# Patient Record
Sex: Male | Born: 1945
Health system: Southern US, Community
[De-identification: ages and names within clinical notes are randomized; demographics above are authoritative.]

## PROBLEM LIST (undated history)

## (undated) DIAGNOSIS — Z8739 Personal history of other diseases of the musculoskeletal system and connective tissue: Secondary | ICD-10-CM

## (undated) DIAGNOSIS — G4733 Obstructive sleep apnea (adult) (pediatric): Secondary | ICD-10-CM

## (undated) DIAGNOSIS — Z9989 Dependence on other enabling machines and devices: Secondary | ICD-10-CM

## (undated) DIAGNOSIS — I10 Essential (primary) hypertension: Secondary | ICD-10-CM

## (undated) DIAGNOSIS — Z8673 Personal history of transient ischemic attack (TIA), and cerebral infarction without residual deficits: Secondary | ICD-10-CM

## (undated) DIAGNOSIS — A770 Spotted fever due to Rickettsia rickettsii: Secondary | ICD-10-CM

## (undated) DIAGNOSIS — G20A1 Parkinson's disease without dyskinesia, without mention of fluctuations: Secondary | ICD-10-CM

## (undated) DIAGNOSIS — E271 Primary adrenocortical insufficiency: Secondary | ICD-10-CM

## (undated) DIAGNOSIS — E786 Lipoprotein deficiency: Secondary | ICD-10-CM

## (undated) DIAGNOSIS — N451 Epididymitis: Secondary | ICD-10-CM

## (undated) DIAGNOSIS — E039 Hypothyroidism, unspecified: Secondary | ICD-10-CM

## (undated) DIAGNOSIS — G8929 Other chronic pain: Secondary | ICD-10-CM

## (undated) DIAGNOSIS — Z8709 Personal history of other diseases of the respiratory system: Secondary | ICD-10-CM

## (undated) DIAGNOSIS — R972 Elevated prostate specific antigen [PSA]: Secondary | ICD-10-CM

## (undated) DIAGNOSIS — Z9889 Other specified postprocedural states: Secondary | ICD-10-CM

## (undated) DIAGNOSIS — T8859XA Other complications of anesthesia, initial encounter: Secondary | ICD-10-CM

## (undated) DIAGNOSIS — A4902 Methicillin resistant Staphylococcus aureus infection, unspecified site: Secondary | ICD-10-CM

## (undated) DIAGNOSIS — I5032 Chronic diastolic (congestive) heart failure: Secondary | ICD-10-CM

## (undated) DIAGNOSIS — I219 Acute myocardial infarction, unspecified: Secondary | ICD-10-CM

## (undated) DIAGNOSIS — H269 Unspecified cataract: Secondary | ICD-10-CM

## (undated) DIAGNOSIS — I639 Cerebral infarction, unspecified: Secondary | ICD-10-CM

## (undated) DIAGNOSIS — I2510 Atherosclerotic heart disease of native coronary artery without angina pectoris: Secondary | ICD-10-CM

## (undated) DIAGNOSIS — M545 Low back pain, unspecified: Secondary | ICD-10-CM

## (undated) DIAGNOSIS — R112 Nausea with vomiting, unspecified: Secondary | ICD-10-CM

## (undated) DIAGNOSIS — G20 Parkinson's disease: Secondary | ICD-10-CM

## (undated) DIAGNOSIS — N200 Calculus of kidney: Secondary | ICD-10-CM

## (undated) DIAGNOSIS — C6290 Malignant neoplasm of unspecified testis, unspecified whether descended or undescended: Secondary | ICD-10-CM

## (undated) DIAGNOSIS — J189 Pneumonia, unspecified organism: Secondary | ICD-10-CM

## (undated) DIAGNOSIS — N492 Inflammatory disorders of scrotum: Secondary | ICD-10-CM

## (undated) DIAGNOSIS — M1990 Unspecified osteoarthritis, unspecified site: Secondary | ICD-10-CM

## (undated) DIAGNOSIS — T4145XA Adverse effect of unspecified anesthetic, initial encounter: Secondary | ICD-10-CM

## (undated) HISTORY — PX: PROSTATE SURGERY: SHX751

## (undated) HISTORY — DX: Unspecified cataract: H26.9

## (undated) HISTORY — PX: BACK SURGERY: SHX140

## (undated) HISTORY — DX: Cerebral infarction, unspecified: I63.9

## (undated) HISTORY — DX: Malignant neoplasm of unspecified testis, unspecified whether descended or undescended: C62.90

## (undated) HISTORY — PX: JOINT REPLACEMENT: SHX530

## (undated) HISTORY — DX: Methicillin resistant Staphylococcus aureus infection, unspecified site: A49.02

## (undated) HISTORY — DX: Chronic diastolic (congestive) heart failure: I50.32

## (undated) HISTORY — DX: Personal history of transient ischemic attack (TIA), and cerebral infarction without residual deficits: Z86.73

---

## 1987-07-12 HISTORY — PX: SHOULDER ARTHROSCOPY W/ ROTATOR CUFF REPAIR: SHX2400

## 1998-07-11 HISTORY — PX: EYE SURGERY: SHX253

## 1999-05-07 ENCOUNTER — Encounter: Payer: Self-pay | Admitting: Ophthalmology

## 1999-05-07 ENCOUNTER — Ambulatory Visit (HOSPITAL_COMMUNITY): Admission: RE | Admit: 1999-05-07 | Discharge: 1999-05-08 | Payer: Self-pay | Admitting: Ophthalmology

## 1999-07-12 DIAGNOSIS — I219 Acute myocardial infarction, unspecified: Secondary | ICD-10-CM

## 1999-07-12 HISTORY — PX: CIRCUMCISION: SUR203

## 1999-07-12 HISTORY — PX: CARDIAC CATHETERIZATION: SHX172

## 1999-07-12 HISTORY — DX: Acute myocardial infarction, unspecified: I21.9

## 2000-02-22 ENCOUNTER — Ambulatory Visit (HOSPITAL_COMMUNITY): Admission: RE | Admit: 2000-02-22 | Discharge: 2000-02-22 | Payer: Self-pay | Admitting: Urology

## 2000-03-04 ENCOUNTER — Emergency Department (HOSPITAL_COMMUNITY): Admission: EM | Admit: 2000-03-04 | Discharge: 2000-03-05 | Payer: Self-pay | Admitting: Emergency Medicine

## 2000-03-20 ENCOUNTER — Inpatient Hospital Stay (HOSPITAL_COMMUNITY): Admission: EM | Admit: 2000-03-20 | Discharge: 2000-03-21 | Payer: Self-pay | Admitting: Emergency Medicine

## 2000-03-20 ENCOUNTER — Encounter: Payer: Self-pay | Admitting: Emergency Medicine

## 2002-07-11 HISTORY — PX: FOOT SURGERY: SHX648

## 2004-07-11 HISTORY — PX: TOTAL KNEE ARTHROPLASTY: SHX125

## 2004-12-07 HISTORY — PX: CYSTOSCOPY: SUR368

## 2006-07-11 HISTORY — PX: LUMBAR DISC SURGERY: SHX700

## 2006-11-15 ENCOUNTER — Emergency Department (HOSPITAL_COMMUNITY): Admission: EM | Admit: 2006-11-15 | Discharge: 2006-11-15 | Payer: Self-pay | Admitting: Emergency Medicine

## 2007-03-18 ENCOUNTER — Emergency Department: Payer: Self-pay | Admitting: Emergency Medicine

## 2008-02-22 ENCOUNTER — Encounter: Admission: RE | Admit: 2008-02-22 | Discharge: 2008-02-22 | Payer: Self-pay | Admitting: Orthopaedic Surgery

## 2009-01-26 ENCOUNTER — Encounter: Admission: RE | Admit: 2009-01-26 | Discharge: 2009-01-26 | Payer: Self-pay | Admitting: Orthopaedic Surgery

## 2010-11-08 ENCOUNTER — Other Ambulatory Visit: Payer: Self-pay | Admitting: Orthopedic Surgery

## 2010-11-08 DIAGNOSIS — M545 Low back pain, unspecified: Secondary | ICD-10-CM

## 2010-11-15 ENCOUNTER — Ambulatory Visit
Admission: RE | Admit: 2010-11-15 | Discharge: 2010-11-15 | Disposition: A | Discharge status: Home / Self Care | Payer: Medicare Other | Source: Ambulatory Visit | Attending: Orthopedic Surgery | Admitting: Orthopedic Surgery

## 2010-11-15 DIAGNOSIS — M545 Low back pain, unspecified: Secondary | ICD-10-CM

## 2010-11-15 MED ORDER — GADOBENATE DIMEGLUMINE 529 MG/ML IV SOLN
20.0000 mL | Freq: Once | INTRAVENOUS | Status: AC | PRN
  Administered 2010-11-15: 20 mL via INTRAVENOUS

## 2010-11-26 NOTE — Op Note (Signed)
[facility]  Patient:    MAAZ, SPIERING                     MRN: [id number] Proc. Date: 02/22/00 Adm. Date:  [id number] Attending:  Evlyn Clines CC:         Arvella Merles, M.D.   Operative Report  PREOPERATIVE DIAGNOSIS:  Phimosis.  POSTOPERATIVE DIAGNOSIS:  Phimosis.  OPERATION PERFORMED:  Circumcision.  SURGEON:  Excell Seltzer. Annabell Howells, M.D.  ANESTHESIA:  Local and MAC.  COMPLICATIONS:  None.  BRIEF HISTORY:  Mr. Valladolid is a 65 year old white male who presented with phimosis.  He has elected circumcision.  DESCRIPTION OF PROCEDURE:  The patient was taken to the operating room where he was placed on the table in the supine position.  His genitalia was prepped with Betadine solution and he was draped in the usual sterile fashion.  A local block of the penis was performed using 20 cc of 50%, 1% lidocaine and 0.25% Marcaine.  He was given IV sedation as needed.  A sleeve reduction of the redundant foreskin was performed.  There was a little frenula chordee that required lysis as well.  Once the redundant skin had been excised, the frenular area was closed using a figure-of-eight mattress suture.  Hemostasis was then assured.  The Bovie was used to cauterize several small blood vessels.  The skin edges were then reapproximated using a running 4-0 chromic.  Once the circumcision had been completed, inspection revealed excellent residual skin.  The penis was cleansed.  A dressing of Xeroform, 4 x 4 and Coban was applied.  The patient was taken to the recovery room in stable condition.  There were no complications. DD:  02/22/00 TD:  02/22/00 Job: [id number]

## 2010-11-26 NOTE — Cardiovascular Report (Signed)
[place]  Patient:    Dennis [name], Dennis [name]                     MRN: [id number] Proc. Date: 03/20/00 Adm. Date:  [id number] Attending:  Lenoria Farrier CC:         Arvella Merles, M.D.  Cecil Cranker, M.D. [facility]  Cardiopulmonary Laboratory   Cardiac Catheterization  CLINICAL HISTORY:  Mr. 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 He has no prior history of known heart disease but does have a history of hypertension, hyperlipidemia and a positive family history for coronary disease.  He was awoken at 3 a.m. with substernal chest pain associated with some nausea.  He finally called the fire department and came to the emergency room by EMS.  The pain was relived after three nitroglycerin en route by EMS. His initial ECG showed minimal ST-T change and his initial CKs and troponins were negative.  DESCRIPTION OF PROCEDURE:  The procedure was performed via the right femoral artery using an arterial sheath and 6 French preformed coronary catheters.  A front wall arterial puncture was performed and Omnipaque contrast was used.  A distal aortogram was performed to rule out abdominal aortic aneurysm. At the completion of the procedure, we attempted to close the right femoral artery with Perclose but were unable to get the suture to hold.  We cut the suture as deep as possible and used manual pressure.  The patient tolerated the procedure well and left the laboratory in satisfactory condition. Ancef was given at the end of the procedure for infection prophylaxis with Perclose.  RESULTS:  The left main coronary artery:  The left main coronary artery was free of significant disease.  Left anterior descending:  The left anterior descending artery gave rise to an optimal diagonal branch and a second diagonal branch and septal perforators. There was 50-70%  narrowing after the second diagonal branch.  The rest of the vessel was irregularities but there was no significant obstruction.  Circumflex artery:  The circumflex artery gave rise to a marginal branch and two posterolateral branches.  These vessels were free of significant disease.  Right coronary artery:  The right coronary gave rise to two right ventricular branches, and two posterior descending branches.  There were tandem 40% lesions in the mid right coronary artery.  LEFT VENTRICULOGRAM:  The left ventriculogram performed in the RAO projection showed good wall motion with no areas of hypokinesis.  The estimated ejection fraction was 60%.  LEFT VENTRICULOGRAM:  A left ventriculogram performed in the LAO projection showed good wall motion with no areas of hypokinesis.  DISTAL AORTOGRAM:  The distal aortogram showed no significant aortoiliac obstruction.  The ostium of the renal arteries was not well-visualized.  CONCLUSIONS:  Nonobstructive coronary artery disease with 50-70% narrowing in the proximal and the mid left anterior descending and tandem 40% stenosis in the mid right coronary artery with normal endothelia dysfunction or spasm.  We will plan to treat him with Altace and nitrates and aspirin and secondary prevention.  We will keep him today and plan discharge tomorrow. DD:  03/20/00 TD:  03/20/00 Job: [id number]

## 2011-05-03 ENCOUNTER — Ambulatory Visit: Payer: Self-pay | Admitting: Gastroenterology

## 2011-05-03 LAB — HM COLONOSCOPY

## 2011-05-06 LAB — PATHOLOGY REPORT

## 2011-05-18 ENCOUNTER — Ambulatory Visit: Payer: Self-pay | Admitting: Internal Medicine

## 2011-05-19 ENCOUNTER — Inpatient Hospital Stay: Payer: Self-pay | Admitting: Specialist

## 2011-05-26 ENCOUNTER — Ambulatory Visit: Payer: Self-pay | Admitting: Internal Medicine

## 2011-05-26 ENCOUNTER — Other Ambulatory Visit: Payer: Self-pay | Admitting: Specialist

## 2011-06-24 ENCOUNTER — Emergency Department: Payer: Self-pay | Admitting: Unknown Physician Specialty

## 2011-08-03 DIAGNOSIS — G4730 Sleep apnea, unspecified: Secondary | ICD-10-CM | POA: Diagnosis not present

## 2011-08-03 DIAGNOSIS — G4710 Hypersomnia, unspecified: Secondary | ICD-10-CM | POA: Diagnosis not present

## 2011-08-08 DIAGNOSIS — N529 Male erectile dysfunction, unspecified: Secondary | ICD-10-CM | POA: Diagnosis not present

## 2011-08-08 DIAGNOSIS — N390 Urinary tract infection, site not specified: Secondary | ICD-10-CM | POA: Diagnosis not present

## 2011-08-08 DIAGNOSIS — N401 Enlarged prostate with lower urinary tract symptoms: Secondary | ICD-10-CM | POA: Diagnosis not present

## 2011-08-08 DIAGNOSIS — E291 Testicular hypofunction: Secondary | ICD-10-CM | POA: Diagnosis not present

## 2011-08-11 DIAGNOSIS — M25569 Pain in unspecified knee: Secondary | ICD-10-CM | POA: Diagnosis not present

## 2011-08-11 DIAGNOSIS — M25469 Effusion, unspecified knee: Secondary | ICD-10-CM | POA: Diagnosis not present

## 2011-08-11 DIAGNOSIS — M1710 Unilateral primary osteoarthritis, unspecified knee: Secondary | ICD-10-CM | POA: Diagnosis not present

## 2011-08-16 DIAGNOSIS — M1710 Unilateral primary osteoarthritis, unspecified knee: Secondary | ICD-10-CM | POA: Diagnosis not present

## 2011-08-16 DIAGNOSIS — N3941 Urge incontinence: Secondary | ICD-10-CM | POA: Diagnosis not present

## 2011-08-16 DIAGNOSIS — N401 Enlarged prostate with lower urinary tract symptoms: Secondary | ICD-10-CM | POA: Diagnosis not present

## 2011-08-16 DIAGNOSIS — N35919 Unspecified urethral stricture, male, unspecified site: Secondary | ICD-10-CM | POA: Diagnosis not present

## 2011-08-16 DIAGNOSIS — N390 Urinary tract infection, site not specified: Secondary | ICD-10-CM | POA: Diagnosis not present

## 2011-09-19 DIAGNOSIS — J209 Acute bronchitis, unspecified: Secondary | ICD-10-CM | POA: Diagnosis not present

## 2011-09-19 DIAGNOSIS — J019 Acute sinusitis, unspecified: Secondary | ICD-10-CM | POA: Diagnosis not present

## 2011-11-01 DIAGNOSIS — IMO0002 Reserved for concepts with insufficient information to code with codable children: Secondary | ICD-10-CM | POA: Diagnosis not present

## 2011-11-01 DIAGNOSIS — M545 Low back pain, unspecified: Secondary | ICD-10-CM | POA: Diagnosis not present

## 2011-11-02 DIAGNOSIS — E782 Mixed hyperlipidemia: Secondary | ICD-10-CM | POA: Diagnosis not present

## 2011-11-02 DIAGNOSIS — F329 Major depressive disorder, single episode, unspecified: Secondary | ICD-10-CM | POA: Diagnosis not present

## 2011-11-02 DIAGNOSIS — I10 Essential (primary) hypertension: Secondary | ICD-10-CM | POA: Diagnosis not present

## 2011-11-02 DIAGNOSIS — M109 Gout, unspecified: Secondary | ICD-10-CM | POA: Diagnosis not present

## 2011-11-02 DIAGNOSIS — E039 Hypothyroidism, unspecified: Secondary | ICD-10-CM | POA: Diagnosis not present

## 2011-11-02 DIAGNOSIS — G4710 Hypersomnia, unspecified: Secondary | ICD-10-CM | POA: Diagnosis not present

## 2011-11-09 DIAGNOSIS — G4710 Hypersomnia, unspecified: Secondary | ICD-10-CM | POA: Diagnosis not present

## 2011-11-15 DIAGNOSIS — IMO0002 Reserved for concepts with insufficient information to code with codable children: Secondary | ICD-10-CM | POA: Diagnosis not present

## 2011-11-15 DIAGNOSIS — M545 Low back pain, unspecified: Secondary | ICD-10-CM | POA: Diagnosis not present

## 2011-11-19 DIAGNOSIS — Z91013 Allergy to seafood: Secondary | ICD-10-CM | POA: Diagnosis not present

## 2011-11-19 DIAGNOSIS — Z91038 Other insect allergy status: Secondary | ICD-10-CM | POA: Diagnosis not present

## 2011-11-19 DIAGNOSIS — I2510 Atherosclerotic heart disease of native coronary artery without angina pectoris: Secondary | ICD-10-CM | POA: Diagnosis not present

## 2011-11-19 DIAGNOSIS — F329 Major depressive disorder, single episode, unspecified: Secondary | ICD-10-CM | POA: Diagnosis not present

## 2011-11-19 DIAGNOSIS — T781XXA Other adverse food reactions, not elsewhere classified, initial encounter: Secondary | ICD-10-CM | POA: Diagnosis not present

## 2011-11-19 DIAGNOSIS — E039 Hypothyroidism, unspecified: Secondary | ICD-10-CM | POA: Diagnosis not present

## 2011-11-19 DIAGNOSIS — L272 Dermatitis due to ingested food: Secondary | ICD-10-CM | POA: Diagnosis not present

## 2011-11-19 DIAGNOSIS — I252 Old myocardial infarction: Secondary | ICD-10-CM | POA: Diagnosis not present

## 2011-11-19 DIAGNOSIS — F411 Generalized anxiety disorder: Secondary | ICD-10-CM | POA: Diagnosis not present

## 2011-11-19 DIAGNOSIS — R220 Localized swelling, mass and lump, head: Secondary | ICD-10-CM | POA: Diagnosis not present

## 2011-11-19 DIAGNOSIS — I10 Essential (primary) hypertension: Secondary | ICD-10-CM | POA: Diagnosis not present

## 2011-12-23 DIAGNOSIS — N35919 Unspecified urethral stricture, male, unspecified site: Secondary | ICD-10-CM | POA: Diagnosis not present

## 2011-12-23 DIAGNOSIS — N401 Enlarged prostate with lower urinary tract symptoms: Secondary | ICD-10-CM | POA: Diagnosis not present

## 2011-12-23 DIAGNOSIS — N3941 Urge incontinence: Secondary | ICD-10-CM | POA: Diagnosis not present

## 2011-12-23 DIAGNOSIS — R351 Nocturia: Secondary | ICD-10-CM | POA: Diagnosis not present

## 2011-12-23 DIAGNOSIS — N390 Urinary tract infection, site not specified: Secondary | ICD-10-CM | POA: Diagnosis not present

## 2011-12-24 ENCOUNTER — Emergency Department: Payer: Self-pay | Admitting: Emergency Medicine

## 2011-12-24 DIAGNOSIS — S99929A Unspecified injury of unspecified foot, initial encounter: Secondary | ICD-10-CM | POA: Diagnosis not present

## 2011-12-24 DIAGNOSIS — N3000 Acute cystitis without hematuria: Secondary | ICD-10-CM | POA: Diagnosis not present

## 2011-12-24 DIAGNOSIS — M25579 Pain in unspecified ankle and joints of unspecified foot: Secondary | ICD-10-CM | POA: Diagnosis not present

## 2011-12-24 DIAGNOSIS — S8990XA Unspecified injury of unspecified lower leg, initial encounter: Secondary | ICD-10-CM | POA: Diagnosis not present

## 2011-12-24 DIAGNOSIS — N3090 Cystitis, unspecified without hematuria: Secondary | ICD-10-CM | POA: Diagnosis not present

## 2011-12-24 DIAGNOSIS — S93409A Sprain of unspecified ligament of unspecified ankle, initial encounter: Secondary | ICD-10-CM | POA: Diagnosis not present

## 2011-12-24 LAB — URINALYSIS, COMPLETE
Bacteria: NONE SEEN
Bilirubin,UR: NEGATIVE
Glucose,UR: NEGATIVE mg/dL
Ketone: NEGATIVE
Nitrite: NEGATIVE
Ph: 7
Protein: 100
RBC,UR: 370 /HPF
Specific Gravity: 1.014
Squamous Epithelial: NONE SEEN
WBC UR: 646 /HPF

## 2011-12-26 DIAGNOSIS — S96819A Strain of other specified muscles and tendons at ankle and foot level, unspecified foot, initial encounter: Secondary | ICD-10-CM | POA: Diagnosis not present

## 2011-12-26 DIAGNOSIS — M25469 Effusion, unspecified knee: Secondary | ICD-10-CM | POA: Diagnosis not present

## 2011-12-26 DIAGNOSIS — Z79899 Other long term (current) drug therapy: Secondary | ICD-10-CM | POA: Diagnosis not present

## 2011-12-26 DIAGNOSIS — S93409A Sprain of unspecified ligament of unspecified ankle, initial encounter: Secondary | ICD-10-CM | POA: Diagnosis not present

## 2011-12-26 DIAGNOSIS — R079 Chest pain, unspecified: Secondary | ICD-10-CM | POA: Diagnosis not present

## 2011-12-26 DIAGNOSIS — Z8679 Personal history of other diseases of the circulatory system: Secondary | ICD-10-CM | POA: Diagnosis not present

## 2011-12-26 DIAGNOSIS — R0602 Shortness of breath: Secondary | ICD-10-CM | POA: Diagnosis not present

## 2011-12-26 DIAGNOSIS — M7989 Other specified soft tissue disorders: Secondary | ICD-10-CM | POA: Diagnosis not present

## 2011-12-26 DIAGNOSIS — S93499A Sprain of other ligament of unspecified ankle, initial encounter: Secondary | ICD-10-CM | POA: Diagnosis not present

## 2011-12-26 DIAGNOSIS — M79609 Pain in unspecified limb: Secondary | ICD-10-CM | POA: Diagnosis not present

## 2011-12-26 DIAGNOSIS — R9431 Abnormal electrocardiogram [ECG] [EKG]: Secondary | ICD-10-CM | POA: Diagnosis not present

## 2011-12-26 DIAGNOSIS — E785 Hyperlipidemia, unspecified: Secondary | ICD-10-CM | POA: Diagnosis not present

## 2011-12-26 DIAGNOSIS — E039 Hypothyroidism, unspecified: Secondary | ICD-10-CM | POA: Diagnosis not present

## 2011-12-26 DIAGNOSIS — M25569 Pain in unspecified knee: Secondary | ICD-10-CM | POA: Diagnosis not present

## 2011-12-26 DIAGNOSIS — R609 Edema, unspecified: Secondary | ICD-10-CM | POA: Diagnosis not present

## 2011-12-26 DIAGNOSIS — I252 Old myocardial infarction: Secondary | ICD-10-CM | POA: Diagnosis not present

## 2011-12-26 DIAGNOSIS — I517 Cardiomegaly: Secondary | ICD-10-CM | POA: Diagnosis not present

## 2011-12-26 DIAGNOSIS — I10 Essential (primary) hypertension: Secondary | ICD-10-CM | POA: Diagnosis not present

## 2011-12-26 LAB — URINE CULTURE

## 2011-12-30 DIAGNOSIS — M25579 Pain in unspecified ankle and joints of unspecified foot: Secondary | ICD-10-CM | POA: Diagnosis not present

## 2011-12-31 DIAGNOSIS — M25579 Pain in unspecified ankle and joints of unspecified foot: Secondary | ICD-10-CM | POA: Diagnosis not present

## 2012-01-02 DIAGNOSIS — S93409A Sprain of unspecified ligament of unspecified ankle, initial encounter: Secondary | ICD-10-CM | POA: Diagnosis not present

## 2012-01-09 DIAGNOSIS — M25579 Pain in unspecified ankle and joints of unspecified foot: Secondary | ICD-10-CM | POA: Diagnosis not present

## 2012-01-17 DIAGNOSIS — M25579 Pain in unspecified ankle and joints of unspecified foot: Secondary | ICD-10-CM | POA: Diagnosis not present

## 2012-01-19 DIAGNOSIS — M25579 Pain in unspecified ankle and joints of unspecified foot: Secondary | ICD-10-CM | POA: Diagnosis not present

## 2012-01-23 ENCOUNTER — Encounter (HOSPITAL_COMMUNITY): Payer: Self-pay | Admitting: Emergency Medicine

## 2012-01-23 ENCOUNTER — Emergency Department (HOSPITAL_COMMUNITY): Payer: Medicare Other

## 2012-01-23 ENCOUNTER — Emergency Department (HOSPITAL_COMMUNITY)
Admission: EM | Admit: 2012-01-23 | Discharge: 2012-01-23 | Disposition: A | Discharge status: Home / Self Care | Payer: Medicare Other | Attending: Emergency Medicine | Admitting: Emergency Medicine

## 2012-01-23 DIAGNOSIS — IMO0002 Reserved for concepts with insufficient information to code with codable children: Secondary | ICD-10-CM | POA: Diagnosis not present

## 2012-01-23 DIAGNOSIS — M79609 Pain in unspecified limb: Secondary | ICD-10-CM | POA: Insufficient documentation

## 2012-01-23 DIAGNOSIS — M549 Dorsalgia, unspecified: Secondary | ICD-10-CM | POA: Diagnosis not present

## 2012-01-23 DIAGNOSIS — R609 Edema, unspecified: Secondary | ICD-10-CM | POA: Diagnosis not present

## 2012-01-23 DIAGNOSIS — Z9889 Other specified postprocedural states: Secondary | ICD-10-CM | POA: Diagnosis not present

## 2012-01-23 DIAGNOSIS — M5126 Other intervertebral disc displacement, lumbar region: Secondary | ICD-10-CM | POA: Diagnosis not present

## 2012-01-23 DIAGNOSIS — M48061 Spinal stenosis, lumbar region without neurogenic claudication: Secondary | ICD-10-CM | POA: Diagnosis not present

## 2012-01-23 DIAGNOSIS — Z79899 Other long term (current) drug therapy: Secondary | ICD-10-CM | POA: Diagnosis not present

## 2012-01-23 DIAGNOSIS — M4800 Spinal stenosis, site unspecified: Secondary | ICD-10-CM | POA: Diagnosis not present

## 2012-01-23 DIAGNOSIS — M5137 Other intervertebral disc degeneration, lumbosacral region: Secondary | ICD-10-CM | POA: Diagnosis not present

## 2012-01-23 DIAGNOSIS — M545 Low back pain, unspecified: Secondary | ICD-10-CM | POA: Diagnosis not present

## 2012-01-23 DIAGNOSIS — M47817 Spondylosis without myelopathy or radiculopathy, lumbosacral region: Secondary | ICD-10-CM | POA: Diagnosis not present

## 2012-01-23 DIAGNOSIS — R209 Unspecified disturbances of skin sensation: Secondary | ICD-10-CM | POA: Diagnosis not present

## 2012-01-23 DIAGNOSIS — G8929 Other chronic pain: Secondary | ICD-10-CM | POA: Insufficient documentation

## 2012-01-23 LAB — CBC WITH DIFFERENTIAL/PLATELET
Basophils Absolute: 0 10*3/uL (ref 0.0–0.1)
Basophils Relative: 0 % (ref 0–1)
Eosinophils Absolute: 0.3 10*3/uL (ref 0.0–0.7)
Eosinophils Relative: 4 % (ref 0–5)
HCT: 37.8 % — ABNORMAL LOW (ref 39.0–52.0)
Hemoglobin: 12.4 g/dL — ABNORMAL LOW (ref 13.0–17.0)
Lymphocytes Relative: 25 % (ref 12–46)
Lymphs Abs: 1.7 10*3/uL (ref 0.7–4.0)
MCH: 26.7 pg (ref 26.0–34.0)
MCHC: 32.8 g/dL (ref 30.0–36.0)
MCV: 81.5 fL (ref 78.0–100.0)
Monocytes Absolute: 0.4 10*3/uL (ref 0.1–1.0)
Monocytes Relative: 6 % (ref 3–12)
Neutro Abs: 4.3 10*3/uL (ref 1.7–7.7)
Neutrophils Relative %: 64 % (ref 43–77)
Platelets: 222 10*3/uL (ref 150–400)
RBC: 4.64 MIL/uL (ref 4.22–5.81)
RDW: 13.4 % (ref 11.5–15.5)
WBC: 6.8 10*3/uL (ref 4.0–10.5)

## 2012-01-23 LAB — COMPREHENSIVE METABOLIC PANEL
ALT: 12 U/L (ref 0–53)
AST: 14 U/L (ref 0–37)
Albumin: 3.7 g/dL (ref 3.5–5.2)
Alkaline Phosphatase: 60 U/L (ref 39–117)
BUN: 13 mg/dL (ref 6–23)
CO2: 28 mEq/L (ref 19–32)
Calcium: 9.5 mg/dL (ref 8.4–10.5)
Chloride: 100 mEq/L (ref 96–112)
Creatinine, Ser: 1.01 mg/dL (ref 0.50–1.35)
GFR calc Af Amer: 87 mL/min — ABNORMAL LOW (ref >90)
GFR calc non Af Amer: 75 mL/min — ABNORMAL LOW (ref >90)
Glucose, Bld: 96 mg/dL (ref 70–99)
Potassium: 3.5 mEq/L (ref 3.5–5.1)
Sodium: 137 mEq/L (ref 135–145)
Total Bilirubin: 0.5 mg/dL (ref 0.3–1.2)
Total Protein: 7.6 g/dL (ref 6.0–8.3)

## 2012-01-23 LAB — PROTIME-INR
INR: 1.09 (ref 0.00–1.49)
Prothrombin Time: 14.3 seconds (ref 11.6–15.2)

## 2012-01-23 MED ORDER — GADOBENATE DIMEGLUMINE 529 MG/ML IV SOLN
20.0000 mL | Freq: Once | INTRAVENOUS | Status: AC | PRN
  Administered 2012-01-23: 20 mL via INTRAVENOUS

## 2012-01-23 MED ORDER — HYDROMORPHONE HCL PF 1 MG/ML IJ SOLN
1.0000 mg | Freq: Once | INTRAMUSCULAR | Status: AC
  Administered 2012-01-23: 1 mg via INTRAVENOUS
  Filled 2012-01-23: qty 1

## 2012-01-23 MED ORDER — ONDANSETRON HCL 4 MG/2ML IJ SOLN
4.0000 mg | Freq: Once | INTRAMUSCULAR | Status: AC
  Administered 2012-01-23: 4 mg via INTRAVENOUS
  Filled 2012-01-23: qty 2

## 2012-01-23 NOTE — ED Notes (Signed)
Pt returned from MRI °

## 2012-01-23 NOTE — ED Notes (Signed)
Pt c/o increasing pain - unbearable.  VS stable.

## 2012-01-23 NOTE — ED Provider Notes (Signed)
History     CSN: [id number]  Arrival date & time 01/23/12  1805   First MD Initiated Contact with Patient 01/23/12 2042      Chief Complaint  Patient presents with  . Back Pain  . Leg Pain    (Consider location/radiation/quality/duration/timing/severity/associated sxs/prior treatment) HPI This 28 rolled male presents with concerns of decreasing sensation in his lower legs, increasing weakness, indicate instability.  He has a history of degenerative joint disease, as well as prior spinal surgeries, including resection of multiple discs and prior disc herniation. He notes that over the past 2 or 3 weeks he is increased bilateral weakness, greater on the left.  Concurrently he has decreased sensation and an inability to move the toes on the right.  He notes new incontinence, both of fecal and urine.  He denies any ongoing fevers, chills, abdominal pain, chest pain, dyspnea.  He does complain of generalized fatigue.  His symptoms are most pronounced when trying to walk or perform physical therapy.  He is in physical therapy for a prior right foot injury. Past Medical History  Diagnosis Date  . Chronic back pain     History reviewed. No pertinent past surgical history.  History reviewed. No pertinent family history.  History  Substance Use Topics  . Smoking status: Never Smoker   . Smokeless tobacco: Not on file  . Alcohol Use: No      Review of Systems  Constitutional:       Per HPI, otherwise negative  HENT:       Per HPI, otherwise negative  Eyes: Negative.   Respiratory:       Per HPI, otherwise negative  Cardiovascular:       Per HPI, otherwise negative  Gastrointestinal: Negative for vomiting.  Genitourinary: Negative.   Musculoskeletal:       Per HPI, otherwise negative  Skin: Negative.   Neurological: Negative for syncope.    Allergies  Shrimp  Home Medications   Current Outpatient Rx  Name Route Sig Dispense Refill  . ATORVASTATIN CALCIUM 20 MG PO  TABS Oral Take 20 mg by mouth daily.    Marland Kitchen CARVEDILOL 3.125 MG PO TABS Oral Take 3.125 mg by mouth 2 (two) times daily with a meal.    . COLCHICINE 0.6 MG PO TABS Oral Take 0.6 mg by mouth 2 (two) times daily.    Marland Kitchen ESCITALOPRAM OXALATE 20 MG PO TABS Oral Take 20 mg by mouth daily.    Marland Kitchen LEVOTHYROXINE SODIUM 100 MCG PO TABS Oral Take 100 mcg by mouth daily.    Marland Kitchen LEVOTHYROXINE SODIUM 75 MCG PO TABS Oral Take 75 mcg by mouth daily.    Marland Kitchen LISINOPRIL 40 MG PO TABS Oral Take 40 mg by mouth daily.    . OXYCODONE-ACETAMINOPHEN 10-325 MG PO TABS Oral Take 1 tablet by mouth every 4 (four) hours as needed. For pain    . SPIRONOLACTONE 25 MG PO TABS Oral Take 25 mg by mouth daily.      BP 159/86  Pulse 59  Temp 98.5 F (36.9 C) (Oral)  Resp 16  SpO2 100%  Physical Exam  Constitutional: No distress.       Large male resting in bed  HENT:  Head: Normocephalic and atraumatic.  Eyes: Conjunctivae are normal.  Cardiovascular: Normal rate and regular rhythm.   Pulmonary/Chest: Effort normal. No stridor. No respiratory distress.  Abdominal: Soft. He exhibits no distension.  Genitourinary:       Patient has rectal tone  is appropriate with pain on exam.  Musculoskeletal:       Feet:  Neurological:       In the upper extremities, and distorts the patient has appropriate neurovascular status.  In the lower terminates the patient has both decreased strength and sensation in the right greater than the left lower extremity.  He does have appropriate pulses in both lower extremities  Skin: He is not diaphoretic.    ED Course  Procedures (including critical care time)   Labs Reviewed  CBC WITH DIFFERENTIAL  COMPREHENSIVE METABOLIC PANEL  PROTIME-INR   No results found.   No diagnosis found.    MDM   this large 41 rolled male with a history of prior lower back pathology now presents with concerns of ongoing weakness, ataxia, decreased sensation and possible incontinence.  Given the patient's  history, and these new changes, there is concern for cauda equina or other acutely degenerative process.  The patient had an expeditious MRI which was relatively stable compared to one performed 2 months ago.  The patient has a followup capacity with neurosurgery.  He was discharged in stable condition followup tomorrow.  Given these findings, the stable vital signs and is normal laboratory evaluation there is low suspicion for acute current process.    Gerhard Munch, MD 01/24/12 [id number]

## 2012-01-23 NOTE — ED Notes (Signed)
Pt here for MRI of back; pt having increasing pain to lower legs x several weeks with difficulty walking; pt told to come by spinal PCP; pt sts increased numbness and weakness over last several weeks; pt with hx of back problems and cyst on spine

## 2012-01-23 NOTE — ED Notes (Signed)
Pt. C/o of back pain. Pt. Reports L4, L5 discs removed. And has crushed discs above that. Was told today that he has a cyst on his spine as well. Pt. Twisted right ankle over 1 month ago. Has decreased sensory loss and weakness in legs and feet.

## 2012-01-24 ENCOUNTER — Other Ambulatory Visit: Payer: Self-pay | Admitting: Orthopaedic Surgery

## 2012-01-24 DIAGNOSIS — M549 Dorsalgia, unspecified: Secondary | ICD-10-CM

## 2012-01-26 DIAGNOSIS — M545 Low back pain, unspecified: Secondary | ICD-10-CM | POA: Diagnosis not present

## 2012-01-26 DIAGNOSIS — IMO0002 Reserved for concepts with insufficient information to code with codable children: Secondary | ICD-10-CM | POA: Diagnosis not present

## 2012-01-26 DIAGNOSIS — M5126 Other intervertebral disc displacement, lumbar region: Secondary | ICD-10-CM | POA: Diagnosis not present

## 2012-01-26 DIAGNOSIS — M47817 Spondylosis without myelopathy or radiculopathy, lumbosacral region: Secondary | ICD-10-CM | POA: Diagnosis not present

## 2012-01-26 DIAGNOSIS — M79609 Pain in unspecified limb: Secondary | ICD-10-CM | POA: Diagnosis not present

## 2012-01-27 DIAGNOSIS — M5137 Other intervertebral disc degeneration, lumbosacral region: Secondary | ICD-10-CM | POA: Diagnosis not present

## 2012-01-27 DIAGNOSIS — R609 Edema, unspecified: Secondary | ICD-10-CM | POA: Diagnosis not present

## 2012-01-27 DIAGNOSIS — I10 Essential (primary) hypertension: Secondary | ICD-10-CM | POA: Diagnosis not present

## 2012-01-27 DIAGNOSIS — R5381 Other malaise: Secondary | ICD-10-CM | POA: Diagnosis not present

## 2012-01-27 DIAGNOSIS — M109 Gout, unspecified: Secondary | ICD-10-CM | POA: Diagnosis not present

## 2012-01-27 DIAGNOSIS — M545 Low back pain, unspecified: Secondary | ICD-10-CM | POA: Diagnosis not present

## 2012-01-31 DIAGNOSIS — I6350 Cerebral infarction due to unspecified occlusion or stenosis of unspecified cerebral artery: Secondary | ICD-10-CM | POA: Diagnosis not present

## 2012-01-31 DIAGNOSIS — I10 Essential (primary) hypertension: Secondary | ICD-10-CM | POA: Diagnosis not present

## 2012-01-31 DIAGNOSIS — M79609 Pain in unspecified limb: Secondary | ICD-10-CM | POA: Diagnosis not present

## 2012-01-31 DIAGNOSIS — M7989 Other specified soft tissue disorders: Secondary | ICD-10-CM | POA: Diagnosis not present

## 2012-02-02 DIAGNOSIS — M5137 Other intervertebral disc degeneration, lumbosacral region: Secondary | ICD-10-CM | POA: Diagnosis not present

## 2012-02-02 DIAGNOSIS — R609 Edema, unspecified: Secondary | ICD-10-CM | POA: Diagnosis not present

## 2012-02-02 DIAGNOSIS — E039 Hypothyroidism, unspecified: Secondary | ICD-10-CM | POA: Diagnosis not present

## 2012-02-02 DIAGNOSIS — M109 Gout, unspecified: Secondary | ICD-10-CM | POA: Diagnosis not present

## 2012-02-03 DIAGNOSIS — M545 Low back pain, unspecified: Secondary | ICD-10-CM | POA: Diagnosis not present

## 2012-02-03 DIAGNOSIS — IMO0002 Reserved for concepts with insufficient information to code with codable children: Secondary | ICD-10-CM | POA: Diagnosis not present

## 2012-02-07 DIAGNOSIS — M79609 Pain in unspecified limb: Secondary | ICD-10-CM | POA: Diagnosis not present

## 2012-02-07 DIAGNOSIS — M7989 Other specified soft tissue disorders: Secondary | ICD-10-CM | POA: Diagnosis not present

## 2012-02-07 DIAGNOSIS — M48061 Spinal stenosis, lumbar region without neurogenic claudication: Secondary | ICD-10-CM | POA: Diagnosis not present

## 2012-02-07 DIAGNOSIS — IMO0002 Reserved for concepts with insufficient information to code with codable children: Secondary | ICD-10-CM | POA: Diagnosis not present

## 2012-02-13 DIAGNOSIS — I2510 Atherosclerotic heart disease of native coronary artery without angina pectoris: Secondary | ICD-10-CM | POA: Diagnosis not present

## 2012-02-13 DIAGNOSIS — G4733 Obstructive sleep apnea (adult) (pediatric): Secondary | ICD-10-CM | POA: Diagnosis not present

## 2012-02-13 DIAGNOSIS — Z01810 Encounter for preprocedural cardiovascular examination: Secondary | ICD-10-CM | POA: Diagnosis not present

## 2012-02-13 DIAGNOSIS — I10 Essential (primary) hypertension: Secondary | ICD-10-CM | POA: Diagnosis not present

## 2012-02-14 DIAGNOSIS — IMO0002 Reserved for concepts with insufficient information to code with codable children: Secondary | ICD-10-CM | POA: Diagnosis not present

## 2012-02-21 DIAGNOSIS — M5430 Sciatica, unspecified side: Secondary | ICD-10-CM | POA: Diagnosis not present

## 2012-02-21 DIAGNOSIS — R0789 Other chest pain: Secondary | ICD-10-CM | POA: Diagnosis not present

## 2012-02-21 DIAGNOSIS — Z01811 Encounter for preprocedural respiratory examination: Secondary | ICD-10-CM | POA: Diagnosis not present

## 2012-02-21 DIAGNOSIS — Z01818 Encounter for other preprocedural examination: Secondary | ICD-10-CM | POA: Diagnosis not present

## 2012-02-22 ENCOUNTER — Other Ambulatory Visit: Payer: Self-pay | Admitting: Neurological Surgery

## 2012-02-23 ENCOUNTER — Encounter (HOSPITAL_COMMUNITY): Payer: Self-pay | Admitting: Respiratory Therapy

## 2012-03-02 ENCOUNTER — Ambulatory Visit (HOSPITAL_COMMUNITY)
Admission: RE | Admit: 2012-03-02 | Discharge: 2012-03-02 | Disposition: A | Discharge status: Home / Self Care | Payer: Medicare Other | Source: Ambulatory Visit | Attending: Neurological Surgery | Admitting: Neurological Surgery

## 2012-03-02 ENCOUNTER — Encounter (HOSPITAL_COMMUNITY): Payer: Self-pay

## 2012-03-02 ENCOUNTER — Encounter (HOSPITAL_COMMUNITY)
Admission: RE | Admit: 2012-03-02 | Discharge: 2012-03-02 | Disposition: A | Discharge status: Home / Self Care | Payer: Medicare Other | Source: Ambulatory Visit | Attending: Neurological Surgery | Admitting: Neurological Surgery

## 2012-03-02 DIAGNOSIS — Z01812 Encounter for preprocedural laboratory examination: Secondary | ICD-10-CM | POA: Diagnosis not present

## 2012-03-02 DIAGNOSIS — Z01818 Encounter for other preprocedural examination: Secondary | ICD-10-CM | POA: Insufficient documentation

## 2012-03-02 DIAGNOSIS — Z01811 Encounter for preprocedural respiratory examination: Secondary | ICD-10-CM | POA: Diagnosis not present

## 2012-03-02 HISTORY — DX: Other complications of anesthesia, initial encounter: T88.59XA

## 2012-03-02 HISTORY — DX: Acute myocardial infarction, unspecified: I21.9

## 2012-03-02 HISTORY — DX: Unspecified osteoarthritis, unspecified site: M19.90

## 2012-03-02 HISTORY — DX: Essential (primary) hypertension: I10

## 2012-03-02 HISTORY — DX: Nausea with vomiting, unspecified: R11.2

## 2012-03-02 HISTORY — DX: Adverse effect of unspecified anesthetic, initial encounter: T41.45XA

## 2012-03-02 HISTORY — DX: Other specified postprocedural states: Z98.890

## 2012-03-02 HISTORY — DX: Hypothyroidism, unspecified: E03.9

## 2012-03-02 LAB — BASIC METABOLIC PANEL
BUN: 13 mg/dL (ref 6–23)
CO2: 29 mEq/L (ref 19–32)
Calcium: 9.9 mg/dL (ref 8.4–10.5)
Chloride: 99 mEq/L (ref 96–112)
Creatinine, Ser: 1.05 mg/dL (ref 0.50–1.35)
GFR calc Af Amer: 83 mL/min — ABNORMAL LOW (ref >90)
GFR calc non Af Amer: 72 mL/min — ABNORMAL LOW (ref >90)
Glucose, Bld: 113 mg/dL — ABNORMAL HIGH (ref 70–99)
Potassium: 3 mEq/L — ABNORMAL LOW (ref 3.5–5.1)
Sodium: 139 mEq/L (ref 135–145)

## 2012-03-02 LAB — CBC WITH DIFFERENTIAL/PLATELET
Basophils Absolute: 0 10*3/uL (ref 0.0–0.1)
Basophils Relative: 1 % (ref 0–1)
Eosinophils Absolute: 0.3 10*3/uL (ref 0.0–0.7)
Eosinophils Relative: 5 % (ref 0–5)
HCT: 42.6 % (ref 39.0–52.0)
Hemoglobin: 14.3 g/dL (ref 13.0–17.0)
Lymphocytes Relative: 24 % (ref 12–46)
Lymphs Abs: 1.5 10*3/uL (ref 0.7–4.0)
MCH: 27.4 pg (ref 26.0–34.0)
MCHC: 33.6 g/dL (ref 30.0–36.0)
MCV: 81.6 fL (ref 78.0–100.0)
Monocytes Absolute: 0.3 10*3/uL (ref 0.1–1.0)
Monocytes Relative: 5 % (ref 3–12)
Neutro Abs: 4.1 10*3/uL (ref 1.7–7.7)
Neutrophils Relative %: 66 % (ref 43–77)
Platelets: 197 10*3/uL (ref 150–400)
RBC: 5.22 MIL/uL (ref 4.22–5.81)
RDW: 14.1 % (ref 11.5–15.5)
WBC: 6.2 10*3/uL (ref 4.0–10.5)

## 2012-03-02 LAB — SURGICAL PCR SCREEN
MRSA, PCR: NEGATIVE
Staphylococcus aureus: NEGATIVE

## 2012-03-02 LAB — TYPE AND SCREEN
ABO/RH(D): O POS
Antibody Screen: NEGATIVE

## 2012-03-02 LAB — PROTIME-INR
INR: 1.04 (ref 0.00–1.49)
Prothrombin Time: 13.8 seconds (ref 11.6–15.2)

## 2012-03-02 LAB — ABO/RH: ABO/RH(D): O POS

## 2012-03-02 NOTE — Pre-Procedure Instructions (Signed)
20 Dennis [name]  03/02/2012   Your procedure is scheduled on:  03/09/2012  Report to [facility] at 5:30 AM.  Call this number if you have problems the morning of surgery: [phone]   Remember:   Do not eat food or drink liquids:After Midnight.   THURSDAY      Take these medicines the morning of surgery with A SIP OF WATER: Carvedilol, lexapro, synthroid   Do not wear jewelry  Do not wear lotions, powders, or perfumes. You may wear deodorant.   Men may shave face and neck.  Do not bring valuables to the hospital.  Contacts, dentures or bridgework may not be worn into surgery.  Leave suitcase in the car. After surgery it may be brought to your room.  For patients admitted to the hospital, checkout time is 11:00 AM the day of discharge.   Patients discharged the day of surgery will not be allowed to drive home.  Name and phone number of your driver: /w family  Special Instructions: CHG Shower Use Special Wash: 1/2 bottle night before surgery and 1/2 bottle morning of surgery.   Please read over the following fact sheets that you were given: Pain Booklet, Coughing and Deep Breathing, Blood Transfusion Information, MRSA Information and Surgical Site Infection Prevention

## 2012-03-08 MED ORDER — DEXTROSE 5 % IV SOLN
3.0000 g | INTRAVENOUS | Status: AC
  Administered 2012-03-09: 3 g via INTRAVENOUS
  Filled 2012-03-08: qty 3000

## 2012-03-09 ENCOUNTER — Encounter (HOSPITAL_COMMUNITY): Payer: Self-pay | Admitting: Anesthesiology

## 2012-03-09 ENCOUNTER — Encounter (HOSPITAL_COMMUNITY)
Admission: RE | Disposition: A | Discharge status: Home / Self Care | Payer: Self-pay | Source: Ambulatory Visit | Attending: Neurological Surgery

## 2012-03-09 ENCOUNTER — Encounter (HOSPITAL_COMMUNITY): Payer: Self-pay | Admitting: Neurological Surgery

## 2012-03-09 ENCOUNTER — Encounter (HOSPITAL_COMMUNITY): Payer: Self-pay | Admitting: *Deleted

## 2012-03-09 ENCOUNTER — Inpatient Hospital Stay (HOSPITAL_COMMUNITY): Payer: Medicare Other | Admitting: Anesthesiology

## 2012-03-09 ENCOUNTER — Inpatient Hospital Stay (HOSPITAL_COMMUNITY)
Admission: RE | Admit: 2012-03-09 | Discharge: 2012-03-11 | DRG: 455 | Disposition: A | Discharge status: Home / Self Care | Payer: Medicare Other | Source: Ambulatory Visit | Attending: Neurological Surgery | Admitting: Neurological Surgery

## 2012-03-09 ENCOUNTER — Inpatient Hospital Stay (HOSPITAL_COMMUNITY): Payer: Medicare Other

## 2012-03-09 DIAGNOSIS — M47817 Spondylosis without myelopathy or radiculopathy, lumbosacral region: Secondary | ICD-10-CM | POA: Diagnosis not present

## 2012-03-09 DIAGNOSIS — G8929 Other chronic pain: Secondary | ICD-10-CM | POA: Diagnosis present

## 2012-03-09 DIAGNOSIS — F329 Major depressive disorder, single episode, unspecified: Secondary | ICD-10-CM | POA: Diagnosis present

## 2012-03-09 DIAGNOSIS — I252 Old myocardial infarction: Secondary | ICD-10-CM

## 2012-03-09 DIAGNOSIS — Z79899 Other long term (current) drug therapy: Secondary | ICD-10-CM

## 2012-03-09 DIAGNOSIS — M961 Postlaminectomy syndrome, not elsewhere classified: Secondary | ICD-10-CM | POA: Diagnosis not present

## 2012-03-09 DIAGNOSIS — M7130 Other bursal cyst, unspecified site: Secondary | ICD-10-CM | POA: Diagnosis present

## 2012-03-09 DIAGNOSIS — Z96659 Presence of unspecified artificial knee joint: Secondary | ICD-10-CM | POA: Diagnosis not present

## 2012-03-09 DIAGNOSIS — M5126 Other intervertebral disc displacement, lumbar region: Principal | ICD-10-CM | POA: Diagnosis present

## 2012-03-09 DIAGNOSIS — I10 Essential (primary) hypertension: Secondary | ICD-10-CM | POA: Diagnosis present

## 2012-03-09 DIAGNOSIS — M5137 Other intervertebral disc degeneration, lumbosacral region: Secondary | ICD-10-CM | POA: Diagnosis not present

## 2012-03-09 DIAGNOSIS — M48061 Spinal stenosis, lumbar region without neurogenic claudication: Secondary | ICD-10-CM | POA: Diagnosis not present

## 2012-03-09 DIAGNOSIS — M51379 Other intervertebral disc degeneration, lumbosacral region without mention of lumbar back pain or lower extremity pain: Secondary | ICD-10-CM | POA: Diagnosis present

## 2012-03-09 DIAGNOSIS — G4730 Sleep apnea, unspecified: Secondary | ICD-10-CM | POA: Diagnosis present

## 2012-03-09 DIAGNOSIS — F3289 Other specified depressive episodes: Secondary | ICD-10-CM | POA: Diagnosis present

## 2012-03-09 DIAGNOSIS — Z8673 Personal history of transient ischemic attack (TIA), and cerebral infarction without residual deficits: Secondary | ICD-10-CM | POA: Diagnosis not present

## 2012-03-09 DIAGNOSIS — E039 Hypothyroidism, unspecified: Secondary | ICD-10-CM | POA: Diagnosis present

## 2012-03-09 DIAGNOSIS — Z981 Arthrodesis status: Secondary | ICD-10-CM | POA: Diagnosis not present

## 2012-03-09 HISTORY — DX: Personal history of other diseases of the musculoskeletal system and connective tissue: Z87.39

## 2012-03-09 HISTORY — PX: ANTERIOR LAT LUMBAR FUSION: SHX1168

## 2012-03-09 HISTORY — DX: Obstructive sleep apnea (adult) (pediatric): G47.33

## 2012-03-09 HISTORY — DX: Low back pain: M54.5

## 2012-03-09 HISTORY — PX: POSTERIOR FUSION LUMBAR SPINE: SUR632

## 2012-03-09 HISTORY — DX: Personal history of other diseases of the respiratory system: Z87.09

## 2012-03-09 HISTORY — DX: Other chronic pain: G89.29

## 2012-03-09 HISTORY — DX: Pneumonia, unspecified organism: J18.9

## 2012-03-09 HISTORY — DX: Low back pain, unspecified: M54.50

## 2012-03-09 HISTORY — DX: Dependence on other enabling machines and devices: Z99.89

## 2012-03-09 SURGERY — ANTERIOR LATERAL LUMBAR FUSION 1 LEVEL
Anesthesia: General | Site: Spine Lumbar | Wound class: Clean

## 2012-03-09 MED ORDER — EPHEDRINE SULFATE 50 MG/ML IJ SOLN
INTRAMUSCULAR | Status: DC | PRN
  Administered 2012-03-09 (×4): 10 mg via INTRAVENOUS
  Administered 2012-03-09 (×2): 5 mg via INTRAVENOUS

## 2012-03-09 MED ORDER — SODIUM CHLORIDE 0.9 % IV SOLN
INTRAVENOUS | Status: AC
  Filled 2012-03-09: qty 500

## 2012-03-09 MED ORDER — HEMOSTATIC AGENTS (NO CHARGE) OPTIME
TOPICAL | Status: DC | PRN
  Administered 2012-03-09: 1 via TOPICAL

## 2012-03-09 MED ORDER — THROMBIN 5000 UNITS EX SOLR
CUTANEOUS | Status: DC | PRN
  Administered 2012-03-09 (×2): 5000 [IU] via TOPICAL

## 2012-03-09 MED ORDER — BACITRACIN 50000 UNITS IM SOLR
INTRAMUSCULAR | Status: AC
  Filled 2012-03-09: qty 1

## 2012-03-09 MED ORDER — PHENOL 1.4 % MT LIQD: 1.0000 | OROMUCOSAL | Status: DC | PRN

## 2012-03-09 MED ORDER — SODIUM CHLORIDE 0.9 % IV SOLN: 250.0000 mL | INTRAVENOUS | Status: DC

## 2012-03-09 MED ORDER — LEVOTHYROXINE SODIUM 150 MCG PO TABS
300.0000 ug | ORAL_TABLET | Freq: Every day | ORAL | Status: DC
  Administered 2012-03-10 – 2012-03-11 (×2): 300 ug via ORAL
  Filled 2012-03-09 (×4): qty 2

## 2012-03-09 MED ORDER — ZOLPIDEM TARTRATE 5 MG PO TABS: 5.0000 mg | ORAL_TABLET | Freq: Every evening | ORAL | Status: DC | PRN

## 2012-03-09 MED ORDER — ACETAMINOPHEN 325 MG PO TABS: 650.0000 mg | ORAL_TABLET | ORAL | Status: DC | PRN

## 2012-03-09 MED ORDER — LIDOCAINE HCL (CARDIAC) 20 MG/ML IV SOLN
INTRAVENOUS | Status: DC | PRN
  Administered 2012-03-09: 100 mg via INTRAVENOUS

## 2012-03-09 MED ORDER — DEXAMETHASONE SODIUM PHOSPHATE 4 MG/ML IJ SOLN
4.0000 mg | Freq: Four times a day (QID) | INTRAMUSCULAR | Status: DC
  Administered 2012-03-09: 4 mg via INTRAVENOUS
  Filled 2012-03-09 (×9): qty 1

## 2012-03-09 MED ORDER — NEOSTIGMINE METHYLSULFATE 1 MG/ML IJ SOLN
INTRAMUSCULAR | Status: DC | PRN
  Administered 2012-03-09: 5 mg via INTRAVENOUS

## 2012-03-09 MED ORDER — SUCCINYLCHOLINE CHLORIDE 20 MG/ML IJ SOLN
INTRAMUSCULAR | Status: DC | PRN
Start: 2012-03-09 — End: 2012-03-09
  Administered 2012-03-09: 200 mg via INTRAVENOUS

## 2012-03-09 MED ORDER — SODIUM CHLORIDE 0.9 % IR SOLN
Status: DC | PRN
  Administered 2012-03-09 (×2)

## 2012-03-09 MED ORDER — HYDROMORPHONE HCL PF 1 MG/ML IJ SOLN
INTRAMUSCULAR | Status: AC
  Administered 2012-03-09: 0.5 mg via INTRAVENOUS
  Filled 2012-03-09: qty 1

## 2012-03-09 MED ORDER — POTASSIUM CHLORIDE IN NACL 20-0.9 MEQ/L-% IV SOLN
INTRAVENOUS | Status: DC
  Administered 2012-03-09: 16:00:00 via INTRAVENOUS
  Filled 2012-03-09 (×6): qty 1000

## 2012-03-09 MED ORDER — CYCLOBENZAPRINE HCL 10 MG PO TABS: 10.0000 mg | ORAL_TABLET | Freq: Three times a day (TID) | ORAL | Status: DC | PRN

## 2012-03-09 MED ORDER — SENNA 8.6 MG PO TABS
1.0000 | ORAL_TABLET | Freq: Two times a day (BID) | ORAL | Status: DC
  Administered 2012-03-10: 8.6 mg via ORAL
  Filled 2012-03-09 (×4): qty 1

## 2012-03-09 MED ORDER — BUPIVACAINE HCL (PF) 0.25 % IJ SOLN
INTRAMUSCULAR | Status: DC | PRN
  Administered 2012-03-09: 4 mL
  Administered 2012-03-09: 3 mL

## 2012-03-09 MED ORDER — LIDOCAINE HCL 4 % MT SOLN
OROMUCOSAL | Status: DC | PRN
  Administered 2012-03-09: 4 mL via TOPICAL

## 2012-03-09 MED ORDER — MENTHOL 3 MG MT LOZG: 1.0000 | LOZENGE | OROMUCOSAL | Status: DC | PRN

## 2012-03-09 MED ORDER — MORPHINE SULFATE 2 MG/ML IJ SOLN
1.0000 mg | INTRAMUSCULAR | Status: DC | PRN
  Administered 2012-03-09: 2 mg via INTRAVENOUS
  Filled 2012-03-09: qty 1

## 2012-03-09 MED ORDER — DEXAMETHASONE SODIUM PHOSPHATE 10 MG/ML IJ SOLN: 10.0000 mg | INTRAMUSCULAR | Status: DC

## 2012-03-09 MED ORDER — DEXAMETHASONE 4 MG PO TABS
4.0000 mg | ORAL_TABLET | Freq: Four times a day (QID) | ORAL | Status: DC
  Administered 2012-03-10 – 2012-03-11 (×7): 4 mg via ORAL
  Filled 2012-03-09 (×11): qty 1

## 2012-03-09 MED ORDER — SODIUM CHLORIDE 0.9 % IJ SOLN
3.0000 mL | Freq: Two times a day (BID) | INTRAMUSCULAR | Status: DC
  Administered 2012-03-10: 3 mL via INTRAVENOUS

## 2012-03-09 MED ORDER — ACETAMINOPHEN 650 MG RE SUPP: 650.0000 mg | RECTAL | Status: DC | PRN

## 2012-03-09 MED ORDER — ONDANSETRON HCL 4 MG/2ML IJ SOLN: 4.0000 mg | INTRAMUSCULAR | Status: DC | PRN

## 2012-03-09 MED ORDER — CEFAZOLIN SODIUM 1-5 GM-% IV SOLN
1.0000 g | Freq: Three times a day (TID) | INTRAVENOUS | Status: AC
  Administered 2012-03-09 – 2012-03-10 (×2): 1 g via INTRAVENOUS
  Filled 2012-03-09 (×2): qty 50

## 2012-03-09 MED ORDER — FUROSEMIDE 20 MG PO TABS
20.0000 mg | ORAL_TABLET | Freq: Two times a day (BID) | ORAL | Status: DC
  Administered 2012-03-09 – 2012-03-11 (×4): 20 mg via ORAL
  Filled 2012-03-09 (×7): qty 1

## 2012-03-09 MED ORDER — GLYCOPYRROLATE 0.2 MG/ML IJ SOLN
INTRAMUSCULAR | Status: DC | PRN
  Administered 2012-03-09: 0.6 mg via INTRAVENOUS
  Administered 2012-03-09: 0.2 mg via INTRAVENOUS

## 2012-03-09 MED ORDER — ONDANSETRON HCL 4 MG/2ML IJ SOLN
INTRAMUSCULAR | Status: DC | PRN
  Administered 2012-03-09: 4 mg via INTRAVENOUS

## 2012-03-09 MED ORDER — PHENYLEPHRINE HCL 10 MG/ML IJ SOLN
INTRAMUSCULAR | Status: DC | PRN
  Administered 2012-03-09: 40 ug via INTRAVENOUS
  Administered 2012-03-09: 80 ug via INTRAVENOUS
  Administered 2012-03-09: 40 ug via INTRAVENOUS
  Administered 2012-03-09: 80 ug via INTRAVENOUS
  Administered 2012-03-09 (×2): 40 ug via INTRAVENOUS
  Administered 2012-03-09 (×3): 80 ug via INTRAVENOUS
  Administered 2012-03-09: 40 ug via INTRAVENOUS
  Administered 2012-03-09: 80 ug via INTRAVENOUS
  Administered 2012-03-09: 40 ug via INTRAVENOUS

## 2012-03-09 MED ORDER — CARVEDILOL 3.125 MG PO TABS
3.1250 mg | ORAL_TABLET | Freq: Two times a day (BID) | ORAL | Status: DC
  Administered 2012-03-10 – 2012-03-11 (×3): 3.125 mg via ORAL
  Filled 2012-03-09 (×6): qty 1

## 2012-03-09 MED ORDER — SODIUM CHLORIDE 0.9 % IJ SOLN: 3.0000 mL | INTRAMUSCULAR | Status: DC | PRN

## 2012-03-09 MED ORDER — ESCITALOPRAM OXALATE 20 MG PO TABS
20.0000 mg | ORAL_TABLET | Freq: Every day | ORAL | Status: DC
  Administered 2012-03-10 – 2012-03-11 (×2): 20 mg via ORAL
  Filled 2012-03-09 (×3): qty 1

## 2012-03-09 MED ORDER — DEXAMETHASONE SODIUM PHOSPHATE 10 MG/ML IJ SOLN
INTRAMUSCULAR | Status: AC
  Administered 2012-03-09: 10 mg via INTRAVENOUS
  Filled 2012-03-09: qty 1

## 2012-03-09 MED ORDER — LISINOPRIL 40 MG PO TABS
40.0000 mg | ORAL_TABLET | Freq: Every day | ORAL | Status: DC
  Administered 2012-03-10 – 2012-03-11 (×2): 40 mg via ORAL
  Filled 2012-03-09 (×3): qty 1

## 2012-03-09 MED ORDER — FENTANYL CITRATE 0.05 MG/ML IJ SOLN
INTRAMUSCULAR | Status: DC | PRN
  Administered 2012-03-09: 50 ug via INTRAVENOUS
  Administered 2012-03-09: 250 ug via INTRAVENOUS

## 2012-03-09 MED ORDER — ACETAMINOPHEN 10 MG/ML IV SOLN
INTRAVENOUS | Status: AC
  Filled 2012-03-09: qty 100

## 2012-03-09 MED ORDER — ALLOPURINOL 100 MG PO TABS
200.0000 mg | ORAL_TABLET | Freq: Every day | ORAL | Status: DC
  Administered 2012-03-10 – 2012-03-11 (×2): 200 mg via ORAL
  Filled 2012-03-09 (×3): qty 2

## 2012-03-09 MED ORDER — ACETAMINOPHEN 10 MG/ML IV SOLN
INTRAVENOUS | Status: DC | PRN
  Administered 2012-03-09: 1000 mg via INTRAVENOUS

## 2012-03-09 MED ORDER — SPIRONOLACTONE 25 MG PO TABS
25.0000 mg | ORAL_TABLET | Freq: Every day | ORAL | Status: DC
Start: 2012-03-10 — End: 2012-03-11
  Administered 2012-03-10 – 2012-03-11 (×2): 25 mg via ORAL
  Filled 2012-03-09 (×3): qty 1

## 2012-03-09 MED ORDER — ONDANSETRON HCL 4 MG/2ML IJ SOLN: 4.0000 mg | Freq: Once | INTRAMUSCULAR | Status: DC | PRN

## 2012-03-09 MED ORDER — 0.9 % SODIUM CHLORIDE (POUR BTL) OPTIME
TOPICAL | Status: DC | PRN
  Administered 2012-03-09 (×2): 1000 mL

## 2012-03-09 MED ORDER — ROCURONIUM BROMIDE 100 MG/10ML IV SOLN
INTRAVENOUS | Status: DC | PRN
  Administered 2012-03-09: 30 mg via INTRAVENOUS

## 2012-03-09 MED ORDER — PROPOFOL 10 MG/ML IV EMUL
INTRAVENOUS | Status: DC | PRN
  Administered 2012-03-09: 330 mg via INTRAVENOUS
  Administered 2012-03-09: 20 mg via INTRAVENOUS

## 2012-03-09 MED ORDER — OXYCODONE-ACETAMINOPHEN 5-325 MG PO TABS: 1.0000 | ORAL_TABLET | ORAL | Status: DC | PRN

## 2012-03-09 MED ORDER — LACTATED RINGERS IV SOLN
INTRAVENOUS | Status: DC | PRN
  Administered 2012-03-09 (×3): via INTRAVENOUS

## 2012-03-09 MED ORDER — ACETAMINOPHEN 10 MG/ML IV SOLN
1000.0000 mg | Freq: Four times a day (QID) | INTRAVENOUS | Status: AC
  Administered 2012-03-09 – 2012-03-10 (×4): 1000 mg via INTRAVENOUS
  Filled 2012-03-09 (×4): qty 100

## 2012-03-09 MED ORDER — HYDROMORPHONE HCL PF 1 MG/ML IJ SOLN
0.2500 mg | INTRAMUSCULAR | Status: DC | PRN
  Administered 2012-03-09 (×3): 0.5 mg via INTRAVENOUS

## 2012-03-09 MED ORDER — MIDAZOLAM HCL 5 MG/5ML IJ SOLN
INTRAMUSCULAR | Status: DC | PRN
  Administered 2012-03-09: 2 mg via INTRAVENOUS

## 2012-03-09 SURGICAL SUPPLY — 79 items
BAG DECANTER FOR FLEXI CONT (MISCELLANEOUS) ×6 IMPLANT
BENZOIN TINCTURE PRP APPL 2/3 (GAUZE/BANDAGES/DRESSINGS) ×3 IMPLANT
BLADE SURG ROTATE 9660 (MISCELLANEOUS) IMPLANT
BONE MATRIX OSTEOCEL PLUS 5CC (Bone Implant) ×3 IMPLANT
CLOTH BEACON ORANGE TIMEOUT ST (SAFETY) ×6 IMPLANT
CONT SPEC 4OZ CLIKSEAL STRL BL (MISCELLANEOUS) ×3 IMPLANT
CORENT WIDE 10X22X55 (Orthopedic Implant) ×3 IMPLANT
COROENT WIDE 10X22X55 (Orthopedic Implant) ×2 IMPLANT
COVER BACK TABLE 24X17X13 BIG (DRAPES) IMPLANT
COVER TABLE BACK 60X90 (DRAPES) ×3 IMPLANT
DERMABOND ADHESIVE PROPEN (GAUZE/BANDAGES/DRESSINGS) ×2
DERMABOND ADVANCED (GAUZE/BANDAGES/DRESSINGS)
DERMABOND ADVANCED .7 DNX12 (GAUZE/BANDAGES/DRESSINGS) IMPLANT
DERMABOND ADVANCED .7 DNX6 (GAUZE/BANDAGES/DRESSINGS) ×4 IMPLANT
DRAPE C-ARM 42X72 X-RAY (DRAPES) ×15 IMPLANT
DRAPE C-ARMOR (DRAPES) ×3 IMPLANT
DRAPE LAPAROTOMY 100X72X124 (DRAPES) ×6 IMPLANT
DRAPE POUCH INSTRU U-SHP 10X18 (DRAPES) ×6 IMPLANT
DRAPE SURG 17X23 STRL (DRAPES) ×18 IMPLANT
DRESSING TELFA 8X3 (GAUZE/BANDAGES/DRESSINGS) ×3 IMPLANT
DRSG OPSITE 4X5.5 SM (GAUZE/BANDAGES/DRESSINGS) ×6 IMPLANT
DURAPREP 26ML APPLICATOR (WOUND CARE) ×6 IMPLANT
ELECT BLADE 4.0 EZ CLEAN MEGAD (MISCELLANEOUS) ×3
ELECT REM PT RETURN 9FT ADLT (ELECTROSURGICAL) ×6
ELECTRODE BLDE 4.0 EZ CLN MEGD (MISCELLANEOUS) ×2 IMPLANT
ELECTRODE REM PT RTRN 9FT ADLT (ELECTROSURGICAL) ×4 IMPLANT
EVACUATOR 1/8 PVC DRAIN (DRAIN) ×3 IMPLANT
GAUZE SPONGE 4X4 16PLY XRAY LF (GAUZE/BANDAGES/DRESSINGS) ×3 IMPLANT
GLOVE BIO SURGEON STRL SZ8 (GLOVE) ×6 IMPLANT
GLOVE BIOGEL PI IND STRL 7.0 (GLOVE) ×6 IMPLANT
GLOVE BIOGEL PI IND STRL 7.5 (GLOVE) ×2 IMPLANT
GLOVE BIOGEL PI IND STRL 8 (GLOVE) ×2 IMPLANT
GLOVE BIOGEL PI INDICATOR 7.0 (GLOVE) ×3
GLOVE BIOGEL PI INDICATOR 7.5 (GLOVE) ×1
GLOVE BIOGEL PI INDICATOR 8 (GLOVE) ×1
GLOVE ECLIPSE 7.5 STRL STRAW (GLOVE) ×3 IMPLANT
GLOVE SS BIOGEL STRL SZ 6.5 (GLOVE) ×6 IMPLANT
GLOVE SS BIOGEL STRL SZ 7 (GLOVE) ×4 IMPLANT
GLOVE SUPERSENSE BIOGEL SZ 6.5 (GLOVE) ×3
GLOVE SUPERSENSE BIOGEL SZ 7 (GLOVE) ×2
GOWN BRE IMP SLV AUR LG STRL (GOWN DISPOSABLE) ×12 IMPLANT
GOWN BRE IMP SLV AUR XL STRL (GOWN DISPOSABLE) ×12 IMPLANT
GOWN STRL REIN 2XL LVL4 (GOWN DISPOSABLE) IMPLANT
GUIDEWIRE NITINOL BEVEL TIP (WIRE) ×6 IMPLANT
HEMOSTAT POWDER KIT SURGIFOAM (HEMOSTASIS) IMPLANT
KIT BASIN OR (CUSTOM PROCEDURE TRAY) ×6 IMPLANT
KIT DILATOR XLIF 5 (KITS) ×2 IMPLANT
KIT MAXCESS (KITS) ×3 IMPLANT
KIT NEEDLE NVM5 EMG ELECT (KITS) ×2 IMPLANT
KIT NEEDLE NVM5 EMG ELECTRODE (KITS) ×1
KIT ROOM TURNOVER OR (KITS) ×3 IMPLANT
KIT XLIF (KITS) ×1
NEEDLE HYPO 22GX1.5 SAFETY (NEEDLE) IMPLANT
NEEDLE HYPO 25X1 1.5 SAFETY (NEEDLE) ×6 IMPLANT
NEEDLE I PASS (NEEDLE) ×3 IMPLANT
NS IRRIG 1000ML POUR BTL (IV SOLUTION) ×6 IMPLANT
PACK LAMINECTOMY NEURO (CUSTOM PROCEDURE TRAY) ×6 IMPLANT
PLATE AFFIX 45MM (Plate) ×3 IMPLANT
PUTTY BONE DBX 5CC MIX (Putty) ×3 IMPLANT
ROD 50MM (Rod) ×3 IMPLANT
SCREW PRECEPT 5.5X50 (Screw) ×3 IMPLANT
SCREW PRECEPT 6.5X50 (Screw) ×3 IMPLANT
SCREW PRECEPT SET (Screw) ×6 IMPLANT
SPONGE GAUZE 4X4 12PLY (GAUZE/BANDAGES/DRESSINGS) IMPLANT
SPONGE LAP 4X18 X RAY DECT (DISPOSABLE) IMPLANT
SPONGE SURGIFOAM ABS GEL SZ50 (HEMOSTASIS) IMPLANT
STAPLER SKIN PROX WIDE 3.9 (STAPLE) ×3 IMPLANT
STRIP CLOSURE SKIN 1/2X4 (GAUZE/BANDAGES/DRESSINGS) ×3 IMPLANT
SUT VIC AB 0 CT1 18XCR BRD8 (SUTURE) ×4 IMPLANT
SUT VIC AB 0 CT1 8-18 (SUTURE) ×2
SUT VIC AB 2-0 CP2 18 (SUTURE) ×6 IMPLANT
SUT VIC AB 3-0 SH 8-18 (SUTURE) ×9 IMPLANT
SWABSTICK BENZOIN STERILE (MISCELLANEOUS) IMPLANT
SYR 20ML ECCENTRIC (SYRINGE) ×6 IMPLANT
TAPE CLOTH 3X10 TAN LF (GAUZE/BANDAGES/DRESSINGS) ×9 IMPLANT
TOWEL OR 17X24 6PK STRL BLUE (TOWEL DISPOSABLE) ×6 IMPLANT
TOWEL OR 17X26 10 PK STRL BLUE (TOWEL DISPOSABLE) ×6 IMPLANT
TRAY FOLEY CATH 14FRSI W/METER (CATHETERS) ×3 IMPLANT
WATER STERILE IRR 1000ML POUR (IV SOLUTION) ×6 IMPLANT

## 2012-03-09 NOTE — Op Note (Signed)
03/09/2012  12:26 PM  PATIENT:  Dennis [name]  66 y.o. male  PRE-OPERATIVE DIAGNOSIS:  1. Degenerative disease with recurrent disc herniation L2-3 status post decompressive laminectomy in the remote past, 2. Lumbar spondylosis with facet arthropathy, annular tear, and synovial cyst L4-5, 3. Back and right leg pain  POST-OPERATIVE DIAGNOSIS:  Same  PROCEDURE:  1. Anterolateral retroperitoneal interbody fusion L2-3 utilizing a 10 lordotic by 22 x 55 mm peek interbody cage packed with morcellized allograft, 2. Posterior nonsegmental fixation with percutaneous pedicle screws L2-3, 3. Decompressive posterior hemilaminectomy, medial facetectomy and foraminotomies L4-5 on the right for decompression of the right L5 nerve root, 4. Posterior interlaminar fusion L4-5 on the left utilizing local autograft and morcellized allograft, 5. Nonsegmental fixation L4-5 utilizing the affix plate  SURGEON:  Marikay Alar, MD  ASSISTANTS: Dr. Newell Coral  ANESTHESIA:   General  EBL: 50 ml  Total I/O In: 2800 [I.V.:2800] Out: 330 [Urine:280; Blood:50]  BLOOD ADMINISTERED:none  DRAINS:  medium Hemovac   SPECIMEN:  No Specimen  INDICATION FOR PROCEDURE: This patient underwent a previous L2-3 decompressed laminectomy by a another surgeon in the remote past. He's had a long history of mechanical back pain since that time. Serial MRI showed progressive spondylosis at L2-3 and L4-5. Recommended an XLIF at L2-3 and a decompressive laminectomy L4-5. Intraoperatively Dr. Newell Coral and I discussed the plan at L4-5. We decided against removal of the synovial cyst and simply doing a right-sided decompression because of the patient's right-sided symptoms. We felt that had some instability at L4-5 and therefore decided to do an onlay fusion as well as interspinous plating. Patient understood the risks, benefits, and alternatives and potential outcomes and wished to proceed.  PROCEDURE DETAILS: The patient was brought to the  operating room and after induction of adequate generalized endotracheal anesthesia, the patient was positioned in the right lateral decubitus position exposing the left side. The patient was positioned in the typical XLIF fashion and taped into position and trajectories were confirmed with AP lateral fluoroscopy. The skin was cleaned and then prepped with DuraPrep and draped in the usual sterile fashion. 5 cc of local anesthesia was injected. A lateral incision was made over the appropriate L2-3 disc space and a incision was made posterior lateral to this. Blunt finger dissection was used to enter the retroperitoneal space. I could palpate the psoas musculature as well as the anterior face of the transverse process, and I could feel the fatty tissues of the retroperitoneum. I swept my finger up to the lateral incision and passed my first dilator down to psoas musculature utilizing my index finger. We then used EMG monitoring to monitor our dilator. We checked our position with fluoroscopy and then placed a K wire into the disc space, and then used sequential dilators until our final retractor was in place. We then positioned it utilizing AP lateral fluoroscopy, and used our ball probe to make sure there were no neural structures within the working channel. I then placed the intradiscal shim, and opened my retractor further. The annulus was then incised and the discectomy was done with pituitaries. The annulus was removed from the endplates with a Cobb and the opposite annulus was opened and released. I then used a series of scrapers and shavers to prepare the endplates, taking care not to violate the endplates. I then placed my 16 mm paddle across the disc space to further open opposite annulus and I checked the trajectory with AP and lateral fluoroscopy. I then  used sequential trials to determine the correct size cage. The 10 lordotic trial fit the best, so a corresponding 10 x 22 x 55 mm peek cage was selected and  packed with morcellized allograft. Using sliders it was then tapped gently into the disc space utilizing AP fluoroscopy until it was appropriately positioned. I then irrigated with saline solution containing bacitracin and dried any bleeding points. I checked my final construct with AP lateral fluoroscopy, removed my retractor, and closed the incisions with layers of 0 Vicryl in the fascia, 2-0 Vicryl in the subcutaneous tissues, and 3-0 Vicryl in the subcuticular tissues. The skin was closed with Dermabond. I then turned my attention to the placement of the percutaneous pedicle screws. AP and lateral fluoroscopy was used for placement. I used AP fluoroscopy to confirm my incision points, made small stab incisions and passed my Jamshidi needle to the lateral edge of the pedicle at both L2 and L3. R. Jamshidi needle was monitored as it was advanced through the pedicle of L2 and L3 to it reached the medial border of the pedicle on the AP image. We then turned lateral fluoroscopy and confirmed that the needle was within the vertebral body and placed our K wires. We were then able to tap over the K wires and then place our pedicle screws over the K wires. A 65 screw was used at L3 and a 55 screws used at L2. We then measured the length of our rod and placed it through the [place] and placed our locking caps and position. Then tightened our locking caps with the antitorque device. The [place] were removed and the final construct was checked with AP and lateral fluoroscopy. The stab wounds were then closed with 0 Vicryl in the fascia, 2-0 Vicryl in the subcutaneous tissues, and 0 Vicryl and the subcuticular tissues. The skin was closed with Dermabond. The drapes were then removed and the patient was turned into the prone position on chest rolls, all pressure points were padded. The lumbar region was then prepped with DuraPrep and draped in usual sterile fashion and 5 cc of local anesthesia was injected. A dorsal midline  incision was made and carried down to the lumbosacral fascia. The fascia was opened and the paraspinous musculature was taken a subperiostel fashion to expose  L4-5. Intraoperative fluoroscopy confirmed my level.  I performed a right hemilaminectomy, medial facetectomy, and foraminotomy to decompress the lateral recess as well as the L5 nerve root. A coronary dilator would pass easily along the nerve root. Inspected the disc space assure no tear in the disc or disc herniation.I was going to do a left-sided decompression also upon further conversation with Dr. Newell Coral decided that the risk of doing a left-sided decompression (CSF leak or a stabilization of the segment ) likely outweigh the potential benefits of removing the synovial cyst. We did not feel that the left-sided synovial cyst was causing right-sided symptoms. We felt it was more important to stabilize this segment because it was felt he had some instability there. Therefore we made the decision to corticate the lamina of L4-L5 on the left and placed a mixture of local autograft and morcellized allograft perform intralaminar fusion. We also  decided to place a interspinous plate to reestablish the posterior tension band. The interspinous ligament was removed, and trials were used to determine the correct size of the interspinous plate. The plate was then squeezed into position at the appropriate level. The posterior elements were decorticated with a high-speed drill and  morcellized allograft was placed in order to perform a posterior fusion. The wound was irrigated, a Hemovac drain was placed, and the wound was then closed in layers of 0 Vicryl in the fascia, 2-0 Vicryl in the subcutaneous tissues, and 3-0 Vicryl in the subcuticular tissues. The skin was closed with benzoin and Steri-Strips. A sterile dressing was applied. The patient was then awakened from general anesthesia and transported to the recovery room in stable condition. At the end of the  procedure all sponge, needle, and instrument counts were correct.   PLAN OF CARE: Admit to inpatient   PATIENT DISPOSITION:  PACU - hemodynamically stable.   Delay start of Pharmacological VTE agent (>24hrs) due to surgical blood loss or risk of bleeding:  yes

## 2012-03-09 NOTE — Anesthesia Postprocedure Evaluation (Signed)
  Anesthesia Post-op Note  Patient: Dennis [name]  Procedure(s) Performed: Procedure(s) (LRB): ANTERIOR LATERAL LUMBAR FUSION 1 LEVEL (Left) LUMBAR PERCUTANEOUS PEDICLE SCREW 1 LEVEL (N/A)  Patient Location: PACU  Anesthesia Type: General  Level of Consciousness: awake, alert  and oriented  Airway and Oxygen Therapy: Patient Spontanous Breathing and Patient connected to nasal cannula oxygen  Post-op Pain: none  Post-op Assessment: Post-op Vital signs reviewed  Post-op Vital Signs: Reviewed  Complications: No apparent anesthesia complications

## 2012-03-09 NOTE — Preoperative (Signed)
Beta Blockers   Reason not to administer Beta Blockers:Not Applicable 
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
## 2012-03-09 NOTE — Anesthesia Preprocedure Evaluation (Addendum)
Anesthesia Evaluation  Patient identified by MRN, date of birth, ID band Patient awake and Patient confused    Reviewed: Allergy & Precautions, H&P , NPO status , Patient's Chart, lab work & pertinent test results, reviewed documented beta blocker date and time   History of Anesthesia Complications (+) PONV  Airway Mallampati: I TM Distance: >3 FB Neck ROM: Full    Dental  (+) Teeth Intact and Dental Advisory Given   Pulmonary sleep apnea and Continuous Positive Airway Pressure Ventilation ,  breath sounds clear to auscultation        Cardiovascular hypertension, Pt. on medications and Pt. on home beta blockers + Past MI (2001) Rhythm:Regular Rate:Normal  Nuclear stress test 3 weeks ago -- clean.   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 CVA, No Residual Symptoms    GI/Hepatic   Endo/Other  Hypothyroidism Morbid obesity  Renal/GU      Musculoskeletal   Abdominal   Peds  Hematology   Anesthesia Other Findings   Reproductive/Obstetrics                          Anesthesia Physical Anesthesia Plan  ASA: III  Anesthesia Plan: General   Post-op Pain Management:    Induction:   Airway Management Planned: Oral ETT  Additional Equipment:   Intra-op Plan:   Post-operative Plan:   Informed Consent: I have reviewed the patients History and Physical, chart, labs and discussed the procedure including the risks, benefits and alternatives for the proposed anesthesia with the patient or authorized representative who has indicated his/her understanding and acceptance.   Dental advisory given  Plan Discussed with: CRNA, Anesthesiologist and Surgeon  Anesthesia Plan Comments:         Anesthesia Quick Evaluation

## 2012-03-09 NOTE — Transfer of Care (Signed)
Immediate Anesthesia Transfer of Care Note  Patient: Dennis [name]  Procedure(s) Performed: Procedure(s) (LRB): ANTERIOR LATERAL LUMBAR FUSION 1 LEVEL (Left) LUMBAR PERCUTANEOUS PEDICLE SCREW 1 LEVEL (N/A)  Patient Location: PACU  Anesthesia Type: General  Level of Consciousness: awake, alert  and patient cooperative  Airway & Oxygen Therapy: Patient Spontanous Breathing and Patient connected to face mask oxygen  Post-op Assessment: Report given to PACU RN and Post -op Vital signs reviewed and stable  Post vital signs: Reviewed  Complications: No apparent anesthesia complications

## 2012-03-09 NOTE — Anesthesia Procedure Notes (Signed)
Procedure Name: Intubation Date/Time: 03/09/2012 7:54 AM Performed by: Lovie Chol Pre-anesthesia Checklist: Patient identified, Emergency Drugs available, Suction available, Patient being monitored and Timeout performed Patient Re-evaluated:Patient Re-evaluated prior to inductionOxygen Delivery Method: Circle system utilized Preoxygenation: Pre-oxygenation with 100% oxygen Intubation Type: IV induction Ventilation: Oral airway inserted - appropriate to patient size and Two handed mask ventilation required Laryngoscope Size: Miller and 3 Grade View: Grade I Tube type: Oral Tube size: 8.0 mm Number of attempts: 1 Airway Equipment and Method: Stylet and LTA kit utilized Placement Confirmation: ETT inserted through vocal cords under direct vision,  positive ETCO2 and breath sounds checked- equal and bilateral Secured at: 22 cm Tube secured with: Tape Dental Injury: Teeth and Oropharynx as per pre-operative assessment

## 2012-03-09 NOTE — OR Nursing (Signed)
Part 1 procedure complete at 1030, part 2 procedure incision at 1051

## 2012-03-10 LAB — GLUCOSE, CAPILLARY: Glucose-Capillary: 144 mg/dL — ABNORMAL HIGH (ref 70–99)

## 2012-03-10 NOTE — Progress Notes (Signed)
Occupational Therapy Treatment Patient Details Name: Dennis [name] MRN: [id number] DOB: [birth year] Today's Date: 03/10/2012 Time: 9562-1308 OT Time Calculation (min): 12 min  OT Assessment / Plan / Recommendation Comments on Treatment Session Nursing stated that pt had questions regarding hygiene after toileting. OT saw pt earlier for eval. Addressed issue with pt using toilet aid. Pt/wife verbbalized understanding. No further OT needs. OT signing off.     Follow Up Recommendations  No OT follow up    Barriers to Discharge       Equipment Recommendations  None recommended by OT    Recommendations for Other Services    Frequency     Plan Discharge plan remains appropriate    Precautions / Restrictions Precautions Precautions: Back Precaution Booklet Issued: Yes (comment) Precaution Comments: pt educated and able to recall 3/3 back precautions Required Braces or Orthoses: Spinal Brace Spinal Brace: Lumbar corset;Applied in sitting position   Pertinent Vitals/Pain none    ADL  ADL Comments: Nsg came to therapist and stated that pt did not know how to complete hygiene after toileting. pt taught how to use toilet aid in order to adhere to back precautions.     OT Diagnosis:    OT Problem List:   OT Treatment Interventions:     OT Goals    Visit Information  Last OT Received On: 03/10/12 Assistance Needed: +1       Prior Functioning  Home Living Lives With: Spouse;Son Available Help at Discharge: Available 24 hours/day Type of Home: House Home Access: Stairs to enter [employer] of Steps: 5 Entrance Stairs-Rails: Can reach both Home Layout: Two level;Able to live on main level with bedroom/bathroom Bathroom Shower/Tub: Walk-in shower;Door [employer]: Standard (3n1 over commode) Bathroom Accessibility: Yes How Accessible: Accessible via walker Home Adaptive Equipment: Reacher;Bedside commode/3-in-1;Walker - rolling;Hand-held shower hose Prior  Function Level of Independence: Independent Able to Take Stairs?: Yes Driving: Yes 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) Communication Communication: No difficulties Dominant Hand: Left    Cognition  Overall Cognitive Status: Appears within functional limits for tasks assessed/performed Arousal/Alertness: Awake/alert Orientation Level: Appears intact for tasks assessed Behavior During Session: [facility] for tasks performed    Mobility  Shoulder Instructions Bed Mobility Bed Mobility: Supine to Sit Supine to Sit: 6: Modified independent (Device/Increase time);HOB flat;With rails Sit to Supine: 5: Supervision Details for Bed Mobility Assistance: VC for proper sequencing to maintain back precautions getting back into becd Transfers Transfers: Sit to Stand;Stand to Sit Sit to Stand: 6: Modified independent (Device/Increase time);With upper extremity assist;From bed Stand to Sit: 6: Modified independent (Device/Increase time);With upper extremity assist;To chair/3-in-1       Exercises      Balance     End of Session OT - End of Session Activity Tolerance: Patient tolerated treatment well Patient left: Other (comment) (in bathroom) Nurse Communication: Other (comment) (need for AE)  GO     Dennis [name],Dennis [name] 03/10/2012, 6:16 PM [facility], OTR/L  [phone] 03/10/2012
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
## 2012-03-10 NOTE — Evaluation (Signed)
Occupational Therapy Evaluation Patient Details Name: Dennis [name] MRN: [id number] DOB: [birth year] Today's Date: 03/10/2012 Time: 1212-1238 OT Time Calculation (min): 26 min  OT Assessment / Plan / Recommendation Clinical Impression  66 yo male s/p XLIF that does not require skilled OT acutely.     OT Assessment  Patient does not need any further OT services    Follow Up Recommendations  No OT follow up    Barriers to Discharge      Equipment Recommendations  None recommended by OT    Recommendations for Other Services    Frequency       Precautions / Restrictions Precautions Precautions: Back Precaution Booklet Issued: Yes (comment) Precaution Comments: pt educated and able to recall 3/3 back precautions Required Braces or Orthoses: Spinal Brace Spinal Brace: Lumbar corset;Applied in sitting position   Pertinent Vitals/Pain none    ADL  Eating/Feeding: Performed;Independent Where Assessed - Eating/Feeding: Chair Grooming: Performed;Wash/dry hands;Wash/dry face;Teeth care;Modified independent Where Assessed - Grooming: Unsupported standing Upper Body Dressing: Performed;Independent Where Assessed - Upper Body Dressing: Unsupported sitting (don brace) Lower Body Dressing:  (uses reacher at baseline ) Toilet Transfer: Performed;Independent Toilet Transfer Method: Sit to Barista: Raised toilet seat with arms (or 3-in-1 over toilet) Toileting - Clothing Manipulation and Hygiene: Performed;Independent Where Assessed - Toileting Clothing Manipulation and Hygiene: Sit to stand from 3-in-1 or toilet Equipment Used: Back brace;Gait belt Transfers/Ambulation Related to ADLs: pt ambulating without RW. Pt pushed it aside once realized no pain present. Oh i dont need that any more ADL Comments: Pt demonstrated bed mobility, toilet transfer, sink level grooming, transfer to chair and does not required skilled Ot . pt is better than baseline PTA and has  previous knowledge from surg. pt recalled all precautions.    OT Diagnosis:    OT Problem List:   OT Treatment Interventions:     OT Goals    Visit Information  Last OT Received On: 03/10/12 Assistance Needed: +1    Subjective Data  Subjective: "OH this is the first time I have gotten up without any pain" Patient Stated Goal: to return to playing golf "thats the only reason I agreed to this "   Prior Functioning  Vision/Perception  Home Living Lives With: Spouse;Son Available Help at Discharge: Available 24 hours/day Type of Home: House Home Access: Stairs to enter [employer] of Steps: 5 Entrance Stairs-Rails: Can reach both Home Layout: Two level;Able to live on main level with bedroom/bathroom Bathroom Shower/Tub: Walk-in shower;Door [employer]: Standard (3n1 over commode) Bathroom Accessibility: Yes How Accessible: Accessible via walker Home Adaptive Equipment: Reacher;Bedside commode/3-in-1;Walker - rolling;Hand-held shower hose Prior Function Level of Independence: Independent Able to Take Stairs?: Yes Driving: Yes 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) Communication Communication: No difficulties Dominant Hand: Left      Cognition  Overall Cognitive Status: Appears within functional limits for tasks assessed/performed Arousal/Alertness: Awake/alert Orientation Level: Appears intact for tasks assessed Behavior During Session: [facility] for tasks performed    Extremity/Trunk Assessment Right Upper Extremity Assessment RUE ROM/Strength/Tone: Within functional levels RUE Sensation: WFL - Light Touch RUE Coordination: WFL - gross/fine motor Left Upper Extremity Assessment LUE ROM/Strength/Tone: Within functional levels LUE Sensation: WFL - Light Touch LUE Coordination: WFL - gross/fine motor Right Lower Extremity Assessment RLE ROM/Strength/Tone: Within functional levels RLE Sensation: WFL - Light Touch Left  Lower Extremity Assessment LLE ROM/Strength/Tone: Within functional levels LLE Sensation: WFL - Light Touch Trunk Assessment Trunk Assessment: Other  exceptions Trunk Exceptions: back surg   Mobility  Shoulder Instructions  Bed Mobility Bed Mobility: Supine to Sit Supine to Sit: 6: Modified independent (Device/Increase time);HOB flat;With rails Sit to Supine: 5: Supervision Details for Bed Mobility Assistance: VC for proper sequencing to maintain back precautions getting back into becd Transfers Transfers: Sit to Stand;Stand to Sit Sit to Stand: 6: Modified independent (Device/Increase time);With upper extremity assist;From bed Stand to Sit: 6: Modified independent (Device/Increase time);With upper extremity assist;To chair/3-in-1       Exercise     Balance     End of Session OT - End of Session Activity Tolerance: Patient tolerated treatment well Patient left: in chair;with call bell/phone within reach Nurse Communication: Mobility status  GO     Harrel Carina [facility] 03/10/2012, 4:58 PM Pager: [phone]

## 2012-03-10 NOTE — Progress Notes (Signed)
Filed Vitals:   03/10/12 0251 03/10/12 0500 03/10/12 0941 03/10/12 1348  BP: 136/81 148/79 143/87 148/93  Pulse: 89 89 86 88  Temp: 97.5 F (36.4 C) 97.8 F (36.6 C) 97.2 F (36.2 C) 98 F (36.7 C)  TempSrc: Oral Oral Oral Oral  Resp: 20 20 20 20   Height:      Weight:      SpO2: 96% 97% 98% 96%    Patient with excellent relief of his right lumbar radicular pain. Remarkably only mild incisional discomfort. Has ambulate in the halls. Good mobility in changing position. Dressing clean and dry. Moderate drainage and to Hemovac drain (over 130 cc over the past 25 hours).  Plan: Encouraged to ambulate several more times today. Will leave drain in for now, hopefully it can be discontinued tomorrow.  Hewitt Shorts, MD 03/10/2012, 3:22 PM

## 2012-03-10 NOTE — Evaluation (Signed)
Physical Therapy Evaluation Patient Details Name: Dennis [name] MRN: [id number] DOB: 08/19/1945 Today's Date: 03/10/2012 Time: 2841-3244 PT Time Calculation (min): 20 min  PT Assessment / Plan / Recommendation Clinical Impression  Pt s/p L2-5 fusion. Pt with good functional mobility, requiring up to supervision level. Pt with no further acute PT needs, all education completed. Will not follow    PT Assessment  Patent does not need any further PT services    Follow Up Recommendations  No PT follow up;Supervision - Intermittent       Equipment Recommendations  None recommended by PT          Precautions / Restrictions Precautions Precautions: Back Precaution Booklet Issued: Yes (comment) Precaution Comments: pt educated and able to recall 3/3 back precautions Required Braces or Orthoses: Spinal Brace Spinal Brace: Lumbar corset;Applied in sitting position   Pertinent Vitals/Pain No c/o pain      Mobility  Bed Mobility Bed Mobility: Sit to Supine Supine to Sit: 6: Modified independent (Device/Increase time);HOB flat;With rails Sit to Supine: 5: Supervision Details for Bed Mobility Assistance: VC for proper sequencing to maintain back precautions getting back into becd Transfers Transfers: Sit to Stand;Stand to Sit Sit to Stand: 6: Modified independent (Device/Increase time);With upper extremity assist;From bed Stand to Sit: 6: Modified independent (Device/Increase time);With upper extremity assist;To chair/3-in-1 Ambulation/Gait Ambulation/Gait Assistance: 6: Modified independent (Device/Increase time) Ambulation Distance (Feet): 600 Feet Assistive device: None Ambulation/Gait Assistance Details: Pt with great mobility, no gait deficts Gait Pattern: Within Functional Limits Gait velocity: normal gait speed Stairs: Yes Stairs Assistance: 5: Supervision Stairs Assistance Details (indicate cue type and reason): VC for proper sequencing and safety on stairs Stair  Management Technique: Two rails;Step to pattern;Forwards Number of Stairs: 3       Visit Information  Last PT Received On: 03/10/12 Assistance Needed: +1    Subjective Data  Patient Stated Goal: to go home safely   Prior Functioning  Home Living Lives With: Spouse;Son Available Help at Discharge: Available 24 hours/day Type of Home: House Home Access: Stairs to enter [employer] of Steps: 5 Entrance Stairs-Rails: Can reach both Home Layout: Two level;Able to live on main level with bedroom/bathroom Bathroom Shower/Tub: Walk-in shower;Door [employer] Toilet: Standard Bathroom Accessibility: Yes How Accessible: Accessible via walker Home Adaptive Equipment: Reacher;Bedside commode/3-in-1;Walker - rolling;Hand-held shower hose Prior Function Level of Independence: Independent Able to Take Stairs?: Yes Driving: Yes Vocation: Full time employment Communication Communication: No difficulties Dominant Hand: Left    Cognition  Overall Cognitive Status: Appears within functional limits for tasks assessed/performed Arousal/Alertness: Awake/alert Orientation Level: Appears intact for tasks assessed Behavior During Session: [facility] for tasks performed    Extremity/Trunk Assessment Right Upper Extremity Assessment RUE ROM/Strength/Tone: Within functional levels RUE Sensation: WFL - Light Touch RUE Coordination: WFL - gross/fine motor Left Upper Extremity Assessment LUE ROM/Strength/Tone: Within functional levels LUE Sensation: WFL - Light Touch LUE Coordination: WFL - gross/fine motor Right Lower Extremity Assessment RLE ROM/Strength/Tone: Within functional levels RLE Sensation: WFL - Light Touch Left Lower Extremity Assessment LLE ROM/Strength/Tone: Within functional levels LLE Sensation: WFL - Light Touch Trunk Assessment Trunk Assessment: Other exceptions Trunk Exceptions: back surg   Balance    End of Session PT - End of Session Equipment Utilized During  Treatment: Back brace Activity Tolerance: Patient tolerated treatment well Patient left: in bed;with call bell/phone within reach;with family/visitor present Nurse Communication: Mobility status    Milana Kidney 03/10/2012, 3:29 PM  03/10/2012 Milana Kidney DPT PAGER:  [phone] OFFICE: [phone]

## 2012-03-11 MED ORDER — OXYCODONE-ACETAMINOPHEN 5-325 MG PO TABS: 1.0000 | ORAL_TABLET | ORAL | Status: AC | PRN

## 2012-03-11 NOTE — Discharge Summary (Signed)
Physician Discharge Summary  Patient ID: Dennis [name] MRN: [id number] DOB/AGE: 66-Jul-1947 66 y.o.  Admit date: 03/09/2012 Discharge date: 03/11/2012  Admission Diagnoses:  Lumbar recurrent disc herniation, lumbar spondylosis, lumbar stenosis, lumbar radiculopathy  Discharge Diagnoses:  Lumbar recurrent disc herniation, lumbar spondylosis, lumbar stenosis, lumbar radiculopathy  Discharged Condition: good  Hospital Course: Patient admitted by Dr. Marikay Alar, who perform surgery at the L2-3 and L4-5 levels. Patient is making good progress following surgery. He is up and living actively. He was seen by PT and OT. He had a lumbar drain that was removed after the drainage diminished substantially. Wounds are healing well. He is up and living actively in the halls. He is comfortable. We are discharging him to home with instructions regarding wound care and activities. He is to return to followup with Dr. Yetta Barre in 2-3 weeks.  Discharge Exam: Blood pressure 179/106, pulse 115, temperature 97.5 F (36.4 C), temperature source Oral, resp. rate 22, height 6\' 7"  (2.007 m), weight 155.312 kg (342 lb 6.4 oz), SpO2 90.00%.  Disposition: Home   Medication List  As of 03/11/2012 10:34 AM   TAKE these medications         allopurinol 100 MG tablet   Commonly known as: ZYLOPRIM   Take 200 mg by mouth daily before breakfast.      atorvastatin 20 MG tablet   Commonly known as: LIPITOR   Take 20 mg by mouth daily after supper.      carvedilol 3.125 MG tablet   Commonly known as: COREG   Take 3.125 mg by mouth 2 (two) times daily with a meal.      escitalopram 20 MG tablet   Commonly known as: LEXAPRO   Take 20 mg by mouth daily after breakfast.      furosemide 20 MG tablet   Commonly known as: LASIX   Take 20 mg by mouth 2 (two) times daily.      levothyroxine 300 MCG tablet   Commonly known as: SYNTHROID, LEVOTHROID   Take 300 mcg by mouth daily with breakfast.      lisinopril 40 MG tablet     Commonly known as: PRINIVIL,ZESTRIL   Take 40 mg by mouth daily with breakfast.      oxyCODONE-acetaminophen 5-325 MG per tablet   Commonly known as: PERCOCET/ROXICET   Take 1-2 tablets by mouth every 4 (four) hours as needed for pain.      oxyCODONE-acetaminophen 5-500 MG per capsule   Commonly known as: TYLOX   Take 2 capsules by mouth every 4 (four) hours as needed.      spironolactone 25 MG tablet   Commonly known as: ALDACTONE   Take 25 mg by mouth daily with breakfast.             Signed: Hewitt Shorts, MD 03/11/2012, 10:34 AM

## 2012-03-13 ENCOUNTER — Encounter (HOSPITAL_COMMUNITY): Payer: Self-pay | Admitting: Neurological Surgery

## 2012-04-10 DIAGNOSIS — L02519 Cutaneous abscess of unspecified hand: Secondary | ICD-10-CM | POA: Diagnosis not present

## 2012-04-24 DIAGNOSIS — M5137 Other intervertebral disc degeneration, lumbosacral region: Secondary | ICD-10-CM | POA: Diagnosis not present

## 2012-04-25 DIAGNOSIS — H6120 Impacted cerumen, unspecified ear: Secondary | ICD-10-CM | POA: Diagnosis not present

## 2012-04-25 DIAGNOSIS — H60339 Swimmer's ear, unspecified ear: Secondary | ICD-10-CM | POA: Diagnosis not present

## 2012-04-25 DIAGNOSIS — I10 Essential (primary) hypertension: Secondary | ICD-10-CM | POA: Diagnosis not present

## 2012-07-10 DIAGNOSIS — M542 Cervicalgia: Secondary | ICD-10-CM | POA: Diagnosis not present

## 2012-07-10 DIAGNOSIS — M62838 Other muscle spasm: Secondary | ICD-10-CM | POA: Diagnosis not present

## 2012-07-11 HISTORY — PX: OTHER SURGICAL HISTORY: SHX169

## 2012-07-13 DIAGNOSIS — M47812 Spondylosis without myelopathy or radiculopathy, cervical region: Secondary | ICD-10-CM | POA: Diagnosis not present

## 2012-07-13 DIAGNOSIS — E079 Disorder of thyroid, unspecified: Secondary | ICD-10-CM | POA: Diagnosis not present

## 2012-07-13 DIAGNOSIS — F329 Major depressive disorder, single episode, unspecified: Secondary | ICD-10-CM | POA: Diagnosis not present

## 2012-07-13 DIAGNOSIS — Z79899 Other long term (current) drug therapy: Secondary | ICD-10-CM | POA: Diagnosis not present

## 2012-07-13 DIAGNOSIS — I10 Essential (primary) hypertension: Secondary | ICD-10-CM | POA: Diagnosis not present

## 2012-07-13 DIAGNOSIS — Z91013 Allergy to seafood: Secondary | ICD-10-CM | POA: Diagnosis not present

## 2012-07-13 DIAGNOSIS — E785 Hyperlipidemia, unspecified: Secondary | ICD-10-CM | POA: Diagnosis not present

## 2012-07-13 DIAGNOSIS — Z888 Allergy status to other drugs, medicaments and biological substances status: Secondary | ICD-10-CM | POA: Diagnosis not present

## 2012-07-13 DIAGNOSIS — M5030 Other cervical disc degeneration, unspecified cervical region: Secondary | ICD-10-CM | POA: Diagnosis not present

## 2012-07-13 DIAGNOSIS — M4802 Spinal stenosis, cervical region: Secondary | ICD-10-CM | POA: Diagnosis not present

## 2012-07-24 ENCOUNTER — Other Ambulatory Visit: Payer: Self-pay | Admitting: Neurological Surgery

## 2012-07-24 DIAGNOSIS — M4802 Spinal stenosis, cervical region: Secondary | ICD-10-CM

## 2012-07-24 DIAGNOSIS — M5137 Other intervertebral disc degeneration, lumbosacral region: Secondary | ICD-10-CM | POA: Diagnosis not present

## 2012-07-24 DIAGNOSIS — M47812 Spondylosis without myelopathy or radiculopathy, cervical region: Secondary | ICD-10-CM | POA: Diagnosis not present

## 2012-07-25 ENCOUNTER — Ambulatory Visit
Admission: RE | Admit: 2012-07-25 | Discharge: 2012-07-25 | Disposition: A | Discharge status: Home / Self Care | Payer: Medicare Other | Source: Ambulatory Visit | Attending: Neurological Surgery | Admitting: Neurological Surgery

## 2012-07-25 DIAGNOSIS — M4802 Spinal stenosis, cervical region: Secondary | ICD-10-CM | POA: Diagnosis not present

## 2012-08-11 ENCOUNTER — Emergency Department: Payer: Self-pay | Admitting: Emergency Medicine

## 2012-08-11 DIAGNOSIS — R4182 Altered mental status, unspecified: Secondary | ICD-10-CM | POA: Diagnosis not present

## 2012-08-11 DIAGNOSIS — M79609 Pain in unspecified limb: Secondary | ICD-10-CM | POA: Diagnosis not present

## 2012-08-11 DIAGNOSIS — Z79899 Other long term (current) drug therapy: Secondary | ICD-10-CM | POA: Diagnosis not present

## 2012-08-11 DIAGNOSIS — Z9889 Other specified postprocedural states: Secondary | ICD-10-CM | POA: Diagnosis not present

## 2012-08-11 DIAGNOSIS — G4733 Obstructive sleep apnea (adult) (pediatric): Secondary | ICD-10-CM | POA: Diagnosis not present

## 2012-08-11 DIAGNOSIS — M7989 Other specified soft tissue disorders: Secondary | ICD-10-CM | POA: Diagnosis not present

## 2012-08-11 DIAGNOSIS — I10 Essential (primary) hypertension: Secondary | ICD-10-CM | POA: Diagnosis not present

## 2012-08-11 DIAGNOSIS — Z8679 Personal history of other diseases of the circulatory system: Secondary | ICD-10-CM | POA: Diagnosis not present

## 2012-08-11 LAB — HEPATIC FUNCTION PANEL A (ARMC)
Albumin: 4.5 g/dL (ref 3.4–5.0)
Alkaline Phosphatase: 91 U/L (ref 50–136)
Bilirubin, Direct: 0.2 mg/dL (ref 0.00–0.20)
Bilirubin,Total: 1.3 mg/dL — ABNORMAL HIGH (ref 0.2–1.0)
SGOT(AST): 20 U/L (ref 15–37)
SGPT (ALT): 25 U/L (ref 12–78)
Total Protein: 8.6 g/dL — ABNORMAL HIGH (ref 6.4–8.2)

## 2012-08-11 LAB — BASIC METABOLIC PANEL
Anion Gap: 7 (ref 7–16)
BUN: 14 mg/dL (ref 7–18)
Calcium, Total: 9.2 mg/dL (ref 8.5–10.1)
Chloride: 102 mmol/L (ref 98–107)
Co2: 30 mmol/L (ref 21–32)
Creatinine: 0.98 mg/dL (ref 0.60–1.30)
EGFR (African American): >60
EGFR (Non-African Amer.): >60
Glucose: 85 mg/dL (ref 65–99)
Osmolality: 277 (ref 275–301)
Potassium: 3.6 mmol/L (ref 3.5–5.1)
Sodium: 139 mmol/L (ref 136–145)

## 2012-08-11 LAB — DIFFERENTIAL
Basophil #: 0 10*3/uL (ref 0.0–0.1)
Basophil %: 0.5 %
Eosinophil #: 0.3 10*3/uL (ref 0.0–0.7)
Eosinophil %: 3.2 %
Lymphocyte #: 1.6 10*3/uL (ref 1.0–3.6)
Lymphocyte %: 19.6 %
Monocyte #: 0.5 x10 3/mm (ref 0.2–1.0)
Monocyte %: 5.7 %
Neutrophil #: 5.9 10*3/uL (ref 1.4–6.5)
Neutrophil %: 71 %

## 2012-08-11 LAB — URINALYSIS, COMPLETE
Bilirubin,UR: NEGATIVE
Blood: NEGATIVE
Glucose,UR: NEGATIVE mg/dL (ref 0–75)
Ketone: NEGATIVE
Nitrite: NEGATIVE
Ph: 6 (ref 4.5–8.0)
Protein: NEGATIVE
RBC,UR: <1 /HPF (ref 0–5)
Specific Gravity: 1.017 (ref 1.003–1.030)
Squamous Epithelial: <1
WBC UR: 6 /HPF (ref 0–5)

## 2012-08-11 LAB — CBC
HCT: 45.4 % (ref 40.0–52.0)
HGB: 15.3 g/dL (ref 13.0–18.0)
MCH: 27.4 pg (ref 26.0–34.0)
MCHC: 33.6 g/dL (ref 32.0–36.0)
MCV: 82 fL (ref 80–100)
Platelet: 214 10*3/uL (ref 150–440)
RBC: 5.57 10*6/uL (ref 4.40–5.90)
RDW: 14.9 % — ABNORMAL HIGH (ref 11.5–14.5)
WBC: 8.1 10*3/uL (ref 3.8–10.6)

## 2012-08-11 LAB — CK TOTAL AND CKMB (NOT AT ARMC)
CK, Total: 56 U/L (ref 35–232)
CK-MB: <0.5 ng/mL — ABNORMAL LOW (ref 0.5–3.6)

## 2012-08-11 LAB — TROPONIN I: Troponin-I: <0.02 ng/mL

## 2012-08-13 DIAGNOSIS — M47812 Spondylosis without myelopathy or radiculopathy, cervical region: Secondary | ICD-10-CM | POA: Diagnosis not present

## 2012-08-14 DIAGNOSIS — M79609 Pain in unspecified limb: Secondary | ICD-10-CM | POA: Diagnosis not present

## 2012-11-12 DIAGNOSIS — M545 Low back pain, unspecified: Secondary | ICD-10-CM | POA: Diagnosis not present

## 2012-11-29 ENCOUNTER — Encounter (HOSPITAL_COMMUNITY): Payer: Self-pay | Admitting: *Deleted

## 2012-11-29 ENCOUNTER — Emergency Department (HOSPITAL_COMMUNITY)
Admission: EM | Admit: 2012-11-29 | Discharge: 2012-11-29 | Disposition: A | Discharge status: Home / Self Care | Payer: Medicare Other | Attending: Emergency Medicine | Admitting: Emergency Medicine

## 2012-11-29 ENCOUNTER — Other Ambulatory Visit: Payer: Self-pay

## 2012-11-29 DIAGNOSIS — I10 Essential (primary) hypertension: Secondary | ICD-10-CM | POA: Insufficient documentation

## 2012-11-29 DIAGNOSIS — M549 Dorsalgia, unspecified: Secondary | ICD-10-CM | POA: Insufficient documentation

## 2012-11-29 DIAGNOSIS — Z8709 Personal history of other diseases of the respiratory system: Secondary | ICD-10-CM | POA: Insufficient documentation

## 2012-11-29 DIAGNOSIS — F3289 Other specified depressive episodes: Secondary | ICD-10-CM | POA: Insufficient documentation

## 2012-11-29 DIAGNOSIS — F489 Nonpsychotic mental disorder, unspecified: Secondary | ICD-10-CM | POA: Diagnosis not present

## 2012-11-29 DIAGNOSIS — M545 Low back pain, unspecified: Secondary | ICD-10-CM | POA: Diagnosis not present

## 2012-11-29 DIAGNOSIS — Z8673 Personal history of transient ischemic attack (TIA), and cerebral infarction without residual deficits: Secondary | ICD-10-CM | POA: Diagnosis not present

## 2012-11-29 DIAGNOSIS — R42 Dizziness and giddiness: Secondary | ICD-10-CM | POA: Insufficient documentation

## 2012-11-29 DIAGNOSIS — E039 Hypothyroidism, unspecified: Secondary | ICD-10-CM | POA: Insufficient documentation

## 2012-11-29 DIAGNOSIS — N390 Urinary tract infection, site not specified: Secondary | ICD-10-CM | POA: Insufficient documentation

## 2012-11-29 DIAGNOSIS — R5383 Other fatigue: Secondary | ICD-10-CM | POA: Diagnosis not present

## 2012-11-29 DIAGNOSIS — G8929 Other chronic pain: Secondary | ICD-10-CM | POA: Insufficient documentation

## 2012-11-29 DIAGNOSIS — M6281 Muscle weakness (generalized): Secondary | ICD-10-CM | POA: Diagnosis not present

## 2012-11-29 DIAGNOSIS — G4733 Obstructive sleep apnea (adult) (pediatric): Secondary | ICD-10-CM | POA: Diagnosis not present

## 2012-11-29 DIAGNOSIS — Z79899 Other long term (current) drug therapy: Secondary | ICD-10-CM | POA: Diagnosis not present

## 2012-11-29 DIAGNOSIS — Z862 Personal history of diseases of the blood and blood-forming organs and certain disorders involving the immune mechanism: Secondary | ICD-10-CM | POA: Insufficient documentation

## 2012-11-29 DIAGNOSIS — R630 Anorexia: Secondary | ICD-10-CM | POA: Insufficient documentation

## 2012-11-29 DIAGNOSIS — Z8639 Personal history of other endocrine, nutritional and metabolic disease: Secondary | ICD-10-CM | POA: Insufficient documentation

## 2012-11-29 DIAGNOSIS — I252 Old myocardial infarction: Secondary | ICD-10-CM | POA: Insufficient documentation

## 2012-11-29 DIAGNOSIS — R509 Fever, unspecified: Secondary | ICD-10-CM | POA: Diagnosis not present

## 2012-11-29 DIAGNOSIS — M129 Arthropathy, unspecified: Secondary | ICD-10-CM | POA: Insufficient documentation

## 2012-11-29 DIAGNOSIS — F329 Major depressive disorder, single episode, unspecified: Secondary | ICD-10-CM | POA: Diagnosis not present

## 2012-11-29 DIAGNOSIS — Z8701 Personal history of pneumonia (recurrent): Secondary | ICD-10-CM | POA: Insufficient documentation

## 2012-11-29 DIAGNOSIS — R5381 Other malaise: Secondary | ICD-10-CM | POA: Diagnosis not present

## 2012-11-29 LAB — URINALYSIS, ROUTINE W REFLEX MICROSCOPIC
Bilirubin Urine: NEGATIVE
Glucose, UA: NEGATIVE mg/dL
Hgb urine dipstick: NEGATIVE
Ketones, ur: NEGATIVE mg/dL
Nitrite: NEGATIVE
Protein, ur: NEGATIVE mg/dL
Specific Gravity, Urine: 1.01 (ref 1.005–1.030)
Urobilinogen, UA: 1 mg/dL (ref 0.0–1.0)
pH: 6 (ref 5.0–8.0)

## 2012-11-29 LAB — URINE MICROSCOPIC-ADD ON

## 2012-11-29 LAB — COMPREHENSIVE METABOLIC PANEL
ALT: 21 U/L (ref 0–53)
AST: 21 U/L (ref 0–37)
Albumin: 3.7 g/dL (ref 3.5–5.2)
Alkaline Phosphatase: 54 U/L (ref 39–117)
BUN: 12 mg/dL (ref 6–23)
CO2: 21 mEq/L (ref 19–32)
Calcium: 9 mg/dL (ref 8.4–10.5)
Chloride: 94 mEq/L — ABNORMAL LOW (ref 96–112)
Creatinine, Ser: 1.1 mg/dL (ref 0.50–1.35)
GFR calc Af Amer: 78 mL/min — ABNORMAL LOW (ref >90)
GFR calc non Af Amer: 68 mL/min — ABNORMAL LOW (ref >90)
Glucose, Bld: 105 mg/dL — ABNORMAL HIGH (ref 70–99)
Potassium: 3.1 mEq/L — ABNORMAL LOW (ref 3.5–5.1)
Sodium: 130 mEq/L — ABNORMAL LOW (ref 135–145)
Total Bilirubin: 2.4 mg/dL — ABNORMAL HIGH (ref 0.3–1.2)
Total Protein: 7.4 g/dL (ref 6.0–8.3)

## 2012-11-29 LAB — GLUCOSE, CAPILLARY: Glucose-Capillary: 108 mg/dL — ABNORMAL HIGH (ref 70–99)

## 2012-11-29 LAB — CBC
HCT: 38.6 % — ABNORMAL LOW (ref 39.0–52.0)
Hemoglobin: 13.8 g/dL (ref 13.0–17.0)
MCH: 28.6 pg (ref 26.0–34.0)
MCHC: 35.8 g/dL (ref 30.0–36.0)
MCV: 79.9 fL (ref 78.0–100.0)
Platelets: 105 10*3/uL — ABNORMAL LOW (ref 150–400)
RBC: 4.83 MIL/uL (ref 4.22–5.81)
RDW: 13.7 % (ref 11.5–15.5)
WBC: 7.6 10*3/uL (ref 4.0–10.5)

## 2012-11-29 LAB — POCT I-STAT TROPONIN I: Troponin i, poc: 0 ng/mL (ref 0.00–0.08)

## 2012-11-29 MED ORDER — CEPHALEXIN 250 MG PO CAPS
500.0000 mg | ORAL_CAPSULE | Freq: Once | ORAL | Status: AC
  Administered 2012-11-29: 500 mg via ORAL
  Filled 2012-11-29: qty 2

## 2012-11-29 MED ORDER — CEPHALEXIN 500 MG PO CAPS: 500.0000 mg | ORAL_CAPSULE | Freq: Four times a day (QID) | ORAL | Status: DC

## 2012-11-29 MED ORDER — CEFTRIAXONE SODIUM 1 G IJ SOLR: 1.0000 g | Freq: Once | INTRAMUSCULAR | Status: DC

## 2012-11-29 MED ORDER — POTASSIUM CHLORIDE CRYS ER 20 MEQ PO TBCR
40.0000 meq | EXTENDED_RELEASE_TABLET | Freq: Once | ORAL | Status: AC
  Administered 2012-11-29: 40 meq via ORAL
  Filled 2012-11-29: qty 2
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
## 2012-11-29 NOTE — ED Notes (Signed)
Pt reports multiple complaints.  States that he hasn't eaten since Monday, states chills, foul odor of urine, change in color of urine, frequency.  Reports lethargy, dizziness, pain in lower back.

## 2012-11-29 NOTE — ED Notes (Signed)
Ordered lunch tray([employer])

## 2012-11-29 NOTE — ED Notes (Signed)
Pt up to the bathroom, cup given for UA sample

## 2012-11-29 NOTE — ED Notes (Signed)
Pt dc'd w/all belongings, alert and ambulatory upon dc, 1 new rx prescribed, pt verbalizes understanding of dc instructions

## 2012-12-01 LAB — URINE CULTURE
Colony Count: NO GROWTH
Culture: NO GROWTH

## 2012-12-01 NOTE — ED Provider Notes (Signed)
I saw and evaluated the patient, reviewed the resident's note and I agree with the findings and plan.   Jacarri Gesner, MD 12/01/12 0702 

## 2012-12-06 DIAGNOSIS — H6120 Impacted cerumen, unspecified ear: Secondary | ICD-10-CM | POA: Diagnosis not present

## 2012-12-06 DIAGNOSIS — G4733 Obstructive sleep apnea (adult) (pediatric): Secondary | ICD-10-CM | POA: Diagnosis not present

## 2012-12-06 DIAGNOSIS — H9319 Tinnitus, unspecified ear: Secondary | ICD-10-CM | POA: Diagnosis not present

## 2012-12-06 DIAGNOSIS — H93299 Other abnormal auditory perceptions, unspecified ear: Secondary | ICD-10-CM | POA: Diagnosis not present

## 2012-12-18 DIAGNOSIS — J189 Pneumonia, unspecified organism: Secondary | ICD-10-CM | POA: Diagnosis not present

## 2012-12-19 ENCOUNTER — Encounter (HOSPITAL_COMMUNITY): Payer: Self-pay | Admitting: *Deleted

## 2012-12-19 ENCOUNTER — Emergency Department (HOSPITAL_COMMUNITY): Payer: Medicare Other

## 2012-12-19 ENCOUNTER — Emergency Department (HOSPITAL_COMMUNITY)
Admission: EM | Admit: 2012-12-19 | Discharge: 2012-12-19 | Disposition: A | Discharge status: Home / Self Care | Payer: Medicare Other | Attending: Emergency Medicine | Admitting: Emergency Medicine

## 2012-12-19 DIAGNOSIS — Z8739 Personal history of other diseases of the musculoskeletal system and connective tissue: Secondary | ICD-10-CM | POA: Diagnosis not present

## 2012-12-19 DIAGNOSIS — I252 Old myocardial infarction: Secondary | ICD-10-CM | POA: Insufficient documentation

## 2012-12-19 DIAGNOSIS — I10 Essential (primary) hypertension: Secondary | ICD-10-CM | POA: Diagnosis not present

## 2012-12-19 DIAGNOSIS — Z87828 Personal history of other (healed) physical injury and trauma: Secondary | ICD-10-CM | POA: Diagnosis not present

## 2012-12-19 DIAGNOSIS — J209 Acute bronchitis, unspecified: Secondary | ICD-10-CM | POA: Insufficient documentation

## 2012-12-19 DIAGNOSIS — Z8673 Personal history of transient ischemic attack (TIA), and cerebral infarction without residual deficits: Secondary | ICD-10-CM | POA: Diagnosis not present

## 2012-12-19 DIAGNOSIS — F489 Nonpsychotic mental disorder, unspecified: Secondary | ICD-10-CM | POA: Diagnosis not present

## 2012-12-19 DIAGNOSIS — N390 Urinary tract infection, site not specified: Secondary | ICD-10-CM | POA: Insufficient documentation

## 2012-12-19 DIAGNOSIS — G8929 Other chronic pain: Secondary | ICD-10-CM | POA: Insufficient documentation

## 2012-12-19 DIAGNOSIS — Z8709 Personal history of other diseases of the respiratory system: Secondary | ICD-10-CM | POA: Insufficient documentation

## 2012-12-19 DIAGNOSIS — E039 Hypothyroidism, unspecified: Secondary | ICD-10-CM | POA: Diagnosis not present

## 2012-12-19 DIAGNOSIS — F329 Major depressive disorder, single episode, unspecified: Secondary | ICD-10-CM | POA: Diagnosis not present

## 2012-12-19 DIAGNOSIS — Z8639 Personal history of other endocrine, nutritional and metabolic disease: Secondary | ICD-10-CM | POA: Insufficient documentation

## 2012-12-19 DIAGNOSIS — J40 Bronchitis, not specified as acute or chronic: Secondary | ICD-10-CM

## 2012-12-19 DIAGNOSIS — Z862 Personal history of diseases of the blood and blood-forming organs and certain disorders involving the immune mechanism: Secondary | ICD-10-CM | POA: Insufficient documentation

## 2012-12-19 DIAGNOSIS — R0602 Shortness of breath: Secondary | ICD-10-CM | POA: Insufficient documentation

## 2012-12-19 DIAGNOSIS — R079 Chest pain, unspecified: Secondary | ICD-10-CM | POA: Diagnosis not present

## 2012-12-19 DIAGNOSIS — Z8669 Personal history of other diseases of the nervous system and sense organs: Secondary | ICD-10-CM | POA: Insufficient documentation

## 2012-12-19 DIAGNOSIS — Z79899 Other long term (current) drug therapy: Secondary | ICD-10-CM | POA: Insufficient documentation

## 2012-12-19 DIAGNOSIS — F3289 Other specified depressive episodes: Secondary | ICD-10-CM | POA: Insufficient documentation

## 2012-12-19 LAB — CBC
HCT: 40.8 % (ref 39.0–52.0)
Hemoglobin: 14.2 g/dL (ref 13.0–17.0)
MCH: 28.2 pg (ref 26.0–34.0)
MCHC: 34.8 g/dL (ref 30.0–36.0)
MCV: 81.1 fL (ref 78.0–100.0)
Platelets: 127 10*3/uL — ABNORMAL LOW (ref 150–400)
RBC: 5.03 MIL/uL (ref 4.22–5.81)
RDW: 14 % (ref 11.5–15.5)
WBC: 5.4 10*3/uL (ref 4.0–10.5)

## 2012-12-19 LAB — BASIC METABOLIC PANEL
BUN: 13 mg/dL (ref 6–23)
CO2: 29 mEq/L (ref 19–32)
Calcium: 9.2 mg/dL (ref 8.4–10.5)
Chloride: 95 mEq/L — ABNORMAL LOW (ref 96–112)
Creatinine, Ser: 1.15 mg/dL (ref 0.50–1.35)
GFR calc Af Amer: 74 mL/min — ABNORMAL LOW (ref >90)
GFR calc non Af Amer: 64 mL/min — ABNORMAL LOW (ref >90)
Glucose, Bld: 103 mg/dL — ABNORMAL HIGH (ref 70–99)
Potassium: 3.6 mEq/L (ref 3.5–5.1)
Sodium: 134 mEq/L — ABNORMAL LOW (ref 135–145)

## 2012-12-19 LAB — URINALYSIS, ROUTINE W REFLEX MICROSCOPIC
Glucose, UA: NEGATIVE mg/dL
Hgb urine dipstick: NEGATIVE
Ketones, ur: 15 mg/dL — AB
Nitrite: NEGATIVE
Protein, ur: NEGATIVE mg/dL
Specific Gravity, Urine: 1.021 (ref 1.005–1.030)
Urobilinogen, UA: 1 mg/dL (ref 0.0–1.0)
pH: 5.5 (ref 5.0–8.0)

## 2012-12-19 LAB — POCT I-STAT TROPONIN I: Troponin i, poc: 0.01 ng/mL (ref 0.00–0.08)

## 2012-12-19 LAB — URINE MICROSCOPIC-ADD ON

## 2012-12-19 LAB — PRO B NATRIURETIC PEPTIDE: Pro B Natriuretic peptide (BNP): 84.5 pg/mL (ref 0–125)

## 2012-12-19 MED ORDER — CEPHALEXIN 500 MG PO CAPS: 500.0000 mg | ORAL_CAPSULE | Freq: Four times a day (QID) | ORAL | Status: DC

## 2012-12-19 MED ORDER — BENZONATATE 100 MG PO CAPS: 100.0000 mg | ORAL_CAPSULE | Freq: Three times a day (TID) | ORAL | Status: DC

## 2012-12-19 MED ORDER — ALBUTEROL (5 MG/ML) CONTINUOUS INHALATION SOLN
10.0000 mg/h | INHALATION_SOLUTION | RESPIRATORY_TRACT | Status: AC
  Administered 2012-12-19: 10 mg/h via RESPIRATORY_TRACT
  Filled 2012-12-19: qty 20

## 2012-12-19 MED ORDER — ALBUTEROL SULFATE HFA 108 (90 BASE) MCG/ACT IN AERS: 2.0000 | INHALATION_SPRAY | RESPIRATORY_TRACT | Status: DC | PRN

## 2012-12-19 MED ORDER — SODIUM CHLORIDE 0.9 % IV BOLUS (SEPSIS)
1000.0000 mL | Freq: Once | INTRAVENOUS | Status: AC
  Administered 2012-12-19: 1000 mL via INTRAVENOUS

## 2012-12-19 MED ORDER — IPRATROPIUM BROMIDE 0.02 % IN SOLN
0.5000 mg | Freq: Once | RESPIRATORY_TRACT | Status: AC
  Administered 2012-12-19: 0.5 mg via RESPIRATORY_TRACT
  Filled 2012-12-19: qty 2.5

## 2012-12-19 NOTE — ED Notes (Signed)
Pt was seen at urgent care yesterday and diagnosed with PNA and given a shot.  Pt states that when he lays back his lungs fill up with fluid, pt does have swelling in his legs.  Pt feels like he is not getting enough oxygenation.  Pt has history of MI, stroke, and takes fluid pills
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
## 2012-12-19 NOTE — ED Notes (Signed)
Family at bedside. 

## 2012-12-20 LAB — URINE CULTURE
Colony Count: NO GROWTH
Culture: NO GROWTH

## 2012-12-24 DIAGNOSIS — I10 Essential (primary) hypertension: Secondary | ICD-10-CM | POA: Diagnosis not present

## 2012-12-24 DIAGNOSIS — R609 Edema, unspecified: Secondary | ICD-10-CM | POA: Diagnosis not present

## 2012-12-24 DIAGNOSIS — J189 Pneumonia, unspecified organism: Secondary | ICD-10-CM | POA: Diagnosis not present

## 2012-12-24 DIAGNOSIS — N390 Urinary tract infection, site not specified: Secondary | ICD-10-CM | POA: Diagnosis not present

## 2012-12-24 DIAGNOSIS — R5381 Other malaise: Secondary | ICD-10-CM | POA: Diagnosis not present

## 2012-12-24 DIAGNOSIS — E782 Mixed hyperlipidemia: Secondary | ICD-10-CM | POA: Diagnosis not present

## 2012-12-24 DIAGNOSIS — R0602 Shortness of breath: Secondary | ICD-10-CM | POA: Diagnosis not present

## 2012-12-24 DIAGNOSIS — E039 Hypothyroidism, unspecified: Secondary | ICD-10-CM | POA: Diagnosis not present

## 2012-12-25 ENCOUNTER — Ambulatory Visit: Payer: Self-pay | Admitting: Internal Medicine

## 2012-12-25 DIAGNOSIS — R609 Edema, unspecified: Secondary | ICD-10-CM | POA: Diagnosis not present

## 2012-12-25 DIAGNOSIS — Z125 Encounter for screening for malignant neoplasm of prostate: Secondary | ICD-10-CM | POA: Diagnosis not present

## 2012-12-25 DIAGNOSIS — R197 Diarrhea, unspecified: Secondary | ICD-10-CM | POA: Diagnosis not present

## 2012-12-25 DIAGNOSIS — R918 Other nonspecific abnormal finding of lung field: Secondary | ICD-10-CM | POA: Diagnosis not present

## 2012-12-25 DIAGNOSIS — R0602 Shortness of breath: Secondary | ICD-10-CM | POA: Diagnosis not present

## 2012-12-25 DIAGNOSIS — R5381 Other malaise: Secondary | ICD-10-CM | POA: Diagnosis not present

## 2012-12-25 DIAGNOSIS — M109 Gout, unspecified: Secondary | ICD-10-CM | POA: Diagnosis not present

## 2012-12-25 DIAGNOSIS — E782 Mixed hyperlipidemia: Secondary | ICD-10-CM | POA: Diagnosis not present

## 2012-12-27 DIAGNOSIS — R109 Unspecified abdominal pain: Secondary | ICD-10-CM | POA: Diagnosis not present

## 2012-12-28 DIAGNOSIS — E039 Hypothyroidism, unspecified: Secondary | ICD-10-CM | POA: Diagnosis not present

## 2012-12-28 DIAGNOSIS — Z9119 Patient's noncompliance with other medical treatment and regimen: Secondary | ICD-10-CM | POA: Diagnosis not present

## 2012-12-28 DIAGNOSIS — G4710 Hypersomnia, unspecified: Secondary | ICD-10-CM | POA: Diagnosis not present

## 2012-12-28 DIAGNOSIS — G4730 Sleep apnea, unspecified: Secondary | ICD-10-CM | POA: Diagnosis not present

## 2012-12-28 DIAGNOSIS — R609 Edema, unspecified: Secondary | ICD-10-CM | POA: Diagnosis not present

## 2012-12-28 DIAGNOSIS — M109 Gout, unspecified: Secondary | ICD-10-CM | POA: Diagnosis not present

## 2012-12-28 DIAGNOSIS — I517 Cardiomegaly: Secondary | ICD-10-CM | POA: Diagnosis not present

## 2012-12-28 DIAGNOSIS — I10 Essential (primary) hypertension: Secondary | ICD-10-CM | POA: Diagnosis not present

## 2012-12-28 DIAGNOSIS — R109 Unspecified abdominal pain: Secondary | ICD-10-CM | POA: Diagnosis not present

## 2013-01-01 DIAGNOSIS — M109 Gout, unspecified: Secondary | ICD-10-CM | POA: Diagnosis not present

## 2013-01-01 DIAGNOSIS — E782 Mixed hyperlipidemia: Secondary | ICD-10-CM | POA: Diagnosis not present

## 2013-01-01 DIAGNOSIS — G4710 Hypersomnia, unspecified: Secondary | ICD-10-CM | POA: Diagnosis not present

## 2013-01-01 DIAGNOSIS — I10 Essential (primary) hypertension: Secondary | ICD-10-CM | POA: Diagnosis not present

## 2013-01-01 DIAGNOSIS — G4730 Sleep apnea, unspecified: Secondary | ICD-10-CM | POA: Diagnosis not present

## 2013-01-01 DIAGNOSIS — E039 Hypothyroidism, unspecified: Secondary | ICD-10-CM | POA: Diagnosis not present

## 2013-01-01 DIAGNOSIS — F329 Major depressive disorder, single episode, unspecified: Secondary | ICD-10-CM | POA: Diagnosis not present

## 2013-01-02 DIAGNOSIS — G4710 Hypersomnia, unspecified: Secondary | ICD-10-CM | POA: Diagnosis not present

## 2013-01-02 DIAGNOSIS — G4730 Sleep apnea, unspecified: Secondary | ICD-10-CM | POA: Diagnosis not present

## 2013-01-02 DIAGNOSIS — G4720 Circadian rhythm sleep disorder, unspecified type: Secondary | ICD-10-CM | POA: Diagnosis not present

## 2013-01-08 DIAGNOSIS — G4720 Circadian rhythm sleep disorder, unspecified type: Secondary | ICD-10-CM | POA: Diagnosis not present

## 2013-01-08 DIAGNOSIS — G4710 Hypersomnia, unspecified: Secondary | ICD-10-CM | POA: Diagnosis not present

## 2013-01-08 DIAGNOSIS — I10 Essential (primary) hypertension: Secondary | ICD-10-CM | POA: Diagnosis not present

## 2013-01-08 DIAGNOSIS — G4730 Sleep apnea, unspecified: Secondary | ICD-10-CM | POA: Diagnosis not present

## 2013-01-08 DIAGNOSIS — R5381 Other malaise: Secondary | ICD-10-CM | POA: Diagnosis not present

## 2013-01-08 DIAGNOSIS — Z006 Encounter for examination for normal comparison and control in clinical research program: Secondary | ICD-10-CM | POA: Diagnosis not present

## 2013-01-08 DIAGNOSIS — E782 Mixed hyperlipidemia: Secondary | ICD-10-CM | POA: Diagnosis not present

## 2013-01-08 DIAGNOSIS — R5383 Other fatigue: Secondary | ICD-10-CM | POA: Diagnosis not present

## 2013-01-08 DIAGNOSIS — G4733 Obstructive sleep apnea (adult) (pediatric): Secondary | ICD-10-CM | POA: Diagnosis not present

## 2013-01-09 DIAGNOSIS — L739 Follicular disorder, unspecified: Secondary | ICD-10-CM | POA: Diagnosis not present

## 2013-01-09 DIAGNOSIS — L821 Other seborrheic keratosis: Secondary | ICD-10-CM | POA: Diagnosis not present

## 2013-01-09 DIAGNOSIS — D2370 Other benign neoplasm of skin of unspecified lower limb, including hip: Secondary | ICD-10-CM | POA: Diagnosis not present

## 2013-01-09 DIAGNOSIS — L723 Sebaceous cyst: Secondary | ICD-10-CM | POA: Diagnosis not present

## 2013-01-09 DIAGNOSIS — L538 Other specified erythematous conditions: Secondary | ICD-10-CM | POA: Diagnosis not present

## 2013-01-23 DIAGNOSIS — N3942 Incontinence without sensory awareness: Secondary | ICD-10-CM | POA: Diagnosis not present

## 2013-01-23 DIAGNOSIS — N3000 Acute cystitis without hematuria: Secondary | ICD-10-CM | POA: Diagnosis not present

## 2013-01-23 DIAGNOSIS — N3946 Mixed incontinence: Secondary | ICD-10-CM | POA: Diagnosis not present

## 2013-01-23 DIAGNOSIS — R339 Retention of urine, unspecified: Secondary | ICD-10-CM | POA: Diagnosis not present

## 2013-01-23 DIAGNOSIS — N320 Bladder-neck obstruction: Secondary | ICD-10-CM | POA: Diagnosis not present

## 2013-01-23 DIAGNOSIS — R82998 Other abnormal findings in urine: Secondary | ICD-10-CM | POA: Diagnosis not present

## 2013-01-28 DIAGNOSIS — N200 Calculus of kidney: Secondary | ICD-10-CM | POA: Diagnosis not present

## 2013-01-28 DIAGNOSIS — N3000 Acute cystitis without hematuria: Secondary | ICD-10-CM | POA: Diagnosis not present

## 2013-01-30 DIAGNOSIS — G4710 Hypersomnia, unspecified: Secondary | ICD-10-CM | POA: Diagnosis not present

## 2013-01-30 DIAGNOSIS — G4730 Sleep apnea, unspecified: Secondary | ICD-10-CM | POA: Diagnosis not present

## 2013-02-06 DIAGNOSIS — N390 Urinary tract infection, site not specified: Secondary | ICD-10-CM | POA: Diagnosis not present

## 2013-02-06 DIAGNOSIS — N3946 Mixed incontinence: Secondary | ICD-10-CM | POA: Diagnosis not present

## 2013-02-06 DIAGNOSIS — N3000 Acute cystitis without hematuria: Secondary | ICD-10-CM | POA: Diagnosis not present

## 2013-02-07 DIAGNOSIS — R110 Nausea: Secondary | ICD-10-CM | POA: Diagnosis not present

## 2013-02-07 DIAGNOSIS — R82998 Other abnormal findings in urine: Secondary | ICD-10-CM | POA: Diagnosis not present

## 2013-02-14 DIAGNOSIS — E782 Mixed hyperlipidemia: Secondary | ICD-10-CM | POA: Diagnosis not present

## 2013-02-14 DIAGNOSIS — E039 Hypothyroidism, unspecified: Secondary | ICD-10-CM | POA: Diagnosis not present

## 2013-02-14 DIAGNOSIS — G4733 Obstructive sleep apnea (adult) (pediatric): Secondary | ICD-10-CM | POA: Diagnosis not present

## 2013-02-14 DIAGNOSIS — I10 Essential (primary) hypertension: Secondary | ICD-10-CM | POA: Diagnosis not present

## 2013-02-14 DIAGNOSIS — R945 Abnormal results of liver function studies: Secondary | ICD-10-CM | POA: Diagnosis not present

## 2013-02-14 DIAGNOSIS — R609 Edema, unspecified: Secondary | ICD-10-CM | POA: Diagnosis not present

## 2013-02-14 DIAGNOSIS — N39490 Overflow incontinence: Secondary | ICD-10-CM | POA: Diagnosis not present

## 2013-02-14 DIAGNOSIS — M109 Gout, unspecified: Secondary | ICD-10-CM | POA: Diagnosis not present

## 2013-02-19 DIAGNOSIS — N3000 Acute cystitis without hematuria: Secondary | ICD-10-CM | POA: Diagnosis not present

## 2013-02-19 DIAGNOSIS — N320 Bladder-neck obstruction: Secondary | ICD-10-CM | POA: Diagnosis not present

## 2013-02-19 DIAGNOSIS — N3942 Incontinence without sensory awareness: Secondary | ICD-10-CM | POA: Diagnosis not present

## 2013-02-19 DIAGNOSIS — N35919 Unspecified urethral stricture, male, unspecified site: Secondary | ICD-10-CM | POA: Diagnosis not present

## 2013-03-06 DIAGNOSIS — R339 Retention of urine, unspecified: Secondary | ICD-10-CM | POA: Diagnosis not present

## 2013-03-06 DIAGNOSIS — N3946 Mixed incontinence: Secondary | ICD-10-CM | POA: Diagnosis not present

## 2013-03-11 DIAGNOSIS — J019 Acute sinusitis, unspecified: Secondary | ICD-10-CM | POA: Diagnosis not present

## 2013-03-18 DIAGNOSIS — R339 Retention of urine, unspecified: Secondary | ICD-10-CM | POA: Diagnosis not present

## 2013-03-18 DIAGNOSIS — N3942 Incontinence without sensory awareness: Secondary | ICD-10-CM | POA: Diagnosis not present

## 2013-03-19 DIAGNOSIS — D1739 Benign lipomatous neoplasm of skin and subcutaneous tissue of other sites: Secondary | ICD-10-CM | POA: Diagnosis not present

## 2013-03-19 DIAGNOSIS — D1801 Hemangioma of skin and subcutaneous tissue: Secondary | ICD-10-CM | POA: Diagnosis not present

## 2013-03-19 DIAGNOSIS — L821 Other seborrheic keratosis: Secondary | ICD-10-CM | POA: Diagnosis not present

## 2013-03-26 DIAGNOSIS — R339 Retention of urine, unspecified: Secondary | ICD-10-CM | POA: Diagnosis not present

## 2013-03-26 DIAGNOSIS — N320 Bladder-neck obstruction: Secondary | ICD-10-CM | POA: Diagnosis not present

## 2013-03-26 DIAGNOSIS — N3000 Acute cystitis without hematuria: Secondary | ICD-10-CM | POA: Diagnosis not present

## 2013-03-26 DIAGNOSIS — N3942 Incontinence without sensory awareness: Secondary | ICD-10-CM | POA: Diagnosis not present

## 2013-03-26 DIAGNOSIS — N35919 Unspecified urethral stricture, male, unspecified site: Secondary | ICD-10-CM | POA: Diagnosis not present

## 2013-04-29 DIAGNOSIS — N35919 Unspecified urethral stricture, male, unspecified site: Secondary | ICD-10-CM | POA: Diagnosis not present

## 2013-04-29 DIAGNOSIS — N320 Bladder-neck obstruction: Secondary | ICD-10-CM | POA: Diagnosis not present

## 2013-04-30 DIAGNOSIS — M549 Dorsalgia, unspecified: Secondary | ICD-10-CM | POA: Diagnosis not present

## 2013-04-30 DIAGNOSIS — I10 Essential (primary) hypertension: Secondary | ICD-10-CM | POA: Diagnosis not present

## 2013-04-30 DIAGNOSIS — I2510 Atherosclerotic heart disease of native coronary artery without angina pectoris: Secondary | ICD-10-CM | POA: Diagnosis not present

## 2013-04-30 DIAGNOSIS — N400 Enlarged prostate without lower urinary tract symptoms: Secondary | ICD-10-CM | POA: Diagnosis not present

## 2013-05-06 DIAGNOSIS — Z23 Encounter for immunization: Secondary | ICD-10-CM | POA: Diagnosis not present

## 2013-05-08 DIAGNOSIS — G4710 Hypersomnia, unspecified: Secondary | ICD-10-CM | POA: Diagnosis not present

## 2013-05-10 DIAGNOSIS — R5381 Other malaise: Secondary | ICD-10-CM | POA: Diagnosis not present

## 2013-05-10 DIAGNOSIS — E669 Obesity, unspecified: Secondary | ICD-10-CM | POA: Diagnosis not present

## 2013-05-10 DIAGNOSIS — Z79899 Other long term (current) drug therapy: Secondary | ICD-10-CM | POA: Diagnosis not present

## 2013-05-10 DIAGNOSIS — Z1331 Encounter for screening for depression: Secondary | ICD-10-CM | POA: Diagnosis not present

## 2013-05-10 DIAGNOSIS — I10 Essential (primary) hypertension: Secondary | ICD-10-CM | POA: Diagnosis not present

## 2013-05-10 DIAGNOSIS — M545 Low back pain, unspecified: Secondary | ICD-10-CM | POA: Diagnosis not present

## 2013-05-10 DIAGNOSIS — M76899 Other specified enthesopathies of unspecified lower limb, excluding foot: Secondary | ICD-10-CM | POA: Diagnosis not present

## 2013-05-10 DIAGNOSIS — M25559 Pain in unspecified hip: Secondary | ICD-10-CM | POA: Diagnosis not present

## 2013-05-10 DIAGNOSIS — N400 Enlarged prostate without lower urinary tract symptoms: Secondary | ICD-10-CM | POA: Diagnosis not present

## 2013-05-10 DIAGNOSIS — E7800 Pure hypercholesterolemia, unspecified: Secondary | ICD-10-CM | POA: Diagnosis not present

## 2013-05-10 DIAGNOSIS — I2510 Atherosclerotic heart disease of native coronary artery without angina pectoris: Secondary | ICD-10-CM | POA: Diagnosis not present

## 2013-05-14 DIAGNOSIS — G4733 Obstructive sleep apnea (adult) (pediatric): Secondary | ICD-10-CM | POA: Diagnosis not present

## 2013-05-14 DIAGNOSIS — I2510 Atherosclerotic heart disease of native coronary artery without angina pectoris: Secondary | ICD-10-CM | POA: Diagnosis not present

## 2013-05-14 DIAGNOSIS — Z01810 Encounter for preprocedural cardiovascular examination: Secondary | ICD-10-CM | POA: Diagnosis not present

## 2013-05-14 DIAGNOSIS — N320 Bladder-neck obstruction: Secondary | ICD-10-CM | POA: Diagnosis not present

## 2013-05-14 DIAGNOSIS — I10 Essential (primary) hypertension: Secondary | ICD-10-CM | POA: Diagnosis not present

## 2013-05-15 ENCOUNTER — Other Ambulatory Visit: Payer: Self-pay | Admitting: Urology

## 2013-05-22 ENCOUNTER — Encounter (HOSPITAL_COMMUNITY): Payer: Self-pay

## 2013-05-22 ENCOUNTER — Other Ambulatory Visit (HOSPITAL_COMMUNITY): Payer: Self-pay | Admitting: Urology

## 2013-05-22 ENCOUNTER — Encounter (HOSPITAL_COMMUNITY): Payer: Self-pay | Admitting: Pharmacy Technician

## 2013-05-22 NOTE — Progress Notes (Addendum)
ekg 12-19-12 epic Chest xray 12-19-12 recommended follow up, will repeat chest xray pre op 05-23-13 Cardiac clearance note dr Maisie Fus folk 05-14-13 on chart Stress test 02-21-12 [facility] on chart

## 2013-05-22 NOTE — Patient Instructions (Addendum)
[address]  05/22/2013   Your procedure is scheduled on: Thursday November 20th  Report to [facility] at 800 AM.  Call this number if you have problems the morning of surgery [phone]   Remember: BRING CPAP  MASK AND TUBING   Do not eat food or drink liquids :After Midnight.     Take these medicines the morning of surgery with A SIP OF WATER: carvedilol, atorvastatin, levothyroxine, escipalopram                                SEE [place] PREPARING FOR SURGERY SHEET             You may not have any metal on your body including hair pins and piercings  Do not wear jewelry, make-up.  Do not wear lotions, powders, or perfumes. You may wear deodorant.   Men may shave face and neck.  Do not bring valuables to the hospital. [facility] IS NOT RESPONSIBLE FOR VALUEABLES.  Contacts, dentures or bridgework may not be worn into surgery.  Leave suitcase in the car. After surgery it may be brought to your room.  For patients admitted to the hospital, checkout time is 11:00 AM the day of discharge.    Please read over the following fact sheets that you were given:   Call Cain Sieve RN pre op nurse if needed [phone]    FAILURE TO FOLLOW THESE INSTRUCTIONS MAY RESULT IN THE CANCELLATION OF YOUR SURGERY.  PATIENT SIGNATURE___________________________________________  NURSE SIGNATURE_____________________________________________

## 2013-05-23 ENCOUNTER — Encounter (HOSPITAL_COMMUNITY): Payer: Self-pay

## 2013-05-23 ENCOUNTER — Ambulatory Visit (HOSPITAL_COMMUNITY)
Admission: RE | Admit: 2013-05-23 | Discharge: 2013-05-23 | Disposition: A | Discharge status: Home / Self Care | Payer: Medicare Other | Source: Ambulatory Visit | Attending: Urology | Admitting: Urology

## 2013-05-23 ENCOUNTER — Encounter (HOSPITAL_COMMUNITY)
Admission: RE | Admit: 2013-05-23 | Discharge: 2013-05-23 | Disposition: A | Discharge status: Home / Self Care | Payer: Medicare Other | Source: Ambulatory Visit | Attending: Urology | Admitting: Urology

## 2013-05-23 DIAGNOSIS — Z01812 Encounter for preprocedural laboratory examination: Secondary | ICD-10-CM | POA: Diagnosis not present

## 2013-05-23 DIAGNOSIS — J9819 Other pulmonary collapse: Secondary | ICD-10-CM | POA: Diagnosis not present

## 2013-05-23 DIAGNOSIS — Z01818 Encounter for other preprocedural examination: Secondary | ICD-10-CM | POA: Insufficient documentation

## 2013-05-23 DIAGNOSIS — N320 Bladder-neck obstruction: Secondary | ICD-10-CM | POA: Diagnosis not present

## 2013-05-23 HISTORY — DX: Elevated prostate specific antigen (PSA): R97.20

## 2013-05-23 HISTORY — DX: Lipoprotein deficiency: E78.6

## 2013-05-23 HISTORY — DX: Atherosclerotic heart disease of native coronary artery without angina pectoris: I25.10

## 2013-05-23 LAB — CBC
HCT: 43.3 % (ref 39.0–52.0)
Hemoglobin: 14.6 g/dL (ref 13.0–17.0)
MCH: 27.9 pg (ref 26.0–34.0)
MCHC: 33.7 g/dL (ref 30.0–36.0)
MCV: 82.6 fL (ref 78.0–100.0)
Platelets: 177 10*3/uL (ref 150–400)
RBC: 5.24 MIL/uL (ref 4.22–5.81)
RDW: 13.6 % (ref 11.5–15.5)
WBC: 9 10*3/uL (ref 4.0–10.5)

## 2013-05-23 LAB — BASIC METABOLIC PANEL
BUN: 20 mg/dL (ref 6–23)
CO2: 31 mEq/L (ref 19–32)
Calcium: 9.8 mg/dL (ref 8.4–10.5)
Chloride: 100 mEq/L (ref 96–112)
Creatinine, Ser: 1.23 mg/dL (ref 0.50–1.35)
GFR calc Af Amer: 68 mL/min — ABNORMAL LOW (ref >90)
GFR calc non Af Amer: 59 mL/min — ABNORMAL LOW (ref >90)
Glucose, Bld: 99 mg/dL (ref 70–99)
Potassium: 3.6 mEq/L (ref 3.5–5.1)
Sodium: 137 mEq/L (ref 135–145)

## 2013-05-24 DIAGNOSIS — M549 Dorsalgia, unspecified: Secondary | ICD-10-CM | POA: Diagnosis not present

## 2013-05-29 ENCOUNTER — Other Ambulatory Visit: Payer: Self-pay | Admitting: Neurological Surgery

## 2013-05-29 DIAGNOSIS — M549 Dorsalgia, unspecified: Secondary | ICD-10-CM

## 2013-05-29 MED ORDER — CEFAZOLIN SODIUM 10 G IJ SOLR
3.0000 g | INTRAMUSCULAR | Status: DC
  Filled 2013-05-29: qty 3000

## 2013-05-29 MED ORDER — DEXTROSE 5 % IV SOLN
3.0000 g | INTRAVENOUS | Status: AC
  Administered 2013-05-30: 3 g via INTRAVENOUS
  Filled 2013-05-29 (×2): qty 3000

## 2013-05-30 ENCOUNTER — Ambulatory Visit (HOSPITAL_COMMUNITY): Payer: Medicare Other | Admitting: Anesthesiology

## 2013-05-30 ENCOUNTER — Encounter (HOSPITAL_COMMUNITY): Payer: Medicare Other | Admitting: Anesthesiology

## 2013-05-30 ENCOUNTER — Encounter (HOSPITAL_COMMUNITY)
Admission: RE | Disposition: A | Discharge status: Home / Self Care | Payer: Self-pay | Source: Ambulatory Visit | Attending: Urology

## 2013-05-30 ENCOUNTER — Encounter (HOSPITAL_COMMUNITY): Payer: Self-pay

## 2013-05-30 ENCOUNTER — Ambulatory Visit (HOSPITAL_COMMUNITY)
Admission: RE | Admit: 2013-05-30 | Discharge: 2013-05-30 | Disposition: A | Discharge status: Home / Self Care | Payer: Medicare Other | Source: Ambulatory Visit | Attending: Urology | Admitting: Urology

## 2013-05-30 DIAGNOSIS — Z8673 Personal history of transient ischemic attack (TIA), and cerebral infarction without residual deficits: Secondary | ICD-10-CM | POA: Insufficient documentation

## 2013-05-30 DIAGNOSIS — E0590 Thyrotoxicosis, unspecified without thyrotoxic crisis or storm: Secondary | ICD-10-CM | POA: Diagnosis not present

## 2013-05-30 DIAGNOSIS — IMO0002 Reserved for concepts with insufficient information to code with codable children: Secondary | ICD-10-CM | POA: Diagnosis not present

## 2013-05-30 DIAGNOSIS — G4730 Sleep apnea, unspecified: Secondary | ICD-10-CM | POA: Insufficient documentation

## 2013-05-30 DIAGNOSIS — N3289 Other specified disorders of bladder: Secondary | ICD-10-CM | POA: Insufficient documentation

## 2013-05-30 DIAGNOSIS — N320 Bladder-neck obstruction: Secondary | ICD-10-CM | POA: Insufficient documentation

## 2013-05-30 DIAGNOSIS — N3946 Mixed incontinence: Secondary | ICD-10-CM | POA: Insufficient documentation

## 2013-05-30 DIAGNOSIS — Z96659 Presence of unspecified artificial knee joint: Secondary | ICD-10-CM | POA: Diagnosis not present

## 2013-05-30 DIAGNOSIS — E7800 Pure hypercholesterolemia, unspecified: Secondary | ICD-10-CM | POA: Insufficient documentation

## 2013-05-30 DIAGNOSIS — Z79899 Other long term (current) drug therapy: Secondary | ICD-10-CM | POA: Insufficient documentation

## 2013-05-30 DIAGNOSIS — I10 Essential (primary) hypertension: Secondary | ICD-10-CM | POA: Diagnosis not present

## 2013-05-30 DIAGNOSIS — E039 Hypothyroidism, unspecified: Secondary | ICD-10-CM | POA: Diagnosis not present

## 2013-05-30 DIAGNOSIS — I252 Old myocardial infarction: Secondary | ICD-10-CM | POA: Insufficient documentation

## 2013-05-30 DIAGNOSIS — Z87440 Personal history of urinary (tract) infections: Secondary | ICD-10-CM | POA: Insufficient documentation

## 2013-05-30 HISTORY — PX: TRANSURETHRAL RESECTION OF BLADDER TUMOR: SHX2575

## 2013-05-30 SURGERY — TURBT (TRANSURETHRAL RESECTION OF BLADDER TUMOR)
Anesthesia: General | Wound class: Clean Contaminated

## 2013-05-30 MED ORDER — FENTANYL CITRATE 0.05 MG/ML IJ SOLN
INTRAMUSCULAR | Status: AC
  Filled 2013-05-30: qty 5

## 2013-05-30 MED ORDER — ONDANSETRON HCL 4 MG/2ML IJ SOLN
INTRAMUSCULAR | Status: DC | PRN
  Administered 2013-05-30: 4 mg via INTRAVENOUS

## 2013-05-30 MED ORDER — PHENAZOPYRIDINE HCL 200 MG PO TABS: 200.0000 mg | ORAL_TABLET | Freq: Three times a day (TID) | ORAL | Status: DC | PRN

## 2013-05-30 MED ORDER — SCOPOLAMINE 1 MG/3DAYS TD PT72
MEDICATED_PATCH | TRANSDERMAL | Status: DC | PRN
  Administered 2013-05-30: 1 via TRANSDERMAL

## 2013-05-30 MED ORDER — ONDANSETRON HCL 4 MG/2ML IJ SOLN
INTRAMUSCULAR | Status: AC
  Filled 2013-05-30: qty 2

## 2013-05-30 MED ORDER — SODIUM CHLORIDE 0.9 % IR SOLN
Status: DC | PRN
  Administered 2013-05-30: 4000 mL

## 2013-05-30 MED ORDER — SCOPOLAMINE 1 MG/3DAYS TD PT72
1.0000 | MEDICATED_PATCH | Freq: Once | TRANSDERMAL | Status: DC
  Administered 2013-05-30: 1.5 mg via TRANSDERMAL
  Filled 2013-05-30: qty 1

## 2013-05-30 MED ORDER — PROPOFOL 10 MG/ML IV BOLUS
INTRAVENOUS | Status: DC | PRN
  Administered 2013-05-30: 220 mg via INTRAVENOUS

## 2013-05-30 MED ORDER — PROPOFOL 10 MG/ML IV BOLUS
INTRAVENOUS | Status: AC
  Filled 2013-05-30: qty 20

## 2013-05-30 MED ORDER — MIDAZOLAM HCL 2 MG/2ML IJ SOLN
INTRAMUSCULAR | Status: AC
  Filled 2013-05-30: qty 2

## 2013-05-30 MED ORDER — LIDOCAINE HCL (CARDIAC) 20 MG/ML IV SOLN
INTRAVENOUS | Status: DC | PRN
  Administered 2013-05-30: 75 mg via INTRAVENOUS

## 2013-05-30 MED ORDER — HYDROMORPHONE HCL PF 1 MG/ML IJ SOLN: 0.2500 mg | INTRAMUSCULAR | Status: DC | PRN

## 2013-05-30 MED ORDER — EPHEDRINE SULFATE 50 MG/ML IJ SOLN
INTRAMUSCULAR | Status: AC
  Filled 2013-05-30: qty 1

## 2013-05-30 MED ORDER — DEXAMETHASONE SODIUM PHOSPHATE 10 MG/ML IJ SOLN
INTRAMUSCULAR | Status: AC
  Filled 2013-05-30: qty 2

## 2013-05-30 MED ORDER — LACTATED RINGERS IV SOLN
INTRAVENOUS | Status: DC
  Administered 2013-05-30: 12:00:00 via INTRAVENOUS
  Administered 2013-05-30: 1000 mL via INTRAVENOUS

## 2013-05-30 MED ORDER — PROMETHAZINE HCL 25 MG/ML IJ SOLN: 6.2500 mg | INTRAMUSCULAR | Status: DC | PRN

## 2013-05-30 MED ORDER — BELLADONNA ALKALOIDS-OPIUM 16.2-60 MG RE SUPP
RECTAL | Status: AC
  Filled 2013-05-30: qty 1

## 2013-05-30 MED ORDER — LIDOCAINE HCL (CARDIAC) 20 MG/ML IV SOLN
INTRAVENOUS | Status: AC
  Filled 2013-05-30: qty 5

## 2013-05-30 MED ORDER — LIDOCAINE HCL 2 % EX GEL
CUTANEOUS | Status: AC
  Filled 2013-05-30: qty 10

## 2013-05-30 MED ORDER — MIDAZOLAM HCL 5 MG/5ML IJ SOLN
INTRAMUSCULAR | Status: DC | PRN
  Administered 2013-05-30 (×2): 1 mg via INTRAVENOUS

## 2013-05-30 MED ORDER — SULFAMETHOXAZOLE-TMP DS 800-160 MG PO TABS: 1.0000 | ORAL_TABLET | Freq: Two times a day (BID) | ORAL | Status: DC

## 2013-05-30 MED ORDER — LACTATED RINGERS IV SOLN: INTRAVENOUS | Status: DC

## 2013-05-30 MED ORDER — SCOPOLAMINE 1 MG/3DAYS TD PT72
MEDICATED_PATCH | TRANSDERMAL | Status: AC
  Filled 2013-05-30: qty 1

## 2013-05-30 MED ORDER — FENTANYL CITRATE 0.05 MG/ML IJ SOLN
INTRAMUSCULAR | Status: DC | PRN
  Administered 2013-05-30: 150 ug via INTRAVENOUS

## 2013-05-30 MED ORDER — DEXAMETHASONE SODIUM PHOSPHATE 10 MG/ML IJ SOLN
INTRAMUSCULAR | Status: DC | PRN
  Administered 2013-05-30: 10 mg via INTRAVENOUS

## 2013-05-30 MED ORDER — STERILE WATER FOR IRRIGATION IR SOLN
Status: DC | PRN
  Administered 2013-05-30: 500 mL

## 2013-05-30 SURGICAL SUPPLY — 22 items
BAG URINE DRAINAGE (UROLOGICAL SUPPLIES) ×2 IMPLANT
BAG URO CATCHER STRL LF (DRAPE) ×2 IMPLANT
CATH FOLEY 2WAY SLVR 30CC 20FR (CATHETERS) ×2 IMPLANT
DRAPE CAMERA CLOSED 9X96 (DRAPES) ×2 IMPLANT
ELECT BUTTON HF 24-28F 2 30DE (ELECTRODE) IMPLANT
ELECT LOOP MED HF 24F 12D (CUTTING LOOP) IMPLANT
ELECT LOOP MED HF 24F 12D CBL (CLIP) IMPLANT
ELECT RESECT VAPORIZE 12D CBL (ELECTRODE) ×2 IMPLANT
GLOVE BIOGEL M STRL SZ7.5 (GLOVE) ×2 IMPLANT
GOWN PREVENTION PLUS LG XLONG (DISPOSABLE) IMPLANT
GOWN STRL NON-REIN LRG LVL3 (GOWN DISPOSABLE) ×2 IMPLANT
GOWN STRL REIN XL XLG (GOWN DISPOSABLE) IMPLANT
HOLDER FOLEY CATH W/STRAP (MISCELLANEOUS) IMPLANT
IV NS IRRIG 3000ML ARTHROMATIC (IV SOLUTION) IMPLANT
KIT ASPIRATION TUBING (SET/KITS/TRAYS/PACK) ×2 IMPLANT
MANIFOLD NEPTUNE II (INSTRUMENTS) ×2 IMPLANT
NS IRRIG 1000ML POUR BTL (IV SOLUTION) IMPLANT
PACK CYSTO (CUSTOM PROCEDURE TRAY) ×2 IMPLANT
SCRUB PCMX 4 OZ (MISCELLANEOUS) IMPLANT
SYR 30ML LL (SYRINGE) ×2 IMPLANT
SYRINGE IRR TOOMEY STRL 70CC (SYRINGE) IMPLANT
TUBING CONNECTING 10 (TUBING) ×2 IMPLANT

## 2013-05-30 NOTE — Interval H&P Note (Signed)
History and Physical Interval Note:  05/30/2013 8:44 AM  Dennis [name]  has presented today for surgery, with the diagnosis of BLADDER NECK CONTRACTURE  The various methods of treatment have been discussed with the patient and family. After consideration of risks, benefits and other options for treatment, the patient has consented to  Procedure(s): CYSTOSCOPY GYRUS BUTTON VAPORIZATION OF BLADDER NECK CONTRACTURE (N/A) BLADDER NECK REPAIR (N/A) as a surgical intervention .  The patient's history has been reviewed, patient examined, no change in status, stable for surgery.  I have reviewed the patient's chart and labs.  Questions were answered to the patient's satisfaction.     Jethro Bolus I
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
## 2013-05-30 NOTE — Anesthesia Procedure Notes (Signed)
Procedure Name: LMA Insertion Date/Time: 05/30/2013 11:32 AM Performed by: Edison Pace Pre-anesthesia Checklist: Patient identified, Timeout performed, Emergency Drugs available, Suction available and Patient being monitored Patient Re-evaluated:Patient Re-evaluated prior to inductionOxygen Delivery Method: Circle system utilized Preoxygenation: Pre-oxygenation with 100% oxygen Intubation Type: IV induction LMA: LMA inserted LMA Size: 5.0 Laryngoscope size: tried no. 4 proseal inadequate seal.  removed, no 5 regular LMA  Number of attempts: 2 Placement Confirmation: positive ETCO2 Tube secured with: Tape Dental Injury: Teeth and Oropharynx as per pre-operative assessment

## 2013-05-30 NOTE — Anesthesia Postprocedure Evaluation (Signed)
Anesthesia Post Note  Patient: Dennis [name]  Procedure(s) Performed: Procedure(s) (LRB): CYSTOSCOPY GYRUS BUTTON VAPORIZATION OF BLADDER NECK CONTRACTURE (N/A)  Anesthesia type: General  Patient location: PACU  Post pain: Pain level controlled  Post assessment: Post-op Vital signs reviewed  Last Vitals:  Filed Vitals:   05/30/13 1245  BP: 145/119  Pulse: 58  Temp:   Resp: 16    Post vital signs: Reviewed  Level of consciousness: sedated  Complications: No apparent anesthesia complications

## 2013-05-30 NOTE — Progress Notes (Signed)
IV Dced w cath intact. Pressure dsg applied. No IV to chart against in EPIC

## 2013-05-30 NOTE — Preoperative (Signed)
Beta Blockers   Reason not to administer Beta Blockers:Not Applicable 

## 2013-05-30 NOTE — Progress Notes (Signed)
Family has CPAP  Mask and hose
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
## 2013-05-30 NOTE — Transfer of Care (Signed)
Immediate Anesthesia Transfer of Care Note  Patient: Dennis [name]  Procedure(s) Performed: Procedure(s): CYSTOSCOPY GYRUS BUTTON VAPORIZATION OF BLADDER NECK CONTRACTURE (N/A)  Patient Location: PACU  Anesthesia Type:General  Level of Consciousness: awake, alert , oriented and patient cooperative  Airway & Oxygen Therapy: Patient Spontanous Breathing and Patient connected to face mask oxygen  Post-op Assessment: Report given to PACU RN, Post -op Vital signs reviewed and stable and Patient moving all extremities  Post vital signs: Reviewed and stable  Complications: No apparent anesthesia complications

## 2013-05-30 NOTE — Progress Notes (Signed)
Called Dr Patsi Sears to clarify DC instructions.

## 2013-05-30 NOTE — Anesthesia Preprocedure Evaluation (Signed)
Anesthesia Evaluation  Patient identified by MRN, date of birth, ID band Patient awake and Patient confused    Reviewed: Allergy & Precautions, H&P , NPO status , Patient's Chart, lab work & pertinent test results, reviewed documented beta blocker date and time   History of Anesthesia Complications (+) PONV and history of anesthetic complications  Airway Mallampati: I TM Distance: >3 FB Neck ROM: Full    Dental  (+) Teeth Intact, Dental Advisory Given and Chipped,    Pulmonary sleep apnea and Continuous Positive Airway Pressure Ventilation , pneumonia -,  breath sounds clear to auscultation  Pulmonary exam normal       Cardiovascular hypertension, Pt. on medications and Pt. on home beta blockers + CAD and + Past MI Rhythm:Regular Rate:Normal  Nuclear stress test 3 weeks ago -- clean.   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 CVA, No Residual Symptoms    GI/Hepatic negative GI ROS, Neg liver ROS,   Endo/Other  Hypothyroidism Morbid obesity  Renal/GU negative Renal ROS     Musculoskeletal   Abdominal (+) + obese,   Peds  Hematology negative hematology ROS (+)   Anesthesia Other Findings   Reproductive/Obstetrics                           Anesthesia Physical Anesthesia Plan  ASA: III  Anesthesia Plan: General   Post-op Pain Management:    Induction: Intravenous  Airway Management Planned: LMA  Additional Equipment:   Intra-op Plan:   Post-operative Plan: Extubation in OR  Informed Consent: I have reviewed the patients History and Physical, chart, labs and discussed the procedure including the risks, benefits and alternatives for the proposed anesthesia with the patient or authorized representative who has indicated his/her understanding and acceptance.   Dental advisory given  Plan Discussed with: CRNA  Anesthesia Plan Comments:         Anesthesia Quick  Evaluation

## 2013-05-31 ENCOUNTER — Encounter (HOSPITAL_COMMUNITY): Payer: Self-pay | Admitting: Urology

## 2013-06-04 ENCOUNTER — Ambulatory Visit
Admission: RE | Admit: 2013-06-04 | Discharge: 2013-06-04 | Disposition: A | Discharge status: Home / Self Care | Payer: Medicare Other | Source: Ambulatory Visit | Attending: Neurological Surgery | Admitting: Neurological Surgery

## 2013-06-04 VITALS — BP 158/98 | HR 74 | Ht 79.0 in | Wt 353.4 lb

## 2013-06-04 DIAGNOSIS — M549 Dorsalgia, unspecified: Secondary | ICD-10-CM

## 2013-06-04 DIAGNOSIS — M5126 Other intervertebral disc displacement, lumbar region: Secondary | ICD-10-CM | POA: Diagnosis not present

## 2013-06-04 DIAGNOSIS — G8929 Other chronic pain: Secondary | ICD-10-CM

## 2013-06-04 DIAGNOSIS — M4310 Spondylolisthesis, site unspecified: Secondary | ICD-10-CM | POA: Diagnosis not present

## 2013-06-04 MED ORDER — DIAZEPAM 5 MG PO TABS
5.0000 mg | ORAL_TABLET | Freq: Once | ORAL | Status: AC
  Administered 2013-06-04: 10 mg via ORAL

## 2013-06-04 MED ORDER — IOHEXOL 180 MG/ML  SOLN
15.0000 mL | Freq: Once | INTRAMUSCULAR | Status: AC | PRN
  Administered 2013-06-04: 18 mL via INTRATHECAL

## 2013-06-04 NOTE — Progress Notes (Signed)
Wife called discharge time.

## 2013-06-04 NOTE — Progress Notes (Signed)
Patient states he has been off Lexapro for the past two days.

## 2013-06-10 DIAGNOSIS — N3020 Other chronic cystitis without hematuria: Secondary | ICD-10-CM | POA: Diagnosis not present

## 2013-06-11 ENCOUNTER — Other Ambulatory Visit: Payer: Medicare Other

## 2013-06-11 DIAGNOSIS — M549 Dorsalgia, unspecified: Secondary | ICD-10-CM | POA: Diagnosis not present

## 2013-06-25 ENCOUNTER — Other Ambulatory Visit: Payer: Self-pay | Admitting: Neurological Surgery

## 2013-06-25 DIAGNOSIS — E669 Obesity, unspecified: Secondary | ICD-10-CM | POA: Diagnosis not present

## 2013-06-25 DIAGNOSIS — IMO0002 Reserved for concepts with insufficient information to code with codable children: Secondary | ICD-10-CM | POA: Diagnosis not present

## 2013-06-25 DIAGNOSIS — I10 Essential (primary) hypertension: Secondary | ICD-10-CM | POA: Diagnosis not present

## 2013-07-11 DIAGNOSIS — C6290 Malignant neoplasm of unspecified testis, unspecified whether descended or undescended: Secondary | ICD-10-CM

## 2013-07-11 HISTORY — DX: Malignant neoplasm of unspecified testis, unspecified whether descended or undescended: C62.90

## 2013-07-16 ENCOUNTER — Encounter (HOSPITAL_COMMUNITY): Payer: Self-pay | Admitting: Pharmacy Technician

## 2013-07-16 MED ORDER — DEXTROSE 5 % IV SOLN
3.0000 g | INTRAVENOUS | Status: AC
  Administered 2013-07-17: 3 g via INTRAVENOUS
  Filled 2013-07-16: qty 3000

## 2013-07-17 ENCOUNTER — Inpatient Hospital Stay (HOSPITAL_COMMUNITY): Payer: Medicare Other | Admitting: Certified Registered"

## 2013-07-17 ENCOUNTER — Encounter (HOSPITAL_COMMUNITY): Payer: Self-pay | Admitting: Neurological Surgery

## 2013-07-17 ENCOUNTER — Inpatient Hospital Stay (HOSPITAL_COMMUNITY): Payer: Medicare Other

## 2013-07-17 ENCOUNTER — Encounter (HOSPITAL_COMMUNITY): Payer: Medicare Other | Admitting: Certified Registered"

## 2013-07-17 ENCOUNTER — Inpatient Hospital Stay (HOSPITAL_COMMUNITY)
Admission: RE | Admit: 2013-07-17 | Discharge: 2013-07-19 | DRG: 460 | Disposition: A | Discharge status: Home / Self Care | Payer: Medicare Other | Source: Ambulatory Visit | Attending: Neurological Surgery | Admitting: Neurological Surgery

## 2013-07-17 ENCOUNTER — Encounter (HOSPITAL_COMMUNITY)
Admission: RE | Disposition: A | Discharge status: Home / Self Care | Payer: Self-pay | Source: Ambulatory Visit | Attending: Neurological Surgery

## 2013-07-17 DIAGNOSIS — Q762 Congenital spondylolisthesis: Secondary | ICD-10-CM | POA: Diagnosis not present

## 2013-07-17 DIAGNOSIS — Z981 Arthrodesis status: Secondary | ICD-10-CM

## 2013-07-17 DIAGNOSIS — Z96659 Presence of unspecified artificial knee joint: Secondary | ICD-10-CM | POA: Diagnosis not present

## 2013-07-17 DIAGNOSIS — Z8673 Personal history of transient ischemic attack (TIA), and cerebral infarction without residual deficits: Secondary | ICD-10-CM | POA: Diagnosis not present

## 2013-07-17 DIAGNOSIS — M129 Arthropathy, unspecified: Secondary | ICD-10-CM | POA: Diagnosis present

## 2013-07-17 DIAGNOSIS — Z79899 Other long term (current) drug therapy: Secondary | ICD-10-CM

## 2013-07-17 DIAGNOSIS — I10 Essential (primary) hypertension: Secondary | ICD-10-CM | POA: Diagnosis present

## 2013-07-17 DIAGNOSIS — I252 Old myocardial infarction: Secondary | ICD-10-CM

## 2013-07-17 DIAGNOSIS — Z91013 Allergy to seafood: Secondary | ICD-10-CM

## 2013-07-17 DIAGNOSIS — G8929 Other chronic pain: Secondary | ICD-10-CM | POA: Diagnosis present

## 2013-07-17 DIAGNOSIS — M48061 Spinal stenosis, lumbar region without neurogenic claudication: Secondary | ICD-10-CM | POA: Diagnosis present

## 2013-07-17 DIAGNOSIS — M109 Gout, unspecified: Secondary | ICD-10-CM | POA: Diagnosis present

## 2013-07-17 DIAGNOSIS — I2510 Atherosclerotic heart disease of native coronary artery without angina pectoris: Secondary | ICD-10-CM | POA: Diagnosis present

## 2013-07-17 DIAGNOSIS — E786 Lipoprotein deficiency: Secondary | ICD-10-CM | POA: Diagnosis present

## 2013-07-17 DIAGNOSIS — Z91038 Other insect allergy status: Secondary | ICD-10-CM | POA: Diagnosis not present

## 2013-07-17 DIAGNOSIS — G4733 Obstructive sleep apnea (adult) (pediatric): Secondary | ICD-10-CM | POA: Diagnosis present

## 2013-07-17 DIAGNOSIS — M519 Unspecified thoracic, thoracolumbar and lumbosacral intervertebral disc disorder: Secondary | ICD-10-CM | POA: Diagnosis not present

## 2013-07-17 DIAGNOSIS — E039 Hypothyroidism, unspecified: Secondary | ICD-10-CM | POA: Diagnosis not present

## 2013-07-17 DIAGNOSIS — M545 Low back pain, unspecified: Secondary | ICD-10-CM | POA: Diagnosis not present

## 2013-07-17 DIAGNOSIS — M4310 Spondylolisthesis, site unspecified: Secondary | ICD-10-CM | POA: Diagnosis not present

## 2013-07-17 DIAGNOSIS — IMO0002 Reserved for concepts with insufficient information to code with codable children: Secondary | ICD-10-CM | POA: Diagnosis not present

## 2013-07-17 HISTORY — PX: MAXIMUM ACCESS (MAS)POSTERIOR LUMBAR INTERBODY FUSION (PLIF) 1 LEVEL: SHX6368

## 2013-07-17 LAB — CBC WITH DIFFERENTIAL/PLATELET
Basophils Absolute: 0 10*3/uL (ref 0.0–0.1)
Basophils Relative: 0 % (ref 0–1)
Eosinophils Absolute: 0.1 10*3/uL (ref 0.0–0.7)
Eosinophils Relative: 3 % (ref 0–5)
HCT: 40.2 % (ref 39.0–52.0)
Hemoglobin: 13.9 g/dL (ref 13.0–17.0)
Lymphocytes Relative: 21 % (ref 12–46)
Lymphs Abs: 1.1 10*3/uL (ref 0.7–4.0)
MCH: 28.7 pg (ref 26.0–34.0)
MCHC: 34.6 g/dL (ref 30.0–36.0)
MCV: 83.1 fL (ref 78.0–100.0)
Monocytes Absolute: 0.3 10*3/uL (ref 0.1–1.0)
Monocytes Relative: 6 % (ref 3–12)
Neutro Abs: 3.7 10*3/uL (ref 1.7–7.7)
Neutrophils Relative %: 70 % (ref 43–77)
Platelets: 142 10*3/uL — ABNORMAL LOW (ref 150–400)
RBC: 4.84 MIL/uL (ref 4.22–5.81)
RDW: 13.6 % (ref 11.5–15.5)
WBC: 5.3 10*3/uL (ref 4.0–10.5)

## 2013-07-17 LAB — PROTIME-INR
INR: 0.97 (ref 0.00–1.49)
Prothrombin Time: 12.7 seconds (ref 11.6–15.2)

## 2013-07-17 LAB — BASIC METABOLIC PANEL
BUN: 17 mg/dL (ref 6–23)
CO2: 28 mEq/L (ref 19–32)
Calcium: 9.2 mg/dL (ref 8.4–10.5)
Chloride: 101 mEq/L (ref 96–112)
Creatinine, Ser: 1.05 mg/dL (ref 0.50–1.35)
GFR calc Af Amer: 83 mL/min — ABNORMAL LOW (ref >90)
GFR calc non Af Amer: 71 mL/min — ABNORMAL LOW (ref >90)
Glucose, Bld: 90 mg/dL (ref 70–99)
Potassium: 4.3 mEq/L (ref 3.7–5.3)
Sodium: 140 mEq/L (ref 137–147)

## 2013-07-17 LAB — TYPE AND SCREEN
ABO/RH(D): O POS
Antibody Screen: NEGATIVE

## 2013-07-17 LAB — SURGICAL PCR SCREEN
MRSA, PCR: NEGATIVE
Staphylococcus aureus: NEGATIVE

## 2013-07-17 SURGERY — FOR MAXIMUM ACCESS (MAS) POSTERIOR LUMBAR INTERBODY FUSION (PLIF) 1 LEVEL
Anesthesia: General | Site: Back

## 2013-07-17 MED ORDER — ONDANSETRON HCL 4 MG/2ML IJ SOLN: 4.0000 mg | Freq: Once | INTRAMUSCULAR | Status: DC | PRN

## 2013-07-17 MED ORDER — FUROSEMIDE 20 MG PO TABS
20.0000 mg | ORAL_TABLET | Freq: Two times a day (BID) | ORAL | Status: DC
  Administered 2013-07-17 – 2013-07-18 (×3): 20 mg via ORAL
  Filled 2013-07-17 (×6): qty 1

## 2013-07-17 MED ORDER — OXYCODONE-ACETAMINOPHEN 5-325 MG PO TABS: 2.0000 | ORAL_TABLET | Freq: Once | ORAL | Status: DC

## 2013-07-17 MED ORDER — CELECOXIB 200 MG PO CAPS
200.0000 mg | ORAL_CAPSULE | Freq: Two times a day (BID) | ORAL | Status: DC
  Administered 2013-07-17 – 2013-07-19 (×4): 200 mg via ORAL
  Filled 2013-07-17 (×5): qty 1

## 2013-07-17 MED ORDER — METHOCARBAMOL 100 MG/ML IJ SOLN
500.0000 mg | Freq: Four times a day (QID) | INTRAVENOUS | Status: DC | PRN
  Filled 2013-07-17: qty 5

## 2013-07-17 MED ORDER — ALLOPURINOL 100 MG PO TABS
100.0000 mg | ORAL_TABLET | Freq: Two times a day (BID) | ORAL | Status: DC
  Administered 2013-07-17 – 2013-07-19 (×4): 100 mg via ORAL
  Filled 2013-07-17 (×5): qty 1

## 2013-07-17 MED ORDER — MUPIROCIN 2 % EX OINT
TOPICAL_OINTMENT | CUTANEOUS | Status: AC
  Filled 2013-07-17: qty 22

## 2013-07-17 MED ORDER — ESCITALOPRAM OXALATE 20 MG PO TABS
20.0000 mg | ORAL_TABLET | Freq: Every morning | ORAL | Status: DC
  Administered 2013-07-18 – 2013-07-19 (×2): 20 mg via ORAL
  Filled 2013-07-17 (×2): qty 1

## 2013-07-17 MED ORDER — OXYCODONE-ACETAMINOPHEN 5-325 MG PO TABS: 1.0000 | ORAL_TABLET | ORAL | Status: DC | PRN

## 2013-07-17 MED ORDER — EPHEDRINE SULFATE 50 MG/ML IJ SOLN
INTRAMUSCULAR | Status: DC | PRN
  Administered 2013-07-17: 10 mg via INTRAVENOUS
  Administered 2013-07-17: 5 mg via INTRAVENOUS
  Administered 2013-07-17: 25 mg via INTRAVENOUS
  Administered 2013-07-17 (×2): 5 mg via INTRAVENOUS

## 2013-07-17 MED ORDER — HYDROMORPHONE HCL PF 1 MG/ML IJ SOLN: 0.2500 mg | INTRAMUSCULAR | Status: DC | PRN

## 2013-07-17 MED ORDER — LEVOTHYROXINE SODIUM 200 MCG PO TABS
200.0000 ug | ORAL_TABLET | Freq: Every day | ORAL | Status: DC
  Administered 2013-07-18 – 2013-07-19 (×2): 200 ug via ORAL
  Filled 2013-07-17 (×3): qty 1

## 2013-07-17 MED ORDER — DEXAMETHASONE SODIUM PHOSPHATE 10 MG/ML IJ SOLN: 10.0000 mg | INTRAMUSCULAR | Status: DC

## 2013-07-17 MED ORDER — ONDANSETRON HCL 4 MG/2ML IJ SOLN
INTRAMUSCULAR | Status: DC | PRN
  Administered 2013-07-17: 4 mg via INTRAVENOUS

## 2013-07-17 MED ORDER — SODIUM CHLORIDE 0.9 % IJ SOLN
3.0000 mL | Freq: Two times a day (BID) | INTRAMUSCULAR | Status: DC
  Administered 2013-07-18 (×2): 3 mL via INTRAVENOUS

## 2013-07-17 MED ORDER — METOLAZONE 2.5 MG PO TABS
2.5000 mg | ORAL_TABLET | Freq: Every day | ORAL | Status: DC
  Administered 2013-07-17 – 2013-07-19 (×3): 2.5 mg via ORAL
  Filled 2013-07-17 (×3): qty 1

## 2013-07-17 MED ORDER — SODIUM CHLORIDE 0.9 % IR SOLN
Status: DC | PRN
  Administered 2013-07-17: 13:00:00

## 2013-07-17 MED ORDER — MENTHOL 3 MG MT LOZG: 1.0000 | LOZENGE | OROMUCOSAL | Status: DC | PRN

## 2013-07-17 MED ORDER — METHOCARBAMOL 500 MG PO TABS: 500.0000 mg | ORAL_TABLET | Freq: Four times a day (QID) | ORAL | Status: DC | PRN

## 2013-07-17 MED ORDER — LIDOCAINE HCL (CARDIAC) 20 MG/ML IV SOLN
INTRAVENOUS | Status: DC | PRN
  Administered 2013-07-17: 100 mg via INTRAVENOUS

## 2013-07-17 MED ORDER — CARVEDILOL 6.25 MG PO TABS
6.2500 mg | ORAL_TABLET | Freq: Two times a day (BID) | ORAL | Status: DC
Start: 2013-07-17 — End: 2013-07-19
  Administered 2013-07-17 – 2013-07-19 (×5): 6.25 mg via ORAL
  Filled 2013-07-17 (×7): qty 1

## 2013-07-17 MED ORDER — CARVEDILOL 6.25 MG PO TABS
6.2500 mg | ORAL_TABLET | Freq: Two times a day (BID) | ORAL | Status: DC
  Filled 2013-07-17 (×4): qty 1

## 2013-07-17 MED ORDER — ACETAMINOPHEN 650 MG RE SUPP: 650.0000 mg | RECTAL | Status: DC | PRN

## 2013-07-17 MED ORDER — PROPOFOL 10 MG/ML IV BOLUS
INTRAVENOUS | Status: DC | PRN
  Administered 2013-07-17: 60 mg via INTRAVENOUS
  Administered 2013-07-17: 400 mg via INTRAVENOUS

## 2013-07-17 MED ORDER — PROPOFOL INFUSION 10 MG/ML OPTIME
INTRAVENOUS | Status: DC | PRN
  Administered 2013-07-17: 100 ug/kg/min via INTRAVENOUS

## 2013-07-17 MED ORDER — SODIUM CHLORIDE 0.9 % IJ SOLN: 3.0000 mL | INTRAMUSCULAR | Status: DC | PRN

## 2013-07-17 MED ORDER — CARVEDILOL 12.5 MG PO TABS
ORAL_TABLET | ORAL | Status: AC
  Filled 2013-07-17: qty 1

## 2013-07-17 MED ORDER — SUCCINYLCHOLINE CHLORIDE 20 MG/ML IJ SOLN
INTRAMUSCULAR | Status: DC | PRN
  Administered 2013-07-17: 140 mg via INTRAVENOUS

## 2013-07-17 MED ORDER — PHENOL 1.4 % MT LIQD: 1.0000 | OROMUCOSAL | Status: DC | PRN

## 2013-07-17 MED ORDER — BUPIVACAINE HCL (PF) 0.25 % IJ SOLN
INTRAMUSCULAR | Status: DC | PRN
  Administered 2013-07-17: 5 mL

## 2013-07-17 MED ORDER — ARTIFICIAL TEARS OP OINT
TOPICAL_OINTMENT | OPHTHALMIC | Status: DC | PRN
  Administered 2013-07-17: 1 via OPHTHALMIC

## 2013-07-17 MED ORDER — ACETAMINOPHEN 325 MG PO TABS: 650.0000 mg | ORAL_TABLET | ORAL | Status: DC | PRN

## 2013-07-17 MED ORDER — CEFAZOLIN SODIUM 1-5 GM-% IV SOLN
1.0000 g | Freq: Three times a day (TID) | INTRAVENOUS | Status: AC
  Administered 2013-07-17 – 2013-07-18 (×2): 1 g via INTRAVENOUS
  Filled 2013-07-17 (×2): qty 50

## 2013-07-17 MED ORDER — POTASSIUM CHLORIDE IN NACL 20-0.9 MEQ/L-% IV SOLN
INTRAVENOUS | Status: DC
  Administered 2013-07-17: 18:00:00 via INTRAVENOUS
  Filled 2013-07-17 (×6): qty 1000

## 2013-07-17 MED ORDER — PHENYLEPHRINE HCL 10 MG/ML IJ SOLN
10.0000 mg | INTRAVENOUS | Status: DC | PRN
  Administered 2013-07-17: 50 ug/min via INTRAVENOUS

## 2013-07-17 MED ORDER — OXYCODONE-ACETAMINOPHEN 5-325 MG PO TABS
ORAL_TABLET | ORAL | Status: AC
  Administered 2013-07-17: 2
  Filled 2013-07-17: qty 2

## 2013-07-17 MED ORDER — MORPHINE SULFATE 2 MG/ML IJ SOLN
1.0000 mg | INTRAMUSCULAR | Status: DC | PRN
  Administered 2013-07-17: 2 mg via INTRAVENOUS
  Filled 2013-07-17: qty 1

## 2013-07-17 MED ORDER — LACTATED RINGERS IV SOLN
INTRAVENOUS | Status: DC
  Administered 2013-07-17 (×2): via INTRAVENOUS

## 2013-07-17 MED ORDER — DEXAMETHASONE SODIUM PHOSPHATE 10 MG/ML IJ SOLN
INTRAMUSCULAR | Status: AC
  Administered 2013-07-17: 10 mg via INTRAVENOUS
  Filled 2013-07-17: qty 1

## 2013-07-17 MED ORDER — DEXAMETHASONE 4 MG PO TABS
4.0000 mg | ORAL_TABLET | Freq: Four times a day (QID) | ORAL | Status: DC
  Administered 2013-07-18 – 2013-07-19 (×6): 4 mg via ORAL
  Filled 2013-07-17 (×10): qty 1

## 2013-07-17 MED ORDER — THROMBIN 20000 UNITS EX SOLR
OROMUCOSAL | Status: DC | PRN
  Administered 2013-07-17: 13:00:00 via TOPICAL

## 2013-07-17 MED ORDER — 0.9 % SODIUM CHLORIDE (POUR BTL) OPTIME
TOPICAL | Status: DC | PRN
  Administered 2013-07-17: 1000 mL

## 2013-07-17 MED ORDER — MUPIROCIN 2 % EX OINT
TOPICAL_OINTMENT | Freq: Two times a day (BID) | CUTANEOUS | Status: DC
Start: 2013-07-17 — End: 2013-07-17

## 2013-07-17 MED ORDER — FENTANYL CITRATE 0.05 MG/ML IJ SOLN
INTRAMUSCULAR | Status: DC | PRN
  Administered 2013-07-17: 150 ug via INTRAVENOUS
  Administered 2013-07-17: 100 ug via INTRAVENOUS
  Administered 2013-07-17 (×2): 50 ug via INTRAVENOUS

## 2013-07-17 MED ORDER — DEXAMETHASONE SODIUM PHOSPHATE 4 MG/ML IJ SOLN
4.0000 mg | Freq: Four times a day (QID) | INTRAMUSCULAR | Status: DC
  Administered 2013-07-17: 4 mg via INTRAVENOUS
  Filled 2013-07-17 (×9): qty 1

## 2013-07-17 MED ORDER — ONDANSETRON HCL 4 MG/2ML IJ SOLN: 4.0000 mg | INTRAMUSCULAR | Status: DC | PRN

## 2013-07-17 MED ORDER — THROMBIN 20000 UNITS EX SOLR
CUTANEOUS | Status: DC | PRN
  Administered 2013-07-17: 13:00:00 via TOPICAL

## 2013-07-17 SURGICAL SUPPLY — 56 items
BAG DECANTER FOR FLEXI CONT (MISCELLANEOUS) ×2 IMPLANT
BENZOIN TINCTURE PRP APPL 2/3 (GAUZE/BANDAGES/DRESSINGS) ×2 IMPLANT
BLADE SURG ROTATE 9660 (MISCELLANEOUS) IMPLANT
BONE MATRIX OSTEOCEL PRO MED (Bone Implant) ×2 IMPLANT
BUR MATCHSTICK NEURO 3.0 LAGG (BURR) ×2 IMPLANT
CANISTER SUCT 3000ML (MISCELLANEOUS) ×2 IMPLANT
CLIP NEUROVISION LG (CLIP) ×2 IMPLANT
CONT SPEC 4OZ CLIKSEAL STRL BL (MISCELLANEOUS) ×4 IMPLANT
COROENT 10X9X28-4 (Spacer) ×4 IMPLANT
COVER BACK TABLE 24X17X13 BIG (DRAPES) IMPLANT
COVER TABLE BACK 60X90 (DRAPES) ×2 IMPLANT
DRAPE C-ARM 42X72 X-RAY (DRAPES) ×2 IMPLANT
DRAPE C-ARMOR (DRAPES) ×2 IMPLANT
DRAPE LAPAROTOMY 100X72X124 (DRAPES) ×2 IMPLANT
DRAPE POUCH INSTRU U-SHP 10X18 (DRAPES) ×2 IMPLANT
DRAPE SURG 17X23 STRL (DRAPES) ×2 IMPLANT
DRESSING TELFA 8X3 (GAUZE/BANDAGES/DRESSINGS) ×2 IMPLANT
DRSG OPSITE 4X5.5 SM (GAUZE/BANDAGES/DRESSINGS) ×2 IMPLANT
DURAPREP 26ML APPLICATOR (WOUND CARE) ×2 IMPLANT
ELECT REM PT RETURN 9FT ADLT (ELECTROSURGICAL) ×2
ELECTRODE REM PT RTRN 9FT ADLT (ELECTROSURGICAL) ×1 IMPLANT
EVACUATOR 1/8 PVC DRAIN (DRAIN) ×2 IMPLANT
GAUZE SPONGE 4X4 16PLY XRAY LF (GAUZE/BANDAGES/DRESSINGS) IMPLANT
GLOVE BIO SURGEON STRL SZ8 (GLOVE) ×4 IMPLANT
GOWN BRE IMP SLV AUR LG STRL (GOWN DISPOSABLE) IMPLANT
GOWN BRE IMP SLV AUR XL STRL (GOWN DISPOSABLE) ×4 IMPLANT
GOWN STRL REIN 2XL LVL4 (GOWN DISPOSABLE) IMPLANT
HEMOSTAT POWDER KIT SURGIFOAM (HEMOSTASIS) ×2 IMPLANT
KIT BASIN OR (CUSTOM PROCEDURE TRAY) ×2 IMPLANT
KIT NEEDLE NVM5 EMG ELECT (KITS) ×1 IMPLANT
KIT NEEDLE NVM5 EMG ELECTRODE (KITS) ×1
KIT ROOM TURNOVER OR (KITS) ×2 IMPLANT
MILL MEDIUM DISP (BLADE) ×2 IMPLANT
NEEDLE HYPO 25X1 1.5 SAFETY (NEEDLE) ×2 IMPLANT
NS IRRIG 1000ML POUR BTL (IV SOLUTION) ×2 IMPLANT
PACK LAMINECTOMY NEURO (CUSTOM PROCEDURE TRAY) ×2 IMPLANT
PAD ARMBOARD 7.5X6 YLW CONV (MISCELLANEOUS) ×6 IMPLANT
ROD PREBENT 45MM LUMBAR (Rod) ×4 IMPLANT
SCREW LOCK (Screw) ×4 IMPLANT
SCREW LOCK FXNS SPNE MAS PL (Screw) ×4 IMPLANT
SCREW PLIF MAS 5.5X35 LUMBAR (Screw) ×4 IMPLANT
SCREW SHANKS 5.5X35 (Screw) ×4 IMPLANT
SCREW TULIP 5.5 (Screw) ×4 IMPLANT
SPONGE LAP 4X18 X RAY DECT (DISPOSABLE) IMPLANT
SPONGE SURGIFOAM ABS GEL 100 (HEMOSTASIS) ×2 IMPLANT
STRIP CLOSURE SKIN 1/2X4 (GAUZE/BANDAGES/DRESSINGS) ×2 IMPLANT
SUT VIC AB 0 CT1 18XCR BRD8 (SUTURE) ×1 IMPLANT
SUT VIC AB 0 CT1 8-18 (SUTURE) ×1
SUT VIC AB 2-0 CP2 18 (SUTURE) ×2 IMPLANT
SUT VIC AB 3-0 SH 8-18 (SUTURE) ×4 IMPLANT
SYR 20ML ECCENTRIC (SYRINGE) ×2 IMPLANT
SYR 3ML LL SCALE MARK (SYRINGE) ×8 IMPLANT
TOWEL OR 17X24 6PK STRL BLUE (TOWEL DISPOSABLE) ×2 IMPLANT
TOWEL OR 17X26 10 PK STRL BLUE (TOWEL DISPOSABLE) ×2 IMPLANT
TRAY FOLEY CATH 14FRSI W/METER (CATHETERS) ×2 IMPLANT
WATER STERILE IRR 1000ML POUR (IV SOLUTION) ×2 IMPLANT

## 2013-07-17 NOTE — Anesthesia Procedure Notes (Signed)
Procedure Name: Intubation Date/Time: 07/17/2013 11:50 AM Performed by: Barrington Ellison Pre-anesthesia Checklist: Patient identified, Emergency Drugs available, Suction available, Patient being monitored and Timeout performed Patient Re-evaluated:Patient Re-evaluated prior to inductionOxygen Delivery Method: Circle system utilized Preoxygenation: Pre-oxygenation with 100% oxygen Intubation Type: IV induction and Rapid sequence Ventilation: Two handed mask ventilation required Laryngoscope Size: Mac and 4 Grade View: Grade I Tube type: Oral Tube size: 8.0 mm Number of attempts: 1 Airway Equipment and Method: Stylet Placement Confirmation: ETT inserted through vocal cords under direct vision,  positive ETCO2 and breath sounds checked- equal and bilateral Secured at: 23 cm Tube secured with: Tape Dental Injury: Teeth and Oropharynx as per pre-operative assessment

## 2013-07-17 NOTE — Op Note (Signed)
07/17/2013  3:03 PM  PATIENT:  Dennis [name]  68 y.o. male  PRE-OPERATIVE DIAGNOSIS:  Pseudoarthrosis L4-5 with recurrent stenosis and spondylolisthesis, back and leg pain  POST-OPERATIVE DIAGNOSIS:  Same  PROCEDURE:   1. Decompressive lumbar laminectomy L4-5 requiring more work than would be required for a simple exposure of the disk for PLIF in order to adequately decompress the neural elements and address the spinal stenosis 2. Posterior lumbar interbody fusion L4-5 using PEEK interbody cages packed with morcellized allograft and autograft 3. Posterior fixation L4-5 using cortical pedicle screws.  4. removal of interspinous plate L4-5 5. exploration of fusion L4-5 to assure pseudoarthrosis  SURGEON:  Sherley Bounds, MD  ASSISTANTS: dr Dominica Severin cram  ANESTHESIA:  General  EBL: 100 ml  Total I/O In: 1600 [I.V.:1600] Out: 500 [Urine:400; Blood:100]  BLOOD ADMINISTERED:none  DRAINS: Hemovac   INDICATION FOR PROCEDURE: This patient underwent a previous L4-5 fusion with interspinous plating. He presented with recurrent back and leg pain. CT myelogram showed a pseudoarthrosis at L4-5 with spondylolisthesis and recurrent stenosis. Recommended a repeat decompression and instrumented fusion he failed medical management. Patient understood the risks, benefits, and alternatives and potential outcomes and wished to proceed.  PROCEDURE DETAILS:  The patient was brought to the operating room. After induction of generalized endotracheal anesthesia the patient was rolled into the prone position on chest rolls and all pressure points were padded. The patient's lumbar region was cleaned and then prepped with DuraPrep and draped in the usual sterile fashion. Anesthesia was injected and then a dorsal midline incision was made and carried down to the lumbosacral fascia. The fascia was opened and the paraspinous musculature was taken down in a subperiosteal fashion to expose L4-5. A self-retaining retractor  was placed. The old interspinous plate was identified, loosened and removed. There was plenty of bone growth attached to the L4 lamina but it did not attach to the L5 lamina. There was motion at L4-5 confirming a pseudoarthrosis. I removed the extra overgrown bone. This helped me identify surface landmarks for placement of my screws. Intraoperative fluoroscopy confirmed my level, and I started with placement of the L4 cortical pedicle screws. The pedicle screw entry zones were identified utilizing surface landmarks and  AP and lateral fluoroscopy. I scored the cortex with the high-speed drill and then used the hand drill and EMG monitoring to drill an upward and outward direction into the pedicle. I then tapped line to line, and the tap was also monitored. I then placed a 5.5 by 35 mm cortical pedicle screw into the pedicles of L4 bilaterally. I then turned my attention to the decompression and the spinous process was removed and complete lumbar laminectomies, hemi- facetectomies, and foraminotomies were performed at L4-5. The patient had significant spinal stenosis and this required more work than would be required for a simple exposure of the disc for posterior lumbar interbody fusion. Much more generous decompression was undertaken in order to adequately decompress the neural elements and address the patient's leg pain. The yellow ligament was removed to expose the underlying dura and nerve roots, and generous foraminotomies were performed to adequately decompress the neural elements. Both the exiting and traversing nerve roots were decompressed on both sides until a coronary dilator passed easily along the nerve roots. Once the decompression was complete, I turned my attention to the posterior lower lumbar interbody fusion. The epidural venous vasculature was coagulated and cut sharply. Disc space was incised and the initial discectomy was performed with pituitary  rongeurs. The disc space was distracted with  sequential distractors to a height of 12 mm. We then used a series of scrapers and shavers to prepare the endplates for fusion. The midline was prepared with Epstein curettes. Once the complete discectomy was finished, we packed an appropriate sized peek interbody cage with local autograft and morcellized allograft, gently retracted the nerve root, and tapped the cage into position at L4-5.  The midline between the cages was packed with morselized autograft and allograft. We then turned our attention to the placement of the lower pedicle screws. The pedicle screw entry zones were identified utilizing surface landmarks and fluoroscopy. I drilled into each pedicle utilizing the hand drill and EMG monitoring, and tapped each pedicle with the appropriate tap. We palpated with a ball probe to assure no break in the cortex. We then placed 5.5 x 35 mm cortical pedicle screws into the pedicles bilaterally at L5. We then placed lordotic rods into the multiaxial screw heads of the pedicle screws and locked these in position with the locking caps and anti-torque device. We then checked our construct with AP and lateral fluoroscopy. Irrigated with copious amounts of bacitracin-containing saline solution. Placed a medium Hemovac drain through separate stab incision. Inspected the nerve roots once again to assure adequate decompression, lined to the dura with Gelfoam, and closed the muscle and the fascia with 0 Vicryl. Closed the subcutaneous tissues with 2-0 Vicryl and subcuticular tissues with 3-0 Vicryl. The skin was closed with benzoin and Steri-Strips. Dressing was then applied, the patient was awakened from general anesthesia and transported to the recovery room in stable condition. At the end of the procedure all sponge, needle and instrument counts were correct.   PLAN OF CARE: Admit to inpatient   PATIENT DISPOSITION:  PACU - hemodynamically stable.   Delay start of Pharmacological VTE agent (>24hrs) due to  surgical blood loss or risk of bleeding:  yes

## 2013-07-17 NOTE — Transfer of Care (Signed)
Immediate Anesthesia Transfer of Care Note  Patient: Dennis [name]  Procedure(s) Performed: Procedure(s): L/4-5 MAS PLIF, removal of affix plate (N/A)  Patient Location: PACU  Anesthesia Type:General  Level of Consciousness: patient cooperative, lethargic and responds to stimulation  Airway & Oxygen Therapy: Patient Spontanous Breathing and Patient connected to nasal cannula oxygen  Post-op Assessment: Report given to PACU RN  Post vital signs: Reviewed and stable  Complications: No apparent anesthesia complications

## 2013-07-17 NOTE — Anesthesia Postprocedure Evaluation (Signed)
  Anesthesia Post-op Note  Patient: Rodena Piety Earlywine Sr  Procedure(s) Performed: Procedure(s): L/4-5 MAS PLIF, removal of affix plate (N/A)  Patient Location: PACU  Anesthesia Type:General  Level of Consciousness: awake, alert , oriented and patient cooperative  Airway and Oxygen Therapy: Patient Spontanous Breathing  Post-op Pain: mild  Post-op Assessment: Post-op Vital signs reviewed, Patient's Cardiovascular Status Stable, Respiratory Function Stable, Patent Airway, No signs of Nausea or vomiting and Pain level controlled  Post-op Vital Signs: stable  Complications: No apparent anesthesia complications
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
## 2013-07-17 NOTE — Preoperative (Signed)
Beta Blockers   Reason not to administer Beta Blockers:Not Applicable 

## 2013-07-17 NOTE — Plan of Care (Signed)
Pt is transferred to the unit via wheelchair. Spouse at bedside. Pt is alert and oriented x 3. Pt is stable. Eating dinner sitting in chair.

## 2013-07-17 NOTE — Anesthesia Preprocedure Evaluation (Addendum)
Anesthesia Evaluation  Patient identified by MRN, date of birth, ID band Patient awake    Reviewed: Allergy & Precautions, H&P , NPO status , Patient's Chart, lab work & pertinent test results  History of Anesthesia Complications (+) PONV  Airway Mallampati: III TM Distance: >3 FB Neck ROM: Limited    Dental  (+) Teeth Intact and Dental Advisory Given   Pulmonary sleep apnea ,          Cardiovascular hypertension, + CAD and + Past MI     Neuro/Psych CVA    GI/Hepatic   Endo/Other  Hypothyroidism   Renal/GU      Musculoskeletal   Abdominal   Peds  Hematology   Anesthesia Other Findings   Reproductive/Obstetrics                          Anesthesia Physical Anesthesia Plan  ASA: III  Anesthesia Plan: General   Post-op Pain Management:    Induction: Intravenous  Airway Management Planned: Oral ETT  Additional Equipment:   Intra-op Plan:   Post-operative Plan: Extubation in OR  Informed Consent: I have reviewed the patients History and Physical, chart, labs and discussed the procedure including the risks, benefits and alternatives for the proposed anesthesia with the patient or authorized representative who has indicated his/her understanding and acceptance.     Plan Discussed with:   Anesthesia Plan Comments:         Anesthesia Quick Evaluation

## 2013-07-18 NOTE — Progress Notes (Signed)
Patient ID: Dennis [name], male   DOB: [birth year], 68 y.o.   MRN: [id number] Subjective: Patient reports he's doing great. No back or leg pain. Has walked 4 times. "This is great."  Objective: Vital signs in last 24 hours: Temp:  [96.8 F (36 C)-98.8 F (37.1 C)] 98.8 F (37.1 C) (01/08 0525) Pulse Rate:  [64-91] 74 (01/08 0525) Resp:  [7-25] 20 (01/08 0525) BP: (134-177)/(81-114) 134/81 mmHg (01/08 0525) SpO2:  [90 %-100 %] 94 % (01/08 0525) Weight:  [158.816 kg (350 lb 2 oz)] 158.816 kg (350 lb 2 oz) (01/07 1015)  Intake/Output from previous day: 01/07 0701 - 01/08 0700 In: 2703.8 [P.O.:240; I.V.:2463.8] Out: 2660 [Urine:2310; Drains:250; Blood:100] Intake/Output this shift:    Neurologic: Grossly normal  Lab Results: Lab Results  Component Value Date   WBC 5.3 07/17/2013   HGB 13.9 07/17/2013   HCT 40.2 07/17/2013   MCV 83.1 07/17/2013   PLT 142* 07/17/2013   Lab Results  Component Value Date   INR 0.97 07/17/2013   BMET Lab Results  Component Value Date   NA 140 07/17/2013   K 4.3 07/17/2013   CL 101 07/17/2013   CO2 28 07/17/2013   GLUCOSE 90 07/17/2013   BUN 17 07/17/2013   CREATININE 1.05 07/17/2013   CALCIUM 9.2 07/17/2013    Studies/Results: Dg Lumbar Spine 2-3 Views  07/17/2013   CLINICAL DATA:  Lumbar fusion surgery  EXAM: LUMBAR SPINE - 2-3 VIEW; DG C-ARM 1-60 MIN  COMPARISON:  CT 06/04/2013  FINDINGS: Two intraoperative fluoroscopic spot images. Bilateral pedicle screw placement at L4 and L5 with vertical interconnecting hardware. Graft markers project in the L4-5 interspace.  IMPRESSION: PLIF L4-5.   Electronically Signed   By: Arne Cleveland M.D.   On: 07/17/2013 15:20   Dg C-arm 1-60 Min  07/17/2013   CLINICAL DATA:  Lumbar fusion surgery  EXAM: LUMBAR SPINE - 2-3 VIEW; DG C-ARM 1-60 MIN  COMPARISON:  CT 06/04/2013  FINDINGS: Two intraoperative fluoroscopic spot images. Bilateral pedicle screw placement at L4 and L5 with vertical interconnecting hardware. Graft markers  project in the L4-5 interspace.  IMPRESSION: PLIF L4-5.   Electronically Signed   By: Arne Cleveland M.D.   On: 07/17/2013 15:20    Assessment/Plan: Doing great. No ride home today, so must stay until tomorrow.   LOS: 1 day    JONES,DAVID S 07/18/2013, 8:16 AM

## 2013-07-18 NOTE — Evaluation (Signed)
Physical Therapy Evaluation Patient Details Name: Dennis [name] MRN: [id number] DOB: [birth year] Today's Date: 07/18/2013 Time: 0938-1829 PT Time Calculation (min): 26 min  PT Assessment / Plan / Recommendation History of Present Illness  Patient is a 68 y.o. male admitted for PLIF L4-5  Clinical Impression  Patient independent, educated on precautions, no further acute PT needs, Will sign off.    PT Assessment  Patent does not need any further PT services    Follow Up Recommendations  No PT follow up          Equipment Recommendations  None recommended by PT          Precautions / Restrictions Precautions Precautions: Back Precaution Comments: Reviewed and reinforced spinal precautions Required Braces or Orthoses: Spinal Brace Spinal Brace: Lumbar corset Restrictions Weight Bearing Restrictions: No   Pertinent Vitals/Pain 0/10      Mobility  Bed Mobility Overal bed mobility: Modified Independent General bed mobility comments: increased time to perform Transfers Overall transfer level: Independent General transfer comment: also discussed simulated car transfer Ambulation/Gait Ambulation/Gait assistance: Independent Ambulation Distance (Feet): 820 Feet Assistive device: None Gait Pattern/deviations: WFL(Within Functional Limits) Stairs: Yes Stairs assistance: Modified independent (Device/Increase time) Stair Management: One rail Right;Alternating pattern;Forwards Number of Stairs: 14       PT Goals(Current goals can be found in the care plan section) Acute Rehab PT Goals PT Goal Formulation: No goals set, d/c therapy  Visit Information  Last PT Received On: 07/18/13 Assistance Needed: +1 History of Present Illness: Patient is a 68 y.o. male admitted for PLIF L4-5       Prior [place] expects to be discharged to:: Private residence Living Arrangements: Spouse/significant other;Children Available Help at Discharge:  Family Type of Home: House Home Access: Stairs to enter Technical brewer of Steps: 5 Entrance Stairs-Rails: Can reach both [place]: Two level;Able to live on main level with bedroom/bathroom Home Equipment: Gilford Rile - 2 wheels;Bedside commode;Hand held shower head;Adaptive equipment Prior Function Level of Independence: Independent Communication Communication: No difficulties Dominant Hand: Left    Cognition  Cognition Arousal/Alertness: Awake/alert Behavior During Therapy: WFL for tasks assessed/performed Overall Cognitive Status: Within Functional Limits for tasks assessed    Extremity/Trunk Assessment Upper Extremity Assessment Upper Extremity Assessment: Overall WFL for tasks assessed Lower Extremity Assessment Lower Extremity Assessment: Overall WFL for tasks assessed   Balance General Comments General comments (skin integrity, edema, etc.): bandage saturated with bloody discharge (nsg aware)  End of Session PT - End of Session Equipment Utilized During Treatment: Back brace Activity Tolerance: Patient tolerated treatment well Patient left: in chair;with call bell/phone within reach Nurse Communication: Mobility status  GP     Duncan Dull 07/18/2013, 9:04 AM Alben Deeds, [place] DPT  [phone]

## 2013-07-18 NOTE — Progress Notes (Signed)
Patient ambulated in the hallway times X 2 with RN. Tolerated very well. Denies any numbness or tingling. Hemovac drained 250 ml. Voiding good.  Vic Ripper, SCRN

## 2013-07-18 NOTE — Progress Notes (Signed)
   CARE MANAGEMENT NOTE 07/18/2013  Patient:  Kothari SR,Jyquan A   Account Number:  [id number]  Date Initiated:  07/18/2013  Documentation initiated by:  Olga Coaster  Subjective/Objective Assessment:   ADMITTED FOR SURGERY- LUMBAR LAMINECTOMY     Action/Plan:   CM FOLLOWING FOR DCP   Anticipated DC Date:  07/23/2013   Anticipated DC Plan:  AWAITING FOR PT/OT EVALS FOR DISPOSITION NEEDS WITH ATTENDING MD APPROVAL     DC Planning Services  CM consult          Status of service:  In process, will continue to follow Medicare Important Message given?  NA - LOS <3 / Initial given by admissions (If response is "NO", the following Medicare IM given date fields will be blank)  Per UR Regulation:  Reviewed for med. necessity/level of care/duration of stay  Comments:  1/8/2015Mindi Slicker RN,BSN,MHA [phone]

## 2013-07-18 NOTE — Evaluation (Signed)
Occupational Therapy Evaluation Patient Details Name: Dennis [name] MRN: [id number] DOB: June 13, 1946 Today's Date: 07/18/2013 Time: 7106-2694 OT Time Calculation (min): 22 min  OT Assessment / Plan / Recommendation History of present illness Patient is a 68 y.o. male admitted for PLIF L4-5   Clinical Impression   Pt is a 69 y/o male s/p PLIF L4-5, he has a/e, wide 3:1. He was educated in back precautions and recommendations for performance of ADL's to assist in adherence w/ precautions. He states no further acute OT needs at this time as he will have PRN family PRN assist at d/c.    OT Assessment  Patient does not need any further OT services    Follow Up Recommendations  No OT follow up;Supervision - Intermittent    Barriers to Discharge      Equipment Recommendations  None recommended by OT;Other (comment) (Pt states that he has wide 3:1, long handled reacher and toilet aide, built in shower seat w/ walk in shower)    Recommendations for Other Services    Frequency       Precautions / Restrictions Precautions Precautions: Back Precaution Comments: Reviewed and reinforced spinal precautions Required Braces or Orthoses: Spinal Brace Spinal Brace: Lumbar corset Restrictions Weight Bearing Restrictions: No   Pertinent Vitals/Pain No c/o    ADL  Eating/Feeding: Performed;Independent Where Assessed - Eating/Feeding: Chair Grooming: Modified independent;Simulated Where Assessed - Grooming: Supported sitting;Supported standing Upper Body Bathing: Simulated;Set up;Supervision/safety Where Assessed - Upper Body Bathing: Supported sitting;Unsupported sitting Lower Body Bathing: Simulated;Supervision/safety;Min guard Where Assessed - Lower Body Bathing: Supported sit to stand Upper Body Dressing: Performed;Modified independent Where Assessed - Upper Body Dressing: Unsupported sitting;Unsupported standing Lower Body Dressing: Performed;Supervision/safety (Pt crossed one leg over  opposite knee to don/doff socks) Where Assessed - Lower Body Dressing: Supported sit to stand;Unsupported sitting (Pt also has LH reacher at home & family assist) Toilet Transfer: Performed;Modified independent;Supervision/safety Toilet Transfer Method: Sit to Loss adjuster, chartered: Comfort height toilet Toileting - Clothing Manipulation and Hygiene: Performed;Modified independent Where Assessed - Toileting Clothing Manipulation and Hygiene: Sit on 3-in-1 or toilet;Sit to stand from 3-in-1 or toilet Tub/Shower Transfer: Simulated;Modified independent (Pt Mod I stepping up over towel roll for simulated shower transfer x2) Tub/Shower Transfer Method: Ambulating Tub/Shower Transfer Equipment: Walk in shower;Other (comment) (Pt has built in seat at home, doesn't generally use, prefers to stand) Equipment Used: Back brace Transfers/Ambulation Related to ADLs: Pt overall supervision-Mod I functional transfers. Bed mobility not tested as pt was up in chair upon OT arrival. ADL Comments: Pt was educated in role of OT, he reports that he has toilet aide, wide 3:1, long handled reacher at home since last surgery. Pt participated in ADL retraining session for toileting, LB dressing and simulated shower transfer today. Pt will have supervision and PRN assist at home, but is noted to be Mod I w/ use of a/e as stated. Reviewed back precautions during functional activity such as reaching into cabinets & flushing toilets, etc. Pt cautioned to avoid overhead activities and get assist PRN for ADL's. Pt reported that he is I don/doffing back brace since last surgery and declined  performance of this. Pt reports no further acute OT needs, will sign off at this time.    OT Diagnosis:    OT Problem List:   OT Treatment Interventions:     OT Goals(Current goals can be found in the care plan section) Acute Rehab OT Goals Patient Stated Goal: D/c home w/ family (spouse/son) PRN assist  Visit Information   Last OT Received On: 07/18/13 Assistance Needed: +1 History of Present Illness: Patient is a 68 y.o. male admitted for PLIF L4-5       Prior [place] expects to be discharged to:: Private residence Living Arrangements: Spouse/significant other;Children Available Help at Discharge: Family Type of Home: House Home Access: Stairs to enter Technical brewer of Steps: 4-5 Entrance Stairs-Rails: Can reach both Door: Two level;Able to live on main level with bedroom/bathroom Home Equipment: Gilford Rile - 2 wheels;Bedside commode;Hand held shower head;Adaptive equipment;Shower seat - built in [employer]: Reacher Prior Function Level of Independence: Independent Communication Communication: No difficulties Dominant Hand: Left    Vision/Perception Vision - History Baseline Vision: Wears glasses all the time   Cognition  Cognition Arousal/Alertness: Awake/alert Behavior During Therapy: WFL for tasks assessed/performed Overall Cognitive Status: Within Functional Limits for tasks assessed    Extremity/Trunk Assessment Upper Extremity Assessment Upper Extremity Assessment: Overall WFL for tasks assessed Lower Extremity Assessment Lower Extremity Assessment: Overall WFL for tasks assessed    Mobility Bed Mobility Overal bed mobility: Modified Independent General bed mobility comments: increased time to perform Transfers Overall transfer level: Independent Equipment used: None General transfer comment: also discussed simulated car transfer        Balance Balance Overall balance assessment: No apparent balance deficits (not formally assessed) General Comments General comments (skin integrity, edema, etc.): bandage saturated with bloody discharge (nsg aware)   End of Session OT - End of Session Equipment Utilized During Treatment: Back brace Activity Tolerance: Patient tolerated treatment well Patient left: in chair;with call  bell/phone within reach  [place], [place] 07/18/2013, 9:40 AM

## 2013-07-19 MED ORDER — OXYCODONE-ACETAMINOPHEN 5-325 MG PO TABS: 1.0000 | ORAL_TABLET | ORAL | Status: DC | PRN

## 2013-07-19 NOTE — Discharge Summary (Signed)
Physician Discharge Summary  Patient ID: Dennis Piety Hughett Sr. MRN: [id number] DOB/AGE: 09/12/1945 68 y.o.  Admit date: 07/17/2013 Discharge date: 07/19/2013  Admission Diagnoses: pseudodarthrosis L4-5, with stenosis and instability   Discharge Diagnoses: same   Discharged Condition: good  Hospital Course: The patient was admitted on 07/17/2013 and taken to the operating room where the patient underwent PLIF L4-5. The patient tolerated the procedure well and was taken to the recovery room and then to the floor in stable condition. The hospital course was routine. There were no complications. The wound remained clean dry and intact. Pt had appropriate back soreness. No complaints of leg pain or new N/T/W. The patient remained afebrile with stable vital signs, and tolerated a regular diet. The patient continued to increase activities, and pain was well controlled with oral pain medications.   Consults: None  Significant Diagnostic Studies:  Results for orders placed during the hospital encounter of 07/17/13  SURGICAL PCR SCREEN      Result Value Range   MRSA, PCR NEGATIVE  NEGATIVE   Staphylococcus aureus NEGATIVE  NEGATIVE  BASIC METABOLIC PANEL      Result Value Range   Sodium 140  137 - 147 mEq/L   Potassium 4.3  3.7 - 5.3 mEq/L   Chloride 101  96 - 112 mEq/L   CO2 28  19 - 32 mEq/L   Glucose, Bld 90  70 - 99 mg/dL   BUN 17  6 - 23 mg/dL   Creatinine, Ser 1.05  0.50 - 1.35 mg/dL   Calcium 9.2  8.4 - 10.5 mg/dL   GFR calc non Af Amer 71 (*) >90 mL/min   GFR calc Af Amer 83 (*) >90 mL/min  CBC WITH DIFFERENTIAL      Result Value Range   WBC 5.3  4.0 - 10.5 K/uL   RBC 4.84  4.22 - 5.81 MIL/uL   Hemoglobin 13.9  13.0 - 17.0 g/dL   HCT 40.2  39.0 - 52.0 %   MCV 83.1  78.0 - 100.0 fL   MCH 28.7  26.0 - 34.0 pg   MCHC 34.6  30.0 - 36.0 g/dL   RDW 13.6  11.5 - 15.5 %   Platelets 142 (*) 150 - 400 K/uL   Neutrophils Relative % 70  43 - 77 %   Neutro Abs 3.7  1.7 - 7.7 K/uL   Lymphocytes Relative 21  12 - 46 %   Lymphs Abs 1.1  0.7 - 4.0 K/uL   Monocytes Relative 6  3 - 12 %   Monocytes Absolute 0.3  0.1 - 1.0 K/uL   Eosinophils Relative 3  0 - 5 %   Eosinophils Absolute 0.1  0.0 - 0.7 K/uL   Basophils Relative 0  0 - 1 %   Basophils Absolute 0.0  0.0 - 0.1 K/uL  PROTIME-INR      Result Value Range   Prothrombin Time 12.7  11.6 - 15.2 seconds   INR 0.97  0.00 - 1.49  TYPE AND SCREEN      Result Value Range   ABO/RH(D) O POS     Antibody Screen NEG     Sample Expiration 07/20/2013      Dg Lumbar Spine 2-3 Views  07/17/2013   CLINICAL DATA:  Lumbar fusion surgery  EXAM: LUMBAR SPINE - 2-3 VIEW; DG C-ARM 1-60 MIN  COMPARISON:  CT 06/04/2013  FINDINGS: Two intraoperative fluoroscopic spot images. Bilateral pedicle screw placement at L4 and L5 with vertical interconnecting  hardware. Graft markers project in the L4-5 interspace.  IMPRESSION: PLIF L4-5.   Electronically Signed   By: Arne Cleveland M.D.   On: 07/17/2013 15:20   Dg C-arm 1-60 Min  07/17/2013   CLINICAL DATA:  Lumbar fusion surgery  EXAM: LUMBAR SPINE - 2-3 VIEW; DG C-ARM 1-60 MIN  COMPARISON:  CT 06/04/2013  FINDINGS: Two intraoperative fluoroscopic spot images. Bilateral pedicle screw placement at L4 and L5 with vertical interconnecting hardware. Graft markers project in the L4-5 interspace.  IMPRESSION: PLIF L4-5.   Electronically Signed   By: Arne Cleveland M.D.   On: 07/17/2013 15:20    Antibiotics:  Anti-infectives   Start     Dose/Rate Route Frequency Ordered Stop   07/17/13 1730  ceFAZolin (ANCEF) IVPB 1 g/50 mL premix     1 g 100 mL/hr over 30 Minutes Intravenous Every 8 hours 07/17/13 1720 07/18/13 0148   07/17/13 1238  bacitracin 50,000 Units in sodium chloride irrigation 0.9 % 500 mL irrigation  Status:  Discontinued       As needed 07/17/13 1238 07/17/13 1502   07/17/13 0600  ceFAZolin (ANCEF) 3 g in dextrose 5 % 50 mL IVPB     3 g 160 mL/hr over 30 Minutes Intravenous On call to  O.R. 07/16/13 1416 07/17/13 1200      Discharge Exam: Blood pressure 152/75, pulse 73, temperature 98.4 F (36.9 C), temperature source Oral, resp. rate 20, height 6\' 7"  (2.007 m), weight 158.816 kg (350 lb 2 oz), SpO2 96.00%. Neurologic: Grossly normal Incision CDI  Discharge Medications:     Medication List         allopurinol 100 MG tablet  Commonly known as:  ZYLOPRIM  Take 100 mg by mouth 2 (two) times daily.     atorvastatin 20 MG tablet  Commonly known as:  LIPITOR  Take 20 mg by mouth every evening.     carvedilol 6.25 MG tablet  Commonly known as:  COREG  Take 6.25 mg by mouth 2 (two) times daily with a meal.     EPIPEN 0.3 mg/0.3 mL Soaj injection  Generic drug:  EPINEPHrine  Inject 0.3 mg into the muscle as needed (allergic reaction).     escitalopram 20 MG tablet  Commonly known as:  LEXAPRO  Take 20 mg by mouth every morning.     furosemide 20 MG tablet  Commonly known as:  LASIX  Take 20 mg by mouth 2 (two) times daily.     levothyroxine 200 MCG tablet  Commonly known as:  SYNTHROID, LEVOTHROID  Take 200 mcg by mouth daily before breakfast.     metolazone 2.5 MG tablet  Commonly known as:  ZAROXOLYN  Take 2.5 mg by mouth daily.     oxyCODONE-acetaminophen 5-325 MG per tablet  Commonly known as:  PERCOCET/ROXICET  Take 1-2 tablets by mouth every 4 (four) hours as needed for moderate pain.     phenazopyridine 200 MG tablet  Commonly known as:  PYRIDIUM  Take 200 mg by mouth 3 (three) times daily as needed for pain.     PRESCRIPTION MEDICATION  - Apply 1 application topically daily as needed. Topical powder.   - Apply to affected area as needed for rash.        Disposition: home   Final Dx: PLIF L4-5      Discharge Orders   Future Appointments Provider Department Dept Phone   07/23/2013 11:00 AM [facility] [place]  SUR[facility]ERY [phone]   Future Orders Complete By Expires   Call MD for:  difficulty  breathing, headache or visual disturbances  As directed    Call MD for:  persistant nausea and vomiting  As directed    Call MD for:  redness, tenderness, or signs of infection (pain, swelling, redness, odor or green/yellow discharge around incision site)  As directed    Call MD for:  severe uncontrolled pain  As directed    Call MD for:  temperature >100.4  As directed    Diet - low sodium heart healthy  As directed    Discharge instructions  As directed    Comments:     No driving or strenuous activity, no heavy lifting, may shower normally   Increase activity slowly  As directed       Follow-up Information   Follow up with JONES,DAVID S, MD. Schedule an appointment as soon as possible for a visit in 2 weeks.   Specialty:  Neurosurgery   Contact information:   [address] [place] [address] [phone]        Signed: Eustace Moore 07/19/2013, 7:38 AM

## 2013-07-23 ENCOUNTER — Encounter (HOSPITAL_COMMUNITY): Payer: Self-pay | Admitting: Neurological Surgery

## 2013-07-23 ENCOUNTER — Other Ambulatory Visit (HOSPITAL_COMMUNITY): Payer: Medicare Other

## 2013-08-06 DIAGNOSIS — R82998 Other abnormal findings in urine: Secondary | ICD-10-CM | POA: Diagnosis not present

## 2013-08-06 DIAGNOSIS — N3000 Acute cystitis without hematuria: Secondary | ICD-10-CM | POA: Diagnosis not present

## 2013-08-19 DIAGNOSIS — E669 Obesity, unspecified: Secondary | ICD-10-CM | POA: Diagnosis not present

## 2013-08-19 DIAGNOSIS — M549 Dorsalgia, unspecified: Secondary | ICD-10-CM | POA: Diagnosis not present

## 2013-08-21 DIAGNOSIS — J069 Acute upper respiratory infection, unspecified: Secondary | ICD-10-CM | POA: Diagnosis not present

## 2013-08-21 DIAGNOSIS — G4733 Obstructive sleep apnea (adult) (pediatric): Secondary | ICD-10-CM | POA: Diagnosis not present

## 2013-08-21 DIAGNOSIS — I10 Essential (primary) hypertension: Secondary | ICD-10-CM | POA: Diagnosis not present

## 2013-09-17 DIAGNOSIS — N320 Bladder-neck obstruction: Secondary | ICD-10-CM | POA: Diagnosis not present

## 2013-09-17 DIAGNOSIS — N35919 Unspecified urethral stricture, male, unspecified site: Secondary | ICD-10-CM | POA: Diagnosis not present

## 2013-09-23 DIAGNOSIS — N320 Bladder-neck obstruction: Secondary | ICD-10-CM | POA: Diagnosis not present

## 2013-09-23 DIAGNOSIS — N529 Male erectile dysfunction, unspecified: Secondary | ICD-10-CM | POA: Diagnosis not present

## 2013-09-23 DIAGNOSIS — N3946 Mixed incontinence: Secondary | ICD-10-CM | POA: Diagnosis not present

## 2013-09-23 DIAGNOSIS — R339 Retention of urine, unspecified: Secondary | ICD-10-CM | POA: Diagnosis not present

## 2013-10-21 DIAGNOSIS — Z6841 Body Mass Index (BMI) 40.0 and over, adult: Secondary | ICD-10-CM | POA: Diagnosis not present

## 2013-10-21 DIAGNOSIS — M545 Low back pain, unspecified: Secondary | ICD-10-CM | POA: Diagnosis not present

## 2013-10-21 DIAGNOSIS — M549 Dorsalgia, unspecified: Secondary | ICD-10-CM | POA: Diagnosis not present

## 2013-11-11 ENCOUNTER — Ambulatory Visit: Payer: Self-pay | Admitting: Family Medicine

## 2013-11-11 DIAGNOSIS — L0390 Cellulitis, unspecified: Secondary | ICD-10-CM | POA: Diagnosis not present

## 2013-11-11 DIAGNOSIS — M109 Gout, unspecified: Secondary | ICD-10-CM | POA: Diagnosis not present

## 2013-11-11 DIAGNOSIS — M7989 Other specified soft tissue disorders: Secondary | ICD-10-CM | POA: Diagnosis not present

## 2013-11-11 DIAGNOSIS — L0291 Cutaneous abscess, unspecified: Secondary | ICD-10-CM | POA: Diagnosis not present

## 2013-11-11 DIAGNOSIS — I2510 Atherosclerotic heart disease of native coronary artery without angina pectoris: Secondary | ICD-10-CM | POA: Diagnosis not present

## 2013-11-11 DIAGNOSIS — Z79899 Other long term (current) drug therapy: Secondary | ICD-10-CM | POA: Diagnosis not present

## 2013-11-11 DIAGNOSIS — M79609 Pain in unspecified limb: Secondary | ICD-10-CM | POA: Diagnosis not present

## 2013-11-13 ENCOUNTER — Ambulatory Visit: Payer: Self-pay | Admitting: Family Medicine

## 2013-11-13 DIAGNOSIS — M109 Gout, unspecified: Secondary | ICD-10-CM | POA: Diagnosis not present

## 2013-11-13 DIAGNOSIS — M7989 Other specified soft tissue disorders: Secondary | ICD-10-CM | POA: Diagnosis not present

## 2013-11-13 DIAGNOSIS — Z79899 Other long term (current) drug therapy: Secondary | ICD-10-CM | POA: Diagnosis not present

## 2013-11-13 DIAGNOSIS — M79609 Pain in unspecified limb: Secondary | ICD-10-CM | POA: Diagnosis not present

## 2013-11-13 DIAGNOSIS — E669 Obesity, unspecified: Secondary | ICD-10-CM | POA: Diagnosis not present

## 2013-11-13 DIAGNOSIS — L0291 Cutaneous abscess, unspecified: Secondary | ICD-10-CM | POA: Diagnosis not present

## 2013-11-13 DIAGNOSIS — L0390 Cellulitis, unspecified: Secondary | ICD-10-CM | POA: Diagnosis not present

## 2013-11-27 DIAGNOSIS — Z1212 Encounter for screening for malignant neoplasm of rectum: Secondary | ICD-10-CM | POA: Diagnosis not present

## 2013-11-27 DIAGNOSIS — C000 Malignant neoplasm of external upper lip: Secondary | ICD-10-CM | POA: Diagnosis not present

## 2013-11-27 DIAGNOSIS — E669 Obesity, unspecified: Secondary | ICD-10-CM | POA: Diagnosis not present

## 2013-11-27 DIAGNOSIS — K625 Hemorrhage of anus and rectum: Secondary | ICD-10-CM | POA: Diagnosis not present

## 2013-11-27 DIAGNOSIS — K5900 Constipation, unspecified: Secondary | ICD-10-CM | POA: Diagnosis not present

## 2013-12-03 DIAGNOSIS — K625 Hemorrhage of anus and rectum: Secondary | ICD-10-CM | POA: Insufficient documentation

## 2013-12-03 DIAGNOSIS — K602 Anal fissure, unspecified: Secondary | ICD-10-CM | POA: Diagnosis not present

## 2013-12-23 DIAGNOSIS — F329 Major depressive disorder, single episode, unspecified: Secondary | ICD-10-CM | POA: Diagnosis not present

## 2013-12-23 DIAGNOSIS — R5381 Other malaise: Secondary | ICD-10-CM | POA: Diagnosis not present

## 2013-12-23 DIAGNOSIS — R079 Chest pain, unspecified: Secondary | ICD-10-CM | POA: Diagnosis not present

## 2013-12-23 DIAGNOSIS — Z6841 Body Mass Index (BMI) 40.0 and over, adult: Secondary | ICD-10-CM | POA: Diagnosis not present

## 2013-12-23 DIAGNOSIS — I252 Old myocardial infarction: Secondary | ICD-10-CM | POA: Diagnosis not present

## 2013-12-23 DIAGNOSIS — E876 Hypokalemia: Secondary | ICD-10-CM | POA: Diagnosis not present

## 2013-12-23 DIAGNOSIS — E039 Hypothyroidism, unspecified: Secondary | ICD-10-CM | POA: Diagnosis not present

## 2013-12-23 DIAGNOSIS — Z79899 Other long term (current) drug therapy: Secondary | ICD-10-CM | POA: Diagnosis not present

## 2013-12-23 DIAGNOSIS — N390 Urinary tract infection, site not specified: Secondary | ICD-10-CM | POA: Diagnosis not present

## 2013-12-23 DIAGNOSIS — B961 Klebsiella pneumoniae [K. pneumoniae] as the cause of diseases classified elsewhere: Secondary | ICD-10-CM | POA: Diagnosis not present

## 2013-12-23 DIAGNOSIS — M109 Gout, unspecified: Secondary | ICD-10-CM | POA: Diagnosis not present

## 2013-12-23 DIAGNOSIS — Z8673 Personal history of transient ischemic attack (TIA), and cerebral infarction without residual deficits: Secondary | ICD-10-CM | POA: Diagnosis not present

## 2013-12-23 DIAGNOSIS — F3289 Other specified depressive episodes: Secondary | ICD-10-CM | POA: Diagnosis not present

## 2013-12-23 DIAGNOSIS — G4733 Obstructive sleep apnea (adult) (pediatric): Secondary | ICD-10-CM | POA: Diagnosis not present

## 2013-12-23 DIAGNOSIS — I2510 Atherosclerotic heart disease of native coronary artery without angina pectoris: Secondary | ICD-10-CM | POA: Diagnosis not present

## 2013-12-23 DIAGNOSIS — M6281 Muscle weakness (generalized): Secondary | ICD-10-CM | POA: Diagnosis not present

## 2013-12-23 DIAGNOSIS — R404 Transient alteration of awareness: Secondary | ICD-10-CM | POA: Diagnosis not present

## 2013-12-23 DIAGNOSIS — R52 Pain, unspecified: Secondary | ICD-10-CM | POA: Diagnosis not present

## 2013-12-23 DIAGNOSIS — R5383 Other fatigue: Secondary | ICD-10-CM | POA: Diagnosis not present

## 2013-12-23 DIAGNOSIS — I10 Essential (primary) hypertension: Secondary | ICD-10-CM | POA: Diagnosis not present

## 2013-12-23 DIAGNOSIS — R609 Edema, unspecified: Secondary | ICD-10-CM | POA: Diagnosis not present

## 2013-12-23 DIAGNOSIS — R072 Precordial pain: Secondary | ICD-10-CM | POA: Diagnosis not present

## 2013-12-23 DIAGNOSIS — R0602 Shortness of breath: Secondary | ICD-10-CM | POA: Diagnosis not present

## 2013-12-23 DIAGNOSIS — Z96659 Presence of unspecified artificial knee joint: Secondary | ICD-10-CM | POA: Diagnosis not present

## 2013-12-23 DIAGNOSIS — Z9889 Other specified postprocedural states: Secondary | ICD-10-CM | POA: Diagnosis not present

## 2013-12-24 DIAGNOSIS — N390 Urinary tract infection, site not specified: Secondary | ICD-10-CM | POA: Diagnosis not present

## 2013-12-24 DIAGNOSIS — R079 Chest pain, unspecified: Secondary | ICD-10-CM | POA: Diagnosis not present

## 2013-12-24 DIAGNOSIS — I2510 Atherosclerotic heart disease of native coronary artery without angina pectoris: Secondary | ICD-10-CM | POA: Diagnosis not present

## 2013-12-24 DIAGNOSIS — I10 Essential (primary) hypertension: Secondary | ICD-10-CM | POA: Diagnosis not present

## 2013-12-25 DIAGNOSIS — I2510 Atherosclerotic heart disease of native coronary artery without angina pectoris: Secondary | ICD-10-CM | POA: Diagnosis not present

## 2013-12-25 DIAGNOSIS — R079 Chest pain, unspecified: Secondary | ICD-10-CM | POA: Diagnosis not present

## 2013-12-25 DIAGNOSIS — N390 Urinary tract infection, site not specified: Secondary | ICD-10-CM | POA: Diagnosis not present

## 2013-12-25 DIAGNOSIS — I10 Essential (primary) hypertension: Secondary | ICD-10-CM | POA: Diagnosis not present

## 2013-12-26 DIAGNOSIS — R079 Chest pain, unspecified: Secondary | ICD-10-CM | POA: Diagnosis not present

## 2013-12-26 DIAGNOSIS — I10 Essential (primary) hypertension: Secondary | ICD-10-CM | POA: Diagnosis not present

## 2013-12-26 DIAGNOSIS — I2510 Atherosclerotic heart disease of native coronary artery without angina pectoris: Secondary | ICD-10-CM | POA: Diagnosis not present

## 2013-12-26 DIAGNOSIS — N390 Urinary tract infection, site not specified: Secondary | ICD-10-CM | POA: Diagnosis not present

## 2013-12-29 ENCOUNTER — Encounter (HOSPITAL_COMMUNITY): Payer: Self-pay | Admitting: Emergency Medicine

## 2013-12-29 ENCOUNTER — Emergency Department (HOSPITAL_COMMUNITY): Payer: Medicare Other

## 2013-12-29 ENCOUNTER — Emergency Department (HOSPITAL_COMMUNITY)
Admission: EM | Admit: 2013-12-29 | Discharge: 2013-12-29 | Disposition: A | Discharge status: Home / Self Care | Payer: Medicare Other | Attending: Emergency Medicine | Admitting: Emergency Medicine

## 2013-12-29 DIAGNOSIS — Z79899 Other long term (current) drug therapy: Secondary | ICD-10-CM | POA: Diagnosis not present

## 2013-12-29 DIAGNOSIS — G4733 Obstructive sleep apnea (adult) (pediatric): Secondary | ICD-10-CM | POA: Diagnosis not present

## 2013-12-29 DIAGNOSIS — Z8701 Personal history of pneumonia (recurrent): Secondary | ICD-10-CM | POA: Insufficient documentation

## 2013-12-29 DIAGNOSIS — M1610 Unilateral primary osteoarthritis, unspecified hip: Secondary | ICD-10-CM | POA: Diagnosis not present

## 2013-12-29 DIAGNOSIS — R5381 Other malaise: Secondary | ICD-10-CM | POA: Diagnosis not present

## 2013-12-29 DIAGNOSIS — Z792 Long term (current) use of antibiotics: Secondary | ICD-10-CM | POA: Diagnosis not present

## 2013-12-29 DIAGNOSIS — R531 Weakness: Secondary | ICD-10-CM

## 2013-12-29 DIAGNOSIS — I252 Old myocardial infarction: Secondary | ICD-10-CM | POA: Insufficient documentation

## 2013-12-29 DIAGNOSIS — M109 Gout, unspecified: Secondary | ICD-10-CM | POA: Diagnosis not present

## 2013-12-29 DIAGNOSIS — J329 Chronic sinusitis, unspecified: Secondary | ICD-10-CM | POA: Insufficient documentation

## 2013-12-29 DIAGNOSIS — I10 Essential (primary) hypertension: Secondary | ICD-10-CM | POA: Diagnosis not present

## 2013-12-29 DIAGNOSIS — E039 Hypothyroidism, unspecified: Secondary | ICD-10-CM | POA: Diagnosis not present

## 2013-12-29 DIAGNOSIS — E786 Lipoprotein deficiency: Secondary | ICD-10-CM | POA: Diagnosis not present

## 2013-12-29 DIAGNOSIS — R5383 Other fatigue: Principal | ICD-10-CM

## 2013-12-29 DIAGNOSIS — Z9889 Other specified postprocedural states: Secondary | ICD-10-CM | POA: Insufficient documentation

## 2013-12-29 DIAGNOSIS — J019 Acute sinusitis, unspecified: Secondary | ICD-10-CM

## 2013-12-29 DIAGNOSIS — R404 Transient alteration of awareness: Secondary | ICD-10-CM | POA: Diagnosis not present

## 2013-12-29 DIAGNOSIS — R51 Headache: Secondary | ICD-10-CM | POA: Diagnosis not present

## 2013-12-29 DIAGNOSIS — G8929 Other chronic pain: Secondary | ICD-10-CM | POA: Insufficient documentation

## 2013-12-29 DIAGNOSIS — I2510 Atherosclerotic heart disease of native coronary artery without angina pectoris: Secondary | ICD-10-CM | POA: Diagnosis not present

## 2013-12-29 DIAGNOSIS — N390 Urinary tract infection, site not specified: Secondary | ICD-10-CM | POA: Diagnosis not present

## 2013-12-29 HISTORY — DX: Spotted fever due to Rickettsia rickettsii: A77.0

## 2013-12-29 LAB — CBC WITH DIFFERENTIAL/PLATELET
Basophils Absolute: 0 10*3/uL (ref 0.0–0.1)
Basophils Relative: 0 % (ref 0–1)
Eosinophils Absolute: 0.2 10*3/uL (ref 0.0–0.7)
Eosinophils Relative: 2 % (ref 0–5)
HCT: 39.2 % (ref 39.0–52.0)
Hemoglobin: 13.1 g/dL (ref 13.0–17.0)
Lymphocytes Relative: 11 % — ABNORMAL LOW (ref 12–46)
Lymphs Abs: 0.9 10*3/uL (ref 0.7–4.0)
MCH: 27.5 pg (ref 26.0–34.0)
MCHC: 33.4 g/dL (ref 30.0–36.0)
MCV: 82.4 fL (ref 78.0–100.0)
Monocytes Absolute: 0.5 10*3/uL (ref 0.1–1.0)
Monocytes Relative: 6 % (ref 3–12)
Neutro Abs: 6.8 10*3/uL (ref 1.7–7.7)
Neutrophils Relative %: 81 % — ABNORMAL HIGH (ref 43–77)
Platelets: 143 10*3/uL — ABNORMAL LOW (ref 150–400)
RBC: 4.76 MIL/uL (ref 4.22–5.81)
RDW: 14.1 % (ref 11.5–15.5)
WBC: 8.4 10*3/uL (ref 4.0–10.5)

## 2013-12-29 LAB — URINALYSIS, ROUTINE W REFLEX MICROSCOPIC
Bilirubin Urine: NEGATIVE
Glucose, UA: NEGATIVE mg/dL
Hgb urine dipstick: NEGATIVE
Ketones, ur: NEGATIVE mg/dL
Leukocytes, UA: NEGATIVE
Nitrite: NEGATIVE
Protein, ur: NEGATIVE mg/dL
Specific Gravity, Urine: 1.01 (ref 1.005–1.030)
Urobilinogen, UA: 1 mg/dL (ref 0.0–1.0)
pH: 6.5 (ref 5.0–8.0)

## 2013-12-29 LAB — COMPREHENSIVE METABOLIC PANEL
ALT: 13 U/L (ref 0–53)
AST: 15 U/L (ref 0–37)
Albumin: 3.9 g/dL (ref 3.5–5.2)
Alkaline Phosphatase: 62 U/L (ref 39–117)
BUN: 12 mg/dL (ref 6–23)
CO2: 28 mEq/L (ref 19–32)
Calcium: 9.7 mg/dL (ref 8.4–10.5)
Chloride: 96 mEq/L (ref 96–112)
Creatinine, Ser: 1.13 mg/dL (ref 0.50–1.35)
GFR calc Af Amer: 75 mL/min — ABNORMAL LOW (ref >90)
GFR calc non Af Amer: 65 mL/min — ABNORMAL LOW (ref >90)
Glucose, Bld: 102 mg/dL — ABNORMAL HIGH (ref 70–99)
Potassium: 4.3 mEq/L (ref 3.7–5.3)
Sodium: 136 mEq/L — ABNORMAL LOW (ref 137–147)
Total Bilirubin: 1 mg/dL (ref 0.3–1.2)
Total Protein: 7.6 g/dL (ref 6.0–8.3)

## 2013-12-29 LAB — I-STAT CHEM 8, ED
BUN: 10 mg/dL (ref 6–23)
Calcium, Ion: 1.2 mmol/L (ref 1.13–1.30)
Chloride: 94 mEq/L — ABNORMAL LOW (ref 96–112)
Creatinine, Ser: 1.2 mg/dL (ref 0.50–1.35)
Glucose, Bld: 100 mg/dL — ABNORMAL HIGH (ref 70–99)
HCT: 42 % (ref 39.0–52.0)
Hemoglobin: 14.3 g/dL (ref 13.0–17.0)
Potassium: 3.9 mEq/L (ref 3.7–5.3)
Sodium: 136 mEq/L — ABNORMAL LOW (ref 137–147)
TCO2: 28 mmol/L (ref 0–100)

## 2013-12-29 LAB — CBG MONITORING, ED: Glucose-Capillary: 97 mg/dL (ref 70–99)

## 2013-12-29 MED ORDER — ONDANSETRON HCL 4 MG/2ML IJ SOLN
4.0000 mg | Freq: Once | INTRAMUSCULAR | Status: AC
  Administered 2013-12-29: 4 mg via INTRAVENOUS
  Filled 2013-12-29: qty 2

## 2013-12-29 MED ORDER — KETOROLAC TROMETHAMINE 30 MG/ML IJ SOLN
30.0000 mg | Freq: Once | INTRAMUSCULAR | Status: AC
  Administered 2013-12-29: 30 mg via INTRAVENOUS
  Filled 2013-12-29: qty 1

## 2013-12-29 MED ORDER — LEVOFLOXACIN 500 MG PO TABS: 500.0000 mg | ORAL_TABLET | Freq: Every day | ORAL | Status: DC

## 2013-12-29 NOTE — ED Notes (Signed)
Started a week ago taken to [facility]. BP was elevated. Found he had a UTI. Fever of 102.  Been taking antibiotics all week.

## 2013-12-29 NOTE — Discharge Instructions (Signed)
Sinusitis Sinusitis is redness, soreness, and puffiness (inflammation) of the air pockets in the bones of your face (sinuses). The redness, soreness, and puffiness can cause air and mucus to get trapped in your sinuses. This can allow germs to grow and cause an infection.  HOME CARE   Drink enough fluids to keep your pee (urine) clear or pale yellow.  Use a humidifier in your home.  Run a hot shower to create steam in the bathroom. Sit in the bathroom with the door closed. Breathe in the steam 3-4 times a day.  Put a warm, moist washcloth on your face 3-4 times a day, or as told by your doctor.  Use salt water sprays (saline sprays) to wet the thick fluid in your nose. This can help the sinuses drain.  Only take medicine as told by your doctor. GET HELP RIGHT AWAY IF:   Your pain gets worse.  You have very bad headaches.  You are sick to your stomach (nauseous).  You throw up (vomit).  You are very sleepy (drowsy) all the time.  Your face is puffy (swollen).  Your vision changes.  You have a stiff neck.  You have trouble breathing. MAKE SURE YOU:   Understand these instructions.  Will watch your condition.  Will get help right away if you are not doing well or get worse. Document Released: 12/14/2007 Document Revised: 03/21/2012 Document Reviewed: 01/31/2012 [facility] Patient Information 2015 [place], [place]. This information is not intended to replace advice given to you by your health care provider. Make sure you discuss any questions you have with your health care provider. Weakness Weakness is a lack of strength. You may feel weak all over your body or just in one part of your body. Weakness can be serious. In some cases, you may need more medical tests. HOME [place] a well-balanced diet.  Try to exercise every day.  Only take medicines as told by your doctor. GET HELP RIGHT AWAY IF:   You cannot do your normal daily activities.  You cannot walk  up and down stairs, or you feel very tired when you do so.  You have shortness of breath or chest pain.  You have trouble moving parts of your body.  You have weakness in only one body part or on only one side of the body.  You have a fever.  You have trouble speaking or swallowing.  You cannot control when you pee (urinate) or poop (bowel movement).  You have black or bloody throw up (vomit) or poop.  Your weakness gets worse or spreads to other body parts.  You have new aches or pains. MAKE SURE YOU:   Understand these instructions.  Will watch your condition.  Will get help right away if you are not doing well or get worse. Document Released: 06/09/2008 Document Revised: 12/27/2011 Document Reviewed: 08/26/2011 [facility] Patient Information 2015 [place], [place]. This information is not intended to replace advice given to you by your health care provider. Make sure you discuss any questions you have with your health care provider.
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
## 2013-12-29 NOTE — ED Notes (Signed)
Patient transported back from CT 

## 2014-01-01 ENCOUNTER — Other Ambulatory Visit: Payer: Self-pay | Admitting: Family Medicine

## 2014-01-01 DIAGNOSIS — R5381 Other malaise: Secondary | ICD-10-CM | POA: Diagnosis not present

## 2014-01-01 DIAGNOSIS — R42 Dizziness and giddiness: Secondary | ICD-10-CM

## 2014-01-01 DIAGNOSIS — R404 Transient alteration of awareness: Secondary | ICD-10-CM

## 2014-01-01 DIAGNOSIS — N390 Urinary tract infection, site not specified: Secondary | ICD-10-CM | POA: Diagnosis not present

## 2014-01-01 DIAGNOSIS — J019 Acute sinusitis, unspecified: Secondary | ICD-10-CM | POA: Diagnosis not present

## 2014-01-01 DIAGNOSIS — E7800 Pure hypercholesterolemia, unspecified: Secondary | ICD-10-CM | POA: Diagnosis not present

## 2014-01-01 DIAGNOSIS — I509 Heart failure, unspecified: Secondary | ICD-10-CM | POA: Diagnosis not present

## 2014-01-01 DIAGNOSIS — I10 Essential (primary) hypertension: Secondary | ICD-10-CM | POA: Diagnosis not present

## 2014-01-01 LAB — LIPID PANEL
Cholesterol: 175 mg/dL (ref 0–200)
HDL: 37 mg/dL (ref 35–70)
LDL Cholesterol: 111 mg/dL
LDl/HDL Ratio: 3
Triglycerides: 133 mg/dL (ref 40–160)

## 2014-01-03 DIAGNOSIS — J018 Other acute sinusitis: Secondary | ICD-10-CM | POA: Diagnosis not present

## 2014-01-03 DIAGNOSIS — J342 Deviated nasal septum: Secondary | ICD-10-CM | POA: Diagnosis not present

## 2014-01-03 DIAGNOSIS — H6120 Impacted cerumen, unspecified ear: Secondary | ICD-10-CM | POA: Diagnosis not present

## 2014-01-03 DIAGNOSIS — R404 Transient alteration of awareness: Secondary | ICD-10-CM | POA: Diagnosis not present

## 2014-01-03 DIAGNOSIS — R42 Dizziness and giddiness: Secondary | ICD-10-CM | POA: Diagnosis not present

## 2014-01-03 DIAGNOSIS — G4733 Obstructive sleep apnea (adult) (pediatric): Secondary | ICD-10-CM | POA: Diagnosis not present

## 2014-01-12 ENCOUNTER — Other Ambulatory Visit: Payer: PRIVATE HEALTH INSURANCE

## 2014-01-20 DIAGNOSIS — M545 Low back pain, unspecified: Secondary | ICD-10-CM | POA: Diagnosis not present

## 2014-01-20 DIAGNOSIS — M549 Dorsalgia, unspecified: Secondary | ICD-10-CM | POA: Diagnosis not present

## 2014-01-20 DIAGNOSIS — R030 Elevated blood-pressure reading, without diagnosis of hypertension: Secondary | ICD-10-CM | POA: Diagnosis not present

## 2014-01-20 DIAGNOSIS — Z6841 Body Mass Index (BMI) 40.0 and over, adult: Secondary | ICD-10-CM | POA: Diagnosis not present

## 2014-01-24 DIAGNOSIS — N320 Bladder-neck obstruction: Secondary | ICD-10-CM | POA: Diagnosis not present

## 2014-01-24 DIAGNOSIS — N3000 Acute cystitis without hematuria: Secondary | ICD-10-CM | POA: Diagnosis not present

## 2014-01-24 DIAGNOSIS — N529 Male erectile dysfunction, unspecified: Secondary | ICD-10-CM | POA: Diagnosis not present

## 2014-01-24 DIAGNOSIS — N3942 Incontinence without sensory awareness: Secondary | ICD-10-CM | POA: Diagnosis not present

## 2014-01-24 DIAGNOSIS — N3020 Other chronic cystitis without hematuria: Secondary | ICD-10-CM | POA: Diagnosis not present

## 2014-01-27 DIAGNOSIS — R310 Gross hematuria: Secondary | ICD-10-CM | POA: Diagnosis not present

## 2014-01-27 DIAGNOSIS — N200 Calculus of kidney: Secondary | ICD-10-CM | POA: Diagnosis not present

## 2014-01-30 DIAGNOSIS — M545 Low back pain, unspecified: Secondary | ICD-10-CM | POA: Diagnosis not present

## 2014-02-03 ENCOUNTER — Emergency Department (HOSPITAL_COMMUNITY)
Admission: EM | Admit: 2014-02-03 | Discharge: 2014-02-03 | Disposition: A | Discharge status: Home / Self Care | Payer: Medicare Other | Attending: Emergency Medicine | Admitting: Emergency Medicine

## 2014-02-03 ENCOUNTER — Encounter (HOSPITAL_COMMUNITY): Payer: Self-pay | Admitting: Emergency Medicine

## 2014-02-03 DIAGNOSIS — G8929 Other chronic pain: Secondary | ICD-10-CM | POA: Insufficient documentation

## 2014-02-03 DIAGNOSIS — I2510 Atherosclerotic heart disease of native coronary artery without angina pectoris: Secondary | ICD-10-CM | POA: Diagnosis not present

## 2014-02-03 DIAGNOSIS — E039 Hypothyroidism, unspecified: Secondary | ICD-10-CM | POA: Diagnosis not present

## 2014-02-03 DIAGNOSIS — Z8701 Personal history of pneumonia (recurrent): Secondary | ICD-10-CM | POA: Diagnosis not present

## 2014-02-03 DIAGNOSIS — Z9889 Other specified postprocedural states: Secondary | ICD-10-CM | POA: Insufficient documentation

## 2014-02-03 DIAGNOSIS — E786 Lipoprotein deficiency: Secondary | ICD-10-CM | POA: Diagnosis not present

## 2014-02-03 DIAGNOSIS — R110 Nausea: Secondary | ICD-10-CM | POA: Diagnosis not present

## 2014-02-03 DIAGNOSIS — Z8709 Personal history of other diseases of the respiratory system: Secondary | ICD-10-CM | POA: Insufficient documentation

## 2014-02-03 DIAGNOSIS — G4733 Obstructive sleep apnea (adult) (pediatric): Secondary | ICD-10-CM | POA: Insufficient documentation

## 2014-02-03 DIAGNOSIS — I10 Essential (primary) hypertension: Secondary | ICD-10-CM | POA: Insufficient documentation

## 2014-02-03 DIAGNOSIS — Z9981 Dependence on supplemental oxygen: Secondary | ICD-10-CM | POA: Insufficient documentation

## 2014-02-03 DIAGNOSIS — N390 Urinary tract infection, site not specified: Secondary | ICD-10-CM | POA: Diagnosis not present

## 2014-02-03 DIAGNOSIS — R319 Hematuria, unspecified: Secondary | ICD-10-CM | POA: Diagnosis not present

## 2014-02-03 DIAGNOSIS — R6883 Chills (without fever): Secondary | ICD-10-CM | POA: Insufficient documentation

## 2014-02-03 DIAGNOSIS — R700 Elevated erythrocyte sedimentation rate: Secondary | ICD-10-CM | POA: Diagnosis not present

## 2014-02-03 DIAGNOSIS — M479 Spondylosis, unspecified: Secondary | ICD-10-CM | POA: Insufficient documentation

## 2014-02-03 DIAGNOSIS — Z79899 Other long term (current) drug therapy: Secondary | ICD-10-CM | POA: Diagnosis not present

## 2014-02-03 DIAGNOSIS — Z8619 Personal history of other infectious and parasitic diseases: Secondary | ICD-10-CM | POA: Diagnosis not present

## 2014-02-03 DIAGNOSIS — M545 Low back pain, unspecified: Secondary | ICD-10-CM | POA: Insufficient documentation

## 2014-02-03 DIAGNOSIS — Z8673 Personal history of transient ischemic attack (TIA), and cerebral infarction without residual deficits: Secondary | ICD-10-CM | POA: Diagnosis not present

## 2014-02-03 DIAGNOSIS — M5430 Sciatica, unspecified side: Secondary | ICD-10-CM | POA: Diagnosis not present

## 2014-02-03 DIAGNOSIS — Z87442 Personal history of urinary calculi: Secondary | ICD-10-CM | POA: Diagnosis not present

## 2014-02-03 DIAGNOSIS — I252 Old myocardial infarction: Secondary | ICD-10-CM | POA: Diagnosis not present

## 2014-02-03 LAB — CBC WITH DIFFERENTIAL/PLATELET
Basophils Absolute: 0 10*3/uL (ref 0.0–0.1)
Basophils Relative: 1 % (ref 0–1)
Eosinophils Absolute: 0.3 10*3/uL (ref 0.0–0.7)
Eosinophils Relative: 4 % (ref 0–5)
HCT: 43.7 % (ref 39.0–52.0)
Hemoglobin: 14.5 g/dL (ref 13.0–17.0)
Lymphocytes Relative: 24 % (ref 12–46)
Lymphs Abs: 1.6 10*3/uL (ref 0.7–4.0)
MCH: 27.4 pg (ref 26.0–34.0)
MCHC: 33.2 g/dL (ref 30.0–36.0)
MCV: 82.5 fL (ref 78.0–100.0)
Monocytes Absolute: 0.4 10*3/uL (ref 0.1–1.0)
Monocytes Relative: 7 % (ref 3–12)
Neutro Abs: 4.3 10*3/uL (ref 1.7–7.7)
Neutrophils Relative %: 64 % (ref 43–77)
Platelets: 184 10*3/uL (ref 150–400)
RBC: 5.3 MIL/uL (ref 4.22–5.81)
RDW: 13.9 % (ref 11.5–15.5)
WBC: 6.6 10*3/uL (ref 4.0–10.5)

## 2014-02-03 LAB — URINALYSIS, ROUTINE W REFLEX MICROSCOPIC
Bilirubin Urine: NEGATIVE
Glucose, UA: NEGATIVE mg/dL
Hgb urine dipstick: NEGATIVE
Ketones, ur: NEGATIVE mg/dL
Leukocytes, UA: NEGATIVE
Nitrite: NEGATIVE
Protein, ur: NEGATIVE mg/dL
Specific Gravity, Urine: 1.008 (ref 1.005–1.030)
Urobilinogen, UA: 0.2 mg/dL (ref 0.0–1.0)
pH: 5 (ref 5.0–8.0)

## 2014-02-03 LAB — SEDIMENTATION RATE: Sed Rate: 22 mm/hr — ABNORMAL HIGH (ref 0–16)

## 2014-02-03 LAB — BASIC METABOLIC PANEL
Anion gap: 13 (ref 5–15)
BUN: 15 mg/dL (ref 6–23)
CO2: 30 mEq/L (ref 19–32)
Calcium: 10.1 mg/dL (ref 8.4–10.5)
Chloride: 97 mEq/L (ref 96–112)
Creatinine, Ser: 1.2 mg/dL (ref 0.50–1.35)
GFR calc Af Amer: 70 mL/min — ABNORMAL LOW (ref >90)
GFR calc non Af Amer: 60 mL/min — ABNORMAL LOW (ref >90)
Glucose, Bld: 106 mg/dL — ABNORMAL HIGH (ref 70–99)
Potassium: 3.9 mEq/L (ref 3.7–5.3)
Sodium: 140 mEq/L (ref 137–147)

## 2014-02-03 LAB — C-REACTIVE PROTEIN: CRP: 0.5 mg/dL — ABNORMAL LOW (ref <0.60)

## 2014-02-03 MED ORDER — HYDROMORPHONE HCL PF 1 MG/ML IJ SOLN
1.0000 mg | Freq: Once | INTRAMUSCULAR | Status: AC
  Administered 2014-02-03: 1 mg via INTRAVENOUS
  Filled 2014-02-03: qty 1

## 2014-02-03 MED ORDER — OXYCODONE-ACETAMINOPHEN 10-325 MG PO TABS: 1.0000 | ORAL_TABLET | ORAL | Status: DC | PRN

## 2014-02-03 MED ORDER — CIPROFLOXACIN HCL 500 MG PO TABS: 500.0000 mg | ORAL_TABLET | Freq: Two times a day (BID) | ORAL | Status: DC

## 2014-02-03 MED ORDER — SODIUM CHLORIDE 0.9 % IV BOLUS (SEPSIS)
1000.0000 mL | Freq: Once | INTRAVENOUS | Status: AC
  Administered 2014-02-03: 1000 mL via INTRAVENOUS

## 2014-02-03 MED ORDER — NAPROXEN 500 MG PO TABS: 500.0000 mg | ORAL_TABLET | Freq: Two times a day (BID) | ORAL | Status: DC

## 2014-02-03 NOTE — ED Notes (Signed)
Pt states had CT last week that showed a small kidney stone. Pt had MRI on Thursday for his neurosurgeon bc having back pain and has rods and screws in back from surgeries.  Pt having severe pain in back and having increased in frequency of urination.
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
## 2014-02-03 NOTE — Discharge Instructions (Signed)
Call Dr. Ronnald Ramp for a follow up appointment.  Call for a follow up appointment with a Family or Primary Care Provider.  Return if Symptoms worsen.   Take medication as prescribed.  Stop taking Meloxicam, or Mobic if you take the Naproxen.

## 2014-02-03 NOTE — ED Notes (Signed)
Pt states that Dr Tresa Moore wants to be notified when pt is here.

## 2014-02-03 NOTE — ED Notes (Signed)
PA Parker at bedside.

## 2014-02-04 NOTE — ED Provider Notes (Signed)
Medical screening examination/treatment/procedure(s) were conducted as a shared visit with non-physician practitioner(s) and myself.  I personally evaluated the patient during the encounter.   EKG Interpretation None       Patient with worsening chronic pain. Concerning MRI, after discussion with [facility] this is unlikely to be osteo. No neurologic symptoms or fevers. [facility] recommends against repeat imaging. Also consulted urology, their CT shows only a 2 mm stone, no flank pain at this time. Will treat symptomatically and given f/u with neurosurg and urology.  Ephraim Hamburger, MD 02/04/14 1539

## 2014-02-11 DIAGNOSIS — F320 Major depressive disorder, single episode, mild: Secondary | ICD-10-CM | POA: Diagnosis not present

## 2014-02-11 DIAGNOSIS — I509 Heart failure, unspecified: Secondary | ICD-10-CM | POA: Diagnosis not present

## 2014-02-11 DIAGNOSIS — N400 Enlarged prostate without lower urinary tract symptoms: Secondary | ICD-10-CM | POA: Diagnosis not present

## 2014-02-11 DIAGNOSIS — I10 Essential (primary) hypertension: Secondary | ICD-10-CM | POA: Diagnosis not present

## 2014-02-11 DIAGNOSIS — M549 Dorsalgia, unspecified: Secondary | ICD-10-CM | POA: Diagnosis not present

## 2014-02-11 DIAGNOSIS — I2510 Atherosclerotic heart disease of native coronary artery without angina pectoris: Secondary | ICD-10-CM | POA: Diagnosis not present

## 2014-02-13 ENCOUNTER — Ambulatory Visit (INDEPENDENT_AMBULATORY_CARE_PROVIDER_SITE_OTHER): Payer: Medicare Other | Admitting: Cardiovascular Disease

## 2014-02-13 ENCOUNTER — Encounter: Payer: Self-pay | Admitting: Cardiovascular Disease

## 2014-02-13 VITALS — BP 148/108 | HR 66 | Ht 79.0 in | Wt 365.0 lb

## 2014-02-13 DIAGNOSIS — I2510 Atherosclerotic heart disease of native coronary artery without angina pectoris: Secondary | ICD-10-CM

## 2014-02-13 DIAGNOSIS — I10 Essential (primary) hypertension: Secondary | ICD-10-CM | POA: Diagnosis not present

## 2014-02-13 DIAGNOSIS — E785 Hyperlipidemia, unspecified: Secondary | ICD-10-CM | POA: Diagnosis not present

## 2014-02-13 MED ORDER — LOSARTAN POTASSIUM 50 MG PO TABS: 50.0000 mg | ORAL_TABLET | Freq: Every day | ORAL | Status: DC

## 2014-02-13 NOTE — Patient Instructions (Addendum)
Your physician wants you to follow-up in:  6 months. You will receive a reminder letter in the mail two months in advance. If you don't receive a letter, please call our office to schedule the follow-up appointment.   Your physician has requested that you have an echocardiogram. Echocardiography is a painless test that uses sound waves to create images of your heart. It provides your doctor with information about the size and shape of your heart and how well your heart's chambers and valves are working. This procedure takes approximately one hour. There are no restrictions for this procedure.  Your physician has recommended you make the following change in your medication: Start Losartan 50 mg by mouth daily
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
## 2014-02-17 ENCOUNTER — Telehealth: Payer: Self-pay | Admitting: Cardiovascular Disease

## 2014-02-17 NOTE — Telephone Encounter (Signed)
ROI faxed to [facility] @ [phone]   8.10.15/km

## 2014-02-17 NOTE — Telephone Encounter (Signed)
Rec Received From [place] gave to [facility]  8.10.15/km

## 2014-02-24 ENCOUNTER — Ambulatory Visit (HOSPITAL_COMMUNITY): Payer: Medicare Other | Attending: Cardiology | Admitting: Radiology

## 2014-02-24 DIAGNOSIS — I2510 Atherosclerotic heart disease of native coronary artery without angina pectoris: Secondary | ICD-10-CM | POA: Diagnosis not present

## 2014-02-24 NOTE — Progress Notes (Signed)
Echocardiogram performed.  

## 2014-02-27 ENCOUNTER — Telehealth: Payer: Self-pay | Admitting: Cardiovascular Disease

## 2014-02-27 ENCOUNTER — Telehealth: Payer: Self-pay | Admitting: *Deleted

## 2014-02-27 DIAGNOSIS — R600 Localized edema: Secondary | ICD-10-CM

## 2014-02-27 NOTE — Telephone Encounter (Signed)
Spoke with Dennis [name] who reports he continues to have swelling in feet,ankles and lower legs. Dennis [name] states he discussed this at recent office visit and [name] wanted to get echo.  Echo has been done. Dennis [name] is asking if [name] has any suggestions for swelling or possible cause of swelling.

## 2014-02-27 NOTE — Telephone Encounter (Signed)
New Message  Pt called to follow Up on ECHO results please call

## 2014-02-27 NOTE — Telephone Encounter (Signed)
Spoke with pt and reviewed echo results with him.  

## 2014-03-03 MED ORDER — TORSEMIDE 20 MG PO TABS: 20.0000 mg | ORAL_TABLET | Freq: Two times a day (BID) | ORAL | Status: DC

## 2014-03-03 NOTE — Telephone Encounter (Signed)
Spoke with pt and gave him instructions from [name]. Will send prescription for increased dose of torsemide to [facility] on [address] in [place]. He will come in for [facility] on September 1,2015.  He has compression stockings at home but does not like wearing.

## 2014-03-03 NOTE — Telephone Encounter (Signed)
He can double his torsemide and see if this helps with LE edema. (torsemide 20 mg po BID). He can wear compression stockings and elevate feet when sitting. Will need BMET in 7-10 days if he doubles torsemide. Dennis [name]

## 2014-03-04 ENCOUNTER — Ambulatory Visit: Payer: PRIVATE HEALTH INSURANCE | Admitting: Cardiovascular Disease

## 2014-03-04 DIAGNOSIS — M549 Dorsalgia, unspecified: Secondary | ICD-10-CM | POA: Diagnosis not present

## 2014-03-04 DIAGNOSIS — I10 Essential (primary) hypertension: Secondary | ICD-10-CM | POA: Diagnosis not present

## 2014-03-04 DIAGNOSIS — Z6838 Body mass index (BMI) 38.0-38.9, adult: Secondary | ICD-10-CM | POA: Diagnosis not present

## 2014-03-11 ENCOUNTER — Other Ambulatory Visit (INDEPENDENT_AMBULATORY_CARE_PROVIDER_SITE_OTHER): Payer: Medicare Other

## 2014-03-11 ENCOUNTER — Other Ambulatory Visit: Payer: Self-pay | Admitting: *Deleted

## 2014-03-11 DIAGNOSIS — R609 Edema, unspecified: Secondary | ICD-10-CM

## 2014-03-11 DIAGNOSIS — R600 Localized edema: Secondary | ICD-10-CM

## 2014-03-11 LAB — BASIC METABOLIC PANEL
BUN: 10 mg/dL (ref 6–23)
CO2: 30 mEq/L (ref 19–32)
Calcium: 9.3 mg/dL (ref 8.4–10.5)
Chloride: 101 mEq/L (ref 96–112)
Creatinine, Ser: 1.2 mg/dL (ref 0.4–1.5)
GFR: 67.13 mL/min (ref >60.00)
Glucose, Bld: 85 mg/dL (ref 70–99)
Potassium: 3.1 mEq/L — ABNORMAL LOW (ref 3.5–5.1)
Sodium: 137 mEq/L (ref 135–145)

## 2014-03-11 MED ORDER — POTASSIUM CHLORIDE CRYS ER 20 MEQ PO TBCR: 40.0000 meq | EXTENDED_RELEASE_TABLET | Freq: Every day | ORAL | Status: DC

## 2014-03-13 DIAGNOSIS — M549 Dorsalgia, unspecified: Secondary | ICD-10-CM | POA: Diagnosis not present

## 2014-03-18 DIAGNOSIS — M549 Dorsalgia, unspecified: Secondary | ICD-10-CM | POA: Diagnosis not present

## 2014-03-21 DIAGNOSIS — M549 Dorsalgia, unspecified: Secondary | ICD-10-CM | POA: Diagnosis not present

## 2014-03-25 DIAGNOSIS — M549 Dorsalgia, unspecified: Secondary | ICD-10-CM | POA: Diagnosis not present

## 2014-03-28 DIAGNOSIS — M549 Dorsalgia, unspecified: Secondary | ICD-10-CM | POA: Diagnosis not present

## 2014-04-01 DIAGNOSIS — M549 Dorsalgia, unspecified: Secondary | ICD-10-CM | POA: Diagnosis not present

## 2014-04-08 DIAGNOSIS — M549 Dorsalgia, unspecified: Secondary | ICD-10-CM | POA: Diagnosis not present

## 2014-04-15 ENCOUNTER — Other Ambulatory Visit: Payer: Self-pay | Admitting: Neurological Surgery

## 2014-04-15 DIAGNOSIS — M5440 Lumbago with sciatica, unspecified side: Secondary | ICD-10-CM

## 2014-04-15 DIAGNOSIS — I10 Essential (primary) hypertension: Secondary | ICD-10-CM | POA: Diagnosis not present

## 2014-04-15 DIAGNOSIS — Z6841 Body Mass Index (BMI) 40.0 and over, adult: Secondary | ICD-10-CM | POA: Diagnosis not present

## 2014-04-16 ENCOUNTER — Telehealth: Payer: Self-pay | Admitting: Cardiovascular Disease

## 2014-04-16 DIAGNOSIS — N3020 Other chronic cystitis without hematuria: Secondary | ICD-10-CM | POA: Diagnosis not present

## 2014-04-16 DIAGNOSIS — I10 Essential (primary) hypertension: Secondary | ICD-10-CM

## 2014-04-16 DIAGNOSIS — R3914 Feeling of incomplete bladder emptying: Secondary | ICD-10-CM | POA: Diagnosis not present

## 2014-04-16 DIAGNOSIS — R310 Gross hematuria: Secondary | ICD-10-CM | POA: Diagnosis not present

## 2014-04-16 NOTE — Telephone Encounter (Signed)
New problem    Pt need to speak to nurse concerning his high blood pressure and possible medication change. Please call pt.

## 2014-04-16 NOTE — Telephone Encounter (Signed)
I spoke with the pt and he is having issues with his BP increasing over the past 3 weeks.  Yesterday BP was 168/135 and today 235/190.  The pt is under extreme pain and stress at this time. The pt is being treated by [facility] twice a week due to muscle pain.  The pt's muscles are locking and he is having issues with trigger points.  The pt is getting electrical pulsation performed at full strength and this is not helping. His muscles are also causing weakness and gait disturbance.  The pt is taking oxycontin on a regular basis and Ibuprofen 2400mg  daily.   Due to elevated BP the pt also has a headache and feels "out of sorts".  The pt would like to know what to do about his BP running so high. I will forward this message to Dr Angelena Form to review and make further recommendations.

## 2014-04-17 DIAGNOSIS — M545 Low back pain: Secondary | ICD-10-CM | POA: Diagnosis not present

## 2014-04-17 MED ORDER — LOSARTAN POTASSIUM 100 MG PO TABS: 100.0000 mg | ORAL_TABLET | Freq: Every day | ORAL | Status: DC

## 2014-04-17 NOTE — Telephone Encounter (Signed)
Follow up  ° ° ° °Returning call back to nurse  °

## 2014-04-17 NOTE — Telephone Encounter (Signed)
Left message for pt to call back  °

## 2014-04-17 NOTE — Telephone Encounter (Addendum)
Spoke with pt and gave him instructions from [name]. He will come in for BP check and BMP on April 22, 2014 at 10:00.

## 2014-04-17 NOTE — Telephone Encounter (Signed)
Pat, Can we have him increase his Cozaar to 100 mg daily and come by early next week for nurse visit BP check and BMET? Thanks, chris

## 2014-04-17 NOTE — Telephone Encounter (Signed)
Left message to call back  

## 2014-04-22 ENCOUNTER — Other Ambulatory Visit: Payer: Medicare Other

## 2014-04-24 DIAGNOSIS — M545 Low back pain: Secondary | ICD-10-CM | POA: Diagnosis not present

## 2014-04-25 ENCOUNTER — Encounter: Payer: Self-pay | Admitting: *Deleted

## 2014-04-25 ENCOUNTER — Telehealth: Payer: Self-pay | Admitting: *Deleted

## 2014-04-25 ENCOUNTER — Ambulatory Visit (INDEPENDENT_AMBULATORY_CARE_PROVIDER_SITE_OTHER): Payer: Medicare Other | Admitting: *Deleted

## 2014-04-25 ENCOUNTER — Other Ambulatory Visit (INDEPENDENT_AMBULATORY_CARE_PROVIDER_SITE_OTHER): Payer: Medicare Other | Admitting: *Deleted

## 2014-04-25 VITALS — BP 180/108 | HR 72 | Wt 367.0 lb

## 2014-04-25 DIAGNOSIS — I10 Essential (primary) hypertension: Secondary | ICD-10-CM

## 2014-04-25 LAB — BASIC METABOLIC PANEL
BUN: 16 mg/dL (ref 6–23)
CO2: 28 mEq/L (ref 19–32)
Calcium: 9.5 mg/dL (ref 8.4–10.5)
Chloride: 100 mEq/L (ref 96–112)
Creatinine, Ser: 1.2 mg/dL (ref 0.4–1.5)
GFR: 66.44 mL/min (ref >60.00)
Glucose, Bld: 109 mg/dL — ABNORMAL HIGH (ref 70–99)
Potassium: 3.6 mEq/L (ref 3.5–5.1)
Sodium: 135 mEq/L (ref 135–145)

## 2014-04-25 MED ORDER — POTASSIUM CHLORIDE CRYS ER 20 MEQ PO TBCR: 20.0000 meq | EXTENDED_RELEASE_TABLET | Freq: Every day | ORAL | Status: DC

## 2014-04-25 MED ORDER — CARVEDILOL 12.5 MG PO TABS
12.5000 mg | ORAL_TABLET | Freq: Two times a day (BID) | ORAL | Status: DC
Start: 2014-04-25 — End: 2014-12-02

## 2014-04-25 MED ORDER — SPIRONOLACTONE 25 MG PO TABS: 12.5000 mg | ORAL_TABLET | Freq: Every day | ORAL | Status: DC

## 2014-04-25 NOTE — Progress Notes (Signed)
Pt here for blood pressure check and lab work. Is going to physical therapy for back pain. Continues to have significant back issues. Moves very slowly due to back pain. Reports feeling washed out and tired. Feet and ankles with continued swelling. Our medication list indicate he takes Coreg 3.125 mg twice daily but pt reports he has been taking Coreg 6.25 mg twice daily. List updated. He is taking torsemide 20 mg twice daily. List updated.  He reports blood pressure has been elevated at home. Reports readings of 165-203/110-189. No change since Cozaar increased. Elevated also at physical therapy. Also being treated for chronic UTI. Vital signs as recorded today. Reviewed with Truitt Merle, NP and instructions given for pt to increase Coreg to 12. 5 mg by mouth twice daily, wear support stockings and remove salt from diet. Additional recommendations may possibly be made once lab results from today are available.  He should follow up with NP or PA in one month. Pt notified of instructions. He has 2 pairs of support stockings at home. Reports he eats out frequently. He is aware that restaurant food is usually high in salt and will try to watch diet. Does not add salt at home. Will send new prescription to [facility] in [place]. Follow up appt made with Truitt Merle, NP for June 02, 2014 at 10:30.

## 2014-04-25 NOTE — Telephone Encounter (Signed)
Spoke with pt and gave him instructions from Truitt Merle, NP to start Aldactone 12. 5 mg by mouth daily, decrease potassium to 20 meq by mouth daily and repeat BMP on 10/23. He will update [name] in one week on his swelling. Will send prescription to [facility]. He is aware he can come in anytime after 7:30 AM for lab work.

## 2014-04-29 DIAGNOSIS — M545 Low back pain: Secondary | ICD-10-CM | POA: Diagnosis not present

## 2014-05-02 ENCOUNTER — Other Ambulatory Visit (INDEPENDENT_AMBULATORY_CARE_PROVIDER_SITE_OTHER): Payer: Medicare Other | Admitting: *Deleted

## 2014-05-02 ENCOUNTER — Telehealth: Payer: Self-pay | Admitting: *Deleted

## 2014-05-02 DIAGNOSIS — I10 Essential (primary) hypertension: Secondary | ICD-10-CM

## 2014-05-02 LAB — BASIC METABOLIC PANEL
BUN: 17 mg/dL (ref 6–23)
CO2: 26 mEq/L (ref 19–32)
Calcium: 9.4 mg/dL (ref 8.4–10.5)
Chloride: 101 mEq/L (ref 96–112)
Creatinine, Ser: 1.4 mg/dL (ref 0.4–1.5)
GFR: 53.92 mL/min — ABNORMAL LOW (ref >60.00)
Glucose, Bld: 102 mg/dL — ABNORMAL HIGH (ref 70–99)
Potassium: 3.8 mEq/L (ref 3.5–5.1)
Sodium: 136 mEq/L (ref 135–145)

## 2014-05-02 NOTE — Telephone Encounter (Signed)
Spoke with pt and reviewed lab results with him. He has not weighed or checked blood pressure recently but he reports he is feeling much better. He reports swelling in feet and ankles is gone. Energy level much better. He will begin checking blood pressure again and let us know if elevated. Will also check weights.

## 2014-05-05 ENCOUNTER — Ambulatory Visit
Admission: RE | Admit: 2014-05-05 | Discharge: 2014-05-05 | Disposition: A | Discharge status: Home / Self Care | Payer: Medicare Other | Source: Ambulatory Visit | Attending: Neurological Surgery | Admitting: Neurological Surgery

## 2014-05-05 DIAGNOSIS — M4806 Spinal stenosis, lumbar region: Secondary | ICD-10-CM | POA: Diagnosis not present

## 2014-05-05 DIAGNOSIS — M5440 Lumbago with sciatica, unspecified side: Secondary | ICD-10-CM

## 2014-05-05 DIAGNOSIS — M47816 Spondylosis without myelopathy or radiculopathy, lumbar region: Secondary | ICD-10-CM | POA: Diagnosis not present

## 2014-05-05 DIAGNOSIS — M5126 Other intervertebral disc displacement, lumbar region: Secondary | ICD-10-CM | POA: Diagnosis not present

## 2014-05-05 NOTE — Telephone Encounter (Signed)
Spoke with pt and gave him instructions from Truitt Merle, NP

## 2014-05-05 NOTE — Telephone Encounter (Signed)
Follow Up ° °Pt returned call//  °

## 2014-05-05 NOTE — Telephone Encounter (Signed)
Reviewed with Truitt Merle, NP and pt should not start aldactone. Should continue torsemide 20 mg by mouth twice daily and potassium 20 meq by mouth daily. Does not need blood pressure check and labs next week. Keep scheduled appt with Truitt Merle, NP on June 02, 2014. I placed call to pt and left message to call back

## 2014-05-05 NOTE — Telephone Encounter (Signed)
Reviewed with Truitt Merle, NP and pt should decrease torsemide to 20 mg by mouth daily. Blood pressure check and BMP in one week.  I spoke with pt and gave him this information. Pt reports he did not start aldactone because he states pharmacy did not receive prescription (chart notes pharmacy confirmed receipt on April 25, 2014). Pt did decrease potassium to 20 meq daily. Pt states he took aldactone in the past but this was stopped by his MD in [place] and pt has not resumed.  Pt does not know weight and has not been checking blood pressure. Does report pant size has gone down by 2 sizes.  Will review with Truitt Merle, NP

## 2014-05-14 DIAGNOSIS — M7061 Trochanteric bursitis, right hip: Secondary | ICD-10-CM | POA: Diagnosis not present

## 2014-05-14 DIAGNOSIS — M7062 Trochanteric bursitis, left hip: Secondary | ICD-10-CM | POA: Diagnosis not present

## 2014-05-15 DIAGNOSIS — M7060 Trochanteric bursitis, unspecified hip: Secondary | ICD-10-CM | POA: Insufficient documentation

## 2014-05-15 DIAGNOSIS — Q828 Other specified congenital malformations of skin: Secondary | ICD-10-CM | POA: Diagnosis not present

## 2014-05-15 DIAGNOSIS — I2510 Atherosclerotic heart disease of native coronary artery without angina pectoris: Secondary | ICD-10-CM | POA: Diagnosis not present

## 2014-05-15 DIAGNOSIS — N400 Enlarged prostate without lower urinary tract symptoms: Secondary | ICD-10-CM | POA: Diagnosis not present

## 2014-05-16 DIAGNOSIS — S32009K Unspecified fracture of unspecified lumbar vertebra, subsequent encounter for fracture with nonunion: Secondary | ICD-10-CM | POA: Diagnosis not present

## 2014-05-26 DIAGNOSIS — N3000 Acute cystitis without hematuria: Secondary | ICD-10-CM | POA: Diagnosis not present

## 2014-05-26 DIAGNOSIS — N390 Urinary tract infection, site not specified: Secondary | ICD-10-CM | POA: Diagnosis not present

## 2014-05-26 DIAGNOSIS — N3020 Other chronic cystitis without hematuria: Secondary | ICD-10-CM | POA: Diagnosis not present

## 2014-06-02 ENCOUNTER — Ambulatory Visit (INDEPENDENT_AMBULATORY_CARE_PROVIDER_SITE_OTHER): Payer: Medicare Other | Admitting: Nurse Practitioner

## 2014-06-02 ENCOUNTER — Encounter: Payer: Self-pay | Admitting: Nurse Practitioner

## 2014-06-02 VITALS — BP 138/90 | HR 63 | Ht 79.0 in | Wt 362.8 lb

## 2014-06-02 DIAGNOSIS — I10 Essential (primary) hypertension: Secondary | ICD-10-CM | POA: Diagnosis not present

## 2014-06-02 DIAGNOSIS — I2510 Atherosclerotic heart disease of native coronary artery without angina pectoris: Secondary | ICD-10-CM | POA: Diagnosis not present

## 2014-06-02 DIAGNOSIS — R600 Localized edema: Secondary | ICD-10-CM | POA: Diagnosis not present

## 2014-06-02 NOTE — Patient Instructions (Addendum)
Stay on your current medicines  Continue to restrict your salt  See [name] as planned in February  Call the [place] office at [phone] if you have any questions, problems or concerns.
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
## 2014-06-10 DIAGNOSIS — N400 Enlarged prostate without lower urinary tract symptoms: Secondary | ICD-10-CM | POA: Diagnosis not present

## 2014-06-10 DIAGNOSIS — I2510 Atherosclerotic heart disease of native coronary artery without angina pectoris: Secondary | ICD-10-CM | POA: Diagnosis not present

## 2014-06-10 DIAGNOSIS — Q828 Other specified congenital malformations of skin: Secondary | ICD-10-CM | POA: Diagnosis not present

## 2014-06-10 DIAGNOSIS — Z23 Encounter for immunization: Secondary | ICD-10-CM | POA: Diagnosis not present

## 2014-07-31 DIAGNOSIS — G4733 Obstructive sleep apnea (adult) (pediatric): Secondary | ICD-10-CM | POA: Diagnosis not present

## 2014-07-31 DIAGNOSIS — R609 Edema, unspecified: Secondary | ICD-10-CM | POA: Diagnosis not present

## 2014-07-31 DIAGNOSIS — F329 Major depressive disorder, single episode, unspecified: Secondary | ICD-10-CM | POA: Diagnosis not present

## 2014-07-31 DIAGNOSIS — Z23 Encounter for immunization: Secondary | ICD-10-CM | POA: Diagnosis not present

## 2014-07-31 DIAGNOSIS — E039 Hypothyroidism, unspecified: Secondary | ICD-10-CM | POA: Diagnosis not present

## 2014-08-02 ENCOUNTER — Inpatient Hospital Stay (HOSPITAL_COMMUNITY)
Admission: EM | Admit: 2014-08-02 | Discharge: 2014-08-03 | DRG: 728 | Disposition: A | Discharge status: Home / Self Care | Payer: Medicare Other | Attending: Internal Medicine | Admitting: Internal Medicine

## 2014-08-02 ENCOUNTER — Encounter (HOSPITAL_COMMUNITY): Payer: Self-pay | Admitting: Emergency Medicine

## 2014-08-02 ENCOUNTER — Emergency Department (HOSPITAL_COMMUNITY): Payer: Medicare Other

## 2014-08-02 DIAGNOSIS — E039 Hypothyroidism, unspecified: Secondary | ICD-10-CM | POA: Diagnosis present

## 2014-08-02 DIAGNOSIS — M1990 Unspecified osteoarthritis, unspecified site: Secondary | ICD-10-CM | POA: Diagnosis present

## 2014-08-02 DIAGNOSIS — I252 Old myocardial infarction: Secondary | ICD-10-CM

## 2014-08-02 DIAGNOSIS — N492 Inflammatory disorders of scrotum: Secondary | ICD-10-CM | POA: Diagnosis not present

## 2014-08-02 DIAGNOSIS — N50812 Left testicular pain: Secondary | ICD-10-CM

## 2014-08-02 DIAGNOSIS — M109 Gout, unspecified: Secondary | ICD-10-CM | POA: Diagnosis present

## 2014-08-02 DIAGNOSIS — R946 Abnormal results of thyroid function studies: Secondary | ICD-10-CM | POA: Diagnosis present

## 2014-08-02 DIAGNOSIS — Z79899 Other long term (current) drug therapy: Secondary | ICD-10-CM

## 2014-08-02 DIAGNOSIS — R7989 Other specified abnormal findings of blood chemistry: Secondary | ICD-10-CM | POA: Diagnosis present

## 2014-08-02 DIAGNOSIS — G8929 Other chronic pain: Secondary | ICD-10-CM | POA: Diagnosis present

## 2014-08-02 DIAGNOSIS — I5030 Unspecified diastolic (congestive) heart failure: Secondary | ICD-10-CM | POA: Diagnosis not present

## 2014-08-02 DIAGNOSIS — Z833 Family history of diabetes mellitus: Secondary | ICD-10-CM

## 2014-08-02 DIAGNOSIS — Z91030 Bee allergy status: Secondary | ICD-10-CM

## 2014-08-02 DIAGNOSIS — Z791 Long term (current) use of non-steroidal anti-inflammatories (NSAID): Secondary | ICD-10-CM

## 2014-08-02 DIAGNOSIS — Z6841 Body Mass Index (BMI) 40.0 and over, adult: Secondary | ICD-10-CM

## 2014-08-02 DIAGNOSIS — Z87440 Personal history of urinary (tract) infections: Secondary | ICD-10-CM

## 2014-08-02 DIAGNOSIS — N442 Benign cyst of testis: Secondary | ICD-10-CM | POA: Diagnosis not present

## 2014-08-02 DIAGNOSIS — Z91013 Allergy to seafood: Secondary | ICD-10-CM

## 2014-08-02 DIAGNOSIS — Z79891 Long term (current) use of opiate analgesic: Secondary | ICD-10-CM

## 2014-08-02 DIAGNOSIS — N400 Enlarged prostate without lower urinary tract symptoms: Secondary | ICD-10-CM | POA: Diagnosis present

## 2014-08-02 DIAGNOSIS — Z885 Allergy status to narcotic agent status: Secondary | ICD-10-CM

## 2014-08-02 DIAGNOSIS — N508 Other specified disorders of male genital organs: Secondary | ICD-10-CM | POA: Diagnosis not present

## 2014-08-02 DIAGNOSIS — M545 Low back pain: Secondary | ICD-10-CM | POA: Diagnosis present

## 2014-08-02 DIAGNOSIS — Z8249 Family history of ischemic heart disease and other diseases of the circulatory system: Secondary | ICD-10-CM

## 2014-08-02 DIAGNOSIS — I10 Essential (primary) hypertension: Secondary | ICD-10-CM | POA: Diagnosis not present

## 2014-08-02 DIAGNOSIS — Z8673 Personal history of transient ischemic attack (TIA), and cerebral infarction without residual deficits: Secondary | ICD-10-CM

## 2014-08-02 DIAGNOSIS — Z96652 Presence of left artificial knee joint: Secondary | ICD-10-CM | POA: Diagnosis present

## 2014-08-02 DIAGNOSIS — I5032 Chronic diastolic (congestive) heart failure: Secondary | ICD-10-CM | POA: Diagnosis present

## 2014-08-02 DIAGNOSIS — I2510 Atherosclerotic heart disease of native coronary artery without angina pectoris: Secondary | ICD-10-CM | POA: Diagnosis present

## 2014-08-02 DIAGNOSIS — G4730 Sleep apnea, unspecified: Secondary | ICD-10-CM | POA: Diagnosis present

## 2014-08-02 DIAGNOSIS — J42 Unspecified chronic bronchitis: Secondary | ICD-10-CM | POA: Diagnosis present

## 2014-08-02 DIAGNOSIS — Z8701 Personal history of pneumonia (recurrent): Secondary | ICD-10-CM

## 2014-08-02 DIAGNOSIS — G4733 Obstructive sleep apnea (adult) (pediatric): Secondary | ICD-10-CM | POA: Diagnosis not present

## 2014-08-02 DIAGNOSIS — N433 Hydrocele, unspecified: Secondary | ICD-10-CM | POA: Diagnosis not present

## 2014-08-02 DIAGNOSIS — Z823 Family history of stroke: Secondary | ICD-10-CM

## 2014-08-02 DIAGNOSIS — E669 Obesity, unspecified: Secondary | ICD-10-CM | POA: Diagnosis present

## 2014-08-02 DIAGNOSIS — N451 Epididymitis: Secondary | ICD-10-CM | POA: Diagnosis present

## 2014-08-02 HISTORY — DX: Inflammatory disorders of scrotum: N49.2

## 2014-08-02 LAB — COMPREHENSIVE METABOLIC PANEL
ALT: 16 U/L (ref 0–53)
AST: 19 U/L (ref 0–37)
Albumin: 4 g/dL (ref 3.5–5.2)
Alkaline Phosphatase: 57 U/L (ref 39–117)
Anion gap: 5 (ref 5–15)
BUN: 13 mg/dL (ref 6–23)
CO2: 30 mmol/L (ref 19–32)
Calcium: 8.5 mg/dL (ref 8.4–10.5)
Chloride: 102 mmol/L (ref 96–112)
Creatinine, Ser: 0.99 mg/dL (ref 0.50–1.35)
GFR calc Af Amer: >90 mL/min (ref >90)
GFR calc non Af Amer: 82 mL/min — ABNORMAL LOW (ref >90)
Glucose, Bld: 97 mg/dL (ref 70–99)
Potassium: 3.7 mmol/L (ref 3.5–5.1)
Sodium: 137 mmol/L (ref 135–145)
Total Bilirubin: 1.6 mg/dL — ABNORMAL HIGH (ref 0.3–1.2)
Total Protein: 7.2 g/dL (ref 6.0–8.3)

## 2014-08-02 LAB — CBC WITH DIFFERENTIAL/PLATELET
Basophils Absolute: 0 10*3/uL (ref 0.0–0.1)
Basophils Relative: 0 % (ref 0–1)
Eosinophils Absolute: 0.2 10*3/uL (ref 0.0–0.7)
Eosinophils Relative: 3 % (ref 0–5)
HCT: 41.3 % (ref 39.0–52.0)
Hemoglobin: 13.7 g/dL (ref 13.0–17.0)
Lymphocytes Relative: 21 % (ref 12–46)
Lymphs Abs: 1.4 10*3/uL (ref 0.7–4.0)
MCH: 28.2 pg (ref 26.0–34.0)
MCHC: 33.2 g/dL (ref 30.0–36.0)
MCV: 85 fL (ref 78.0–100.0)
Monocytes Absolute: 0.4 10*3/uL (ref 0.1–1.0)
Monocytes Relative: 6 % (ref 3–12)
Neutro Abs: 4.6 10*3/uL (ref 1.7–7.7)
Neutrophils Relative %: 70 % (ref 43–77)
Platelets: 152 10*3/uL (ref 150–400)
RBC: 4.86 MIL/uL (ref 4.22–5.81)
RDW: 13.9 % (ref 11.5–15.5)
WBC: 6.6 10*3/uL (ref 4.0–10.5)

## 2014-08-02 MED ORDER — HYDROMORPHONE HCL 1 MG/ML IJ SOLN
1.0000 mg | INTRAMUSCULAR | Status: DC | PRN
  Administered 2014-08-02: 1 mg via INTRAVENOUS
  Filled 2014-08-02: qty 1

## 2014-08-02 MED ORDER — IBUPROFEN 200 MG PO TABS
600.0000 mg | ORAL_TABLET | Freq: Once | ORAL | Status: AC
  Administered 2014-08-02: 600 mg via ORAL
  Filled 2014-08-02: qty 3

## 2014-08-02 MED ORDER — PIPERACILLIN-TAZOBACTAM 3.375 G IVPB
3.3750 g | Freq: Once | INTRAVENOUS | Status: AC
  Administered 2014-08-02: 3.375 g via INTRAVENOUS
  Filled 2014-08-02: qty 50

## 2014-08-02 MED ORDER — MORPHINE SULFATE 4 MG/ML IJ SOLN
8.0000 mg | Freq: Once | INTRAMUSCULAR | Status: AC
  Administered 2014-08-02: 8 mg via INTRAVENOUS
  Filled 2014-08-02: qty 2

## 2014-08-02 MED ORDER — VANCOMYCIN HCL IN DEXTROSE 1-5 GM/200ML-% IV SOLN
1000.0000 mg | Freq: Once | INTRAVENOUS | Status: AC
  Administered 2014-08-03: 1000 mg via INTRAVENOUS
  Filled 2014-08-02: qty 200

## 2014-08-02 MED ORDER — DIPHENHYDRAMINE HCL 50 MG/ML IJ SOLN
25.0000 mg | Freq: Once | INTRAMUSCULAR | Status: AC
  Administered 2014-08-02: 25 mg via INTRAVENOUS
  Filled 2014-08-02: qty 1
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
## 2014-08-02 NOTE — ED Notes (Signed)
Pt c/o "huge balls" x 2 days.  Pt has testicular swelling and pain.  Purple discoloration.  Extensive urologic hx however has not had this problem before.  Bilateral testicles measure 10 cm in diameter.

## 2014-08-02 NOTE — ED Notes (Signed)
MD at bedside. 

## 2014-08-03 DIAGNOSIS — G4730 Sleep apnea, unspecified: Secondary | ICD-10-CM | POA: Diagnosis present

## 2014-08-03 DIAGNOSIS — R7989 Other specified abnormal findings of blood chemistry: Secondary | ICD-10-CM | POA: Diagnosis not present

## 2014-08-03 DIAGNOSIS — Z8673 Personal history of transient ischemic attack (TIA), and cerebral infarction without residual deficits: Secondary | ICD-10-CM | POA: Diagnosis not present

## 2014-08-03 DIAGNOSIS — E669 Obesity, unspecified: Secondary | ICD-10-CM | POA: Diagnosis present

## 2014-08-03 DIAGNOSIS — M1990 Unspecified osteoarthritis, unspecified site: Secondary | ICD-10-CM | POA: Diagnosis present

## 2014-08-03 DIAGNOSIS — N451 Epididymitis: Secondary | ICD-10-CM | POA: Diagnosis not present

## 2014-08-03 DIAGNOSIS — N508 Other specified disorders of male genital organs: Secondary | ICD-10-CM | POA: Diagnosis not present

## 2014-08-03 DIAGNOSIS — I5030 Unspecified diastolic (congestive) heart failure: Secondary | ICD-10-CM | POA: Diagnosis present

## 2014-08-03 DIAGNOSIS — Z885 Allergy status to narcotic agent status: Secondary | ICD-10-CM | POA: Diagnosis not present

## 2014-08-03 DIAGNOSIS — I252 Old myocardial infarction: Secondary | ICD-10-CM | POA: Diagnosis not present

## 2014-08-03 DIAGNOSIS — G4733 Obstructive sleep apnea (adult) (pediatric): Secondary | ICD-10-CM | POA: Diagnosis present

## 2014-08-03 DIAGNOSIS — Z823 Family history of stroke: Secondary | ICD-10-CM | POA: Diagnosis not present

## 2014-08-03 DIAGNOSIS — Z8701 Personal history of pneumonia (recurrent): Secondary | ICD-10-CM | POA: Diagnosis not present

## 2014-08-03 DIAGNOSIS — G8929 Other chronic pain: Secondary | ICD-10-CM | POA: Diagnosis present

## 2014-08-03 DIAGNOSIS — N492 Inflammatory disorders of scrotum: Secondary | ICD-10-CM | POA: Insufficient documentation

## 2014-08-03 DIAGNOSIS — Z91030 Bee allergy status: Secondary | ICD-10-CM | POA: Diagnosis not present

## 2014-08-03 DIAGNOSIS — Z833 Family history of diabetes mellitus: Secondary | ICD-10-CM | POA: Diagnosis not present

## 2014-08-03 DIAGNOSIS — Z79899 Other long term (current) drug therapy: Secondary | ICD-10-CM | POA: Diagnosis not present

## 2014-08-03 DIAGNOSIS — I10 Essential (primary) hypertension: Secondary | ICD-10-CM | POA: Diagnosis present

## 2014-08-03 DIAGNOSIS — I2510 Atherosclerotic heart disease of native coronary artery without angina pectoris: Secondary | ICD-10-CM | POA: Diagnosis present

## 2014-08-03 DIAGNOSIS — Z8249 Family history of ischemic heart disease and other diseases of the circulatory system: Secondary | ICD-10-CM | POA: Diagnosis not present

## 2014-08-03 DIAGNOSIS — M109 Gout, unspecified: Secondary | ICD-10-CM | POA: Diagnosis present

## 2014-08-03 DIAGNOSIS — Z6841 Body Mass Index (BMI) 40.0 and over, adult: Secondary | ICD-10-CM | POA: Diagnosis not present

## 2014-08-03 DIAGNOSIS — N400 Enlarged prostate without lower urinary tract symptoms: Secondary | ICD-10-CM | POA: Diagnosis present

## 2014-08-03 DIAGNOSIS — Z79891 Long term (current) use of opiate analgesic: Secondary | ICD-10-CM | POA: Diagnosis not present

## 2014-08-03 DIAGNOSIS — Z791 Long term (current) use of non-steroidal anti-inflammatories (NSAID): Secondary | ICD-10-CM | POA: Diagnosis not present

## 2014-08-03 DIAGNOSIS — M545 Low back pain: Secondary | ICD-10-CM | POA: Diagnosis present

## 2014-08-03 DIAGNOSIS — E039 Hypothyroidism, unspecified: Secondary | ICD-10-CM | POA: Diagnosis present

## 2014-08-03 DIAGNOSIS — Z96652 Presence of left artificial knee joint: Secondary | ICD-10-CM | POA: Diagnosis present

## 2014-08-03 DIAGNOSIS — Z91013 Allergy to seafood: Secondary | ICD-10-CM | POA: Diagnosis not present

## 2014-08-03 DIAGNOSIS — I5032 Chronic diastolic (congestive) heart failure: Secondary | ICD-10-CM | POA: Diagnosis not present

## 2014-08-03 DIAGNOSIS — J42 Unspecified chronic bronchitis: Secondary | ICD-10-CM | POA: Diagnosis present

## 2014-08-03 DIAGNOSIS — R946 Abnormal results of thyroid function studies: Secondary | ICD-10-CM | POA: Diagnosis present

## 2014-08-03 DIAGNOSIS — Z87440 Personal history of urinary (tract) infections: Secondary | ICD-10-CM | POA: Diagnosis not present

## 2014-08-03 LAB — URINALYSIS, ROUTINE W REFLEX MICROSCOPIC
Bilirubin Urine: NEGATIVE
Glucose, UA: NEGATIVE mg/dL
Hgb urine dipstick: NEGATIVE
Ketones, ur: NEGATIVE mg/dL
Leukocytes, UA: NEGATIVE
Nitrite: NEGATIVE
Protein, ur: NEGATIVE mg/dL
Specific Gravity, Urine: 1.016 (ref 1.005–1.030)
Urobilinogen, UA: 1 mg/dL (ref 0.0–1.0)
pH: 5.5 (ref 5.0–8.0)

## 2014-08-03 LAB — CBC
HCT: 39.2 % (ref 39.0–52.0)
Hemoglobin: 13 g/dL (ref 13.0–17.0)
MCH: 28.1 pg (ref 26.0–34.0)
MCHC: 33.2 g/dL (ref 30.0–36.0)
MCV: 84.8 fL (ref 78.0–100.0)
Platelets: 120 10*3/uL — ABNORMAL LOW (ref 150–400)
RBC: 4.62 MIL/uL (ref 4.22–5.81)
RDW: 14 % (ref 11.5–15.5)
WBC: 5.6 10*3/uL (ref 4.0–10.5)

## 2014-08-03 LAB — HEMOGLOBIN A1C
Hgb A1c MFr Bld: 5.2 % (ref <5.7)
Mean Plasma Glucose: 103 mg/dL (ref <117)

## 2014-08-03 LAB — COMPREHENSIVE METABOLIC PANEL
ALT: 14 U/L (ref 0–53)
AST: 13 U/L (ref 0–37)
Albumin: 3.6 g/dL (ref 3.5–5.2)
Alkaline Phosphatase: 51 U/L (ref 39–117)
Anion gap: 6 (ref 5–15)
BUN: 13 mg/dL (ref 6–23)
CO2: 33 mmol/L — ABNORMAL HIGH (ref 19–32)
Calcium: 8.7 mg/dL (ref 8.4–10.5)
Chloride: 102 mmol/L (ref 96–112)
Creatinine, Ser: 1.08 mg/dL (ref 0.50–1.35)
GFR calc Af Amer: 79 mL/min — ABNORMAL LOW (ref >90)
GFR calc non Af Amer: 69 mL/min — ABNORMAL LOW (ref >90)
Glucose, Bld: 115 mg/dL — ABNORMAL HIGH (ref 70–99)
Potassium: 3.4 mmol/L — ABNORMAL LOW (ref 3.5–5.1)
Sodium: 141 mmol/L (ref 135–145)
Total Bilirubin: 1.3 mg/dL — ABNORMAL HIGH (ref 0.3–1.2)
Total Protein: 6.5 g/dL (ref 6.0–8.3)

## 2014-08-03 LAB — MAGNESIUM: Magnesium: 1.8 mg/dL (ref 1.5–2.5)

## 2014-08-03 LAB — PHOSPHORUS: Phosphorus: 2.9 mg/dL (ref 2.3–4.6)

## 2014-08-03 LAB — TSH: TSH: 45.322 u[IU]/mL — ABNORMAL HIGH (ref 0.350–4.500)

## 2014-08-03 MED ORDER — ENOXAPARIN SODIUM 40 MG/0.4ML ~~LOC~~ SOLN
40.0000 mg | SUBCUTANEOUS | Status: DC
  Filled 2014-08-03: qty 0.4

## 2014-08-03 MED ORDER — ACETAMINOPHEN 325 MG PO TABS: 650.0000 mg | ORAL_TABLET | Freq: Four times a day (QID) | ORAL | Status: DC | PRN

## 2014-08-03 MED ORDER — SULFAMETHOXAZOLE-TRIMETHOPRIM 800-160 MG PO TABS
1.0000 | ORAL_TABLET | Freq: Two times a day (BID) | ORAL | Status: DC
  Administered 2014-08-03: 1 via ORAL
  Filled 2014-08-03 (×2): qty 1

## 2014-08-03 MED ORDER — SODIUM CHLORIDE 0.9 % IJ SOLN: 3.0000 mL | Freq: Two times a day (BID) | INTRAMUSCULAR | Status: DC

## 2014-08-03 MED ORDER — TORSEMIDE 20 MG PO TABS
20.0000 mg | ORAL_TABLET | Freq: Two times a day (BID) | ORAL | Status: DC
  Filled 2014-08-03 (×3): qty 1

## 2014-08-03 MED ORDER — SODIUM CHLORIDE 0.9 % IV SOLN: 250.0000 mL | INTRAVENOUS | Status: DC | PRN

## 2014-08-03 MED ORDER — ACETAMINOPHEN 650 MG RE SUPP: 650.0000 mg | Freq: Four times a day (QID) | RECTAL | Status: DC | PRN

## 2014-08-03 MED ORDER — ATORVASTATIN CALCIUM 20 MG PO TABS
20.0000 mg | ORAL_TABLET | Freq: Every day | ORAL | Status: DC
  Filled 2014-08-03: qty 1

## 2014-08-03 MED ORDER — SODIUM CHLORIDE 0.9 % IJ SOLN: 3.0000 mL | INTRAMUSCULAR | Status: DC | PRN

## 2014-08-03 MED ORDER — PIPERACILLIN-TAZOBACTAM 3.375 G IVPB
3.3750 g | Freq: Three times a day (TID) | INTRAVENOUS | Status: DC
  Administered 2014-08-03: 3.375 g via INTRAVENOUS
  Filled 2014-08-03 (×2): qty 50

## 2014-08-03 MED ORDER — LOSARTAN POTASSIUM 50 MG PO TABS
100.0000 mg | ORAL_TABLET | Freq: Every day | ORAL | Status: DC
  Administered 2014-08-03: 100 mg via ORAL
  Filled 2014-08-03: qty 2

## 2014-08-03 MED ORDER — SULFAMETHOXAZOLE-TRIMETHOPRIM 800-160 MG PO TABS: 1.0000 | ORAL_TABLET | Freq: Two times a day (BID) | ORAL | Status: DC

## 2014-08-03 MED ORDER — LEVOTHYROXINE SODIUM 25 MCG PO TABS
25.0000 ug | ORAL_TABLET | Freq: Every day | ORAL | Status: DC
  Administered 2014-08-03: 25 ug via ORAL
  Filled 2014-08-03 (×2): qty 1

## 2014-08-03 MED ORDER — ONDANSETRON HCL 4 MG/2ML IJ SOLN: 4.0000 mg | Freq: Four times a day (QID) | INTRAMUSCULAR | Status: DC | PRN

## 2014-08-03 MED ORDER — ALLOPURINOL 100 MG PO TABS
100.0000 mg | ORAL_TABLET | Freq: Every day | ORAL | Status: DC
  Administered 2014-08-03: 100 mg via ORAL
  Filled 2014-08-03: qty 1

## 2014-08-03 MED ORDER — VANCOMYCIN HCL 10 G IV SOLR
1500.0000 mg | Freq: Two times a day (BID) | INTRAVENOUS | Status: DC
  Filled 2014-08-03: qty 1500

## 2014-08-03 MED ORDER — ONDANSETRON HCL 4 MG PO TABS: 4.0000 mg | ORAL_TABLET | Freq: Four times a day (QID) | ORAL | Status: DC | PRN

## 2014-08-03 MED ORDER — DOCUSATE SODIUM 100 MG PO CAPS
100.0000 mg | ORAL_CAPSULE | Freq: Two times a day (BID) | ORAL | Status: DC
  Filled 2014-08-03 (×2): qty 1

## 2014-08-03 MED ORDER — VANCOMYCIN HCL IN DEXTROSE 1-5 GM/200ML-% IV SOLN
1000.0000 mg | Freq: Once | INTRAVENOUS | Status: AC
  Administered 2014-08-03: 1000 mg via INTRAVENOUS
  Filled 2014-08-03: qty 200

## 2014-08-03 MED ORDER — CARVEDILOL 12.5 MG PO TABS
12.5000 mg | ORAL_TABLET | Freq: Two times a day (BID) | ORAL | Status: DC
  Administered 2014-08-03: 12.5 mg via ORAL
  Filled 2014-08-03 (×2): qty 1

## 2014-08-03 MED ORDER — HYDROCODONE-ACETAMINOPHEN 10-325 MG PO TABS
1.0000 | ORAL_TABLET | ORAL | Status: DC | PRN
  Administered 2014-08-03: 1 via ORAL
  Filled 2014-08-03: qty 1
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
## 2014-08-03 NOTE — Discharge Summary (Signed)
Discharge Summary  Dennis SALINGER Sr. MR#: [id number]  DOB:29-May-1946  Date of Admission: 08/02/2014 Date of Discharge: 08/03/2014  Patient's PCP: Julieanne Manson, MD  Attending Physician:Nakeisha Greenhouse A  Consults: Urology - Dr. Laverle Patter  Discharge Diagnoses: Active Problems:   Cellulitis, scrotum   Hypertension   OSA on CPAP   Diastolic CHF   Epididymitis   Abnormal TSH   Brief Admitting History and Physical On admission: "Dennis A Beyl Sr. is a 69 y.o. male   has a past medical history of Complication of anesthesia; PONV (postoperative nausea and vomiting); Hypertension; Hypothyroidism; Arthritis; History of chronic bronchitis; Chronic lower back pain; History of gout; Hypocholesteremia; Elevated PSA; Myocardial infarction (2001); Pneumonia (2000's and 2013); OSA on CPAP; Stroke (2004); Coronary artery disease; and [facility] spotted fever.   Presented with  Started about 3 days ago he started to have some swelling and pain in his scrotum. Pain and swelling has persisted. It became severe. He noted some low-grade fevers and chills. And overall feeling unwell. His wife gave him a dose of Bactrim he had left over. The recommendation of his urologist he was referred to [facility] along emergency department. Patient have dealt in the past yeast infections. He has been using over-the-counter Lamisil cream with good results with patient denies being diabetic. NO perineal pain . Patient uses CPAP machine for sleep apnea. Emergency department he was started on vancomycin and Zosyn. Urology has been consulted and will see him in the morning.Patient reports that she's been seen urology in the past since removal of a benign prostate tumor in treatment of urinary tract infections with Bactrim. Per ER physician he attempted a scrotal ultrasound which was unremarkable.   Hospitalist was called for admission for scrotal cellulitis."  Discharge Medications   Medication List    TAKE these  medications        ALEVE PM PO  Take 2 tablets by mouth at bedtime as needed (sleep, back pain).     allopurinol 100 MG tablet  Commonly known as:  ZYLOPRIM  Take 100 mg by mouth daily.     atorvastatin 20 MG tablet  Commonly known as:  LIPITOR  Take 20 mg by mouth daily.     carvedilol 12.5 MG tablet  Commonly known as:  COREG  Take 1 tablet (12.5 mg total) by mouth 2 (two) times daily.     EPIPEN 0.3 mg/0.3 mL Soaj injection  Generic drug:  EPINEPHrine  Inject 0.3 mg into the muscle as needed (allergic reaction).     HYDROcodone-acetaminophen 10-325 MG per tablet  Commonly known as:  NORCO  Take 1 tablet by mouth as needed for severe pain.     levothyroxine 25 MCG tablet  Commonly known as:  SYNTHROID, LEVOTHROID  Take 25 mcg by mouth daily before breakfast.     losartan 100 MG tablet  Commonly known as:  COZAAR  Take 1 tablet (100 mg total) by mouth daily.     potassium chloride SA 20 MEQ tablet  Commonly known as:  K-DUR,KLOR-CON  Take 1 tablet (20 mEq total) by mouth daily.     sulfamethoxazole-trimethoprim 800-160 MG per tablet  Commonly known as:  BACTRIM DS,SEPTRA DS  Take 1 tablet by mouth every 12 (twelve) hours.     torsemide 20 MG tablet  Commonly known as:  DEMADEX  Take 20 mg by mouth 2 (two) times daily.        Hospital Course: <principal problem not specified> Present on Admission:  .  Cellulitis, scrotum . Hypertension . OSA on CPAP . Diastolic CHF . Epididymitis . Abnormal TSH   Cellulitis, scrotum/Left epididymitis - Patient initially started on vancomycin and Zosyn.  He also had ultrasound of his scrotum done with results as indicated below.   - Patient was evaluated by urology, Dr. Alinda Money, who thought findings were consistent with epididymitis, he did not have any signs to indicate Fournier's gangrene.   - Patient in the past has had good response to Bactrim, urology recommended transitioning patient's vancomycin and Zosyn to Bactrim  and discharging the patient with outpatient follow-up with Dr. Minus Liberty, urology. - Given patient's clinical improvement in the last 24 hours, patient and family were agreeable to the above plan (offered alternative plan of continuing IV antibiotics for 1 more day).  They also understand that should his symptoms worsen, he is to come back to the emergency department or contact his urologist for further care and management.  Hypertension  - Stable.  Continued on home medications.  OSA on CPAP  - Continue home C Pap.    Diastolic CHF  - Stable, not an active issue at this time.  Continue home torsemide.    Abnormal TSH/hypothyroidism - Continue on levothyroxine. - Patient and family instructed that they are to follow-up with his primary care physician to have free T4 checked, if abnormal consider adjusting levothyroxine dose.  Day of Discharge BP 160/72 mmHg  Pulse 62  Temp(Src) 98.1 F (36.7 C) (Oral)  Resp 18  Ht _0  (2.007 m)  Wt 163.703 kg (360 lb 14.4 oz)  BMI 40.64 kg/m2  SpO2 100%  Physical Exam: General: Awake, Oriented, No acute distress. HEENT: EOMI. Neck: Supple CV: S1 and S2 Lungs: Clear to ascultation bilaterally Abdomen: Soft, Nontender, Nondistended, +bowel sounds. Ext: Good pulses. Trace edema. GU: Scrotum enlarged and swollen erythematous.  Results for orders placed or performed during the hospital encounter of 08/02/14 (from the past 48 hour(s))  CBC with Differential/Platelet     Status: None   Collection Time: 08/02/14  7:45 PM  Result Value Ref Range   WBC 6.6 4.0 - 10.5 K/uL   RBC 4.86 4.22 - 5.81 MIL/uL   Hemoglobin 13.7 13.0 - 17.0 g/dL   HCT 41.3 39.0 - 52.0 %   MCV 85.0 78.0 - 100.0 fL   MCH 28.2 26.0 - 34.0 pg   MCHC 33.2 30.0 - 36.0 g/dL   RDW 13.9 11.5 - 15.5 %   Platelets 152 150 - 400 K/uL   Neutrophils Relative % 70 43 - 77 %   Neutro Abs 4.6 1.7 - 7.7 K/uL   Lymphocytes Relative 21 12 - 46 %   Lymphs Abs 1.4 0.7 - 4.0 K/uL    Monocytes Relative 6 3 - 12 %   Monocytes Absolute 0.4 0.1 - 1.0 K/uL   Eosinophils Relative 3 0 - 5 %   Eosinophils Absolute 0.2 0.0 - 0.7 K/uL   Basophils Relative 0 0 - 1 %   Basophils Absolute 0.0 0.0 - 0.1 K/uL  Comprehensive metabolic panel     Status: Abnormal   Collection Time: 08/02/14  7:45 PM  Result Value Ref Range   Sodium 137 135 - 145 mmol/L   Potassium 3.7 3.5 - 5.1 mmol/L   Chloride 102 96 - 112 mmol/L   CO2 30 19 - 32 mmol/L   Glucose, Bld 97 70 - 99 mg/dL   BUN 13 6 - 23 mg/dL   Creatinine, Ser 0.99 0.50 - 1.35  mg/dL   Calcium 8.5 8.4 - 10.5 mg/dL   Total Protein 7.2 6.0 - 8.3 g/dL   Albumin 4.0 3.5 - 5.2 g/dL   AST 19 0 - 37 U/L   ALT 16 0 - 53 U/L   Alkaline Phosphatase 57 39 - 117 U/L   Total Bilirubin 1.6 (H) 0.3 - 1.2 mg/dL   GFR calc non Af Amer 82 (L) >90 mL/min   GFR calc Af Amer >90 >90 mL/min    Comment: (NOTE) The eGFR has been calculated using the CKD EPI equation. This calculation has not been validated in all clinical situations. eGFR's persistently <90 mL/min signify possible Chronic Kidney Disease.    Anion gap 5 5 - 15  Urinalysis, Routine w reflex microscopic     Status: None   Collection Time: 08/03/14  2:17 AM  Result Value Ref Range   Color, Urine YELLOW YELLOW   APPearance CLEAR CLEAR   Specific Gravity, Urine 1.016 1.005 - 1.030   pH 5.5 5.0 - 8.0   Glucose, UA NEGATIVE NEGATIVE mg/dL   Hgb urine dipstick NEGATIVE NEGATIVE   Bilirubin Urine NEGATIVE NEGATIVE   Ketones, ur NEGATIVE NEGATIVE mg/dL   Protein, ur NEGATIVE NEGATIVE mg/dL   Urobilinogen, UA 1.0 0.0 - 1.0 mg/dL   Nitrite NEGATIVE NEGATIVE   Leukocytes, UA NEGATIVE NEGATIVE    Comment: MICROSCOPIC NOT DONE ON URINES WITH NEGATIVE PROTEIN, BLOOD, LEUKOCYTES, NITRITE, OR GLUCOSE <1000 mg/dL.  Magnesium     Status: None   Collection Time: 08/03/14  5:20 AM  Result Value Ref Range   Magnesium 1.8 1.5 - 2.5 mg/dL  Phosphorus     Status: None   Collection Time: 08/03/14   5:20 AM  Result Value Ref Range   Phosphorus 2.9 2.3 - 4.6 mg/dL  TSH     Status: Abnormal   Collection Time: 08/03/14  5:20 AM  Result Value Ref Range   TSH 45.322 (H) 0.350 - 4.500 uIU/mL    Comment: Performed at [facility]  Comprehensive metabolic panel     Status: Abnormal   Collection Time: 08/03/14  5:20 AM  Result Value Ref Range   Sodium 141 135 - 145 mmol/L   Potassium 3.4 (L) 3.5 - 5.1 mmol/L   Chloride 102 96 - 112 mmol/L   CO2 33 (H) 19 - 32 mmol/L   Glucose, Bld 115 (H) 70 - 99 mg/dL   BUN 13 6 - 23 mg/dL   Creatinine, Ser 1.08 0.50 - 1.35 mg/dL   Calcium 8.7 8.4 - 10.5 mg/dL   Total Protein 6.5 6.0 - 8.3 g/dL   Albumin 3.6 3.5 - 5.2 g/dL   AST 13 0 - 37 U/L   ALT 14 0 - 53 U/L   Alkaline Phosphatase 51 39 - 117 U/L   Total Bilirubin 1.3 (H) 0.3 - 1.2 mg/dL   GFR calc non Af Amer 69 (L) >90 mL/min   GFR calc Af Amer 79 (L) >90 mL/min    Comment: (NOTE) The eGFR has been calculated using the CKD EPI equation. This calculation has not been validated in all clinical situations. eGFR's persistently <90 mL/min signify possible Chronic Kidney Disease.    Anion gap 6 5 - 15  CBC     Status: Abnormal   Collection Time: 08/03/14  5:20 AM  Result Value Ref Range   WBC 5.6 4.0 - 10.5 K/uL   RBC 4.62 4.22 - 5.81 MIL/uL   Hemoglobin 13.0 13.0 - 17.0 g/dL  HCT 39.2 39.0 - 52.0 %   MCV 84.8 78.0 - 100.0 fL   MCH 28.1 26.0 - 34.0 pg   MCHC 33.2 30.0 - 36.0 g/dL   RDW 14.0 11.5 - 15.5 %   Platelets 120 (L) 150 - 400 K/uL    US Scrotum  08/02/2014   CLINICAL DATA:  Acute onset of left testicular pain. Initial encounter.  EXAM: SCROTAL ULTRASOUND  DOPPLER ULTRASOUND OF THE TESTICLES  TECHNIQUE: Complete ultrasound examination of the testicles, epididymis, and other scrotal structures was performed. Color and spectral Doppler ultrasound were also utilized to evaluate blood flow to the testicles.  COMPARISON:  None.  FINDINGS: Right testicle  Measurements: 5.0 x  2.7 x 3.3 cm. No mass or microlithiasis visualized.  Left testicle  Measurements: 4.9 x 2.7 x 3.5 cm. No microlithiasis visualized. A 0.6 cm partially exophytic cyst is noted arising at the upper pole of the left testis.  Right epididymis:  Normal in size and appearance.  Left epididymis: Diffusely increased color Doppler blood flow noted with regard to the left epididymis.  Hydrocele:  A moderate left-sided hydrocele is noted.  Varicocele:  None visualized.  Pulsed Doppler interrogation of both testes demonstrates low resistance arterial and venous waveforms bilaterally.  IMPRESSION: 1. No evidence of testicular torsion. 2. Hyperemia with regard to the left epididymis raises concern for left-sided epididymitis. 3. Moderate left-sided hydrocele seen. 4. Small left testicular cyst noted.   Electronically Signed   By: Garald Balding M.D.   On: 08/02/2014 22:23   [name] Art/ven Flow Abd Pelv Doppler  08/02/2014   CLINICAL DATA:  Acute onset of left testicular pain. Initial encounter.  EXAM: SCROTAL ULTRASOUND  DOPPLER ULTRASOUND OF THE TESTICLES  TECHNIQUE: Complete ultrasound examination of the testicles, epididymis, and other scrotal structures was performed. Color and spectral Doppler ultrasound were also utilized to evaluate blood flow to the testicles.  COMPARISON:  None.  FINDINGS: Right testicle  Measurements: 5.0 x 2.7 x 3.3 cm. No mass or microlithiasis visualized.  Left testicle  Measurements: 4.9 x 2.7 x 3.5 cm. No microlithiasis visualized. A 0.6 cm partially exophytic cyst is noted arising at the upper pole of the left testis.  Right epididymis:  Normal in size and appearance.  Left epididymis: Diffusely increased color Doppler blood flow noted with regard to the left epididymis.  Hydrocele:  A moderate left-sided hydrocele is noted.  Varicocele:  None visualized.  Pulsed Doppler interrogation of both testes demonstrates low resistance arterial and venous waveforms bilaterally.  IMPRESSION: 1. No evidence  of testicular torsion. 2. Hyperemia with regard to the left epididymis raises concern for left-sided epididymitis. 3. Moderate left-sided hydrocele seen. 4. Small left testicular cyst noted.   Electronically Signed   By: Garald Balding M.D.   On: 08/02/2014 22:23     Disposition: Home  Diet: Resume as tolerated.  Activity: Resume as tolerated.   Follow-up Appts:     Discharge Instructions    Diet - low sodium heart healthy    Complete by:  As directed      Discharge instructions    Complete by:  As directed   Please followup with Miguel Aschoff, MD (PCP) in 1 week. Discuss with your PCP about abnormal TSH, please have free T4 checked by your PCP to determine if you levothyroxine dose needs to be increase. Please followup with Dr. Gaynelle Arabian (urology) in 1 week.     Increase activity slowly    Complete by:  As directed  TESTS THAT NEED FOLLOW-UP None  Time spent on discharge, talking to the patient, and coordinating care: 25 mins.   Signed: Bynum Bellows, MD 08/03/2014, 12:33 PM

## 2014-08-03 NOTE — Progress Notes (Signed)
ANTIBIOTIC CONSULT NOTE - INITIAL  Pharmacy Consult for Vancomycin Indication: Cellulitis  Allergies  Allergen Reactions  . Bee Venom Anaphylaxis  . Shrimp [Shellfish Allergy] Anaphylaxis    "just shrimp"  . Wasp Venom Anaphylaxis  . Dilaudid [Hydromorphone Hcl]     itching    Patient Measurements: Height: 6\' 7"  (200.7 cm) Weight: (!) 360 lb 14.4 oz (163.703 kg) IBW/kg (Calculated) : 93.7 Adjusted Body Weight:   Vital Signs: Temp: 98 F (36.7 C) (01/24 0104) Temp Source: Oral (01/24 0104) BP: 163/92 mmHg (01/24 0104) Pulse Rate: 67 (01/24 0104) Intake/Output from previous day: 01/23 0701 - 01/24 0700 In: -  Out: 200 [Urine:200] Intake/Output from this shift: Total I/O In: -  Out: 200 [Urine:200]  Labs:  Recent Labs  08/02/14 1945  WBC 6.6  HGB 13.7  PLT 152  CREATININE 0.99   Estimated Creatinine Clearance: 122.9 mL/min (by C-G formula based on Cr of 0.99). No results for input(s): VANCOTROUGH, VANCOPEAK, VANCORANDOM, GENTTROUGH, GENTPEAK, GENTRANDOM, TOBRATROUGH, TOBRAPEAK, TOBRARND, AMIKACINPEAK, AMIKACINTROU, AMIKACIN in the last 72 hours.   Microbiology: No results found for this or any previous visit (from the past 720 hour(s)).  Medical History: Past Medical History  Diagnosis Date  . Complication of anesthesia     Sometimes has N&V /w anesth.   Marland Kitchen PONV (postoperative nausea and vomiting)   . Hypertension   . Hypothyroidism   . Arthritis     low back - DDD  . History of chronic bronchitis   . Chronic lower back pain     "from [place] 2007"  . History of gout   . Hypocholesteremia   . Elevated PSA   . Myocardial infarction 2001    2001- cardiac cath., cardiac clearanece note dr Otho Perl 05-14-13 on chart, stress test results 02-21-12 on chart  . Pneumonia 2000's and 2013  . OSA on CPAP     cpap setting of 10  . Stroke 2004    "right brain stem; no residual "  . Coronary artery disease     Cath 2001  . [place] spotted fever      Medications:  Anti-infectives    Start     Dose/Rate Route Frequency Ordered Stop   08/03/14 1200  vancomycin (VANCOCIN) 1,500 mg in sodium chloride 0.9 % 500 mL IVPB     1,500 mg250 mL/hr over 120 Minutes Intravenous Every 12 hours 08/03/14 0504     08/03/14 0600  piperacillin-tazobactam (ZOSYN) IVPB 3.375 g     3.375 g12.5 mL/hr over 240 Minutes Intravenous 3 times per day 08/03/14 0103     08/03/14 0115  vancomycin (VANCOCIN) IVPB 1000 mg/200 mL premix     1,000 mg200 mL/hr over 60 Minutes Intravenous  Once 08/03/14 0113 08/03/14 0234   08/02/14 2315  vancomycin (VANCOCIN) IVPB 1000 mg/200 mL premix     1,000 mg200 mL/hr over 60 Minutes Intravenous  Once 08/02/14 2310 08/03/14 0124   08/02/14 2315  piperacillin-tazobactam (ZOSYN) IVPB 3.375 g     3.375 g12.5 mL/hr over 240 Minutes Intravenous  Once 08/02/14 2310 08/03/14 0027     Assessment: Patient with scrotal cellulitis.  First dose of antibiotics already given in ED.  Patient >140kg with good renal function.  Goal of Therapy:  Vancomycin trough level 10-15 mcg/ml  Plan:  Measure antibiotic drug levels at steady state Follow up culture results Total 2gm iv vancomycin for load, then 1500mg  iv q12hr  Nani Skillern Crowford 08/03/2014,5:05 AM

## 2014-08-04 ENCOUNTER — Inpatient Hospital Stay (HOSPITAL_COMMUNITY)
Admission: AD | Admit: 2014-08-04 | Discharge: 2014-08-07 | DRG: 728 | Disposition: A | Discharge status: Home / Self Care | Payer: Medicare Other | Source: Ambulatory Visit | Attending: Internal Medicine | Admitting: Internal Medicine

## 2014-08-04 ENCOUNTER — Encounter (HOSPITAL_COMMUNITY): Payer: Self-pay | Admitting: Radiology

## 2014-08-04 ENCOUNTER — Inpatient Hospital Stay (HOSPITAL_COMMUNITY): Payer: Medicare Other

## 2014-08-04 DIAGNOSIS — N3946 Mixed incontinence: Secondary | ICD-10-CM | POA: Diagnosis present

## 2014-08-04 DIAGNOSIS — N508 Other specified disorders of male genital organs: Secondary | ICD-10-CM | POA: Diagnosis not present

## 2014-08-04 DIAGNOSIS — N3289 Other specified disorders of bladder: Secondary | ICD-10-CM | POA: Diagnosis not present

## 2014-08-04 DIAGNOSIS — E876 Hypokalemia: Secondary | ICD-10-CM | POA: Diagnosis present

## 2014-08-04 DIAGNOSIS — I10 Essential (primary) hypertension: Secondary | ICD-10-CM | POA: Diagnosis present

## 2014-08-04 DIAGNOSIS — R946 Abnormal results of thyroid function studies: Secondary | ICD-10-CM | POA: Diagnosis present

## 2014-08-04 DIAGNOSIS — I252 Old myocardial infarction: Secondary | ICD-10-CM

## 2014-08-04 DIAGNOSIS — M79602 Pain in left arm: Secondary | ICD-10-CM | POA: Diagnosis present

## 2014-08-04 DIAGNOSIS — B356 Tinea cruris: Secondary | ICD-10-CM | POA: Diagnosis present

## 2014-08-04 DIAGNOSIS — N419 Inflammatory disease of prostate, unspecified: Secondary | ICD-10-CM | POA: Diagnosis present

## 2014-08-04 DIAGNOSIS — E039 Hypothyroidism, unspecified: Secondary | ICD-10-CM | POA: Diagnosis present

## 2014-08-04 DIAGNOSIS — N451 Epididymitis: Secondary | ICD-10-CM

## 2014-08-04 DIAGNOSIS — R0603 Acute respiratory distress: Secondary | ICD-10-CM

## 2014-08-04 DIAGNOSIS — Z96659 Presence of unspecified artificial knee joint: Secondary | ICD-10-CM | POA: Diagnosis present

## 2014-08-04 DIAGNOSIS — Z91030 Bee allergy status: Secondary | ICD-10-CM

## 2014-08-04 DIAGNOSIS — M109 Gout, unspecified: Secondary | ICD-10-CM | POA: Diagnosis present

## 2014-08-04 DIAGNOSIS — E780 Pure hypercholesterolemia: Secondary | ICD-10-CM | POA: Diagnosis present

## 2014-08-04 DIAGNOSIS — I517 Cardiomegaly: Secondary | ICD-10-CM | POA: Diagnosis not present

## 2014-08-04 DIAGNOSIS — R109 Unspecified abdominal pain: Secondary | ICD-10-CM | POA: Diagnosis not present

## 2014-08-04 DIAGNOSIS — M25519 Pain in unspecified shoulder: Secondary | ICD-10-CM | POA: Diagnosis present

## 2014-08-04 DIAGNOSIS — M1990 Unspecified osteoarthritis, unspecified site: Secondary | ICD-10-CM | POA: Diagnosis present

## 2014-08-04 DIAGNOSIS — G4733 Obstructive sleep apnea (adult) (pediatric): Secondary | ICD-10-CM | POA: Diagnosis present

## 2014-08-04 DIAGNOSIS — J811 Chronic pulmonary edema: Secondary | ICD-10-CM | POA: Diagnosis not present

## 2014-08-04 DIAGNOSIS — R7989 Other specified abnormal findings of blood chemistry: Secondary | ICD-10-CM | POA: Diagnosis present

## 2014-08-04 DIAGNOSIS — R51 Headache: Secondary | ICD-10-CM | POA: Diagnosis not present

## 2014-08-04 DIAGNOSIS — J90 Pleural effusion, not elsewhere classified: Secondary | ICD-10-CM | POA: Diagnosis not present

## 2014-08-04 DIAGNOSIS — N320 Bladder-neck obstruction: Secondary | ICD-10-CM | POA: Diagnosis present

## 2014-08-04 DIAGNOSIS — R202 Paresthesia of skin: Secondary | ICD-10-CM | POA: Diagnosis not present

## 2014-08-04 DIAGNOSIS — N200 Calculus of kidney: Secondary | ICD-10-CM

## 2014-08-04 DIAGNOSIS — J9811 Atelectasis: Secondary | ICD-10-CM | POA: Diagnosis not present

## 2014-08-04 DIAGNOSIS — Z8673 Personal history of transient ischemic attack (TIA), and cerebral infarction without residual deficits: Secondary | ICD-10-CM | POA: Diagnosis not present

## 2014-08-04 DIAGNOSIS — Z885 Allergy status to narcotic agent status: Secondary | ICD-10-CM

## 2014-08-04 DIAGNOSIS — I2510 Atherosclerotic heart disease of native coronary artery without angina pectoris: Secondary | ICD-10-CM | POA: Diagnosis present

## 2014-08-04 DIAGNOSIS — R200 Anesthesia of skin: Secondary | ICD-10-CM

## 2014-08-04 DIAGNOSIS — E0590 Thyrotoxicosis, unspecified without thyrotoxic crisis or storm: Secondary | ICD-10-CM | POA: Diagnosis present

## 2014-08-04 DIAGNOSIS — N492 Inflammatory disorders of scrotum: Secondary | ICD-10-CM | POA: Diagnosis not present

## 2014-08-04 HISTORY — DX: Epididymitis: N45.1

## 2014-08-04 LAB — COMPREHENSIVE METABOLIC PANEL
ALT: 13 U/L (ref 0–53)
AST: 13 U/L (ref 0–37)
Albumin: 3.8 g/dL (ref 3.5–5.2)
Alkaline Phosphatase: 51 U/L (ref 39–117)
Anion gap: 7 (ref 5–15)
BUN: 13 mg/dL (ref 6–23)
CO2: 30 mmol/L (ref 19–32)
Calcium: 8.9 mg/dL (ref 8.4–10.5)
Chloride: 101 mmol/L (ref 96–112)
Creatinine, Ser: 1.11 mg/dL (ref 0.50–1.35)
GFR calc Af Amer: 77 mL/min — ABNORMAL LOW (ref >90)
GFR calc non Af Amer: 66 mL/min — ABNORMAL LOW (ref >90)
Glucose, Bld: 124 mg/dL — ABNORMAL HIGH (ref 70–99)
Potassium: 2.9 mmol/L — ABNORMAL LOW (ref 3.5–5.1)
Sodium: 138 mmol/L (ref 135–145)
Total Bilirubin: 1.2 mg/dL (ref 0.3–1.2)
Total Protein: 6.7 g/dL (ref 6.0–8.3)

## 2014-08-04 LAB — CBC WITH DIFFERENTIAL/PLATELET
Basophils Absolute: 0 10*3/uL (ref 0.0–0.1)
Basophils Relative: 0 % (ref 0–1)
Eosinophils Absolute: 0.2 10*3/uL (ref 0.0–0.7)
Eosinophils Relative: 5 % (ref 0–5)
HCT: 39.4 % (ref 39.0–52.0)
Hemoglobin: 13.1 g/dL (ref 13.0–17.0)
Lymphocytes Relative: 22 % (ref 12–46)
Lymphs Abs: 1.2 10*3/uL (ref 0.7–4.0)
MCH: 28 pg (ref 26.0–34.0)
MCHC: 33.2 g/dL (ref 30.0–36.0)
MCV: 84.2 fL (ref 78.0–100.0)
Monocytes Absolute: 0.2 10*3/uL (ref 0.1–1.0)
Monocytes Relative: 5 % (ref 3–12)
Neutro Abs: 3.6 10*3/uL (ref 1.7–7.7)
Neutrophils Relative %: 68 % (ref 43–77)
Platelets: 149 10*3/uL — ABNORMAL LOW (ref 150–400)
RBC: 4.68 MIL/uL (ref 4.22–5.81)
RDW: 13.8 % (ref 11.5–15.5)
WBC: 5.3 10*3/uL (ref 4.0–10.5)

## 2014-08-04 MED ORDER — SODIUM CHLORIDE 0.45 % IV SOLN
INTRAVENOUS | Status: DC
  Administered 2014-08-04: 22:00:00 via INTRAVENOUS

## 2014-08-04 MED ORDER — DIPHENHYDRAMINE HCL 25 MG PO CAPS: 25.0000 mg | ORAL_CAPSULE | ORAL | Status: DC | PRN

## 2014-08-04 MED ORDER — DIPHENHYDRAMINE HCL 12.5 MG/5ML PO ELIX: 12.5000 mg | ORAL_SOLUTION | Freq: Four times a day (QID) | ORAL | Status: DC | PRN

## 2014-08-04 MED ORDER — IOHEXOL 300 MG/ML  SOLN
100.0000 mL | Freq: Once | INTRAMUSCULAR | Status: AC | PRN
  Administered 2014-08-04: 100 mL via INTRAVENOUS

## 2014-08-04 MED ORDER — ZOLPIDEM TARTRATE 5 MG PO TABS: 5.0000 mg | ORAL_TABLET | Freq: Every evening | ORAL | Status: DC | PRN

## 2014-08-04 MED ORDER — SENNA 8.6 MG PO TABS
1.0000 | ORAL_TABLET | Freq: Two times a day (BID) | ORAL | Status: DC
  Administered 2014-08-06 – 2014-08-07 (×2): 8.6 mg via ORAL
  Filled 2014-08-04 (×3): qty 1

## 2014-08-04 MED ORDER — ONDANSETRON HCL 4 MG/2ML IJ SOLN
4.0000 mg | INTRAMUSCULAR | Status: DC | PRN
  Administered 2014-08-05: 4 mg via INTRAVENOUS
  Filled 2014-08-04: qty 2

## 2014-08-04 MED ORDER — KETOROLAC TROMETHAMINE 15 MG/ML IJ SOLN
15.0000 mg | Freq: Four times a day (QID) | INTRAMUSCULAR | Status: DC | PRN
  Administered 2014-08-05 (×3): 15 mg via INTRAVENOUS
  Filled 2014-08-04 (×3): qty 1

## 2014-08-04 MED ORDER — DIPHENHYDRAMINE HCL 50 MG/ML IJ SOLN
12.5000 mg | Freq: Four times a day (QID) | INTRAMUSCULAR | Status: DC | PRN
Start: 2014-08-04 — End: 2014-08-07

## 2014-08-04 MED ORDER — BUTORPHANOL TARTRATE 2 MG/ML IJ SOLN
1.0000 mg | INTRAMUSCULAR | Status: DC | PRN
  Administered 2014-08-05: 1 mg via INTRAVENOUS
  Filled 2014-08-04: qty 0.5
  Filled 2014-08-04: qty 1

## 2014-08-04 MED ORDER — IOHEXOL 300 MG/ML  SOLN
50.0000 mL | Freq: Once | INTRAMUSCULAR | Status: AC | PRN
  Administered 2014-08-04: 50 mL via ORAL

## 2014-08-04 MED ORDER — HYOSCYAMINE SULFATE 0.125 MG SL SUBL
0.1250 mg | SUBLINGUAL_TABLET | Freq: Four times a day (QID) | SUBLINGUAL | Status: DC | PRN
  Filled 2014-08-04: qty 1

## 2014-08-04 MED ORDER — LEVOFLOXACIN 750 MG PO TABS
750.0000 mg | ORAL_TABLET | Freq: Every day | ORAL | Status: DC
  Administered 2014-08-04 – 2014-08-07 (×4): 750 mg via ORAL
  Filled 2014-08-04 (×4): qty 1
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
## 2014-08-04 NOTE — Progress Notes (Signed)
Recd pt as a direct admit. Condition stable. Pt with cellulitis of scrotum. Pt very plaseant. Wife is very rude and belligerent. She was upset that patient could not eat. Explained that pt is going to have a CT with contrast done and the pt could not have anything to eat of drink. She went to [employer] and purchased food and stated that she was going to feed him. Wife stating we were trying to kill him and she was going to report use. She was yelling and screaming at the nurses station. Limits set. Wife followed RN to the room and continued to yell and be rude and belligerent. Pt told wife to "Shut up now and let her talk". I spoke with the pt and explained the above. He was receptive to plan of care. Stated he would wait until testing was completed. Cont with plan of care.

## 2014-08-05 ENCOUNTER — Inpatient Hospital Stay (HOSPITAL_COMMUNITY): Payer: Medicare Other

## 2014-08-05 LAB — URINALYSIS, ROUTINE W REFLEX MICROSCOPIC
Bilirubin Urine: NEGATIVE
Glucose, UA: NEGATIVE mg/dL
Hgb urine dipstick: NEGATIVE
Ketones, ur: NEGATIVE mg/dL
Leukocytes, UA: NEGATIVE
Nitrite: NEGATIVE
Protein, ur: NEGATIVE mg/dL
Specific Gravity, Urine: 1.041 — ABNORMAL HIGH (ref 1.005–1.030)
Urobilinogen, UA: 2 mg/dL — ABNORMAL HIGH (ref 0.0–1.0)
pH: 7.5 (ref 5.0–8.0)

## 2014-08-05 LAB — BASIC METABOLIC PANEL
Anion gap: 8 (ref 5–15)
BUN: 16 mg/dL (ref 6–23)
CO2: 29 mmol/L (ref 19–32)
Calcium: 9.1 mg/dL (ref 8.4–10.5)
Chloride: 100 mmol/L (ref 96–112)
Creatinine, Ser: 1.12 mg/dL (ref 0.50–1.35)
GFR calc Af Amer: 76 mL/min — ABNORMAL LOW (ref >90)
GFR calc non Af Amer: 66 mL/min — ABNORMAL LOW (ref >90)
Glucose, Bld: 114 mg/dL — ABNORMAL HIGH (ref 70–99)
Potassium: 3.4 mmol/L — ABNORMAL LOW (ref 3.5–5.1)
Sodium: 137 mmol/L (ref 135–145)

## 2014-08-05 LAB — TROPONIN I
Troponin I: <0.03 ng/mL (ref <0.031)
Troponin I: <0.03 ng/mL (ref <0.031)

## 2014-08-05 LAB — TSH: TSH: 42.257 u[IU]/mL — ABNORMAL HIGH (ref 0.350–4.500)

## 2014-08-05 MED ORDER — ALLOPURINOL 100 MG PO TABS
100.0000 mg | ORAL_TABLET | Freq: Every day | ORAL | Status: DC
Start: 2014-08-05 — End: 2014-08-07
  Administered 2014-08-05 – 2014-08-07 (×3): 100 mg via ORAL
  Filled 2014-08-05 (×3): qty 1

## 2014-08-05 MED ORDER — ATORVASTATIN CALCIUM 20 MG PO TABS
20.0000 mg | ORAL_TABLET | Freq: Every day | ORAL | Status: DC
  Administered 2014-08-05 – 2014-08-07 (×3): 20 mg via ORAL
  Filled 2014-08-05 (×3): qty 1

## 2014-08-05 MED ORDER — LEVOTHYROXINE SODIUM 25 MCG PO TABS
25.0000 ug | ORAL_TABLET | Freq: Every day | ORAL | Status: DC
  Administered 2014-08-06: 25 ug via ORAL
  Filled 2014-08-05: qty 1

## 2014-08-05 MED ORDER — POTASSIUM CHLORIDE CRYS ER 20 MEQ PO TBCR
40.0000 meq | EXTENDED_RELEASE_TABLET | Freq: Once | ORAL | Status: AC
  Administered 2014-08-05: 40 meq via ORAL
  Filled 2014-08-05: qty 2

## 2014-08-05 MED ORDER — NALOXONE HCL 0.4 MG/ML IJ SOLN: 0.4000 mg | INTRAMUSCULAR | Status: DC | PRN

## 2014-08-05 MED ORDER — NALOXONE HCL 0.4 MG/ML IJ SOLN
INTRAMUSCULAR | Status: AC
  Administered 2014-08-05: 0.4 mg
  Filled 2014-08-05: qty 1

## 2014-08-05 MED ORDER — HYDRALAZINE HCL 20 MG/ML IJ SOLN
10.0000 mg | Freq: Four times a day (QID) | INTRAMUSCULAR | Status: DC | PRN
  Administered 2014-08-05 (×2): 10 mg via INTRAVENOUS
  Filled 2014-08-05 (×2): qty 1

## 2014-08-05 MED ORDER — SPIRONOLACTONE 25 MG PO TABS: 25.0000 mg | ORAL_TABLET | Freq: Every day | ORAL | Status: DC

## 2014-08-05 MED ORDER — SODIUM CHLORIDE 0.45 % IV SOLN
INTRAVENOUS | Status: DC
  Administered 2014-08-05: 05:00:00 via INTRAVENOUS
  Filled 2014-08-05 (×2): qty 1000

## 2014-08-05 MED ORDER — ASPIRIN 81 MG PO CHEW
81.0000 mg | CHEWABLE_TABLET | Freq: Every day | ORAL | Status: DC
  Administered 2014-08-05 – 2014-08-07 (×3): 81 mg via ORAL
  Filled 2014-08-05 (×3): qty 1

## 2014-08-05 MED ORDER — CARVEDILOL 12.5 MG PO TABS
12.5000 mg | ORAL_TABLET | Freq: Two times a day (BID) | ORAL | Status: DC
  Administered 2014-08-05 – 2014-08-07 (×5): 12.5 mg via ORAL
  Filled 2014-08-05: qty 2
  Filled 2014-08-05 (×4): qty 1

## 2014-08-05 MED ORDER — TORSEMIDE 20 MG PO TABS
20.0000 mg | ORAL_TABLET | Freq: Two times a day (BID) | ORAL | Status: DC
  Administered 2014-08-05 – 2014-08-07 (×5): 20 mg via ORAL
  Filled 2014-08-05 (×5): qty 1

## 2014-08-05 NOTE — Progress Notes (Signed)
Patient complains of pain from left side of neck extending down into the left shoulder, arm and hand. Stat EKG obtained.   MD paged.   BP 200/115 pulse 90  Patient has multiple BP meds that have not been restarted since admission. Asked MD's nurse about the meds and she stated that MD would consult a hospitalist to come and check on patient.   Will continue to monitor patient.    Patient stated that he has a history of Stroke and MI.

## 2014-08-05 NOTE — Progress Notes (Signed)
Subjective: Patient reports overnight improvement on Levaquin and Toradol..Remains AF, VSS.   Objective: Vital signs in last 24 hours: Temp:  [97.5 F (36.4 C)-98.5 F (36.9 C)] 97.7 F (36.5 C) (01/26 0423) Pulse Rate:  [58-70] 58 (01/26 0423) Resp:  [16-18] 18 (01/26 0423) BP: (154-187)/(92-110) 175/99 mmHg (01/26 0423) SpO2:  [96 %-100 %] 98 % (01/26 0423) Weight:  [163.295 kg (360 lb)] 163.295 kg (360 lb) (01/25 1644)  Intake/Output from previous day: 01/25 0701 - 01/26 0700 In: 600 [I.V.:600] Out: 800 [Urine:800] Intake/Output this shift:    Physical Exam:  General:alert, cooperative and moderately obese GI: not done and soft, non tender, normal bowel sounds, no palpable masses, no organomegaly, no inguinal hernia Male genitalia: not done Testicles: swelling: left, 50% inmproved overnight. Decreased tenderness.    Lab Results:  Recent Labs  08/02/14 1945 08/03/14 0520 08/04/14 2240  HGB 13.7 13.0 13.1  HCT 41.3 39.2 39.4   BMET  Recent Labs  08/04/14 2240 08/05/14 0645  NA 138 137  K 2.9* 3.4*  CL 101 100  CO2 30 29  GLUCOSE 124* 114*  BUN 13 16  CREATININE 1.11 1.12  CALCIUM 8.9 9.1   No results for input(s): LABPT, INR in the last 72 hours. No results for input(s): LABURIN in the last 72 hours. Results for orders placed or performed during the hospital encounter of 07/17/13  Surgical pcr screen     Status: None   Collection Time: 07/17/13 10:22 AM  Result Value Ref Range Status   MRSA, PCR NEGATIVE NEGATIVE Final   Staphylococcus aureus NEGATIVE NEGATIVE Final    Comment:        The Xpert SA Assay (FDA approved for NASAL specimens in patients over 35 years of age), is one component of a comprehensive surveillance program.  Test performance has been validated by [employer] for patients greater than or equal to 34 year old. It is not intended to diagnose infection nor to guide or monitor treatment.    Studies/Results: Ct Abdomen  Pelvis W Contrast  08/05/2014   CLINICAL DATA:  Left flank and testicular pain with scrotal swelling. History of transurethral bladder resection 05/2014. Chronic low back pain. Initial encounter.  EXAM: CT ABDOMEN AND PELVIS WITH CONTRAST  TECHNIQUE: Multidetector CT imaging of the abdomen and pelvis was performed using the standard protocol following bolus administration of intravenous contrast.  CONTRAST:  63mL OMNIPAQUE IOHEXOL 300 MG/ML SOLN, 110mL OMNIPAQUE IOHEXOL 300 MG/ML SOLN  COMPARISON:  Abdominal pelvic CT 01/27/2014. Testicular ultrasound 08/02/2014.  FINDINGS: Lower chest: Minimal basilar atelectasis. No pleural or pericardial effusion.  Hepatobiliary: The liver is normal in density without focal abnormality. No evidence of gallstones, gallbladder wall thickening or biliary dilatation.  Pancreas: Unremarkable. No pancreatic ductal dilatation or surrounding inflammatory changes.  Spleen: Normal in size without focal abnormality.  Adrenals/Urinary Tract: Both adrenal glands appear normal.There is a stable tiny nonobstructing calculus in the upper pole of the left kidney. No other urinary tract calculi are demonstrated. There is a stable sub cm probable cyst in the lower pole of the right kidney, best seen on image 50 of series 2. No other focal renal abnormalities demonstrated. There is no hydronephrosis or delay in contrast excretion. Generalized bladder wall thickening is similar to the prior examination. No focal lesion observed.  Stomach/Bowel: No evidence of bowel wall thickening, distention or surrounding inflammatory change.Retrocecal appendix appears normal. There is stool throughout the colon. No extraluminal fluid collections demonstrated.  Vascular/Lymphatic: Small lymph nodes  within the porta hepatis and upper retroperitoneum are stable. Small pelvic lymph nodes are also stable. There is stable atherosclerosis of the aorta and iliac arteries.  Reproductive: Central low density within the  prostate gland suggesting prior trans urethral resection. There is soft tissue edema in the left scrotum with a left-sided hydrocele.  Other: Mildly prominent fat within both inguinal canals is stable.  Musculoskeletal: No acute or significant osseous findings. There are grossly stable postsurgical changes status post L2-3 and L4-5 fusion.  IMPRESSION: 1. No evidence of ureteral calculus or hydronephrosis. Stable nonobstructing calculus in the upper pole of the left kidney. 2. Stable nonspecific generalized bladder wall thickening without apparent focal abnormality. Correlate clinically. 3. Left scrotal edema and hydrocele as demonstrated on preceding testicular ultrasound. 4. No other significant findings.   Electronically Signed   By: Camie Patience M.D.   On: 08/05/2014 08:04    Assessment/Plan: CT shows no additional abnormality. Pt difficult to examine, but had severe prostatitis on rectal exam yesterday with pain referred to the L testis. L testis and scrotum 50% improved today on Levaquin and IV Toradol . Considered orhiectomy, but will now continue with antibiotics. No evidence of L epedidymal abscess this AM.  P: contineu IV antibiotic and antiinflammatory for 24 hrs more and Rx Levaquin for 7 days, unless he develops L epididymal abscess.    LOS: 1 day   [place] I 08/05/2014, 8:54 AM

## 2014-08-05 NOTE — Progress Notes (Addendum)
Pt feeling nauseated at this time.  RT will hold off on placing patient's home cpap at this time.  Rn will call when pt feeling better and able to wear.

## 2014-08-05 NOTE — Progress Notes (Signed)
Pt received Stadol 1mg  IV at 2046. Within 2-4 minutes pt started feeling dizzy and flush and repeatedly  yelling oh no I'm dizzy. At 2102 pt became unresponsive with respiration of 4-6, BP 200/115, HR 73, oxygen saturation 100% RA. RRT called and Tylene Fantasia, NP with [facility] paged. Pt had episode of emesis. Vevelyn Royals, NP returned paged and advised me to call primary doctor because [facility] was only consulting on pt.  Once RRT arrived to room,  Narcan given at 2114 and again at 2124. Pt slow to respond, but able to answer all question appropriately. Clemetine Marker, NP with [facility] notified of above events. Pt wife Tylyn Derwin notified of event. Will continue to monitor.

## 2014-08-05 NOTE — Care Management Note (Signed)
    Page 1 of 1   08/05/2014     11:07:37 AM CARE MANAGEMENT NOTE 08/05/2014  Patient:  Dennis [name], Dennis [name]   Account Number:  [id number]  Date Initiated:  08/05/2014  Documentation initiated by:  Dessa Phi  Subjective/Objective Assessment:   69 y/o m admitted w/L epididymitis.Readmit 1/23-1/24/16-L epididymitis.     Action/Plan:   From home.   Anticipated DC Date:  08/08/2014   Anticipated DC Plan:  [place]  CM consult      Choice offered to / List presented to:             Status of service:  In process, will continue to follow Medicare Important Message given?   (If response is "NO", the following Medicare IM given date fields will be blank) Date Medicare IM given:   Medicare IM given by:   Date Additional Medicare IM given:   Additional Medicare IM given by:    Discharge Disposition:    Per UR Regulation:  Reviewed for med. necessity/level of care/duration of stay  If discussed at [place] of Stay Meetings, dates discussed:    Comments:  08/05/14 Dessa Phi RN BSN NCM [phone] Monitor progress & d/c needs.

## 2014-08-05 NOTE — Progress Notes (Signed)
RT placed pt on CPAP at 10cmH2O per home settings via nasal mask on room air. Sterile water was added for humidification. Pt states he is comfortable and is tolerating CPAP well at this time. RT will continue to monitor as needed.
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
## 2014-08-06 DIAGNOSIS — N451 Epididymitis: Principal | ICD-10-CM

## 2014-08-06 DIAGNOSIS — R7989 Other specified abnormal findings of blood chemistry: Secondary | ICD-10-CM

## 2014-08-06 DIAGNOSIS — I10 Essential (primary) hypertension: Secondary | ICD-10-CM

## 2014-08-06 LAB — BASIC METABOLIC PANEL
Anion gap: 6 (ref 5–15)
BUN: 16 mg/dL (ref 6–23)
CO2: 29 mmol/L (ref 19–32)
Calcium: 9.2 mg/dL (ref 8.4–10.5)
Chloride: 100 mmol/L (ref 96–112)
Creatinine, Ser: 1.24 mg/dL (ref 0.50–1.35)
GFR calc Af Amer: 67 mL/min — ABNORMAL LOW (ref >90)
GFR calc non Af Amer: 58 mL/min — ABNORMAL LOW (ref >90)
Glucose, Bld: 118 mg/dL — ABNORMAL HIGH (ref 70–99)
Potassium: 3.6 mmol/L (ref 3.5–5.1)
Sodium: 135 mmol/L (ref 135–145)

## 2014-08-06 LAB — T4, FREE: Free T4: 0.66 ng/dL — ABNORMAL LOW (ref 0.80–1.80)

## 2014-08-06 LAB — TROPONIN I: Troponin I: <0.03 ng/mL (ref <0.031)

## 2014-08-06 MED ORDER — NYSTATIN 100000 UNIT/GM EX POWD
Freq: Two times a day (BID) | CUTANEOUS | Status: DC
  Administered 2014-08-06 – 2014-08-07 (×3): via TOPICAL
  Filled 2014-08-06 (×2): qty 15

## 2014-08-06 MED ORDER — LEVOTHYROXINE SODIUM 200 MCG PO TABS
200.0000 ug | ORAL_TABLET | Freq: Every day | ORAL | Status: DC
  Administered 2014-08-07: 200 ug via ORAL
  Filled 2014-08-06: qty 1

## 2014-08-06 MED ORDER — SPIRONOLACTONE 100 MG PO TABS
100.0000 mg | ORAL_TABLET | Freq: Every day | ORAL | Status: DC
  Administered 2014-08-07: 100 mg via ORAL
  Filled 2014-08-06: qty 1

## 2014-08-06 MED ORDER — FLUCONAZOLE 150 MG PO TABS
150.0000 mg | ORAL_TABLET | Freq: Every day | ORAL | Status: DC
  Administered 2014-08-06 – 2014-08-07 (×2): 150 mg via ORAL
  Filled 2014-08-06 (×2): qty 1

## 2014-08-06 MED ORDER — OXYCODONE-ACETAMINOPHEN 5-325 MG PO TABS: 1.0000 | ORAL_TABLET | ORAL | Status: DC | PRN

## 2014-08-06 MED ORDER — OXYCODONE-ACETAMINOPHEN 5-325 MG PO TABS
1.0000 | ORAL_TABLET | Freq: Four times a day (QID) | ORAL | Status: DC | PRN
  Filled 2014-08-06: qty 1

## 2014-08-06 MED ORDER — POTASSIUM CHLORIDE CRYS ER 20 MEQ PO TBCR
40.0000 meq | EXTENDED_RELEASE_TABLET | Freq: Once | ORAL | Status: AC
  Administered 2014-08-06: 40 meq via ORAL
  Filled 2014-08-06: qty 2

## 2014-08-06 MED ORDER — LOSARTAN POTASSIUM 50 MG PO TABS
100.0000 mg | ORAL_TABLET | Freq: Every day | ORAL | Status: DC
  Administered 2014-08-06 – 2014-08-07 (×2): 100 mg via ORAL
  Filled 2014-08-06 (×2): qty 2

## 2014-08-06 MED ORDER — SACCHAROMYCES BOULARDII 250 MG PO CAPS
250.0000 mg | ORAL_CAPSULE | Freq: Two times a day (BID) | ORAL | Status: DC
  Administered 2014-08-06 – 2014-08-07 (×2): 250 mg via ORAL
  Filled 2014-08-06 (×4): qty 1

## 2014-08-06 MED ORDER — SPIRONOLACTONE 25 MG PO TABS
25.0000 mg | ORAL_TABLET | Freq: Every day | ORAL | Status: DC
  Administered 2014-08-06: 25 mg via ORAL
  Filled 2014-08-06: qty 1

## 2014-08-06 NOTE — Progress Notes (Signed)
Subjective: 1. L epididymitis: on Levaquin Iv. Scrotum is much better ( now 75%  Better. ) no erythema. Decreased tenderness. Decreased edema. A: much improved on Levaquin. Will be able to weitch to oral Levaquin at discharge to finish course. 2. Hypertention: Pt. Was admitted over weekend  And discharged over weekend, but not restarted on BP meds. Readmitted Monday for pain and infection, but BP meds not re-ordered. Elevated BP noted Tuesday afternoon, and pt seen by  Medicine, and BP med restarted.  3. Codeine "allergy" Although pt takes ?percocet at home for Orthopedic pain. He was given Stadol/benedryl last night , but develop-ed respiratory depression, and ? Aspiration. CXR neg for aspiration,  But pt is anxious, and wife anxious this AM. WE will await Medicine evaluation this AM.  .  Objective: Vital signs in last 24 hours: Temp:  [97.8 F (36.6 C)-97.9 F (36.6 C)] 97.8 F (36.6 C) (01/27 0618) Pulse Rate:  [62-73] 62 (01/27 0618) Resp:  [4-22] 16 (01/27 0618) BP: (136-208)/(89-122) 136/91 mmHg (01/27 0618) SpO2:  [93 %-100 %] 99 % (01/27 0618)  Intake/Output from previous day: 01/26 0701 - 01/27 0700 In: 480 [P.O.:480] Out: 1050 [Urine:1050] Intake/Output this shift:    Physical Exam:  General:alert, cooperative and fatigued. C/o weakness from respiratory depression from pain med from last night.  GI: not done Scrotum: Swelling much improved, with reduced edema, and much reduced tenderness.   Lab Results:  Recent Labs  08/04/14 2240  HGB 13.1  HCT 39.4   BMET  Recent Labs  08/04/14 2240 08/05/14 0645  NA 138 137  K 2.9* 3.4*  CL 101 100  CO2 30 29  GLUCOSE 124* 114*  BUN 13 16  CREATININE 1.11 1.12  CALCIUM 8.9 9.1   No results for input(s): LABPT, INR in the last 72 hours. No results for input(s): LABURIN in the last 72 hours. Results for orders placed or performed during the hospital encounter of 07/17/13  Surgical pcr screen     Status: None   Collection Time: 07/17/13 10:22 AM  Result Value Ref Range Status   MRSA, PCR NEGATIVE NEGATIVE Final   Staphylococcus aureus NEGATIVE NEGATIVE Final    Comment:        The Xpert SA Assay (FDA approved for NASAL specimens in patients over 24 years of age), is one component of a comprehensive surveillance program.  Test performance has been validated by [employer] for patients greater than or equal to 89 year old. It is not intended to diagnose infection nor to guide or monitor treatment.    Studies/Results: Ct Head Wo Contrast  08/05/2014   CLINICAL DATA:  Left facial tingling and headaches  EXAM: CT HEAD WITHOUT CONTRAST  TECHNIQUE: Contiguous axial images were obtained from the base of the skull through the vertex without intravenous contrast.  COMPARISON:  12/29/2013  FINDINGS: The bony calvarium is intact. 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 A lacunar infarct is again noted in the head of left caudate nucleus. Diffuse atrophic changes are seen. Mild chronic white matter ischemic change is again seen. No findings to suggest acute hemorrhage, acute infarction or space-occupying mass lesion are noted.  IMPRESSION: Chronic changes without acute abnormality. No significant change from the prior exam.   Electronically Signed   By: Inez Catalina M.D.   On: 08/05/2014 20:25   Ct Abdomen Pelvis W Contrast  08/05/2014   CLINICAL DATA:  Left flank and testicular pain with scrotal swelling. History of transurethral bladder  resection 05/2014. Chronic low back pain. Initial encounter.  EXAM: CT ABDOMEN AND PELVIS WITH CONTRAST  TECHNIQUE: Multidetector CT imaging of the abdomen and pelvis was performed using the standard protocol following bolus administration of intravenous contrast.  CONTRAST:  41mL OMNIPAQUE IOHEXOL 300 MG/ML SOLN, 132mL OMNIPAQUE IOHEXOL 300 MG/ML SOLN  COMPARISON:  Abdominal pelvic CT 01/27/2014. Testicular ultrasound 08/02/2014.  FINDINGS: Lower chest: Minimal  basilar atelectasis. No pleural or pericardial effusion.  Hepatobiliary: The liver is normal in density without focal abnormality. No evidence of gallstones, gallbladder wall thickening or biliary dilatation.  Pancreas: Unremarkable. No pancreatic ductal dilatation or surrounding inflammatory changes.  Spleen: Normal in size without focal abnormality.  Adrenals/Urinary Tract: Both adrenal glands appear normal.There is a stable tiny nonobstructing calculus in the upper pole of the left kidney. No other urinary tract calculi are demonstrated. There is a stable sub cm probable cyst in the lower pole of the right kidney, best seen on image 50 of series 2. No other focal renal abnormalities demonstrated. There is no hydronephrosis or delay in contrast excretion. Generalized bladder wall thickening is similar to the prior examination. No focal lesion observed.  Stomach/Bowel: No evidence of bowel wall thickening, distention or surrounding inflammatory change.Retrocecal appendix appears normal. There is stool throughout the colon. No extraluminal fluid collections demonstrated.  Vascular/Lymphatic: Small lymph nodes within the porta hepatis and upper retroperitoneum are stable. Small pelvic lymph nodes are also stable. There is stable atherosclerosis of the aorta and iliac arteries.  Reproductive: Central low density within the prostate gland suggesting prior trans urethral resection. There is soft tissue edema in the left scrotum with a left-sided hydrocele.  Other: Mildly prominent fat within both inguinal canals is stable.  Musculoskeletal: No acute or significant osseous findings. There are grossly stable postsurgical changes status post L2-3 and L4-5 fusion.  IMPRESSION: 1. No evidence of ureteral calculus or hydronephrosis. Stable nonobstructing calculus in the upper pole of the left kidney. 2. Stable nonspecific generalized bladder wall thickening without apparent focal abnormality. Correlate clinically. 3. Left  scrotal edema and hydrocele as demonstrated on preceding testicular ultrasound. 4. No other significant findings.   Electronically Signed   By: Camie Patience M.D.   On: 08/05/2014 08:04   Dg Chest Port 1 View  08/05/2014   CLINICAL DATA:  Vomiting and hypertension. Question of aspiration. Initial encounter.  EXAM: PORTABLE CHEST - 1 VIEW  COMPARISON:  Chest radiograph performed 05/23/2013  FINDINGS: The lungs are hypoexpanded. Vascular congestion is noted, with mild bilateral atelectasis. A small right pleural effusion is seen. No pneumothorax is identified.  The cardiomediastinal silhouette is mildly enlarged. No acute osseous abnormalities are seen.  IMPRESSION: Lungs hypoexpanded. Vascular congestion and mild cardiomegaly, with mild bilateral atelectasis. Small right pleural effusion seen.   Electronically Signed   By: Garald Balding M.D.   On: 08/05/2014 21:46    Assessment/Plan: Continue ABX therapy due to epididymitis/prostatitis.  Respiratory depression 2ndary Stadol Rx for pain.  Will await medicine evaluation this AM and then consider D/c planning for today or tomorrow.    LOS: 2 days   Hershy Flenner I 08/06/2014, 9:27 AM

## 2014-08-06 NOTE — Progress Notes (Signed)
Pt is able to place himself on home CPAP when ready for bed. Pt encouraged to call RT if needing any assistance. No distress noted at this time.

## 2014-08-06 NOTE — Progress Notes (Signed)
[facility] PROGRESS NOTE  Dennis [name] Sr. RJJ:[id number] DOB: [birth year] DOA: 08/04/2014 PCP: Miguel Aschoff, MD  Assessment/Plan: Active Problems:   Hypertension   CAD (coronary artery disease)   Abnormal TSH   Epididymitis, left      1-Severe HTN; will resume home BP medications. Continue, coreg, restart Aldactone and torsemide. Will order PRN hydralazine. Restart cozaar off deformity and reconcile the home med list. Monitor renal function on diuretics.   2.Left arm pain, numbness; patient very sensitive to narcotics, received narcan last evening, therefore avoid narcotics for pain, he has taken Percocet/Soma in the past for musculoskeletal pain, his cardiac enzymes are negative, CT of the head is negative, neurochecks have been stable, doubt that the patient has had a stroke. Patient agreeable to restarting Percocet as needed  3-Hypothyroidism;  Resume synthroid. TSH markedly elevated. Pharmacy to clarify home dose of levothyroxine  4-Epididymitis: Continue with Levaquin. Discussed with Dr. Gaynelle Arabian. Patient needs levofloxacin 10 days.  5-Hypokalemia; replete with 40 meq KCL. Recheck BMP  6-History of stroke; resume statin and aspirin.   7- Tinea cruris -will start patient on fluconazole 3 days and nystatin  Code Status: full Family Communication: family updated about patient's clinical progress Disposition Plan:  Patient has been transferred to hospitalist service . Anticipate discharge tomorrow   Brief narrative: 69 year old with PMH significant for CAD, HTN, hypothyroidism, CVA who was admitted 1-25 by urology service for treatment of left epididymitis. He has been improving with IV antibiotics and pain management. Patient today with elevated BP, in the 200/115 range. He is complaining of left arm pain, shoulder pain. He also relates numbness left hand. He had another episode of left hand tingling yesterday. He denies chest pain, or dyspnea. He relates that he  has been taking his synthroid ?Marland Kitchen Denies abdominal pain.   Consultants:  Dr. Gaynelle Arabian  Procedures:  None  Antibiotics: Fluconazole Levaquin  HPI/Subjective: Very dissatisfied with care since admission, then 45 minutes listening to patient's sequence of events since admission. Wife also had multiple complaints. I addressed all of their complaints and does seem to be satisfied now  Objective: Filed Vitals:   08/05/14 2143 08/05/14 2158 08/05/14 2229 08/06/14 0618  BP: 173/103 156/94 148/89 136/91  Pulse: 69 69 66 62  Temp:    97.8 F (36.6 C)  TempSrc:    Oral  Resp: 22 20 22 16   Height:      Weight:      SpO2: 93% 96% 98% 99%    Intake/Output Summary (Last 24 hours) at 08/06/14 1105 Last data filed at 08/06/14 0354  Gross per 24 hour  Intake    240 ml  Output   1050 ml  Net   -810 ml    Exam:  General: alert & oriented x 3 In NAD  Cardiovascular: RRR, nl S1 s2  Respiratory: Decreased breath sounds at the bases, scattered rhonchi, no crackles  Abdomen: soft +BS NT/ND, no masses palpable  Extremities: No cyanosis and no edema GU, scrotal swelling has improved patient does have erythema in the groin area      Data Reviewed: Basic Metabolic Panel:  Recent Labs Lab 08/02/14 1945 08/03/14 0520 08/04/14 2240 08/05/14 0645  NA 137 141 138 137  K 3.7 3.4* 2.9* 3.4*  CL 102 102 101 100  CO2 30 33* 30 29  GLUCOSE 97 115* 124* 114*  BUN 13 13 13 16   CREATININE 0.99 1.08 1.11 1.12  CALCIUM 8.5 8.7 8.9 9.1  MG  --  1.8  --   --   PHOS  --  2.9  --   --     Liver Function Tests:  Recent Labs Lab 08/02/14 1945 08/03/14 0520 08/04/14 2240  AST 19 13 13   ALT 16 14 13   ALKPHOS 57 51 51  BILITOT 1.6* 1.3* 1.2  PROT 7.2 6.5 6.7  ALBUMIN 4.0 3.6 3.8   No results for input(s): LIPASE, AMYLASE in the last 168 hours. No results for input(s): AMMONIA in the last 168 hours.  CBC:  Recent Labs Lab 08/02/14 1945 08/03/14 0520 08/04/14 2240  WBC 6.6  5.6 5.3  NEUTROABS 4.6  --  3.6  HGB 13.7 13.0 13.1  HCT 41.3 39.2 39.4  MCV 85.0 84.8 84.2  PLT 152 120* 149*    Cardiac Enzymes:  Recent Labs Lab 08/05/14 1755 08/05/14 2300 08/06/14 0520  TROPONINI <0.03 <0.03 <0.03   BNP (last 3 results) No results for input(s): PROBNP in the last 8760 hours.   CBG: No results for input(s): GLUCAP in the last 168 hours.  No results found for this or any previous visit (from the past 240 hour(s)).   Studies: Ct Head Wo Contrast  08/05/2014   CLINICAL DATA:  Left facial tingling and headaches  EXAM: CT HEAD WITHOUT CONTRAST  TECHNIQUE: Contiguous axial images were obtained from the base of the skull through the vertex without intravenous contrast.  COMPARISON:  12/29/2013  FINDINGS: The bony calvarium is intact. 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 A lacunar infarct is again noted in the head of left caudate nucleus. Diffuse atrophic changes are seen. Mild chronic white matter ischemic change is again seen. No findings to suggest acute hemorrhage, acute infarction or space-occupying mass lesion are noted.  IMPRESSION: Chronic changes without acute abnormality. No significant change from the prior exam.   Electronically Signed   By: Inez Catalina M.D.   On: 08/05/2014 20:25   US Scrotum  08/02/2014   CLINICAL DATA:  Acute onset of left testicular pain. Initial encounter.  EXAM: SCROTAL ULTRASOUND  DOPPLER ULTRASOUND OF THE TESTICLES  TECHNIQUE: Complete ultrasound examination of the testicles, epididymis, and other scrotal structures was performed. Color and spectral Doppler ultrasound were also utilized to evaluate blood flow to the testicles.  COMPARISON:  None.  FINDINGS: Right testicle  Measurements: 5.0 x 2.7 x 3.3 cm. No mass or microlithiasis visualized.  Left testicle  Measurements: 4.9 x 2.7 x 3.5 cm. No microlithiasis visualized. A 0.6 cm partially exophytic cyst is noted arising at the upper pole of the left testis.  Right  epididymis:  Normal in size and appearance.  Left epididymis: Diffusely increased color Doppler blood flow noted with regard to the left epididymis.  Hydrocele:  A moderate left-sided hydrocele is noted.  Varicocele:  None visualized.  Pulsed Doppler interrogation of both testes demonstrates low resistance arterial and venous waveforms bilaterally.  IMPRESSION: 1. No evidence of testicular torsion. 2. Hyperemia with regard to the left epididymis raises concern for left-sided epididymitis. 3. Moderate left-sided hydrocele seen. 4. Small left testicular cyst noted.   Electronically Signed   By: Garald Balding M.D.   On: 08/02/2014 22:23   Ct Abdomen Pelvis W Contrast  08/05/2014   CLINICAL DATA:  Left flank and testicular pain with scrotal swelling. History of transurethral bladder resection 05/2014. Chronic low back pain. Initial encounter.  EXAM: CT ABDOMEN AND PELVIS WITH CONTRAST  TECHNIQUE: Multidetector CT imaging of the abdomen and pelvis was performed using  the standard protocol following bolus administration of intravenous contrast.  CONTRAST:  34mL OMNIPAQUE IOHEXOL 300 MG/ML SOLN, 120mL OMNIPAQUE IOHEXOL 300 MG/ML SOLN  COMPARISON:  Abdominal pelvic CT 01/27/2014. Testicular ultrasound 08/02/2014.  FINDINGS: Lower chest: Minimal basilar atelectasis. No pleural or pericardial effusion.  Hepatobiliary: The liver is normal in density without focal abnormality. No evidence of gallstones, gallbladder wall thickening or biliary dilatation.  Pancreas: Unremarkable. No pancreatic ductal dilatation or surrounding inflammatory changes.  Spleen: Normal in size without focal abnormality.  Adrenals/Urinary Tract: Both adrenal glands appear normal.There is a stable tiny nonobstructing calculus in the upper pole of the left kidney. No other urinary tract calculi are demonstrated. There is a stable sub cm probable cyst in the lower pole of the right kidney, best seen on image 50 of series 2. No other focal renal  abnormalities demonstrated. There is no hydronephrosis or delay in contrast excretion. Generalized bladder wall thickening is similar to the prior examination. No focal lesion observed.  Stomach/Bowel: No evidence of bowel wall thickening, distention or surrounding inflammatory change.Retrocecal appendix appears normal. There is stool throughout the colon. No extraluminal fluid collections demonstrated.  Vascular/Lymphatic: Small lymph nodes within the porta hepatis and upper retroperitoneum are stable. Small pelvic lymph nodes are also stable. There is stable atherosclerosis of the aorta and iliac arteries.  Reproductive: Central low density within the prostate gland suggesting prior trans urethral resection. There is soft tissue edema in the left scrotum with a left-sided hydrocele.  Other: Mildly prominent fat within both inguinal canals is stable.  Musculoskeletal: No acute or significant osseous findings. There are grossly stable postsurgical changes status post L2-3 and L4-5 fusion.  IMPRESSION: 1. No evidence of ureteral calculus or hydronephrosis. Stable nonobstructing calculus in the upper pole of the left kidney. 2. Stable nonspecific generalized bladder wall thickening without apparent focal abnormality. Correlate clinically. 3. Left scrotal edema and hydrocele as demonstrated on preceding testicular ultrasound. 4. No other significant findings.   Electronically Signed   By: Camie Patience M.D.   On: 08/05/2014 08:04   [name] Art/ven Flow Abd Pelv Doppler  08/02/2014   CLINICAL DATA:  Acute onset of left testicular pain. Initial encounter.  EXAM: SCROTAL ULTRASOUND  DOPPLER ULTRASOUND OF THE TESTICLES  TECHNIQUE: Complete ultrasound examination of the testicles, epididymis, and other scrotal structures was performed. Color and spectral Doppler ultrasound were also utilized to evaluate blood flow to the testicles.  COMPARISON:  None.  FINDINGS: Right testicle  Measurements: 5.0 x 2.7 x 3.3 cm. No mass or  microlithiasis visualized.  Left testicle  Measurements: 4.9 x 2.7 x 3.5 cm. No microlithiasis visualized. A 0.6 cm partially exophytic cyst is noted arising at the upper pole of the left testis.  Right epididymis:  Normal in size and appearance.  Left epididymis: Diffusely increased color Doppler blood flow noted with regard to the left epididymis.  Hydrocele:  A moderate left-sided hydrocele is noted.  Varicocele:  None visualized.  Pulsed Doppler interrogation of both testes demonstrates low resistance arterial and venous waveforms bilaterally.  IMPRESSION: 1. No evidence of testicular torsion. 2. Hyperemia with regard to the left epididymis raises concern for left-sided epididymitis. 3. Moderate left-sided hydrocele seen. 4. Small left testicular cyst noted.   Electronically Signed   By: Garald Balding M.D.   On: 08/02/2014 22:23   Dg Chest Port 1 View  08/05/2014   CLINICAL DATA:  Vomiting and hypertension. Question of aspiration. Initial encounter.  EXAM: PORTABLE CHEST - 1 VIEW  COMPARISON:  Chest radiograph performed 05/23/2013  FINDINGS: The lungs are hypoexpanded. Vascular congestion is noted, with mild bilateral atelectasis. A small right pleural effusion is seen. No pneumothorax is identified.  The cardiomediastinal silhouette is mildly enlarged. No acute osseous abnormalities are seen.  IMPRESSION: Lungs hypoexpanded. Vascular congestion and mild cardiomegaly, with mild bilateral atelectasis. Small right pleural effusion seen.   Electronically Signed   By: Garald Balding M.D.   On: 08/05/2014 21:46    Scheduled Meds: . allopurinol  100 mg Oral Daily  . aspirin  81 mg Oral Daily  . atorvastatin  20 mg Oral Daily  . carvedilol  12.5 mg Oral BID WC  . fluconazole  150 mg Oral Daily  . levofloxacin  750 mg Oral Daily  . levothyroxine  25 mcg Oral QAC breakfast  . nystatin   Topical BID  . saccharomyces boulardii  250 mg Oral BID  . senna  1 tablet Oral BID  . spironolactone  25 mg Oral  Daily  . torsemide  20 mg Oral BID   Continuous Infusions:   Active Problems:   Hypertension   CAD (coronary artery disease)   Abnormal TSH   Epididymitis, left    Time spent: 40 minutes   [facility] Pager [phone]. If 7PM-7AM, please contact night-coverage at [email or web address], password [email or web address] 08/06/2014, 11:05 AM  LOS: 2 days

## 2014-08-07 LAB — COMPREHENSIVE METABOLIC PANEL
ALT: 14 U/L (ref 0–53)
AST: 14 U/L (ref 0–37)
Albumin: 4 g/dL (ref 3.5–5.2)
Alkaline Phosphatase: 55 U/L (ref 39–117)
Anion gap: 11 (ref 5–15)
BUN: 21 mg/dL (ref 6–23)
CO2: 28 mmol/L (ref 19–32)
Calcium: 10.2 mg/dL (ref 8.4–10.5)
Chloride: 100 mmol/L (ref 96–112)
Creatinine, Ser: 1.37 mg/dL — ABNORMAL HIGH (ref 0.50–1.35)
GFR calc Af Amer: 60 mL/min — ABNORMAL LOW (ref >90)
GFR calc non Af Amer: 51 mL/min — ABNORMAL LOW (ref >90)
Glucose, Bld: 116 mg/dL — ABNORMAL HIGH (ref 70–99)
Potassium: 3.5 mmol/L (ref 3.5–5.1)
Sodium: 139 mmol/L (ref 135–145)
Total Bilirubin: 1.1 mg/dL (ref 0.3–1.2)
Total Protein: 7.1 g/dL (ref 6.0–8.3)

## 2014-08-07 LAB — CBC
HCT: 43.2 % (ref 39.0–52.0)
Hemoglobin: 14.3 g/dL (ref 13.0–17.0)
MCH: 28.1 pg (ref 26.0–34.0)
MCHC: 33.1 g/dL (ref 30.0–36.0)
MCV: 84.9 fL (ref 78.0–100.0)
Platelets: 197 10*3/uL (ref 150–400)
RBC: 5.09 MIL/uL (ref 4.22–5.81)
RDW: 14.1 % (ref 11.5–15.5)
WBC: 6.9 10*3/uL (ref 4.0–10.5)

## 2014-08-07 LAB — T3, FREE: T3, Free: 1.9 pg/mL — ABNORMAL LOW (ref 2.0–4.4)

## 2014-08-07 MED ORDER — LEVOFLOXACIN 750 MG PO TABS: 750.0000 mg | ORAL_TABLET | Freq: Every day | ORAL | Status: DC

## 2014-08-07 MED ORDER — NYSTATIN 100000 UNIT/GM EX POWD: CUTANEOUS | Status: DC

## 2014-08-07 MED ORDER — FLUCONAZOLE 150 MG PO TABS: 150.0000 mg | ORAL_TABLET | Freq: Every day | ORAL | Status: DC

## 2014-08-07 MED ORDER — SACCHAROMYCES BOULARDII 250 MG PO CAPS: 250.0000 mg | ORAL_CAPSULE | Freq: Two times a day (BID) | ORAL | Status: DC

## 2014-08-07 MED ORDER — ASPIRIN 81 MG PO CHEW: 81.0000 mg | CHEWABLE_TABLET | Freq: Every day | ORAL | Status: DC

## 2014-08-07 MED ORDER — HYOSCYAMINE SULFATE 0.125 MG SL SUBL: 0.1250 mg | SUBLINGUAL_TABLET | Freq: Four times a day (QID) | SUBLINGUAL | Status: DC | PRN

## 2014-08-07 NOTE — Discharge Summary (Signed)
Physician Discharge Summary  Dennis Piety Chaudhari Sr. MRN: [id number] DOB/AGE: [birth year] 69 y.o.  PCP: Miguel Aschoff, MD   Admit date: 08/04/2014 Discharge date: 08/07/2014  Discharge Diagnoses:      Hypertension   CAD (coronary artery disease)   Abnormal TSH   Epididymitis, left Tinea cruris   Follow-up recommendations  follow-up with PCP in 5-7 days     Medication List    STOP taking these medications        ALEVE PM PO     sulfamethoxazole-trimethoprim 800-160 MG per tablet  Commonly known as:  BACTRIM DS,SEPTRA DS      TAKE these medications        allopurinol 100 MG tablet  Commonly known as:  ZYLOPRIM  Take 100 mg by mouth daily.     aspirin 81 MG chewable tablet  Chew 1 tablet (81 mg total) by mouth daily.     atorvastatin 20 MG tablet  Commonly known as:  LIPITOR  Take 20 mg by mouth daily.     carvedilol 12.5 MG tablet  Commonly known as:  COREG  Take 1 tablet (12.5 mg total) by mouth 2 (two) times daily.     EPIPEN 0.3 mg/0.3 mL Soaj injection  Generic drug:  EPINEPHrine  Inject 0.3 mg into the muscle as needed (allergic reaction).     escitalopram 20 MG tablet  Commonly known as:  LEXAPRO  Take 20 mg by mouth daily.     fluconazole 150 MG tablet  Commonly known as:  DIFLUCAN  Take 1 tablet (150 mg total) by mouth daily.     HYDROcodone-acetaminophen 10-325 MG per tablet  Commonly known as:  NORCO  Take 1 tablet by mouth as needed for severe pain.     hyoscyamine 0.125 MG SL tablet  Commonly known as:  LEVSIN SL  Take 1 tablet (0.125 mg total) by mouth every 6 (six) hours as needed for cramping.     levofloxacin 750 MG tablet  Commonly known as:  LEVAQUIN  Take 1 tablet (750 mg total) by mouth daily.     levothyroxine 200 MCG tablet  Commonly known as:  SYNTHROID, LEVOTHROID  Take 200 mcg by mouth daily before breakfast.     losartan 100 MG tablet  Commonly known as:  COZAAR  Take 1 tablet (100 mg total) by mouth daily.      nystatin 100000 UNIT/GM Powd  Applied to the groin area twice a day     potassium chloride SA 20 MEQ tablet  Commonly known as:  K-DUR,KLOR-CON  Take 1 tablet (20 mEq total) by mouth daily.     saccharomyces boulardii 250 MG capsule  Commonly known as:  FLORASTOR  Take 1 capsule (250 mg total) by mouth 2 (two) times daily.     spironolactone 100 MG tablet  Commonly known as:  ALDACTONE  Take 100 mg by mouth daily.     torsemide 20 MG tablet  Commonly known as:  DEMADEX  Take 20 mg by mouth 2 (two) times daily.        Discharge Condition:    Disposition: 01-Home or Self Care   Consults: Urology  Significant Diagnostic Studies: Ct Head Wo Contrast  08/05/2014   CLINICAL DATA:  Left facial tingling and headaches  EXAM: CT HEAD WITHOUT CONTRAST  TECHNIQUE: Contiguous axial images were obtained from the base of the skull through the vertex without intravenous contrast.  COMPARISON:  12/29/2013  FINDINGS: The bony calvarium is intact.  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 A lacunar infarct is again noted in the head of left caudate nucleus. Diffuse atrophic changes are seen. Mild chronic white matter ischemic change is again seen. No findings to suggest acute hemorrhage, acute infarction or space-occupying mass lesion are noted.  IMPRESSION: Chronic changes without acute abnormality. No significant change from the prior exam.   Electronically Signed   By: Inez Catalina M.D.   On: 08/05/2014 20:25   US Scrotum  08/02/2014   CLINICAL DATA:  Acute onset of left testicular pain. Initial encounter.  EXAM: SCROTAL ULTRASOUND  DOPPLER ULTRASOUND OF THE TESTICLES  TECHNIQUE: Complete ultrasound examination of the testicles, epididymis, and other scrotal structures was performed. Color and spectral Doppler ultrasound were also utilized to evaluate blood flow to the testicles.  COMPARISON:  None.  FINDINGS: Right testicle  Measurements: 5.0 x 2.7 x 3.3 cm. No mass or microlithiasis  visualized.  Left testicle  Measurements: 4.9 x 2.7 x 3.5 cm. No microlithiasis visualized. A 0.6 cm partially exophytic cyst is noted arising at the upper pole of the left testis.  Right epididymis:  Normal in size and appearance.  Left epididymis: Diffusely increased color Doppler blood flow noted with regard to the left epididymis.  Hydrocele:  A moderate left-sided hydrocele is noted.  Varicocele:  None visualized.  Pulsed Doppler interrogation of both testes demonstrates low resistance arterial and venous waveforms bilaterally.  IMPRESSION: 1. No evidence of testicular torsion. 2. Hyperemia with regard to the left epididymis raises concern for left-sided epididymitis. 3. Moderate left-sided hydrocele seen. 4. Small left testicular cyst noted.   Electronically Signed   By: Garald Balding M.D.   On: 08/02/2014 22:23   Ct Abdomen Pelvis W Contrast  08/05/2014   CLINICAL DATA:  Left flank and testicular pain with scrotal swelling. History of transurethral bladder resection 05/2014. Chronic low back pain. Initial encounter.  EXAM: CT ABDOMEN AND PELVIS WITH CONTRAST  TECHNIQUE: Multidetector CT imaging of the abdomen and pelvis was performed using the standard protocol following bolus administration of intravenous contrast.  CONTRAST:  60m OMNIPAQUE IOHEXOL 300 MG/ML SOLN, 1011mOMNIPAQUE IOHEXOL 300 MG/ML SOLN  COMPARISON:  Abdominal pelvic CT 01/27/2014. Testicular ultrasound 08/02/2014.  FINDINGS: Lower chest: Minimal basilar atelectasis. No pleural or pericardial effusion.  Hepatobiliary: The liver is normal in density without focal abnormality. No evidence of gallstones, gallbladder wall thickening or biliary dilatation.  Pancreas: Unremarkable. No pancreatic ductal dilatation or surrounding inflammatory changes.  Spleen: Normal in size without focal abnormality.  Adrenals/Urinary Tract: Both adrenal glands appear normal.There is a stable tiny nonobstructing calculus in the upper pole of the left kidney. No  other urinary tract calculi are demonstrated. There is a stable sub cm probable cyst in the lower pole of the right kidney, best seen on image 50 of series 2. No other focal renal abnormalities demonstrated. There is no hydronephrosis or delay in contrast excretion. Generalized bladder wall thickening is similar to the prior examination. No focal lesion observed.  Stomach/Bowel: No evidence of bowel wall thickening, distention or surrounding inflammatory change.Retrocecal appendix appears normal. There is stool throughout the colon. No extraluminal fluid collections demonstrated.  Vascular/Lymphatic: Small lymph nodes within the porta hepatis and upper retroperitoneum are stable. Small pelvic lymph nodes are also stable. There is stable atherosclerosis of the aorta and iliac arteries.  Reproductive: Central low density within the prostate gland suggesting prior trans urethral resection. There is soft tissue edema in the left scrotum with a  left-sided hydrocele.  Other: Mildly prominent fat within both inguinal canals is stable.  Musculoskeletal: No acute or significant osseous findings. There are grossly stable postsurgical changes status post L2-3 and L4-5 fusion.  IMPRESSION: 1. No evidence of ureteral calculus or hydronephrosis. Stable nonobstructing calculus in the upper pole of the left kidney. 2. Stable nonspecific generalized bladder wall thickening without apparent focal abnormality. Correlate clinically. 3. Left scrotal edema and hydrocele as demonstrated on preceding testicular ultrasound. 4. No other significant findings.   Electronically Signed   By: Camie Patience M.D.   On: 08/05/2014 08:04   [name] Art/ven Flow Abd Pelv Doppler  08/02/2014   CLINICAL DATA:  Acute onset of left testicular pain. Initial encounter.  EXAM: SCROTAL ULTRASOUND  DOPPLER ULTRASOUND OF THE TESTICLES  TECHNIQUE: Complete ultrasound examination of the testicles, epididymis, and other scrotal structures was performed. Color and  spectral Doppler ultrasound were also utilized to evaluate blood flow to the testicles.  COMPARISON:  None.  FINDINGS: Right testicle  Measurements: 5.0 x 2.7 x 3.3 cm. No mass or microlithiasis visualized.  Left testicle  Measurements: 4.9 x 2.7 x 3.5 cm. No microlithiasis visualized. A 0.6 cm partially exophytic cyst is noted arising at the upper pole of the left testis.  Right epididymis:  Normal in size and appearance.  Left epididymis: Diffusely increased color Doppler blood flow noted with regard to the left epididymis.  Hydrocele:  A moderate left-sided hydrocele is noted.  Varicocele:  None visualized.  Pulsed Doppler interrogation of both testes demonstrates low resistance arterial and venous waveforms bilaterally.  IMPRESSION: 1. No evidence of testicular torsion. 2. Hyperemia with regard to the left epididymis raises concern for left-sided epididymitis. 3. Moderate left-sided hydrocele seen. 4. Small left testicular cyst noted.   Electronically Signed   By: Garald Balding M.D.   On: 08/02/2014 22:23   Dg Chest Port 1 View  08/05/2014   CLINICAL DATA:  Vomiting and hypertension. Question of aspiration. Initial encounter.  EXAM: PORTABLE CHEST - 1 VIEW  COMPARISON:  Chest radiograph performed 05/23/2013  FINDINGS: The lungs are hypoexpanded. Vascular congestion is noted, with mild bilateral atelectasis. A small right pleural effusion is seen. No pneumothorax is identified.  The cardiomediastinal silhouette is mildly enlarged. No acute osseous abnormalities are seen.  IMPRESSION: Lungs hypoexpanded. Vascular congestion and mild cardiomegaly, with mild bilateral atelectasis. Small right pleural effusion seen.   Electronically Signed   By: Garald Balding M.D.   On: 08/05/2014 21:46      Microbiology: No results found for this or any previous visit (from the past 240 hour(s)).   Labs: Results for orders placed or performed during the hospital encounter of 08/04/14 (from the past 48 hour(s))   Troponin I (q 6hr x 3)     Status: None   Collection Time: 08/05/14  5:55 PM  Result Value Ref Range   Troponin I <0.03 <0.031 ng/mL    Comment:        NO INDICATION OF MYOCARDIAL INJURY.   TSH     Status: Abnormal   Collection Time: 08/05/14  5:55 PM  Result Value Ref Range   TSH 42.257 (H) 0.350 - 4.500 uIU/mL    Comment: Performed at [facility]  T3, free     Status: Abnormal   Collection Time: 08/05/14  5:55 PM  Result Value Ref Range   T3, Free 1.9 (L) 2.0 - 4.4 pg/mL    Comment: (NOTE) Performed At: [facility] [address]  [address] [place], [place] [id number] Lindon Romp MD NK:[id number]   T4, free     Status: Abnormal   Collection Time: 08/05/14  5:55 PM  Result Value Ref Range   Free T4 0.66 (L) 0.80 - 1.80 ng/dL    Comment: Performed at [facility]  Troponin I (q 6hr x 3)     Status: None   Collection Time: 08/05/14 11:00 PM  Result Value Ref Range   Troponin I <0.03 <0.031 ng/mL    Comment:        NO INDICATION OF MYOCARDIAL INJURY.   Troponin I (q 6hr x 3)     Status: None   Collection Time: 08/06/14  5:20 AM  Result Value Ref Range   Troponin I <0.03 <0.031 ng/mL    Comment:        NO INDICATION OF MYOCARDIAL INJURY.   Basic metabolic panel     Status: Abnormal   Collection Time: 08/06/14  5:20 AM  Result Value Ref Range   Sodium 135 135 - 145 mmol/L   Potassium 3.6 3.5 - 5.1 mmol/L   Chloride 100 96 - 112 mmol/L   CO2 29 19 - 32 mmol/L   Glucose, Bld 118 (H) 70 - 99 mg/dL   BUN 16 6 - 23 mg/dL   Creatinine, Ser 1.24 0.50 - 1.35 mg/dL   Calcium 9.2 8.4 - 10.5 mg/dL   GFR calc non Af Amer 58 (L) >90 mL/min   GFR calc Af Amer 67 (L) >90 mL/min    Comment: (NOTE) The eGFR has been calculated using the CKD EPI equation. This calculation has not been validated in all clinical situations. eGFR's persistently <90 mL/min signify possible Chronic Kidney Disease.    Anion gap 6 5 - 15  Comprehensive metabolic panel      Status: Abnormal   Collection Time: 08/07/14  5:32 AM  Result Value Ref Range   Sodium 139 135 - 145 mmol/L   Potassium 3.5 3.5 - 5.1 mmol/L   Chloride 100 96 - 112 mmol/L   CO2 28 19 - 32 mmol/L   Glucose, Bld 116 (H) 70 - 99 mg/dL   BUN 21 6 - 23 mg/dL   Creatinine, Ser 1.37 (H) 0.50 - 1.35 mg/dL   Calcium 10.2 8.4 - 10.5 mg/dL   Total Protein 7.1 6.0 - 8.3 g/dL   Albumin 4.0 3.5 - 5.2 g/dL   AST 14 0 - 37 U/L   ALT 14 0 - 53 U/L   Alkaline Phosphatase 55 39 - 117 U/L   Total Bilirubin 1.1 0.3 - 1.2 mg/dL   GFR calc non Af Amer 51 (L) >90 mL/min   GFR calc Af Amer 60 (L) >90 mL/min    Comment: (NOTE) The eGFR has been calculated using the CKD EPI equation. This calculation has not been validated in all clinical situations. eGFR's persistently <90 mL/min signify possible Chronic Kidney Disease.    Anion gap 11 5 - 15  CBC     Status: None   Collection Time: 08/07/14  5:32 AM  Result Value Ref Range   WBC 6.9 4.0 - 10.5 K/uL   RBC 5.09 4.22 - 5.81 MIL/uL   Hemoglobin 14.3 13.0 - 17.0 g/dL   HCT 43.2 39.0 - 52.0 %   MCV 84.9 78.0 - 100.0 fL   MCH 28.1 26.0 - 34.0 pg   MCHC 33.1 30.0 - 36.0 g/dL   RDW 14.1 11.5 - 15.5 %   Platelets 197  150 - 400 K/uL     HPI :*69 yo admitted by urology Dr. Gaynelle Arabian for discomfort from scrotal cellulitis & pain.   He has had burning, jolting pain in the Left scrotum since Friday night. Pain radiates up to the L flank. He was seen in [facility] ED and admitted for IV antibiotics and pain control, and Urology consult per Dr. Alinda Money, with dx epididymitis. He was discharged on Sunday, but pain has worsened overnight, feeling as if L testis is being "squeezed". He had u/s at [place] showing hydrocele, but no tortion, and ? epididymitis.    He states that scrotum has been bright red, swollen, has shooting pains, and burning sensation radiating from Lt testicle to Lt flank area. He was admitted over the weekend for scrotal swelling & pain. Dr.  Alinda Money was consulted to see him for Lt epididymitis. He was treated with IV Vancomycin & Zosyn. He was discharged on Septra DS.    Hx of BNC, incomplete emptying, incontinence, & ED. He states that he was in the hospital in Sept 2015 because of B/P issues (now off all meds). He was also told that he had a UTI, then he returned to the hospital told he did not have a UTI.  He was recently discharged from [facility] and will be seen by Cardiology for peripheral edema. He was found to have klebsiella UTI, Rx Septra, and now is voiding better than at any time in the last year. His IPSS= 4 only, with nocturia x 2, and occasional frequency and urgency.     He is s/p cysto/gyrus button vaporization of [place] on 05/30/13. He is having urgency, frequency & urge incontinence. He is s/p back surgery on 07/19/13. He feels that he is voiding better. His urine was "free-flowing", but with some urgency. Nocturia is decreased to x 3 vs 5-7. He is c/o spraying of his stream, and deviation of his stream. Stream has some dribbling, with urinary frequency, voiding in small amounts and extended voiding time.      He is status post open prostatectomy for recurrent prostatitis and bladder outlet obstruction in [place] in 2005. He had post op urinary incontinence, mixed stress and urge type, and leaking without awareness. He has failed anticholinergic medication. (Vesicare). Not taking Myrbetriq 50 mg.  Dennis [name] had 7 UTI's in 1 year. He was on Septra for suppression. He had a cysto in August 2014 that showed a moderate urethral stricture. His meatal stricture was dilated.   HOSPITAL COURSE: Transferred from urology to hospitalist service   1-Severe HTN; improved after resuming home BP medications. Continue, coreg, restart Aldactone and torsemide. Continue cozaar . Monitor renal function, BMP in one week  2.Left arm pain, numbness; patient very sensitive to narcotics, had to be resuscitated with Narcan  because of dizziness and near syncope, therefore avoid narcotics for pain, he has taken Percocet/Soma in the past for musculoskeletal pain, his cardiac enzymes are negative, CT of the head is negative, neurochecks have been stable, doubt that the patient has had a stroke. Patient agreeable to restarting Percocet as needed. He understands that he needs to avoid Aleve and NSAIDs given that he is on diuretics and ACE inhibitor.  3-Hypothyroidism;  Resume synthroid. TSH markedly elevated. Continue current dose of levothyroxine  4-Epididymitis: Continue with Levaquin. For 10 days. Discussed with Dr. Gaynelle Arabian. Patient to follow-up with the urologist of his choice within the next one week  5-Hypokalemia; replete with 40 meq KCL. Recheck BMP in  one week  6-History of stroke; resume statin and aspirin.   7- Tinea cruris -continue patient on fluconazole 3 days and nystatin   Discharge Exam: Blood pressure 110/67, pulse 65, temperature 97.9 F (36.6 C), temperature source Oral, resp. rate 20, height 6' 7"  (2.007 m), weight 163.295 kg (360 lb), SpO2 98 %.   General: alert & oriented x 3 In NAD   Cardiovascular: RRR, nl S1 s2   Respiratory: Decreased breath sounds at the bases, scattered rhonchi, no crackles   Abdomen: soft +BS NT/ND, no masses palpable   Extremities: No cyanosis and no edema  GU, scrotal swelling has improved patient does have erythema in the groin area       Discharge Instructions    Diet - low sodium heart healthy    Complete by:  As directed      Increase activity slowly    Complete by:  As directed            Follow-up Information    Follow up with Urology. Schedule an appointment as soon as possible for a visit in 1 week.      Follow up with Miguel Aschoff, MD. Schedule an appointment as soon as possible for a visit in 1 week.   Specialty:  Family Medicine   Contact information:   [address] [place] [id number] [phone]        Signed: Reyne Dumas 08/07/2014, 3:00 PM

## 2014-08-07 NOTE — Progress Notes (Signed)
Subjective: Patient reports improvement in L epididymal pain, but still present. General medical problems better.   Objective: Vital signs in last 24 hours: Temp:  [97.8 F (36.6 C)-98.6 F (37 C)] 97.8 F (36.6 C) (01/28 0422) Pulse Rate:  [59-68] 68 (01/28 0422) Resp:  [18-20] 18 (01/28 0422) BP: (124-155)/(77-91) 155/91 mmHg (01/28 0422) SpO2:  [98 %-99 %] 98 % (01/28 0422)  Intake/Output from previous day: 01/27 0701 - 01/28 0700 In: 720 [P.O.:720] Out: 1275 [Urine:1275] Intake/Output this shift: Total I/O In: 600 [P.O.:600] Out: -   Physical Exam:  General:WDWM in NAD.   Male genitalia: not done Testicles: swelling: left and tender: left improved.    Lab Results:  Recent Labs  08/04/14 2240 08/07/14 0532  HGB 13.1 14.3  HCT 39.4 43.2   BMET  Recent Labs  08/06/14 0520 08/07/14 0532  NA 135 139  K 3.6 3.5  CL 100 100  CO2 29 28  GLUCOSE 118* 116*  BUN 16 21  CREATININE 1.24 1.37*  CALCIUM 9.2 10.2   No results for input(s): LABPT, INR in the last 72 hours. No results for input(s): LABURIN in the last 72 hours. Results for orders placed or performed during the hospital encounter of 07/17/13  Surgical pcr screen     Status: None   Collection Time: 07/17/13 10:22 AM  Result Value Ref Range Status   MRSA, PCR NEGATIVE NEGATIVE Final   Staphylococcus aureus NEGATIVE NEGATIVE Final    Comment:        The Xpert SA Assay (FDA approved for NASAL specimens in patients over 41 years of age), is one component of a comprehensive surveillance program.  Test performance has been validated by [employer] for patients greater than or equal to 40 year old. It is not intended to diagnose infection nor to guide or monitor treatment.    Studies/Results: Ct Head Wo Contrast  08/05/2014   CLINICAL DATA:  Left facial tingling and headaches  EXAM: CT HEAD WITHOUT CONTRAST  TECHNIQUE: Contiguous axial images were obtained from the base of the skull through  the vertex without intravenous contrast.  COMPARISON:  12/29/2013  FINDINGS: The bony calvarium is intact. 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 A lacunar infarct is again noted in the head of left caudate nucleus. Diffuse atrophic changes are seen. Mild chronic white matter ischemic change is again seen. No findings to suggest acute hemorrhage, acute infarction or space-occupying mass lesion are noted.  IMPRESSION: Chronic changes without acute abnormality. No significant change from the prior exam.   Electronically Signed   By: Inez Catalina M.D.   On: 08/05/2014 20:25   Dg Chest Port 1 View  08/05/2014   CLINICAL DATA:  Vomiting and hypertension. Question of aspiration. Initial encounter.  EXAM: PORTABLE CHEST - 1 VIEW  COMPARISON:  Chest radiograph performed 05/23/2013  FINDINGS: The lungs are hypoexpanded. Vascular congestion is noted, with mild bilateral atelectasis. A small right pleural effusion is seen. No pneumothorax is identified.  The cardiomediastinal silhouette is mildly enlarged. No acute osseous abnormalities are seen.  IMPRESSION: Lungs hypoexpanded. Vascular congestion and mild cardiomegaly, with mild bilateral atelectasis. Small right pleural effusion seen.   Electronically Signed   By: Garald Balding M.D.   On: 08/05/2014 21:46    Assessment/Plan: L epididymitis: improving on Levaquin 750mg /day. Will need full 7-10 day  course.  Discussed with pt: he will have hard testis for 3 weeks after infection is gone.  Discussed BP meds, etc: will  defer to IM.  Pt will be returning to Dr. Thomasene Mohair in [facility] post hospitalization for Urologic follow-up.  Advise heat to L testis to help resolve his ependymitis.   LOS: 3 days   Kiyra Slaubaugh I 08/07/2014, 10:47 AM

## 2014-08-14 DIAGNOSIS — I2510 Atherosclerotic heart disease of native coronary artery without angina pectoris: Secondary | ICD-10-CM | POA: Diagnosis not present

## 2014-08-14 DIAGNOSIS — E669 Obesity, unspecified: Secondary | ICD-10-CM | POA: Diagnosis not present

## 2014-08-14 DIAGNOSIS — R530 Neoplastic (malignant) related fatigue: Secondary | ICD-10-CM | POA: Diagnosis not present

## 2014-08-14 DIAGNOSIS — F329 Major depressive disorder, single episode, unspecified: Secondary | ICD-10-CM | POA: Diagnosis not present

## 2014-08-18 ENCOUNTER — Telehealth: Payer: Self-pay | Admitting: Cardiovascular Disease

## 2014-08-18 NOTE — Telephone Encounter (Signed)
Spoke with pt. He reports recent hospitalization at [facility].  Respiratory arrest in hospital after receiving pain medication. States blood pressure was elevated and also low during hospitalization. Meds were held and adjusted. He continues to feel weak and washed out since hospital discharge. Saw primary care last Thursday and blood pressure was 110/60. Primary care told him it could take 6 months for him to recover from hospital events.  Pt is concerned about blood pressure being lower than normal and he reports feeling poorly when blood pressure low.  Reports readings of 110-112/58.  Heart rate 50-65.  Taking Coreg, aldactone and cozaar as listed. Appt made for pt to see Richardson Dopp, PA tomorrow at 9:00

## 2014-08-18 NOTE — Telephone Encounter (Signed)
New message      Pt c/o BP issue: STAT if pt c/o blurred vision, one-sided weakness or slurred speech  1. What are your last 5 BP readings?110/60  2. Are you having any other symptoms (ex. Dizziness, headache, blurred vision, passed out)? Light headed, weak and tired 3. What is your BP issue? Concerned that his bp is low Pt was released from hosp last friday

## 2014-08-19 ENCOUNTER — Ambulatory Visit (INDEPENDENT_AMBULATORY_CARE_PROVIDER_SITE_OTHER): Payer: Medicare Other | Admitting: Physician Assistant

## 2014-08-19 ENCOUNTER — Telehealth: Payer: Self-pay | Admitting: *Deleted

## 2014-08-19 ENCOUNTER — Encounter: Payer: Self-pay | Admitting: Physician Assistant

## 2014-08-19 VITALS — BP 140/91 | HR 62 | Ht 79.0 in | Wt 357.0 lb

## 2014-08-19 DIAGNOSIS — E039 Hypothyroidism, unspecified: Secondary | ICD-10-CM | POA: Diagnosis not present

## 2014-08-19 DIAGNOSIS — R42 Dizziness and giddiness: Secondary | ICD-10-CM | POA: Diagnosis not present

## 2014-08-19 DIAGNOSIS — I10 Essential (primary) hypertension: Secondary | ICD-10-CM

## 2014-08-19 DIAGNOSIS — M79602 Pain in left arm: Secondary | ICD-10-CM | POA: Diagnosis not present

## 2014-08-19 DIAGNOSIS — I2510 Atherosclerotic heart disease of native coronary artery without angina pectoris: Secondary | ICD-10-CM | POA: Diagnosis not present

## 2014-08-19 DIAGNOSIS — R600 Localized edema: Secondary | ICD-10-CM

## 2014-08-19 DIAGNOSIS — E785 Hyperlipidemia, unspecified: Secondary | ICD-10-CM

## 2014-08-19 DIAGNOSIS — I25119 Atherosclerotic heart disease of native coronary artery with unspecified angina pectoris: Secondary | ICD-10-CM

## 2014-08-19 LAB — BASIC METABOLIC PANEL
BUN: 19 mg/dL (ref 6–23)
CO2: 32 mEq/L (ref 19–32)
Calcium: 9.6 mg/dL (ref 8.4–10.5)
Chloride: 103 mEq/L (ref 96–112)
Creatinine, Ser: 1.18 mg/dL (ref 0.40–1.50)
GFR: 65.08 mL/min (ref >60.00)
Glucose, Bld: 79 mg/dL (ref 70–99)
Potassium: 4.2 mEq/L (ref 3.5–5.1)
Sodium: 138 mEq/L (ref 135–145)

## 2014-08-19 NOTE — Telephone Encounter (Signed)
thanks
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
## 2014-08-19 NOTE — Telephone Encounter (Signed)
pt notified about lab results with verbal understanding  

## 2014-08-19 NOTE — Patient Instructions (Signed)
1. DECREASE DEMADEX TO 1 TABLET DAILY FOR 1 WEEK THEN GO BACK TO REGULAR DOSE  2. DECREASE SPIRONOLACTONE TO 50 MG DAILY FOR 1 WEEK THEN GO BACK TO REGULAR DOSE  3. HOLD LOSARTAN 08/20/14 4. RESUME LOSARTAN ON 08/21/14  KEEP YOUR APPT WITH [name] ON 08/29/14  LAB WORK TODAY; BMET  MONITOR YOUR WEIGHT DAILY AND IF CALL IF WT IS UP 3-5 LB'S IN 1 WEEK OR MORE SWELLING OR NO CHANGE IN [place]; [phone]

## 2014-08-22 DIAGNOSIS — M5440 Lumbago with sciatica, unspecified side: Secondary | ICD-10-CM | POA: Diagnosis not present

## 2014-08-22 DIAGNOSIS — Z6839 Body mass index (BMI) 39.0-39.9, adult: Secondary | ICD-10-CM | POA: Diagnosis not present

## 2014-08-22 DIAGNOSIS — S32009K Unspecified fracture of unspecified lumbar vertebra, subsequent encounter for fracture with nonunion: Secondary | ICD-10-CM | POA: Diagnosis not present

## 2014-08-29 ENCOUNTER — Encounter: Payer: Self-pay | Admitting: Cardiovascular Disease

## 2014-08-29 ENCOUNTER — Ambulatory Visit (INDEPENDENT_AMBULATORY_CARE_PROVIDER_SITE_OTHER): Payer: Medicare Other | Admitting: Cardiovascular Disease

## 2014-08-29 VITALS — BP 134/78 | HR 57 | Ht 79.0 in | Wt 359.0 lb

## 2014-08-29 DIAGNOSIS — E785 Hyperlipidemia, unspecified: Secondary | ICD-10-CM | POA: Diagnosis not present

## 2014-08-29 DIAGNOSIS — I5032 Chronic diastolic (congestive) heart failure: Secondary | ICD-10-CM | POA: Diagnosis not present

## 2014-08-29 DIAGNOSIS — I2510 Atherosclerotic heart disease of native coronary artery without angina pectoris: Secondary | ICD-10-CM

## 2014-08-29 DIAGNOSIS — I10 Essential (primary) hypertension: Secondary | ICD-10-CM | POA: Diagnosis not present
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
## 2014-08-29 NOTE — Patient Instructions (Addendum)
Your physician recommends that you schedule a follow-up appointment in:  3 months. --Scheduled for Dec 02, 2014 at 3:00

## 2014-09-03 DIAGNOSIS — N401 Enlarged prostate with lower urinary tract symptoms: Secondary | ICD-10-CM | POA: Diagnosis not present

## 2014-09-03 DIAGNOSIS — N390 Urinary tract infection, site not specified: Secondary | ICD-10-CM | POA: Diagnosis not present

## 2014-09-09 DIAGNOSIS — N401 Enlarged prostate with lower urinary tract symptoms: Secondary | ICD-10-CM | POA: Diagnosis not present

## 2014-09-17 DIAGNOSIS — M7062 Trochanteric bursitis, left hip: Secondary | ICD-10-CM | POA: Diagnosis not present

## 2014-09-17 DIAGNOSIS — M25552 Pain in left hip: Secondary | ICD-10-CM | POA: Diagnosis not present

## 2014-09-26 DIAGNOSIS — N401 Enlarged prostate with lower urinary tract symptoms: Secondary | ICD-10-CM

## 2014-09-26 DIAGNOSIS — R350 Frequency of micturition: Secondary | ICD-10-CM | POA: Diagnosis not present

## 2014-09-26 DIAGNOSIS — R300 Dysuria: Secondary | ICD-10-CM | POA: Diagnosis not present

## 2014-09-26 DIAGNOSIS — N508 Other specified disorders of male genital organs: Secondary | ICD-10-CM | POA: Diagnosis not present

## 2014-09-26 DIAGNOSIS — M545 Low back pain: Secondary | ICD-10-CM | POA: Diagnosis not present

## 2014-09-26 DIAGNOSIS — N138 Other obstructive and reflux uropathy: Secondary | ICD-10-CM | POA: Insufficient documentation

## 2014-09-29 DIAGNOSIS — N401 Enlarged prostate with lower urinary tract symptoms: Secondary | ICD-10-CM | POA: Diagnosis not present

## 2014-10-07 DIAGNOSIS — N401 Enlarged prostate with lower urinary tract symptoms: Secondary | ICD-10-CM | POA: Diagnosis not present

## 2014-10-10 DIAGNOSIS — N401 Enlarged prostate with lower urinary tract symptoms: Secondary | ICD-10-CM | POA: Diagnosis not present

## 2014-10-10 DIAGNOSIS — N200 Calculus of kidney: Secondary | ICD-10-CM | POA: Diagnosis not present

## 2014-10-10 DIAGNOSIS — N508 Other specified disorders of male genital organs: Secondary | ICD-10-CM | POA: Diagnosis not present

## 2014-10-10 DIAGNOSIS — N390 Urinary tract infection, site not specified: Secondary | ICD-10-CM | POA: Diagnosis not present

## 2014-10-24 DIAGNOSIS — N401 Enlarged prostate with lower urinary tract symptoms: Secondary | ICD-10-CM | POA: Diagnosis not present

## 2014-10-31 NOTE — Consult Note (Signed)
PATIENT NAME:  Dennis [name], Dennis [name] MR#:  [id number] DATE OF BIRTH:  02-24-1946  CONSULTING PHYSICIAN:  Dickenson Sink, MD  REFERRING PHYSICIAN:  Conni Slipper, MD.  PRIMARY CARE PHYSICIAN: Peri Maris. Candiss Norse, MD.  CHIEF COMPLAINT:  Right lower extremity pain.  HISTORY OF PRESENT ILLNESS: Dennis [name] is a very nice 69 year old gentleman who has a history of several medical problems, including hypertension, gout, hyperlipidemia, hypothyroidism and depression. He has severe degenerative joint disease of his back and neck and he has been worked up for increased neck pain, neuropathy and possible nerve impingement at the level of the cervical spine. The patient has a history of right lower extremity pain for three days.  Apparently he has been helping out his in-laws who are very debilitated. He was moving one of his in-laws around from her recliner at some point he heard a pop in his ankle.  He did not pay too much attention, but the leg started to swell and it started to get really tender within hours.   On Friday his leg was really swollen and actually today apparently it is half of what it was yesterday. It has improved significantly, but the pain is still very bad. The patient comes to the ER, because his wife wanted to bring him in over here.  He did not want to come and his wife insisted on getting checked. Here in the ER, was examined by Dr. Cinda Quest.  He has an ultrasound of the right lower extremity that did not show any evidence of deep vein thrombosis.  X-rays did not show any significant findings, like fracture, just slight swelling of the soft tissue. Orthopaedics was consulted. They recommended going home and follow up on an outpatient, walking boot, ice and elevation. The patient was told all of this. He was going to be discharged. He got some morphine, which did not help the pain.  Dr. Cinda Quest offered him some Dilaudid. After the Dilaudid he had changes in his mental status, being very sleepy and  almost unresponsive for several seconds, but then this resolved after stimulation and so far he has been completely clear and back to his baseline. Dr. Cinda Quest asked me to admit the patient for that reason.  When I went to talk to the patient, he immediately told me that he really wanted to go home and sleep in his own bed, that he had a walking boot and that he wanted to follow up the recommendations of Orthopaedics.  His wife was in support with this at the moment. She was concerned about the episode of passing out, but now the patient is back to his baseline and she is okay taking him home. The patient promised to go and visit his Orthopaedic doctor in [facility] Monday or Tuesday. He does have four walking boots at home.   REVIEW OF SYSTEMS:  CONSTITUTIONAL: No fever, no fatigue. No weakness. Positive for leg pain.  EYES: No changes in vision, blurry vision, double vision.  ENT: No tinnitus. No difficulty swallowing.  RESPIRATORY: No cough. No wheezing. No chronic obstructive pulmonary disease.  CARDIOVASCULAR: No orthopnea, no significant edema. No palpitations.  GASTROINTESTINAL: No nausea or vomiting. No rectal bleeding. No hemorrhoids.  GU: No dysuria, hematuria or increased frequency. No prostatitis. He had a prostate surgery in the past.  ENDOCRINE: No polyuria, polydipsia, or polyphagia. No cold or heat intolerance.  HEMATOLOGIC/LYMPHATIC: No anemia, easy bruising or swollen glands.  MUSCULOSKELETAL: Positive back pain. Positive neck pain, which is chronic, as  mentioned above.  SKIN: No rashes or lesions. Now with just erythema and swelling of the right lower extremity.  No signs of cellulitis.   NEUROLOGIC: No numbness or tingling. No CVA. No transient ischemic attack. There is some problem with weakness of the upper extremities due to this neck pain.  PSYCHIATRIC:  No anxiety, no depression.   PAST MEDICAL HISTORY:  1.  Obstructive sleep apnea.  2.  Obesity.  3.  Hypothyroidism.  4.   Gout.  5.  Hypertension.  6.  Hyperlipidemia.  7.  Depression.  8.  Degenerative joint disease of the back and neck.   SOCIAL HISTORY: The patient denies any smoking, any alcohol.  He lives with his wife. 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   PAST SURGICAL HISTORY:  1.  Total knee replacement. 2.  Lower back surgery, 3.  TURP.  4.  Cataract surgery. 5.  Circumcision. 6.  Rotator cuff repair.  FAMILY HISTORY: Positive for ovarian cancer in his mom, Alzheimer's in his father and diabetes and hypertension and CVA, I believe in his father.   ALLERGIES: BEE STINGS.   CURRENT MEDICATIONS: Spironolactone 25 mg once a day; levothyroxine 1000 mg take 3 tablets once a day; furosemide 20 mg 2 times a day; Coreg 325 mg twice daily; atorvastatin 20 mg once a day; allopurinol 100 mg twice daily; acetaminophen with oxycodone 325/5 mg every 4 hours.   PHYSICAL EXAMINATION:  VITAL SIGNS: Blood pressure 204/109 at some point due to the pain, but right now it is 144/80. Heart rate 82, respiratory rate 20, temperature 98.  GENERAL: The patient is alert, oriented x 3. No acute distress. No respiratory distress. Hemodynamically stable.  HEENT:  Pupils are equal and reactive. Extraocular movements are intact. No oral lesions. No oropharyngeal exudates.  NECK: Supple. No JVD. No thyromegaly. No adenopathy.  CARDIOVASCULAR: Regular rate and rhythm. No murmurs, rubs, or gallops.  LUNGS: Clear without any wheezing or crepitus. No displacement of PMI on the heart.  No tenderness to palpation of anterior chest wall.  LUNGS: No use of accessory muscles.  ABDOMEN: Soft, nontender, nondistended. No hepatosplenomegaly. No masses. Bowel sounds are positive.  GENITAL: Deferred.  EXTREMITIES: No cyanosis, no clubbing. Positive edema of the right ankle up to middle anterior tibia and dorsum of the foot. Tenderness to palpation at the level of the calf muscle and Achilles tendon, also anterior  tibialis. [name] is equivocal, but we have ultrasound, which is negative for DVT. Pulses +2. Capillary refill less than three. Sensation is maintained.  SKIN: Mild erythema, mostly flushing. No signs of cellulitis in my opinion.  NEUROLOGIC: Cranial nerves II through XII intact.  LYMPHATIC: Negative for lymphadenopathy in neck or supraclavicular areas.   DIAGNOSTIC DATA: X-rays as mentioned above. No fracture.  DVT is negative on ultrasound.  Electrolytes within normal limits. Normal creatinine was 0.98. LFTs within normal limits with bilirubin of 1.3. Normal AST, ALT.  Troponin is 0.02.  White count 8.1, hemoglobin 15, platelets 214. Urinalysis shows six white blood cells which is mostly chronic after his prostatectomy. No signs or symptoms of urinary tract infection.   ASSESSMENT AND PLAN: This is a 69 year old gentleman with history of hypertension, gout, hypothyroidism, hyperlipidemia, depression, obstructive sleep apnea, who comes with pain of his right lower extremity, ankle in the anterior tibial area and gastrocnemius.  Negative ultrasound.  Negative x-rays.  Orthopaedics recommended going home with a walking boot. The patient has a walking boot. The reason  we were asked to admit him was because he had an episode where he was very lethargic after Dilaudid. The patient does not want to stay.  He is back to his baseline. His blood pressure was severely , accelerated hypertension up to 200s, although this was related to pain and at this moment his pain is better controlled.  His blood pressure is 144/80 for what I think it is safe for him to go.  Continue his pain medications, ice and elevation and follow up with the orthopedic doctor.  As I said, the patient would rather go home and this is especially because he says that staying in the hospital will exacerbate his neck pain and back pain, which I think this reasonable since there are really not any other medical issues going on.   We are going to  discharge the patient.  The patient is doing okay right now.    TIME SPENT: I spent about 45 minutes with this patient.     ____________________________ [place] Sink, MD rsg:eg D: 08/11/2012 22:06:53 ET T: 08/12/2012 19:12:08 ET JOB#: [id number]  cc: [place] Sink, MD, <Dictator> Taran K. Candiss Norse, MD Cristi Loron MD ELECTRONICALLY SIGNED 08/21/2012 13:24

## 2014-12-02 ENCOUNTER — Encounter: Payer: Self-pay | Admitting: Cardiovascular Disease

## 2014-12-02 ENCOUNTER — Ambulatory Visit (INDEPENDENT_AMBULATORY_CARE_PROVIDER_SITE_OTHER): Payer: Medicare Other | Admitting: Cardiovascular Disease

## 2014-12-02 VITALS — BP 130/82 | HR 69 | Ht 79.0 in | Wt 357.8 lb

## 2014-12-02 DIAGNOSIS — I5032 Chronic diastolic (congestive) heart failure: Secondary | ICD-10-CM | POA: Diagnosis not present

## 2014-12-02 DIAGNOSIS — I10 Essential (primary) hypertension: Secondary | ICD-10-CM

## 2014-12-02 DIAGNOSIS — E785 Hyperlipidemia, unspecified: Secondary | ICD-10-CM | POA: Diagnosis not present

## 2014-12-02 DIAGNOSIS — I2510 Atherosclerotic heart disease of native coronary artery without angina pectoris: Secondary | ICD-10-CM

## 2014-12-02 MED ORDER — CARVEDILOL 12.5 MG PO TABS: 12.5000 mg | ORAL_TABLET | Freq: Two times a day (BID) | ORAL | Status: DC

## 2014-12-02 MED ORDER — LOSARTAN POTASSIUM 100 MG PO TABS: 100.0000 mg | ORAL_TABLET | Freq: Every day | ORAL | Status: DC

## 2014-12-02 MED ORDER — TORSEMIDE 20 MG PO TABS: 20.0000 mg | ORAL_TABLET | Freq: Every day | ORAL | Status: DC

## 2014-12-02 MED ORDER — SPIRONOLACTONE 100 MG PO TABS: 100.0000 mg | ORAL_TABLET | Freq: Every day | ORAL | Status: DC

## 2014-12-02 MED ORDER — ATORVASTATIN CALCIUM 20 MG PO TABS: 20.0000 mg | ORAL_TABLET | Freq: Every day | ORAL | Status: DC
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
## 2014-12-02 NOTE — Patient Instructions (Signed)
Medication Instructions:  Your physician recommends that you continue on your current medications as directed. Please refer to the Current Medication list given to you today.   Labwork: none  Testing/Procedures: none  Follow-Up: Your physician wants you to follow-up in:  12 months.  You will receive a reminder letter in the mail two months in advance. If you don't receive a letter, please call our office to schedule the follow-up appointment.        

## 2014-12-09 DIAGNOSIS — N401 Enlarged prostate with lower urinary tract symptoms: Secondary | ICD-10-CM | POA: Diagnosis not present

## 2015-01-22 DIAGNOSIS — N390 Urinary tract infection, site not specified: Secondary | ICD-10-CM | POA: Diagnosis not present

## 2015-01-22 DIAGNOSIS — N401 Enlarged prostate with lower urinary tract symptoms: Secondary | ICD-10-CM | POA: Diagnosis not present

## 2015-01-22 DIAGNOSIS — N400 Enlarged prostate without lower urinary tract symptoms: Secondary | ICD-10-CM | POA: Diagnosis not present

## 2015-02-25 DIAGNOSIS — M7062 Trochanteric bursitis, left hip: Secondary | ICD-10-CM | POA: Diagnosis not present

## 2015-03-06 DIAGNOSIS — N401 Enlarged prostate with lower urinary tract symptoms: Secondary | ICD-10-CM | POA: Diagnosis not present

## 2015-03-18 DIAGNOSIS — Z01818 Encounter for other preprocedural examination: Secondary | ICD-10-CM | POA: Diagnosis not present

## 2015-03-31 DIAGNOSIS — N451 Epididymitis: Secondary | ICD-10-CM | POA: Diagnosis not present

## 2015-03-31 DIAGNOSIS — M545 Low back pain: Secondary | ICD-10-CM | POA: Diagnosis not present

## 2015-03-31 DIAGNOSIS — N508 Other specified disorders of male genital organs: Secondary | ICD-10-CM | POA: Diagnosis not present

## 2015-03-31 DIAGNOSIS — G8929 Other chronic pain: Secondary | ICD-10-CM | POA: Diagnosis not present

## 2015-03-31 DIAGNOSIS — N401 Enlarged prostate with lower urinary tract symptoms: Secondary | ICD-10-CM | POA: Diagnosis not present

## 2015-03-31 DIAGNOSIS — N390 Urinary tract infection, site not specified: Secondary | ICD-10-CM | POA: Diagnosis not present

## 2015-05-07 DIAGNOSIS — N400 Enlarged prostate without lower urinary tract symptoms: Secondary | ICD-10-CM | POA: Diagnosis not present

## 2015-05-07 DIAGNOSIS — N390 Urinary tract infection, site not specified: Secondary | ICD-10-CM | POA: Diagnosis not present

## 2015-05-18 DIAGNOSIS — N390 Urinary tract infection, site not specified: Secondary | ICD-10-CM | POA: Diagnosis not present

## 2015-05-18 DIAGNOSIS — N451 Epididymitis: Secondary | ICD-10-CM | POA: Diagnosis not present

## 2015-05-18 DIAGNOSIS — N400 Enlarged prostate without lower urinary tract symptoms: Secondary | ICD-10-CM | POA: Diagnosis not present

## 2015-05-18 DIAGNOSIS — G4733 Obstructive sleep apnea (adult) (pediatric): Secondary | ICD-10-CM | POA: Diagnosis not present

## 2015-05-25 DIAGNOSIS — N433 Hydrocele, unspecified: Secondary | ICD-10-CM | POA: Diagnosis not present

## 2015-05-25 DIAGNOSIS — I10 Essential (primary) hypertension: Secondary | ICD-10-CM | POA: Diagnosis not present

## 2015-05-25 DIAGNOSIS — N50819 Testicular pain, unspecified: Secondary | ICD-10-CM | POA: Diagnosis not present

## 2015-05-25 DIAGNOSIS — N453 Epididymo-orchitis: Secondary | ICD-10-CM | POA: Diagnosis not present

## 2015-05-25 DIAGNOSIS — Z6841 Body Mass Index (BMI) 40.0 and over, adult: Secondary | ICD-10-CM | POA: Diagnosis not present

## 2015-05-25 DIAGNOSIS — E7800 Pure hypercholesterolemia, unspecified: Secondary | ICD-10-CM | POA: Diagnosis not present

## 2015-05-25 DIAGNOSIS — N390 Urinary tract infection, site not specified: Secondary | ICD-10-CM | POA: Diagnosis not present

## 2015-05-25 DIAGNOSIS — M1A9XX0 Chronic gout, unspecified, without tophus (tophi): Secondary | ICD-10-CM | POA: Diagnosis not present

## 2015-05-26 DIAGNOSIS — Z8673 Personal history of transient ischemic attack (TIA), and cerebral infarction without residual deficits: Secondary | ICD-10-CM | POA: Diagnosis not present

## 2015-05-26 DIAGNOSIS — R350 Frequency of micturition: Secondary | ICD-10-CM | POA: Diagnosis present

## 2015-05-26 DIAGNOSIS — C61 Malignant neoplasm of prostate: Secondary | ICD-10-CM | POA: Diagnosis not present

## 2015-05-26 DIAGNOSIS — G4733 Obstructive sleep apnea (adult) (pediatric): Secondary | ICD-10-CM | POA: Diagnosis present

## 2015-05-26 DIAGNOSIS — E785 Hyperlipidemia, unspecified: Secondary | ICD-10-CM | POA: Diagnosis present

## 2015-05-26 DIAGNOSIS — R739 Hyperglycemia, unspecified: Secondary | ICD-10-CM | POA: Diagnosis not present

## 2015-05-26 DIAGNOSIS — N390 Urinary tract infection, site not specified: Secondary | ICD-10-CM | POA: Diagnosis not present

## 2015-05-26 DIAGNOSIS — M545 Low back pain: Secondary | ICD-10-CM | POA: Diagnosis present

## 2015-05-26 DIAGNOSIS — N453 Epididymo-orchitis: Secondary | ICD-10-CM | POA: Diagnosis not present

## 2015-05-26 DIAGNOSIS — N50819 Testicular pain, unspecified: Secondary | ICD-10-CM | POA: Diagnosis not present

## 2015-05-26 DIAGNOSIS — N50812 Left testicular pain: Secondary | ICD-10-CM | POA: Diagnosis not present

## 2015-05-26 DIAGNOSIS — B961 Klebsiella pneumoniae [K. pneumoniae] as the cause of diseases classified elsewhere: Secondary | ICD-10-CM | POA: Diagnosis not present

## 2015-05-26 DIAGNOSIS — R109 Unspecified abdominal pain: Secondary | ICD-10-CM | POA: Diagnosis not present

## 2015-05-26 DIAGNOSIS — G8929 Other chronic pain: Secondary | ICD-10-CM | POA: Diagnosis present

## 2015-05-26 DIAGNOSIS — Z6841 Body Mass Index (BMI) 40.0 and over, adult: Secondary | ICD-10-CM | POA: Diagnosis not present

## 2015-05-26 DIAGNOSIS — E669 Obesity, unspecified: Secondary | ICD-10-CM | POA: Diagnosis present

## 2015-05-26 DIAGNOSIS — R102 Pelvic and perineal pain: Secondary | ICD-10-CM | POA: Diagnosis not present

## 2015-05-26 DIAGNOSIS — I10 Essential (primary) hypertension: Secondary | ICD-10-CM | POA: Diagnosis not present

## 2015-05-26 DIAGNOSIS — Z9079 Acquired absence of other genital organ(s): Secondary | ICD-10-CM | POA: Diagnosis not present

## 2015-05-26 DIAGNOSIS — Z791 Long term (current) use of non-steroidal anti-inflammatories (NSAID): Secondary | ICD-10-CM | POA: Diagnosis not present

## 2015-05-26 DIAGNOSIS — N3941 Urge incontinence: Secondary | ICD-10-CM | POA: Diagnosis not present

## 2015-05-26 DIAGNOSIS — I959 Hypotension, unspecified: Secondary | ICD-10-CM | POA: Diagnosis not present

## 2015-05-26 DIAGNOSIS — R569 Unspecified convulsions: Secondary | ICD-10-CM | POA: Diagnosis not present

## 2015-05-26 DIAGNOSIS — I252 Old myocardial infarction: Secondary | ICD-10-CM | POA: Diagnosis not present

## 2015-05-26 DIAGNOSIS — N401 Enlarged prostate with lower urinary tract symptoms: Secondary | ICD-10-CM | POA: Diagnosis not present

## 2015-05-26 DIAGNOSIS — M1A9XX0 Chronic gout, unspecified, without tophus (tophi): Secondary | ICD-10-CM | POA: Diagnosis present

## 2015-05-26 DIAGNOSIS — N442 Benign cyst of testis: Secondary | ICD-10-CM | POA: Diagnosis not present

## 2015-05-26 DIAGNOSIS — Z7982 Long term (current) use of aspirin: Secondary | ICD-10-CM | POA: Diagnosis not present

## 2015-05-26 DIAGNOSIS — E876 Hypokalemia: Secondary | ICD-10-CM | POA: Diagnosis not present

## 2015-05-26 DIAGNOSIS — R55 Syncope and collapse: Secondary | ICD-10-CM | POA: Diagnosis not present

## 2015-05-26 DIAGNOSIS — R1032 Left lower quadrant pain: Secondary | ICD-10-CM | POA: Diagnosis not present

## 2015-05-26 DIAGNOSIS — N200 Calculus of kidney: Secondary | ICD-10-CM | POA: Diagnosis not present

## 2015-05-26 DIAGNOSIS — N139 Obstructive and reflux uropathy, unspecified: Secondary | ICD-10-CM | POA: Diagnosis not present

## 2015-05-26 DIAGNOSIS — R300 Dysuria: Secondary | ICD-10-CM | POA: Diagnosis not present

## 2015-05-26 DIAGNOSIS — N138 Other obstructive and reflux uropathy: Secondary | ICD-10-CM | POA: Diagnosis not present

## 2015-05-26 DIAGNOSIS — Z8546 Personal history of malignant neoplasm of prostate: Secondary | ICD-10-CM | POA: Diagnosis not present

## 2015-05-26 DIAGNOSIS — I2510 Atherosclerotic heart disease of native coronary artery without angina pectoris: Secondary | ICD-10-CM | POA: Diagnosis present

## 2015-05-26 DIAGNOSIS — E7800 Pure hypercholesterolemia, unspecified: Secondary | ICD-10-CM | POA: Diagnosis present

## 2015-05-26 DIAGNOSIS — J150 Pneumonia due to Klebsiella pneumoniae: Secondary | ICD-10-CM | POA: Diagnosis not present

## 2015-05-26 DIAGNOSIS — M1990 Unspecified osteoarthritis, unspecified site: Secondary | ICD-10-CM | POA: Diagnosis present

## 2015-06-02 DIAGNOSIS — R739 Hyperglycemia, unspecified: Secondary | ICD-10-CM | POA: Insufficient documentation

## 2015-06-02 LAB — BASIC METABOLIC PANEL
BUN: 12 mg/dL (ref 4–21)
Creatinine: 1.1 mg/dL (ref 0.6–1.3)
Glucose: 173 mg/dL
Sodium: 128 mmol/L — AB (ref 137–147)

## 2015-06-02 LAB — CBC AND DIFFERENTIAL
Neutrophils Absolute: 4 /uL
WBC: 8 10^3/mL

## 2015-06-10 ENCOUNTER — Inpatient Hospital Stay: Payer: Self-pay | Admitting: Family Medicine

## 2015-06-19 DIAGNOSIS — N390 Urinary tract infection, site not specified: Secondary | ICD-10-CM | POA: Diagnosis not present

## 2015-06-19 DIAGNOSIS — N401 Enlarged prostate with lower urinary tract symptoms: Secondary | ICD-10-CM | POA: Diagnosis not present

## 2015-06-19 DIAGNOSIS — N50819 Testicular pain, unspecified: Secondary | ICD-10-CM | POA: Diagnosis not present

## 2015-06-19 DIAGNOSIS — G8929 Other chronic pain: Secondary | ICD-10-CM | POA: Diagnosis not present

## 2015-06-19 DIAGNOSIS — N451 Epididymitis: Secondary | ICD-10-CM | POA: Diagnosis not present

## 2015-06-19 DIAGNOSIS — N138 Other obstructive and reflux uropathy: Secondary | ICD-10-CM | POA: Diagnosis not present

## 2015-06-22 DIAGNOSIS — N390 Urinary tract infection, site not specified: Secondary | ICD-10-CM | POA: Diagnosis not present

## 2015-06-29 DIAGNOSIS — B961 Klebsiella pneumoniae [K. pneumoniae] as the cause of diseases classified elsewhere: Secondary | ICD-10-CM | POA: Diagnosis not present

## 2015-06-29 DIAGNOSIS — N50819 Testicular pain, unspecified: Secondary | ICD-10-CM | POA: Diagnosis not present

## 2015-06-29 DIAGNOSIS — Z299 Encounter for prophylactic measures, unspecified: Secondary | ICD-10-CM | POA: Diagnosis not present

## 2015-06-29 DIAGNOSIS — C61 Malignant neoplasm of prostate: Secondary | ICD-10-CM | POA: Diagnosis not present

## 2015-06-29 DIAGNOSIS — R300 Dysuria: Secondary | ICD-10-CM | POA: Diagnosis not present

## 2015-06-29 DIAGNOSIS — N390 Urinary tract infection, site not specified: Secondary | ICD-10-CM | POA: Diagnosis not present

## 2015-06-29 DIAGNOSIS — Z23 Encounter for immunization: Secondary | ICD-10-CM | POA: Diagnosis not present

## 2015-06-30 ENCOUNTER — Inpatient Hospital Stay: Payer: Self-pay | Admitting: Family Medicine

## 2015-07-14 DIAGNOSIS — R109 Unspecified abdominal pain: Secondary | ICD-10-CM | POA: Diagnosis not present

## 2015-07-14 DIAGNOSIS — N390 Urinary tract infection, site not specified: Secondary | ICD-10-CM | POA: Diagnosis not present

## 2015-07-14 DIAGNOSIS — N200 Calculus of kidney: Secondary | ICD-10-CM | POA: Diagnosis not present

## 2015-07-14 DIAGNOSIS — R319 Hematuria, unspecified: Secondary | ICD-10-CM | POA: Diagnosis not present

## 2015-07-15 DIAGNOSIS — N390 Urinary tract infection, site not specified: Secondary | ICD-10-CM | POA: Diagnosis not present

## 2015-07-23 DIAGNOSIS — M109 Gout, unspecified: Secondary | ICD-10-CM | POA: Diagnosis not present

## 2015-07-30 DIAGNOSIS — M25441 Effusion, right hand: Secondary | ICD-10-CM | POA: Diagnosis not present

## 2015-07-30 DIAGNOSIS — M1A0411 Idiopathic chronic gout, right hand, with tophus (tophi): Secondary | ICD-10-CM | POA: Diagnosis not present

## 2015-08-05 DIAGNOSIS — N390 Urinary tract infection, site not specified: Secondary | ICD-10-CM | POA: Diagnosis not present

## 2015-08-06 DIAGNOSIS — M1A9XX1 Chronic gout, unspecified, with tophus (tophi): Secondary | ICD-10-CM | POA: Diagnosis not present

## 2015-08-07 ENCOUNTER — Encounter (HOSPITAL_COMMUNITY): Payer: Self-pay | Admitting: *Deleted

## 2015-08-07 DIAGNOSIS — M79644 Pain in right finger(s): Secondary | ICD-10-CM | POA: Diagnosis not present

## 2015-08-07 NOTE — Progress Notes (Signed)
Anesthesia Chart Review: SAME DAY WORK-UP.  Patient is a 70 year old male scheduled for right index finger I&D and mass excision on 08/08/15 by Dr. Amedeo Plenty. DX: Infected index finger tophaceous gout.   History includes non-smoker, CAD/NSTEMI '01, post-operative N/V, HTN, hypothyroidism, chronic bronchitis, hypercholesterolemia, OSA on CPAP, CVA '04, [place] Spotted Fever, L4-5 PLIF 07/17/13, ALIF '13, TURBT '14. Last BMI recorded was 40, consistent with morbid obesity. PCP is listed as Dr. Miguel Aschoff. Cardiologist is [name], last visit 12/02/14--no new testing with one year follow-up recommended.   Meds include ASA, Coreg, Lipitor, Lexapro, levothyroxine, losartan, KCl, spironolactone, torsemide.  08/06/15 EKG: Baseline is too noisey to be sure about the rhythm. I suspect this is still sinus. LAD.   02/24/14 Echo: Study Conclusions - Left ventricle: The cavity size was normal. Systolic function was normal. The estimated ejection fraction was in the range of 60% to 65%. Wall motion was normal; there were no regional wall motion abnormalities. There was an increased relative contribution of atrial contraction to ventricular filling. Doppler parameters are consistent with abnormal left ventricular relaxation (grade 1 diastolic dysfunction). - Aorta: Ascending aortic diameter: 41 mm (S). - Ascending aorta: The ascending aorta was mildly dilated. - Left atrium: The atrium was mildly dilated. - Tricuspid valve: There was trivial regurgitation. - Pulmonary arteries: PA peak pressure: 33 mm Hg (S).  02/21/12 Nuclear stress test: Conclusion: No infarct or ischemia. Wall motion and EF WNL, EF 59%.  03/20/00 LHC: CONCLUSIONS: Nonobstructive coronary artery disease with 50-70% narrowing in the proximal and the mid left anterior descending and tandem 40% stenosis in the mid right coronary artery with normal endothelia dysfunction or spasm. We will plan to treat him with Altace and  nitrates and aspirin and secondary prevention.   08/05/14 1V CXR: IMPRESSION: Lungs hypoexpanded. Vascular congestion and mild cardiomegaly, with mild bilateral atelectasis. Small right pleural effusion seen.  He will need labs on arrival.  Discussed with anesthesiologist Dr. Kalman Shan. Patient had cardiology follow-up within the past year. If no new CV symptoms then it is anticipated that he can proceed as planned. However, we will repeat his EKG to ensure he is in a SR (and not aflutter).   Further evaluation on the day of surgery by his anesthesiologist.  Myra Gianotti, PA-C [facility] Short Stay Center/Anesthesiology Phone [phone] 08/07/2015 4:40 PM

## 2015-08-07 NOTE — Progress Notes (Signed)
Pt denies SOB and chest pain but is under the care of [name], Cardiology. Pt  Made aware to stop taking Aspirin, otc vitamins and herbal medications. Do not take any NSAIDs ie: Ibuprofen, Advil, Naproxen or any medication containing Aspirin such as Indomethacin. Spoke with Judeen Hammans, CMA,  regarding cardiac clearance; waiting for Judeen Hammans to F/U. Spoke with Ebony Hail, [place], Anesthesia, regarding pt history; pt needs an EKG on DOS ( see note). Pt verbalized understanding of all pre-op instructions.

## 2015-08-08 ENCOUNTER — Encounter (HOSPITAL_COMMUNITY): Payer: Self-pay | Admitting: *Deleted

## 2015-08-08 ENCOUNTER — Ambulatory Visit (HOSPITAL_COMMUNITY): Payer: Medicare Other | Admitting: Vascular Surgery

## 2015-08-08 ENCOUNTER — Ambulatory Visit (HOSPITAL_COMMUNITY)
Admission: RE | Admit: 2015-08-08 | Discharge: 2015-08-08 | Disposition: A | Discharge status: Home / Self Care | Payer: Medicare Other | Source: Ambulatory Visit | Attending: Orthopedic Surgery | Admitting: Orthopedic Surgery

## 2015-08-08 ENCOUNTER — Encounter (HOSPITAL_COMMUNITY)
Admission: RE | Disposition: A | Discharge status: Home / Self Care | Payer: Self-pay | Source: Ambulatory Visit | Attending: Orthopedic Surgery

## 2015-08-08 DIAGNOSIS — I252 Old myocardial infarction: Secondary | ICD-10-CM | POA: Diagnosis not present

## 2015-08-08 DIAGNOSIS — M1A0411 Idiopathic chronic gout, right hand, with tophus (tophi): Secondary | ICD-10-CM | POA: Diagnosis not present

## 2015-08-08 DIAGNOSIS — G8918 Other acute postprocedural pain: Secondary | ICD-10-CM | POA: Diagnosis not present

## 2015-08-08 DIAGNOSIS — M13841 Other specified arthritis, right hand: Secondary | ICD-10-CM | POA: Diagnosis not present

## 2015-08-08 DIAGNOSIS — Z6836 Body mass index (BMI) 36.0-36.9, adult: Secondary | ICD-10-CM | POA: Diagnosis not present

## 2015-08-08 DIAGNOSIS — M1990 Unspecified osteoarthritis, unspecified site: Secondary | ICD-10-CM | POA: Insufficient documentation

## 2015-08-08 DIAGNOSIS — M6588 Other synovitis and tenosynovitis, other site: Secondary | ICD-10-CM | POA: Diagnosis not present

## 2015-08-08 DIAGNOSIS — G4733 Obstructive sleep apnea (adult) (pediatric): Secondary | ICD-10-CM | POA: Insufficient documentation

## 2015-08-08 DIAGNOSIS — M1A9XX1 Chronic gout, unspecified, with tophus (tophi): Secondary | ICD-10-CM | POA: Diagnosis not present

## 2015-08-08 DIAGNOSIS — Z8673 Personal history of transient ischemic attack (TIA), and cerebral infarction without residual deficits: Secondary | ICD-10-CM | POA: Insufficient documentation

## 2015-08-08 DIAGNOSIS — I2510 Atherosclerotic heart disease of native coronary artery without angina pectoris: Secondary | ICD-10-CM | POA: Diagnosis not present

## 2015-08-08 DIAGNOSIS — M10441 Other secondary gout, right hand: Secondary | ICD-10-CM | POA: Diagnosis not present

## 2015-08-08 DIAGNOSIS — E039 Hypothyroidism, unspecified: Secondary | ICD-10-CM | POA: Diagnosis not present

## 2015-08-08 DIAGNOSIS — I10 Essential (primary) hypertension: Secondary | ICD-10-CM | POA: Diagnosis not present

## 2015-08-08 HISTORY — PX: INCISION AND DRAINAGE OF WOUND: SHX1803

## 2015-08-08 HISTORY — DX: Calculus of kidney: N20.0

## 2015-08-08 LAB — BASIC METABOLIC PANEL
Anion gap: 10 (ref 5–15)
BUN: 15 mg/dL (ref 6–20)
CO2: 27 mmol/L (ref 22–32)
Calcium: 9.4 mg/dL (ref 8.9–10.3)
Chloride: 102 mmol/L (ref 101–111)
Creatinine, Ser: 1.15 mg/dL (ref 0.61–1.24)
GFR calc Af Amer: >60 mL/min (ref >60)
GFR calc non Af Amer: >60 mL/min (ref >60)
Glucose, Bld: 104 mg/dL — ABNORMAL HIGH (ref 65–99)
Potassium: 4 mmol/L (ref 3.5–5.1)
Sodium: 139 mmol/L (ref 135–145)

## 2015-08-08 SURGERY — IRRIGATION AND DEBRIDEMENT WOUND
Anesthesia: General | Site: Face | Laterality: Right

## 2015-08-08 MED ORDER — SODIUM CHLORIDE 0.9 % IJ SOLN
INTRAMUSCULAR | Status: AC
  Filled 2015-08-08: qty 10

## 2015-08-08 MED ORDER — MIDAZOLAM HCL 2 MG/2ML IJ SOLN
INTRAMUSCULAR | Status: AC
  Filled 2015-08-08: qty 2

## 2015-08-08 MED ORDER — LACTATED RINGERS IV SOLN: INTRAVENOUS | Status: DC

## 2015-08-08 MED ORDER — LIDOCAINE HCL (CARDIAC) 20 MG/ML IV SOLN
INTRAVENOUS | Status: DC | PRN
  Administered 2015-08-08: 60 mg via INTRATRACHEAL

## 2015-08-08 MED ORDER — FENTANYL CITRATE (PF) 250 MCG/5ML IJ SOLN
INTRAMUSCULAR | Status: DC | PRN
  Administered 2015-08-08 (×2): 100 ug via INTRAVENOUS

## 2015-08-08 MED ORDER — PROPOFOL 10 MG/ML IV BOLUS
INTRAVENOUS | Status: DC | PRN
  Administered 2015-08-08: 200 mg via INTRAVENOUS

## 2015-08-08 MED ORDER — PROPOFOL 10 MG/ML IV BOLUS
INTRAVENOUS | Status: AC
  Filled 2015-08-08: qty 20

## 2015-08-08 MED ORDER — HYDROMORPHONE HCL 2 MG PO TABS: 4.0000 mg | ORAL_TABLET | ORAL | Status: DC | PRN

## 2015-08-08 MED ORDER — MIDAZOLAM HCL 2 MG/2ML IJ SOLN
INTRAMUSCULAR | Status: DC | PRN
  Administered 2015-08-08: 2 mg via INTRAVENOUS

## 2015-08-08 MED ORDER — BUPIVACAINE-EPINEPHRINE (PF) 0.5% -1:200000 IJ SOLN
INTRAMUSCULAR | Status: DC | PRN
  Administered 2015-08-08: 30 mL via PERINEURAL

## 2015-08-08 MED ORDER — LACTATED RINGERS IV SOLN
INTRAVENOUS | Status: DC
  Administered 2015-08-08 (×2): via INTRAVENOUS

## 2015-08-08 MED ORDER — FENTANYL CITRATE (PF) 250 MCG/5ML IJ SOLN
INTRAMUSCULAR | Status: AC
  Filled 2015-08-08: qty 5

## 2015-08-08 MED ORDER — HYDROMORPHONE HCL 1 MG/ML IJ SOLN: 0.2500 mg | INTRAMUSCULAR | Status: DC | PRN

## 2015-08-08 MED ORDER — ONDANSETRON HCL 4 MG/2ML IJ SOLN
INTRAMUSCULAR | Status: DC | PRN
  Administered 2015-08-08: 4 mg via INTRAVENOUS

## 2015-08-08 MED ORDER — DEXTROSE 5 % IV SOLN
3.0000 g | Freq: Once | INTRAVENOUS | Status: AC
  Administered 2015-08-08: 3 g via INTRAVENOUS
  Filled 2015-08-08 (×2): qty 3000

## 2015-08-08 MED ORDER — DEXAMETHASONE SODIUM PHOSPHATE 10 MG/ML IJ SOLN
INTRAMUSCULAR | Status: DC | PRN
  Administered 2015-08-08: 10 mg via INTRAVENOUS

## 2015-08-08 MED ORDER — LIDOCAINE HCL (CARDIAC) 20 MG/ML IV SOLN
INTRAVENOUS | Status: AC
  Filled 2015-08-08: qty 5

## 2015-08-08 MED ORDER — SODIUM CHLORIDE 0.9 % IR SOLN
Status: DC | PRN
  Administered 2015-08-08: 3000 mL

## 2015-08-08 MED ORDER — CHLORHEXIDINE GLUCONATE 4 % EX LIQD: 60.0000 mL | Freq: Once | CUTANEOUS | Status: DC

## 2015-08-08 MED ORDER — PROMETHAZINE HCL 25 MG/ML IJ SOLN
6.2500 mg | INTRAMUSCULAR | Status: DC | PRN
Start: 2015-08-08 — End: 2015-08-08

## 2015-08-08 MED ORDER — ONDANSETRON HCL 4 MG/2ML IJ SOLN
INTRAMUSCULAR | Status: AC
  Filled 2015-08-08: qty 2

## 2015-08-08 MED ORDER — EPHEDRINE SULFATE 50 MG/ML IJ SOLN
INTRAMUSCULAR | Status: DC | PRN
  Administered 2015-08-08: 10 mg via INTRAVENOUS
  Administered 2015-08-08: 15 mg via INTRAVENOUS
  Administered 2015-08-08: 10 mg via INTRAVENOUS
  Administered 2015-08-08: 15 mg via INTRAVENOUS

## 2015-08-08 MED ORDER — PHENYLEPHRINE HCL 10 MG/ML IJ SOLN
10.0000 mg | INTRAVENOUS | Status: DC | PRN
  Administered 2015-08-08: 60 ug/min via INTRAVENOUS

## 2015-08-08 MED ORDER — EPHEDRINE SULFATE 50 MG/ML IJ SOLN
INTRAMUSCULAR | Status: AC
  Filled 2015-08-08: qty 1

## 2015-08-08 MED ORDER — DEXAMETHASONE SODIUM PHOSPHATE 10 MG/ML IJ SOLN
INTRAMUSCULAR | Status: AC
  Filled 2015-08-08: qty 1

## 2015-08-08 MED ORDER — MEPERIDINE HCL 25 MG/ML IJ SOLN
6.2500 mg | INTRAMUSCULAR | Status: DC | PRN
Start: 2015-08-08 — End: 2015-08-08

## 2015-08-08 MED ORDER — CEFAZOLIN SODIUM-DEXTROSE 2-3 GM-% IV SOLR: 2.0000 g | INTRAVENOUS | Status: DC

## 2015-08-08 SURGICAL SUPPLY — 44 items
BANDAGE ACE 3X5.8 VEL STRL LF (GAUZE/BANDAGES/DRESSINGS) ×2 IMPLANT
BANDAGE ACE 4X5 VEL STRL LF (GAUZE/BANDAGES/DRESSINGS) ×2 IMPLANT
BANDAGE ELASTIC 4 VELCRO ST LF (GAUZE/BANDAGES/DRESSINGS) ×2 IMPLANT
BNDG CONFORM 2 STRL LF (GAUZE/BANDAGES/DRESSINGS) IMPLANT
BNDG ESMARK 4X9 LF (GAUZE/BANDAGES/DRESSINGS) ×2 IMPLANT
BNDG GAUZE ELAST 4 BULKY (GAUZE/BANDAGES/DRESSINGS) ×2 IMPLANT
CORDS BIPOLAR (ELECTRODE) ×2 IMPLANT
CUFF TOURNIQUET SINGLE 18IN (TOURNIQUET CUFF) ×2 IMPLANT
CUFF TOURNIQUET SINGLE 24IN (TOURNIQUET CUFF) IMPLANT
DRSG ADAPTIC 3X8 NADH LF (GAUZE/BANDAGES/DRESSINGS) ×2 IMPLANT
GAUZE SPONGE 4X4 12PLY STRL (GAUZE/BANDAGES/DRESSINGS) IMPLANT
GAUZE XEROFORM 1X8 LF (GAUZE/BANDAGES/DRESSINGS) ×2 IMPLANT
GLOVE BIO SURGEON STRL SZ7.5 (GLOVE) ×2 IMPLANT
GLOVE BIOGEL M STRL SZ7.5 (GLOVE) ×2 IMPLANT
GLOVE SS BIOGEL STRL SZ 8 (GLOVE) ×1 IMPLANT
GLOVE SUPERSENSE BIOGEL SZ 8 (GLOVE) ×1
GOWN STRL REUS W/ TWL LRG LVL3 (GOWN DISPOSABLE) ×1 IMPLANT
GOWN STRL REUS W/ TWL XL LVL3 (GOWN DISPOSABLE) ×1 IMPLANT
GOWN STRL REUS W/TWL LRG LVL3 (GOWN DISPOSABLE) ×1
GOWN STRL REUS W/TWL XL LVL3 (GOWN DISPOSABLE) ×1
HANDPIECE INTERPULSE COAX TIP (DISPOSABLE)
KIT BASIN OR (CUSTOM PROCEDURE TRAY) ×2 IMPLANT
KIT ROOM TURNOVER OR (KITS) ×2 IMPLANT
LOOP VESSEL MAXI BLUE (MISCELLANEOUS) ×2 IMPLANT
MANIFOLD NEPTUNE II (INSTRUMENTS) ×2 IMPLANT
NEEDLE HYPO 25GX1X1/2 BEV (NEEDLE) IMPLANT
NS IRRIG 1000ML POUR BTL (IV SOLUTION) IMPLANT
PACK ORTHO EXTREMITY (CUSTOM PROCEDURE TRAY) ×2 IMPLANT
PAD ARMBOARD 7.5X6 YLW CONV (MISCELLANEOUS) ×2 IMPLANT
PAD CAST 4YDX4 CTTN HI CHSV (CAST SUPPLIES) IMPLANT
PADDING CAST COTTON 4X4 STRL (CAST SUPPLIES)
SET HNDPC FAN SPRY TIP SCT (DISPOSABLE) IMPLANT
SPONGE GAUZE 4X4 12PLY STER LF (GAUZE/BANDAGES/DRESSINGS) ×2 IMPLANT
SPONGE LAP 4X18 X RAY DECT (DISPOSABLE) IMPLANT
SUCTION FRAZIER TIP 10 FR DISP (SUCTIONS) ×2 IMPLANT
SUT PROLENE 4 0 PS 2 18 (SUTURE) ×4 IMPLANT
SWAB COLLECTION DEVICE MRSA (MISCELLANEOUS) ×2 IMPLANT
SYR CONTROL 10ML LL (SYRINGE) IMPLANT
TOWEL OR 17X24 6PK STRL BLUE (TOWEL DISPOSABLE) ×2 IMPLANT
TOWEL OR 17X26 10 PK STRL BLUE (TOWEL DISPOSABLE) ×2 IMPLANT
TUBE ANAEROBIC SPECIMEN COL (MISCELLANEOUS) IMPLANT
TUBE CONNECTING 12X1/4 (SUCTIONS) ×2 IMPLANT
WATER STERILE IRR 1000ML POUR (IV SOLUTION) IMPLANT
YANKAUER SUCT BULB TIP NO VENT (SUCTIONS) IMPLANT

## 2015-08-08 NOTE — Discharge Instructions (Signed)
You did great. Please come to the office Monday at 10 AM for follow-up  Please keep your bandage clean and dry and do not remove it     Keep bandage clean and dry.  Call for any problems.  No smoking.  Criteria for driving a car: you should be off your pain medicine for 7-8 hours, able to drive one handed(confident), thinking clearly and feeling able in your judgement to drive. Continue elevation as it will decrease swelling.  If instructed by MD move your fingers within the confines of the bandage/splint.  Use ice if instructed by your MD. Call immediately for any sudden loss of feeling in your hand/arm or change in functional abilities of the extremity.We recommend that you to take vitamin C 1000 mg a day to promote healing. We also recommend that if you require  pain medicine that you take a stool softener to prevent constipation as most pain medicines will have constipation side effects. We recommend either Peri-Colace or Senokot and recommend that you also consider adding MiraLAX as well to prevent the constipation affects from pain medicine if you are required to use them. These medicines are over the counter and may be purchased at a local pharmacy. A cup of yogurt and a probiotic can also be helpful during the recovery process as the medicines can disrupt your intestinal environment.

## 2015-08-08 NOTE — Op Note (Signed)
See [id number] Amedeo Plenty MD

## 2015-08-08 NOTE — Transfer of Care (Signed)
Immediate Anesthesia Transfer of Care Note  Patient: Dennis Piety Nylund Sr.  Procedure(s) Performed: Procedure(s) with comments: RIGHT INDEX FINGER IRRIGATION AND DEBRIDEMENT AND MASS EXCISION (Right) Water engineer  Patient Location: PACU  Anesthesia Type:General  Level of Consciousness: awake  Airway & Oxygen Therapy: Patient Spontanous Breathing and Patient connected to nasal cannula oxygen  Post-op Assessment: Report given to RN and Post -op Vital signs reviewed and stable  Post vital signs: Reviewed and stable  Last Vitals:  Filed Vitals:   08/08/15 0812  BP: 108/54  Pulse: 57  Temp: 36.6 C  Resp: 16    Complications: No apparent anesthesia complications

## 2015-08-08 NOTE — Progress Notes (Signed)
Orthopedic Tech Progress Note Patient Details:  Dennis [name] Sr. 17-Jul-1945 [id number]  Ortho Devices Type of Ortho Device: Arm sling Ortho Device/Splint Interventions: Application   [place] Pink 08/08/2015, 12:46 PM

## 2015-08-08 NOTE — Anesthesia Procedure Notes (Addendum)
Anesthesia Regional Block:  Supraclavicular block  Pre-Anesthetic Checklist: ,, timeout performed, Correct Patient, Correct Site, Correct Laterality, Correct Procedure, Correct Position, site marked, Risks and benefits discussed,  Surgical consent,  Pre-op evaluation,  At surgeon's request and post-op pain management  Laterality: Right  Prep: chloraprep       Needles:  Injection technique: Single-shot  Needle Type: Echogenic Needle     Needle Length: 9cm 9 cm Needle Gauge: 21 and 21 G    Additional Needles:  Procedures: ultrasound guided (picture in chart) Supraclavicular block Narrative:  Start time: 08/08/2015 9:30 AM End time: 08/08/2015 9:33 AM Injection made incrementally with aspirations every 5 mL.  Performed by: Personally  Anesthesiologist: Suella Broad D   Procedure Name: LMA Insertion Date/Time: 08/08/2015 10:43 AM Performed by: Marinda Elk A Pre-anesthesia Checklist: Patient identified, Timeout performed, Emergency Drugs available, Suction available and Patient being monitored Patient Re-evaluated:Patient Re-evaluated prior to inductionOxygen Delivery Method: Circle system utilized Preoxygenation: Pre-oxygenation with 100% oxygen Intubation Type: IV induction Ventilation: Mask ventilation without difficulty LMA Size: 5.0 Number of attempts: 1 Placement Confirmation: positive ETCO2 and breath sounds checked- equal and bilateral Tube secured with: Tape Dental Injury: Teeth and Oropharynx as per pre-operative assessment

## 2015-08-08 NOTE — Anesthesia Postprocedure Evaluation (Signed)
Anesthesia Post Note  Patient: Dennis Piety Shone Sr.  Procedure(s) Performed: Procedure(s) (LRB): RIGHT INDEX FINGER IRRIGATION AND DEBRIDEMENT AND MASS EXCISION (Right)  Patient location during evaluation: PACU Anesthesia Type: General and Regional Level of consciousness: awake and alert Pain management: pain level controlled Vital Signs Assessment: post-procedure vital signs reviewed and stable Respiratory status: spontaneous breathing, nonlabored ventilation, respiratory function stable and patient connected to nasal cannula oxygen Cardiovascular status: blood pressure returned to baseline and stable Postop Assessment: no signs of nausea or vomiting Anesthetic complications: no    Last Vitals:  Filed Vitals:   08/08/15 1214 08/08/15 1229  BP: 135/94 128/83  Pulse: 63 59  Temp:    Resp: 16 19    Last Pain:  Filed Vitals:   08/08/15 1232  PainSc: 10-Worst pain ever                 Effie Berkshire

## 2015-08-08 NOTE — Anesthesia Preprocedure Evaluation (Addendum)
Anesthesia Evaluation  Patient identified by MRN, date of birth, ID band Patient awake    History of Anesthesia Complications (+) PONV and history of anesthetic complications  Airway Mallampati: II  TM Distance: >3 FB Neck ROM: Full    Dental  (+) Teeth Intact, Dental Advisory Given   Pulmonary sleep apnea and Continuous Positive Airway Pressure Ventilation ,    breath sounds clear to auscultation       Cardiovascular hypertension, Pt. on medications and Pt. on home beta blockers + CAD, + Past MI and +CHF   Rhythm:Regular Rate:Normal     Neuro/Psych CVA negative psych ROS   GI/Hepatic negative GI ROS, Neg liver ROS,   Endo/Other  Hypothyroidism   Renal/GU Renal InsufficiencyRenal disease  negative genitourinary   Musculoskeletal  (+) Arthritis ,   Abdominal   Peds negative pediatric ROS (+)  Hematology   Anesthesia Other Findings   Reproductive/Obstetrics negative OB ROS                           Lab Results  Component Value Date   WBC 6.9 08/07/2014   HGB 14.3 08/07/2014   HCT 43.2 08/07/2014   MCV 84.9 08/07/2014   PLT 197 08/07/2014   Lab Results  Component Value Date   CREATININE 1.18 08/19/2014   BUN 19 08/19/2014   NA 138 08/19/2014   K 4.2 08/19/2014   CL 103 08/19/2014   CO2 32 08/19/2014   Lab Results  Component Value Date   INR 0.97 07/17/2013   INR 1.04 03/02/2012   INR 1.09 01/23/2012   08/08/2015 EKG: normal EKG, normal sinus rhythm.   Anesthesia Physical Anesthesia Plan  ASA: III  Anesthesia Plan: General   Post-op Pain Management: GA combined w/ Regional for post-op pain   Induction: Intravenous  Airway Management Planned: LMA  Additional Equipment:   Intra-op Plan:   Post-operative Plan: Extubation in OR  Informed Consent: I have reviewed the patients History and Physical, chart, labs and discussed the procedure including the risks, benefits  and alternatives for the proposed anesthesia with the patient or authorized representative who has indicated his/her understanding and acceptance.   Dental advisory given  Plan Discussed with: CRNA  Anesthesia Plan Comments:        Anesthesia Quick Evaluation
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
## 2015-08-09 NOTE — Op Note (Signed)
NAMENOHLAN, BAUGUESS NO.:  [id number]  MEDICAL RECORD NO.:  [id number]  LOCATION:  [facility]                         FACILITY:  [facility]  PHYSICIAN:  Satira Anis. Doss Cybulski, M.D.DATE OF BIRTH:  [birth year]  DATE OF PROCEDURE: DATE OF DISCHARGE:  08/08/2015                              OPERATIVE REPORT   PREOPERATIVE DIAGNOSES: 1. Right index finger tophaceous gouty arthritis with synovitis and     joint effusion. 2. Extensor tendon loss of function secondary to acute gouty     tophaceous disease.  POSTOPERATIVE DIAGNOSES: 1. Right index finger tophaceous gouty arthritis with synovitis and     joint effusion. 2. Extensor tendon loss of function secondary to acute gouty     tophaceous disease.  PROCEDURE: 1. __________ right index finger. 2. Irrigation and debridement, right index finger skin, subcutaneous     tissue, tendon, bone, and associated soft tissue structures. 3. Extensor tendon tenolysis, tenosynovectomy radical in nature, right     index finger. 4. Arthrotomy, synovectomy with capsular release and capsulectomy,     right index finger PIP joint.  SURGEON:  Satira Anis. Amedeo Plenty, M.D.  ASSISTANT:  None.  COMPLICATION:  None.  DRAINS:  One.  INDICATIONS:  The patient is 70 year old male presents with the above- mentioned diagnosis.  I have counseled him regards risks and benefits of surgery and he desires to proceed the above-mentioned operative intervention.  All questions have been encouraged and answered preoperatively.  DESCRIPTION OF PROCEDURE:  The patient was seen by myself and Anesthesia, taken to operative suite, underwent smooth induction of general anesthesia.  Time-out was observed.  Preoperatively, he was given a block by Anesthesia Department, which was in excellent working fashion.  The patient tolerated this well.  Once this was complete, the patient then underwent a very careful and cautious approach to the extremity with a pre-scrub  with Hibiclens, followed by 10 minutes surgical Betadine scrub and paint, followed by final time-out being called.  Elevation of the arm ensued, tourniquet was insufflated.  A curvilinear incision was made dorsoulnar about the finger.  I made the incision so that the extensor tendon would be minimally exposed.  The patient's finger was notable for erythema and very thin skin dorsally due to the chronic nature of the disease and the condition.  Dissection was carried out very carefully.  I have maintained full- thickness flap just off the paratenon and elevated the flap.  Once this was complete, I took cultures aerobic, anaerobic, and atypical mycobacterial cultures.  This included portions of the tophaceous disease.  Following this, I then very carefully and cautiously performed extensor tenolysis tenosynovectomy, and removed the gouty impregnation from the tendon.  Following this, I performed arthrotomy synovectomy with capsular release and capsulectomy about the PIP joint.  This was radical in nature as well.  Both the tenolysis, tenosynovectomy, capsular release, capsulectomy, and joint synovectomy, arthrotomy required large amount of time.  I was very meticulous with curette, rongeur, knife blade, and scissors to remove all of the __________ near as possible.  The collateral ligaments were preserved and the lateral bands as well as central tendon was preserved.  Certainly, we were able  to debulk these beautifully and I did take pictures and gave the picture copies to the patient.  The patient had a 3 L of irrigation applied to the wound and tourniquet was deflated.  The wound was closed over a vessel loop drain with Prolene.  I will see him back Monday.  I am going to have him continue his Cipro 500 mg b.i.d. and his gouty regime including colchicine, allopurinol, and indomethacin.  I gave him Dilaudid for pain, which he has tolerated well in the past.  I have discussed him do's  and don'ts, etc.  His joint looks rather stable on x-ray, however, he had significant tophaceous impregnation about the joint, which was unfortunate.  I do feel that he has a high propensity towards gouty arthritic change given these findings, but nevertheless the debulking procedure today should go long way to try to preserve the tendon and the joint function.  These notes have been discussed and all questions have been encouraged and answered.  Should any problems arise, I will be immediately available. I have discussed all issues with his wife and showed her the pictures of the intraoperative findings.     Satira Anis. Amedeo Plenty, M.D.     [facility]  D:  08/08/2015  T:  08/09/2015  Job:  [id number]

## 2015-08-10 ENCOUNTER — Encounter (HOSPITAL_COMMUNITY): Payer: Self-pay | Admitting: Orthopedic Surgery

## 2015-08-10 DIAGNOSIS — M1A9XX1 Chronic gout, unspecified, with tophus (tophi): Secondary | ICD-10-CM | POA: Diagnosis not present

## 2015-08-10 DIAGNOSIS — Z4789 Encounter for other orthopedic aftercare: Secondary | ICD-10-CM | POA: Diagnosis not present

## 2015-08-10 DIAGNOSIS — M79644 Pain in right finger(s): Secondary | ICD-10-CM | POA: Diagnosis not present

## 2015-08-11 LAB — BODY FLUID CULTURE: Culture: NO GROWTH

## 2015-08-13 ENCOUNTER — Observation Stay
Admission: EM | Admit: 2015-08-13 | Discharge: 2015-08-15 | Disposition: A | Discharge status: Home / Self Care | Payer: Medicare Other | Attending: Internal Medicine | Admitting: Internal Medicine

## 2015-08-13 ENCOUNTER — Emergency Department: Payer: Medicare Other

## 2015-08-13 DIAGNOSIS — K5900 Constipation, unspecified: Secondary | ICD-10-CM | POA: Insufficient documentation

## 2015-08-13 DIAGNOSIS — I517 Cardiomegaly: Secondary | ICD-10-CM | POA: Insufficient documentation

## 2015-08-13 DIAGNOSIS — E039 Hypothyroidism, unspecified: Secondary | ICD-10-CM | POA: Diagnosis not present

## 2015-08-13 DIAGNOSIS — Z8249 Family history of ischemic heart disease and other diseases of the circulatory system: Secondary | ICD-10-CM | POA: Diagnosis not present

## 2015-08-13 DIAGNOSIS — Z981 Arthrodesis status: Secondary | ICD-10-CM | POA: Diagnosis not present

## 2015-08-13 DIAGNOSIS — Z8673 Personal history of transient ischemic attack (TIA), and cerebral infarction without residual deficits: Secondary | ICD-10-CM | POA: Diagnosis not present

## 2015-08-13 DIAGNOSIS — E785 Hyperlipidemia, unspecified: Secondary | ICD-10-CM | POA: Insufficient documentation

## 2015-08-13 DIAGNOSIS — Z91013 Allergy to seafood: Secondary | ICD-10-CM | POA: Insufficient documentation

## 2015-08-13 DIAGNOSIS — R1032 Left lower quadrant pain: Secondary | ICD-10-CM | POA: Diagnosis not present

## 2015-08-13 DIAGNOSIS — R1084 Generalized abdominal pain: Secondary | ICD-10-CM

## 2015-08-13 DIAGNOSIS — K625 Hemorrhage of anus and rectum: Principal | ICD-10-CM | POA: Insufficient documentation

## 2015-08-13 DIAGNOSIS — Z888 Allergy status to other drugs, medicaments and biological substances status: Secondary | ICD-10-CM | POA: Insufficient documentation

## 2015-08-13 DIAGNOSIS — Z9889 Other specified postprocedural states: Secondary | ICD-10-CM | POA: Diagnosis not present

## 2015-08-13 DIAGNOSIS — I509 Heart failure, unspecified: Secondary | ICD-10-CM | POA: Diagnosis not present

## 2015-08-13 DIAGNOSIS — M545 Low back pain: Secondary | ICD-10-CM | POA: Diagnosis not present

## 2015-08-13 DIAGNOSIS — I2510 Atherosclerotic heart disease of native coronary artery without angina pectoris: Secondary | ICD-10-CM | POA: Diagnosis not present

## 2015-08-13 DIAGNOSIS — R339 Retention of urine, unspecified: Secondary | ICD-10-CM | POA: Diagnosis not present

## 2015-08-13 DIAGNOSIS — F329 Major depressive disorder, single episode, unspecified: Secondary | ICD-10-CM | POA: Diagnosis not present

## 2015-08-13 DIAGNOSIS — I252 Old myocardial infarction: Secondary | ICD-10-CM | POA: Insufficient documentation

## 2015-08-13 DIAGNOSIS — R918 Other nonspecific abnormal finding of lung field: Secondary | ICD-10-CM | POA: Insufficient documentation

## 2015-08-13 DIAGNOSIS — N401 Enlarged prostate with lower urinary tract symptoms: Secondary | ICD-10-CM | POA: Insufficient documentation

## 2015-08-13 DIAGNOSIS — Z87442 Personal history of urinary calculi: Secondary | ICD-10-CM | POA: Diagnosis not present

## 2015-08-13 DIAGNOSIS — Z91030 Bee allergy status: Secondary | ICD-10-CM | POA: Insufficient documentation

## 2015-08-13 DIAGNOSIS — K922 Gastrointestinal hemorrhage, unspecified: Secondary | ICD-10-CM | POA: Diagnosis present

## 2015-08-13 DIAGNOSIS — M5136 Other intervertebral disc degeneration, lumbar region: Secondary | ICD-10-CM | POA: Insufficient documentation

## 2015-08-13 DIAGNOSIS — I10 Essential (primary) hypertension: Secondary | ICD-10-CM | POA: Diagnosis not present

## 2015-08-13 DIAGNOSIS — Z885 Allergy status to narcotic agent status: Secondary | ICD-10-CM | POA: Insufficient documentation

## 2015-08-13 DIAGNOSIS — R0602 Shortness of breath: Secondary | ICD-10-CM | POA: Insufficient documentation

## 2015-08-13 DIAGNOSIS — R112 Nausea with vomiting, unspecified: Secondary | ICD-10-CM | POA: Insufficient documentation

## 2015-08-13 DIAGNOSIS — G4733 Obstructive sleep apnea (adult) (pediatric): Secondary | ICD-10-CM | POA: Insufficient documentation

## 2015-08-13 DIAGNOSIS — I959 Hypotension, unspecified: Secondary | ICD-10-CM | POA: Diagnosis not present

## 2015-08-13 DIAGNOSIS — G8929 Other chronic pain: Secondary | ICD-10-CM | POA: Diagnosis not present

## 2015-08-13 DIAGNOSIS — R0989 Other specified symptoms and signs involving the circulatory and respiratory systems: Secondary | ICD-10-CM | POA: Insufficient documentation

## 2015-08-13 DIAGNOSIS — N200 Calculus of kidney: Secondary | ICD-10-CM | POA: Insufficient documentation

## 2015-08-13 DIAGNOSIS — M1A9XX1 Chronic gout, unspecified, with tophus (tophi): Secondary | ICD-10-CM | POA: Diagnosis not present

## 2015-08-13 LAB — CBC WITH DIFFERENTIAL/PLATELET
Basophils Absolute: 0 10*3/uL (ref 0–0.1)
Basophils Relative: 0 %
Eosinophils Absolute: 0.2 10*3/uL (ref 0–0.7)
Eosinophils Relative: 3 %
HCT: 47 % (ref 40.0–52.0)
Hemoglobin: 15.7 g/dL (ref 13.0–18.0)
Lymphocytes Relative: 14 %
Lymphs Abs: 1 10*3/uL (ref 1.0–3.6)
MCH: 27.9 pg (ref 26.0–34.0)
MCHC: 33.3 g/dL (ref 32.0–36.0)
MCV: 83.7 fL (ref 80.0–100.0)
Monocytes Absolute: 0.3 10*3/uL (ref 0.2–1.0)
Monocytes Relative: 5 %
Neutro Abs: 5.4 10*3/uL (ref 1.4–6.5)
Neutrophils Relative %: 78 %
Platelets: 207 10*3/uL (ref 150–440)
RBC: 5.61 MIL/uL (ref 4.40–5.90)
RDW: 14.3 % (ref 11.5–14.5)
WBC: 6.9 10*3/uL (ref 3.8–10.6)

## 2015-08-13 LAB — URINALYSIS COMPLETE WITH MICROSCOPIC (ARMC ONLY)
Bilirubin Urine: NEGATIVE
Glucose, UA: NEGATIVE mg/dL
Hgb urine dipstick: NEGATIVE
Ketones, ur: NEGATIVE mg/dL
Leukocytes, UA: NEGATIVE
Nitrite: NEGATIVE
Protein, ur: NEGATIVE mg/dL
RBC / HPF: NONE SEEN RBC/hpf (ref 0–5)
Specific Gravity, Urine: 1 — ABNORMAL LOW (ref 1.005–1.030)
Squamous Epithelial / LPF: NONE SEEN
WBC, UA: NONE SEEN WBC/hpf (ref 0–5)
pH: 5 (ref 5.0–8.0)

## 2015-08-13 LAB — ANAEROBIC CULTURE

## 2015-08-13 LAB — COMPREHENSIVE METABOLIC PANEL
ALT: 16 U/L — ABNORMAL LOW (ref 17–63)
AST: 19 U/L (ref 15–41)
Albumin: 4.6 g/dL (ref 3.5–5.0)
Alkaline Phosphatase: 60 U/L (ref 38–126)
Anion gap: 9 (ref 5–15)
BUN: 18 mg/dL (ref 6–20)
CO2: 28 mmol/L (ref 22–32)
Calcium: 9.7 mg/dL (ref 8.9–10.3)
Chloride: 99 mmol/L — ABNORMAL LOW (ref 101–111)
Creatinine, Ser: 1.09 mg/dL (ref 0.61–1.24)
GFR calc Af Amer: >60 mL/min (ref >60)
GFR calc non Af Amer: >60 mL/min (ref >60)
Glucose, Bld: 118 mg/dL — ABNORMAL HIGH (ref 65–99)
Potassium: 3.7 mmol/L (ref 3.5–5.1)
Sodium: 136 mmol/L (ref 135–145)
Total Bilirubin: 1.4 mg/dL — ABNORMAL HIGH (ref 0.3–1.2)
Total Protein: 8.1 g/dL (ref 6.5–8.1)

## 2015-08-13 LAB — LIPASE, BLOOD: Lipase: 19 U/L (ref 11–51)

## 2015-08-13 LAB — TROPONIN I: Troponin I: <0.03 ng/mL (ref <0.031)

## 2015-08-13 MED ORDER — HYDROMORPHONE HCL 1 MG/ML IJ SOLN
1.0000 mg | Freq: Once | INTRAMUSCULAR | Status: AC
  Administered 2015-08-13: 1 mg via INTRAVENOUS
  Filled 2015-08-13: qty 1

## 2015-08-13 MED ORDER — ONDANSETRON HCL 4 MG/2ML IJ SOLN
INTRAMUSCULAR | Status: AC
  Administered 2015-08-13: 4 mg via INTRAVENOUS
  Filled 2015-08-13: qty 2

## 2015-08-13 MED ORDER — HYDROMORPHONE HCL 1 MG/ML IJ SOLN
1.0000 mg | Freq: Once | INTRAMUSCULAR | Status: AC
  Administered 2015-08-13: 1 mg via INTRAVENOUS

## 2015-08-13 MED ORDER — IOHEXOL 240 MG/ML SOLN
25.0000 mL | Freq: Once | INTRAMUSCULAR | Status: AC | PRN
  Administered 2015-08-13: 25 mL via ORAL

## 2015-08-13 MED ORDER — SODIUM CHLORIDE 0.9 % IV BOLUS (SEPSIS)
500.0000 mL | Freq: Once | INTRAVENOUS | Status: AC
  Administered 2015-08-14: 500 mL via INTRAVENOUS

## 2015-08-13 MED ORDER — HYDROMORPHONE HCL 1 MG/ML IJ SOLN
INTRAMUSCULAR | Status: AC
  Administered 2015-08-13: 1 mg via INTRAVENOUS
  Filled 2015-08-13: qty 1

## 2015-08-13 MED ORDER — ONDANSETRON HCL 4 MG/2ML IJ SOLN
4.0000 mg | Freq: Once | INTRAMUSCULAR | Status: AC
  Administered 2015-08-13: 4 mg via INTRAVENOUS
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
## 2015-08-13 NOTE — ED Notes (Signed)
Pt presents via POV c/o SOB, abdominal pain, nausea, vomiting, and bloody stools with clots.

## 2015-08-13 NOTE — ED Notes (Signed)
Pt O2 stats at 88%, placed on 2Ls, MD made aware.

## 2015-08-14 ENCOUNTER — Encounter: Payer: Self-pay | Admitting: Radiology

## 2015-08-14 ENCOUNTER — Emergency Department: Payer: Medicare Other

## 2015-08-14 DIAGNOSIS — N200 Calculus of kidney: Secondary | ICD-10-CM | POA: Diagnosis not present

## 2015-08-14 DIAGNOSIS — K625 Hemorrhage of anus and rectum: Secondary | ICD-10-CM | POA: Diagnosis not present

## 2015-08-14 DIAGNOSIS — I10 Essential (primary) hypertension: Secondary | ICD-10-CM | POA: Diagnosis not present

## 2015-08-14 DIAGNOSIS — R339 Retention of urine, unspecified: Secondary | ICD-10-CM | POA: Diagnosis not present

## 2015-08-14 DIAGNOSIS — R1030 Lower abdominal pain, unspecified: Secondary | ICD-10-CM | POA: Diagnosis not present

## 2015-08-14 LAB — BASIC METABOLIC PANEL
Anion gap: 4 — ABNORMAL LOW (ref 5–15)
BUN: 17 mg/dL (ref 6–20)
CO2: 29 mmol/L (ref 22–32)
Calcium: 8.9 mg/dL (ref 8.9–10.3)
Chloride: 101 mmol/L (ref 101–111)
Creatinine, Ser: 1.07 mg/dL (ref 0.61–1.24)
GFR calc Af Amer: >60 mL/min (ref >60)
GFR calc non Af Amer: >60 mL/min (ref >60)
Glucose, Bld: 130 mg/dL — ABNORMAL HIGH (ref 65–99)
Potassium: 4.1 mmol/L (ref 3.5–5.1)
Sodium: 134 mmol/L — ABNORMAL LOW (ref 135–145)

## 2015-08-14 LAB — TYPE AND SCREEN
ABO/RH(D): O POS
Antibody Screen: NEGATIVE

## 2015-08-14 LAB — HEMOGLOBIN AND HEMATOCRIT, BLOOD
HCT: 41.3 % (ref 40.0–52.0)
HCT: 40.8 % (ref 40.0–52.0)
HCT: 38.3 % — ABNORMAL LOW (ref 40.0–52.0)
Hemoglobin: 13.7 g/dL (ref 13.0–18.0)
Hemoglobin: 13.4 g/dL (ref 13.0–18.0)
Hemoglobin: 12.8 g/dL — ABNORMAL LOW (ref 13.0–18.0)

## 2015-08-14 LAB — ABO/RH: ABO/RH(D): O POS

## 2015-08-14 MED ORDER — TAMSULOSIN HCL 0.4 MG PO CAPS
0.4000 mg | ORAL_CAPSULE | Freq: Every day | ORAL | Status: DC
  Administered 2015-08-14 – 2015-08-15 (×2): 0.4 mg via ORAL
  Filled 2015-08-14 (×2): qty 1

## 2015-08-14 MED ORDER — ACETAMINOPHEN 325 MG PO TABS
650.0000 mg | ORAL_TABLET | Freq: Four times a day (QID) | ORAL | Status: DC | PRN
Start: 2015-08-14 — End: 2015-08-15

## 2015-08-14 MED ORDER — SODIUM CHLORIDE 0.9 % IV BOLUS (SEPSIS)
500.0000 mL | Freq: Once | INTRAVENOUS | Status: AC
  Administered 2015-08-14: 500 mL via INTRAVENOUS

## 2015-08-14 MED ORDER — BISACODYL 5 MG PO TBEC
5.0000 mg | DELAYED_RELEASE_TABLET | Freq: Every day | ORAL | Status: DC | PRN
  Filled 2015-08-14: qty 1

## 2015-08-14 MED ORDER — FLEET ENEMA 7-19 GM/118ML RE ENEM
1.0000 | ENEMA | Freq: Once | RECTAL | Status: AC
  Administered 2015-08-14: 1 via RECTAL
  Filled 2015-08-14: qty 1

## 2015-08-14 MED ORDER — HYDROMORPHONE HCL 1 MG/ML IJ SOLN
INTRAMUSCULAR | Status: AC
  Filled 2015-08-14: qty 1

## 2015-08-14 MED ORDER — ENOXAPARIN SODIUM 40 MG/0.4ML ~~LOC~~ SOLN
40.0000 mg | SUBCUTANEOUS | Status: DC
  Administered 2015-08-14: 40 mg via SUBCUTANEOUS
  Filled 2015-08-14: qty 0.4

## 2015-08-14 MED ORDER — ESCITALOPRAM OXALATE 10 MG PO TABS
10.0000 mg | ORAL_TABLET | Freq: Every day | ORAL | Status: DC
  Administered 2015-08-14 – 2015-08-15 (×2): 10 mg via ORAL
  Filled 2015-08-14 (×2): qty 1

## 2015-08-14 MED ORDER — SODIUM CHLORIDE 0.9% FLUSH: 3.0000 mL | Freq: Two times a day (BID) | INTRAVENOUS | Status: DC

## 2015-08-14 MED ORDER — SODIUM CHLORIDE 0.9% FLUSH: 3.0000 mL | INTRAVENOUS | Status: DC | PRN

## 2015-08-14 MED ORDER — HYDROMORPHONE HCL 1 MG/ML IJ SOLN
1.0000 mg | INTRAMUSCULAR | Status: DC | PRN
  Administered 2015-08-14 – 2015-08-15 (×2): 1 mg via INTRAVENOUS
  Filled 2015-08-14 (×2): qty 1

## 2015-08-14 MED ORDER — COLCHICINE 0.6 MG PO TABS: 0.6000 mg | ORAL_TABLET | Freq: Three times a day (TID) | ORAL | Status: DC

## 2015-08-14 MED ORDER — HYDROMORPHONE HCL 1 MG/ML IJ SOLN
1.0000 mg | Freq: Once | INTRAMUSCULAR | Status: AC
  Administered 2015-08-14: 1 mg via INTRAVENOUS

## 2015-08-14 MED ORDER — ONDANSETRON HCL 4 MG PO TABS: 4.0000 mg | ORAL_TABLET | Freq: Four times a day (QID) | ORAL | Status: DC | PRN

## 2015-08-14 MED ORDER — ATORVASTATIN CALCIUM 20 MG PO TABS: 20.0000 mg | ORAL_TABLET | Freq: Every day | ORAL | Status: DC

## 2015-08-14 MED ORDER — LEVOTHYROXINE SODIUM 100 MCG PO TABS
200.0000 ug | ORAL_TABLET | Freq: Every day | ORAL | Status: DC
  Administered 2015-08-14 – 2015-08-15 (×2): 200 ug via ORAL
  Filled 2015-08-14 (×5): qty 2

## 2015-08-14 MED ORDER — ACETAMINOPHEN 650 MG RE SUPP
650.0000 mg | Freq: Four times a day (QID) | RECTAL | Status: DC | PRN
Start: 2015-08-14 — End: 2015-08-15

## 2015-08-14 MED ORDER — IOHEXOL 300 MG/ML  SOLN
125.0000 mL | Freq: Once | INTRAMUSCULAR | Status: AC | PRN
  Administered 2015-08-14: 125 mL via INTRAVENOUS

## 2015-08-14 MED ORDER — ATORVASTATIN CALCIUM 20 MG PO TABS
20.0000 mg | ORAL_TABLET | Freq: Every day | ORAL | Status: DC
  Administered 2015-08-14: 20 mg via ORAL
  Filled 2015-08-14: qty 1

## 2015-08-14 MED ORDER — LOSARTAN POTASSIUM 50 MG PO TABS
100.0000 mg | ORAL_TABLET | Freq: Every day | ORAL | Status: DC
  Administered 2015-08-14: 100 mg via ORAL
  Filled 2015-08-14: qty 2

## 2015-08-14 MED ORDER — CARVEDILOL 12.5 MG PO TABS: 12.5000 mg | ORAL_TABLET | Freq: Two times a day (BID) | ORAL | Status: DC

## 2015-08-14 MED ORDER — COLCHICINE 0.6 MG PO TABS
0.6000 mg | ORAL_TABLET | Freq: Two times a day (BID) | ORAL | Status: DC
  Administered 2015-08-14 – 2015-08-15 (×3): 0.6 mg via ORAL
  Filled 2015-08-14 (×3): qty 1

## 2015-08-14 MED ORDER — ONDANSETRON HCL 4 MG/2ML IJ SOLN: 4.0000 mg | Freq: Four times a day (QID) | INTRAMUSCULAR | Status: DC | PRN

## 2015-08-14 MED ORDER — SODIUM CHLORIDE 0.9 % IV SOLN
INTRAVENOUS | Status: DC
  Administered 2015-08-14 – 2015-08-15 (×2): via INTRAVENOUS

## 2015-08-14 MED ORDER — CARVEDILOL 12.5 MG PO TABS
12.5000 mg | ORAL_TABLET | Freq: Two times a day (BID) | ORAL | Status: DC
  Administered 2015-08-14: 12.5 mg via ORAL
  Filled 2015-08-14: qty 1

## 2015-08-14 NOTE — Progress Notes (Signed)
[place] [facility] [place] NAME: Dennis [name]    MR#:  [id number]  DATE OF BIRTH:  Jul 24, 1945  SUBJECTIVE:   Condition is here due to rectal bleeding and also urinary retention. Status post Foley in the ER. No further episodes of rectal bleeding this morning. He is constipated.  REVIEW OF SYSTEMS:    Review of Systems  Constitutional: Negative for fever and chills.  HENT: Negative for congestion and tinnitus.   Eyes: Negative for blurred vision and double vision.  Respiratory: Negative for cough, shortness of breath and wheezing.   Cardiovascular: Negative for chest pain, orthopnea and PND.  Gastrointestinal: Positive for constipation and blood in stool. Negative for nausea, vomiting, abdominal pain and diarrhea.  Genitourinary: Negative for dysuria and hematuria.  Neurological: Negative for dizziness, sensory change and focal weakness.  All other systems reviewed and are negative.   Nutrition: Full liquid Tolerating Diet: Yes Tolerating PT:  Await Eval.    DRUG ALLERGIES:   Allergies  Allergen Reactions  . Bee Venom Anaphylaxis  . Shrimp [Shellfish Allergy] Anaphylaxis    "just shrimp"  . Wasp Venom Anaphylaxis  . Stadol [Butorphanol] Other (See Comments)    respiratory  distress    VITALS:  Blood pressure 100/60, pulse 55, temperature 97.7 F (36.5 C), temperature source Oral, resp. rate 20, height 6\' 7"  (2.007 m), weight 155.856 kg (343 lb 9.6 oz), SpO2 93 %.  PHYSICAL EXAMINATION:   Physical Exam  GENERAL:  70 y.o.-year-old obese patient lying in the bed with no acute distress.  EYES: Pupils equal, round, reactive to light and accommodation. No scleral icterus. Extraocular muscles intact.  HEENT: Head atraumatic, normocephalic. Oropharynx and nasopharynx clear.  NECK:  Supple, no jugular venous distention. No thyroid enlargement, no tenderness.  LUNGS: Normal breath sounds bilaterally, no wheezing, rales, rhonchi. No  use of accessory muscles of respiration.  CARDIOVASCULAR: S1, S2 normal. No murmurs, rubs, or gallops.  ABDOMEN: Soft, nontender, nondistended. Bowel sounds present. No organomegaly or mass.  EXTREMITIES: No cyanosis, clubbing or edema b/l.   Right hand (inder finger) dressing in place from recent surgery due to tophaceous gout.  NEUROLOGIC: Cranial nerves II through XII are intact. No focal Motor or sensory deficits b/l.   PSYCHIATRIC: The patient is alert and oriented x 3.  SKIN: No obvious rash, lesion, or ulcer.   Foley cath in place with clear yellow urine draining.    LABORATORY PANEL:   CBC  Recent Labs Lab 08/13/15 2248  08/14/15 1103  WBC 6.9  --   --   HGB 15.7  < > 13.4  HCT 47.0  < > 40.8  PLT 207  --   --   < > = values in this interval not displayed. ------------------------------------------------------------------------------------------------------------------  Chemistries   Recent Labs Lab 08/13/15 2248 08/14/15 0510  NA 136 134*  K 3.7 4.1  CL 99* 101  CO2 28 29  GLUCOSE 118* 130*  BUN 18 17  CREATININE 1.09 1.07  CALCIUM 9.7 8.9  AST 19  --   ALT 16*  --   ALKPHOS 60  --   BILITOT 1.4*  --    ------------------------------------------------------------------------------------------------------------------  Cardiac Enzymes  Recent Labs Lab 08/13/15 2248  TROPONINI <0.03   ------------------------------------------------------------------------------------------------------------------  RADIOLOGY:  Ct Abdomen Pelvis W Contrast  08/14/2015  CLINICAL DATA:  Abdominal pain and rectal bleeding. EXAM: CT ABDOMEN AND PELVIS WITH CONTRAST TECHNIQUE: Multidetector CT imaging of the abdomen and pelvis was  performed using the standard protocol following bolus administration of intravenous contrast. CONTRAST:  196mL OMNIPAQUE IOHEXOL 300 MG/ML  SOLN COMPARISON:  Or 09/2015 FINDINGS: Lower chest and abdominal wall: Fat expansion of the bilateral inguinal  canal. Partly visualized nodularity in the lower left chest wall anteriorly is likely gynecomastia but is partly visualized. Hepatobiliary: No focal liver abnormality.No evidence of biliary obstruction or stone. Pancreas: Unremarkable. Spleen: Unremarkable. Adrenals/Urinary Tract: Negative adrenals. Punctate stone in the upper pole left kidney. No hydronephrosis or ureteral calculus. Bladder is decompressed by Foley catheter. Reproductive:Prostatectomy. No suspicious nodularity or pelvic lymph nodes Stomach/Bowel: No obstruction or inflammatory wall thickening. Moderate volume of stool. There are proximal colonic fluid levels which could be within normal limits. No appendicitis. There is mild fat edema around the rectum which is chronic and not associated with wall thickening. This is likely post treatment related. No specific explanation for rectal bleeding. Vascular/Lymphatic: No acute vascular abnormality. No mass or adenopathy. Peritoneal: No ascites or pneumoperitoneum. Musculoskeletal: L2-3 discectomy with solid bony fusion. L4-5 discectomy with no solid bony fusion. No acute osseous finding. IMPRESSION: 1. No acute finding or specific explanation for rectal bleeding. 2. Left nephrolithiasis. 3. Moderate stool volume. Electronically Signed   By: Monte Fantasia M.D.   On: 08/14/2015 01:12   Dg Chest Portable 1 View  08/13/2015  CLINICAL DATA:  Shortness of breath, abdominal pain, nausea, vomiting and bloody stools. History of hypertension. EXAM: PORTABLE CHEST 1 VIEW COMPARISON:  Chest radiograph August 05, 2014 FINDINGS: Cardiac silhouette appears moderately enlarged unchanged, mediastinal silhouette is unremarkable for this low inspiratory portable examination with crowded, mildly engorged vascular markings. No pleural effusion or focal consolidation. Prominent pleural fat on the RIGHT. No pneumothorax. Large body habitus. Osseous structure nonsuspicious. IMPRESSION: Stable cardiomegaly, mild pulmonary  vascular congestion. Electronically Signed   By: Elon Alas M.D.   On: 08/13/2015 23:26     ASSESSMENT AND PLAN:   70 year old male with past medical history of hypertension, chronic back pain, history of gout, Obstructive sleep apnea on CPAP, history of previous CVA, coronary artery disease, history of CHF with presented to the hospital with rectal bleeding also urinary retention.  #1 rectal bleeding-I suspect this is probably hemorrhoidal in nature. Patient has been constipated and his CT scan of the abdomen pelvis shows moderate stool burden. -Patient was given an enema and had good response and had a large bowel movement. -Seen by gastroenterology and no plans for acute intervention presently. Hemoglobin stable.  #2 urinary retention-patient has a history of BPH and has had a TURP in the past. -Status post Foley in the emergency room which has been removed now. I have started on Flomax. -Resolved and continue follow-up with urology as an outpatient.  #3 gout-patient had tophaceous gout which was removed from his index finger on his right hand. -Continue colchicine.  #4 hyperlipidemia-continue atorvastatin.  #5 hypotension-patient developed some relative hypotension shortly after his large bowel movement. I suspect this was a vasovagal response. He has been given IV fluid bolus and hemodynamics have improved. -We'll follow blood pressure. Hold antihypertensives for now.  #6 depression-continue Lexapro.  #7 hypothyroidism-continue Synthroid.  All the records are reviewed and case discussed with Care Management/Social Workerr. Management plans discussed with the patient, family and they are in agreement.  CODE STATUS: Full  DVT Prophylaxis: Lovenox  TOTAL TIME TAKING CARE OF THIS PATIENT: 30 minutes.   POSSIBLE D/C IN 1-2 DAYS, DEPENDING ON CLINICAL CONDITION.   Henreitta Leber M.D on 08/14/2015 at  3:53 PM  Between 7am to 6pm - Pager - [phone]  After 6pm go to  [email or web address] - password [email or web address] [facility]  Office  [phone]  CC: Primary care physician; Wilhemena Durie, MD

## 2015-08-14 NOTE — Progress Notes (Signed)
Patient received fleet enema. While in the bathroom having a BM patient stated, "I feel like I'm going to pass out." Got patient back to bed. Patient BP 86/58 HR 52, patient O2 sats 88% on room air. Applied oxygen at 2L via nasal cannula. O2 sats currently 95%. Per Dr. Verdell Carmine okay to give 534mL bolus of normal saline. Patient is lethargic, oriented X4. Will continue to monitor patient. Horton Finer

## 2015-08-14 NOTE — Care Management (Signed)
Place in observation due to rectal bleeding and urinary retention.  hgb has remained stable.  Has been seen by GI. Anticipate discharge within 24 hour observation guidelines

## 2015-08-14 NOTE — ED Provider Notes (Signed)
-----------------------------------------   12:00 AM on 08/14/2015 -----------------------------------------   Blood pressure 147/98, pulse 74, temperature 97.8 F (36.6 C), temperature source Axillary, resp. rate 26, height 6\' 7"  (2.007 m), weight 154.223 kg, SpO2 100 %.  Assuming care from Dr. Edd Fabian.  In short, Dennis ZEMAITIS Sr. is a 70 y.o. male with a chief complaint of Shortness of Breath .  Refer to the original H&P for additional details.  The current plan of care is to follow-up CT scan and admit appropriately based on the results but concern for heavy rectal bleeding.  ----------------------------------------- 1:45 AM on 08/14/2015 -----------------------------------------  CT scan unremarkable.  Discussed with hospitalist for admission for rectal bleeding in addition to the patient's O2 sats of 86% on room air, now requiring 2 L.  Dr. Jannifer Franklin is aware of both issues and will admit.  Ct Abdomen Pelvis W Contrast  08/14/2015  CLINICAL DATA:  Abdominal pain and rectal bleeding. EXAM: CT ABDOMEN AND PELVIS WITH CONTRAST TECHNIQUE: Multidetector CT imaging of the abdomen and pelvis was performed using the standard protocol following bolus administration of intravenous contrast. CONTRAST:  152mL OMNIPAQUE IOHEXOL 300 MG/ML  SOLN COMPARISON:  Or 09/2015 FINDINGS: Lower chest and abdominal wall: Fat expansion of the bilateral inguinal canal. Partly visualized nodularity in the lower left chest wall anteriorly is likely gynecomastia but is partly visualized. Hepatobiliary: No focal liver abnormality.No evidence of biliary obstruction or stone. Pancreas: Unremarkable. Spleen: Unremarkable. Adrenals/Urinary Tract: Negative adrenals. Punctate stone in the upper pole left kidney. No hydronephrosis or ureteral calculus. Bladder is decompressed by Foley catheter. Reproductive:Prostatectomy. No suspicious nodularity or pelvic lymph nodes Stomach/Bowel: No obstruction or inflammatory wall thickening.  Moderate volume of stool. There are proximal colonic fluid levels which could be within normal limits. No appendicitis. There is mild fat edema around the rectum which is chronic and not associated with wall thickening. This is likely post treatment related. No specific explanation for rectal bleeding. Vascular/Lymphatic: No acute vascular abnormality. No mass or adenopathy. Peritoneal: No ascites or pneumoperitoneum. Musculoskeletal: L2-3 discectomy with solid bony fusion. L4-5 discectomy with no solid bony fusion. No acute osseous finding. IMPRESSION: 1. No acute finding or specific explanation for rectal bleeding. 2. Left nephrolithiasis. 3. Moderate stool volume. Electronically Signed   By: Monte Fantasia M.D.   On: 08/14/2015 01:12   Dg Chest Portable 1 View  08/13/2015  CLINICAL DATA:  Shortness of breath, abdominal pain, nausea, vomiting and bloody stools. History of hypertension. EXAM: PORTABLE CHEST 1 VIEW COMPARISON:  Chest radiograph August 05, 2014 FINDINGS: Cardiac silhouette appears moderately enlarged unchanged, mediastinal silhouette is unremarkable for this low inspiratory portable examination with crowded, mildly engorged vascular markings. No pleural effusion or focal consolidation. Prominent pleural fat on the RIGHT. No pneumothorax. Large body habitus. Osseous structure nonsuspicious. IMPRESSION: Stable cardiomegaly, mild pulmonary vascular congestion. Electronically Signed   By: Elon Alas M.D.   On: 08/13/2015 23:26     Hinda Kehr, MD 08/14/15 [phone]
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
## 2015-08-14 NOTE — ED Notes (Signed)
Foley catheter not in place upon inspection. Pt in agreeance to have new foley in place. Dr. Edd Fabian notified.

## 2015-08-14 NOTE — Progress Notes (Signed)
Pt arrived on unit. VSS.  Dennis [name]   
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
## 2015-08-14 NOTE — Progress Notes (Addendum)
Patient is being transferred to room 205. No acute distress noted. Dilaudid 1 mg PRN administered for c/o right rib/flank pain. Pain med effective on re-assessment.  Patient care is transferred to [place], report was given to her as well. Left a message for his wife to call back to update her for the floor change.

## 2015-08-15 DIAGNOSIS — K625 Hemorrhage of anus and rectum: Secondary | ICD-10-CM | POA: Diagnosis not present

## 2015-08-15 DIAGNOSIS — I10 Essential (primary) hypertension: Secondary | ICD-10-CM | POA: Diagnosis not present

## 2015-08-15 DIAGNOSIS — R339 Retention of urine, unspecified: Secondary | ICD-10-CM | POA: Diagnosis not present

## 2015-08-15 DIAGNOSIS — R1030 Lower abdominal pain, unspecified: Secondary | ICD-10-CM | POA: Diagnosis not present

## 2015-08-15 LAB — HEMOGLOBIN: Hemoglobin: 12.8 g/dL — ABNORMAL LOW (ref 13.0–18.0)

## 2015-08-15 MED ORDER — POLYETHYLENE GLYCOL 3350 17 G PO PACK: 17.0000 g | PACK | Freq: Every day | ORAL | Status: DC | PRN

## 2015-08-15 NOTE — Discharge Summary (Signed)
[facility] NAME: Dennis [name]    MR#:  [id number]  DATE OF BIRTH:  October 27, 1955  DATE OF ADMISSION:  08/13/2015 ADMITTING PHYSICIAN: Saundra Shelling, MD  DATE OF DISCHARGE: 08/15/2015 12:46 PM  PRIMARY CARE PHYSICIAN: Wilhemena Durie, MD    ADMISSION DIAGNOSIS:  Acute lower GI bleeding [K92.2] Generalized abdominal pain [R10.84]  DISCHARGE DIAGNOSIS:  Principal Problem:   Rectal bleed Active Problems:   Rectal bleeding   SECONDARY DIAGNOSIS:   Past Medical History  Diagnosis Date  . Complication of anesthesia     Sometimes has N&V /w anesth.   Marland Kitchen PONV (postoperative nausea and vomiting)   . Hypertension   . Hypothyroidism   . Arthritis     low back - DDD  . History of chronic bronchitis   . Chronic lower back pain     "from [place] 2007"  . History of gout   . Hypocholesteremia   . Elevated PSA   . Myocardial infarction [facility]) 2001    2001- cardiac cath., cardiac clearanece note dr Otho Perl 05-14-13 on chart, stress test results 02-21-12 on chart  . Pneumonia 2000's and 2013  . OSA on CPAP     cpap setting of 10  . Stroke [facility]) 2004    "right brain stem; no residual "  . Coronary artery disease     Cath 2001  . [facility] spotted fever   . Kidney stone   . CHF (congestive heart failure) ([place])     HOSPITAL COURSE:   1. Severe constipation with rectal bleeding. Patient was seen in consultation by GI. No further workup recommended by GI. Medications for constipation were given. Patient had large bowel movement. And feels a lot better no abdominal pain at this point. Recommend MiraLAX on a daily basis and watch the amount of pain medications here and 2. Urinary retention- patient urinating a lot better once constipation resolved. 3. Relative hypotension hold blood pressure medications at this time follow up with PMD one week to check blood pressure and possibly restart medication. 4. 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 Continue colchicine and follow-up as outpatient 5. Hyperlipidemia continue atorvastatin 6. Depression continue Lexapro 7. Hypothyroidism unspecified continue Synthroid  DISCHARGE CONDITIONS:   Satisfactory  CONSULTS OBTAINED:  Treatment Team:  Lucilla Lame, MD  DRUG ALLERGIES:   Allergies  Allergen Reactions  . Bee Venom Anaphylaxis  . Shrimp [Shellfish Allergy] Anaphylaxis    "just shrimp"  . Wasp Venom Anaphylaxis  . Stadol [Butorphanol] Other (See Comments)    respiratory  distress    DISCHARGE MEDICATIONS:   Discharge Medication List as of 08/15/2015 10:55 AM    START taking these medications   Details  polyethylene glycol (MIRALAX / GLYCOLAX) packet Take 17 g by mouth daily as needed for mild constipation or moderate constipation., Starting 08/15/2015, Until Discontinued, Print      CONTINUE these medications which have NOT CHANGED   Details  atorvastatin (LIPITOR) 20 MG tablet Take 1 tablet (20 mg total) by mouth daily., Starting 12/02/2014, Until Discontinued, Normal    colchicine 0.6 MG tablet Take 0.6 mg by mouth 3 (three) times daily., Until Discontinued, Historical Med    EPINEPHrine (EPIPEN) 0.3 mg/0.3 mL SOAJ injection Inject 0.3 mg into the muscle as needed (allergic reaction). , Until Discontinued, Historical Med    escitalopram (LEXAPRO) 10 MG tablet Take 10 mg by mouth daily. , Starting 08/14/2014, Until  Discontinued, Historical Med    HYDROmorphone (DILAUDID) 2 MG tablet Take 2 tablets (4 mg total) by mouth every 4 (four) hours as needed for moderate pain or severe pain., Starting 08/08/2015, Until Discontinued, Print    indomethacin (INDOCIN) 50 MG capsule Take 50 mg by mouth 2 (two) times daily with a meal., Until Discontinued, Historical Med    levothyroxine (SYNTHROID, LEVOTHROID) 200 MCG tablet Take 200 mcg by mouth daily before breakfast., Until Discontinued, Historical Med    oxyCODONE-acetaminophen  (PERCOCET/ROXICET) 5-325 MG tablet Take 1 tablet by mouth every 4 (four) hours as needed for severe pain., Until Discontinued, Historical Med    potassium chloride SA (K-DUR,KLOR-CON) 20 MEQ tablet Take 1 tablet (20 mEq total) by mouth daily., Starting 04/25/2014, Until Discontinued, Normal      STOP taking these medications     carvedilol (COREG) 12.5 MG tablet      ciprofloxacin (CIPRO) 500 MG tablet      losartan (COZAAR) 100 MG tablet      spironolactone (ALDACTONE) 100 MG tablet      fluconazole (DIFLUCAN) 150 MG tablet      levofloxacin (LEVAQUIN) 750 MG tablet          DISCHARGE INSTRUCTIONS:   Follow-up PMD one week  If you experience worsening of your admission symptoms, develop shortness of breath, life threatening emergency, suicidal or homicidal thoughts you must seek medical attention immediately by calling 911 or calling your MD immediately  if symptoms less severe.  You Must read complete instructions/literature along with all the possible adverse reactions/side effects for all the Medicines you take and that have been prescribed to you. Take any new Medicines after you have completely understood and accept all the possible adverse reactions/side effects.   Please note  You were cared for by a hospitalist during your hospital stay. If you have any questions about your discharge medications or the care you received while you were in the hospital after you are discharged, you can call the unit and asked to speak with the hospitalist on call if the hospitalist that took care of you is not available. Once you are discharged, your primary care physician will handle any further medical issues. Please note that NO REFILLS for any discharge medications will be authorized once you are discharged, as it is imperative that you return to your primary care physician (or establish a relationship with a primary care physician if you do not have one) for your aftercare needs so that  they can reassess your need for medications and monitor your lab values.    Today   CHIEF COMPLAINT:   Chief Complaint  Patient presents with  . Shortness of Breath    HISTORY OF PRESENT ILLNESS:  Dennis [name]  is a 70 y.o. male presented with constipation and bloody in bowel movement   VITAL SIGNS:  Blood pressure 125/81, pulse 58, temperature 97.8 F (36.6 C), temperature source Oral, resp. rate 20, height 6\' 7"  (2.007 m), weight 155.856 kg (343 lb 9.6 oz), SpO2 97 %.  I/O:   Intake/Output Summary (Last 24 hours) at 08/15/15 1720 Last data filed at 08/15/15 1137  Gross per 24 hour  Intake 3767.81 ml  Output   2600 ml  Net 1167.81 ml    PHYSICAL EXAMINATION:  GENERAL:  70 y.o.-year-old patient lying in the bed with no acute distress.  EYES: Pupils equal, round, reactive to light and accommodation. No scleral icterus. Extraocular muscles intact.  HEENT: Head atraumatic, normocephalic. Oropharynx  and nasopharynx clear.  NECK:  Supple, no jugular venous distention. No thyroid enlargement, no tenderness.  LUNGS: Normal breath sounds bilaterally, no wheezing, rales,rhonchi or crepitation. No use of accessory muscles of respiration.  CARDIOVASCULAR: S1, S2 normal. No murmurs, rubs, or gallops.  ABDOMEN: Soft, non-tender, non-distended. Bowel sounds present. No organomegaly or mass.  EXTREMITIES: No pedal edema, cyanosis, or clubbing.  NEUROLOGIC: Cranial nerves II through XII are intact. Muscle strength 5/5 in all extremities. Sensation intact. Gait not checked.  PSYCHIATRIC: The patient is alert and oriented x 3.  SKIN: No obvious rash, lesion, or ulcer.   DATA REVIEW:   CBC  Recent Labs Lab 08/13/15 2248  08/14/15 1540 08/15/15 0454  WBC 6.9  --   --   --   HGB 15.7  < > 12.8* 12.8*  HCT 47.0  < > 38.3*  --   PLT 207  --   --   --   < > = values in this interval not displayed.  Chemistries   Recent Labs Lab 08/13/15 2248 08/14/15 0510  NA 136 134*  K 3.7  4.1  CL 99* 101  CO2 28 29  GLUCOSE 118* 130*  BUN 18 17  CREATININE 1.09 1.07  CALCIUM 9.7 8.9  AST 19  --   ALT 16*  --   ALKPHOS 60  --   BILITOT 1.4*  --     Cardiac Enzymes  Recent Labs Lab 08/13/15 2248  TROPONINI <0.03    Microbiology Results  Results for orders placed or performed during the hospital encounter of 08/08/15  Anaerobic culture     Status: None   Collection Time: 08/08/15 11:08 AM  Result Value Ref Range Status   Specimen Description FLUID SYNOVIAL  Final   Special Requests RIGHT INDEX FINGER POF CIPRO  Final   Gram Stain   Final    RARE WBC PRESENT, PREDOMINANTLY MONONUCLEAR NO ORGANISMS SEEN    Culture NO ANAEROBES ISOLATED  Final   Report Status 08/13/2015 FINAL  Final  Body fluid culture     Status: None   Collection Time: 08/08/15 11:08 AM  Result Value Ref Range Status   Specimen Description FLUID SYNOVIAL  Final   Special Requests RIGHT INDEX FINGER POF CIPRO  Final   Gram Stain   Final    RARE WBC PRESENT, PREDOMINANTLY MONONUCLEAR NO ORGANISMS SEEN    Culture NO GROWTH 3 DAYS  Final   Report Status 08/11/2015 FINAL  Final  AFB culture with smear (NOT at [facility])     Status: None (Preliminary result)   Collection Time: 08/08/15 11:08 AM  Result Value Ref Range Status   Specimen Description FLUID SYNOVIAL  Final   Special Requests RIGHT INDEX FINGER POF CIPRO  Final   Acid Fast Smear   Final    NO ACID FAST BACILLI SEEN Performed at [facility]    Culture   Final    CULTURE WILL BE EXAMINED FOR 6 WEEKS BEFORE ISSUING A FINAL REPORT Performed at [facility]    Report Status PENDING  Incomplete    RADIOLOGY:  Ct Abdomen Pelvis W Contrast  08/14/2015  CLINICAL DATA:  Abdominal pain and rectal bleeding. EXAM: CT ABDOMEN AND PELVIS WITH CONTRAST TECHNIQUE: Multidetector CT imaging of the abdomen and pelvis was performed using the standard protocol following bolus administration of intravenous contrast. CONTRAST:   164mL OMNIPAQUE IOHEXOL 300 MG/ML  SOLN COMPARISON:  Or 09/2015 FINDINGS: Lower chest and abdominal wall: Fat  expansion of the bilateral inguinal canal. Partly visualized nodularity in the lower left chest wall anteriorly is likely gynecomastia but is partly visualized. Hepatobiliary: No focal liver abnormality.No evidence of biliary obstruction or stone. Pancreas: Unremarkable. Spleen: Unremarkable. Adrenals/Urinary Tract: Negative adrenals. Punctate stone in the upper pole left kidney. No hydronephrosis or ureteral calculus. Bladder is decompressed by Foley catheter. Reproductive:Prostatectomy. No suspicious nodularity or pelvic lymph nodes Stomach/Bowel: No obstruction or inflammatory wall thickening. Moderate volume of stool. There are proximal colonic fluid levels which could be within normal limits. No appendicitis. There is mild fat edema around the rectum which is chronic and not associated with wall thickening. This is likely post treatment related. No specific explanation for rectal bleeding. Vascular/Lymphatic: No acute vascular abnormality. No mass or adenopathy. Peritoneal: No ascites or pneumoperitoneum. Musculoskeletal: L2-3 discectomy with solid bony fusion. L4-5 discectomy with no solid bony fusion. No acute osseous finding. IMPRESSION: 1. No acute finding or specific explanation for rectal bleeding. 2. Left nephrolithiasis. 3. Moderate stool volume. Electronically Signed   By: Monte Fantasia M.D.   On: 08/14/2015 01:12   Dg Chest Portable 1 View  08/13/2015  CLINICAL DATA:  Shortness of breath, abdominal pain, nausea, vomiting and bloody stools. History of hypertension. EXAM: PORTABLE CHEST 1 VIEW COMPARISON:  Chest radiograph August 05, 2014 FINDINGS: Cardiac silhouette appears moderately enlarged unchanged, mediastinal silhouette is unremarkable for this low inspiratory portable examination with crowded, mildly engorged vascular markings. No pleural effusion or focal consolidation. Prominent  pleural fat on the RIGHT. No pneumothorax. Large body habitus. Osseous structure nonsuspicious. IMPRESSION: Stable cardiomegaly, mild pulmonary vascular congestion. Electronically Signed   By: Elon Alas M.D.   On: 08/13/2015 23:26    Management plans discussed with the patient, and he is in agreement.  CODE STATUS:  Code Status History    Date Active Date Inactive Code Status Order ID Comments User Context   08/14/2015  2:27 AM 08/15/2015  3:49 PM Full Code [id number]  Saundra Shelling, MD ED   08/04/2014  6:26 PM 08/07/2014  9:55 PM Full Code [id number]  [name], MD Inpatient   08/03/2014  1:03 AM 08/03/2014  4:32 PM Full Code [id number]  Toy Baker, MD Inpatient   07/17/2013  5:20 PM 07/19/2013  1:25 PM Full Code [id number]  Eustace Moore, MD Inpatient    Advance Directive Documentation        Most Recent Value   Type of Advance Directive  Healthcare Power of Attorney, Living will   Pre-existing out of facility DNR order (yellow form or pink MOST form)     "MOST" Form in Place?        TOTAL TIME TAKING CARE OF THIS PATIENT: 35 minutes.    Loletha Grayer M.D on 08/15/2015 at 5:20 PM  Between 7am to 6pm - Pager - [phone]  After 6pm go to [email or web address] - password [email or web address] [facility]  Office  [phone]  CC: Primary care physician; Wilhemena Durie, MD

## 2015-08-15 NOTE — Discharge Instructions (Signed)
Gastrointestinal Bleeding °Gastrointestinal bleeding is bleeding somewhere along the path that food travels through the body (digestive tract). This path is anywhere between the mouth and the opening of the butt (anus). You may have blood in your throw up (vomit) or in your poop (stools). If there is a lot of bleeding, you may need to stay in the hospital. °HOME CARE °· Only take medicine as told by your doctor. °· Eat foods with fiber such as whole grains, fruits, and vegetables. You can also try eating 1 to 3 prunes a day. °· Drink enough fluids to keep your pee (urine) clear or pale yellow. °GET HELP RIGHT AWAY IF:  °· Your bleeding gets worse. °· You feel dizzy, weak, or you pass out (faint). °· You have bad cramps in your back or belly (abdomen). °· You have large blood clumps (clots) in your poop. °· Your problems are getting worse. °MAKE SURE YOU:  °· Understand these instructions. °· Will watch your condition. °· Will get help right away if you are not doing well or get worse. °  °This information is not intended to replace advice given to you by your health care provider. Make sure you discuss any questions you have with your health care provider. °  °Document Released: 04/05/2008 Document Revised: 06/13/2012 Document Reviewed: 12/15/2014 °Elsevier Interactive Patient Education ©2016 [employer]vier Inc[employer] ° °

## 2015-08-15 NOTE — Progress Notes (Signed)
RN has rated pt as a moderate fall risk. RN educated pt on the importance of the bed alarm for his safety. However, pt refused the bed alarm at this time. Will monitor pt closely.   Angus Seller

## 2015-08-15 NOTE — Plan of Care (Signed)
Problem: Safety: Goal: Ability to remain free from injury will improve Outcome: Progressing Pt has refused bed alarm, will monitor pt closely. RN has educated pt on the importance of the bed alarm for his safety.

## 2015-08-15 NOTE — Progress Notes (Signed)
Pt VSS, No complaints of pain nor nausea; Tolerating diet; Pt received discharge orders.  Instructions were reviewed with pt with all questions anwered. IV removed with dressing intact. Pt refused wheelchair and stated that he would just rather walk out.

## 2015-08-17 DIAGNOSIS — Z4789 Encounter for other orthopedic aftercare: Secondary | ICD-10-CM | POA: Diagnosis not present

## 2015-08-21 DIAGNOSIS — Z4789 Encounter for other orthopedic aftercare: Secondary | ICD-10-CM | POA: Diagnosis not present

## 2015-08-26 ENCOUNTER — Telehealth: Payer: Self-pay

## 2015-08-26 NOTE — Telephone Encounter (Signed)
Fax from [address] for refill on Indomethacin for patient. please review-aa

## 2015-08-27 MED ORDER — INDOMETHACIN 50 MG PO CAPS: 50.0000 mg | ORAL_CAPSULE | Freq: Two times a day (BID) | ORAL | Status: DC

## 2015-08-27 NOTE — Telephone Encounter (Signed)
RX sent in and below message sent in pharmacy notes to the [facility]

## 2015-08-27 NOTE — Telephone Encounter (Signed)
2 month refills.Then needs OV or find different doctor--he missed last 2 appts here.

## 2015-09-07 DIAGNOSIS — M79644 Pain in right finger(s): Secondary | ICD-10-CM | POA: Diagnosis not present

## 2015-09-07 DIAGNOSIS — Z4789 Encounter for other orthopedic aftercare: Secondary | ICD-10-CM | POA: Diagnosis not present

## 2015-09-21 DIAGNOSIS — M1A9XX1 Chronic gout, unspecified, with tophus (tophi): Secondary | ICD-10-CM | POA: Diagnosis not present

## 2015-09-21 DIAGNOSIS — M79642 Pain in left hand: Secondary | ICD-10-CM | POA: Diagnosis not present

## 2015-09-21 DIAGNOSIS — M79641 Pain in right hand: Secondary | ICD-10-CM | POA: Diagnosis not present

## 2015-09-21 LAB — AFB CULTURE WITH SMEAR (NOT AT ARMC): Acid Fast Smear: NONE SEEN

## 2015-10-02 DIAGNOSIS — N400 Enlarged prostate without lower urinary tract symptoms: Secondary | ICD-10-CM | POA: Diagnosis not present

## 2015-10-09 DIAGNOSIS — N390 Urinary tract infection, site not specified: Secondary | ICD-10-CM | POA: Diagnosis not present

## 2015-10-19 ENCOUNTER — Other Ambulatory Visit: Payer: Self-pay | Admitting: Family Medicine

## 2015-11-04 ENCOUNTER — Ambulatory Visit (INDEPENDENT_AMBULATORY_CARE_PROVIDER_SITE_OTHER): Payer: Medicare Other | Admitting: Family Medicine

## 2015-11-04 ENCOUNTER — Encounter: Payer: Self-pay | Admitting: Family Medicine

## 2015-11-04 VITALS — BP 126/70 | Temp 97.7°F | Resp 16 | Ht 79.0 in | Wt 365.0 lb

## 2015-11-04 DIAGNOSIS — E349 Endocrine disorder, unspecified: Secondary | ICD-10-CM | POA: Insufficient documentation

## 2015-11-04 DIAGNOSIS — E7800 Pure hypercholesterolemia, unspecified: Secondary | ICD-10-CM

## 2015-11-04 DIAGNOSIS — E039 Hypothyroidism, unspecified: Secondary | ICD-10-CM

## 2015-11-04 DIAGNOSIS — R7989 Other specified abnormal findings of blood chemistry: Secondary | ICD-10-CM

## 2015-11-04 DIAGNOSIS — I6782 Cerebral ischemia: Secondary | ICD-10-CM | POA: Insufficient documentation

## 2015-11-04 DIAGNOSIS — R6882 Decreased libido: Secondary | ICD-10-CM | POA: Insufficient documentation

## 2015-11-04 DIAGNOSIS — T7840XA Allergy, unspecified, initial encounter: Secondary | ICD-10-CM | POA: Insufficient documentation

## 2015-11-04 DIAGNOSIS — F320 Major depressive disorder, single episode, mild: Secondary | ICD-10-CM | POA: Insufficient documentation

## 2015-11-04 DIAGNOSIS — M1990 Unspecified osteoarthritis, unspecified site: Secondary | ICD-10-CM | POA: Insufficient documentation

## 2015-11-04 DIAGNOSIS — M159 Polyosteoarthritis, unspecified: Secondary | ICD-10-CM | POA: Insufficient documentation

## 2015-11-04 DIAGNOSIS — M109 Gout, unspecified: Secondary | ICD-10-CM | POA: Diagnosis not present

## 2015-11-04 DIAGNOSIS — I10 Essential (primary) hypertension: Secondary | ICD-10-CM | POA: Insufficient documentation

## 2015-11-04 DIAGNOSIS — G939 Disorder of brain, unspecified: Secondary | ICD-10-CM | POA: Insufficient documentation

## 2015-11-04 DIAGNOSIS — M9979 Connective tissue and disc stenosis of intervertebral foramina of abdomen and other regions: Secondary | ICD-10-CM | POA: Insufficient documentation

## 2015-11-04 DIAGNOSIS — Z8619 Personal history of other infectious and parasitic diseases: Secondary | ICD-10-CM | POA: Insufficient documentation

## 2015-11-04 DIAGNOSIS — I509 Heart failure, unspecified: Secondary | ICD-10-CM | POA: Insufficient documentation

## 2015-11-04 MED ORDER — ATORVASTATIN CALCIUM 20 MG PO TABS: 20.0000 mg | ORAL_TABLET | Freq: Every day | ORAL | Status: DC

## 2015-11-04 MED ORDER — ESCITALOPRAM OXALATE 10 MG PO TABS: 10.0000 mg | ORAL_TABLET | Freq: Every day | ORAL | Status: DC

## 2015-11-04 MED ORDER — LEVOTHYROXINE SODIUM 200 MCG PO TABS: 200.0000 ug | ORAL_TABLET | Freq: Every day | ORAL | Status: DC

## 2015-11-04 NOTE — Progress Notes (Signed)
Patient ID: Trish Fountain Sr., male   DOB: March 13, 1946, 70 y.o.   MRN: [id number]       Patient: Mylz Stivers Kosmicki Sr. Male    DOB: 04-Sep-1945   70 y.o.   MRN: [id number] Visit Date: 11/04/2015  Today's Provider: Wilhemena Durie, MD   No chief complaint on file.  Subjective:    HPI  Patient is here today for a follow up: Patient reports that he needs refills on his medications.   Hypertension: Patient reports that this has been stable. He reports that he took his last BP pill today.   Hypercholesterol: Patient reports that he has been out of his cholesterol medication for "a while". He reports that he needs a prescription for this.     Allergies  Allergen Reactions  . Bee Venom Anaphylaxis  . Shrimp [Shellfish Allergy] Anaphylaxis    "just shrimp"  . Wasp Venom Anaphylaxis  . Stadol [Butorphanol] Other (See Comments)    respiratory  distress   Previous Medications   ATORVASTATIN (LIPITOR) 20 MG TABLET    Take 1 tablet (20 mg total) by mouth daily.   COLCHICINE 0.6 MG TABLET    Take 0.6 mg by mouth 3 (three) times daily.   EPINEPHRINE (EPIPEN) 0.3 MG/0.3 ML SOAJ INJECTION    Inject 0.3 mg into the muscle as needed (allergic reaction).    ESCITALOPRAM (LEXAPRO) 10 MG TABLET    Take 10 mg by mouth daily.    HYDROMORPHONE (DILAUDID) 2 MG TABLET    Take 2 tablets (4 mg total) by mouth every 4 (four) hours as needed for moderate pain or severe pain.   INDOMETHACIN (INDOCIN) 50 MG CAPSULE    Take 1 capsule (50 mg total) by mouth 2 (two) times daily with a meal.   LEVOTHYROXINE (SYNTHROID, LEVOTHROID) 200 MCG TABLET    Take 200 mcg by mouth daily before breakfast.   OXYCODONE-ACETAMINOPHEN (PERCOCET/ROXICET) 5-325 MG TABLET    Take 1 tablet by mouth every 4 (four) hours as needed for severe pain.   POLYETHYLENE GLYCOL (MIRALAX / GLYCOLAX) PACKET    Take 17 g by mouth daily as needed for mild constipation or moderate constipation.   POTASSIUM CHLORIDE SA (K-DUR,KLOR-CON) 20 MEQ  TABLET    Take 1 tablet (20 mEq total) by mouth daily.    Review of Systems  Constitutional: Negative.   Eyes: Negative.   Respiratory: Negative.   Cardiovascular: Positive for leg swelling.  Endocrine: Negative.   Genitourinary: Negative.   Musculoskeletal: Positive for back pain and joint swelling.  Allergic/Immunologic: Negative.   Neurological: Negative.   Psychiatric/Behavioral: Negative.     Social History  Substance Use Topics  . Smoking status: Never Smoker   . Smokeless tobacco: Never Used  . Alcohol Use: Yes     Comment: " I drink wine about a year ago" 08/07/15   Objective:   BP 126/70 mmHg  Temp(Src) 97.7 F (36.5 C)  Resp 16  Ht 6\' 7"  (2.007 m)  Wt 365 lb (165.563 kg)  BMI 41.10 kg/m2  Physical Exam  Constitutional: He is oriented to person, place, and time. He appears well-developed and well-nourished.  Obese white male in no acute distress.  HENT:  Head: Normocephalic and atraumatic.  Right Ear: External ear normal.  Left Ear: External ear normal.  Nose: Nose normal.  Eyes: Conjunctivae are normal.  Neck: Neck supple.  Cardiovascular: Normal rate, regular rhythm and normal heart sounds.   Pulmonary/Chest: Effort normal and breath sounds  normal.  Abdominal: Soft. Bowel sounds are normal.  Musculoskeletal: He exhibits edema.  2+ edema.   Neurological: He is alert and oriented to person, place, and time.  Skin: Skin is warm and dry. Rash noted.  Venous stasis changes of the skin of the lower extremity below the knee  Psychiatric: He has a normal mood and affect. His behavior is normal. Thought content normal.        Assessment & Plan:     1. Hypercholesterolemia  - Lipid panel  2. Abnormal TSH   3. Acquired hypothyroidism  - TSH  4. Gout, unspecified cause, unspecified chronicity, unspecified site  - Uric acid 5. CAD All risk factors treated 6. Morbid obesity 7. Status post left orchiectomy 8. Status post prostatectomy 9.  Hypothyroidism  10. Congestive heart failure 11. Depression All issues are stable today. The patient was upset that he had to come in for an appointment not been here more than a year and he needed refills on his medications. These are all refilled for 1 year. He has follow-up with urology and cardiology.      I have done the exam and reviewed the above chart and it is accurate to the best of my knowledge.  Richard Cranford Mon, MD  [facility]

## 2015-11-23 DIAGNOSIS — M1A9XX1 Chronic gout, unspecified, with tophus (tophi): Secondary | ICD-10-CM | POA: Diagnosis not present

## 2015-11-23 DIAGNOSIS — Z5181 Encounter for therapeutic drug level monitoring: Secondary | ICD-10-CM | POA: Diagnosis not present

## 2015-11-25 DIAGNOSIS — N390 Urinary tract infection, site not specified: Secondary | ICD-10-CM | POA: Diagnosis not present

## 2015-11-25 DIAGNOSIS — N400 Enlarged prostate without lower urinary tract symptoms: Secondary | ICD-10-CM | POA: Diagnosis not present

## 2015-11-26 DIAGNOSIS — M7062 Trochanteric bursitis, left hip: Secondary | ICD-10-CM | POA: Diagnosis not present

## 2015-11-26 DIAGNOSIS — M25552 Pain in left hip: Secondary | ICD-10-CM | POA: Diagnosis not present

## 2015-12-15 ENCOUNTER — Telehealth: Payer: Self-pay

## 2015-12-15 MED ORDER — LEVOTHYROXINE SODIUM 200 MCG PO TABS: 200.0000 ug | ORAL_TABLET | Freq: Every day | ORAL | Status: DC

## 2015-12-15 MED ORDER — COLCHICINE 0.6 MG PO TABS: 0.6000 mg | ORAL_TABLET | Freq: Two times a day (BID) | ORAL | Status: DC

## 2015-12-15 MED ORDER — ESCITALOPRAM OXALATE 10 MG PO TABS: 10.0000 mg | ORAL_TABLET | Freq: Every day | ORAL | Status: DC

## 2015-12-15 MED ORDER — FUROSEMIDE 20 MG PO TABS: 20.0000 mg | ORAL_TABLET | Freq: Every day | ORAL | Status: DC

## 2015-12-15 MED ORDER — POTASSIUM CHLORIDE CRYS ER 20 MEQ PO TBCR: 20.0000 meq | EXTENDED_RELEASE_TABLET | Freq: Every day | ORAL | Status: DC

## 2015-12-15 MED ORDER — SPIRONOLACTONE 100 MG PO TABS: 100.0000 mg | ORAL_TABLET | Freq: Every day | ORAL | Status: DC

## 2015-12-15 MED ORDER — EPINEPHRINE 0.3 MG/0.3ML IJ SOAJ: 0.3000 mg | INTRAMUSCULAR | Status: DC | PRN

## 2015-12-15 MED ORDER — LOSARTAN POTASSIUM 100 MG PO TABS: 100.0000 mg | ORAL_TABLET | Freq: Every day | ORAL | Status: DC

## 2015-12-15 MED ORDER — ATORVASTATIN CALCIUM 20 MG PO TABS
20.0000 mg | ORAL_TABLET | Freq: Every day | ORAL | Status: DC
Start: 2015-12-15 — End: 2016-01-23

## 2015-12-15 MED ORDER — CARVEDILOL 12.5 MG PO TABS: 12.5000 mg | ORAL_TABLET | Freq: Every day | ORAL | Status: DC

## 2015-12-15 MED ORDER — ALLOPURINOL 100 MG PO TABS: ORAL_TABLET | ORAL | Status: DC

## 2015-12-15 NOTE — Telephone Encounter (Signed)
Patient called and states that on last visit in April his medications were discussed and he understood that medication refills would be done. His medication list we have did not match all of the medications he had including medication from specialists. Medication list updated. Patient states that urologist and ortho specialist told patient that he needs tog et medication refills from his PCP even though patient still following up with them. I advised patient usually the specialists would follow. I spoke with Dr. Rosanna Randy and he was ok to fill medications for 6 months and then patient needs to be seen if we are following this, but Cipro 500 mg 1 BID and indomethacin 50 mg 1 BID Dr. Rosanna Randy did not feel comfortable with feeling. These 2 medications should not be a chronic daily use due to interactions and issues this can cause and to follow up with specialists and confirm about these 2 medications with them. Patient advised and said he would see them for this and find out.-aa

## 2015-12-15 NOTE — Telephone Encounter (Signed)
Pt needs a renewal on his EPI pen.  He said his has expired.  He uses [facility] on [address]  His call back is [phone]  Thank sTeri

## 2015-12-15 NOTE — Telephone Encounter (Signed)
Done-aa 

## 2015-12-22 NOTE — Telephone Encounter (Signed)
This was already done-aa 

## 2015-12-22 NOTE — Telephone Encounter (Signed)
He can have 1 year rf on this

## 2015-12-29 DIAGNOSIS — M7062 Trochanteric bursitis, left hip: Secondary | ICD-10-CM | POA: Diagnosis not present

## 2015-12-29 DIAGNOSIS — M25552 Pain in left hip: Secondary | ICD-10-CM | POA: Diagnosis not present

## 2016-01-04 DIAGNOSIS — M25552 Pain in left hip: Secondary | ICD-10-CM | POA: Diagnosis not present

## 2016-01-04 DIAGNOSIS — M1612 Unilateral primary osteoarthritis, left hip: Secondary | ICD-10-CM | POA: Diagnosis not present

## 2016-01-04 DIAGNOSIS — M7062 Trochanteric bursitis, left hip: Secondary | ICD-10-CM | POA: Diagnosis not present

## 2016-01-09 DIAGNOSIS — E271 Primary adrenocortical insufficiency: Secondary | ICD-10-CM

## 2016-01-09 HISTORY — DX: Primary adrenocortical insufficiency: E27.1

## 2016-01-21 ENCOUNTER — Inpatient Hospital Stay (HOSPITAL_COMMUNITY)
Admission: EM | Admit: 2016-01-21 | Discharge: 2016-01-23 | DRG: 065 | Disposition: A | Discharge status: Home / Self Care | Payer: Medicare Other | Source: Intra-hospital | Attending: Family Medicine | Admitting: Family Medicine

## 2016-01-21 ENCOUNTER — Emergency Department (HOSPITAL_COMMUNITY): Payer: Medicare Other

## 2016-01-21 ENCOUNTER — Observation Stay (HOSPITAL_COMMUNITY): Payer: Medicare Other

## 2016-01-21 ENCOUNTER — Encounter (HOSPITAL_COMMUNITY): Payer: Self-pay | Admitting: Emergency Medicine

## 2016-01-21 DIAGNOSIS — E039 Hypothyroidism, unspecified: Secondary | ICD-10-CM | POA: Diagnosis present

## 2016-01-21 DIAGNOSIS — G8193 Hemiplegia, unspecified affecting right nondominant side: Secondary | ICD-10-CM | POA: Diagnosis present

## 2016-01-21 DIAGNOSIS — G4733 Obstructive sleep apnea (adult) (pediatric): Secondary | ICD-10-CM | POA: Diagnosis present

## 2016-01-21 DIAGNOSIS — I639 Cerebral infarction, unspecified: Secondary | ICD-10-CM | POA: Diagnosis not present

## 2016-01-21 DIAGNOSIS — Z8249 Family history of ischemic heart disease and other diseases of the circulatory system: Secondary | ICD-10-CM

## 2016-01-21 DIAGNOSIS — I6789 Other cerebrovascular disease: Secondary | ICD-10-CM | POA: Diagnosis not present

## 2016-01-21 DIAGNOSIS — Z91013 Allergy to seafood: Secondary | ICD-10-CM

## 2016-01-21 DIAGNOSIS — M1A9XX1 Chronic gout, unspecified, with tophus (tophi): Secondary | ICD-10-CM | POA: Diagnosis not present

## 2016-01-21 DIAGNOSIS — I6602 Occlusion and stenosis of left middle cerebral artery: Secondary | ICD-10-CM | POA: Diagnosis not present

## 2016-01-21 DIAGNOSIS — M6289 Other specified disorders of muscle: Secondary | ICD-10-CM | POA: Diagnosis not present

## 2016-01-21 DIAGNOSIS — I110 Hypertensive heart disease with heart failure: Secondary | ICD-10-CM | POA: Diagnosis not present

## 2016-01-21 DIAGNOSIS — Z96652 Presence of left artificial knee joint: Secondary | ICD-10-CM | POA: Diagnosis present

## 2016-01-21 DIAGNOSIS — Z981 Arthrodesis status: Secondary | ICD-10-CM

## 2016-01-21 DIAGNOSIS — R29702 NIHSS score 2: Secondary | ICD-10-CM | POA: Diagnosis present

## 2016-01-21 DIAGNOSIS — I252 Old myocardial infarction: Secondary | ICD-10-CM

## 2016-01-21 DIAGNOSIS — M6281 Muscle weakness (generalized): Secondary | ICD-10-CM | POA: Diagnosis not present

## 2016-01-21 DIAGNOSIS — Z79899 Other long term (current) drug therapy: Secondary | ICD-10-CM

## 2016-01-21 DIAGNOSIS — F329 Major depressive disorder, single episode, unspecified: Secondary | ICD-10-CM | POA: Diagnosis present

## 2016-01-21 DIAGNOSIS — G459 Transient cerebral ischemic attack, unspecified: Secondary | ICD-10-CM

## 2016-01-21 DIAGNOSIS — Z888 Allergy status to other drugs, medicaments and biological substances status: Secondary | ICD-10-CM

## 2016-01-21 DIAGNOSIS — Z6841 Body Mass Index (BMI) 40.0 and over, adult: Secondary | ICD-10-CM | POA: Diagnosis not present

## 2016-01-21 DIAGNOSIS — Z823 Family history of stroke: Secondary | ICD-10-CM

## 2016-01-21 DIAGNOSIS — R531 Weakness: Secondary | ICD-10-CM | POA: Diagnosis not present

## 2016-01-21 DIAGNOSIS — Z91030 Bee allergy status: Secondary | ICD-10-CM

## 2016-01-21 DIAGNOSIS — E785 Hyperlipidemia, unspecified: Secondary | ICD-10-CM | POA: Diagnosis present

## 2016-01-21 DIAGNOSIS — I5032 Chronic diastolic (congestive) heart failure: Secondary | ICD-10-CM | POA: Diagnosis not present

## 2016-01-21 DIAGNOSIS — I2510 Atherosclerotic heart disease of native coronary artery without angina pectoris: Secondary | ICD-10-CM | POA: Diagnosis present

## 2016-01-21 DIAGNOSIS — Z87440 Personal history of urinary (tract) infections: Secondary | ICD-10-CM

## 2016-01-21 DIAGNOSIS — R29818 Other symptoms and signs involving the nervous system: Secondary | ICD-10-CM | POA: Diagnosis not present

## 2016-01-21 LAB — DIFFERENTIAL
Basophils Absolute: 0 10*3/uL (ref 0.0–0.1)
Basophils Relative: 0 %
Eosinophils Absolute: 0.1 10*3/uL (ref 0.0–0.7)
Eosinophils Relative: 2 %
Lymphocytes Relative: 26 %
Lymphs Abs: 1.5 10*3/uL (ref 0.7–4.0)
Monocytes Absolute: 0.3 10*3/uL (ref 0.1–1.0)
Monocytes Relative: 5 %
Neutro Abs: 3.7 10*3/uL (ref 1.7–7.7)
Neutrophils Relative %: 67 %

## 2016-01-21 LAB — URINALYSIS, ROUTINE W REFLEX MICROSCOPIC
Bilirubin Urine: NEGATIVE
Glucose, UA: NEGATIVE mg/dL
Hgb urine dipstick: NEGATIVE
Ketones, ur: NEGATIVE mg/dL
Leukocytes, UA: NEGATIVE
Nitrite: NEGATIVE
Protein, ur: NEGATIVE mg/dL
Specific Gravity, Urine: 1.014 (ref 1.005–1.030)
pH: 5.5 (ref 5.0–8.0)

## 2016-01-21 LAB — I-STAT TROPONIN, ED: Troponin i, poc: 0 ng/mL (ref 0.00–0.08)

## 2016-01-21 LAB — RAPID URINE DRUG SCREEN, HOSP PERFORMED
Amphetamines: NOT DETECTED
Barbiturates: NOT DETECTED
Benzodiazepines: NOT DETECTED
Cocaine: NOT DETECTED
Opiates: NOT DETECTED
Tetrahydrocannabinol: NOT DETECTED

## 2016-01-21 LAB — CBC
HCT: 42.7 % (ref 39.0–52.0)
Hemoglobin: 14.4 g/dL (ref 13.0–17.0)
MCH: 29.1 pg (ref 26.0–34.0)
MCHC: 33.7 g/dL (ref 30.0–36.0)
MCV: 86.4 fL (ref 78.0–100.0)
Platelets: 155 10*3/uL (ref 150–400)
RBC: 4.94 MIL/uL (ref 4.22–5.81)
RDW: 13.6 % (ref 11.5–15.5)
WBC: 5.6 10*3/uL (ref 4.0–10.5)

## 2016-01-21 LAB — COMPREHENSIVE METABOLIC PANEL
ALT: 17 U/L (ref 17–63)
AST: 18 U/L (ref 15–41)
Albumin: 3.7 g/dL (ref 3.5–5.0)
Alkaline Phosphatase: 53 U/L (ref 38–126)
Anion gap: 7 (ref 5–15)
BUN: 15 mg/dL (ref 6–20)
CO2: 24 mmol/L (ref 22–32)
Calcium: 9.6 mg/dL (ref 8.9–10.3)
Chloride: 104 mmol/L (ref 101–111)
Creatinine, Ser: 1.17 mg/dL (ref 0.61–1.24)
GFR calc Af Amer: >60 mL/min (ref >60)
GFR calc non Af Amer: >60 mL/min (ref >60)
Glucose, Bld: 122 mg/dL — ABNORMAL HIGH (ref 65–99)
Potassium: 4 mmol/L (ref 3.5–5.1)
Sodium: 135 mmol/L (ref 135–145)
Total Bilirubin: 1 mg/dL (ref 0.3–1.2)
Total Protein: 6.8 g/dL (ref 6.5–8.1)

## 2016-01-21 LAB — I-STAT CHEM 8, ED
BUN: 18 mg/dL (ref 6–20)
Calcium, Ion: 1.2 mmol/L (ref 1.12–1.23)
Chloride: 101 mmol/L (ref 101–111)
Creatinine, Ser: 1.2 mg/dL (ref 0.61–1.24)
Glucose, Bld: 124 mg/dL — ABNORMAL HIGH (ref 65–99)
HCT: 43 % (ref 39.0–52.0)
Hemoglobin: 14.6 g/dL (ref 13.0–17.0)
Potassium: 3.9 mmol/L (ref 3.5–5.1)
Sodium: 139 mmol/L (ref 135–145)
TCO2: 23 mmol/L (ref 0–100)

## 2016-01-21 LAB — APTT: aPTT: 31 seconds (ref 24–37)

## 2016-01-21 LAB — GLUCOSE, CAPILLARY: Glucose-Capillary: 110 mg/dL — ABNORMAL HIGH (ref 65–99)

## 2016-01-21 LAB — PROTIME-INR
INR: 1.06 (ref 0.00–1.49)
Prothrombin Time: 14 seconds (ref 11.6–15.2)

## 2016-01-21 LAB — CBG MONITORING, ED: Glucose-Capillary: 116 mg/dL — ABNORMAL HIGH (ref 65–99)

## 2016-01-21 LAB — ETHANOL: Alcohol, Ethyl (B): <5 mg/dL (ref <5)

## 2016-01-21 LAB — TSH: TSH: 11.772 u[IU]/mL — ABNORMAL HIGH (ref 0.350–4.500)

## 2016-01-21 MED ORDER — LEVOTHYROXINE SODIUM 100 MCG PO TABS
200.0000 ug | ORAL_TABLET | Freq: Every day | ORAL | Status: DC
  Administered 2016-01-22 – 2016-01-23 (×2): 200 ug via ORAL
  Filled 2016-01-21 (×2): qty 2

## 2016-01-21 MED ORDER — SPIRONOLACTONE 25 MG PO TABS
100.0000 mg | ORAL_TABLET | Freq: Every day | ORAL | Status: DC
  Administered 2016-01-22 – 2016-01-23 (×2): 100 mg via ORAL
  Filled 2016-01-21 (×2): qty 4

## 2016-01-21 MED ORDER — SENNOSIDES-DOCUSATE SODIUM 8.6-50 MG PO TABS: 1.0000 | ORAL_TABLET | Freq: Every evening | ORAL | Status: DC | PRN

## 2016-01-21 MED ORDER — ALLOPURINOL 100 MG PO TABS
100.0000 mg | ORAL_TABLET | Freq: Two times a day (BID) | ORAL | Status: DC
  Administered 2016-01-21 – 2016-01-23 (×4): 100 mg via ORAL
  Filled 2016-01-21 (×4): qty 1

## 2016-01-21 MED ORDER — STROKE: EARLY STAGES OF RECOVERY BOOK
Freq: Once | Status: DC
  Filled 2016-01-21: qty 1

## 2016-01-21 MED ORDER — ENOXAPARIN SODIUM 40 MG/0.4ML ~~LOC~~ SOLN
40.0000 mg | SUBCUTANEOUS | Status: DC
  Administered 2016-01-21: 40 mg via SUBCUTANEOUS
  Filled 2016-01-21: qty 0.4

## 2016-01-21 MED ORDER — CARVEDILOL 12.5 MG PO TABS
12.5000 mg | ORAL_TABLET | Freq: Every day | ORAL | Status: DC
  Administered 2016-01-22 – 2016-01-23 (×2): 12.5 mg via ORAL
  Filled 2016-01-21 (×2): qty 1

## 2016-01-21 MED ORDER — ASPIRIN 81 MG PO CHEW
324.0000 mg | CHEWABLE_TABLET | Freq: Once | ORAL | Status: AC
  Administered 2016-01-21: 324 mg via ORAL
  Filled 2016-01-21: qty 4

## 2016-01-21 MED ORDER — ESCITALOPRAM OXALATE 10 MG PO TABS
10.0000 mg | ORAL_TABLET | Freq: Every day | ORAL | Status: DC
  Administered 2016-01-22 – 2016-01-23 (×2): 10 mg via ORAL
  Filled 2016-01-21 (×2): qty 1

## 2016-01-21 MED ORDER — POTASSIUM CHLORIDE CRYS ER 20 MEQ PO TBCR
20.0000 meq | EXTENDED_RELEASE_TABLET | Freq: Every day | ORAL | Status: DC
  Administered 2016-01-22 – 2016-01-23 (×2): 20 meq via ORAL
  Filled 2016-01-21 (×2): qty 1

## 2016-01-21 MED ORDER — FUROSEMIDE 20 MG PO TABS
20.0000 mg | ORAL_TABLET | Freq: Every day | ORAL | Status: DC
  Administered 2016-01-22 – 2016-01-23 (×2): 20 mg via ORAL
  Filled 2016-01-21 (×2): qty 1

## 2016-01-21 MED ORDER — CIPROFLOXACIN HCL 500 MG PO TABS
500.0000 mg | ORAL_TABLET | Freq: Once | ORAL | Status: AC
  Administered 2016-01-21: 500 mg via ORAL

## 2016-01-21 MED ORDER — CIPROFLOXACIN HCL 500 MG PO TABS
500.0000 mg | ORAL_TABLET | Freq: Two times a day (BID) | ORAL | Status: DC
  Administered 2016-01-22 – 2016-01-23 (×3): 500 mg via ORAL
  Filled 2016-01-21 (×4): qty 1

## 2016-01-21 MED ORDER — ASPIRIN 325 MG PO TABS
325.0000 mg | ORAL_TABLET | Freq: Every day | ORAL | Status: DC
  Administered 2016-01-21 – 2016-01-23 (×3): 325 mg via ORAL
  Filled 2016-01-21 (×3): qty 1

## 2016-01-21 MED ORDER — LOSARTAN POTASSIUM 50 MG PO TABS
100.0000 mg | ORAL_TABLET | Freq: Every day | ORAL | Status: DC
  Administered 2016-01-22 – 2016-01-23 (×2): 100 mg via ORAL
  Filled 2016-01-21 (×2): qty 2

## 2016-01-21 MED ORDER — ATORVASTATIN CALCIUM 10 MG PO TABS
20.0000 mg | ORAL_TABLET | Freq: Every day | ORAL | Status: DC
  Administered 2016-01-22: 20 mg via ORAL
  Filled 2016-01-21: qty 2

## 2016-01-21 MED ORDER — SODIUM CHLORIDE 0.9 % IV SOLN
INTRAVENOUS | Status: DC
  Administered 2016-01-21: 1000 mL via INTRAVENOUS

## 2016-01-21 MED ORDER — ASPIRIN 300 MG RE SUPP: 300.0000 mg | Freq: Every day | RECTAL | Status: DC

## 2016-01-21 NOTE — Progress Notes (Signed)
Placed patient on CPAP for the night set at 10cm.

## 2016-01-21 NOTE — Code Documentation (Signed)
70yo male arriving to [facility] via [place] at 1.  Patient from home where he had sudden onset difficulty using his right hand at 1330.  EMS was called and assessed right sided weakness and activated a code stroke.  Stroke team at the bedside on patient arrival.  Labs drawn and patient cleared for CT by Dr. Maryan Rued.  Patient to CT with team.  CT completed.  NIHSS 2, see documentation for details and code stroke times.  Patient with right arm and leg drift on exam.  Patient with h/o stroke with no residual deficits per EMS.  Dr. Cristobal Goldmann at the bedside.  Patient is too mild to treat with tPA per MD, however, patient remains in the window to treat with tPA should symptoms worsen until 1800.  Bedside handoff with ED RN Freida Busman.

## 2016-01-21 NOTE — ED Notes (Signed)
Pt coming in by EMS, from home. Per EMS, pt at home with family when he began to have R sided weakness to his arm and leg. No slurred speech or facial droop at that time. Prior hx of CVA "years ago". Recent surgical hx in Nov 2016.
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
## 2016-01-21 NOTE — Progress Notes (Signed)
Pt arrived to 5M20 @ current time, Pt A&Ox 4, c/o pain 0/10.  Pt VS taken. Pt without distress. Family at the bedside. Diet ordered, will monitor.

## 2016-01-22 ENCOUNTER — Encounter (HOSPITAL_COMMUNITY): Payer: Self-pay | Admitting: General Practice

## 2016-01-22 ENCOUNTER — Observation Stay (HOSPITAL_BASED_OUTPATIENT_CLINIC_OR_DEPARTMENT_OTHER): Payer: Medicare Other

## 2016-01-22 ENCOUNTER — Observation Stay (HOSPITAL_COMMUNITY): Payer: Medicare Other

## 2016-01-22 DIAGNOSIS — E785 Hyperlipidemia, unspecified: Secondary | ICD-10-CM | POA: Diagnosis present

## 2016-01-22 DIAGNOSIS — E039 Hypothyroidism, unspecified: Secondary | ICD-10-CM | POA: Diagnosis present

## 2016-01-22 DIAGNOSIS — I639 Cerebral infarction, unspecified: Secondary | ICD-10-CM | POA: Diagnosis present

## 2016-01-22 DIAGNOSIS — G459 Transient cerebral ischemic attack, unspecified: Secondary | ICD-10-CM

## 2016-01-22 DIAGNOSIS — M6289 Other specified disorders of muscle: Secondary | ICD-10-CM | POA: Diagnosis not present

## 2016-01-22 DIAGNOSIS — I2510 Atherosclerotic heart disease of native coronary artery without angina pectoris: Secondary | ICD-10-CM | POA: Diagnosis present

## 2016-01-22 DIAGNOSIS — Z91030 Bee allergy status: Secondary | ICD-10-CM | POA: Diagnosis not present

## 2016-01-22 DIAGNOSIS — R29702 NIHSS score 2: Secondary | ICD-10-CM | POA: Diagnosis present

## 2016-01-22 DIAGNOSIS — I110 Hypertensive heart disease with heart failure: Secondary | ICD-10-CM | POA: Diagnosis present

## 2016-01-22 DIAGNOSIS — Z87440 Personal history of urinary (tract) infections: Secondary | ICD-10-CM | POA: Diagnosis not present

## 2016-01-22 DIAGNOSIS — Z79899 Other long term (current) drug therapy: Secondary | ICD-10-CM | POA: Diagnosis not present

## 2016-01-22 DIAGNOSIS — M1A9XX1 Chronic gout, unspecified, with tophus (tophi): Secondary | ICD-10-CM | POA: Diagnosis present

## 2016-01-22 DIAGNOSIS — Z8673 Personal history of transient ischemic attack (TIA), and cerebral infarction without residual deficits: Secondary | ICD-10-CM

## 2016-01-22 DIAGNOSIS — Z888 Allergy status to other drugs, medicaments and biological substances status: Secondary | ICD-10-CM | POA: Diagnosis not present

## 2016-01-22 DIAGNOSIS — G4733 Obstructive sleep apnea (adult) (pediatric): Secondary | ICD-10-CM | POA: Diagnosis present

## 2016-01-22 DIAGNOSIS — Z96652 Presence of left artificial knee joint: Secondary | ICD-10-CM | POA: Diagnosis present

## 2016-01-22 DIAGNOSIS — Z823 Family history of stroke: Secondary | ICD-10-CM | POA: Diagnosis not present

## 2016-01-22 DIAGNOSIS — Z91013 Allergy to seafood: Secondary | ICD-10-CM | POA: Diagnosis not present

## 2016-01-22 DIAGNOSIS — I5032 Chronic diastolic (congestive) heart failure: Secondary | ICD-10-CM | POA: Diagnosis present

## 2016-01-22 DIAGNOSIS — Z6841 Body Mass Index (BMI) 40.0 and over, adult: Secondary | ICD-10-CM | POA: Diagnosis not present

## 2016-01-22 DIAGNOSIS — F329 Major depressive disorder, single episode, unspecified: Secondary | ICD-10-CM | POA: Diagnosis present

## 2016-01-22 DIAGNOSIS — Z981 Arthrodesis status: Secondary | ICD-10-CM | POA: Diagnosis not present

## 2016-01-22 DIAGNOSIS — G8193 Hemiplegia, unspecified affecting right nondominant side: Secondary | ICD-10-CM | POA: Diagnosis present

## 2016-01-22 DIAGNOSIS — Z8249 Family history of ischemic heart disease and other diseases of the circulatory system: Secondary | ICD-10-CM | POA: Diagnosis not present

## 2016-01-22 DIAGNOSIS — I252 Old myocardial infarction: Secondary | ICD-10-CM | POA: Diagnosis not present

## 2016-01-22 HISTORY — DX: Personal history of transient ischemic attack (TIA), and cerebral infarction without residual deficits: Z86.73

## 2016-01-22 LAB — VAS US CAROTID
LEFT ECA DIAS: -23 cm/s
LEFT VERTEBRAL DIAS: -14 cm/s
Left CCA dist dias: -26 cm/s
Left CCA dist sys: -98 cm/s
Left CCA prox dias: 28 cm/s
Left CCA prox sys: 145 cm/s
Left ICA dist dias: -29 cm/s
Left ICA dist sys: -82 cm/s
Left ICA prox dias: -28 cm/s
Left ICA prox sys: -88 cm/s
RIGHT ECA DIAS: -21 cm/s
RIGHT VERTEBRAL DIAS: -10 cm/s
Right CCA prox dias: 22 cm/s
Right CCA prox sys: 96 cm/s
Right cca dist sys: -78 cm/s

## 2016-01-22 LAB — BASIC METABOLIC PANEL
Anion gap: 11 (ref 5–15)
BUN: 15 mg/dL (ref 6–20)
CO2: 24 mmol/L (ref 22–32)
Calcium: 9.4 mg/dL (ref 8.9–10.3)
Chloride: 101 mmol/L (ref 101–111)
Creatinine, Ser: 1.12 mg/dL (ref 0.61–1.24)
GFR calc Af Amer: >60 mL/min (ref >60)
GFR calc non Af Amer: >60 mL/min (ref >60)
Glucose, Bld: 107 mg/dL — ABNORMAL HIGH (ref 65–99)
Potassium: 4 mmol/L (ref 3.5–5.1)
Sodium: 136 mmol/L (ref 135–145)

## 2016-01-22 LAB — ECHOCARDIOGRAM COMPLETE
Height: 79 in
Weight: 5961.24 oz

## 2016-01-22 LAB — LIPID PANEL
Cholesterol: 220 mg/dL — ABNORMAL HIGH (ref 0–200)
HDL: 35 mg/dL — ABNORMAL LOW (ref >40)
LDL Cholesterol: 149 mg/dL — ABNORMAL HIGH (ref 0–99)
Total CHOL/HDL Ratio: 6.3 RATIO
Triglycerides: 180 mg/dL — ABNORMAL HIGH (ref <150)
VLDL: 36 mg/dL (ref 0–40)

## 2016-01-22 MED ORDER — ENOXAPARIN SODIUM 80 MG/0.8ML ~~LOC~~ SOLN
80.0000 mg | SUBCUTANEOUS | Status: DC
  Administered 2016-01-22: 80 mg via SUBCUTANEOUS
  Filled 2016-01-22: qty 0.8

## 2016-01-22 MED ORDER — ATORVASTATIN CALCIUM 80 MG PO TABS
80.0000 mg | ORAL_TABLET | Freq: Every day | ORAL | Status: DC
  Administered 2016-01-23: 80 mg via ORAL
  Filled 2016-01-22: qty 1

## 2016-01-22 NOTE — Evaluation (Signed)
Physical Therapy Evaluation Patient Details Name: Dennis Morgano Harshfield Sr. MRN: [id number] DOB: 09/06/1945 Today's Date: 01/22/2016   History of Present Illness  Acute onset of right sided weaknessMRI: Fewtiny acute ischemic nonhemorrhagic right MCA territory infarcts. PMHx: stroke (2007, 2013)  Clinical Impression  Pt is at or close to baseline functioning and should be safe at home with family assist . There are no further acute PT needs.  Will sign off at this time.     Follow Up Recommendations No PT follow up    Equipment Recommendations  None recommended by PT    Recommendations for Other Services       Precautions / Restrictions Precautions Precautions: None Restrictions Weight Bearing Restrictions: No      Mobility  Bed Mobility Overal bed mobility: Modified Independent             General bed mobility comments: increased time  Transfers Overall transfer level: Needs assistance   Transfers: Sit to/from Stand Sit to Stand: Modified independent (Device/Increase time)            Ambulation/Gait Ambulation/Gait assistance: Modified independent (Device/Increase time) Ambulation Distance (Feet): 160 Feet Assistive device: None Gait Pattern/deviations: Step-through pattern   Gait velocity interpretation: at or above normal speed for age/gender General Gait Details: steady and only mildly noticeable incoordination on the R Le  Stairs Stairs: Yes Stairs assistance: Modified independent (Device/Increase time) Stair Management: One rail Right;Alternating pattern Number of Stairs: 4 General stair comments: steady  Wheelchair Mobility    Modified Rankin (Stroke Patients Only) Modified Rankin (Stroke Patients Only) Pre-Morbid Rankin Score: No significant disability Modified Rankin: No significant disability     Balance Overall balance assessment: Needs assistance Sitting-balance support: No upper extremity supported Sitting balance-Leahy Scale:  Normal     Standing balance support: No upper extremity supported Standing balance-Leahy Scale: Good                               Pertinent Vitals/Pain Pain Assessment: No/denies pain    Home Living Family/patient expects to be discharged to:: Private residence Living Arrangements: Spouse/significant other Available Help at Discharge: Family;Available PRN/intermittently Type of Home: House Home Access: Stairs to enter Entrance Stairs-Rails: Right;Left Entrance Stairs-Number of Steps: several Home Layout: Two level;Able to live on main level with bedroom/bathroom Home Equipment: Shower seat;Walker - 2 wheels;Bedside commode;Hand held shower head      Prior Function Level of Independence: Independent         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    Extremity/Trunk Assessment   Upper Extremity Assessment: Defer to OT evaluation           Lower Extremity Assessment: RLE deficits/detail RLE Deficits / Details: very mildly weak and uncoordinated       Communication   Communication: No difficulties  Cognition Arousal/Alertness: Awake/alert Behavior During Therapy: WFL for tasks assessed/performed Overall Cognitive Status: Within Functional Limits for tasks assessed                      General Comments      Exercises        Assessment/Plan    PT Assessment Patent does not need any further PT services  PT Diagnosis     PT Problem List    PT Treatment Interventions     PT Goals (Current goals can be found in the  Care Plan section) Acute Rehab PT Goals Patient Stated Goal: to get back work-conference call today to [place] PT Goal Formulation: All assessment and education complete, DC therapy    Frequency     Barriers to discharge        Co-evaluation PT/OT/SLP Co-Evaluation/Treatment: Yes Reason for Co-Treatment: For patient/therapist safety PT goals addressed during session:  Mobility/safety with mobility         End of Session   Activity Tolerance: Patient tolerated treatment well Patient left: in chair;with call bell/phone within reach Nurse Communication: Mobility status    Functional Assessment Tool Used: clinical judgement Functional Limitation: Mobility: Walking and moving around Mobility: Walking and Moving Around Current Status [phone]): 0 percent impaired, limited or restricted Mobility: Walking and Moving Around Goal Status [phone]): 0 percent impaired, limited or restricted Mobility: Walking and Moving Around Discharge Status [phone]): 0 percent impaired, limited or restricted    Time: 1004-1026 PT Time Calculation (min) (ACUTE ONLY): 22 min   Charges:   PT Evaluation $PT Eval Low Complexity: 1 Procedure     PT G Codes:   PT G-Codes **NOT FOR INPATIENT CLASS** Functional Assessment Tool Used: clinical judgement Functional Limitation: Mobility: Walking and moving around Mobility: Walking and Moving Around Current Status [id number]): 0 percent impaired, limited or restricted Mobility: Walking and Moving Around Goal Status [id number]): 0 percent impaired, limited or restricted Mobility: Walking and Moving Around Discharge Status [phone]): 0 percent impaired, limited or restricted    Karita Dralle, Tessie Fass 01/22/2016, 10:50 AM 01/22/2016  Donnella Sham, PT [phone]  (pager)

## 2016-01-22 NOTE — Progress Notes (Signed)
  Echocardiogram 2D Echocardiogram has been performed.  Jennette Dubin 01/22/2016, 12:04 PM

## 2016-01-22 NOTE — Progress Notes (Signed)
STROKE TEAM PROGRESS NOTE   HISTORY OF PRESENT ILLNESS (per record) Dennis Nations Debroux Sr. is an 70 y.o. male with hx significant for stroke in the past with no residual symptoms comes in today 01/21/2016 with acute onset of R sided arm and leg weakness. Denies any headache, speech problems, sensory issues and CP. He was last known well 01/21/16 at 1330. Patient was not administered IV t-PA secondary to low NIHSS of 2. He was admitted for further evaluation and treatment.   SUBJECTIVE (INTERVAL HISTORY) He is being picked up by transporters currently. He still reports weakness in his R side.  OBJECTIVE Temp:  [96.8 F (36 C)-98.4 F (36.9 C)] 97.7 F (36.5 C) (07/14 0924) Pulse Rate:  [57-73] 65 (07/14 0924) Cardiac Rhythm:  [-] Normal sinus rhythm (07/14 0700) Resp:  [10-21] 18 (07/14 0924) BP: (110-157)/(71-100) 154/94 mmHg (07/14 0924) SpO2:  [97 %-100 %] 100 % (07/14 0924) Weight:  [169 kg (372 lb 9.2 oz)] 169 kg (372 lb 9.2 oz) (07/13 1545)  CBC:  Recent Labs Lab 01/21/16 1529 01/21/16 1532  WBC 5.6  --   NEUTROABS 3.7  --   HGB 14.4 14.6  HCT 42.7 43.0  MCV 86.4  --   PLT 155  --     Basic Metabolic Panel:  Recent Labs Lab 01/21/16 1529 01/21/16 1532 01/22/16 0556  NA 135 139 136  K 4.0 3.9 4.0  CL 104 101 101  CO2 24  --  24  GLUCOSE 122* 124* 107*  BUN 15 18 15   CREATININE 1.17 1.20 1.12  CALCIUM 9.6  --  9.4    Lipid Panel:    Component Value Date/Time   CHOL 220* 01/22/2016 0556   TRIG 180* 01/22/2016 0556   HDL 35* 01/22/2016 0556   CHOLHDL 6.3 01/22/2016 0556   VLDL 36 01/22/2016 0556   LDLCALC 149* 01/22/2016 0556   HgbA1c:  Lab Results  Component Value Date   HGBA1C 5.2 08/03/2014   Urine Drug Screen:    Component Value Date/Time   LABOPIA NONE DETECTED 01/21/2016 1728   COCAINSCRNUR NONE DETECTED 01/21/2016 1728   LABBENZ NONE DETECTED 01/21/2016 1728   AMPHETMU NONE DETECTED 01/21/2016 1728   THCU NONE DETECTED 01/21/2016 1728    LABBARB NONE DETECTED 01/21/2016 1728      IMAGING  [name] Head Wo Contrast  01/21/2016  CLINICAL DATA:  Initial evaluation for acute right-sided weakness. EXAM: MRI HEAD WITHOUT CONTRAST MRA HEAD WITHOUT CONTRAST TECHNIQUE: Multiplanar, multiecho pulse sequences of the brain and surrounding structures were obtained without intravenous contrast. Angiographic images of the head were obtained using MRA technique without contrast. COMPARISON:  Prior CT from earlier same day. FINDINGS: MRI HEAD FINDINGS Diffuse prominence of the CSF containing spaces is compatible with generalized cerebral atrophy, mildly advanced for patient age. Patchy and confluent T2/FLAIR hyperintensity within the periventricular and deep white matter both cerebral hemispheres most consistent with chronic small vessel ischemic disease, mild in nature. Chronic small vessel ischemic type changes present within the pons. Remote lacunar infarct within the left thalamus. Small amount of associated susceptibility artifact, likely chronic hemorrhage. Additional small focus of susceptibility artifact within the left occipital lobe, likely a small chronic micro hemorrhage. Patchy small volume acute multi focal ischemic infarcts present throughout the left MCA territory. Largest focus of infarct measures approximately 18 mm and involves the left caudate body. Patchy infarcts involve the deep and subcortical white matter of the left centrum semi ovale. Patchy small volume cortical  infarcts within the left frontal and parietal lobes as well. No associated hemorrhage or mass effect. No made of a small punctate ischemic infarct within the subcortical white matter of the right centrum semi ovale, likely synchronous small vessel ischemia (series 3, image 40). Possible minimal patchy cortical infarct within the posterior right frontal parietal region as well (series 3, image 35). Gray-white matter differentiation otherwise maintained. Major intracranial  vascular flow voids are preserved. No acute intracranial hemorrhage. No mass lesion, midline shift, or mass effect. Mild ventricular prominence related to global parenchymal volume loss without hydrocephalus. No extra-axial fluid collection. Major dural sinuses are grossly patent. Craniocervical junction normal. Scattered multilevel degenerative spondylolysis present within the visualized upper cervical spine with degenerative disc bulging at C3-4. Resultant mild canal stenosis without frank cord impingement. Pituitary gland within normal limits. No acute abnormality about the globes and orbits. Patient is status post clear of banding on the right. Mild scattered mucosal thickening within the ethmoidal air cells. Paranasal sinuses are otherwise clear. Trace opacity within the bilateral mastoid air cells. Inner ear structures normal. Bone marrow signal intensity within normal limits. No scalp soft tissue abnormality. MRA HEAD FINDINGS ANTERIOR CIRCULATION: Visualized distal cervical segments of the internal carotid arteries are patent with antegrade flow. Petrous segments patent bilaterally. Cavernous and supraclinoid segments demonstrate mild atheromatous irregularity without high-grade stenosis. A1 segments patent bilaterally. Right A1 segments demonstrates mild atheromatous irregularity and stenosis. Anterior communicating artery normal. Anterior cerebral arteries well opacified to their distal aspects. Right M1 segment patent without stenosis or occlusion. Right MCA bifurcation normal. Distal right MCA branches well opacified. Short-segment mild to moderate stenosis at the proximal left M1 segment (series 453, image 7). Mild atheromatous irregularity within the left M1 segment distally without focal stenosis. Left MCA bifurcation normal. Proximal left M2 branch stenosis noted (series 453, image 7). Left MCA branches opacified distally. POSTERIOR CIRCULATION: Left vertebral artery dominant and patent to the  vertebrobasilar junction. Diminutive right vertebral artery patent as well. Posterior inferior cerebral arteries patent. Basilar artery mildly tortuous but well opacified to its distal aspect. Superior cerebellar arteries patent proximally. Both of the posterior cerebral arteries arise from the basilar artery. PCAs demonstrate atheromatous irregularity bilaterally without focal high-grade stenosis. No aneurysm or vascular malformation. IMPRESSION: MRI HEAD IMPRESSION: 1. Patchy small volume multi focal acute ischemic left MCA territory infarcts as above. No associated hemorrhage or mass effect. 2. Few additional tiny acute ischemic nonhemorrhagic right MCA territory infarcts as above. 3. Remote lacunar infarct within the left thalamus. 4. Mild for age cerebral atrophy with chronic small vessel ischemic disease. MRA HEAD IMPRESSION: 1. No large or proximal arterial branch occlusion within the intracranial circulation. 2. Short-segment mild to moderate stenosis at the proximal left M1 segment. No other high-grade or correctable stenosis identified. 3. Additional atheromatous irregularity involving the left MCA and bilateral PCAs as detailed above. Electronically Signed   By: Jeannine Boga M.D.   On: 01/21/2016 23:47   Mr Brain Wo Contrast  01/21/2016  CLINICAL DATA:  Initial evaluation for acute right-sided weakness. EXAM: MRI HEAD WITHOUT CONTRAST MRA HEAD WITHOUT CONTRAST TECHNIQUE: Multiplanar, multiecho pulse sequences of the brain and surrounding structures were obtained without intravenous contrast. Angiographic images of the head were obtained using MRA technique without contrast. COMPARISON:  Prior CT from earlier same day. FINDINGS: MRI HEAD FINDINGS Diffuse prominence of the CSF containing spaces is compatible with generalized cerebral atrophy, mildly advanced for patient age. Patchy and confluent T2/FLAIR hyperintensity within the periventricular and  deep white matter both cerebral hemispheres  most consistent with chronic small vessel ischemic disease, mild in nature. Chronic small vessel ischemic type changes present within the pons. Remote lacunar infarct within the left thalamus. Small amount of associated susceptibility artifact, likely chronic hemorrhage. Additional small focus of susceptibility artifact within the left occipital lobe, likely a small chronic micro hemorrhage. Patchy small volume acute multi focal ischemic infarcts present throughout the left MCA territory. Largest focus of infarct measures approximately 18 mm and involves the left caudate body. Patchy infarcts involve the deep and subcortical white matter of the left centrum semi ovale. Patchy small volume cortical infarcts within the left frontal and parietal lobes as well. No associated hemorrhage or mass effect. No made of a small punctate ischemic infarct within the subcortical white matter of the right centrum semi ovale, likely synchronous small vessel ischemia (series 3, image 40). Possible minimal patchy cortical infarct within the posterior right frontal parietal region as well (series 3, image 35). Gray-white matter differentiation otherwise maintained. Major intracranial vascular flow voids are preserved. No acute intracranial hemorrhage. No mass lesion, midline shift, or mass effect. Mild ventricular prominence related to global parenchymal volume loss without hydrocephalus. No extra-axial fluid collection. Major dural sinuses are grossly patent. Craniocervical junction normal. Scattered multilevel degenerative spondylolysis present within the visualized upper cervical spine with degenerative disc bulging at C3-4. Resultant mild canal stenosis without frank cord impingement. Pituitary gland within normal limits. No acute abnormality about the globes and orbits. Patient is status post clear of banding on the right. Mild scattered mucosal thickening within the ethmoidal air cells. Paranasal sinuses are otherwise clear.  Trace opacity within the bilateral mastoid air cells. Inner ear structures normal. Bone marrow signal intensity within normal limits. No scalp soft tissue abnormality. MRA HEAD FINDINGS ANTERIOR CIRCULATION: Visualized distal cervical segments of the internal carotid arteries are patent with antegrade flow. Petrous segments patent bilaterally. Cavernous and supraclinoid segments demonstrate mild atheromatous irregularity without high-grade stenosis. A1 segments patent bilaterally. Right A1 segments demonstrates mild atheromatous irregularity and stenosis. Anterior communicating artery normal. Anterior cerebral arteries well opacified to their distal aspects. Right M1 segment patent without stenosis or occlusion. Right MCA bifurcation normal. Distal right MCA branches well opacified. Short-segment mild to moderate stenosis at the proximal left M1 segment (series 453, image 7). Mild atheromatous irregularity within the left M1 segment distally without focal stenosis. Left MCA bifurcation normal. Proximal left M2 branch stenosis noted (series 453, image 7). Left MCA branches opacified distally. POSTERIOR CIRCULATION: Left vertebral artery dominant and patent to the vertebrobasilar junction. Diminutive right vertebral artery patent as well. Posterior inferior cerebral arteries patent. Basilar artery mildly tortuous but well opacified to its distal aspect. Superior cerebellar arteries patent proximally. Both of the posterior cerebral arteries arise from the basilar artery. PCAs demonstrate atheromatous irregularity bilaterally without focal high-grade stenosis. No aneurysm or vascular malformation. IMPRESSION: MRI HEAD IMPRESSION: 1. Patchy small volume multi focal acute ischemic left MCA territory infarcts as above. No associated hemorrhage or mass effect. 2. Few additional tiny acute ischemic nonhemorrhagic right MCA territory infarcts as above. 3. Remote lacunar infarct within the left thalamus. 4. Mild for age  cerebral atrophy with chronic small vessel ischemic disease. MRA HEAD IMPRESSION: 1. No large or proximal arterial branch occlusion within the intracranial circulation. 2. Short-segment mild to moderate stenosis at the proximal left M1 segment. No other high-grade or correctable stenosis identified. 3. Additional atheromatous irregularity involving the left MCA and bilateral PCAs as detailed above.  Electronically Signed   By: Jeannine Boga M.D.   On: 01/21/2016 23:47   Ct Head Code Stroke W/o Cm  01/21/2016  CLINICAL DATA:  70 year old male with acute right-sided weakness today. Code stroke. EXAM: CT HEAD WITHOUT CONTRAST TECHNIQUE: Contiguous axial images were obtained from the base of the skull through the vertex without intravenous contrast. COMPARISON:  05/29/2015 MR and prior studies. FINDINGS: Atrophy, chronic small-vessel white matter ischemic changes and remote left caudate infarct again noted. No acute intracranial abnormalities are identified, including mass lesion or mass effect, hydrocephalus, extra-axial fluid collection, midline shift, hemorrhage, or acute infarction. The visualized bony calvarium is unremarkable. IMPRESSION: No evidence of acute intracranial abnormality. Atrophy, chronic small-vessel white matter ischemic changes and remote left caudate infarct. Critical Value/emergent results were called by telephone at the time of interpretation on 01/21/2016 at 3:39 pm to Dr. Cristobal Goldmann , who verbally acknowledged these results. Electronically Signed   By: Margarette Canada M.D.   On: 01/21/2016 15:39       PHYSICAL EXAM Pleasant elderly male not in distress. . Afebrile. Head is nontraumatic. Neck is supple without bruit.    Cardiac exam no murmur or gallop. Lungs are clear to auscultation. Distal pulses are well felt. Neurological Exam :  Awake alert oriented x 3 normal speech and language. Mild left lower face asymmetry. Tongue midline. No drift. Mild diminished fine finger movements on  left. Orbits right over left upper extremity. Mild left grip weak.. Normal sensation . Normal coordination.  ASSESSMENT/PLAN Mr. Lalit Thelen Lancour Sr. is a 70 y.o. male with history of hypertension, hypothyroidism, arthritis, CAD, stroke, CHF, OSA, gout, recurrent UTIs and testicular and prostatic cancer presenting with R side arm and leg weakness. He did not receive IV t-PA due to low NIHSS 2.   Stroke:   Patchy L MCA and few tiny R MCA territory infarcts, suspect thromboembolic source from ICA. Dopplers pending   Resultant  R hemiparesis  MRI  Patchy L MCA and few tiny R MCA territory infarcts  MRA  No large proximal vessel stenosis, mild to mod proximal L M1 stenosis  Carotid Doppler  Pending. If carotid negative for source, pt needs embolic workup including TEE and loop recorder if negative. Can be done as an OP if medically stable for discharge over the weekend.  2D Echo  pending   LDL 149  HgbA1c pending  Lovenox 80 mg sq daily for VTE prophylaxis  Diet Heart Room service appropriate?: Yes; Fluid consistency:: Thin  No antithrombotic prior to admission, now on aspirin 325 mg daily  Patient counseled to be compliant with his antithrombotic medications  Ongoing aggressive stroke risk factor management  Therapy recommendations:  No therapy needs.  Disposition:  Return home  Hypertension  Stable  Permissive hypertension (OK if < 220/120) but gradually normalize in 5-7 days  Long-term BP goal normotensive  Hyperlipidemia  Home meds:  lipitor 20, resumed in hospital and increased to 80  LDL 149, goal < 70  Continue statin at discharge  Other Stroke Risk Factors  Advanced age  ETOH use, advised to drink no more than 2 drink(s) a day  Morbid Obesity, Body mass index is 41.96 kg/(m^2)., recommend weight loss, diet and exercise as appropriate   Hx stroke/TIA  2004 R brainstem without residual deficits  Family hx stroke (mother)  Coronary artery disease - s/p  MI   Obstructive sleep apnea, on CPAP at home  Other Active Problems  Chronic UTI  Gout  Hypothyroidism  Depression   Hospital day # [place] for Pager information 01/22/2016 3:11 PM  I have personally examined this patient, reviewed notes, independently viewed imaging studies, participated in medical decision making and plan of care. I have made any additions or clarifications directly to the above note. Agree with note above. He presented with right-sided weakness and numbness due to (embolic infarcts. Further evaluation with TEE and loop recorder for atrial fibrillation. Start aspirin and statin. Further stroke workup may be arranged as an outpatient. Greater than 50% time during this 25 minute visit was spent on counseling and coordination of care about stroke risk, prevention and treatment  Antony Contras, MD Medical Director [place] Pager: [phone] 01/22/2016 6:23 PM    To contact Stroke Continuity provider, please refer to [email or web address] After hours, contact General Neurology

## 2016-01-22 NOTE — Discharge Summary (Signed)
[facility] Discharge Summary  Patient name: Dennis [name]. Medical record number: [id number] Date of birth: [birth year] Age: 70 y.o. Gender: male Date of Admission: 01/21/2016  Date of Discharge: 01/23/16 Admitting Physician: Blane Ohara McDiarmid, MD  Primary Care Provider: Wilhemena Durie, MD Consultants: Neuro  Indication for Hospitalization: acute stroke  Discharge Diagnoses/Problem List:  Patient Active Problem List   Diagnosis Date Noted  . Acute CVA (cerebrovascular accident) ([place]) 01/22/2016  . Right sided weakness 01/21/2016  . TIA (transient ischemic attack) 01/21/2016  . Acquired hypothyroidism 11/04/2015  . Arthritis 11/04/2015  . Congestive heart failure ([place]) 11/04/2015  . Decreased libido 11/04/2015  . Narrowing of intervertebral disc space 11/04/2015  . Major depressive disorder, single episode, mild ([place]) 11/04/2015  . Essential (primary) hypertension 11/04/2015  . H/o Lyme disease 11/04/2015  . Hypercholesterolemia 11/04/2015  . Hypotestosteronism 11/04/2015  . Morbid obesity ([place]) 11/04/2015  . Allergic state 11/04/2015  . Temporary cerebral vascular dysfunction 11/04/2015  . Chronic tophaceous gout 09/21/2015  . Rectal bleed 08/14/2015  . Rectal bleeding 08/14/2015  . Blood glucose elevated 06/02/2015  . Benign prostatic hyperplasia with urinary obstruction 09/26/2014  . Epididymitis, left 08/04/2014  . OSA on CPAP 08/03/2014  . Diastolic CHF ([place]) 99991111  . Epididymitis 08/03/2014  . Abnormal TSH 08/03/2014  . Cellulitis of scrotum   . Cellulitis, scrotum 08/02/2014  . Hypertension 08/02/2014  . CAD (coronary artery disease) 08/02/2014  . Bursitis, trochanteric 05/15/2014  . Bleeding per rectum 12/03/2013  . S/P lumbar spinal fusion 07/17/2013  . Chronic back pain      Disposition: Home  Discharge Condition: Stable, improved  Discharge Exam:  General: obese middle aged man, NAD Cardiovascular: RRR, no  apparent murmors, distant heart sounds Respiratory: CTABL, normal work of breathing Abdomen: soft, non-tender, non-distended MSK: no edema, strength 4/5 in RUE and RLE, 5/5 in LLE and LUE. Grip strength diminished in right hand compared to left.  Extremities: warm and well perfused, no cyanosis, 1+ pitting edema just proximal to the ankles bilaterally Neuro: AAOx3 Psych: Normal mood and affect  Brief Hospital Course:  Dennis Shintani Murrell Sr. is a 70 y.o. male with a past medical history significant for hypertension, hypothyroidism, arthritis, CAD, stroke, CHF, OSA, gout, recurrent UTIs and testicular and prostatic cancer who presented with right sided weakness secondary to a stroke.   #Right sided weakness 2/2 to CVA event  Patient with a significant history of stroke (2007, 2013), presented to ED with right arm and leg weakness, and some facial droop. CT Head in the ED showed some atrophy and chronic small vessel ischemic changes and a remote left caudate infarct with no other acute intracranial abnormalities. Weakness most likely secondary to Left Sided CVA. MRI showed L MCA acute multifocal infarct and MRA showed short-segment mild to moderate stenosis at the proximal left M1 segment and atheromatous irregularity involving the left MCA and bilateral PCAs. Cholesterol 220, triglycerides 180, HDL 35, LDL 149. TSH 11.7 (did not get levothyroxine yesterday). EKG showed NSR with some artifact. Seen by PT/OT--cleared. Recommend no home PT/OT. - cont telemetry for possible atrial fibrillation - Discuss proper ways to take levothyroxine and increase dose if properly taking -f/u Echo and carotid US -cont ASA 325mg  QD  - increase lipitor to 80mg  daily  #CHF Patient with a history of CHF last echo in 2015 showed and EF 60-65% with grade 1 diastolic dysfunction. Patient has 1+ pitting edema in lower extremities just beyond ankles bilaterally.  Lungs clear on exam and no SOB. Patient notes  swelling sometimes in his feet and attributes it to being immobile past couple days. 24 hr I/O= 1450cc UOP on 20mg  furosemide.  --Furosemide 20 mg bid po --Beta blocker; carvedilol 12.5 mg daily --ARB; losartan 100 mg daily --Spironolactone 100 mg daily --Strict I/O --Daily weight   #Hypertension Well controlled with medications. 154/94 this AM, however in the 120s-130s overnight.  --Coreg, 12.25 mcg once daily --Losartan 100 mg once daily --Spironolactone  #UTI, Chronic Patient with extensive history of GU surgery with left orchiectomy, prostatectomy and bladder restructuring in November 2016. Patient has had recurrent UTI (7 in one year) 2/2 moderate urethral stricture and has been placed on prophylatic antibiotic. There is some inconsistency within the chart as patient had been on daily Cipro with what looks to be a recent change to [facility] however patient and wife both state that he continues to be taking Cipro daily, and most recent records do not show the use of either Cipro nor Macrobid. --Ciprofloxacin 500 mg po BID --determine whether patient has good follow up with Urology  #Gout Patient with a history of gout, currently well controlled. Will continue home regimen --Continue allopurinol 100 mg po BID  #Hyperlipidemia --Continue atorvastatin 20 mg once daily  #Hypothyroidism:  Need to determine how patient takes his levothyroxine (with or without food, with or without other meds). Needs to be taking it without food, without other meds for full efficacy.  -- discuss with patient and increase if taking properly --Continue Levothyroxine 200 mcg once daily for now  #OSA Patient use a CPAP machine at home --Will order CPA while inpatient - will bring in home mask --Incentive spirometry  Issues for Follow Up:  1.  Team recommends TEE and loop recorder with a Cardiologist to investigate for embolic source 2.  Follow up with Urologist regarding chronic antibiotic use  and recurrent UTIs 3.  Hypothyroidism:  Currently on 200ug levothyroxine and TSH was 11.4 despite taking medication correctly.  Patient will likely need an increase in dose.  Will need post-hospital follow up with primary doctor.   Significant Procedures: None  Significant Labs and Imaging:   Recent Labs Lab 01/21/16 1529 01/21/16 1532  WBC 5.6  --   HGB 14.4 14.6  HCT 42.7 43.0  PLT 155  --     Recent Labs Lab 01/21/16 1529 01/21/16 1532 01/22/16 0556  NA 135 139 136  K 4.0 3.9 4.0  CL 104 101 101  CO2 24  --  24  GLUCOSE 122* 124* 107*  BUN 15 18 15   CREATININE 1.17 1.20 1.12  CALCIUM 9.6  --  9.4  ALKPHOS 53  --   --   AST 18  --   --   ALT 17  --   --   ALBUMIN 3.7  --   --      Results/Tests Pending at Time of Discharge: None  Discharge Medications:    Medication List    TAKE these medications        allopurinol 100 MG tablet  Commonly known as:  ZYLOPRIM  Take 5 tablets daily     aspirin 325 MG tablet  Take 1 tablet (325 mg total) by mouth daily.     atorvastatin 80 MG tablet  Commonly known as:  LIPITOR  Take 1 tablet (80 mg total) by mouth daily.     carvedilol 12.5 MG tablet  Commonly known as:  COREG  Take 1 tablet (  12.5 mg total) by mouth daily.     colchicine 0.6 MG tablet  Take 1 tablet (0.6 mg total) by mouth 2 (two) times daily.     EPINEPHrine 0.3 mg/0.3 mL Soaj injection  Commonly known as:  EPI-PEN  Inject 0.3 mLs (0.3 mg total) into the muscle as needed (allergic reaction).     escitalopram 10 MG tablet  Commonly known as:  LEXAPRO  Take 1 tablet (10 mg total) by mouth daily.     furosemide 20 MG tablet  Commonly known as:  LASIX  Take 1 tablet (20 mg total) by mouth daily.     indomethacin 50 MG capsule  Commonly known as:  INDOCIN  Take 1 capsule (50 mg total) by mouth 2 (two) times daily with a meal.     levothyroxine 200 MCG tablet  Commonly known as:  SYNTHROID, LEVOTHROID  Take 1 tablet (200 mcg total) by mouth  daily before breakfast.     losartan 100 MG tablet  Commonly known as:  COZAAR  Take 1 tablet (100 mg total) by mouth daily.     oxyCODONE-acetaminophen 5-325 MG tablet  Commonly known as:  PERCOCET/ROXICET  Take 1 tablet by mouth every 4 (four) hours as needed for severe pain.     polyethylene glycol packet  Commonly known as:  MIRALAX / GLYCOLAX  Take 17 g by mouth daily as needed for mild constipation or moderate constipation.     potassium chloride SA 20 MEQ tablet  Commonly known as:  K-DUR,KLOR-CON  Take 1 tablet (20 mEq total) by mouth daily.     spironolactone 100 MG tablet  Commonly known as:  ALDACTONE  Take 1 tablet (100 mg total) by mouth daily.        Discharge Instructions: Please refer to Patient Instructions section of EMR for full details.  Patient was counseled important signs and symptoms that should prompt return to medical care, changes in medications, dietary instructions, activity restrictions, and follow up appointments.   Follow-Up Appointments: Follow-up Information    Follow up with Wilhemena Durie, MD. Schedule an appointment as soon as possible for a visit in 1 week.   Specialty:  Family Medicine   Why:  hospital follow up   Contact information:   [address] [place] [address] [phone]       Eloise Levels, MD 01/23/2016, 11:45 AM PGY-1, [place]

## 2016-01-22 NOTE — Progress Notes (Signed)
Family Medicine Teaching Service Daily Progress Note Intern Pager: [phone]  Patient name: Dennis Gusmano Sr. Medical record number: [id number] Date of birth: 07-30-1945 Age: 70 y.o. Gender: male  Primary Care Provider: Wilhemena Durie, MD Consultants: Neurology Code Status: Full    Pt Overview and Major Events to Date:  01/22/16:  Admitted to FMTS  Assessment and Plan: Dennis Nations Barritt Sr. is a 70 y.o. male with a past medical history significant for hypertension, hypothyroidism, arthritis, CAD, stroke, CHF, OSA, gout, recurrent UTIs and testicular and prostatic cancer who presented with right sided weakness secondary to a stroke.   #Right sided weakness 2/2 to CVA event  Patient with a significant history of stroke (2007, 2013), presented to ED with right arm and leg weakness, and some facial droop. CT Head in the ED showed some atrophy and chronic small vessel ischemic changes and a remote left caudate infarct with no other acute intracranial abnormalities. Weakness most likely secondary to Left Sided CVA. MRI showed L MCA acute multifocal infarct and MRA showed short-segment mild to moderate stenosis at the proximal left M1 segment and atheromatous irregularity involving the left MCA and bilateral PCAs.  Cholesterol 220, triglycerides 180, HDL 35, LDL 149.   TSH 11.7 (did not get levothyroxine yesterday).  EKG showed NSR with some artifact.  Seen by PT/OT--cleared.  Recommend no home PT/OT.  - cont telemetry for possible atrial fibrillation -  Discuss proper ways to take levothyroxine and increase dose if properly taking -f/u Echo and carotid US -cont ASA 325mg  QD  - increase lipitor to 80mg  daily  #CHF Patient with a history of CHF last echo in 2015 showed and EF 60-65% with grade 1 diastolic dysfunction. Patient has 1+ pitting edema in lower extremities just beyond ankles bilaterally. Lungs clear on exam and no SOB. Patient notes swelling sometimes in his feet and attributes it to  being immobile past couple days.   24 hr I/O= 1450cc UOP on 20mg  furosemide.  --Furosemide 20 mg bid po --Beta blocker; carvedilol 12.5 mg daily --ARB; losartan 100 mg daily --Spironolactone 100 mg daily --Strict I/O --Daily weight   #Hypertension Well controlled with medications.  154/94 this AM, however in the 120s-130s overnight.  --Coreg, 12.25 mcg once daily --Losartan 100 mg once daily --Spironolactone  #UTI, Chronic Patient with extensive history of GU surgery with left orchiectomy, prostatectomy and bladder restructuring in November 2016. Patient has had recurrent UTI (7 in one year) 2/2 moderate urethral stricture and has been placed on prophylatic antibiotic. There is some inconsistency within the chart as patient had been on daily Cipro with what looks to be a recent change to [facility] however patient and wife both state that he continues to be taking Cipro daily, and most recent records do not show the use of either Cipro nor Macrobid. --Ciprofloxacin 500 mg po BID --determine whether patient has good follow up with Urology  #Gout Patient with a history of gout, currently well controlled. Will continue home regimen --Continue allopurinol 100 mg po BID  #Hyperlipidemia --Continue atorvastatin 20 mg once daily  #Hypothyroidism:  Need to determine how patient takes his levothyroxine (with or without food, with or without other meds).  Needs to be taking it without food, without other meds for full efficacy.  -- discuss with patient and increase if taking properly --Continue Levothyroxine 200 mcg once daily for now  #OSA Patient use a CPAP machine at home --Will order CPA while inpatient - will bring in home mask --  Incentive spirometry  #Depression -Continue Lexapro 10mg  once daily  FEN/GI: Heart healthy diet PPx: Lovenox  Disposition: Admit to FMTS for observation  Subjective:  Patient feels improved this AM, denies any SOB, CP, heart palpitations, NVD.  He did  not have much sleep last night because the facemask for his CPAP machine was uncomfortable.  Will bring his home mask in for tonight.    Objective: Temp:  [96.8 F (36 C)-98.4 F (36.9 C)] 97.7 F (36.5 C) (07/14 0924) Pulse Rate:  [57-73] 65 (07/14 0924) Resp:  [10-21] 18 (07/14 0924) BP: (110-157)/(71-100) 154/94 mmHg (07/14 0924) SpO2:  [97 %-100 %] 100 % (07/14 0924) Weight:  [372 lb 9.2 oz (169 kg)] 372 lb 9.2 oz (169 kg) (07/13 1545) Physical Exam: General: obese middle aged man, NAD Cardiovascular: RRR, no apparent murmors, distant heart sounds Respiratory: CTABL, normal work of breathing  Abdomen: soft, non-tender, non-distended MSK: no edema, strength 4/5 in RUE and RLE, 5/5 in LLE and LUE.  Grip strength diminished in right hand compared to left.  Extremities: warm and well perfused, no cyanosis, 1+ pitting edema just proximal to the ankles bilaterally Neuro: AAOx3 Psych: Normal mood and affect  Laboratory:  Recent Labs Lab 01/21/16 1529 01/21/16 1532  WBC 5.6  --   HGB 14.4 14.6  HCT 42.7 43.0  PLT 155  --     Recent Labs Lab 01/21/16 1529 01/21/16 1532 01/22/16 0556  NA 135 139 136  K 4.0 3.9 4.0  CL 104 101 101  CO2 24  --  24  BUN 15 18 15   CREATININE 1.17 1.20 1.12  CALCIUM 9.6  --  9.4  PROT 6.8  --   --   BILITOT 1.0  --   --   ALKPHOS 53  --   --   ALT 17  --   --   AST 18  --   --   GLUCOSE 122* 124* 107*      Imaging/Diagnostic Tests: [name] Wo Contrast  01/21/2016  CLINICAL DATA:  Initial evaluation for acute right-sided weakness. EXAM: MRI HEAD WITHOUT CONTRAST MRA HEAD WITHOUT CONTRAST TECHNIQUE: Multiplanar, multiecho pulse sequences of the brain and surrounding structures were obtained without intravenous contrast. Angiographic images of the head were obtained using MRA technique without contrast. COMPARISON:  Prior CT from earlier same day. FINDINGS: MRI HEAD FINDINGS Diffuse prominence of the CSF containing spaces is compatible  with generalized cerebral atrophy, mildly advanced for patient age. Patchy and confluent T2/FLAIR hyperintensity within the periventricular and deep white matter both cerebral hemispheres most consistent with chronic small vessel ischemic disease, mild in nature. Chronic small vessel ischemic type changes present within the pons. Remote lacunar infarct within the left thalamus. Small amount of associated susceptibility artifact, likely chronic hemorrhage. Additional small focus of susceptibility artifact within the left occipital lobe, likely a small chronic micro hemorrhage. Patchy small volume acute multi focal ischemic infarcts present throughout the left MCA territory. Largest focus of infarct measures approximately 18 mm and involves the left caudate body. Patchy infarcts involve the deep and subcortical white matter of the left centrum semi ovale. Patchy small volume cortical infarcts within the left frontal and parietal lobes as well. No associated hemorrhage or mass effect. No made of a small punctate ischemic infarct within the subcortical white matter of the right centrum semi ovale, likely synchronous small vessel ischemia (series 3, image 40). Possible minimal patchy cortical infarct within the posterior right frontal parietal region as  well (series 3, image 35). Gray-white matter differentiation otherwise maintained. Major intracranial vascular flow voids are preserved. No acute intracranial hemorrhage. No mass lesion, midline shift, or mass effect. Mild ventricular prominence related to global parenchymal volume loss without hydrocephalus. No extra-axial fluid collection. Major dural sinuses are grossly patent. Craniocervical junction normal. Scattered multilevel degenerative spondylolysis present within the visualized upper cervical spine with degenerative disc bulging at C3-4. Resultant mild canal stenosis without frank cord impingement. Pituitary gland within normal limits. No acute abnormality  about the globes and orbits. Patient is status post clear of banding on the right. Mild scattered mucosal thickening within the ethmoidal air cells. Paranasal sinuses are otherwise clear. Trace opacity within the bilateral mastoid air cells. Inner ear structures normal. Bone marrow signal intensity within normal limits. No scalp soft tissue abnormality. MRA HEAD FINDINGS ANTERIOR CIRCULATION: Visualized distal cervical segments of the internal carotid arteries are patent with antegrade flow. Petrous segments patent bilaterally. Cavernous and supraclinoid segments demonstrate mild atheromatous irregularity without high-grade stenosis. A1 segments patent bilaterally. Right A1 segments demonstrates mild atheromatous irregularity and stenosis. Anterior communicating artery normal. Anterior cerebral arteries well opacified to their distal aspects. Right M1 segment patent without stenosis or occlusion. Right MCA bifurcation normal. Distal right MCA branches well opacified. Short-segment mild to moderate stenosis at the proximal left M1 segment (series 453, image 7). Mild atheromatous irregularity within the left M1 segment distally without focal stenosis. Left MCA bifurcation normal. Proximal left M2 branch stenosis noted (series 453, image 7). Left MCA branches opacified distally. POSTERIOR CIRCULATION: Left vertebral artery dominant and patent to the vertebrobasilar junction. Diminutive right vertebral artery patent as well. Posterior inferior cerebral arteries patent. Basilar artery mildly tortuous but well opacified to its distal aspect. Superior cerebellar arteries patent proximally. Both of the posterior cerebral arteries arise from the basilar artery. PCAs demonstrate atheromatous irregularity bilaterally without focal high-grade stenosis. No aneurysm or vascular malformation. IMPRESSION: MRI HEAD IMPRESSION: 1. Patchy small volume multi focal acute ischemic left MCA territory infarcts as above. No associated  hemorrhage or mass effect. 2. Few additional tiny acute ischemic nonhemorrhagic right MCA territory infarcts as above. 3. Remote lacunar infarct within the left thalamus. 4. Mild for age cerebral atrophy with chronic small vessel ischemic disease. MRA HEAD IMPRESSION: 1. No large or proximal arterial branch occlusion within the intracranial circulation. 2. Short-segment mild to moderate stenosis at the proximal left M1 segment. No other high-grade or correctable stenosis identified. 3. Additional atheromatous irregularity involving the left MCA and bilateral PCAs as detailed above. Electronically Signed   By: Jeannine Boga M.D.   On: 01/21/2016 23:47   Mr Brain Wo Contrast  01/21/2016  CLINICAL DATA:  Initial evaluation for acute right-sided weakness. EXAM: MRI HEAD WITHOUT CONTRAST MRA HEAD WITHOUT CONTRAST TECHNIQUE: Multiplanar, multiecho pulse sequences of the brain and surrounding structures were obtained without intravenous contrast. Angiographic images of the head were obtained using MRA technique without contrast. COMPARISON:  Prior CT from earlier same day. FINDINGS: MRI HEAD FINDINGS Diffuse prominence of the CSF containing spaces is compatible with generalized cerebral atrophy, mildly advanced for patient age. Patchy and confluent T2/FLAIR hyperintensity within the periventricular and deep white matter both cerebral hemispheres most consistent with chronic small vessel ischemic disease, mild in nature. Chronic small vessel ischemic type changes present within the pons. Remote lacunar infarct within the left thalamus. Small amount of associated susceptibility artifact, likely chronic hemorrhage. Additional small focus of susceptibility artifact within the left occipital lobe, likely a small  chronic micro hemorrhage. Patchy small volume acute multi focal ischemic infarcts present throughout the left MCA territory. Largest focus of infarct measures approximately 18 mm and involves the left caudate  body. Patchy infarcts involve the deep and subcortical white matter of the left centrum semi ovale. Patchy small volume cortical infarcts within the left frontal and parietal lobes as well. No associated hemorrhage or mass effect. No made of a small punctate ischemic infarct within the subcortical white matter of the right centrum semi ovale, likely synchronous small vessel ischemia (series 3, image 40). Possible minimal patchy cortical infarct within the posterior right frontal parietal region as well (series 3, image 35). Gray-white matter differentiation otherwise maintained. Major intracranial vascular flow voids are preserved. No acute intracranial hemorrhage. No mass lesion, midline shift, or mass effect. Mild ventricular prominence related to global parenchymal volume loss without hydrocephalus. No extra-axial fluid collection. Major dural sinuses are grossly patent. Craniocervical junction normal. Scattered multilevel degenerative spondylolysis present within the visualized upper cervical spine with degenerative disc bulging at C3-4. Resultant mild canal stenosis without frank cord impingement. Pituitary gland within normal limits. No acute abnormality about the globes and orbits. Patient is status post clear of banding on the right. Mild scattered mucosal thickening within the ethmoidal air cells. Paranasal sinuses are otherwise clear. Trace opacity within the bilateral mastoid air cells. Inner ear structures normal. Bone marrow signal intensity within normal limits. No scalp soft tissue abnormality. MRA HEAD FINDINGS ANTERIOR CIRCULATION: Visualized distal cervical segments of the internal carotid arteries are patent with antegrade flow. Petrous segments patent bilaterally. Cavernous and supraclinoid segments demonstrate mild atheromatous irregularity without high-grade stenosis. A1 segments patent bilaterally. Right A1 segments demonstrates mild atheromatous irregularity and stenosis. Anterior  communicating artery normal. Anterior cerebral arteries well opacified to their distal aspects. Right M1 segment patent without stenosis or occlusion. Right MCA bifurcation normal. Distal right MCA branches well opacified. Short-segment mild to moderate stenosis at the proximal left M1 segment (series 453, image 7). Mild atheromatous irregularity within the left M1 segment distally without focal stenosis. Left MCA bifurcation normal. Proximal left M2 branch stenosis noted (series 453, image 7). Left MCA branches opacified distally. POSTERIOR CIRCULATION: Left vertebral artery dominant and patent to the vertebrobasilar junction. Diminutive right vertebral artery patent as well. Posterior inferior cerebral arteries patent. Basilar artery mildly tortuous but well opacified to its distal aspect. Superior cerebellar arteries patent proximally. Both of the posterior cerebral arteries arise from the basilar artery. PCAs demonstrate atheromatous irregularity bilaterally without focal high-grade stenosis. No aneurysm or vascular malformation. IMPRESSION: MRI HEAD IMPRESSION: 1. Patchy small volume multi focal acute ischemic left MCA territory infarcts as above. No associated hemorrhage or mass effect. 2. Few additional tiny acute ischemic nonhemorrhagic right MCA territory infarcts as above. 3. Remote lacunar infarct within the left thalamus. 4. Mild for age cerebral atrophy with chronic small vessel ischemic disease. MRA HEAD IMPRESSION: 1. No large or proximal arterial branch occlusion within the intracranial circulation. 2. Short-segment mild to moderate stenosis at the proximal left M1 segment. No other high-grade or correctable stenosis identified. 3. Additional atheromatous irregularity involving the left MCA and bilateral PCAs as detailed above. Electronically Signed   By: Jeannine Boga M.D.   On: 01/21/2016 23:47   Ct Head Code Stroke W/o Cm  01/21/2016  CLINICAL DATA:  70 year old male with acute  right-sided weakness today. Code stroke. EXAM: CT HEAD WITHOUT CONTRAST TECHNIQUE: Contiguous axial images were obtained from the base of the skull through the vertex  without intravenous contrast. COMPARISON:  05/29/2015 MR and prior studies. FINDINGS: Atrophy, chronic small-vessel white matter ischemic changes and remote left caudate infarct again noted. No acute intracranial abnormalities are identified, including mass lesion or mass effect, hydrocephalus, extra-axial fluid collection, midline shift, hemorrhage, or acute infarction. The visualized bony calvarium is unremarkable. IMPRESSION: No evidence of acute intracranial abnormality. Atrophy, chronic small-vessel white matter ischemic changes and remote left caudate infarct. Critical Value/emergent results were called by telephone at the time of interpretation on 01/21/2016 at 3:39 pm to Dr. Cristobal Goldmann , who verbally acknowledged these results. Electronically Signed   By: Margarette Canada M.D.   On: 01/21/2016 15:39    Eloise Levels, MD 01/22/2016, 11:38 AM PGY-1, [place] Intern pager: [phone], text pages welcome

## 2016-01-22 NOTE — Progress Notes (Signed)
Preliminary results by tech - Carotid Duplex Completed. Mild plaque noted in both internal carotid arteries without evidence of stenosis. Oda Cogan, BS, RDMS, RVT

## 2016-01-22 NOTE — Evaluation (Addendum)
Occupational Therapy Evaluation and Discharge Patient Details Name: Dennis Ortel Blubaugh Sr. MRN: [id number] DOB: 02/21/1946 Today's Date: 01/22/2016    History of Present Illness Acute onset of right sided weaknessMRI: Fewtiny acute ischemic nonhemorrhagic right MCA territory infarcts. PMHx: stroke (2007, 2013)   Clinical Impression   This 70 yo male admitted with above presents to acute OT at a Mod I to independent level with basic ADLs. No further OT needs we will sign off.    Follow Up Recommendations  No OT follow up    Equipment Recommendations  None recommended by OT       Precautions / Restrictions Precautions Precautions: None Restrictions Weight Bearing Restrictions: No      Mobility Bed Mobility Overal bed mobility: Modified Independent             General bed mobility comments: increased time  Transfers Overall transfer level: Needs assistance   Transfers: Sit to/from Stand Sit to Stand: Modified independent (Device/Increase time)                   ADL Overall ADL's : Modified independent                                                       Pertinent Vitals/Pain Pain Assessment: No/denies pain     Hand Dominance Left   Extremity/Trunk Assessment Upper Extremity Assessment Upper Extremity Assessment: Defer to OT evaluation           Communication Communication Communication: No difficulties   Cognition Arousal/Alertness: Awake/alert Behavior During Therapy: WFL for tasks assessed/performed Overall Cognitive Status: Within Functional Limits for tasks assessed                    Vision: no issues with formal testing              Home Living Family/patient expects to be discharged to:: Private residence Living Arrangements: Spouse/significant other Available Help at Discharge: Family;Available PRN/intermittently Type of Home: House Home Access: Stairs to enter [employer] of  Steps: several Entrance Stairs-Rails: Right;Left Home Layout: Two level;Able to live on main level with bedroom/bathroom     Bathroom Shower/Tub: Walk-in shower;Curtain   Bathroom Toilet: Handicapped height     Home Equipment: Clinical cytogeneticist - 2 wheels;Bedside commode;Hand held shower head          Prior Functioning/Environment Level of Independence: Independent        Comments: works as a Nurse, mental health Diagnosis: Generalized weakness         OT Goals(Current goals can be found in the care plan section) Acute Rehab OT Goals Patient Stated Goal: to get back work-conference call today to [place]  OT Frequency:         Co-evaluation PT/OT/SLP Co-Evaluation/Treatment: Yes Reason for Co-Treatment: For patient/therapist safety PT goals addressed during session: Mobility/safety with mobility        End of Session Equipment Utilized During Treatment:  (none)  Activity Tolerance: Patient tolerated treatment well Patient left: in chair;with call bell/phone within reach (no chair alarm (had pad, but no box)--made NT aware)   Time: 1004-1026 OT Time Calculation (min): 22 min Charges:  OT General Charges $OT Visit: 1 Procedure OT Evaluation $OT Eval Low Complexity: 1 Procedure $OT Eval Moderate Complexity: 1 Procedure G-Codes: OT G-codes **  NOT FOR INPATIENT CLASS** Functional Assessment Tool Used: Clinical observation Functional Limitation: Self care Self Care Current Status [id number]): At least 1 percent but less than 20 percent impaired, limited or restricted Self Care Goal Status [id number]): At least 1 percent but less than 20 percent impaired, limited or restricted Self Care Discharge Status [phone]): At least 1 percent but less than 20 percent impaired, limited or restricted  Almon Register [id number] 01/22/2016, 10:45 AM

## 2016-01-23 MED ORDER — ASPIRIN 325 MG PO TABS: 325.0000 mg | ORAL_TABLET | Freq: Every day | ORAL | Status: DC

## 2016-01-23 MED ORDER — ATORVASTATIN CALCIUM 80 MG PO TABS: 80.0000 mg | ORAL_TABLET | Freq: Every day | ORAL | Status: DC

## 2016-01-23 NOTE — Discharge Instructions (Signed)
You were admitted to the hospital for weakness of your right arm and right leg as well as some facial drooping on the right side of your face.  A medical work up determined that you had a stroke involving a vessel in the left part of your brain (left middle cerebral artery).  You had an echocardiogram that did not show any significant abnormalities and you also had a ultrasound of your carotid arteries in your neck and this was also normal.  Neurology was following your case throughout your admission and recommend a follow up appointment to try to determine the cause of your stroke.  We highly recommend that you schedule a follow up appointment with a Cardiologist for a TEE or loop recorder study to look for an embolic source of your stroke.  We changed your lipitor to 80mg  per day and have started you on 325 mg of aspirin and have provided you with prescriptions for both.

## 2016-01-23 NOTE — Progress Notes (Signed)
Pt discharging at this time with wife taking all personal belongings. Discharge instructions provided with verbal understanding. Pt denies pain or discomfort. No noted distress.

## 2016-01-23 NOTE — Progress Notes (Signed)
Pt stated during assessment that he had left hip procedure three weeks ago. Stated that he has some discomfort at times. No pain at this time. Will continue to monitor. Safety measures in place. Call bell within reach.

## 2016-01-23 NOTE — Progress Notes (Signed)
Pt. found on CPAP upon my arrival, sleeping, tolerating well.

## 2016-01-25 ENCOUNTER — Encounter (HOSPITAL_COMMUNITY): Payer: Self-pay | Admitting: Emergency Medicine

## 2016-01-25 ENCOUNTER — Emergency Department (HOSPITAL_COMMUNITY): Payer: Medicare Other

## 2016-01-25 ENCOUNTER — Telehealth: Payer: Self-pay | Admitting: Cardiovascular Disease

## 2016-01-25 ENCOUNTER — Inpatient Hospital Stay (HOSPITAL_COMMUNITY)
Admission: EM | Admit: 2016-01-25 | Discharge: 2016-01-28 | DRG: 041 | Disposition: A | Discharge status: Home / Self Care | Payer: Medicare Other | Attending: Family Medicine | Admitting: Family Medicine

## 2016-01-25 DIAGNOSIS — R531 Weakness: Secondary | ICD-10-CM

## 2016-01-25 DIAGNOSIS — Z888 Allergy status to other drugs, medicaments and biological substances status: Secondary | ICD-10-CM

## 2016-01-25 DIAGNOSIS — I959 Hypotension, unspecified: Secondary | ICD-10-CM | POA: Diagnosis present

## 2016-01-25 DIAGNOSIS — G8191 Hemiplegia, unspecified affecting right dominant side: Secondary | ICD-10-CM | POA: Diagnosis present

## 2016-01-25 DIAGNOSIS — N179 Acute kidney failure, unspecified: Secondary | ICD-10-CM | POA: Diagnosis present

## 2016-01-25 DIAGNOSIS — M545 Low back pain: Secondary | ICD-10-CM | POA: Diagnosis present

## 2016-01-25 DIAGNOSIS — I252 Old myocardial infarction: Secondary | ICD-10-CM

## 2016-01-25 DIAGNOSIS — M1A9XX1 Chronic gout, unspecified, with tophus (tophi): Secondary | ICD-10-CM | POA: Diagnosis present

## 2016-01-25 DIAGNOSIS — G8929 Other chronic pain: Secondary | ICD-10-CM | POA: Diagnosis present

## 2016-01-25 DIAGNOSIS — I4891 Unspecified atrial fibrillation: Secondary | ICD-10-CM | POA: Diagnosis present

## 2016-01-25 DIAGNOSIS — E860 Dehydration: Secondary | ICD-10-CM | POA: Diagnosis present

## 2016-01-25 DIAGNOSIS — Z8249 Family history of ischemic heart disease and other diseases of the circulatory system: Secondary | ICD-10-CM

## 2016-01-25 DIAGNOSIS — E039 Hypothyroidism, unspecified: Secondary | ICD-10-CM | POA: Diagnosis present

## 2016-01-25 DIAGNOSIS — Z79899 Other long term (current) drug therapy: Secondary | ICD-10-CM

## 2016-01-25 DIAGNOSIS — I5032 Chronic diastolic (congestive) heart failure: Secondary | ICD-10-CM | POA: Diagnosis present

## 2016-01-25 DIAGNOSIS — R29810 Facial weakness: Secondary | ICD-10-CM | POA: Diagnosis present

## 2016-01-25 DIAGNOSIS — G4733 Obstructive sleep apnea (adult) (pediatric): Secondary | ICD-10-CM | POA: Diagnosis present

## 2016-01-25 DIAGNOSIS — R29703 NIHSS score 3: Secondary | ICD-10-CM | POA: Diagnosis present

## 2016-01-25 DIAGNOSIS — I2510 Atherosclerotic heart disease of native coronary artery without angina pectoris: Secondary | ICD-10-CM | POA: Diagnosis present

## 2016-01-25 DIAGNOSIS — F329 Major depressive disorder, single episode, unspecified: Secondary | ICD-10-CM | POA: Diagnosis present

## 2016-01-25 DIAGNOSIS — I6623 Occlusion and stenosis of bilateral posterior cerebral arteries: Secondary | ICD-10-CM | POA: Diagnosis not present

## 2016-01-25 DIAGNOSIS — Z6841 Body Mass Index (BMI) 40.0 and over, adult: Secondary | ICD-10-CM

## 2016-01-25 DIAGNOSIS — I63413 Cerebral infarction due to embolism of bilateral middle cerebral arteries: Secondary | ICD-10-CM | POA: Diagnosis not present

## 2016-01-25 DIAGNOSIS — M6289 Other specified disorders of muscle: Secondary | ICD-10-CM | POA: Diagnosis not present

## 2016-01-25 DIAGNOSIS — Q211 Atrial septal defect: Secondary | ICD-10-CM | POA: Diagnosis not present

## 2016-01-25 DIAGNOSIS — I952 Hypotension due to drugs: Secondary | ICD-10-CM | POA: Insufficient documentation

## 2016-01-25 DIAGNOSIS — I110 Hypertensive heart disease with heart failure: Secondary | ICD-10-CM | POA: Diagnosis present

## 2016-01-25 DIAGNOSIS — I10 Essential (primary) hypertension: Secondary | ICD-10-CM | POA: Insufficient documentation

## 2016-01-25 DIAGNOSIS — I639 Cerebral infarction, unspecified: Secondary | ICD-10-CM | POA: Diagnosis not present

## 2016-01-25 DIAGNOSIS — N390 Urinary tract infection, site not specified: Secondary | ICD-10-CM | POA: Diagnosis present

## 2016-01-25 DIAGNOSIS — R51 Headache: Secondary | ICD-10-CM | POA: Diagnosis not present

## 2016-01-25 DIAGNOSIS — Z91030 Bee allergy status: Secondary | ICD-10-CM

## 2016-01-25 DIAGNOSIS — Z823 Family history of stroke: Secondary | ICD-10-CM

## 2016-01-25 DIAGNOSIS — R002 Palpitations: Secondary | ICD-10-CM | POA: Diagnosis present

## 2016-01-25 DIAGNOSIS — I6930 Unspecified sequelae of cerebral infarction: Secondary | ICD-10-CM | POA: Diagnosis not present

## 2016-01-25 DIAGNOSIS — Z96651 Presence of right artificial knee joint: Secondary | ICD-10-CM | POA: Diagnosis present

## 2016-01-25 DIAGNOSIS — M6281 Muscle weakness (generalized): Secondary | ICD-10-CM | POA: Diagnosis not present

## 2016-01-25 DIAGNOSIS — Z981 Arthrodesis status: Secondary | ICD-10-CM

## 2016-01-25 DIAGNOSIS — I6611 Occlusion and stenosis of right anterior cerebral artery: Secondary | ICD-10-CM | POA: Diagnosis not present

## 2016-01-25 DIAGNOSIS — Z833 Family history of diabetes mellitus: Secondary | ICD-10-CM

## 2016-01-25 DIAGNOSIS — Z91013 Allergy to seafood: Secondary | ICD-10-CM

## 2016-01-25 DIAGNOSIS — Z7982 Long term (current) use of aspirin: Secondary | ICD-10-CM

## 2016-01-25 DIAGNOSIS — E785 Hyperlipidemia, unspecified: Secondary | ICD-10-CM | POA: Diagnosis not present

## 2016-01-25 DIAGNOSIS — Z8546 Personal history of malignant neoplasm of prostate: Secondary | ICD-10-CM

## 2016-01-25 LAB — DIFFERENTIAL
Basophils Absolute: 0 10*3/uL (ref 0.0–0.1)
Basophils Relative: 0 %
Eosinophils Absolute: 0.2 10*3/uL (ref 0.0–0.7)
Eosinophils Relative: 3 %
Lymphocytes Relative: 25 %
Lymphs Abs: 1.6 10*3/uL (ref 0.7–4.0)
Monocytes Absolute: 0.3 10*3/uL (ref 0.1–1.0)
Monocytes Relative: 5 %
Neutro Abs: 4.2 10*3/uL (ref 1.7–7.7)
Neutrophils Relative %: 67 %

## 2016-01-25 LAB — COMPREHENSIVE METABOLIC PANEL
ALT: 17 U/L (ref 17–63)
AST: 17 U/L (ref 15–41)
Albumin: 4.4 g/dL (ref 3.5–5.0)
Alkaline Phosphatase: 57 U/L (ref 38–126)
Anion gap: 8 (ref 5–15)
BUN: 25 mg/dL — ABNORMAL HIGH (ref 6–20)
CO2: 23 mmol/L (ref 22–32)
Calcium: 10 mg/dL (ref 8.9–10.3)
Chloride: 103 mmol/L (ref 101–111)
Creatinine, Ser: 1.6 mg/dL — ABNORMAL HIGH (ref 0.61–1.24)
GFR calc Af Amer: 49 mL/min — ABNORMAL LOW (ref >60)
GFR calc non Af Amer: 42 mL/min — ABNORMAL LOW (ref >60)
Glucose, Bld: 101 mg/dL — ABNORMAL HIGH (ref 65–99)
Potassium: 4.1 mmol/L (ref 3.5–5.1)
Sodium: 134 mmol/L — ABNORMAL LOW (ref 135–145)
Total Bilirubin: 1.8 mg/dL — ABNORMAL HIGH (ref 0.3–1.2)
Total Protein: 7.9 g/dL (ref 6.5–8.1)

## 2016-01-25 LAB — URINALYSIS, ROUTINE W REFLEX MICROSCOPIC
Bilirubin Urine: NEGATIVE
Glucose, UA: NEGATIVE mg/dL
Hgb urine dipstick: NEGATIVE
Ketones, ur: NEGATIVE mg/dL
Leukocytes, UA: NEGATIVE
Nitrite: NEGATIVE
Protein, ur: NEGATIVE mg/dL
Specific Gravity, Urine: 1.014 (ref 1.005–1.030)
pH: 6.5 (ref 5.0–8.0)

## 2016-01-25 LAB — RAPID URINE DRUG SCREEN, HOSP PERFORMED
Amphetamines: NOT DETECTED
Barbiturates: NOT DETECTED
Benzodiazepines: NOT DETECTED
Cocaine: NOT DETECTED
Opiates: NOT DETECTED
Tetrahydrocannabinol: NOT DETECTED

## 2016-01-25 LAB — PROTIME-INR
INR: 1.17 (ref 0.00–1.49)
Prothrombin Time: 15.1 seconds (ref 11.6–15.2)

## 2016-01-25 LAB — I-STAT TROPONIN, ED: Troponin i, poc: 0 ng/mL (ref 0.00–0.08)

## 2016-01-25 LAB — I-STAT CHEM 8, ED
BUN: 31 mg/dL — ABNORMAL HIGH (ref 6–20)
Calcium, Ion: 1.21 mmol/L (ref 1.12–1.23)
Chloride: 101 mmol/L (ref 101–111)
Creatinine, Ser: 1.6 mg/dL — ABNORMAL HIGH (ref 0.61–1.24)
Glucose, Bld: 101 mg/dL — ABNORMAL HIGH (ref 65–99)
HCT: 48 % (ref 39.0–52.0)
Hemoglobin: 16.3 g/dL (ref 13.0–17.0)
Potassium: 4.2 mmol/L (ref 3.5–5.1)
Sodium: 138 mmol/L (ref 135–145)
TCO2: 25 mmol/L (ref 0–100)

## 2016-01-25 LAB — HEMOGLOBIN A1C
Hgb A1c MFr Bld: 5.8 % — ABNORMAL HIGH (ref 4.8–5.6)
Mean Plasma Glucose: 120 mg/dL

## 2016-01-25 LAB — CBC
HCT: 47.8 % (ref 39.0–52.0)
Hemoglobin: 15.8 g/dL (ref 13.0–17.0)
MCH: 28.8 pg (ref 26.0–34.0)
MCHC: 33.1 g/dL (ref 30.0–36.0)
MCV: 87.2 fL (ref 78.0–100.0)
Platelets: 181 10*3/uL (ref 150–400)
RBC: 5.48 MIL/uL (ref 4.22–5.81)
RDW: 13.6 % (ref 11.5–15.5)
WBC: 6.2 10*3/uL (ref 4.0–10.5)

## 2016-01-25 LAB — APTT: aPTT: 33 seconds (ref 24–37)

## 2016-01-25 LAB — ETHANOL: Alcohol, Ethyl (B): <5 mg/dL (ref <5)

## 2016-01-25 MED ORDER — CIPROFLOXACIN HCL 500 MG PO TABS
500.0000 mg | ORAL_TABLET | Freq: Every day | ORAL | Status: DC
  Administered 2016-01-26 – 2016-01-28 (×2): 500 mg via ORAL
  Filled 2016-01-25 (×2): qty 1

## 2016-01-25 MED ORDER — SODIUM CHLORIDE 0.9% FLUSH
3.0000 mL | Freq: Two times a day (BID) | INTRAVENOUS | Status: DC
  Administered 2016-01-25 – 2016-01-27 (×4): 3 mL via INTRAVENOUS

## 2016-01-25 MED ORDER — ATORVASTATIN CALCIUM 80 MG PO TABS
80.0000 mg | ORAL_TABLET | Freq: Every day | ORAL | Status: DC
  Administered 2016-01-26 – 2016-01-28 (×2): 80 mg via ORAL
  Filled 2016-01-25 (×2): qty 1

## 2016-01-25 MED ORDER — ONDANSETRON HCL 4 MG/2ML IJ SOLN
4.0000 mg | Freq: Once | INTRAMUSCULAR | Status: AC
  Administered 2016-01-25: 4 mg via INTRAVENOUS

## 2016-01-25 MED ORDER — FUROSEMIDE 20 MG PO TABS: 20.0000 mg | ORAL_TABLET | Freq: Every day | ORAL | Status: DC

## 2016-01-25 MED ORDER — ONDANSETRON HCL 4 MG/2ML IJ SOLN
INTRAMUSCULAR | Status: AC
  Administered 2016-01-25: 4 mg via INTRAVENOUS
  Filled 2016-01-25: qty 2

## 2016-01-25 MED ORDER — ASPIRIN 325 MG PO TABS
325.0000 mg | ORAL_TABLET | Freq: Every day | ORAL | Status: DC
  Administered 2016-01-26 – 2016-01-28 (×2): 325 mg via ORAL
  Filled 2016-01-25 (×2): qty 1

## 2016-01-25 MED ORDER — IOPAMIDOL (ISOVUE-370) INJECTION 76%
INTRAVENOUS | Status: DC
Start: 2016-01-25 — End: 2016-01-25
  Filled 2016-01-25: qty 100

## 2016-01-25 MED ORDER — ESCITALOPRAM OXALATE 10 MG PO TABS
20.0000 mg | ORAL_TABLET | Freq: Every day | ORAL | Status: DC
  Administered 2016-01-25 – 2016-01-28 (×3): 20 mg via ORAL
  Filled 2016-01-25 (×3): qty 2

## 2016-01-25 MED ORDER — LORAZEPAM 2 MG/ML IJ SOLN
1.0000 mg | Freq: Once | INTRAMUSCULAR | Status: DC
  Filled 2016-01-25: qty 1

## 2016-01-25 MED ORDER — SODIUM CHLORIDE 0.9 % IV BOLUS (SEPSIS)
1000.0000 mL | Freq: Once | INTRAVENOUS | Status: AC
  Administered 2016-01-25: 1000 mL via INTRAVENOUS

## 2016-01-25 MED ORDER — LEVOTHYROXINE SODIUM 100 MCG PO TABS
200.0000 ug | ORAL_TABLET | Freq: Every day | ORAL | Status: DC
  Administered 2016-01-26 – 2016-01-28 (×2): 200 ug via ORAL
  Filled 2016-01-25 (×2): qty 2

## 2016-01-25 MED ORDER — COLCHICINE 0.6 MG PO TABS
0.6000 mg | ORAL_TABLET | Freq: Two times a day (BID) | ORAL | Status: DC
  Administered 2016-01-25 – 2016-01-28 (×5): 0.6 mg via ORAL
  Filled 2016-01-25 (×5): qty 1

## 2016-01-25 MED ORDER — OXYCODONE-ACETAMINOPHEN 5-325 MG PO TABS
1.0000 | ORAL_TABLET | ORAL | Status: DC | PRN
  Administered 2016-01-27 (×2): 1 via ORAL
  Filled 2016-01-25 (×2): qty 1

## 2016-01-25 MED ORDER — ALLOPURINOL 100 MG PO TABS
100.0000 mg | ORAL_TABLET | Freq: Two times a day (BID) | ORAL | Status: DC
  Administered 2016-01-25 – 2016-01-28 (×5): 100 mg via ORAL
  Filled 2016-01-25 (×5): qty 1

## 2016-01-25 MED ORDER — ONDANSETRON HCL 4 MG/2ML IJ SOLN: 4.0000 mg | Freq: Once | INTRAMUSCULAR | Status: DC | PRN

## 2016-01-25 MED ORDER — SODIUM CHLORIDE 0.9 % IV SOLN
INTRAVENOUS | Status: DC
  Administered 2016-01-25: 1000 mL via INTRAVENOUS

## 2016-01-25 NOTE — Telephone Encounter (Signed)
No answer, voice mail 

## 2016-01-25 NOTE — ED Notes (Signed)
Patient present today states at 1100 he started having increase weakness to the right-side, patient then started having blurred vision 30 mins prior to arrival. Patient has a stroke last Thursday with increase weakness. When patient was placed on CT table patient started complaining of headache and nausea.
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
## 2016-01-25 NOTE — Code Documentation (Signed)
70yo male arriving to [facility] via private vehicle at 1340.  Patient from home where he had acute onset of worsening right sided weakness at 1100 as well as blurred vision.  Patient with h/o recent left MCA infarcts on 01/21/2016 with right sided weakness.  Patient reports being discharged home with improvement on 01/23/2016.  Code stroke called on patient arrival.  Patient to CT.  Stroke team to the bedside.  CT completed.  NIHSS 3, see documentation for details and code stroke times.  Patient with mild right facial droop, right arm drift and right sided decreased sensation on exam.  Patient is contraindicated for tPA d/t h/o recent infarcts.  No acute stroke treatment at this time.  CTA ordered per MD.  Bedside handoff with ED RN Danelle Berry.
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
## 2016-01-25 NOTE — ED Notes (Signed)
Patient requesting pillows for comfort - told patient we were getting ready to take him to the floor

## 2016-01-25 NOTE — Telephone Encounter (Signed)
Patient called to report he is going to go to [facility] because he thinks he maybe having a stroke. He has right arm weakness and tingling and L LE weakness. He is unclear when symptoms started, but initial call was placed at 1232 today. Encouraged patient to call EMS, but he refused. He is waiting for his son to arrive to drive him. Called [facility] ED and informed them a possible Code Stroke will be reporting to the ED soon. Called patient and informed him the ED has been notified. He states he is in [place] now and his "vision is going." Notified ED that patient is closer than previously thought. Intake RN was grateful for call.

## 2016-01-25 NOTE — ED Provider Notes (Signed)
Level V caveat urgency of situation. Patient felt sudden onset right arm and right leg weakness and blurred vision in both eyes right greater than left 11 AM today, similar to stroke she's had in the past. On exam patient alert Glasgow Coma Score 15 moves all extremities well. Code stroke called on arrival here. NIH stroke scale calculated as 3.  Orlie Dakin, MD 01/25/16 1626

## 2016-01-25 NOTE — ED Provider Notes (Signed)
Care assumed from Dr. Liston Alba at 1700. Please see his note for initial evaluation.  Patient with recent history of stroke and right-sided deficits that resolved in the last week presenting for reactivation of his symptoms. CT of the head does not show any evidence of new insults. MRI brain pending. If new findings we'll plan to admit to neurology. Plan is to admit to family medicine for further evaluation with neurology to follow.   MRI shows new ischemia surrounding the previous infarct. Patient continues to have partial deficits of right upper extremity. Normal vital signs throughout his stay in the emergency department. He was admitted in fair condition.  Allie Bossier, MD 01/25/16 1943  Noemi Chapel, MD 01/26/16 [phone]

## 2016-01-25 NOTE — Telephone Encounter (Signed)
Dennis [name] called because he was in the hospital for a clot to the brain . Was Discharged and was told to have his cardiologist schedule a TEE. He called again to let us know that his right arm has gone numb again and he is going to go to the E/R

## 2016-01-25 NOTE — ED Notes (Signed)
Patient eating sandwich from [place]

## 2016-01-25 NOTE — Progress Notes (Signed)
Pt arrived around 2030 alert and oriented with rt side weakness and some tingling some pain in right leg, q 2 neuro/vitals started and scd's are on. Will continue to monitor.

## 2016-01-25 NOTE — ED Notes (Signed)
Pt returned from MRI. Pt hooked to monitors.

## 2016-01-25 NOTE — ED Notes (Signed)
Patient taken to 80M

## 2016-01-25 NOTE — Consult Note (Signed)
Neurology Consultation Reason for Consult: Increasing right-sided weakness Referring Physician: Lyn Hollingshead  CC: Increasing right-sided weakness  History is obtained from: Patient  HPI: Dennis Semper Nicklin Sr. is a 70 y.o. male recently admitted with multifocal infarcts in the right MCA distribution. Recommendation including TEE and Loop recorder were made. Given timing, and this was thought to be able to be accomplished as an outpatient. He also complains of some blurred vision as well.   LKW: 11 AM tpa given?: no, recent stroke    ROS: A 14 point ROS was performed and is negative except as noted in the HPI.   Past Medical History  Diagnosis Date  . Complication of anesthesia     Sometimes has N&V /w anesth.   Marland Kitchen PONV (postoperative nausea and vomiting)   . Hypertension   . Hypothyroidism   . Arthritis     low back - DDD  . History of chronic bronchitis   . Chronic lower back pain     "from [place] 2007"  . History of gout   . Hypocholesteremia   . Elevated PSA   . Myocardial infarction [facility]) 2001    2001- cardiac cath., cardiac clearanece note dr Otho Perl 05-14-13 on chart, stress test results 02-21-12 on chart  . Pneumonia 2000's and 2013  . OSA on CPAP     cpap setting of 10  . Stroke [facility]) 2004    "right brain stem; no residual "  . Coronary artery disease     Cath 2001  . [facility] spotted fever   . Kidney stone   . CHF (congestive heart failure) (HCC)      Family History  Problem Relation Age of Onset  . Cervical cancer Mother   . Diabetes type II Mother   . Hypertension Mother   . Stroke Mother   . Dementia Father   . Diabetes type II Sister   . Hypertension Sister   . CAD Sister   . Heart attack Mother      Social History:  reports that he has never smoked. He has never used smokeless tobacco. He reports that he drinks alcohol. He reports that he does not use illicit drugs.   Exam: Current vital signs: BP 111/55 mmHg  Pulse 62  Temp(Src) 98.5 F  (36.9 C) (Oral)  Resp 12  SpO2 98% Vital signs in last 24 hours: Temp:  [98.5 F (36.9 C)] 98.5 F (36.9 C) (07/17 1414) Pulse Rate:  [62] 62 (07/17 1414) Resp:  [12] 12 (07/17 1421) BP: (111)/(55) 111/55 mmHg (07/17 1421) SpO2:  [98 %] 98 % (07/17 1421)   Physical Exam  Constitutional: Appears well-developed and well-nourished.  Psych: Affect appropriate to situation Eyes: No scleral injection HENT: No OP obstrucion Head: Normocephalic.  Cardiovascular: Normal rate and regular rhythm.  Respiratory: Effort normal and breath sounds normal to anterior ascultation GI: Soft.  No distension. There is no tenderness.  Skin: WDI  Neuro: Mental Status: Patient is awake, alert, oriented to person, place, month, year, and situation. Patient is able to give a clear and coherent history. No signs of aphasia or neglect Cranial Nerves: II: Visual Fields are full. Pupils are equal, round, and reactive to light.   III,IV, VI: EOMI without ptosis or diploplia.  V: Facial sensation is symmetric to temperature VII: Facial movement is notable for mild weakness on the right VIII: hearing is intact to voice X: Uvula elevates symmetrically XI: Shoulder shrug is symmetric. XII: tongue is midline without atrophy  or fasciculations.  Motor: Tone is normal. Bulk is normal. 5/5 strength was present on the left side, he has 4/5 strength throughout the right, but no drift in the lower Sensory: Sensation is decreased on the right Cerebellar: FNF and HKS are intact bilaterally    I have reviewed labs in epic and the results pertinent to this consultation are: Creatinine 1.6  I have reviewed the images obtained: CT head-negative  Impression: 70 year old male with recent strokes and now worsening of his right-sided weakness. Possibilities include recrudescence due to his acute kidney injury(creatinine 1.6, up from 1.1 last week), recurrent stroke, hypoperfusion due to  dehydration.  Recommendations: 1) MRI brain, MRA head 2) if there is new significant infarct, then patient will likely need be admitted with permissive hypertension and a consider completion stroke workup with TEE and Loop recorder 3) continue ASA 4) neurology will follow   Roland Rack, MD [facility] [phone]  If 7pm- 7am, please page neurology on call as listed in [place].

## 2016-01-26 ENCOUNTER — Inpatient Hospital Stay: Payer: Medicare Other | Admitting: Family Medicine

## 2016-01-26 DIAGNOSIS — I952 Hypotension due to drugs: Secondary | ICD-10-CM | POA: Insufficient documentation

## 2016-01-26 DIAGNOSIS — E785 Hyperlipidemia, unspecified: Secondary | ICD-10-CM | POA: Insufficient documentation

## 2016-01-26 DIAGNOSIS — I10 Essential (primary) hypertension: Secondary | ICD-10-CM | POA: Insufficient documentation

## 2016-01-26 DIAGNOSIS — N179 Acute kidney failure, unspecified: Secondary | ICD-10-CM | POA: Insufficient documentation

## 2016-01-26 DIAGNOSIS — M6289 Other specified disorders of muscle: Secondary | ICD-10-CM

## 2016-01-26 DIAGNOSIS — I63413 Cerebral infarction due to embolism of bilateral middle cerebral arteries: Secondary | ICD-10-CM

## 2016-01-26 DIAGNOSIS — R002 Palpitations: Secondary | ICD-10-CM | POA: Insufficient documentation

## 2016-01-26 DIAGNOSIS — I639 Cerebral infarction, unspecified: Secondary | ICD-10-CM | POA: Insufficient documentation

## 2016-01-26 LAB — BASIC METABOLIC PANEL
Anion gap: 5 (ref 5–15)
BUN: 22 mg/dL — ABNORMAL HIGH (ref 6–20)
CO2: 29 mmol/L (ref 22–32)
Calcium: 9.7 mg/dL (ref 8.9–10.3)
Chloride: 103 mmol/L (ref 101–111)
Creatinine, Ser: 1.45 mg/dL — ABNORMAL HIGH (ref 0.61–1.24)
GFR calc Af Amer: 55 mL/min — ABNORMAL LOW (ref >60)
GFR calc non Af Amer: 47 mL/min — ABNORMAL LOW (ref >60)
Glucose, Bld: 124 mg/dL — ABNORMAL HIGH (ref 65–99)
Potassium: 4.8 mmol/L (ref 3.5–5.1)
Sodium: 137 mmol/L (ref 135–145)
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
## 2016-01-26 NOTE — Progress Notes (Signed)
  Patient is scheduled for TEE 01/27/16 for CVA. I explained purpose as well as procedural details, including potential risk with patient and wife. All questions and concerns were addressed. He gave verbal consent. RN to obtain written consent.   Brittainy Rosita Fire 01/26/2016

## 2016-01-26 NOTE — Evaluation (Signed)
Physical Therapy Evaluation Patient Details Name: Dennis Householder Pellot Sr. MRN: [id number] DOB: 08/16/1945 Today's Date: 01/26/2016   History of Present Illness  Patient is a 70 y/o male with recent admission for CVA 7/14, prostate and testicular CA, gout, recurrent UTIs, HTN, MI, CAD, CHF, [place] mtn spotted fever, OSA on CPAP presents with worsening right sided weakness, blurry vision. MRI/MRA Head-increase in size of infarct noted on imaging during last admission (7/13), as well as two new smaller infarcts-left lenticular nucleus and new small infarct left motor strip   Clinical Impression  Patient presents with right sided weakness, decreased sensation RUE/LE and imbalance s/p above impacting mobility. Tolerated gait training with Min A for balance/safety. Would benefit from AD for UE support during mobility. Pt recently d/ced from hospital a few days ago and upset about recurrent strokes. Pt lives with wife and son and independent PTA. Will follow acutely to maximize independence and mobility prior to return home.     Follow Up Recommendations Supervision - Intermittent;Outpatient PT    Equipment Recommendations  Other (comment) (TBA)    Recommendations for Other Services       Precautions / Restrictions Precautions Precautions: Fall Restrictions Weight Bearing Restrictions: No      Mobility  Bed Mobility Overal bed mobility: Modified Independent;Needs Assistance Bed Mobility: Supine to Sit;Sit to Supine     Supine to sit: Modified independent (Device/Increase time) Sit to supine: Modified independent (Device/Increase time)   General bed mobility comments: increased time; use of rails.  Transfers Overall transfer level: Needs assistance Equipment used: None Transfers: Sit to/from Stand Sit to Stand: Min guard         General transfer comment: Not able to stand on first attenpt, "wow that was different," Able to stand on second attempt with close mIn guard due to  difficulty, weakness. + light headedness.  Ambulation/Gait Ambulation/Gait assistance: Min assist Ambulation Distance (Feet): 120 Feet Assistive device: None Gait Pattern/deviations: Step-through pattern;Decreased stride length;Decreased stance time - right;Decreased step length - left   Gait velocity interpretation: Below normal speed for age/gender General Gait Details: Slow, unsteady and guarded gait. Instability noted in RLE but no knee buckling. Feels lightheaded. Declined using rail for support but would benefit from UE support.  Stairs            Wheelchair Mobility    Modified Rankin (Stroke Patients Only) Modified Rankin (Stroke Patients Only) Pre-Morbid Rankin Score: No significant disability Modified Rankin: Moderately severe disability     Balance Overall balance assessment: Needs assistance Sitting-balance support: Feet supported;No upper extremity supported Sitting balance-Leahy Scale: Good     Standing balance support: During functional activity Standing balance-Leahy Scale: Fair                               Pertinent Vitals/Pain Pain Assessment: No/denies pain    Home Living Family/patient expects to be discharged to:: Private residence Living Arrangements: Spouse/significant other;Children Available Help at Discharge: Family;Available PRN/intermittently Type of Home: House Home Access: Stairs to enter Entrance Stairs-Rails: Right;Left Entrance Stairs-Number of Steps: 4 Home Layout: Two level;Able to live on main level with bedroom/bathroom Home Equipment: Shower seat;Walker - 2 wheels;Bedside commode;Hand held shower head;Crutches;Cane - single point      Prior Function Level of Independence: Independent         Comments: works as a Systems developer   Dominant Hand: Left    Extremity/Trunk Assessment  Upper Extremity Assessment: Defer to OT evaluation (Weak grip right hand. Grossly weaker RUE  throughout but functional.)           Lower Extremity Assessment: RLE deficits/detail RLE Deficits / Details: Grossly ~4/5 throughout except 2+/5 hip flexion.     Cervical / Trunk Assessment: Normal  Communication   Communication: Expressive difficulties (Reports difficulty with names since recent CVA a few days ago.)  Cognition Arousal/Alertness: Awake/alert Behavior During Therapy: WFL for tasks assessed/performed Overall Cognitive Status: Within Functional Limits for tasks assessed                      General Comments      Exercises        Assessment/Plan    PT Assessment Patient needs continued PT services  PT Diagnosis Generalized weakness;Difficulty walking   PT Problem List Decreased strength;Decreased range of motion;Impaired sensation;Decreased activity tolerance;Decreased balance;Decreased mobility  PT Treatment Interventions Balance training;Gait training;Stair training;Functional mobility training;Therapeutic activities;Therapeutic exercise;Patient/family education   PT Goals (Current goals can be found in the Care Plan section) Acute Rehab PT Goals Patient Stated Goal: to get back to independence PT Goal Formulation: With patient Time For Goal Achievement: 02/09/16 Potential to Achieve Goals: Fair    Frequency Min 4X/week   Barriers to discharge Inaccessible home environment;Decreased caregiver support not sure of level of support he can have at home, son waiting on MCAT results, wife works. Stairs to enter home.    Co-evaluation               End of Session Equipment Utilized During Treatment: Gait belt Activity Tolerance: Patient tolerated treatment well Patient left: in bed;with call bell/phone within reach;with nursing/sitter in room (getting EKG with tech) Nurse Communication: Mobility status         Time: [id number] PT Time Calculation (min) (ACUTE ONLY): 23 min   Charges:   PT Evaluation $PT Eval Moderate Complexity: 1  Procedure PT Treatments $Gait Training: 8-22 mins   PT G Codes:        Delyla Sandeen A Mitchelle Sultan 01/26/2016, 9:37 AM Wray Kearns, [place], DPT [phone]

## 2016-01-26 NOTE — Evaluation (Signed)
Occupational Therapy Evaluation Patient Details Name: Dennis Prawl Piche Sr. MRN: [id number] DOB: May 06, 1946 Today's Date: 01/26/2016    History of Present Illness Patient is a 70 y/o male with recent admission for CVA 7/14, prostate and testicular CA, gout, recurrent UTIs, HTN, MI, CAD, CHF, [place] mtn spotted fever, OSA on CPAP presents with worsening right sided weakness, blurry vision. MRI/MRA Head-increase in size of infarct noted on imaging during last admission (7/13), as well as two new smaller infarcts-left lenticular nucleus and new small infarct left motor strip    Clinical Impression   Patient evaluated by Occupational Therapy with no further acute OT needs identified. All education has been completed and the patient has no further questions. See below for any follow-up Occupational Therapy or equipment needs. OT to sign off. Thank you for referral.   All needed equipment provided below and handouts to help patient complete HEP. Wife present for all education.     Follow Up Recommendations  No OT follow up    Equipment Recommendations  None recommended by OT    Recommendations for Other Services       Precautions / Restrictions Precautions Precautions: Fall      Mobility Bed Mobility Overal bed mobility: Modified Independent;Needs Assistance                Transfers                      Balance                                            ADL Overall ADL's : Modified independent                                       General ADL Comments: session focused on exercise programs for home see belwo     Vision     Perception     Praxis      Pertinent Vitals/Pain Pain Assessment: No/denies pain     Hand Dominance Left   Extremity/Trunk Assessment Upper Extremity Assessment Upper Extremity Assessment: RUE deficits/detail RUE Deficits / Details: shoulder flexion 3 out 5 MMT, elbow 3 out 5, first digit  ROM deficits at DIP and PIP s/p surg from gout flare up. RUE Coordination: decreased fine motor;decreased gross motor   Lower Extremity Assessment Lower Extremity Assessment: Defer to PT evaluation   Cervical / Trunk Assessment Cervical / Trunk Assessment: Other exceptions (hx back surg)   Communication Communication Communication: No difficulties   Cognition Arousal/Alertness: Awake/alert Behavior During Therapy: WFL for tasks assessed/performed Overall Cognitive Status: Within Functional Limits for tasks assessed                     General Comments       Exercises Exercises: Other exercises Other Exercises Other Exercises: Fine motor handout provided- coines / cup with lid provided to allow patient to complete digit ROM exercises and fine motor task of placing coins in container and picking them up off a table surface Other Exercises: Pt provided theraputty ( yellow) with handout and asked to return demo. pt needed encouragement to use R UE and L UE only for support. pt is L handed so task required cues Other Exercises: provided theraband level 1 with  education on shoulder flexion, abduction elbow flexion / extension in supine Other Exercises: wife present for all education and return demonstration. wife expressed appreciation for all the education.  Other Exercises: pt and wife encouraged to seek outpatient therapy if patient has deficits at next follow up appointment. pt with recent R UE 1 digit surgery and educated on stabilization of each joint for AROM , isolating each joints movment and tendon gliding exercises.    Shoulder Instructions      Home Living Family/patient expects to be discharged to:: Private residence Living Arrangements: Spouse/significant other;Children Available Help at Discharge: Family;Available PRN/intermittently Type of Home: House Home Access: Stairs to enter [employer] of Steps: 4 Entrance Stairs-Rails: Right;Left Home Layout:  Two level;Able to live on main level with bedroom/bathroom Alternate Level Stairs-Number of Steps: flight Alternate Level Stairs-Rails: Right;Left Bathroom Shower/Tub: Walk-in shower;Curtain   Bathroom Toilet: Handicapped height     Home Equipment: Clinical cytogeneticist - 2 wheels;Bedside commode;Hand held shower head;Crutches;Cane - single point          Prior Functioning/Environment Level of Independence: Independent             OT Diagnosis: Generalized weakness   OT Problem List:     OT Treatment/Interventions:      OT Goals(Current goals can be found in the care plan section)    OT Frequency:     Barriers to D/C:            Co-evaluation              End of Session    Activity Tolerance: Patient tolerated treatment well Patient left: in bed;with call bell/phone within reach;with family/visitor present   Time: [id number] OT Time Calculation (min): 32 min Charges:  OT General Charges $OT Visit: 1 Procedure OT Evaluation $OT Eval Moderate Complexity: 1 Procedure OT Treatments $Therapeutic Exercise: 8-22 mins G-Codes:    Dennis [name] 02-09-2016, 2:37 PM   Dennis [name]   OTR/L [phone] Office: [phone] .

## 2016-01-26 NOTE — Care Management Important Message (Signed)
Important Message  Patient Details  Name: Dennis Turrentine Piercey Sr. MRN: [id number] Date of Birth: [birth year]   Medicare Important Message Given:  Yes    Loann Quill 01/26/2016, 8:00 AM

## 2016-01-26 NOTE — Progress Notes (Signed)
STROKE TEAM PROGRESS NOTE   HISTORY OF PRESENT ILLNESS (per record) Nolon Nations Leeds Sr. is a 70 y.o. male recently admitted with multifocal infarcts in the right MCA distribution. Recommendation including TEE and Loop recorder were made. Given timing, and this was thought to be able to be accomplished as an outpatient. He also complains of some blurred vision as well. He was LKW at 11 AM on 01/25/2016. Patient was not administered IV t-PA secondary to recent stroke. He was admitted for further evaluation and treatment.   SUBJECTIVE (INTERVAL HISTORY) His wife is at the bedside.  She is very upset - she did not want pt discharged home on Saturday. Now pt with another stroke. She states she was told last night that he would have a TEE today. No TEE seen on schedule today. Will contact them.    Patient reports hx of palpitations 2-3 times per week associated with dizziness, pale, cyanosis. Per pt and wife, [name] aware.    OBJECTIVE Temp:  [97.5 F (36.4 C)-98.5 F (36.9 C)] 97.9 F (36.6 C) (07/18 1017) Pulse Rate:  [52-79] 54 (07/18 1017) Cardiac Rhythm:  [-] Sinus bradycardia (07/18 0730) Resp:  [12-18] 18 (07/18 1017) BP: (105-143)/(55-99) 116/73 mmHg (07/18 1017) SpO2:  [98 %-100 %] 100 % (07/18 1017) Weight:  [162.2 kg (357 lb 9.4 oz)] 162.2 kg (357 lb 9.4 oz) (07/17 2035)  CBC:  Recent Labs Lab 01/21/16 1529  01/25/16 1418 01/25/16 1423  WBC 5.6  --  6.2  --   NEUTROABS 3.7  --  4.2  --   HGB 14.4  < > 15.8 16.3  HCT 42.7  < > 47.8 48.0  MCV 86.4  --  87.2  --   PLT 155  --  181  --   < > = values in this interval not displayed.  Basic Metabolic Panel:   Recent Labs Lab 01/25/16 1418 01/25/16 1423 01/26/16 0958  NA 134* 138 137  K 4.1 4.2 4.8  CL 103 101 103  CO2 23  --  29  GLUCOSE 101* 101* 124*  BUN 25* 31* 22*  CREATININE 1.60* 1.60* 1.45*  CALCIUM 10.0  --  9.7    Lipid Panel:     Component Value Date/Time   CHOL 220* 01/22/2016 0556   TRIG  180* 01/22/2016 0556   HDL 35* 01/22/2016 0556   CHOLHDL 6.3 01/22/2016 0556   VLDL 36 01/22/2016 0556   LDLCALC 149* 01/22/2016 0556   HgbA1c:  Lab Results  Component Value Date   HGBA1C 5.8* 01/22/2016   Urine Drug Screen:     Component Value Date/Time   LABOPIA NONE DETECTED 01/25/2016 1806   COCAINSCRNUR NONE DETECTED 01/25/2016 1806   LABBENZ NONE DETECTED 01/25/2016 1806   AMPHETMU NONE DETECTED 01/25/2016 1806   THCU NONE DETECTED 01/25/2016 1806   LABBARB NONE DETECTED 01/25/2016 1806      IMAGING I have personally reviewed the radiological images below and agree with the radiology interpretations.  Mri and Mra Brain Wo Contrast 01/21/2016 IMPRESSION: MRI HEAD IMPRESSION: 1. Patchy small volume multi focal acute ischemic left MCA territory infarcts as above. No associated hemorrhage or mass effect. 2. Few additional tiny acute ischemic nonhemorrhagic right MCA territory infarcts as above. 3. Remote lacunar infarct within the left thalamus. 4. Mild for age cerebral atrophy with chronic small vessel ischemic disease. MRA HEAD IMPRESSION: 1. No large or proximal arterial branch occlusion within the intracranial circulation. 2. Short-segment mild to moderate stenosis  at the proximal left M1 segment. No other high-grade or correctable stenosis identified. 3. Additional atheromatous irregularity involving the left MCA and bilateral PCAs as detailed above.   MRI HEAD  01/25/2016 Multiple left hemispheric acute/ subacute infarcts. Since the 01/21/2016 examination, the infarct involving the left caudate has increased in size, new infarct anterior lateral margin of the left lenticular nucleus and new small infarct left motor strip. Remainder of infarcts appear relatively similar. Remainder of findings without change.   MRA HEAD 01/25/2016  No significant narrowing internal carotid arteries to level of the carotid terminus. Mild to moderate focal narrowing proximal M1 segment left middle  cerebral artery. Moderate to marked narrowing and irregularity middle cerebral artery branches bilaterally. Left vertebral artery is dominant. Mild to moderate tandem stenosis right vertebral artery. Nonvisualized right posterior inferior cerebellar artery. Moderate narrowing proximal left posterior inferior cerebellar artery. Mild narrowing proximal and mid aspect of the basilar artery. Nonvisualized left anterior inferior cerebellar artery. Moderate narrowing right anterior inferior cerebellar artery. Moderate narrowing superior cerebellar artery bilaterally. Mild to moderate narrowing P2 segment posterior cerebral artery bilaterally. Moderate distal posterior cerebral artery branch vessel narrowing and irregularity bilaterally.   Ct Head Code Stroke W/o Cm 01/25/2016  No acute intracranial findings. Stable age related cerebral atrophy, ventriculomegaly and periventricular white matter disease.   TTE 01/22/16 - - Left ventricle: The cavity size was normal. Wall thickness was  normal. Systolic function was normal. The estimated ejection  fraction was in the range of 55% to 60%. Wall motion was normal;  there were no regional wall motion abnormalities. Features are  consistent with a pseudonormal left ventricular filling pattern,  with concomitant abnormal relaxation and increased filling  pressure (grade 2 diastolic dysfunction).  CUS - No significant extracranial carotid artery stenosis demonstrated in bilateral carotid arteries. Vertebral are patent with antegrade flow.   PHYSICAL EXAM  Temp:  [97.5 F (36.4 C)-98.5 F (36.9 C)] 98 F (36.7 C) (07/18 1345) Pulse Rate:  [52-79] 56 (07/18 1345) Resp:  [13-18] 18 (07/18 1345) BP: (105-143)/(61-99) 120/67 mmHg (07/18 1345) SpO2:  [98 %-100 %] 100 % (07/18 1345) Weight:  [357 lb 9.4 oz (162.2 kg)] 357 lb 9.4 oz (162.2 kg) (07/17 2035)  General - Well nourished, well developed, in no apparent distress.  Ophthalmologic - Fundi not  visualized.  Cardiovascular - Regular rhythm but bradycardia.  Mental Status -  Level of arousal and orientation to time, place, and person were intact. Language including expression, naming, repetition, comprehension was assessed and found intact. Fund of Knowledge was assessed and was intact.  Cranial Nerves II - XII - II - Visual field intact OU. III, IV, VI - Extraocular movements intact. V - Facial sensation intact bilaterally. VII - Facial movement intact bilaterally. VIII - Hearing & vestibular intact bilaterally. X - Palate elevates symmetrically. XI - Chin turning & shoulder shrug intact bilaterally. XII - Tongue protrusion intact.  Motor Strength - The patient's strength was normal in all extremities except right hand mild dexterity difficulty and pronator drift was absent.  Bulk was normal and fasciculations were absent.   Motor Tone - Muscle tone was assessed at the neck and appendages and was normal.  Reflexes - The patient's reflexes were 1+ in all extremities and he had no pathological reflexes.  Sensory - Light touch, temperature/pinprick were assessed and were decreased on the right, 60% of left.    Coordination - The patient had normal movements in the hands with no ataxia or dysmetria,  mildly slow on the right hand.  Tremor was absent.  Gait and Station - deferred.   ASSESSMENT/PLAN Mr. Coltin Haslem Deshazer Sr. is a 70 y.o. male with history of multiple TIAs, CHF, HTN, CAD, hypothyroidism, testicular and prostate cancer, gout, OSA, arthritis, and recurrent UTIs  presenting with increasing R hemiparesis  He did not receive IV t-PA due to recent stroke.   Stroke:  Left caudate infarct with increased in size, new infarct left BG and new small infarct left motor strip. Remainder of infarcts appear relatively similar to last admission. Rencent infarcts emboli with unclear etiology although suspicious for afib. The new infarcts more likely due to hypotension in the setting  with multiple BP meds  Resultant  R hand weakness worse than last week  MRI  Multiple L acute/subacute infarcts. In the past 4 days, L caudate evolved. New L lenticular nucleus and new small L motor strip infarct. Other infarcts unchanged  MRA  Mild to mod L M1 stensos. Diffused atherosclerosis  LDL 149  HgbA1c 5.8  SCDs for VTE prophylaxis TEE to look for embolic source. Arranged with [place] for tomorrow.  If positive for PFO (patent foramen ovale), check bilateral lower extremity venous dopplers to rule out DVT as possible source of stroke. (I have made patient NPO after midnight tonight).   If TEE negative, a Industry electrophysiologist will consult and consider placement of an implantable loop recorder to evaluate for atrial fibrillation as etiology of stroke. This has been explained to patient/family by Dr. Erlinda Hong and they are agreeable.  Diet Heart Room service appropriate?: Yes; Fluid consistency:: Thin  aspirin 325 mg daily prior to admission, now on aspirin 325 mg daily.   Ongoing aggressive stroke risk factor management  Therapy recommendations:  OP PT  Disposition:  Anticipate return home  Hypotension Hx Hypertension  On coreg, lasix, losartan and aldactone at home  111/55, 105/65 on admission  BP medications on hold due to low BP on admission  Permissive hypertension (OK if <220/120) for 24-48 hours post stroke and then gradually normalized within 5-7 days.  Long-term BP goal normotensive  Palpitations   Wife and family reports episode of heart racing 3-4 per week  Associated with dizziness, pale, cyanosis  Concerning for pAfib  Will do TEE and loop  Hx stroke/TIA  01/2016 Patchy L MCA and few tiny R MCA territory infarcts  2004 R brainstem without residual deficits  Hyperlipidemia  Home meds:  lipitor 80, resumed in hospital  LDL 149, goal < 70  Continue statin at discharge  Other Stroke Risk  Factors  Advanced age  ETOH use, advised to drink no more than 2 drink(s) a day  Morbid Obesity, Body mass index is 40.27 kg/(m^2)., recommend weight loss, diet and exercise as appropriate   Family hx stroke (mother)  Coronary artery disease - MI  OSA, on CPAP at home  Diastolic CHF  Other Active Problems  AKI, Ct 1.6  Chronic UTI  Gout   Hypothyroidism   Depression  Chronic back pain  Hospital day # 1  Rosalin Hawking, MD PhD Stroke Neurology 01/26/2016 3:18 PM     To contact Stroke Continuity provider, please refer to [email or web address] After hours, contact General Neurology

## 2016-01-27 ENCOUNTER — Encounter (HOSPITAL_COMMUNITY): Payer: Self-pay

## 2016-01-27 ENCOUNTER — Inpatient Hospital Stay (HOSPITAL_COMMUNITY): Payer: Medicare Other

## 2016-01-27 ENCOUNTER — Encounter (HOSPITAL_COMMUNITY)
Admission: EM | Disposition: A | Discharge status: Home / Self Care | Payer: Self-pay | Source: Home / Self Care | Attending: Family Medicine

## 2016-01-27 ENCOUNTER — Ambulatory Visit (HOSPITAL_COMMUNITY): Admit: 2016-01-27 | Payer: Self-pay | Admitting: Internal Medicine

## 2016-01-27 DIAGNOSIS — I639 Cerebral infarction, unspecified: Secondary | ICD-10-CM

## 2016-01-27 HISTORY — PX: EP IMPLANTABLE DEVICE: SHX172B

## 2016-01-27 HISTORY — PX: TEE WITHOUT CARDIOVERSION: SHX5443

## 2016-01-27 LAB — BASIC METABOLIC PANEL
Anion gap: 7 (ref 5–15)
BUN: 21 mg/dL — ABNORMAL HIGH (ref 6–20)
CO2: 29 mmol/L (ref 22–32)
Calcium: 9.8 mg/dL (ref 8.9–10.3)
Chloride: 99 mmol/L — ABNORMAL LOW (ref 101–111)
Creatinine, Ser: 1.42 mg/dL — ABNORMAL HIGH (ref 0.61–1.24)
GFR calc Af Amer: 56 mL/min — ABNORMAL LOW (ref >60)
GFR calc non Af Amer: 49 mL/min — ABNORMAL LOW (ref >60)
Glucose, Bld: 112 mg/dL — ABNORMAL HIGH (ref 65–99)
Potassium: 4.9 mmol/L (ref 3.5–5.1)
Sodium: 135 mmol/L (ref 135–145)

## 2016-01-27 SURGERY — LOOP RECORDER INSERTION

## 2016-01-27 SURGERY — ECHOCARDIOGRAM, TRANSESOPHAGEAL
Anesthesia: Moderate Sedation

## 2016-01-27 MED ORDER — FENTANYL CITRATE (PF) 100 MCG/2ML IJ SOLN
INTRAMUSCULAR | Status: DC | PRN
  Administered 2016-01-27: 50 ug via INTRAVENOUS

## 2016-01-27 MED ORDER — BUTAMBEN-TETRACAINE-BENZOCAINE 2-2-14 % EX AERO
INHALATION_SPRAY | CUTANEOUS | Status: DC | PRN
Start: 2016-01-27 — End: 2016-01-27
  Administered 2016-01-27: 2 via TOPICAL

## 2016-01-27 MED ORDER — FENTANYL CITRATE (PF) 100 MCG/2ML IJ SOLN
INTRAMUSCULAR | Status: AC
  Filled 2016-01-27: qty 2

## 2016-01-27 MED ORDER — LIDOCAINE-EPINEPHRINE 1 %-1:100000 IJ SOLN
INTRAMUSCULAR | Status: AC
  Filled 2016-01-27: qty 1

## 2016-01-27 MED ORDER — ONDANSETRON HCL 4 MG/2ML IJ SOLN: 4.0000 mg | Freq: Four times a day (QID) | INTRAMUSCULAR | Status: DC | PRN

## 2016-01-27 MED ORDER — MIDAZOLAM HCL 10 MG/2ML IJ SOLN
INTRAMUSCULAR | Status: DC | PRN
  Administered 2016-01-27 (×2): 2 mg via INTRAVENOUS

## 2016-01-27 MED ORDER — LIDOCAINE-EPINEPHRINE 1 %-1:100000 IJ SOLN
INTRAMUSCULAR | Status: DC | PRN
  Administered 2016-01-27: 20 mL

## 2016-01-27 MED ORDER — MIDAZOLAM HCL 5 MG/ML IJ SOLN
INTRAMUSCULAR | Status: AC
Start: 2016-01-27 — End: 2016-01-27
  Filled 2016-01-27: qty 2

## 2016-01-27 MED ORDER — ACETAMINOPHEN 325 MG PO TABS: 325.0000 mg | ORAL_TABLET | ORAL | Status: DC | PRN

## 2016-01-27 SURGICAL SUPPLY — 2 items
LOOP REVEAL LINQSYS (Prosthesis & Implant Heart) ×2 IMPLANT
PACK LOOP INSERTION (CUSTOM PROCEDURE TRAY) ×2 IMPLANT

## 2016-01-27 NOTE — Op Note (Signed)
INDICATIONS: stroke  PROCEDURE:   Informed consent was obtained prior to the procedure. The risks, benefits and alternatives for the procedure were discussed and the patient comprehended these risks.  Risks include, but are not limited to, cough, sore throat, vomiting, nausea, somnolence, esophageal and stomach trauma or perforation, bleeding, low blood pressure, aspiration, pneumonia, infection, trauma to the teeth and death.    After a procedural time-out, the oropharynx was anesthetized with 20% benzocaine spray.   During this procedure the patient was administered a total of Versed 4 mg and Fentanyl 50 mcg to achieve and maintain moderate conscious sedation.  The patient's heart rate, blood pressure, and oxygen saturationweare monitored continuously during the procedure. The period of conscious sedation was 15 minutes, of which I was present face-to-face 100% of this time.  The transesophageal probe was inserted in the esophagus and stomach without difficulty and multiple views were obtained.  The patient was kept under observation until the patient left the procedure room.  The patient left the procedure room in stable condition.   Agitated microbubble saline contrast was administered.  COMPLICATIONS:    There were no immediate complications.  FINDINGS:  No cardiac or aortic source of embolism  RECOMMENDATIONS:   Loop recorder implantation  Time Spent Directly with the Patient:  30 minutes   Davelyn Gwinn 01/27/2016, 3:21 PM

## 2016-01-27 NOTE — Progress Notes (Signed)
Physical Therapy Treatment Patient Details Name: Dennis Weidel Torbert Sr. MRN: [id number] DOB: 09-Aug-1945 Today's Date: 01/27/2016    History of Present Illness Patient is a 70 y/o male with recent admission for CVA 7/14, prostate and testicular CA, gout, recurrent UTIs, HTN, MI, CAD, CHF, [place] spotted fever, OSA on CPAP presents with worsening right sided weakness, blurry vision. MRI/MRA Head-increase in size of infarct noted on imaging during last admission (7/13), as well as two new smaller infarcts-left lenticular nucleus and new small infarct left motor strip     PT Comments    Patient progressing well towards PT goals. Improved ambulation distance and demonstrated more stable gait today. Continues to have some right knee instability most notable during stair training but no LOB noted. Plans to go down for TEE today. Will continue to follow for strengthening and higher level balance challenges.    Follow Up Recommendations  Supervision - Intermittent;Outpatient PT (pending improvement)     Equipment Recommendations  None recommended by PT    Recommendations for Other Services       Precautions / Restrictions Precautions Precautions: Fall Restrictions Weight Bearing Restrictions: No    Mobility  Bed Mobility Overal bed mobility: Modified Independent Bed Mobility: Supine to Sit     Supine to sit: Modified independent (Device/Increase time)     General bed mobility comments: increased time; use of rails.  Transfers Overall transfer level: Needs assistance Equipment used: None Transfers: Sit to/from Stand Sit to Stand: Supervision         General transfer comment: SLow to stand and supervision provided for safety due to weakness in RLE.   Ambulation/Gait Ambulation/Gait assistance: Supervision Ambulation Distance (Feet): 250 Feet Assistive device: None Gait Pattern/deviations: Step-through pattern;Decreased stride length   Gait velocity interpretation: Below  normal speed for age/gender General Gait Details: More steady gait today with no evidence of knee buckling.    Stairs Stairs: Yes   Stair Management: One rail Left;Alternating pattern;Step to pattern Number of Stairs: 2 (+ 3 steps x2 bouts) General stair comments: Attempted to ascend with RLE however right knee instability noted, cues to ascend with LLE and for safety.  Wheelchair Mobility    Modified Rankin (Stroke Patients Only) Modified Rankin (Stroke Patients Only) Pre-Morbid Rankin Score: No significant disability Modified Rankin: Moderately severe disability     Balance Overall balance assessment: Needs assistance Sitting-balance support: Feet supported;No upper extremity supported Sitting balance-Leahy Scale: Good     Standing balance support: During functional activity Standing balance-Leahy Scale: Fair Standing balance comment: Able to reach outside BoS across bed to reach phone without LOB and stand statically talking on phone without sway or balance difficulties.                     Cognition Arousal/Alertness: Awake/alert Behavior During Therapy: WFL for tasks assessed/performed Overall Cognitive Status: Within Functional Limits for tasks assessed                      Exercises      General Comments        Pertinent Vitals/Pain Pain Assessment: No/denies pain    Home Living                      Prior Function            PT Goals (current goals can now be found in the care plan section) Progress towards PT goals: Progressing toward goals  Frequency  Min 4X/week    PT Plan Current plan remains appropriate    Co-evaluation             End of Session Equipment Utilized During Treatment: Gait belt Activity Tolerance: Patient tolerated treatment well Patient left: in bed;with call bell/phone within reach     Time: 0841-0901 PT Time Calculation (min) (ACUTE ONLY): 20 min  Charges:  $Gait Training: 8-22  mins                    G Codes:      Jermine Bibbee A Truth Wolaver 01/27/2016, 9:20 AM  Wray Kearns, [place], DPT [phone]
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
## 2016-01-27 NOTE — Progress Notes (Signed)
Pt. Already wearing cpap.  

## 2016-01-27 NOTE — Telephone Encounter (Signed)
Pt in hospital       

## 2016-01-27 NOTE — Progress Notes (Signed)
STROKE TEAM PROGRESS NOTE   SUBJECTIVE (INTERVAL HISTORY) His wife and son and friend are at the bedside.  Pt complains of left sided frontal HA likely due to missing meals (NPO for TEE). Given percocet by RN. Pending TEE and potential loop.  OBJECTIVE Temp:  [97.5 F (36.4 C)-97.9 F (36.6 C)] 97.6 F (36.4 C) (07/19 1526) Pulse Rate:  [55-69] 63 (07/19 1550) Cardiac Rhythm:  [-] Normal sinus rhythm;Sinus bradycardia (07/19 0800) Resp:  [8-25] 25 (07/19 1550) BP: (108-150)/(52-103) 142/90 mmHg (07/19 1550) SpO2:  [95 %-100 %] 100 % (07/19 1550) Weight:  [361 lb 5.3 oz (163.9 kg)] 361 lb 5.3 oz (163.9 kg) (07/19 0500)  CBC:  Recent Labs Lab 01/21/16 1529  01/25/16 1418 01/25/16 1423  WBC 5.6  --  6.2  --   NEUTROABS 3.7  --  4.2  --   HGB 14.4  < > 15.8 16.3  HCT 42.7  < > 47.8 48.0  MCV 86.4  --  87.2  --   PLT 155  --  181  --   < > = values in this interval not displayed.  Basic Metabolic Panel:   Recent Labs Lab 01/26/16 0958 01/27/16 0351  NA 137 135  K 4.8 4.9  CL 103 99*  CO2 29 29  GLUCOSE 124* 112*  BUN 22* 21*  CREATININE 1.45* 1.42*  CALCIUM 9.7 9.8    Lipid Panel:     Component Value Date/Time   CHOL 220* 01/22/2016 0556   TRIG 180* 01/22/2016 0556   HDL 35* 01/22/2016 0556   CHOLHDL 6.3 01/22/2016 0556   VLDL 36 01/22/2016 0556   LDLCALC 149* 01/22/2016 0556   HgbA1c:  Lab Results  Component Value Date   HGBA1C 5.8* 01/22/2016   Urine Drug Screen:     Component Value Date/Time   LABOPIA NONE DETECTED 01/25/2016 1806   COCAINSCRNUR NONE DETECTED 01/25/2016 1806   LABBENZ NONE DETECTED 01/25/2016 1806   AMPHETMU NONE DETECTED 01/25/2016 1806   THCU NONE DETECTED 01/25/2016 1806   LABBARB NONE DETECTED 01/25/2016 1806      IMAGING I have personally reviewed the radiological images below and agree with the radiology interpretations.  Mri and Mra Brain Wo Contrast 01/21/2016 IMPRESSION: MRI HEAD IMPRESSION: 1. Patchy small volume  multi focal acute ischemic left MCA territory infarcts as above. No associated hemorrhage or mass effect. 2. Few additional tiny acute ischemic nonhemorrhagic right MCA territory infarcts as above. 3. Remote lacunar infarct within the left thalamus. 4. Mild for age cerebral atrophy with chronic small vessel ischemic disease. MRA HEAD IMPRESSION: 1. No large or proximal arterial branch occlusion within the intracranial circulation. 2. Short-segment mild to moderate stenosis at the proximal left M1 segment. No other high-grade or correctable stenosis identified. 3. Additional atheromatous irregularity involving the left MCA and bilateral PCAs as detailed above.   MRI HEAD  01/25/2016 Multiple left hemispheric acute/ subacute infarcts. Since the 01/21/2016 examination, the infarct involving the left caudate has increased in size, new infarct anterior lateral margin of the left lenticular nucleus and new small infarct left motor strip. Remainder of infarcts appear relatively similar. Remainder of findings without change.   MRA HEAD 01/25/2016  No significant narrowing internal carotid arteries to level of the carotid terminus. Mild to moderate focal narrowing proximal M1 segment left middle cerebral artery. Moderate to marked narrowing and irregularity middle cerebral artery branches bilaterally. Left vertebral artery is dominant. Mild to moderate tandem stenosis right vertebral artery. Nonvisualized right  posterior inferior cerebellar artery. Moderate narrowing proximal left posterior inferior cerebellar artery. Mild narrowing proximal and mid aspect of the basilar artery. Nonvisualized left anterior inferior cerebellar artery. Moderate narrowing right anterior inferior cerebellar artery. Moderate narrowing superior cerebellar artery bilaterally. Mild to moderate narrowing P2 segment posterior cerebral artery bilaterally. Moderate distal posterior cerebral artery branch vessel narrowing and irregularity  bilaterally.   Ct Head Code Stroke W/o Cm 01/25/2016  No acute intracranial findings. Stable age related cerebral atrophy, ventriculomegaly and periventricular white matter disease.   TTE 01/22/16 - - Left ventricle: The cavity size was normal. Wall thickness was  normal. Systolic function was normal. The estimated ejection  fraction was in the range of 55% to 60%. Wall motion was normal;  there were no regional wall motion abnormalities. Features are  consistent with a pseudonormal left ventricular filling pattern,  with concomitant abnormal relaxation and increased filling  pressure (grade 2 diastolic dysfunction).  CUS - No significant extracranial carotid artery stenosis demonstrated in bilateral carotid arteries. Vertebral are patent with antegrade flow.  TEE - No cardiac or aortic source of embolism   PHYSICAL EXAM  Temp:  [97.5 F (36.4 C)-97.9 F (36.6 C)] 97.6 F (36.4 C) (07/19 1526) Pulse Rate:  [55-69] 63 (07/19 1550) Resp:  [8-25] 25 (07/19 1550) BP: (108-150)/(52-103) 142/90 mmHg (07/19 1550) SpO2:  [95 %-100 %] 100 % (07/19 1550) Weight:  [361 lb 5.3 oz (163.9 kg)] 361 lb 5.3 oz (163.9 kg) (07/19 0500)  General - Well nourished, well developed, in no apparent distress.  Ophthalmologic - Fundi not visualized.  Cardiovascular - Regular rhythm but bradycardia.  Mental Status -  Level of arousal and orientation to time, place, and person were intact. Language including expression, naming, repetition, comprehension was assessed and found intact. Fund of Knowledge was assessed and was intact.  Cranial Nerves II - XII - II - Visual field intact OU. III, IV, VI - Extraocular movements intact. V - Facial sensation intact bilaterally. VII - Facial movement intact bilaterally. VIII - Hearing & vestibular intact bilaterally. X - Palate elevates symmetrically. XI - Chin turning & shoulder shrug intact bilaterally. XII - Tongue protrusion intact.  Motor  Strength - The patient's strength was normal in all extremities except right hand mild dexterity difficulty and pronator drift was absent.  Bulk was normal and fasciculations were absent.   Motor Tone - Muscle tone was assessed at the neck and appendages and was normal.  Reflexes - The patient's reflexes were 1+ in all extremities and he had no pathological reflexes.  Sensory - Light touch, temperature/pinprick were assessed and were decreased on the right, 60% of left.    Coordination - The patient had normal movements in the hands with no ataxia or dysmetria, mildly slow on the right hand.  Tremor was absent.  Gait and Station - deferred.   ASSESSMENT/PLAN Mr. Dennis Brenneke Colley Sr. is a 70 y.o. male with history of multiple TIAs, CHF, HTN, CAD, hypothyroidism, testicular and prostate cancer, gout, OSA, arthritis, and recurrent UTIs  presenting with increasing R hemiparesis  He did not receive IV t-PA due to recent stroke.   Stroke:  Left caudate infarct with increased in size, new infarct left BG and new small infarct left motor strip. Remainder of infarcts appear relatively similar to last admission. Rencent infarcts emboli with unclear etiology although suspicious for afib. The current new infarcts more likely due to hypotension in the setting with multiple BP meds  Resultant  R hand  weakness worse than last week  MRI  Multiple L acute/subacute infarcts. In the past 4 days, L caudate evolved. New L lenticular nucleus and new small L motor strip infarct. Other infarcts unchanged  MRA  Mild to mod L M1 stensos. Diffused atherosclerosis  LDL 149  HgbA1c 5.8  SCDs for VTE prophylaxis TEE unremarkable Loop recorder placed.  Diet Heart Room service appropriate?: Yes; Fluid consistency:: Thin  aspirin 325 mg daily prior to admission, now on aspirin 325 mg daily. Continue ASA on discharge.  Ongoing aggressive stroke risk factor management  Therapy recommendations:  OP  PT  Disposition:  Anticipate return home  Hypotension Hx Hypertension  On coreg, lasix, losartan and aldactone at home  111/55, 105/65 on admission  BP medications on hold due to low BP on admission  Gradually normalized within 5-7 days.  Long-term BP goal normotensive  Palpitations   Wife and family reports episode of heart racing 3-4 per week  Associated with dizziness, pale, cyanosis  Concerning for pAfib  Loop placed  Hx stroke/TIA  01/2016 Patchy L MCA and few tiny R MCA territory infarcts  2004 R brainstem without residual deficits  Hyperlipidemia  Home meds:  lipitor 80, resumed in hospital  LDL 149, goal < 70  Continue statin at discharge  Other Stroke Risk Factors  Advanced age  ETOH use, advised to drink no more than 2 drink(s) a day  Morbid Obesity, Body mass index is 40.69 kg/(m^2)., recommend weight loss, diet and exercise as appropriate   Family hx stroke (mother)  Coronary artery disease - MI  OSA, on CPAP at home  Diastolic CHF  Other Active Problems  AKI, Ct 1.6  Chronic UTI  Gout   Hypothyroidism   Depression  Chronic back pain  Hospital day # 2   Neurology will sign off. Please call with questions. Pt will follow up with Dr. Leonie Man at [facility] in about 2 months. Thanks for the consult.   Rosalin Hawking, MD PhD Stroke Neurology 01/27/2016 5:35 PM     To contact Stroke Continuity provider, please refer to [email or web address] After hours, contact General Neurology
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
## 2016-01-27 NOTE — Progress Notes (Signed)
Echocardiogram Echocardiogram Transesophageal has been performed.  Tresa Res 01/27/2016, 4:25 PM

## 2016-01-28 ENCOUNTER — Encounter (HOSPITAL_COMMUNITY): Payer: Self-pay | Admitting: Internal Medicine

## 2016-01-28 LAB — BASIC METABOLIC PANEL
Anion gap: 3 — ABNORMAL LOW (ref 5–15)
BUN: 21 mg/dL — ABNORMAL HIGH (ref 6–20)
CO2: 30 mmol/L (ref 22–32)
Calcium: 9.1 mg/dL (ref 8.9–10.3)
Chloride: 102 mmol/L (ref 101–111)
Creatinine, Ser: 1.23 mg/dL (ref 0.61–1.24)
GFR calc Af Amer: >60 mL/min (ref >60)
GFR calc non Af Amer: 58 mL/min — ABNORMAL LOW (ref >60)
Glucose, Bld: 107 mg/dL — ABNORMAL HIGH (ref 65–99)
Potassium: 4.5 mmol/L (ref 3.5–5.1)
Sodium: 135 mmol/L (ref 135–145)

## 2016-01-28 LAB — CORTISOL-AM, BLOOD: Cortisol - AM: 4.1 ug/dL — ABNORMAL LOW (ref 6.7–22.6)

## 2016-01-28 MED ORDER — CARVEDILOL 3.125 MG PO TABS: 3.1250 mg | ORAL_TABLET | Freq: Two times a day (BID) | ORAL | Status: DC
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
## 2016-01-28 NOTE — Discharge Instructions (Signed)
You were admitted to the hospital for a stroke likely related to drops in your blood pressure, rather than any embolic source.  We are still working you up for the possibility of atrial fibrillation as a last test for a possible embolic source of your stroke (loop recorder).  In order to avoid having any drops in your blood pressure we decided to decrease your dose of carvedilol to 3.125mg  two times daily.  At this time we are all in agreement that a blood thinner is not appropriate and we also agree that talking to [name] (your Cardiologist) about all of these changes is also a great idea. We wish you the best.

## 2016-01-28 NOTE — Progress Notes (Signed)
Physical Therapy Treatment Patient Details Name: Dennis Clinkscales Coddington Sr. MRN: [id number] DOB: 06/12/1946 Today's Date: 01/28/2016    History of Present Illness Patient is a 70 y/o male with recent admission for CVA 7/14, prostate and testicular CA, gout, recurrent UTIs, HTN, MI, CAD, CHF, [place] spotted fever, OSA on CPAP presents with worsening right sided weakness, blurry vision. MRI/MRA Head-increase in size of infarct noted on imaging during last admission (7/13), as well as two new smaller infarcts-left lenticular nucleus and new small infarct left motor strip. s/p loop implanter.    PT Comments    Patient progressing well towards PT goals. Tolerated higher level balance challenges during mobility today without overt LOB and only mild deviations in gait. Pt slightly impulsive during mobility. Scored 21/24 on the DGI. Pt eager to return home. No instability noted during gait training but uneasy during stair negotiation. Anticipate mobility and strength will improve with increased activity. BP EOB 132/82, BP post ambulation 154/82. Will follow.   Follow Up Recommendations  Supervision - Intermittent     Equipment Recommendations  None recommended by PT    Recommendations for Other Services       Precautions / Restrictions Precautions Precautions: Fall Restrictions Weight Bearing Restrictions: No    Mobility  Bed Mobility Overal bed mobility: Modified Independent Bed Mobility: Supine to Sit     Supine to sit: Modified independent (Device/Increase time);HOB elevated     General bed mobility comments: increased time; use of rails.  Transfers Overall transfer level: Needs assistance Equipment used: None Transfers: Sit to/from Stand Sit to Stand: Supervision         General transfer comment: SLow to stand and supervision provided for safety due to weakness in RLE. No dizziness.  Ambulation/Gait Ambulation/Gait assistance: Supervision Ambulation Distance (Feet): 500  Feet Assistive device: None Gait Pattern/deviations: Step-through pattern;Decreased stride length;Drifts right/left   Gait velocity interpretation: Below normal speed for age/gender General Gait Details: Mostly steady gait, tolerated higher level balance challenges today with no overt LOB, mild gait deviations. DOE. Hr stable.   Stairs Stairs: Yes Stairs assistance: Min guard Stair Management: Two rails;Alternating pattern Number of Stairs: 3 General stair comments: Despite cues for step to pattern, pt demonstrating alternating pattern. No LOB.  Wheelchair Mobility    Modified Rankin (Stroke Patients Only) Modified Rankin (Stroke Patients Only) Pre-Morbid Rankin Score: No significant disability Modified Rankin: Moderately severe disability     Balance Overall balance assessment: Needs assistance Sitting-balance support: Feet supported;No upper extremity supported Sitting balance-Leahy Scale: Good     Standing balance support: During functional activity Standing balance-Leahy Scale: Fair               High level balance activites: Head turns;Sudden stops;Direction changes;Turns High Level Balance Comments: Tolerated above with only mild deviations in gait pattern but no LOB.     Cognition Arousal/Alertness: Awake/alert Behavior During Therapy: WFL for tasks assessed/performed;Impulsive Overall Cognitive Status: Within Functional Limits for tasks assessed                      Exercises      General Comments        Pertinent Vitals/Pain Pain Assessment: No/denies pain    Home Living                      Prior Function            PT Goals (current goals can now be found in the care  plan section) Progress towards PT goals: Progressing toward goals    Frequency  Min 4X/week    PT Plan Current plan remains appropriate    Co-evaluation             End of Session Equipment Utilized During Treatment: Gait belt Activity  Tolerance: Patient tolerated treatment well Patient left: in bed;with call bell/phone within reach;with nursing/sitter in room     Time: [id number] PT Time Calculation (min) (ACUTE ONLY): 23 min  Charges:  $Gait Training: 8-22 mins $Neuromuscular Re-education: 8-22 mins                    G Codes:      Sandie Swayze A Bernabe Dorce 01/28/2016, 10:15 AM Wray Kearns, Barber, DPT [phone]

## 2016-01-28 NOTE — Progress Notes (Signed)
Right after PT had patient walking in hallway

## 2016-01-29 NOTE — Telephone Encounter (Signed)
Thanks

## 2016-01-30 NOTE — Discharge Summary (Signed)
[facility] Discharge Summary  Patient name: Dennis [name]. Medical record number: [id number] Date of birth: March 28, 1946 Age: 70 y.o. Gender: male Date of Admission: 01/25/2016  Date of Discharge: 01/28/16  Admitting Physician: Alveda Reasons, MD  Primary Care Provider: Wilhemena Durie, MD Consultants: Cardiology, Neurology  Indication for Hospitalization: Stroke  Discharge Diagnoses/Problem List:  Patient Active Problem List   Diagnosis Date Noted  . AKI (acute kidney injury) ([place])   . Stroke (cerebrum) ([place])   . HLD (hyperlipidemia)   . Essential hypertension   . Hypotension due to drugs   . Palpitations   . Stroke ([place]) 01/25/2016  . Acute CVA (cerebrovascular accident) ([place]) 01/22/2016  . Right sided weakness 01/21/2016  . TIA (transient ischemic attack) 01/21/2016  . Acquired hypothyroidism 11/04/2015  . Arthritis 11/04/2015  . Congestive heart failure ([place]) 11/04/2015  . Decreased libido 11/04/2015  . Narrowing of intervertebral disc space 11/04/2015  . Major depressive disorder, single episode, mild ([place]) 11/04/2015  . Essential (primary) hypertension 11/04/2015  . H/o Lyme disease 11/04/2015  . Hypercholesterolemia 11/04/2015  . Hypotestosteronism 11/04/2015  . Morbid obesity ([place]) 11/04/2015  . Allergic state 11/04/2015  . Temporary cerebral vascular dysfunction 11/04/2015  . Chronic tophaceous gout 09/21/2015  . Rectal bleed 08/14/2015  . Rectal bleeding 08/14/2015  . Blood glucose elevated 06/02/2015  . Benign prostatic hyperplasia with urinary obstruction 09/26/2014  . Epididymitis, left 08/04/2014  . OSA on CPAP 08/03/2014  . Diastolic CHF ([place]) 99991111  . Epididymitis 08/03/2014  . Abnormal TSH 08/03/2014  . Cellulitis of scrotum   . Cellulitis, scrotum 08/02/2014  . Hypertension 08/02/2014  . CAD (coronary artery disease) 08/02/2014  . Bursitis, trochanteric 05/15/2014  . Bleeding per rectum 12/03/2013  . S/P  lumbar spinal fusion 07/17/2013  . Chronic back pain      Disposition: Home  Discharge Condition: Stable, improved  Discharge Exam:  General: obese male sitting up in bed in NAD Eyes: PERRLA, EOMI ENTM: slightly dry mucous membranes; no oropharyngeal erythema or exudate Neck: supple, no cervical lymphadenopaty Cardiovascular: RRR, no murmurs appreciated, distal pulses intact Respiratory: CTAB, normal WOB on RA Abdomen: soft, non-tender, non-distended, +BS MSK: 4/5 strength R upper and lower extremities with 4/5 grip strength; 5/5 strength L upper and lower extremities; 1+ pitting edema to midshin bilaterally Skin: no rashes or bruises noted Neuro: R-sided facial droop; sensation to light touch decrease on RLE; A&Ox3 Psych: appropriate mood and affect  Brief Hospital Course:   Right sided weakness 2/2 CVA:  Patient was discharged three days prior to admission after a separate admission for CVA.  Prior admission showed L MCA stroke that expanded between admissions.  Patient presented with right sided weakness and blurred vision in his right eye.  TPA was not given in setting of his recent stroke. Patient was treated with permissive hypertension and blood pressure medications were held.  Recent echo on 01/22/16 showed EF: 55-60% with grade 2 diastolic dysfunction.  Carotid dopplers show mild plaques in bilateral internal carotid arteries, without evidence of stenosis.  Patient had questionable afib during both admissions, however while admitted we were unable to see any abnormalities on telemetry or EKG.  Patient had a normal TEE showing no source of emboli.  Next, he had a loop recorder placed, which he will wear for 30 days and will follow up with electrophysiology.  Was seen by PT who recommend outpatient therapy and OT recommended no therapy.  Decided against anticoagulating the patient  for now and will follow up with loop recorder results.  Was advised to stop ARB for now and to continue  carvedilol, but to cut dose down to 3.125mg  BID and to use his diuretics when symptomatic.  He is planning to follow up with stroke clinic in 2 months and to follow up with electrophysiology in one month and his regular Cardiologist as needed.   Hypertension:  Blood pressure medications were held during admission due to being treated with permissive hypertension.  He was a bit low at times, SBP in the 100s and improved with IVF.  His two cryptogenic strokes are thought to be due to hypotension according to Neurology.  We had no identifiable source of embolism and so this is currently the leading differential.  Therefore, we decided to decrease his carvedilol to 3.125mg  BID and recommended that he hold off on his ARB for now and to only use his diuretic when he is symptomatic.   Issues for Follow Up:  1. Stroke: Patient has had two cryptogenic strokes and has required hospitalization twice within the last month.  Unclear etiology, felt to be related to hypotension, but still being worked up for possible embolic cause with loop recorder.  Will follow up regarding those results and will also follow up with his Cardiologist regarding blood pressure medication changes. Also had a loop recorder placed and will follow up with those results after study is done.  Changed his blood pressure medication to carvedilol 3.125mg  BID, stopped the ARB and recommended only using diuretic when symptomatic.   2. Blood pressure:  Unclear whether patient has been taking his medications as prescribed.  Were held during admission and his pressures were <150/90.  Recommend following up with his Cardiologist regarding our changes to his medication.  His carvedilol was decreased to 3.125mg  BID, due to his strokes possibly being due to hypotension.   Significant Procedures:  1. Transesophageal echo 2. Loop recorder placement  Significant Labs and Imaging:   Recent Labs Lab 01/25/16 1418 01/25/16 1423  WBC 6.2  --   HGB 15.8  16.3  HCT 47.8 48.0  PLT 181  --     Recent Labs Lab 01/25/16 1418 01/25/16 1423 01/26/16 0958 01/27/16 0351 01/28/16 0604  NA 134* 138 137 135 135  K 4.1 4.2 4.8 4.9 4.5  CL 103 101 103 99* 102  CO2 23  --  29 29 30   GLUCOSE 101* 101* 124* 112* 107*  BUN 25* 31* 22* 21* 21*  CREATININE 1.60* 1.60* 1.45* 1.42* 1.23  CALCIUM 10.0  --  9.7 9.8 9.1  ALKPHOS 57  --   --   --   --   AST 17  --   --   --   --   ALT 17  --   --   --   --   ALBUMIN 4.4  --   --   --   --     Results/Tests Pending at Time of Discharge: None  Discharge Medications:    Medication List    STOP taking these medications        losartan 100 MG tablet  Commonly known as:  COZAAR     spironolactone 100 MG tablet  Commonly known as:  ALDACTONE      TAKE these medications        allopurinol 100 MG tablet  Commonly known as:  ZYLOPRIM  Take 5 tablets daily     aspirin 325 MG tablet  Take  1 tablet (325 mg total) by mouth daily.     atorvastatin 80 MG tablet  Commonly known as:  LIPITOR  Take 1 tablet (80 mg total) by mouth daily.     carvedilol 3.125 MG tablet  Commonly known as:  COREG  Take 1 tablet (3.125 mg total) by mouth 2 (two) times daily with a meal.     ciprofloxacin 500 MG tablet  Commonly known as:  CIPRO  Take 500 mg by mouth daily with breakfast.     colchicine 0.6 MG tablet  Take 1 tablet (0.6 mg total) by mouth 2 (two) times daily.     EPINEPHrine 0.3 mg/0.3 mL Soaj injection  Commonly known as:  EPI-PEN  Inject 0.3 mLs (0.3 mg total) into the muscle as needed (allergic reaction).     escitalopram 10 MG tablet  Commonly known as:  LEXAPRO  Take 1 tablet (10 mg total) by mouth daily.     furosemide 20 MG tablet  Commonly known as:  LASIX  Take 1 tablet (20 mg total) by mouth daily.     indomethacin 50 MG capsule  Commonly known as:  INDOCIN  Take 1 capsule (50 mg total) by mouth 2 (two) times daily with a meal.     levothyroxine 200 MCG tablet  Commonly  known as:  SYNTHROID, LEVOTHROID  Take 1 tablet (200 mcg total) by mouth daily before breakfast.     oxyCODONE-acetaminophen 5-325 MG tablet  Commonly known as:  PERCOCET/ROXICET  Take 1 tablet by mouth every 4 (four) hours as needed for severe pain.     polyethylene glycol packet  Commonly known as:  MIRALAX / GLYCOLAX  Take 17 g by mouth daily as needed for mild constipation or moderate constipation.     potassium chloride SA 20 MEQ tablet  Commonly known as:  K-DUR,KLOR-CON  Take 1 tablet (20 mEq total) by mouth daily.        Discharge Instructions: Please refer to Patient Instructions section of EMR for full details.  Patient was counseled important signs and symptoms that should prompt return to medical care, changes in medications, dietary instructions, activity restrictions, and follow up appointments.   Follow-Up Appointments:     Follow-up Information    Follow up with SETHI,PRAMOD, MD In 2 months.   Specialties:  Neurology, Radiology   Why:  Stroke Clinic, Office will call you with appointment date & time   Contact information:   [place] [id number] [phone]       Follow up with [facility] Office On 02/08/2016.   Specialty:  Cardiology   Why:  12:00 (noon)   Contact information:   [address] [place]      Eloise Levels, MD 01/30/2016, 10:58 PM PGY-1, [place]

## 2016-02-04 ENCOUNTER — Telehealth: Payer: Self-pay | Admitting: Internal Medicine

## 2016-02-04 NOTE — Telephone Encounter (Signed)
Spoke w/ pt and informed him that we was receiving his manual transmission. Pt wanted to know if his home monitor was suppose to be automatic. I informed him that his monitor is automatic and sends transmissions between 12 AM - 5 AM. Pt stated that he has watched it b/w 3-4 AM and the monitor never does anything. He asked if he should just continue to send manual transmission. I told him no b/c for every manual transmission he sends that takes time off his battery life. I told pt that I am not sure if we would see the automatic transmission and his manual transmission. I instructed pt to call tech services as they may be a better fit for his questions. Pt verbalized understanding.

## 2016-02-04 NOTE — Telephone Encounter (Signed)
New Message  Pt calling to speak w/ Device- stated that he has been manuallysending remote transmissions everyday since surgery- pt thinks it should transmit automatically- has wnd chk scheduled for 7/31. Please call back and discuss.

## 2016-02-08 ENCOUNTER — Ambulatory Visit (INDEPENDENT_AMBULATORY_CARE_PROVIDER_SITE_OTHER): Payer: Medicare Other | Admitting: *Deleted

## 2016-02-08 ENCOUNTER — Encounter: Payer: Self-pay | Admitting: Internal Medicine

## 2016-02-08 VITALS — BP 129/81

## 2016-02-08 DIAGNOSIS — I639 Cerebral infarction, unspecified: Secondary | ICD-10-CM

## 2016-02-08 LAB — CUP PACEART INCLINIC DEVICE CHECK: Date Time Interrogation Session: 20170731134224

## 2016-02-08 NOTE — Patient Instructions (Addendum)
Schedule an appointment with [name] for your 1 year follow-up.

## 2016-02-08 NOTE — Progress Notes (Signed)
Wound check appointment. Steri-strips previously removed by patient. Wound without redness or edema. Incision edges approximated, wound well healed. Normal device function. Battery status: good. R-waves 0.54mV. No symptom, tachy, brady, or AF episodes. 2 pause episodes--false, undersensing. Reprogrammed pause and brady detection off (no hx of syncope). Monthly summary reports and ROV with GT PRN.

## 2016-02-11 ENCOUNTER — Encounter: Payer: Self-pay | Admitting: Cardiovascular Disease

## 2016-02-11 ENCOUNTER — Ambulatory Visit (INDEPENDENT_AMBULATORY_CARE_PROVIDER_SITE_OTHER): Payer: Medicare Other | Admitting: Cardiovascular Disease

## 2016-02-11 VITALS — BP 154/100 | HR 78 | Ht 79.0 in | Wt 368.6 lb

## 2016-02-11 DIAGNOSIS — I10 Essential (primary) hypertension: Secondary | ICD-10-CM | POA: Diagnosis not present

## 2016-02-11 DIAGNOSIS — I2510 Atherosclerotic heart disease of native coronary artery without angina pectoris: Secondary | ICD-10-CM

## 2016-02-11 DIAGNOSIS — I5032 Chronic diastolic (congestive) heart failure: Secondary | ICD-10-CM

## 2016-02-11 DIAGNOSIS — I639 Cerebral infarction, unspecified: Secondary | ICD-10-CM

## 2016-02-11 MED ORDER — LOSARTAN POTASSIUM 50 MG PO TABS: 50.0000 mg | ORAL_TABLET | Freq: Every day | ORAL | 11 refills | Status: DC
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
## 2016-02-11 NOTE — Patient Instructions (Signed)
Medication Instructions:  Your physician has recommended you make the following change in your medication: Start Cozaar 50 mg by mouth daily.    Labwork: none  Testing/Procedures: none  Follow-Up: Your physician wants you to follow-up in: 12 months.  You will receive a reminder letter in the mail two months in advance. If you don't receive a letter, please call our office to schedule the follow-up appointment.   Any Other Special Instructions Will Be Listed Below (If Applicable).     If you need a refill on your cardiac medications before your next appointment, please call your pharmacy.

## 2016-02-12 ENCOUNTER — Other Ambulatory Visit: Payer: Self-pay

## 2016-02-12 NOTE — Patient Outreach (Signed)
[facility]) Care Management  02/12/2016  Dennis Nations Dimaio Sr. Dec 05, 1945 [id number]   REFERRAL SOURCE: EMMI stroke transition program REFERRAL REASON: EMMI stroke RED alert CONSENT: self/ patient   PROVIDERS:  Dr. Wilhemena Durie - primary MD Dr. Julianne Handler - Cardiology Dr. Leonie Man - neurology  SOCIAL: support system - spouse Patient drives    SUBJECTIVE: Telephone call to patient regarding EMMI referral.  HIPAA verified with patient.  Discussed EMMI stroke transition program with patient. Patient verbally agreed to telephone calls with [facility]. Patient states he has not followed up with his primary MD. Patient states he follows up more closely with his specialist.  Patient states he had a follow up appointment with Dr. Julianne Handler, cardiologist on yesterday 02/11/16.  Patient states his blood pressure medication dose was adjusted. Patient states he continues to take losartan.  Patient states he has a follow up appointment scheduled with the neurologist, Dr. Leonie Man.  RNCM confirmed patients appointment with Dr. Leonie Man for 04/18/16.  Patient denies having any residual from the stroke. Patient states the right sided weakness and blurred vision have resolved. Patient states he did not have outpatient physical therapy after discharge from the hospital. Patient states he has been seen for his loop recording check. Patient states incision site is healing well.  Patient states he is monitoring his blood pressure daily. States today's blood pressure is 142/80. Patient states he is following a low salt diet.  Patient states he knows the signs and symptoms of stroke. Patient states he has had approximately 4 strokes in the past.   RNCM advised patient to continue to take his medications as prescribed. RNCM advised patient to keep follow up appointments with doctor.  RNCM advised patient to schedule follow up appointment with primary MD.   [facility] discussed with patient signs and symptoms of stroke.   Advised to call 911 for stroke like symptoms.  RNCM advised patient to contact doctor for any unusual signs/ symptoms.   ASSESSMENT:  EMMI stroke transition program.  Patient has good understanding of medical conditions and medications. Needs encouragement to follow up with primary MD. Patient is self managing care.  PLAN;  RNCM will follow up with patient within 1 week. RNCM will send patient EMMI education video regarding stroke, low salt diet.  Quinn Plowman RN,BSN,CCM [facility] Telephonic  [phone]

## 2016-02-19 ENCOUNTER — Other Ambulatory Visit: Payer: Self-pay

## 2016-02-19 NOTE — Patient Outreach (Signed)
[facility]) Care Management  02/19/2016  Dennis Eales Folkes Sr. July 16, 1945 [id number]   Telephone call to patient regarding EMMI stroke  Program. Unable to reach patient.  HIPAA compliant voice message left with call back phone number.   PLAN: RNCM will attempt 2nd telephone call to patient within  1 week.   Quinn Plowman RN,BSN,CCM [facility] Telephonic  [phone]

## 2016-02-23 ENCOUNTER — Other Ambulatory Visit: Payer: Self-pay

## 2016-02-23 NOTE — Patient Outreach (Signed)
[facility]) Care Management  02/23/2016  Dennis [name] Hanaway Sr. 1945/10/08 [id number]   REFERRAL SOURCE: EMMI stroke transition program REFERRAL REASON: EMMI stroke RED alert CONSENT: self/ patient   PROVIDERS:  Dr. Wilhemena Durie - primary MD Dr. Julianne Handler - Cardiology Dr. Leonie Man - neurology  SOCIAL: support system - spouse Patient drives   Telephone call to patient regarding EMMI stroke transition program.  Unable to reach patient.  HIPAA compliant voice message left with call back phone number.   PLAN: RNCM will attempt 3rd telephone outreach to patient within 1 week.  Quinn Plowman RN,BSN,CCM [facility] Telephonic  [phone]

## 2016-02-26 ENCOUNTER — Other Ambulatory Visit: Payer: Self-pay

## 2016-02-26 ENCOUNTER — Ambulatory Visit (INDEPENDENT_AMBULATORY_CARE_PROVIDER_SITE_OTHER): Payer: Medicare Other | Admitting: *Deleted

## 2016-02-26 DIAGNOSIS — I639 Cerebral infarction, unspecified: Secondary | ICD-10-CM | POA: Diagnosis not present

## 2016-02-26 NOTE — Patient Outreach (Signed)
[place] [facility]) Care Management  02/26/2016  Dennis Nations Copen Sr. [birth year] [id number]  REFERRAL SOURCE: EMMI stroke transition program REFERRAL REASON: EMMI stroke RED alert CONSENT: self/ patient   PROVIDERS:  Dr. Wilhemena Durie - primary MD Dr. Julianne Handler - Cardiology Dr. Leonie Man - neurology  SOCIAL: support system - spouse Patient drives   SUBJECTIVE ; Telephone call to patient regarding EMMI heart failure follow up.  HIPAA verified with patient. Patient states he is doing ok. Patient reports he  Has had some swelling in his lower legs for the past few days. Patient states he is not sure why.  Patient states he has had swelling  In his legs over the past few years but states he has not had any over the past 2 years. Patient denies any pain but states he has some itching in his legs.  Patient states he continues to take his lasix and uses the bathroom a lot. Patient states when he was taking the  Losartan it seemed to keep the swelling down in his legs.  Patient states the losartan was discontinued by the doctors in the hospital.  Patient states he may take a losartan to see if it helps.  RNCM advised patient to contact his doctor to discuss the recent onset of swelling before taking any additional medication.   Patient states he has a follow up appointment with his primary MD in 3 weeks. RNCM advised patient to notify his doctor of the swelling. Patient states he had bladder/ prostate surgery in November 2016.  Patient states he uses the bathroom more now due to that reason.  Patient states he received and reviewed the EMMI education video.  Patient denies having any questions.  RNCM advised patient to continue to adhere to a no salt diet. RNCM advised patient to continue to take his medications as prescribed.  RNCM reviewed signs/symptoms of stroke with patient.  Advised patient to call 911 for stroke like symptoms.   Patient verbalized understanding. Patient agreed to  next telephone outreach with [facility].   ASSESSMENT;  EMMI stroke program.  Patient continues to self management care. Patient encouraged to follow up with primary MD due to new onset of symptom.    PLAN:  RNCM will follow up with patient within  2 weeks.  Quinn Plowman RN,BSN,CCM [facility] Telephonic  [phone]

## 2016-02-27 ENCOUNTER — Other Ambulatory Visit: Payer: Self-pay | Admitting: Family Medicine

## 2016-03-01 NOTE — Progress Notes (Signed)
Carelink Summary Report / Loop Recorder 

## 2016-03-02 ENCOUNTER — Ambulatory Visit (INDEPENDENT_AMBULATORY_CARE_PROVIDER_SITE_OTHER): Payer: Medicare Other | Admitting: Family Medicine

## 2016-03-02 ENCOUNTER — Encounter: Payer: Self-pay | Admitting: Family Medicine

## 2016-03-02 VITALS — BP 142/90 | HR 64 | Temp 97.8°F | Resp 18 | Wt 382.0 lb

## 2016-03-02 DIAGNOSIS — R7989 Other specified abnormal findings of blood chemistry: Secondary | ICD-10-CM

## 2016-03-02 DIAGNOSIS — M7989 Other specified soft tissue disorders: Secondary | ICD-10-CM

## 2016-03-02 DIAGNOSIS — E2740 Unspecified adrenocortical insufficiency: Secondary | ICD-10-CM

## 2016-03-02 DIAGNOSIS — I639 Cerebral infarction, unspecified: Secondary | ICD-10-CM

## 2016-03-02 MED ORDER — CARVEDILOL 3.125 MG PO TABS: 3.1250 mg | ORAL_TABLET | Freq: Two times a day (BID) | ORAL | 0 refills | Status: DC

## 2016-03-02 MED ORDER — CEPHALEXIN 500 MG PO CAPS: 500.0000 mg | ORAL_CAPSULE | Freq: Two times a day (BID) | ORAL | 0 refills | Status: DC

## 2016-03-02 MED ORDER — FUROSEMIDE 40 MG PO TABS: 40.0000 mg | ORAL_TABLET | Freq: Every day | ORAL | 12 refills | Status: DC

## 2016-03-02 NOTE — Progress Notes (Signed)
Patient: Dennis Gell Fitzmaurice Sr. Male    DOB: Nov 12, 1945   70 y.o.   MRN: [id number] Visit Date: 03/02/2016  Today's Provider: Wilhemena Durie, MD   Chief Complaint  Patient presents with  . Hospitalization Follow-up  . Cerebrovascular Accident   Subjective:    HPI Patient comes in today for a hospital follow up. Patient was seen in the ER on 01/21/2016 due to right sided weakness. He reports that he was discharged the following day, but was admitted again on 01/25/2016 for an acute CVA. Patient reports that he has had several changes in his medications.        Allergies  Allergen Reactions  . Bee Venom Anaphylaxis  . Shrimp [Shellfish Allergy] Anaphylaxis    "just shrimp"  . Stadol [Butorphanol] Anaphylaxis    respiratory  Distress, couldn't breathe, cardiac arrest  . Wasp Venom Anaphylaxis  . Allopurinol Other (See Comments)    500 mg daily causes lethargy, dizziness; tolerates 200 mg daily  . Ciprofloxacin Diarrhea   Current Meds  Medication Sig  . allopurinol (ZYLOPRIM) 100 MG tablet Take 100 mg by mouth 2 (two) times daily.  Marland Kitchen aspirin 325 MG tablet Take 1 tablet (325 mg total) by mouth daily.  Marland Kitchen atorvastatin (LIPITOR) 80 MG tablet Take 1 tablet (80 mg total) by mouth daily.  . carvedilol (COREG) 3.125 MG tablet Take 1 tablet (3.125 mg total) by mouth 2 (two) times daily with a meal.  . ciprofloxacin (CIPRO) 500 MG tablet Take 500 mg by mouth daily with breakfast.  . colchicine 0.6 MG tablet Take 1 tablet (0.6 mg total) by mouth 2 (two) times daily.  Marland Kitchen EPINEPHrine 0.3 mg/0.3 mL IJ SOAJ injection Inject 0.3 mLs (0.3 mg total) into the muscle as needed (allergic reaction).  Marland Kitchen escitalopram (LEXAPRO) 10 MG tablet Take 1 tablet (10 mg total) by mouth daily.  . furosemide (LASIX) 20 MG tablet Take 1 tablet (20 mg total) by mouth daily.  . indomethacin (INDOCIN) 50 MG capsule Take 1 capsule (50 mg total) by mouth 2 (two) times daily with a meal.  . levothyroxine  (SYNTHROID, LEVOTHROID) 200 MCG tablet Take 1 tablet (200 mcg total) by mouth daily before breakfast.  . losartan (COZAAR) 50 MG tablet Take 1 tablet (50 mg total) by mouth daily.  Marland Kitchen oxyCODONE-acetaminophen (PERCOCET/ROXICET) 5-325 MG tablet Take 1 tablet by mouth every 4 (four) hours as needed for severe pain.  . polyethylene glycol (MIRALAX / GLYCOLAX) packet Take 17 g by mouth daily as needed for mild constipation or moderate constipation.  . potassium chloride SA (K-DUR,KLOR-CON) 20 MEQ tablet Take 1 tablet (20 mEq total) by mouth daily.    Review of Systems  Social History  Substance Use Topics  . Smoking status: Never Smoker  . Smokeless tobacco: Never Used  . Alcohol use Yes     Comment: " I drink wine about a year ago" 08/07/15   Objective:   BP (!) 142/90 (BP Location: Right Arm, Patient Position: Sitting, Cuff Size: Normal)   Pulse 64   Temp 97.8 F (36.6 C)   Resp 18   Wt (!) 382 lb (173.3 kg)   BMI 43.03 kg/m   Physical Exam  Constitutional: He is oriented to person, place, and time. He appears well-developed and well-nourished.  Cardiovascular: Normal rate, regular rhythm and normal heart sounds.   Pulmonary/Chest: Effort normal and breath sounds normal.  Musculoskeletal: He exhibits edema.  Neurological: He is alert and  oriented to person, place, and time.  Skin: Skin is warm and dry.        Assessment & Plan:     1. Cerebral infarction due to unspecified mechanism More than 25 minutes spent in this visit. More than 50% is spent in counseling patient and  2. Low serum cortisol level (HCC) - Ambulatory referral to Endocrinology  3. Swelling of both lower extremities Chronic  issue. Double Lasix for now. May  have to add metolazone back. Mild erythema of lower extremities. If patient develops worsening symptoms of cellulitis Keflex prescription is written and given to patient today. I think this is venous stasis changes and not cellulitis today. There is no  tenderness or induration. 4. Morbid obesity Major clinical problem for this patient.      Nollie Shiflett Cranford Mon, MD  [facility]

## 2016-03-03 DIAGNOSIS — M7062 Trochanteric bursitis, left hip: Secondary | ICD-10-CM | POA: Diagnosis not present

## 2016-03-07 ENCOUNTER — Other Ambulatory Visit: Payer: Self-pay

## 2016-03-08 ENCOUNTER — Other Ambulatory Visit: Payer: Self-pay

## 2016-03-08 NOTE — Patient Outreach (Signed)
[facility]) Care Management  03/08/2016  Brevin Durkee Dungee Sr. 1946-02-02 [id number]  REFERRAL SOURCE: EMMI stroke transition program REFERRAL REASON: EMMI stroke RED alert CONSENT: self/ patient   PROVIDERS:  Dr. Wilhemena Durie - primary MD Dr. Julianne Handler - Cardiology Dr. Leonie Man - neurology  SOCIAL: support system - spouse Patient drives   Telephone call to patient regarding EMMI stroke follow up. Unable to reach patient. HIPAA compliant voice message left with call back phone number.  PLAN:  RNCM will attempt 2nd telephone call to patient within 1 week.  Quinn Plowman RN,BSN,CCM [facility] Telephonic  [phone]

## 2016-03-10 ENCOUNTER — Other Ambulatory Visit: Payer: Self-pay

## 2016-03-10 NOTE — Patient Outreach (Signed)
[facility]) Care Management  03/10/2016  Dennis Samra Garside Sr. 23-Apr-1946 [id number]  REFERRAL SOURCE: EMMI stroke transition program REFERRAL REASON: EMMI stroke RED alert CONSENT: self/ patient   Second telephone call to patient regarding EMMI stroke program referral.  Unable to reach patient. HIPAA compliant voice message left with cal back phone number  PLAN; RNCM will attempt 3rd telephone outreach within 1 week.  Quinn Plowman RN,BSN,CCM [facility] Telephonic  [phone]

## 2016-03-11 ENCOUNTER — Other Ambulatory Visit: Payer: Self-pay

## 2016-03-11 NOTE — Patient Outreach (Signed)
[place] [facility]) Care Management  03/11/2016  Dennis Nations Tappan Sr. [birth year] [id number]   REFERRAL SOURCE: EMMI stroke transition program REFERRAL REASON: EMMI stroke RED alert CONSENT: self/ patient   PROVIDERS:  Dr. Wilhemena Durie - primary MD Dr. Julianne Handler - Cardiology Dr. Leonie Man - neurology  SOCIAL: support system - spouse Patient drives   SUBJECTIVE; Received return voice mail message from patient.  Called patient for EMMI stroke program follow up.  HIPAA verified with patient.  Patient states he had a follow up visit with his primary MD on 03/02/16.  Patient states he has a new situation regarding findings with his blood work. Patient states there is a suspicion that he may have Addison's disease. Patient states he has been referred to a endocrinologist with an appointment date for 03/27/16.   Patient states he doctor has restarted him on losartan. Patient reports his blood pressures are stable running from 144-147/ 80's.  Patient states the swelling in his legs has resolved.   Patient denies any concerns regarding his previous stroke.  Patient denies any new symptoms. Patient reports the strength has improved on his right side.  States he energy level has improved and the foggy memory he had has improved.  Patient states he has not had any incidents reported from his loop recorder. . Patient reports he has recently followed up with his cardiologist, Dr. Julianne Handler.  Patient states he does not have an appointment scheduled with the neurologist. RNCM reviewed patients discharged summary. Request on patients discharge summary is for patient to have a 2 month follow up with Dr. Leonie Man. RNCM contacted Dr. Clydene Fake office and spoke with [facility] who states patient is scheduled to see Dr. Leonie Man on April 18, 2016 at 1:00pm.  Check in time 12:30.   RNCM contacted patient back and notified him of his appointment with Dr. Leonie Man.  Gave date, time, address, contact phone number and  contact name, Sandie.  Patient verbalized appreciation for the follow up.  Patient states he continues to take his medications as prescribed.  RNCM advised patient to continue to keep follow up visits and report any new signs/symptoms to doctor as indicated.    ASSESSMENT:  Patient continues to self manage and verbalize improvement.  New potential diagnosis discussed.  Patient scheduled to see specialist for evaluation for new diagnosis.   PLAN: :RNCM will follow up with patient within  2 weeks.Quinn Plowman RN,BSN,CCM [facility] Telephonic  [phone]

## 2016-03-15 ENCOUNTER — Ambulatory Visit: Payer: Self-pay

## 2016-03-17 ENCOUNTER — Emergency Department (HOSPITAL_COMMUNITY): Payer: Medicare Other

## 2016-03-17 ENCOUNTER — Encounter (HOSPITAL_COMMUNITY): Payer: Self-pay | Admitting: Family Medicine

## 2016-03-17 ENCOUNTER — Ambulatory Visit (INDEPENDENT_AMBULATORY_CARE_PROVIDER_SITE_OTHER): Payer: Medicare Other | Admitting: Family Medicine

## 2016-03-17 ENCOUNTER — Observation Stay (HOSPITAL_COMMUNITY)
Admission: EM | Admit: 2016-03-17 | Discharge: 2016-03-19 | Disposition: A | Discharge status: Home / Self Care | Payer: Medicare Other | Attending: Internal Medicine | Admitting: Internal Medicine

## 2016-03-17 ENCOUNTER — Telehealth: Payer: Self-pay | Admitting: Cardiovascular Disease

## 2016-03-17 VITALS — BP 148/84 | HR 62 | Temp 98.0°F | Resp 18

## 2016-03-17 DIAGNOSIS — Z87440 Personal history of urinary (tract) infections: Secondary | ICD-10-CM | POA: Diagnosis not present

## 2016-03-17 DIAGNOSIS — Z8673 Personal history of transient ischemic attack (TIA), and cerebral infarction without residual deficits: Secondary | ICD-10-CM | POA: Insufficient documentation

## 2016-03-17 DIAGNOSIS — I639 Cerebral infarction, unspecified: Secondary | ICD-10-CM

## 2016-03-17 DIAGNOSIS — Z96652 Presence of left artificial knee joint: Secondary | ICD-10-CM | POA: Insufficient documentation

## 2016-03-17 DIAGNOSIS — E2740 Unspecified adrenocortical insufficiency: Secondary | ICD-10-CM | POA: Diagnosis not present

## 2016-03-17 DIAGNOSIS — I110 Hypertensive heart disease with heart failure: Secondary | ICD-10-CM | POA: Insufficient documentation

## 2016-03-17 DIAGNOSIS — Z8547 Personal history of malignant neoplasm of testis: Secondary | ICD-10-CM | POA: Insufficient documentation

## 2016-03-17 DIAGNOSIS — Z7982 Long term (current) use of aspirin: Secondary | ICD-10-CM | POA: Insufficient documentation

## 2016-03-17 DIAGNOSIS — I10 Essential (primary) hypertension: Secondary | ICD-10-CM | POA: Diagnosis present

## 2016-03-17 DIAGNOSIS — I878 Other specified disorders of veins: Secondary | ICD-10-CM | POA: Diagnosis not present

## 2016-03-17 DIAGNOSIS — M1A9XX1 Chronic gout, unspecified, with tophus (tophi): Secondary | ICD-10-CM | POA: Diagnosis not present

## 2016-03-17 DIAGNOSIS — Z79899 Other long term (current) drug therapy: Secondary | ICD-10-CM | POA: Diagnosis not present

## 2016-03-17 DIAGNOSIS — Z6841 Body Mass Index (BMI) 40.0 and over, adult: Secondary | ICD-10-CM | POA: Diagnosis not present

## 2016-03-17 DIAGNOSIS — G4730 Sleep apnea, unspecified: Secondary | ICD-10-CM | POA: Diagnosis present

## 2016-03-17 DIAGNOSIS — G4733 Obstructive sleep apnea (adult) (pediatric): Secondary | ICD-10-CM | POA: Diagnosis not present

## 2016-03-17 DIAGNOSIS — R42 Dizziness and giddiness: Secondary | ICD-10-CM

## 2016-03-17 DIAGNOSIS — R251 Tremor, unspecified: Secondary | ICD-10-CM | POA: Diagnosis not present

## 2016-03-17 DIAGNOSIS — E039 Hypothyroidism, unspecified: Secondary | ICD-10-CM | POA: Insufficient documentation

## 2016-03-17 DIAGNOSIS — I5032 Chronic diastolic (congestive) heart failure: Secondary | ICD-10-CM | POA: Diagnosis not present

## 2016-03-17 DIAGNOSIS — I252 Old myocardial infarction: Secondary | ICD-10-CM | POA: Insufficient documentation

## 2016-03-17 DIAGNOSIS — E785 Hyperlipidemia, unspecified: Secondary | ICD-10-CM | POA: Insufficient documentation

## 2016-03-17 DIAGNOSIS — F329 Major depressive disorder, single episode, unspecified: Secondary | ICD-10-CM | POA: Diagnosis not present

## 2016-03-17 DIAGNOSIS — R51 Headache: Secondary | ICD-10-CM | POA: Diagnosis not present

## 2016-03-17 DIAGNOSIS — Z792 Long term (current) use of antibiotics: Secondary | ICD-10-CM | POA: Diagnosis not present

## 2016-03-17 DIAGNOSIS — I2510 Atherosclerotic heart disease of native coronary artery without angina pectoris: Secondary | ICD-10-CM | POA: Diagnosis not present

## 2016-03-17 HISTORY — DX: Epididymitis: N45.1

## 2016-03-17 HISTORY — DX: Inflammatory disorders of scrotum: N49.2

## 2016-03-17 LAB — DIFFERENTIAL
Basophils Absolute: 0 10*3/uL (ref 0.0–0.1)
Basophils Relative: 0 %
Eosinophils Absolute: 0.2 10*3/uL (ref 0.0–0.7)
Eosinophils Relative: 3 %
Lymphocytes Relative: 22 %
Lymphs Abs: 1.4 10*3/uL (ref 0.7–4.0)
Monocytes Absolute: 0.3 10*3/uL (ref 0.1–1.0)
Monocytes Relative: 5 %
Neutro Abs: 4.5 10*3/uL (ref 1.7–7.7)
Neutrophils Relative %: 70 %

## 2016-03-17 LAB — URINALYSIS, ROUTINE W REFLEX MICROSCOPIC
Bilirubin Urine: NEGATIVE
Glucose, UA: NEGATIVE mg/dL
Hgb urine dipstick: NEGATIVE
Ketones, ur: NEGATIVE mg/dL
Leukocytes, UA: NEGATIVE
Nitrite: NEGATIVE
Protein, ur: NEGATIVE mg/dL
Specific Gravity, Urine: 1.018 (ref 1.005–1.030)
pH: 5.5 (ref 5.0–8.0)

## 2016-03-17 LAB — COMPREHENSIVE METABOLIC PANEL
ALT: 18 U/L (ref 17–63)
AST: 17 U/L (ref 15–41)
Albumin: 4.4 g/dL (ref 3.5–5.0)
Alkaline Phosphatase: 53 U/L (ref 38–126)
Anion gap: 8 (ref 5–15)
BUN: 19 mg/dL (ref 6–20)
CO2: 28 mmol/L (ref 22–32)
Calcium: 9.5 mg/dL (ref 8.9–10.3)
Chloride: 101 mmol/L (ref 101–111)
Creatinine, Ser: 1.19 mg/dL (ref 0.61–1.24)
GFR calc Af Amer: >60 mL/min (ref >60)
GFR calc non Af Amer: >60 mL/min (ref >60)
Glucose, Bld: 103 mg/dL — ABNORMAL HIGH (ref 65–99)
Potassium: 3.8 mmol/L (ref 3.5–5.1)
Sodium: 137 mmol/L (ref 135–145)
Total Bilirubin: 1.4 mg/dL — ABNORMAL HIGH (ref 0.3–1.2)
Total Protein: 7.4 g/dL (ref 6.5–8.1)

## 2016-03-17 LAB — I-STAT CHEM 8, ED
BUN: 22 mg/dL — ABNORMAL HIGH (ref 6–20)
Calcium, Ion: 1.19 mmol/L (ref 1.15–1.40)
Chloride: 100 mmol/L — ABNORMAL LOW (ref 101–111)
Creatinine, Ser: 1.2 mg/dL (ref 0.61–1.24)
Glucose, Bld: 100 mg/dL — ABNORMAL HIGH (ref 65–99)
HCT: 47 % (ref 39.0–52.0)
Hemoglobin: 16 g/dL (ref 13.0–17.0)
Potassium: 4 mmol/L (ref 3.5–5.1)
Sodium: 140 mmol/L (ref 135–145)
TCO2: 29 mmol/L (ref 0–100)

## 2016-03-17 LAB — CBC
HCT: 47 % (ref 39.0–52.0)
Hemoglobin: 15.6 g/dL (ref 13.0–17.0)
MCH: 28.6 pg (ref 26.0–34.0)
MCHC: 33.2 g/dL (ref 30.0–36.0)
MCV: 86.1 fL (ref 78.0–100.0)
Platelets: 186 10*3/uL (ref 150–400)
RBC: 5.46 MIL/uL (ref 4.22–5.81)
RDW: 13.2 % (ref 11.5–15.5)
WBC: 6.4 10*3/uL (ref 4.0–10.5)

## 2016-03-17 LAB — I-STAT TROPONIN, ED: Troponin i, poc: 0.01 ng/mL (ref 0.00–0.08)

## 2016-03-17 MED ORDER — MECLIZINE HCL 25 MG PO TABS
25.0000 mg | ORAL_TABLET | Freq: Once | ORAL | Status: AC
  Administered 2016-03-17: 25 mg via ORAL
  Filled 2016-03-17: qty 1

## 2016-03-17 NOTE — ED Notes (Signed)
Pt remains alert and oriented x's 3.  No neuro deficits noted at this time.
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
## 2016-03-17 NOTE — ED Notes (Signed)
Pt only able to stand beside bed and ambulate to other side of bed. Pt states he feels very "lightheaded and swimmy headed"

## 2016-03-17 NOTE — Telephone Encounter (Signed)
Agree. Thanks

## 2016-03-17 NOTE — ED Notes (Signed)
MD Wickline at the bedside

## 2016-03-17 NOTE — Telephone Encounter (Signed)
I spoke with pt. He reports vertigo like sensation for last 4-5 days. Feels queasy with this.  No chest pain, no shortness of breath.  States he was recently hospitalized for "blood clot".  Had dizziness at that time and also arm weakness.  He is not having any weakness at this time and none associated with recent vertigo like sensations.  Speech is clear. Describes episode last night while lying in bed where everything started spinning.  Had another episode this morning. These episodes are not like what he had when he went to the hospital the last time.  He is seeing primary care at 3:30 this afternoon.  I told pt if increase or change in symptoms prior to appt today he should go to ED.

## 2016-03-17 NOTE — Progress Notes (Signed)
Subjective:  HPI Pt came in today for dizziness, unsteadiness, focal weakness,shaking and feeling like he was "out of body". Pt reports that this has been going on for about 5 days and have gotten worse since last night. He looks very weak and does not look himself. It was getting worse as he sat in the exam room. I advised Dr. Rosanna Randy and sent pt to the ER. We wheeled him down stairs and helped him into his vehicle. Pt was agreeable with going to the ER.  Patient says the dizziness feels just like it did when he had a stroke 2 months ago. No associated weakness today. Prior to Admission medications   Medication Sig Start Date End Date Taking? Authorizing Provider  allopurinol (ZYLOPRIM) 100 MG tablet Take 100 mg by mouth 2 (two) times daily. 11/24/15   Historical Provider, MD  aspirin 325 MG tablet Take 1 tablet (325 mg total) by mouth daily. 01/23/16   Eloise Levels, MD  atorvastatin (LIPITOR) 80 MG tablet Take 1 tablet (80 mg total) by mouth daily. 01/23/16   Eloise Levels, MD  carvedilol (COREG) 3.125 MG tablet Take 1 tablet (3.125 mg total) by mouth 2 (two) times daily with a meal. 03/02/16   Jerrol Banana., MD  cephALEXin (KEFLEX) 500 MG capsule Take 1 capsule (500 mg total) by mouth 2 (two) times daily. 03/02/16   Richard Maceo Pro., MD  ciprofloxacin (CIPRO) 500 MG tablet Take 500 mg by mouth daily with breakfast.    Historical Provider, MD  colchicine 0.6 MG tablet Take 1 tablet (0.6 mg total) by mouth 2 (two) times daily. 12/15/15   Richard Maceo Pro., MD  EPINEPHrine 0.3 mg/0.3 mL IJ SOAJ injection Inject 0.3 mLs (0.3 mg total) into the muscle as needed (allergic reaction). 12/15/15   Richard Maceo Pro., MD  escitalopram (LEXAPRO) 10 MG tablet Take 1 tablet (10 mg total) by mouth daily. 12/15/15   Richard Maceo Pro., MD  furosemide (LASIX) 40 MG tablet Take 1 tablet (40 mg total) by mouth daily. 03/02/16   Richard Maceo Pro., MD  indomethacin (INDOCIN) 50 MG capsule  Take 1 capsule (50 mg total) by mouth 2 (two) times daily with a meal. 08/27/15   Richard Maceo Pro., MD  levothyroxine (SYNTHROID, LEVOTHROID) 200 MCG tablet Take 1 tablet (200 mcg total) by mouth daily before breakfast. 12/15/15   Jerrol Banana., MD  losartan (COZAAR) 50 MG tablet Take 1 tablet (50 mg total) by mouth daily. 02/11/16 05/11/16  Burnell Blanks, MD  oxyCODONE-acetaminophen (PERCOCET/ROXICET) 5-325 MG tablet Take 1 tablet by mouth every 4 (four) hours as needed for severe pain.    Historical Provider, MD  polyethylene glycol (MIRALAX / GLYCOLAX) packet Take 17 g by mouth daily as needed for mild constipation or moderate constipation. 08/15/15   Loletha Grayer, MD  potassium chloride SA (K-DUR,KLOR-CON) 20 MEQ tablet Take 1 tablet (20 mEq total) by mouth daily. 12/15/15   Richard Maceo Pro., MD    Patient Active Problem List   Diagnosis Date Noted  . AKI (acute kidney injury) ([place])   . Stroke (cerebrum) ([place])   . HLD (hyperlipidemia)   . Essential hypertension   . Hypotension due to drugs   . Palpitations   . Stroke ([place]) 01/25/2016  . Acute CVA (cerebrovascular accident) ([place]) 01/22/2016  . Right sided weakness 01/21/2016  . TIA (transient ischemic attack) 01/21/2016  . Acquired hypothyroidism 11/04/2015  .  Arthritis 11/04/2015  . Congestive heart failure ([place]) 11/04/2015  . Decreased libido 11/04/2015  . Narrowing of intervertebral disc space 11/04/2015  . Major depressive disorder, single episode, mild ([place]) 11/04/2015  . Essential (primary) hypertension 11/04/2015  . H/o Lyme disease 11/04/2015  . Hypercholesterolemia 11/04/2015  . Hypotestosteronism 11/04/2015  . Morbid obesity ([place]) 11/04/2015  . Allergic state 11/04/2015  . Temporary cerebral vascular dysfunction 11/04/2015  . Chronic tophaceous gout 09/21/2015  . Rectal bleed 08/14/2015  . Rectal bleeding 08/14/2015  . Blood glucose elevated 06/02/2015  . Benign prostatic hyperplasia with urinary  obstruction 09/26/2014  . Epididymitis, left 08/04/2014  . OSA on CPAP 08/03/2014  . Diastolic CHF ([place]) 99991111  . Epididymitis 08/03/2014  . Abnormal TSH 08/03/2014  . Cellulitis of scrotum   . Cellulitis, scrotum 08/02/2014  . Hypertension 08/02/2014  . CAD (coronary artery disease) 08/02/2014  . Bursitis, trochanteric 05/15/2014  . Bleeding per rectum 12/03/2013  . S/P lumbar spinal fusion 07/17/2013  . Chronic back pain     Past Medical History:  Diagnosis Date  . Arthritis    low back - DDD  . CHF (congestive heart failure) ([place])   . Chronic lower back pain    "from [place] 2007"  . Complication of anesthesia    Sometimes has N&V /w anesth.   . Coronary artery disease    Cath 2001  . Elevated PSA   . History of chronic bronchitis   . History of gout   . Hypertension   . Hypocholesteremia   . Hypothyroidism   . Kidney stone   . Myocardial infarction [facility]) 2001   2001- cardiac cath., cardiac clearanece note dr Otho Perl 05-14-13 on chart, stress test results 02-21-12 on chart  . OSA on CPAP    cpap setting of 10  . Pneumonia 2000's and 2013  . PONV (postoperative nausea and vomiting)   . [facility] spotted fever   . Stroke [facility]) 2004   "right brain stem; no residual "    Social History   Social History  . Marital status: Married    Spouse name: N/A  . Number of children: 1  . Years of education: N/A   Occupational History  . Retired-works at [employer] History Main Topics  . Smoking status: Never Smoker  . Smokeless tobacco: Never Used  . Alcohol use Yes     Comment: " I drink wine about a year ago" 08/07/15  . Drug use: No  . Sexual activity: No   Other Topics Concern  . Not on file   Social History Narrative  . No narrative on file    Allergies  Allergen Reactions  . Bee Venom Anaphylaxis  . Shrimp [Shellfish Allergy] Anaphylaxis    "just shrimp"  . Stadol [Butorphanol] Anaphylaxis    respiratory  Distress, couldn't breathe, cardiac  arrest  . Wasp Venom Anaphylaxis  . Allopurinol Other (See Comments)    500 mg daily causes lethargy, dizziness; tolerates 200 mg daily  . Ciprofloxacin Diarrhea    Review of Systems  Neurological: Positive for dizziness and focal weakness.    Immunization History  Administered Date(s) Administered  . Pneumococcal Conjugate-13 06/10/2014  . Pneumococcal-Unspecified 03/11/2010  . Tdap 10/01/2009   Objective:  BP (!) 148/84 (BP Location: Left Arm, Patient Position: Sitting, Cuff Size: Large)   Pulse 62   Temp 98 F (36.7 C) (Oral)   Resp 18   SpO2 100%   Physical Exam  Constitutional: He  is oriented to person, place, and time and well-developed, well-nourished, and in no distress.  Patient is in the chair and in a wheelchair when I have talked to him and examine him. He is able to help [name] move him from the wheelchair to a truck for transport to ED. He obviously does not feel well but is in no acute distress.  Cardiovascular: Normal rate and regular rhythm.   Pulmonary/Chest: Effort normal and breath sounds normal.  Neurological: He is alert and oriented to person, place, and time.  There is no focal gross weakness. Again, he was able to help [name] move from wheelchair to truck.  Skin: Skin is warm and dry.  Psychiatric: Mood and affect normal.    Lab Results  Component Value Date   WBC 6.2 01/25/2016   HGB 16.3 01/25/2016   HCT 48.0 01/25/2016   PLT 181 01/25/2016   GLUCOSE 107 (H) 01/28/2016   CHOL 220 (H) 01/22/2016   TRIG 180 (H) 01/22/2016   HDL 35 (L) 01/22/2016   LDLCALC 149 (H) 01/22/2016   TSH 11.772 (H) 01/21/2016   INR 1.17 01/25/2016   HGBA1C 5.8 (H) 01/22/2016    CMP     Component Value Date/Time   NA 135 01/28/2016 0604   NA 128 (A) 06/02/2015   NA 139 08/11/2012 1640   K 4.5 01/28/2016 0604   K 3.6 08/11/2012 1640   CL 102 01/28/2016 0604   CL 102 08/11/2012 1640   CO2 30 01/28/2016 0604   CO2 30 08/11/2012 1640   GLUCOSE 107 (H) 01/28/2016  0604   GLUCOSE 85 08/11/2012 1640   BUN 21 (H) 01/28/2016 0604   BUN 12 06/02/2015   BUN 14 08/11/2012 1640   CREATININE 1.23 01/28/2016 0604   CREATININE 0.98 08/11/2012 1640   CALCIUM 9.1 01/28/2016 0604   CALCIUM 9.2 08/11/2012 1640   PROT 7.9 01/25/2016 1418   PROT 8.6 (H) 08/11/2012 1640   ALBUMIN 4.4 01/25/2016 1418   ALBUMIN 4.5 08/11/2012 1640   AST 17 01/25/2016 1418   AST 20 08/11/2012 1640   ALT 17 01/25/2016 1418   ALT 25 08/11/2012 1640   ALKPHOS 57 01/25/2016 1418   ALKPHOS 91 08/11/2012 1640   BILITOT 1.8 (H) 01/25/2016 1418   BILITOT 1.3 (H) 08/11/2012 1640   GFRNONAA 58 (L) 01/28/2016 0604   GFRNONAA >60 08/11/2012 1640   GFRAA >60 01/28/2016 0604   GFRAA >60 08/11/2012 1640    Assessment and Plan :   1. Dizziness   2. Shaking   3. Cerebral infarction due to unspecified mechanism  I am concerned of the possibility of this being a recurrent problem of CVA for this patient despite a nonfocal exam. He feels the same as he did 2 months ago. Refer to ED for evaluation so imaging can be obtained if necessary. He says he wishes to go to [facility] of [facility] and his son which is to transport him by truck instead of going by ambulance. 4. Morbid obesity  HPI, Exam, and A&P Transcribed under the direction and in the presence of Richard L. Cranford Mon, MD  Electronically Signed: Webb Laws, CMA I have done the exam and reviewed the above chart and it is accurate to the best of my knowledge.  Miguel Aschoff MD [facility] 03/17/2016 3:48 PM

## 2016-03-17 NOTE — Progress Notes (Signed)
Pt transferred to unit from ED via NT x 1. Pt alert and oriented upon arrival. No complaints of pain or discomfort. No signs or symptoms of acute distress. Pt connected to telemetry and central monitoring notified. Pt oriented to unit as well as unit procedures. Pt now resting in bed at lowest position, bed alarm on, call light in reach. Will continue to monitor. Fortino Sic, RN, BSN 03/18/2016 12:14 AM

## 2016-03-17 NOTE — ED Triage Notes (Addendum)
Pt presents with c/o dizziness and blurred vision that began yesterday around 1945.  States recently hospitalized for "3 blood clots in my brain" and was d/c'd around 01/28/16 - states is not on any blood thinners.  No confusion, slurred speech, or weakness. PERRLA, face symmetric, grips symmetric, A&Ox4.

## 2016-03-17 NOTE — Telephone Encounter (Signed)
New Message  Pt voiced he's not having C.P. nor SOB.  Pt voiced he's having a vertigo affect and spinning like he's sea sick and goes away and feeling lightheaded a lot.   Pt voiced it feels like when he had a blood clot but no change in bowel movements.  Please follow up with pt. Thanks!

## 2016-03-18 ENCOUNTER — Encounter (HOSPITAL_COMMUNITY): Payer: Self-pay | Admitting: Internal Medicine

## 2016-03-18 ENCOUNTER — Observation Stay (HOSPITAL_COMMUNITY): Payer: Medicare Other

## 2016-03-18 DIAGNOSIS — I6340 Cerebral infarction due to embolism of unspecified cerebral artery: Secondary | ICD-10-CM | POA: Diagnosis not present

## 2016-03-18 DIAGNOSIS — I25119 Atherosclerotic heart disease of native coronary artery with unspecified angina pectoris: Secondary | ICD-10-CM

## 2016-03-18 DIAGNOSIS — G4733 Obstructive sleep apnea (adult) (pediatric): Secondary | ICD-10-CM

## 2016-03-18 DIAGNOSIS — R42 Dizziness and giddiness: Secondary | ICD-10-CM | POA: Diagnosis not present

## 2016-03-18 DIAGNOSIS — I10 Essential (primary) hypertension: Secondary | ICD-10-CM | POA: Diagnosis not present

## 2016-03-18 DIAGNOSIS — C6290 Malignant neoplasm of unspecified testis, unspecified whether descended or undescended: Secondary | ICD-10-CM | POA: Diagnosis not present

## 2016-03-18 DIAGNOSIS — E2740 Unspecified adrenocortical insufficiency: Secondary | ICD-10-CM

## 2016-03-18 LAB — AMMONIA: Ammonia: 15 umol/L (ref 9–35)

## 2016-03-18 MED ORDER — OXYCODONE-ACETAMINOPHEN 5-325 MG PO TABS: 1.0000 | ORAL_TABLET | Freq: Four times a day (QID) | ORAL | Status: DC | PRN

## 2016-03-18 MED ORDER — LOSARTAN POTASSIUM 50 MG PO TABS
100.0000 mg | ORAL_TABLET | Freq: Every day | ORAL | Status: DC
  Administered 2016-03-18 – 2016-03-19 (×2): 100 mg via ORAL
  Filled 2016-03-18 (×2): qty 2

## 2016-03-18 MED ORDER — ASPIRIN 325 MG PO TABS
325.0000 mg | ORAL_TABLET | Freq: Every day | ORAL | Status: DC
  Administered 2016-03-18: 325 mg via ORAL
  Filled 2016-03-18: qty 1

## 2016-03-18 MED ORDER — LEVOTHYROXINE SODIUM 100 MCG PO TABS
200.0000 ug | ORAL_TABLET | Freq: Every day | ORAL | Status: DC
  Administered 2016-03-18 – 2016-03-19 (×2): 200 ug via ORAL
  Filled 2016-03-18 (×2): qty 2

## 2016-03-18 MED ORDER — COLCHICINE 0.6 MG PO TABS
0.6000 mg | ORAL_TABLET | Freq: Two times a day (BID) | ORAL | Status: DC
  Administered 2016-03-18 – 2016-03-19 (×3): 0.6 mg via ORAL
  Filled 2016-03-18 (×3): qty 1

## 2016-03-18 MED ORDER — ENOXAPARIN SODIUM 40 MG/0.4ML ~~LOC~~ SOLN
40.0000 mg | SUBCUTANEOUS | Status: DC
  Administered 2016-03-18 – 2016-03-19 (×2): 40 mg via SUBCUTANEOUS
  Filled 2016-03-18 (×2): qty 0.4

## 2016-03-18 MED ORDER — ESCITALOPRAM OXALATE 10 MG PO TABS
10.0000 mg | ORAL_TABLET | Freq: Every day | ORAL | Status: DC
  Administered 2016-03-18 – 2016-03-19 (×2): 10 mg via ORAL
  Filled 2016-03-18 (×2): qty 1

## 2016-03-18 MED ORDER — ACETAMINOPHEN 650 MG RE SUPP: 650.0000 mg | RECTAL | Status: DC | PRN

## 2016-03-18 MED ORDER — FUROSEMIDE 40 MG PO TABS
40.0000 mg | ORAL_TABLET | Freq: Every day | ORAL | Status: DC
  Administered 2016-03-18 – 2016-03-19 (×2): 40 mg via ORAL
  Filled 2016-03-18 (×2): qty 1

## 2016-03-18 MED ORDER — POTASSIUM CHLORIDE CRYS ER 20 MEQ PO TBCR
20.0000 meq | EXTENDED_RELEASE_TABLET | Freq: Every day | ORAL | Status: DC
  Administered 2016-03-18 – 2016-03-19 (×2): 20 meq via ORAL
  Filled 2016-03-18 (×2): qty 1

## 2016-03-18 MED ORDER — ATORVASTATIN CALCIUM 80 MG PO TABS
80.0000 mg | ORAL_TABLET | Freq: Every day | ORAL | Status: DC
  Administered 2016-03-18 – 2016-03-19 (×2): 80 mg via ORAL
  Filled 2016-03-18 (×2): qty 1

## 2016-03-18 MED ORDER — COSYNTROPIN 0.25 MG IJ SOLR
0.2500 mg | Freq: Once | INTRAMUSCULAR | Status: AC
  Administered 2016-03-19: 0.25 mg via INTRAVENOUS
  Filled 2016-03-18: qty 0.25

## 2016-03-18 MED ORDER — ACETAMINOPHEN 325 MG PO TABS
650.0000 mg | ORAL_TABLET | ORAL | Status: DC | PRN
  Filled 2016-03-18: qty 2

## 2016-03-18 MED ORDER — ALLOPURINOL 100 MG PO TABS
100.0000 mg | ORAL_TABLET | Freq: Two times a day (BID) | ORAL | Status: DC
  Administered 2016-03-18 – 2016-03-19 (×3): 100 mg via ORAL
  Filled 2016-03-18 (×3): qty 1

## 2016-03-18 MED ORDER — CIPROFLOXACIN HCL 500 MG PO TABS
500.0000 mg | ORAL_TABLET | Freq: Every day | ORAL | Status: DC
  Administered 2016-03-18 – 2016-03-19 (×2): 500 mg via ORAL
  Filled 2016-03-18 (×2): qty 1

## 2016-03-18 MED ORDER — CARVEDILOL 3.125 MG PO TABS
3.1250 mg | ORAL_TABLET | Freq: Two times a day (BID) | ORAL | Status: DC
  Administered 2016-03-18 – 2016-03-19 (×3): 3.125 mg via ORAL
  Filled 2016-03-18 (×3): qty 1

## 2016-03-18 MED ORDER — CEPHALEXIN 500 MG PO CAPS
500.0000 mg | ORAL_CAPSULE | Freq: Two times a day (BID) | ORAL | Status: DC
  Administered 2016-03-18 (×2): 500 mg via ORAL
  Filled 2016-03-18 (×2): qty 1

## 2016-03-18 NOTE — Progress Notes (Signed)
  RD consulted for nutrition education regarding patient with morbid obesity.   Body mass index is 43.34 kg/m. Pt meets criteria for Morbid Obesity based on current BMI.  Pt reports that he was eating healthy PTA. He reports eating 5-6 small meals daily and he eats a variety of foods from all food groups. He reports following a heart healthy, low salt diet PTA and denies any education needs at this time. He states that "Despite what people assume, I do not live to eat". Encouraged pt to continue with heart healthy eating, especially fruits, vegetables, whole grains, and healthy fats such as nut and olive oil. Pt denies any questions or concerns at this time.   Current diet order is Regular, patient is consuming approximately 100% of meals at this time. Labs and medications reviewed. Pt declines nutrition interventions at this time. RD encouraged pt to have RN contact nutrition team if any questions or concerns arise.   Scarlette Ar RD, LDN, CSP Inpatient Clinical Dietitian Pager: [phone] After Hours Pager: [phone]

## 2016-03-18 NOTE — Progress Notes (Signed)
   03/18/16 1900  Pressure Ulcer Prevention  Repositioned Supine  Positioning Frequency Able to turn self  Hygiene  Hygiene Peri care  Level of Assistance Independent  Mobility  Activity Ambulate in hall;Ambulate in room;Bathroom privileges;Back to bed  Level of Assistance Independent  Assistive Device None  Distance Ambulated (ft) 200 ft  Bed Position Semi-fowlers  Range of Motion Active;All extremities    Patient ambulated after dinner with standby assist at this time. Denied any dz or other complaints.   Ave Filter, RN

## 2016-03-18 NOTE — Progress Notes (Addendum)
Progress Note    Dennis Flood Sr.  [id number] DOB: 16-Oct-2045  DOA: 03/17/2016 PCP: Wilhemena Durie, MD    Brief Narrative:   Chief complaint: Follow-up vertigo  Dennis Ellenbecker Bickley Sr. is an 70 y.o. male with a history of multiple prior strokes (admitted twice in July for left MCA CVA with expansion, status post TEE and loop recorder implantation), CAD (no regional wall motion abnormalities on TEE done 01/27/16, EF 55-60 percent), HTN, HLD, OSA managed with CPAP, testicular cancer, and thyroid disease who presented to the ED from his PCP's office for evaluation of vertigo on 03/17/16.   Assessment/Plan:   Principal Problem:   Vertigo with a history of acute on chronic left hemispheric cerebral infarcts Observed overnight. MRI of the head done 01/25/16 showed multiple left hemispheric acute/subacute infarcts within increase in size of the left caudate infarct and a new infarct in the anterior lateral margin of the left lenticular nucleus as well as left motor strip when compared to MRI done 01/21/16. Repeat MRI negative for acute stroke (personally reviewed). Currently being managed with aspirin, statin and risk factor modification. Continue meclizine for symptomatic relief of vertigo. Spoke with Dr. Leonel Ramsay of neurology who will see the patient in consultation. Will request cardiology interrogate loop recorder.  Active Problems:   CAD (coronary artery disease) Continue aspirin, statin, beta blocker.    OSA on CPAP Continue CPAP at at bedtime.    Morbid obesity [facility]) Dietitian consultation for diet counseling.    Essential hypertension Continue Coreg, Lasix, and Cozaar.    Gout Continue allopurinol and colchicine.    Hypothyroidism Continue Synthroid.    Depression Continue Lexapro.    Cellulitis versus venous stasis dermatitis Put on Keflex for venous stasis with concern for cellulitis. Discontinue Keflex.     Adrenal Insufficiency Cortisol noted to be low  last hospitalization.  Cosyntropin test ordered.    Chronic UTI On chronic treatment with Cipro for suppression.   Family Communication/Anticipated D/C date and plan/Code Status   DVT prophylaxis: Lovenox ordered. Code Status: Full Code.  Family Communication: Visitor at bedside. Disposition Plan: Home 03/19/16 after cosyntropin test.   Medical Consultants:    Neurology   Procedures:    None  Anti-Infectives:    Chronic Cipro  Subjective:   The patient reports that he is bit lightheaded but overall his dizziness has improved and that he can focus this morning. Review of symptoms is negative for blurry vision, nausea, new focal neurological deficits.  Objective:    Vitals:   03/18/16 0904 03/18/16 0930 03/18/16 1000 03/18/16 1406  BP: (!) 152/86 (!) 143/80  139/77  Pulse: 62 62  (!) 58  Resp: 20 (!) 22  20  Temp: 98 F (36.7 C) 98.4 F (36.9 C)  98 F (36.7 C)  TempSrc: Oral Oral  Oral  SpO2: 100% 100%  99%  Weight:   (!) 174.5 kg (384 lb 11.2 oz)   Height:   6\' 7"  (2.007 m)     Intake/Output Summary (Last 24 hours) at 03/18/16 1421 Last data filed at 03/18/16 0751  Gross per 24 hour  Intake                0 ml  Output              600 ml  Net             -600 ml   Filed Weights   03/18/16 1000  Weight: Marland Kitchen)  174.5 kg (384 lb 11.2 oz)    Exam: General exam: Appears calm and comfortable.  Respiratory system: Clear to auscultation, diminished. Respiratory effort normal. Cardiovascular system: S1 & S2 heard, RRR. No JVD,  rubs, gallops or clicks. No murmurs. Gastrointestinal system: Abdomen is nondistended, soft and nontender. No organomegaly or masses felt. Normal bowel sounds heard. Central nervous system: Alert and oriented. No focal neurological deficits. Extremities: No clubbing,  or cyanosis. 2-3+ edema. Skin: Venous stasis dermatitis bilateral lower extremities with anterior tibial erythema. Psychiatry: Judgement and insight appear normal. Mood &  affect appropriate.   Data Reviewed:   I have personally reviewed following labs and imaging studies:  Labs: Basic Metabolic Panel:  Recent Labs Lab 03/17/16 1709 03/17/16 1745  NA 137 140  K 3.8 4.0  CL 101 100*  CO2 28  --   GLUCOSE 103* 100*  BUN 19 22*  CREATININE 1.19 1.20  CALCIUM 9.5  --    GFR Estimated Creatinine Clearance: 102.1 mL/min (by C-G formula based on SCr of 1.2 mg/dL). Liver Function Tests:  Recent Labs Lab 03/17/16 1709  AST 17  ALT 18  ALKPHOS 53  BILITOT 1.4*  PROT 7.4  ALBUMIN 4.4   CBC:  Recent Labs Lab 03/17/16 1709 03/17/16 1745  WBC 6.4  --   NEUTROABS 4.5  --   HGB 15.6 16.0  HCT 47.0 47.0  MCV 86.1  --   PLT 186  --     Microbiology No results found for this or any previous visit (from the past 240 hour(s)).  Radiology: Ct Head Wo Contrast  Result Date: 03/17/2016 CLINICAL DATA:  Dizziness and blurred vision. Multiple small left-sided supratentorial infarcts shown on prior MRI dated 01/25/2016. EXAM: CT HEAD WITHOUT CONTRAST TECHNIQUE: Contiguous axial images were obtained from the base of the skull through the vertex without intravenous contrast. COMPARISON:  01/25/2016 MRI FINDINGS: Brain: Mega cisterna magna, unchanged. The multiple tiny infarcts shown in the left cerebrum on the 01/25/2016 exam are not well appreciated on today's CT, although on image 22 of series 201 a 4 mm hypodensity in the left corona radiata may reflect one of the prior lacunar infarcts. The brainstem, basal ganglia, ventricular system, and basilar cisterns appear unremarkable. Periventricular white matter and corona radiata hypodensities favor chronic ischemic microvascular white matter disease. No intracranial hemorrhage, mass lesion, or acute CVA. Vascular: Vascular calcification of the dominant left vertebral artery noted. Skull: Unremarkable Sinuses/Orbits: Right scleral band. Mild chronic left ethmoid sinusitis. Other: No supplemental non-categorized  findings. IMPRESSION: 1. No acute intracranial findings. 2. For the most part the numerous small left cerebral infarcts from the 01/25/2016 MRI are not readily apparent, although one of the previous small lacunar infarcts in the left corona radiata is visible as a hypodense lesion. 3. Periventricular white matter and corona radiata hypodensities favor chronic ischemic microvascular white matter disease. 4. Mild chronic left ethmoid sinusitis. 5. Mega cisterna magna. Electronically Signed   By: Van Clines M.D.   On: 03/17/2016 18:35   Mr Brain Wo Contrast  Result Date: 03/18/2016 CLINICAL DATA:  Multiple prior strokes, testicular cancer, presenting with vertigo. EXAM: MRI HEAD WITHOUT CONTRAST TECHNIQUE: Multiplanar, multiecho pulse sequences of the brain and surrounding structures were obtained without intravenous contrast. COMPARISON:  CT head 03/17/2016.  MR head 01/25/2016. FINDINGS: Brain: T2 shine through LEFT centrum semiovale representing subacute/chronic deep white matter infarction which was acute in July. No acute areas of restriction are evident. No hemorrhage, mass lesion, hydrocephalus, or extra-axial  fluid. Global atrophy. Chronic microvascular ischemic change. Vascular: Normal flow voids. No significant foci of chronic hemorrhage. Skull and upper cervical spine: No osseous lesion. Mega cisterna magna. Sinuses/Orbits: BILATERAL ocular surgery. BILATERAL perioptic nerve CSF prominence suggesting optic atrophy. No layering sinus fluid. Other: No middle ear or mastoid fluid. IMPRESSION: No acute stroke.  No cause for vertigo seen. Chronic changes as described with atrophy and small vessel disease. Electronically Signed   By: Staci Righter M.D.   On: 03/18/2016 08:48    Medications:   . allopurinol  100 mg Oral BID  . aspirin  325 mg Oral Daily  . atorvastatin  80 mg Oral Daily  . carvedilol  3.125 mg Oral BID WC  . ciprofloxacin  500 mg Oral Q breakfast  . colchicine  0.6 mg Oral BID    . [START ON 03/19/2016] cosyntropin  0.25 mg Intravenous Once  . enoxaparin (LOVENOX) injection  40 mg Subcutaneous Q24H  . escitalopram  10 mg Oral Daily  . furosemide  40 mg Oral Daily  . levothyroxine  200 mcg Oral QAC breakfast  . losartan  100 mg Oral Daily  . potassium chloride SA  20 mEq Oral Daily   Continuous Infusions:   Medical decision making is of high complexity and this patient is at high risk of deterioration, therefore this is a level 3 visit. He has new problems (vertigo, adrenal insufficiency) that require ongoing workup and coordination of care with neurology as well as cardiology for interrogation of his loop recorder. I spent an additional 35 minutes with this patient, from 10:30 AM-11:05 AM.   LOS: 0 days   Kerstyn Coryell  [facility] Pager [phone]. If unable to reach me by pager, please call my cell phone at [phone].  *Please refer to [email or web address], password [email or web address] to get updated schedule on who will round on this patient, as hospitalists switch teams weekly. If 7PM-7AM, please contact night-coverage at [email or web address], password [email or web address] for any overnight needs.  03/18/2016, 2:21 PM

## 2016-03-18 NOTE — Consult Note (Signed)
Neurology Consultation Reason for Consult: Vertigo Referring Physician: Rama, C  CC: Vertigo  History is obtained from: Patient  HPI: Dennis Speciale Nie Sr. is a 70 y.o. male with a history of stroke in July presents with intermittent episodes of vertigo for the past 10 days. He states that they seem to wax and wane, lasting for several hours when they come on and going away. They are accompanied by lightheadedness. In between, he continues to feel queasy. Given recent infarcts, an MRI was obtained which did not demonstrate any signs of posterior circulation infarct. He continues to have waxing and waning feeling of this.  Of note, there is some concern that he may have adrenal insufficiency.   ROS: A 14 point ROS was performed and is negative except as noted in the HPI.   Past Medical History:  Diagnosis Date  . Acute CVA (cerebrovascular accident) ([place]) 01/22/2016  . Arthritis    low back - DDD  . Cancer [facility])    Testicular Cancer  . Cellulitis of scrotum   . Cellulitis, scrotum 08/02/2014  . CHF (congestive heart failure) ([place])   . Chronic lower back pain    "from [place] 2007"  . Complication of anesthesia    Sometimes has N&V /w anesth.   . Coronary artery disease    Cath 2001  . Elevated PSA   . Epididymitis, left 08/04/2014  . History of chronic bronchitis   . History of gout   . Hypertension   . Hypocholesteremia   . Hypothyroidism   . Kidney stone   . Myocardial infarction [facility]) 2001   2001- cardiac cath., cardiac clearanece note dr Otho Perl 05-14-13 on chart, stress test results 02-21-12 on chart  . OSA on CPAP    cpap setting of 10  . Pneumonia 2000's and 2013  . PONV (postoperative nausea and vomiting)   . [facility] spotted fever   . Stroke [facility]) 2004   "right brain stem; no residual "     Family History  Problem Relation Age of Onset  . Cervical cancer Mother   . Diabetes type II Mother   . Hypertension Mother   . Stroke Mother   . Heart attack Mother   .  Dementia Father   . Diabetes type II Sister   . Hypertension Sister   . CAD Sister      Social History:  reports that he has never smoked. He has never used smokeless tobacco. He reports that he drinks alcohol. He reports that he does not use drugs.   Exam: Current vital signs: BP (!) 146/83 (BP Location: Left Arm)   Pulse 61   Temp 98.1 F (36.7 C) (Oral)   Resp 18   Ht 6\' 7"  (2.007 m)   Wt (!) 174.5 kg (384 lb 11.2 oz)   SpO2 96%   BMI 43.34 kg/m  Vital signs in last 24 hours: Temp:  [97.7 F (36.5 C)-98.4 F (36.9 C)] 98.1 F (36.7 C) (09/08 1729) Pulse Rate:  [53-92] 61 (09/08 1729) Resp:  [12-23] 18 (09/08 1729) BP: (131-152)/(77-98) 146/83 (09/08 1729) SpO2:  [88 %-100 %] 96 % (09/08 1729) Weight:  [174.5 kg (384 lb 11.2 oz)] 174.5 kg (384 lb 11.2 oz) (09/08 1000)   Physical Exam  Constitutional: Appears well-developed and well-nourished.  Psych: Affect appropriate to situation Eyes: No scleral injection HENT: No OP obstrucion Head: Normocephalic.  Cardiovascular: Normal rate and regular rhythm.  Respiratory: Effort normal and breath sounds normal to anterior ascultation  GI: Soft.  No distension. There is no tenderness.  Skin: WDI  Neuro: Mental Status: Patient is awake, alert, oriented to person, place, month, year, and situation. Patient is able to give a clear and coherent history. No signs of aphasia or neglect Cranial Nerves: II: Visual Fields are full. Pupils are equal, round, and reactive to light.   III,IV, VI: EOMI without ptosis or diploplia.  V: Facial sensation is symmetric to temperature VII: Facial movement is symmetric.  VIII: hearing is intact to voice X: Uvula elevates symmetrically XI: Shoulder shrug is symmetric. XII: tongue is midline without atrophy or fasciculations.  Motor: Tone is normal. Bulk is normal. 5/5 strength was present in all four extremities.  Sensory: Sensation is very mildly decreased on the  right Cerebellar: FNF and HKS are intact bilaterally      I have reviewed labs in epic and the results pertinent to this consultation are: CMP-unremarkable  I have reviewed the images obtained: MRI brain-negative  Impression: 70 year old male with queasiness, intermittent vertigo, lightheadedness. Given his history of vascular disease, I will check a CT angiogram to make sure he does not have posterior circulation insufficiency. He is on multiple medications which could contribute including Cipro, aspirin, Lasix, atorvastatin. Viral or postviral labyrinthitis could also be possibilities, though I feel this is less likely given the waxing and waning symptoms. Adrenal insufficiency also could certainly contribute to these symptoms.  Recommendations: 1) CT angiogram head and neck 2) consider changing aspirin to Plavix 3) ? If non fluoroquinolone option for prevention of UTIs would be an option. 4) agree with testing for adrenal insufficiency.   Roland Rack, MD [facility] [phone]  If 7pm- 7am, please page neurology on call as listed in [place].

## 2016-03-18 NOTE — Care Management Obs Status (Signed)
[facility] NOTIFICATION   Patient Details  Name: Desmone Diersen Weidmann Sr. MRN: [id number] Date of Birth: 1945-07-25   Medicare Observation Status Notification Given:  Yes (MRI negative)    Pollie Friar, RN 03/18/2016, 11:46 AM
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
## 2016-03-18 NOTE — Evaluation (Signed)
Physical Therapy Evaluation Patient Details Name: Dennis Rayan Mumaw Sr. MRN: [id number] DOB: 06-28-1946 Today's Date: 03/18/2016   History of Present Illness  pt is a 70 y/o male with pmh of strokes, testicular CA and new dx addison's ds, admitted with vertigo, mild for several days then day before admission with spinning and associated nausea.  Clinical Impression  Completed vestibular assessment with Q and A, occulomotor testing with saccades and VOR, negative and pursuits only mildly problematic and unsmooth suggesting a hypofunction. No ear ringing.  No signs of Horizontal or Post.  BPPV per supine head roll and Epply maneuvers.  Also physical exam with mobility and gait with challenges including scanning horizontally and vertically, abrupt directional changes and backing up all were negative for producing any deviation.   All suggestive of neuritis, etc given MRI negative.  No further acute PT needs, will sign off at this time.    Follow Up Recommendations No PT follow up    Equipment Recommendations  None recommended by PT    Recommendations for Other Services       Precautions / Restrictions Precautions Precautions: Fall      Mobility  Bed Mobility Overal bed mobility: Modified Independent                Transfers Overall transfer level: Needs assistance   Transfers: Sit to/from Stand Sit to Stand: Supervision         General transfer comment: for safety only due to potential for vertigo s/s  Ambulation/Gait Ambulation/Gait assistance: Supervision Ambulation Distance (Feet): 250 Feet Assistive device: None Gait Pattern/deviations: Step-through pattern Gait velocity: able to speed up appreciably, but no overly fast. Gait velocity interpretation: at or above normal speed for age/gender General Gait Details: Steady, but with wide BOS, no overt deviation with scanning L/R or up/down, abrupt turns or backing up.  Stairs            Wheelchair Mobility     Modified Rankin (Stroke Patients Only)       Balance Overall balance assessment: No apparent balance deficits (not formally assessed)   Sitting balance-Leahy Scale: Good       Standing balance-Leahy Scale: Good                               Pertinent Vitals/Pain Pain Assessment: No/denies pain    Home Living Family/patient expects to be discharged to:: Private residence Living Arrangements: Spouse/significant other Available Help at Discharge: Family;Available PRN/intermittently Type of Home: House Home Access: Stairs to enter Entrance Stairs-Rails: Right;Left Entrance Stairs-Number of Steps: 4 Home Layout: Two level;Able to live on main level with bedroom/bathroom Home Equipment: Shower seat;Walker - 2 wheels;Bedside commode;Hand held shower head;Crutches;Cane - single point      Prior Function Level of Independence: Independent         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  No s/s of nystagmus or overt vertigo except mild pursuit problem with lack of smoothness ?left.    Exercises        Assessment/Plan    PT Assessment Patent does not need any further PT services  PT Diagnosis     PT Problem List Other (comment) (mild dizziness with minimal gait disturbance)  PT Treatment Interventions     PT Goals (Current goals can be found in  the Care Plan section) Acute Rehab PT Goals PT Goal Formulation: All assessment and education complete, DC therapy    Frequency     Barriers to discharge        Co-evaluation               End of Session   Activity Tolerance: Patient tolerated treatment well Patient left: in bed;with call bell/phone within reach;with family/visitor present Nurse Communication: Mobility status    Functional Assessment Tool Used: clinical judgement Functional Limitation: Mobility: Walking and moving around Mobility: Walking and Moving Around Current Status [id number]): At least 1 percent but less than 20 percent impaired, limited or restricted Mobility: Walking and Moving Around Goal Status [phone]): At least 1 percent but less than 20 percent impaired, limited or restricted Mobility: Walking and Moving Around Discharge Status [phone]): At least 1 percent but less than 20 percent impaired, limited or restricted    Time: [id number] PT Time Calculation (min) (ACUTE ONLY): 39 min   Charges:   PT Evaluation $PT Eval Moderate Complexity: 1 Procedure PT Treatments $Neuromuscular Re-education: 8-22 mins $Canalith Rep Proc: 8-22 mins   PT G Codes:   PT G-Codes **NOT FOR INPATIENT CLASS** Functional Assessment Tool Used: clinical judgement Functional Limitation: Mobility: Walking and moving around Mobility: Walking and Moving Around Current Status [id number]): At least 1 percent but less than 20 percent impaired, limited or restricted Mobility: Walking and Moving Around Goal Status [phone]): At least 1 percent but less than 20 percent impaired, limited or restricted Mobility: Walking and Moving Around Discharge Status [phone]): At least 1 percent but less than 20 percent impaired, limited or restricted    Tyannah Sane, Tessie Fass 03/18/2016, 4:23 PM 03/18/2016  Donnella Sham, PT [phone]  (pager)

## 2016-03-19 ENCOUNTER — Encounter (HOSPITAL_COMMUNITY): Payer: Self-pay | Admitting: Radiology

## 2016-03-19 ENCOUNTER — Observation Stay (HOSPITAL_COMMUNITY): Payer: Medicare Other

## 2016-03-19 DIAGNOSIS — E2740 Unspecified adrenocortical insufficiency: Secondary | ICD-10-CM | POA: Diagnosis not present

## 2016-03-19 DIAGNOSIS — I6501 Occlusion and stenosis of right vertebral artery: Secondary | ICD-10-CM | POA: Diagnosis not present

## 2016-03-19 DIAGNOSIS — R42 Dizziness and giddiness: Secondary | ICD-10-CM | POA: Diagnosis not present

## 2016-03-19 DIAGNOSIS — I10 Essential (primary) hypertension: Secondary | ICD-10-CM | POA: Diagnosis not present

## 2016-03-19 DIAGNOSIS — I671 Cerebral aneurysm, nonruptured: Secondary | ICD-10-CM | POA: Diagnosis not present

## 2016-03-19 DIAGNOSIS — I6521 Occlusion and stenosis of right carotid artery: Secondary | ICD-10-CM | POA: Diagnosis not present

## 2016-03-19 DIAGNOSIS — I6522 Occlusion and stenosis of left carotid artery: Secondary | ICD-10-CM | POA: Diagnosis not present

## 2016-03-19 DIAGNOSIS — I25119 Atherosclerotic heart disease of native coronary artery with unspecified angina pectoris: Secondary | ICD-10-CM | POA: Diagnosis not present

## 2016-03-19 LAB — ACTH STIMULATION, 3 TIME POINTS
Cortisol, 30 Min: 11.3 ug/dL
Cortisol, 60 Min: 15 ug/dL
Cortisol, Base: 5.1 ug/dL

## 2016-03-19 MED ORDER — HYDROCORTISONE 5 MG PO TABS: 5.0000 mg | ORAL_TABLET | Freq: Two times a day (BID) | ORAL | Status: DC

## 2016-03-19 MED ORDER — HYDROCORTISONE 5 MG PO TABS: 5.0000 mg | ORAL_TABLET | Freq: Every day | ORAL | Status: DC

## 2016-03-19 MED ORDER — INDOMETHACIN 25 MG PO CAPS
50.0000 mg | ORAL_CAPSULE | Freq: Two times a day (BID) | ORAL | Status: DC
  Filled 2016-03-19: qty 2
  Filled 2016-03-19: qty 1

## 2016-03-19 MED ORDER — CLOPIDOGREL BISULFATE 75 MG PO TABS
75.0000 mg | ORAL_TABLET | Freq: Every day | ORAL | Status: DC
  Administered 2016-03-19: 75 mg via ORAL
  Filled 2016-03-19: qty 1

## 2016-03-19 MED ORDER — CLOPIDOGREL BISULFATE 75 MG PO TABS: 75.0000 mg | ORAL_TABLET | Freq: Every day | ORAL | 2 refills | Status: DC

## 2016-03-19 MED ORDER — HYDROCORTISONE 5 MG PO TABS: 5.0000 mg | ORAL_TABLET | Freq: Two times a day (BID) | ORAL | 2 refills | Status: DC

## 2016-03-19 MED ORDER — HYDROCORTISONE 10 MG PO TABS
10.0000 mg | ORAL_TABLET | Freq: Every day | ORAL | Status: DC
  Administered 2016-03-19: 10 mg via ORAL
  Filled 2016-03-19 (×2): qty 1

## 2016-03-19 MED ORDER — IOPAMIDOL (ISOVUE-370) INJECTION 76%
INTRAVENOUS | Status: AC
  Administered 2016-03-19: 50 mL
  Filled 2016-03-19: qty 50

## 2016-03-19 MED ORDER — INDOMETHACIN 25 MG PO CAPS
50.0000 mg | ORAL_CAPSULE | Freq: Two times a day (BID) | ORAL | Status: DC
  Administered 2016-03-19: 50 mg via ORAL
  Filled 2016-03-19 (×2): qty 2

## 2016-03-19 NOTE — Discharge Summary (Signed)
Physician Discharge Summary  Dennis Klemens Conger Sr. [id number] DOB: Mar 24, 1946 DOA: 03/17/2016  PCP: Wilhemena Durie, MD  Admit date: 03/17/2016 Discharge date: 03/19/2016  Admitted From: Home Discharge disposition: Home   Recommendations for Outpatient Follow-Up:   1. Recommend outpatient F/U with endocrinologist (appt scheduled per patient).   Discharge Diagnosis:   Principal Problem:    Adrenal insufficiency (HCC) Active Problems:    CAD (coronary artery disease)    OSA on CPAP    Morbid obesity (HCC)    Stroke (cerebrum) (HCC)    Essential hypertension    Vertigo   Discharge Condition: Improved.  Diet recommendation: Low sodium, heart healthy.    History of Present Illness:   Dennis Goertz Sr. is an 70 y.o. male with a history of multiple prior strokes (admitted twice in July for left MCA CVA with expansion, status post TEE and loop recorder implantation), CAD (no regional wall motion abnormalities on TEE done 01/27/16, EF 55-60 percent), HTN, HLD, OSA managed with CPAP, testicular cancer, and thyroid disease who presented to the ED from his PCP's office for evaluation of vertigo on 03/17/16. Of note the patient had a recent abnormal cortisol level and had been referred to endocrinology but has not yet had his appointment.  Hospital Course by Problem:   Principal Problem:    Adrenal Insufficiency Cortisol noted to be low last hospitalization.  Cosyntropin test performed with abnormal results noted.  Put on hydrocortisone replacement.  No evidence of mineralocorticoid deficiency and he has had normal electrolytes, but will need close F/U with an endocrinologist to evaluate whether this is a primary adrenal problem or if it is a pituitary problem given hypothyroidism and low libido. Have instructed the patient how to increase his dose of hydrocortisone in the face of minor illnesses. Suspect vertigo related to adrenal insufficiency.  Felt better with ACTH  administration.  Active Problems:     Vertigo with a history of left hemispheric cerebral infarcts Observed overnight. MRI of the head done 01/25/16 showed multiple left hemispheric acute/subacute infarcts within increase in size of the left caudate infarct and a new infarct in the anterior lateral margin of the left lenticular nucleus as well as left motor strip when compared to MRI done 01/21/16. Repeat MRI negative for acute stroke. Currently being managed with Plavix, statin and risk factor modification. Neurologist consulted with recommendations to proceed with CT angiogram of the head and neck. No flow limiting stenosis visualized. Vertebrobasilar system widely patent other than an irregularity within the V4 segments bilaterally.    CAD (coronary artery disease) Continue Plavix, statin, beta blocker.    OSA on CPAP Continue CPAP at at bedtime.    Morbid obesity ([place]) Dietitian provided diet counseling 03/18/16.    Essential hypertension Continue Coreg, Lasix, and Cozaar.    Gout Continue allopurinol and colchicine.    Hypothyroidism Continue Synthroid.    Depression Continue Lexapro.    Cellulitis versus venous stasis dermatitis Put on Keflex for venous stasis with concern for cellulitis. Findings more consistent with venous stasis. Discontinue Keflex.     Chronic UTI On chronic treatment with Cipro for suppression.   Medical Consultants:    Neurology   Discharge Exam:   Vitals:   03/19/16 0751 03/19/16 0906  BP: (!) 140/93 125/74  Pulse: 63 68  Resp:  18  Temp:  98.3 F (36.8 C)   Vitals:   03/19/16 0128 03/19/16 0508 03/19/16 0751 03/19/16 0906  BP: 122/60 127/85 (!) 140/93 125/74  Pulse: 60 62 63 68  Resp: 20 20  18   Temp: 98.7 F (37.1 C) 98.2 F (36.8 C)  98.3 F (36.8 C)  TempSrc: Oral Oral  Oral  SpO2: 98% 100%  99%  Weight:      Height:        General exam: Appears calm and comfortable.  Respiratory system: Clear to auscultation,  diminished. Respiratory effort normal. Cardiovascular system: S1 & S2 heard, RRR. No JVD,  rubs, gallops or clicks. No murmurs. Gastrointestinal system: Abdomen is nondistended, soft and nontender. No organomegaly or masses felt. Normal bowel sounds heard. Central nervous system: Alert and oriented. No focal neurological deficits. Extremities: No clubbing,  or cyanosis. 2-3+ edema. Skin: Venous stasis dermatitis bilateral lower extremities with anterior tibial erythema. Psychiatry: Judgement and insight appear normal. Mood & affect appropriate.    The results of significant diagnostics from this hospitalization (including imaging, microbiology, ancillary and laboratory) are listed below for reference.     Procedures and Diagnostic Studies:   Ct Head Wo Contrast  Result Date: 03/17/2016 CLINICAL DATA:  Dizziness and blurred vision. Multiple small left-sided supratentorial infarcts shown on prior MRI dated 01/25/2016. EXAM: CT HEAD WITHOUT CONTRAST TECHNIQUE: Contiguous axial images were obtained from the base of the skull through the vertex without intravenous contrast. COMPARISON:  01/25/2016 MRI FINDINGS: Brain: Mega cisterna magna, unchanged. The multiple tiny infarcts shown in the left cerebrum on the 01/25/2016 exam are not well appreciated on today's CT, although on image 22 of series 201 a 4 mm hypodensity in the left corona radiata may reflect one of the prior lacunar infarcts. The brainstem, basal ganglia, ventricular system, and basilar cisterns appear unremarkable. Periventricular white matter and corona radiata hypodensities favor chronic ischemic microvascular white matter disease. No intracranial hemorrhage, mass lesion, or acute CVA. Vascular: Vascular calcification of the dominant left vertebral artery noted. Skull: Unremarkable Sinuses/Orbits: Right scleral band. Mild chronic left ethmoid sinusitis. Other: No supplemental non-categorized findings. IMPRESSION: 1. No acute intracranial  findings. 2. For the most part the numerous small left cerebral infarcts from the 01/25/2016 MRI are not readily apparent, although one of the previous small lacunar infarcts in the left corona radiata is visible as a hypodense lesion. 3. Periventricular white matter and corona radiata hypodensities favor chronic ischemic microvascular white matter disease. 4. Mild chronic left ethmoid sinusitis. 5. Mega cisterna magna. Electronically Signed   By: Van Clines M.D.   On: 03/17/2016 18:35   Mr Brain Wo Contrast  Result Date: 03/18/2016 CLINICAL DATA:  Multiple prior strokes, testicular cancer, presenting with vertigo. EXAM: MRI HEAD WITHOUT CONTRAST TECHNIQUE: Multiplanar, multiecho pulse sequences of the brain and surrounding structures were obtained without intravenous contrast. COMPARISON:  CT head 03/17/2016.  MR head 01/25/2016. FINDINGS: Brain: T2 shine through LEFT centrum semiovale representing subacute/chronic deep white matter infarction which was acute in July. No acute areas of restriction are evident. No hemorrhage, mass lesion, hydrocephalus, or extra-axial fluid. Global atrophy. Chronic microvascular ischemic change. Vascular: Normal flow voids. No significant foci of chronic hemorrhage. Skull and upper cervical spine: No osseous lesion. Mega cisterna magna. Sinuses/Orbits: BILATERAL ocular surgery. BILATERAL perioptic nerve CSF prominence suggesting optic atrophy. No layering sinus fluid. Other: No middle ear or mastoid fluid. IMPRESSION: No acute stroke.  No cause for vertigo seen. Chronic changes as described with atrophy and small vessel disease. Electronically Signed   By: Staci Righter M.D.   On: 03/18/2016 08:48     Labs:   Basic Metabolic Panel:  Recent Labs Lab 03/17/16 1709 03/17/16 1745  NA 137 140  K 3.8 4.0  CL 101 100*  CO2 28  --   GLUCOSE 103* 100*  BUN 19 22*  CREATININE 1.19 1.20  CALCIUM 9.5  --    GFR Estimated Creatinine Clearance: 102.1 mL/min (by  C-G formula based on SCr of 1.2 mg/dL). Liver Function Tests:  Recent Labs Lab 03/17/16 1709  AST 17  ALT 18  ALKPHOS 53  BILITOT 1.4*  PROT 7.4  ALBUMIN 4.4    Recent Labs Lab 03/18/16 2227  AMMONIA 15    CBC:  Recent Labs Lab 03/17/16 1709 03/17/16 1745  WBC 6.4  --   NEUTROABS 4.5  --   HGB 15.6 16.0  HCT 47.0 47.0  MCV 86.1  --   PLT 186  --    Cosyntropin Test  Ref. Range 03/19/2016 07:10  Cortisol, Base Latest Units: ug/dL 5.1  Cortisol, 30 Min Latest Units: ug/dL 11.3  Cortisol, 60 Min Latest Units: ug/dL 15.0    Discharge Instructions:   Discharge Instructions    Call MD for:  extreme fatigue    Complete by:  As directed   Call MD for:  persistant dizziness or light-headedness    Complete by:  As directed   Call MD for:  temperature >100.4    Complete by:  As directed   Diet - low sodium heart healthy    Complete by:  As directed   Discharge instructions    Complete by:  As directed   For minor illnesses, increase your hydrocortisone dose to 30 mg in the morning and 15 mg in the evening for three days & call your PCP.   Increase activity slowly    Complete by:  As directed       Medication List    STOP taking these medications   aspirin 325 MG tablet   cephALEXin 500 MG capsule Commonly known as:  KEFLEX     TAKE these medications   allopurinol 100 MG tablet Commonly known as:  ZYLOPRIM Take 100 mg by mouth 2 (two) times daily.   atorvastatin 80 MG tablet Commonly known as:  LIPITOR Take 1 tablet (80 mg total) by mouth daily.   carvedilol 3.125 MG tablet Commonly known as:  COREG Take 1 tablet (3.125 mg total) by mouth 2 (two) times daily with a meal.   ciprofloxacin 500 MG tablet Commonly known as:  CIPRO Take 500 mg by mouth daily with breakfast.   clopidogrel 75 MG tablet Commonly known as:  PLAVIX Take 1 tablet (75 mg total) by mouth daily.   colchicine 0.6 MG tablet Take 1 tablet (0.6 mg total) by mouth 2 (two) times  daily.   EPINEPHrine 0.3 mg/0.3 mL Soaj injection Commonly known as:  EPI-PEN Inject 0.3 mLs (0.3 mg total) into the muscle as needed (allergic reaction).   escitalopram 10 MG tablet Commonly known as:  LEXAPRO Take 1 tablet (10 mg total) by mouth daily.   furosemide 40 MG tablet Commonly known as:  LASIX Take 1 tablet (40 mg total) by mouth daily. What changed:  Another medication with the same name was removed. Continue taking this medication, and follow the directions you see here.   hydrocortisone 5 MG tablet Commonly known as:  CORTEF Take 1-2 tablets (5-10 mg total) by mouth 2 (two) times daily. Take 10 mg in the morning and 5 mg in the evening.   indomethacin 50 MG capsule Commonly known as:  INDOCIN Take 1 capsule (50 mg total) by mouth 2 (two) times daily with a meal.   levothyroxine 200 MCG tablet Commonly known as:  SYNTHROID, LEVOTHROID Take 1 tablet (200 mcg total) by mouth daily before breakfast.   losartan 100 MG tablet Commonly known as:  COZAAR Take 100 mg by mouth daily.   oxyCODONE-acetaminophen 5-325 MG tablet Commonly known as:  PERCOCET/ROXICET Take 1 tablet by mouth every 6 (six) hours as needed.   polyethylene glycol packet Commonly known as:  MIRALAX / GLYCOLAX Take 17 g by mouth daily as needed for mild constipation.   potassium chloride SA 20 MEQ tablet Commonly known as:  K-DUR,KLOR-CON Take 1 tablet (20 mEq total) by mouth daily.      Follow-up Information    Wilhemena Durie, MD. Schedule an appointment as soon as possible for a visit in 2 week(s).   Specialty:  Family Medicine Why:  Hospital follow up.   Contact information: [address] [place] [address] [phone]            Time coordinating discharge: >35 minutes.  Signed:  RAMA,CHRISTINA  Pager [phone] [facility] 03/19/2016, 11:27 AM

## 2016-03-19 NOTE — Progress Notes (Signed)
Subjective: Feels much better following cosyntropin test this AM.   Exam: Vitals:   03/19/16 0751 03/19/16 0906  BP: (!) 140/93 125/74  Pulse: 63 68  Resp:  18  Temp:  98.3 F (36.8 C)   Gen: In bed, NAD Resp: non-labored breathing, no acute distress Abd: soft, nt  Neuro: MS: awake, alert oriented [id number], EOMI Motor: MAEW Sensory:intact to LT  PT eval with no further recs.   ACTH stim test, 60 minute 15.1  CTA - mild carotid stenosis, no lesions to explain symptoms  Impression: 70 yo M with dizziness, nausea. His low cortisol following stimulation appears to suggest adrenal insufficiency. His improved symptoms following ACTH I think also could suggest this. I think that at this time, this explains his symptoms reasonably well.  Recommendations: 1) adrenal insufficiency treatment per IM 2) If he were to develop further symptoms, could consider changing cipro to a non-fluoroquinalone antibiotic.  3) No further recommendations at this time. Please call with further questions or concerns.   Roland Rack, MD [facility] [phone]  If 7pm- 7am, please page neurology on call as listed in [place].

## 2016-03-19 NOTE — Progress Notes (Signed)
RN discussed discharge instructions with patient, patient verbalized understanding of f/u appts, changes to medications. States he has already made f/u appt with endocrinologist. Patient's neuro assessment unchanged, IV removed, tele removed, CCMD notified. Patient denies any pain, states his chronic pain is relieved.

## 2016-03-22 ENCOUNTER — Other Ambulatory Visit: Payer: Self-pay

## 2016-03-22 NOTE — Patient Outreach (Signed)
[facility]) Care Management  03/22/2016  Dennis Nations Blatchford Sr. Oct 27, 1945 [id number]   REFERRAL SOURCE: EMMI stroke transition program REFERRAL REASON: EMMI stroke RED alert CONSENT: self/ patient   SUBJECTIVE; Telephone call to patient regarding EMMI stroke follow up.  HIPAA verfied with patient.  Patient states he is doing very well now.  Patient states he was admitted to the hospital from his primary MD office visit on 03/17/16 due to vertigo.  Patient reports after having a work up his was discharged on 03/19/16. Patient states he does have Addisons disease.  Patient states he has a follow up appointment scheduled with an endocrinologist on 03/31/16.  Patient states he is suppose to have additional testing done on his pituitary gland at that time. . Patient states he was put on Cortef medication due to the addisons.  Patient states it has helped him tremendously. Patient reports after 17 years he is now without pain.  Patient denies any additional symptoms of vertigo as well. Patient reports he was instructed by his doctor to take double of the cortef if he became sick.  Patient states he will discuss the medication  Adjustments with the doctor at his next visit. Patient states he was also started on Plavix.  RNCM discussed signs of bleeding with patient.  Advised patient to report any unusual signs to his doctor.  Patient reports he feels comfortable with his understanding of Addisons.  States he has done a lot of research regarding the disease.  Patient denies any additional symptoms related to his stroke. States no residual symptoms from the stroke. Patient reports he is aware of how to contact his doctors after hours. Patient reports he is aware of signs/ symptoms of stroke. Patient knows to contact 911 for stroke like symptoms.  Patient verbally agreed with closing out EMMI stroke follow up with RNCM. Patient expressed appreciation of service and follow up.   ASSESSMENT;  EMMI  stroke program. Patient self managing care. Patient keeps follow up doctor appointments. Patient continues to take his medications as directed.  Patient denies need for additional education related to his Addison's disease.    PLAN; RNCM  Will close patient due patient being assessed no further intervention needed.  RNCM will refer patient to [place] [facility] to close due to no further intervention needed.   Quinn Plowman RN,BSN,CCM [facility] Telephonic  [phone]

## 2016-03-23 LAB — CUP PACEART REMOTE DEVICE CHECK: Date Time Interrogation Session: 20170818190857

## 2016-03-25 ENCOUNTER — Other Ambulatory Visit: Payer: Self-pay | Admitting: Family Medicine

## 2016-03-25 NOTE — Telephone Encounter (Signed)
Dr. Gilbert's pt.   Thanks,   -Laura  

## 2016-03-28 ENCOUNTER — Ambulatory Visit (INDEPENDENT_AMBULATORY_CARE_PROVIDER_SITE_OTHER): Payer: Medicare Other | Admitting: *Deleted

## 2016-03-28 DIAGNOSIS — I639 Cerebral infarction, unspecified: Secondary | ICD-10-CM | POA: Diagnosis not present

## 2016-03-28 NOTE — Progress Notes (Signed)
Carelink Summary Report / Loop Recorder 

## 2016-03-31 ENCOUNTER — Encounter: Payer: Self-pay | Admitting: Endocrinology

## 2016-03-31 ENCOUNTER — Ambulatory Visit (INDEPENDENT_AMBULATORY_CARE_PROVIDER_SITE_OTHER): Payer: Medicare Other | Admitting: Endocrinology

## 2016-03-31 VITALS — BP 152/98 | HR 74 | Temp 98.2°F | Ht 79.0 in | Wt 381.4 lb

## 2016-03-31 DIAGNOSIS — E2749 Other adrenocortical insufficiency: Secondary | ICD-10-CM

## 2016-03-31 DIAGNOSIS — I639 Cerebral infarction, unspecified: Secondary | ICD-10-CM

## 2016-03-31 DIAGNOSIS — E039 Hypothyroidism, unspecified: Secondary | ICD-10-CM

## 2016-03-31 DIAGNOSIS — R6882 Decreased libido: Secondary | ICD-10-CM | POA: Diagnosis not present

## 2016-03-31 LAB — LUTEINIZING HORMONE: LH: 6.2 m[IU]/mL (ref 3.10–34.60)

## 2016-03-31 LAB — BASIC METABOLIC PANEL
BUN: 19 mg/dL (ref 6–23)
CO2: 33 mEq/L — ABNORMAL HIGH (ref 19–32)
Calcium: 9.3 mg/dL (ref 8.4–10.5)
Chloride: 102 mEq/L (ref 96–112)
Creatinine, Ser: 1.12 mg/dL (ref 0.40–1.50)
GFR: 68.8 mL/min (ref >60.00)
Glucose, Bld: 96 mg/dL (ref 70–99)
Potassium: 3.8 mEq/L (ref 3.5–5.1)
Sodium: 139 mEq/L (ref 135–145)

## 2016-03-31 LAB — T4, FREE: Free T4: 1.09 ng/dL (ref 0.60–1.60)

## 2016-03-31 LAB — TSH: TSH: 2.08 u[IU]/mL (ref 0.35–4.50)

## 2016-03-31 MED ORDER — HYDROCORTISONE 10 MG PO TABS: 10.0000 mg | ORAL_TABLET | Freq: Two times a day (BID) | ORAL | 3 refills | Status: DC
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
## 2016-03-31 NOTE — Patient Instructions (Addendum)
Take 2nd dose of hydrocortisone at 4-5 pm

## 2016-04-01 LAB — INSULIN-LIKE GROWTH FACTOR: Insulin-Like GF-1: 100 ng/mL (ref 47–192)

## 2016-04-01 LAB — TESTOSTERONE, FREE, TOTAL, SHBG
Sex Hormone Binding: 45.6 nmol/L (ref 19.3–76.4)
Testosterone, Free: 1.3 pg/mL — ABNORMAL LOW (ref 6.6–18.1)
Testosterone: 186 ng/dL — ABNORMAL LOW (ref 264–916)

## 2016-04-01 LAB — PROLACTIN: Prolactin: 19.9 ng/mL — ABNORMAL HIGH (ref 4.0–15.2)

## 2016-04-03 NOTE — Progress Notes (Signed)
Please let patient know that the testosterone and prolactin hormone tests are abnormal, need dedicated pituitary MRI if he agrees. Rest ok

## 2016-04-06 ENCOUNTER — Other Ambulatory Visit: Payer: Self-pay | Admitting: Endocrinology

## 2016-04-06 DIAGNOSIS — E230 Hypopituitarism: Secondary | ICD-10-CM

## 2016-04-07 ENCOUNTER — Other Ambulatory Visit: Payer: Self-pay | Admitting: Family Medicine

## 2016-04-07 ENCOUNTER — Ambulatory Visit (INDEPENDENT_AMBULATORY_CARE_PROVIDER_SITE_OTHER): Payer: Medicare Other | Admitting: Family Medicine

## 2016-04-07 ENCOUNTER — Encounter: Payer: Self-pay | Admitting: Family Medicine

## 2016-04-07 VITALS — BP 142/88 | HR 70 | Temp 97.5°F | Resp 16 | Wt 360.0 lb

## 2016-04-07 DIAGNOSIS — E271 Primary adrenocortical insufficiency: Secondary | ICD-10-CM

## 2016-04-07 DIAGNOSIS — I639 Cerebral infarction, unspecified: Secondary | ICD-10-CM | POA: Diagnosis not present

## 2016-04-07 DIAGNOSIS — Z09 Encounter for follow-up examination after completed treatment for conditions other than malignant neoplasm: Secondary | ICD-10-CM

## 2016-04-07 DIAGNOSIS — E7800 Pure hypercholesterolemia, unspecified: Secondary | ICD-10-CM

## 2016-04-07 MED ORDER — ESCITALOPRAM OXALATE 10 MG PO TABS: 10.0000 mg | ORAL_TABLET | Freq: Every day | ORAL | 11 refills | Status: DC

## 2016-04-07 MED ORDER — CLOPIDOGREL BISULFATE 75 MG PO TABS: 75.0000 mg | ORAL_TABLET | Freq: Every day | ORAL | 12 refills | Status: DC

## 2016-04-07 MED ORDER — CIPROFLOXACIN HCL 500 MG PO TABS: 500.0000 mg | ORAL_TABLET | Freq: Every day | ORAL | 12 refills | Status: DC

## 2016-04-07 MED ORDER — HYDROCORTISONE 10 MG PO TABS: 10.0000 mg | ORAL_TABLET | Freq: Two times a day (BID) | ORAL | 12 refills | Status: DC

## 2016-04-07 MED ORDER — LEVOTHYROXINE SODIUM 200 MCG PO TABS: 200.0000 ug | ORAL_TABLET | Freq: Every day | ORAL | 11 refills | Status: DC

## 2016-04-07 MED ORDER — INDOMETHACIN 50 MG PO CAPS: 50.0000 mg | ORAL_CAPSULE | Freq: Two times a day (BID) | ORAL | 12 refills | Status: DC

## 2016-04-07 MED ORDER — LOSARTAN POTASSIUM 100 MG PO TABS: 100.0000 mg | ORAL_TABLET | Freq: Every day | ORAL | 12 refills | Status: DC

## 2016-04-07 MED ORDER — ALLOPURINOL 100 MG PO TABS: 100.0000 mg | ORAL_TABLET | Freq: Two times a day (BID) | ORAL | 12 refills | Status: DC

## 2016-04-07 MED ORDER — CARVEDILOL 3.125 MG PO TABS: 3.1250 mg | ORAL_TABLET | Freq: Two times a day (BID) | ORAL | 12 refills | Status: DC

## 2016-04-07 MED ORDER — FUROSEMIDE 40 MG PO TABS: 40.0000 mg | ORAL_TABLET | Freq: Every day | ORAL | 12 refills | Status: DC

## 2016-04-07 MED ORDER — POTASSIUM CHLORIDE CRYS ER 20 MEQ PO TBCR: 20.0000 meq | EXTENDED_RELEASE_TABLET | Freq: Every day | ORAL | 12 refills | Status: DC

## 2016-04-07 MED ORDER — ATORVASTATIN CALCIUM 80 MG PO TABS: 80.0000 mg | ORAL_TABLET | Freq: Every day | ORAL | 12 refills | Status: DC

## 2016-04-07 MED ORDER — COLCHICINE 0.6 MG PO TABS: 0.6000 mg | ORAL_TABLET | Freq: Two times a day (BID) | ORAL | 12 refills | Status: DC

## 2016-04-07 NOTE — Progress Notes (Signed)
Subjective:  HPI Hospital stay 03/17/16-03/19/16  Dx: Adrenal gland insufficiency- cortisol level low. Put on hydrocortisone replacement. F/u with endocrinologist. Diagnosed with Addison's Disease.Going through workup of pituitary/ adrenal disease.  Vertigo with hx of stroke- CT angiogram done stable.  Cellulitis vs venous statis dermatitis D/c Keflex. Findings more consistent with venous statis. D/c ASA. The dizziness from the stroke has resolved.   Pt also needs refills today on all medications as well.   Prior to Admission medications   Medication Sig Start Date End Date Taking? Authorizing Provider  allopurinol (ZYLOPRIM) 100 MG tablet Take 100 mg by mouth 2 (two) times daily. 11/24/15  Yes Historical Provider, MD  atorvastatin (LIPITOR) 80 MG tablet Take 1 tablet (80 mg total) by mouth daily. 01/23/16  Yes Eloise Levels, MD  carvedilol (COREG) 3.125 MG tablet TAKE 1 TABLET(3.125 MG) BY MOUTH TWICE DAILY [facility] 03/25/16  Yes Dennis E Chrismon, PA  ciprofloxacin (CIPRO) 500 MG tablet Take 500 mg by mouth daily with breakfast.   Yes Historical Provider, MD  clopidogrel (PLAVIX) 75 MG tablet Take 1 tablet (75 mg total) by mouth daily. 03/19/16  Yes Venetia Maxon Rama, MD  colchicine 0.6 MG tablet Take 1 tablet (0.6 mg total) by mouth 2 (two) times daily. 12/15/15  Yes Richard Maceo Pro., MD  EPINEPHrine 0.3 mg/0.3 mL IJ SOAJ injection Inject 0.3 mLs (0.3 mg total) into the muscle as needed (allergic reaction). 12/15/15  Yes Richard Maceo Pro., MD  escitalopram (LEXAPRO) 10 MG tablet Take 1 tablet (10 mg total) by mouth daily. 12/15/15  Yes Richard Maceo Pro., MD  furosemide (LASIX) 40 MG tablet Take 1 tablet (40 mg total) by mouth daily. 03/02/16  Yes Richard Maceo Pro., MD  hydrocortisone (CORTEF) 10 MG tablet Take 1 tablet (10 mg total) by mouth 2 (two) times daily. 03/31/16  Yes Elayne Snare, MD  indomethacin (INDOCIN) 50 MG capsule Take 1 capsule (50 mg total) by mouth 2 (two) times daily  with a meal. 08/27/15  Yes Jerrol Banana., MD  levothyroxine (SYNTHROID, LEVOTHROID) 200 MCG tablet Take 1 tablet (200 mcg total) by mouth daily before breakfast. 12/15/15  Yes Jerrol Banana., MD  losartan (COZAAR) 100 MG tablet Take 100 mg by mouth daily. 03/11/16  Yes Historical Provider, MD  oxyCODONE-acetaminophen (PERCOCET/ROXICET) 5-325 MG tablet Take 1 tablet by mouth every 6 (six) hours as needed.   Yes Historical Provider, MD  polyethylene glycol (MIRALAX / GLYCOLAX) packet Take 17 g by mouth daily as needed for mild constipation.  08/15/15  Yes Historical Provider, MD  potassium chloride SA (K-DUR,KLOR-CON) 20 MEQ tablet Take 1 tablet (20 mEq total) by mouth daily. 12/15/15  Yes Richard Maceo Pro., MD    Patient Active Problem List   Diagnosis Date Noted  . Adrenal insufficiency ([place]) 03/18/2016  . Vertigo 03/17/2016  . AKI (acute kidney injury) ([place])   . Stroke (cerebrum) ([place])   . HLD (hyperlipidemia)   . Essential hypertension   . Hypotension due to drugs   . Palpitations   . Right sided weakness 01/21/2016  . Acquired hypothyroidism 11/04/2015  . Arthritis 11/04/2015  . Decreased libido 11/04/2015  . Narrowing of intervertebral disc space 11/04/2015  . Major depressive disorder, single episode, mild ([place]) 11/04/2015  . H/o Lyme disease 11/04/2015  . Hypercholesterolemia 11/04/2015  . Hypotestosteronism 11/04/2015  . Morbid obesity ([place]) 11/04/2015  . Temporary cerebral vascular dysfunction 11/04/2015  . Chronic tophaceous gout  09/21/2015  . Blood glucose elevated 06/02/2015  . Benign prostatic hyperplasia with urinary obstruction 09/26/2014  . OSA on CPAP 08/03/2014  . Diastolic CHF ([place]) 99991111  . CAD (coronary artery disease) 08/02/2014  . Bursitis, trochanteric 05/15/2014  . S/P lumbar spinal fusion 07/17/2013  . Chronic back pain     Past Medical History:  Diagnosis Date  . Acute CVA (cerebrovascular accident) ([place]) 01/22/2016  . Arthritis     low back - DDD  . Cancer [facility])    Testicular Cancer  . Cellulitis of scrotum   . Cellulitis, scrotum 08/02/2014  . CHF (congestive heart failure) ([place])   . Chronic lower back pain    "from [place] 2007"  . Complication of anesthesia    Sometimes has N&V /w anesth.   . Coronary artery disease    Cath 2001  . Elevated PSA   . Epididymitis, left 08/04/2014  . History of chronic bronchitis   . History of gout   . Hypertension   . Hypocholesteremia   . Hypothyroidism   . Kidney stone   . Myocardial infarction [facility]) 2001   2001- cardiac cath., cardiac clearanece note dr Otho Perl 05-14-13 on chart, stress test results 02-21-12 on chart  . OSA on CPAP    cpap setting of 10  . Pneumonia 2000's and 2013  . PONV (postoperative nausea and vomiting)   . [facility] spotted fever   . Stroke [facility]) 2004   "right brain stem; no residual "    Social History   Social History  . Marital status: Married    Spouse name: N/A  . Number of children: 1  . Years of education: N/A   Occupational History  . Retired-works at [employer] History Main Topics  . Smoking status: Never Smoker  . Smokeless tobacco: Never Used  . Alcohol use Yes     Comment: " I drink wine about a year ago" 08/07/15  . Drug use: No  . Sexual activity: No   Other Topics Concern  . Not on file   Social History Narrative  . No narrative on file    Allergies  Allergen Reactions  . Bee Venom Anaphylaxis  . Shrimp [Shellfish Allergy] Anaphylaxis    "just shrimp"  . Stadol [Butorphanol] Anaphylaxis    respiratory  Distress, couldn't breathe, cardiac arrest  . Wasp Venom Anaphylaxis  . Allopurinol Other (See Comments)    500 mg daily causes lethargy, dizziness; tolerates 200 mg daily    Review of Systems  Constitutional: Positive for malaise/fatigue.  Eyes: Negative.   Respiratory: Negative.   Cardiovascular: Negative.   Gastrointestinal: Negative.   Genitourinary: Negative.   Musculoskeletal: Positive for  joint pain.  Skin: Negative.   Neurological: Positive for dizziness and headaches.       Shaking  Endo/Heme/Allergies: Negative.   Psychiatric/Behavioral: Negative.     Immunization History  Administered Date(s) Administered  . Pneumococcal Conjugate-13 06/10/2014  . Pneumococcal-Unspecified 03/11/2010  . Tdap 10/01/2009   Objective:  BP (!) 142/88 (BP Location: Left Arm, Patient Position: Sitting, Cuff Size: Large)   Pulse 70   Temp 97.5 F (36.4 C) (Oral)   Resp 16   Wt (!) 360 lb (163.3 kg)   SpO2 97%   BMI 40.56 kg/m   Physical Exam  Constitutional: He is oriented to person, place, and time and well-developed, well-nourished, and in no distress.  Morbidly obese white male in no acute distress today  HENT:  Head: Normocephalic  and atraumatic.  Right Ear: External ear normal.  Left Ear: External ear normal.  Nose: Nose normal.  Eyes: Conjunctivae and EOM are normal. Pupils are equal, round, and reactive to light.  Neck: Normal range of motion. Neck supple.  Cardiovascular: Normal rate, regular rhythm, normal heart sounds and intact distal pulses.   Pulmonary/Chest: Effort normal and breath sounds normal.  Abdominal: Soft.  Musculoskeletal: Normal range of motion. He exhibits edema.  He does have on support hose today.  Neurological: He is alert and oriented to person, place, and time. He has normal reflexes. Gait normal. GCS score is 15.  Skin: Skin is warm and dry.  Psychiatric: Mood, memory, affect and judgment normal.    Lab Results  Component Value Date   WBC 6.4 03/17/2016   HGB 16.0 03/17/2016   HCT 47.0 03/17/2016   PLT 186 03/17/2016   GLUCOSE 96 03/31/2016   CHOL 220 (H) 01/22/2016   TRIG 180 (H) 01/22/2016   HDL 35 (L) 01/22/2016   LDLCALC 149 (H) 01/22/2016   TSH 2.08 03/31/2016   INR 1.17 01/25/2016   HGBA1C 5.8 (H) 01/22/2016    CMP     Component Value Date/Time   NA 139 03/31/2016 1505   NA 128 (A) 06/02/2015   NA 139 08/11/2012 1640     K 3.8 03/31/2016 1505   K 3.6 08/11/2012 1640   CL 102 03/31/2016 1505   CL 102 08/11/2012 1640   CO2 33 (H) 03/31/2016 1505   CO2 30 08/11/2012 1640   GLUCOSE 96 03/31/2016 1505   GLUCOSE 85 08/11/2012 1640   BUN 19 03/31/2016 1505   BUN 12 06/02/2015   BUN 14 08/11/2012 1640   CREATININE 1.12 03/31/2016 1505   CREATININE 0.98 08/11/2012 1640   CALCIUM 9.3 03/31/2016 1505   CALCIUM 9.2 08/11/2012 1640   PROT 7.4 03/17/2016 1709   PROT 8.6 (H) 08/11/2012 1640   ALBUMIN 4.4 03/17/2016 1709   ALBUMIN 4.5 08/11/2012 1640   AST 17 03/17/2016 1709   AST 20 08/11/2012 1640   ALT 18 03/17/2016 1709   ALT 25 08/11/2012 1640   ALKPHOS 53 03/17/2016 1709   ALKPHOS 91 08/11/2012 1640   BILITOT 1.4 (H) 03/17/2016 1709   BILITOT 1.3 (H) 08/11/2012 1640   GFRNONAA >60 03/17/2016 1709   GFRNONAA >60 08/11/2012 1640   GFRAA >60 03/17/2016 1709   GFRAA >60 08/11/2012 1640    Assessment and Plan :  1. Hospital discharge follow-up   2. Cerebral infarction due to unspecified mechanism This has been an continues to be thoroughly evaluated. Significant from dizziness from stroke is much improved. 3. Hypercholesterolemia   4. Addison's disease [facility]) Has follow-up with endocrinology.Good appetite to a [employer] tumor. 5. Morbid obesity 6. Obstructive sleep apnea on CPAP 7. CAD 8. Diastolic CHF 9 chronic back pain/status post lumbar fusion  HPI, Exam, and A&P Transcribed under the direction and in the presence of Richard L. Cranford Mon, MD  Electronically Signed: Webb Laws, CMA I have done the exam and reviewed the above chart and it is accurate to the best of my knowledge.  Miguel Aschoff MD [facility] 04/07/2016 3:23 PM

## 2016-04-07 NOTE — Progress Notes (Signed)
Subjective:  HPI   Prior to Admission medications   Medication Sig Start Date End Date Taking? Authorizing Provider  allopurinol (ZYLOPRIM) 100 MG tablet Take 100 mg by mouth 2 (two) times daily. 11/24/15   Historical Provider, MD  atorvastatin (LIPITOR) 80 MG tablet Take 1 tablet (80 mg total) by mouth daily. 01/23/16   Eloise Levels, MD  carvedilol (COREG) 3.125 MG tablet TAKE 1 TABLET(3.125 MG) BY MOUTH TWICE DAILY [facility] 03/25/16   Vickki Muff Chrismon, PA  ciprofloxacin (CIPRO) 500 MG tablet Take 500 mg by mouth daily with breakfast.    Historical Provider, MD  clopidogrel (PLAVIX) 75 MG tablet Take 1 tablet (75 mg total) by mouth daily. 03/19/16   Venetia Maxon Rama, MD  colchicine 0.6 MG tablet Take 1 tablet (0.6 mg total) by mouth 2 (two) times daily. 12/15/15   Richard Maceo Pro., MD  EPINEPHrine 0.3 mg/0.3 mL IJ SOAJ injection Inject 0.3 mLs (0.3 mg total) into the muscle as needed (allergic reaction). 12/15/15   Richard Maceo Pro., MD  escitalopram (LEXAPRO) 10 MG tablet Take 1 tablet (10 mg total) by mouth daily. 12/15/15   Richard Maceo Pro., MD  furosemide (LASIX) 40 MG tablet Take 1 tablet (40 mg total) by mouth daily. 03/02/16   Richard Maceo Pro., MD  hydrocortisone (CORTEF) 10 MG tablet Take 1 tablet (10 mg total) by mouth 2 (two) times daily. 03/31/16   Elayne Snare, MD  indomethacin (INDOCIN) 50 MG capsule Take 1 capsule (50 mg total) by mouth 2 (two) times daily with a meal. 08/27/15   Richard Maceo Pro., MD  levothyroxine (SYNTHROID, LEVOTHROID) 200 MCG tablet Take 1 tablet (200 mcg total) by mouth daily before breakfast. 12/15/15   Jerrol Banana., MD  losartan (COZAAR) 100 MG tablet Take 100 mg by mouth daily. 03/11/16   Historical Provider, MD  oxyCODONE-acetaminophen (PERCOCET/ROXICET) 5-325 MG tablet Take 1 tablet by mouth every 6 (six) hours as needed.    Historical Provider, MD  polyethylene glycol (MIRALAX / GLYCOLAX) packet Take 17 g by mouth daily as needed for  mild constipation.  08/15/15   Historical Provider, MD  potassium chloride SA (K-DUR,KLOR-CON) 20 MEQ tablet Take 1 tablet (20 mEq total) by mouth daily. 12/15/15   Richard Maceo Pro., MD    Patient Active Problem List   Diagnosis Date Noted  . Adrenal insufficiency ([place]) 03/18/2016  . Vertigo 03/17/2016  . AKI (acute kidney injury) ([place])   . Stroke (cerebrum) ([place])   . HLD (hyperlipidemia)   . Essential hypertension   . Hypotension due to drugs   . Palpitations   . Right sided weakness 01/21/2016  . Acquired hypothyroidism 11/04/2015  . Arthritis 11/04/2015  . Decreased libido 11/04/2015  . Narrowing of intervertebral disc space 11/04/2015  . Major depressive disorder, single episode, mild ([place]) 11/04/2015  . H/o Lyme disease 11/04/2015  . Hypercholesterolemia 11/04/2015  . Hypotestosteronism 11/04/2015  . Morbid obesity ([place]) 11/04/2015  . Temporary cerebral vascular dysfunction 11/04/2015  . Chronic tophaceous gout 09/21/2015  . Blood glucose elevated 06/02/2015  . Benign prostatic hyperplasia with urinary obstruction 09/26/2014  . OSA on CPAP 08/03/2014  . Diastolic CHF ([place]) 99991111  . CAD (coronary artery disease) 08/02/2014  . Bursitis, trochanteric 05/15/2014  . S/P lumbar spinal fusion 07/17/2013  . Chronic back pain     Past Medical History:  Diagnosis Date  . Acute CVA (cerebrovascular accident) ([place]) 01/22/2016  . Arthritis  low back - DDD  . Cancer [facility])    Testicular Cancer  . Cellulitis of scrotum   . Cellulitis, scrotum 08/02/2014  . CHF (congestive heart failure) ([place])   . Chronic lower back pain    "from [place] 2007"  . Complication of anesthesia    Sometimes has N&V /w anesth.   . Coronary artery disease    Cath 2001  . Elevated PSA   . Epididymitis, left 08/04/2014  . History of chronic bronchitis   . History of gout   . Hypertension   . Hypocholesteremia   . Hypothyroidism   . Kidney stone   . Myocardial infarction [facility]) 2001   2001-  cardiac cath., cardiac clearanece note dr Otho Perl 05-14-13 on chart, stress test results 02-21-12 on chart  . OSA on CPAP    cpap setting of 10  . Pneumonia 2000's and 2013  . PONV (postoperative nausea and vomiting)   . [facility] spotted fever   . Stroke [facility]) 2004   "right brain stem; no residual "    Social History   Social History  . Marital status: Married    Spouse name: N/A  . Number of children: 1  . Years of education: N/A   Occupational History  . Retired-works at [employer] History Main Topics  . Smoking status: Never Smoker  . Smokeless tobacco: Never Used  . Alcohol use Yes     Comment: " I drink wine about a year ago" 08/07/15  . Drug use: No  . Sexual activity: No   Other Topics Concern  . Not on file   Social History Narrative  . No narrative on file    Allergies  Allergen Reactions  . Bee Venom Anaphylaxis  . Shrimp [Shellfish Allergy] Anaphylaxis    "just shrimp"  . Stadol [Butorphanol] Anaphylaxis    respiratory  Distress, couldn't breathe, cardiac arrest  . Wasp Venom Anaphylaxis  . Allopurinol Other (See Comments)    500 mg daily causes lethargy, dizziness; tolerates 200 mg daily    ROS  Immunization History  Administered Date(s) Administered  . Pneumococcal Conjugate-13 06/10/2014  . Pneumococcal-Unspecified 03/11/2010  . Tdap 10/01/2009   Objective:  There were no vitals taken for this visit.  Physical Exam  Lab Results  Component Value Date   WBC 6.4 03/17/2016   HGB 16.0 03/17/2016   HCT 47.0 03/17/2016   PLT 186 03/17/2016   GLUCOSE 96 03/31/2016   CHOL 220 (H) 01/22/2016   TRIG 180 (H) 01/22/2016   HDL 35 (L) 01/22/2016   LDLCALC 149 (H) 01/22/2016   TSH 2.08 03/31/2016   INR 1.17 01/25/2016   HGBA1C 5.8 (H) 01/22/2016    CMP     Component Value Date/Time   NA 139 03/31/2016 1505   NA 128 (A) 06/02/2015   NA 139 08/11/2012 1640   K 3.8 03/31/2016 1505   K 3.6 08/11/2012 1640   CL 102 03/31/2016 1505     CL 102 08/11/2012 1640   CO2 33 (H) 03/31/2016 1505   CO2 30 08/11/2012 1640   GLUCOSE 96 03/31/2016 1505   GLUCOSE 85 08/11/2012 1640   BUN 19 03/31/2016 1505   BUN 12 06/02/2015   BUN 14 08/11/2012 1640   CREATININE 1.12 03/31/2016 1505   CREATININE 0.98 08/11/2012 1640   CALCIUM 9.3 03/31/2016 1505   CALCIUM 9.2 08/11/2012 1640   PROT 7.4 03/17/2016 1709   PROT 8.6 (H) 08/11/2012 1640   ALBUMIN  4.4 03/17/2016 1709   ALBUMIN 4.5 08/11/2012 1640   AST 17 03/17/2016 1709   AST 20 08/11/2012 1640   ALT 18 03/17/2016 1709   ALT 25 08/11/2012 1640   ALKPHOS 53 03/17/2016 1709   ALKPHOS 91 08/11/2012 1640   BILITOT 1.4 (H) 03/17/2016 1709   BILITOT 1.3 (H) 08/11/2012 1640   GFRNONAA >60 03/17/2016 1709   GFRNONAA >60 08/11/2012 1640   GFRAA >60 03/17/2016 1709   GFRAA >60 08/11/2012 1640    Assessment and Plan :    Miguel Aschoff MD [facility] 04/07/2016 2:56 PM

## 2016-04-13 ENCOUNTER — Ambulatory Visit: Payer: Medicare Other | Admitting: Cardiovascular Disease

## 2016-04-17 LAB — CUP PACEART REMOTE DEVICE CHECK: Date Time Interrogation Session: 20170917193538

## 2016-04-17 NOTE — Progress Notes (Signed)
Carelink summary report received. Battery status OK. Normal device function. No new symptom episodes, tachy episodes, brady, or pause episodes. No new AF episodes. Monthly summary reports and ROV/PRN 

## 2016-04-18 ENCOUNTER — Encounter: Payer: Self-pay | Admitting: Neurology

## 2016-04-18 ENCOUNTER — Ambulatory Visit (INDEPENDENT_AMBULATORY_CARE_PROVIDER_SITE_OTHER): Payer: Medicare Other | Admitting: Neurology

## 2016-04-18 VITALS — BP 154/99 | HR 73 | Ht 79.0 in | Wt 380.4 lb

## 2016-04-18 DIAGNOSIS — I639 Cerebral infarction, unspecified: Secondary | ICD-10-CM | POA: Diagnosis not present

## 2016-04-18 NOTE — Progress Notes (Signed)
[facility] [address] [place]. [place] [address] [phone]       OFFICE FOLLOW-UP NOTE  Mr. Tucker Kintigh Matich Sr. Date of Birth:  1945/08/05 Medical Record Number:  [id number]   HPI: 41 year obese Caucasian male seen today for the first office follow-up visit following hospital admission for stroke in July 2017. The patient presented on 01/21/16 with sudden onset of right arm and leg weakness with the low end at stroke scale of 2. He was not considered a candidate for TPA. MRI scan of the brain was obtained which showed patchy feel right MCA as well as left MCA infarcts felt to be embolic etiology. Transthoracic echo was unremarkable. Carotid ultrasound showed no significant extra can stenosis. Irregular enhancement made for outpatient TEE and loop recorder. Patient presented 3 days later with worsening of symptoms and to. MRI scan showed multiple acute and subacute left hemispheric infarct compared with the previous MRI scan from 01/21/16 and MRI on 01/25/16 showed increase in size of left caudate infarct and new infarct in the anterior lateral margin of the left lentiform nucleus New Infarct in the left motor strip. The remaining infarcts unchanged. TEE showed no cardiac source of embolism and patient had loop recorder inserted. LDL cholesterol elevated at 149 mg percent and hemoglobin A1c was 5.8. Patient was started on Plavix for stroke prevention and advise aggressive risk factor modification. He was discharged home and states his done well and made full physical recovery from his stroke and has no deficit to his stamina is known to see him at times easily. The patient however has had new medical problems since discharge and has been found to have multiple endocrine dysfunction and sees Dr. Elayne Snare endocrinologist. He plans on getting a pituitary scan next week. He has been started on steroids and has skin 30-35 pounds weight. His tolerate Lipitor 80 mg without side effects.  Patient is also pulled a muscle in the last 1 week and is bothered by back pain. His blood pressure is elevated today at 154/99 the usually does much better.  ROS:   14 system review of systems is positive for  weight change, cold intolerance, swollen abdomen, incontinence of bladder, food allergies, tremors, back pain, apnea, rash and all other systems negative  PMH:  Past Medical History:  Diagnosis Date  . Acute CVA (cerebrovascular accident) ([place]) 01/22/2016  . Arthritis    low back - DDD  . Cancer [facility])    Testicular Cancer  . Cellulitis of scrotum   . Cellulitis, scrotum 08/02/2014  . CHF (congestive heart failure) ([place])   . Chronic lower back pain    "from [place] 2007"  . Complication of anesthesia    Sometimes has N&V /w anesth.   . Coronary artery disease    Cath 2001  . Elevated PSA   . Epididymitis, left 08/04/2014  . History of chronic bronchitis   . History of gout   . Hypertension   . Hypocholesteremia   . Hypothyroidism   . Kidney stone   . Myocardial infarction 2001   2001- cardiac cath., cardiac clearanece note dr Otho Perl 05-14-13 on chart, stress test results 02-21-12 on chart  . OSA on CPAP    cpap setting of 10  . Pneumonia 2000's and 2013  . PONV (postoperative nausea and vomiting)   . [facility] spotted fever   . Stroke [facility]) 2004   "right brain stem; no residual "    Social History:  Social History  Social History  . Marital status: Married    Spouse name: N/A  . Number of children: 1  . Years of education: N/A   Occupational History  . Retired-works at [employer] History Main Topics  . Smoking status: Never Smoker  . Smokeless tobacco: Never Used  . Alcohol use Yes     Comment: " I drink wine about a year ago" 08/07/15  . Drug use: No  . Sexual activity: No   Other Topics Concern  . Not on file   Social History Narrative  . No narrative on file    Medications:   Current Outpatient Prescriptions on File Prior to Visit    Medication Sig Dispense Refill  . allopurinol (ZYLOPRIM) 100 MG tablet Take 1 tablet (100 mg total) by mouth 2 (two) times daily. 30 tablet 12  . atorvastatin (LIPITOR) 80 MG tablet Take 1 tablet (80 mg total) by mouth daily. 30 tablet 12  . carvedilol (COREG) 3.125 MG tablet Take 1 tablet (3.125 mg total) by mouth 2 (two) times daily with a meal. 60 tablet 12  . ciprofloxacin (CIPRO) 500 MG tablet Take 1 tablet (500 mg total) by mouth daily with breakfast. 30 tablet 12  . clopidogrel (PLAVIX) 75 MG tablet Take 1 tablet (75 mg total) by mouth daily. 30 tablet 12  . colchicine 0.6 MG tablet Take 1 tablet (0.6 mg total) by mouth 2 (two) times daily. 60 tablet 12  . EPINEPHrine 0.3 mg/0.3 mL IJ SOAJ injection Inject 0.3 mLs (0.3 mg total) into the muscle as needed (allergic reaction). 1 Device 12  . escitalopram (LEXAPRO) 10 MG tablet Take 1 tablet (10 mg total) by mouth daily. 30 tablet 11  . furosemide (LASIX) 40 MG tablet Take 1 tablet (40 mg total) by mouth daily. 30 tablet 12  . hydrocortisone (CORTEF) 10 MG tablet Take 1 tablet (10 mg total) by mouth 2 (two) times daily. 60 tablet 12  . indomethacin (INDOCIN) 50 MG capsule Take 1 capsule (50 mg total) by mouth 2 (two) times daily with a meal. 60 capsule 12  . levothyroxine (SYNTHROID, LEVOTHROID) 200 MCG tablet Take 1 tablet (200 mcg total) by mouth daily before breakfast. 30 tablet 11  . losartan (COZAAR) 100 MG tablet Take 1 tablet (100 mg total) by mouth daily. 30 tablet 12  . oxyCODONE-acetaminophen (PERCOCET/ROXICET) 5-325 MG tablet Take 1 tablet by mouth every 6 (six) hours as needed.    . polyethylene glycol (MIRALAX / GLYCOLAX) packet Take 17 g by mouth daily as needed for mild constipation.     . potassium chloride SA (K-DUR,KLOR-CON) 20 MEQ tablet Take 1 tablet (20 mEq total) by mouth daily. 30 tablet 12   No current facility-administered medications on file prior to visit.     Allergies:   Allergies  Allergen Reactions  . Bee  Venom Anaphylaxis  . Shrimp [Shellfish Allergy] Anaphylaxis    "just shrimp"  . Stadol [Butorphanol] Anaphylaxis    respiratory  Distress, couldn't breathe, cardiac arrest  . Wasp Venom Anaphylaxis  . Allopurinol Other (See Comments)    500 mg daily causes lethargy, dizziness; tolerates 200 mg daily    Physical Exam General:obese middle aged caucasian male seated, in no evident distress Head: head normocephalic and atraumatic.  Neck: supple with no carotid or supraclavicular bruits Cardiovascular: regular rate and rhythm, no murmurs Musculoskeletal: no deformity Skin:  no rash/petichiae Vascular:  Normal pulses all extremities Vitals:   04/18/16 1322  BP: Marland Kitchen)  154/99  Pulse: 73   Neurologic Exam Mental Status: Awake and fully alert. Oriented to place and time. Recent and remote memory intact. Attention span, concentration and fund of knowledge appropriate. Mood and affect appropriate.  Cranial Nerves: Fundoscopic exam reveals sharp disc margins. Pupils equal, briskly reactive to light. Extraocular movements full without nystagmus. Visual fields full to confrontation. Hearing intact. Facial sensation intact. Face, tongue, palate moves normally and symmetrically.  Motor: Normal bulk and tone. Normal strength in all tested extremity muscles. Sensory.: intact to touch ,pinprick .position and vibratory sensation.  Coordination: Rapid alternating movements normal in all extremities. Finger-to-nose and heel-to-shin performed accurately bilaterally. Gait and Station: Arises from chair with  difficulty. Stance is slightly stooped due to back painl. Gait demonstrates broad base with splinting of back . Unable to heel, toe and tandem walk   Reflexes: 1+ and symmetric. Toes downgoing.   NIHSS  0 Modified Rankin  2   ASSESSMENT: 42 year Caucasian male with the multiple embolic infarcts in July 2017 of cryptogenic etiology with multiple vascular risk factors of intracranial atherosclerosis,  obesity, hypertension, hyperlipidemia    PLAN: I had a long d/w patient and his son about his recent stroke, risk for recurrent stroke/TIAs, personally independently reviewed imaging studies and stroke evaluation results and answered questions.Continue Plavix 75 mg daily  for secondary stroke prevention and maintain strict control of hypertension with blood pressure goal below 130/90, diabetes with hemoglobin A1c goal below 6.5% and lipids with LDL cholesterol goal below 70 mg/dL. I also advised the patient to eat a healthy diet with plenty of whole grains, cereals, fruits and vegetables, exercise regularly and maintain ideal body weight .he was advised to follow-up with Dr. Ronnald Ramp for his exacerbation of back pain and with Dr. Elayne Snare for his endocrine problems Followup in the future with me in  1 year or call earlier if necessary Greater than 50% of time during this 25 minute visit was spent on counseling,explanation of diagnosis, planning of further management, discussion with patient and family and coordination of care Antony Contras, MD  [facility] [address] [place], [place] [address]  Phone [phone] Fax [phone] Note: This document was prepared with digital dictation and possible smart phrase technology. Any transcriptional errors that result from this process are unintentional

## 2016-04-18 NOTE — Patient Instructions (Signed)
I had a long d/w patient and his son about his recent stroke, risk for recurrent stroke/TIAs, personally independently reviewed imaging studies and stroke evaluation results and answered questions.Continue Plavix 75 mg daily  for secondary stroke prevention and maintain strict control of hypertension with blood pressure goal below 130/90, diabetes with hemoglobin A1c goal below 6.5% and lipids with LDL cholesterol goal below 70 mg/dL. I also advised the patient to eat a healthy diet with plenty of whole grains, cereals, fruits and vegetables, exercise regularly and maintain ideal body weight .he was advised to follow-up with Dr. Ronnald Ramp for his exacerbation of back pain and with Dr. Elayne Snare for his endocrine problems Followup in the future with me in  1 year or call earlier if necessary  Stroke Prevention Some medical conditions and behaviors are associated with an increased chance of having a stroke. You may prevent a stroke by making healthy choices and managing medical conditions. HOW CAN I REDUCE MY RISK OF HAVING A STROKE?   Stay physically active. Get at least 30 minutes of activity on most or all days.  Do not smoke. It may also be helpful to avoid exposure to secondhand smoke.  Limit alcohol use. Moderate alcohol use is considered to be:  No more than 2 drinks per day for men.  No more than 1 drink per day for nonpregnant women.  Eat healthy foods. This involves:  Eating 5 or more servings of fruits and vegetables a day.  Making dietary changes that address high blood pressure (hypertension), high cholesterol, diabetes, or obesity.  Manage your cholesterol levels.  Making food choices that are high in fiber and low in saturated fat, trans fat, and cholesterol may control cholesterol levels.  Take any prescribed medicines to control cholesterol as directed by your health care provider.  Manage your diabetes.  Controlling your carbohydrate and sugar intake is recommended to  manage diabetes.  Take any prescribed medicines to control diabetes as directed by your health care provider.  Control your hypertension.  Making food choices that are low in salt (sodium), saturated fat, trans fat, and cholesterol is recommended to manage hypertension.  Ask your health care provider if you need treatment to lower your blood pressure. Take any prescribed medicines to control hypertension as directed by your health care provider.  If you are 76-65 years of age, have your blood pressure checked every 3-5 years. If you are 33 years of age or older, have your blood pressure checked every year.  Maintain a healthy weight.  Reducing calorie intake and making food choices that are low in sodium, saturated fat, trans fat, and cholesterol are recommended to manage weight.  Stop drug abuse.  Avoid taking birth control pills.  Talk to your health care provider about the risks of taking birth control pills if you are over 45 years old, smoke, get migraines, or have ever had a blood clot.  Get evaluated for sleep disorders (sleep apnea).  Talk to your health care provider about getting a sleep evaluation if you snore a lot or have excessive sleepiness.  Take medicines only as directed by your health care provider.  For some people, aspirin or blood thinners (anticoagulants) are helpful in reducing the risk of forming abnormal blood clots that can lead to stroke. If you have the irregular heart rhythm of atrial fibrillation, you should be on a blood thinner unless there is a good reason you cannot take them.  Understand all your medicine instructions.  Make sure  that other conditions (such as anemia or atherosclerosis) are addressed. SEEK IMMEDIATE MEDICAL CARE IF:   You have sudden weakness or numbness of the face, arm, or leg, especially on one side of the body.  Your face or eyelid droops to one side.  You have sudden confusion.  You have trouble speaking (aphasia) or  understanding.  You have sudden trouble seeing in one or both eyes.  You have sudden trouble walking.  You have dizziness.  You have a loss of balance or coordination.  You have a sudden, severe headache with no known cause.  You have new chest pain or an irregular heartbeat. Any of these symptoms may represent a serious problem that is an emergency. Do not wait to see if the symptoms will go away. Get medical help at once. Call your local emergency services (911 in [place] Do not drive yourself to the hospital.   This information is not intended to replace advice given to you by your health care provider. Make sure you discuss any questions you have with your health care provider.   Document Released: 08/04/2004 Document Revised: 07/18/2014 Document Reviewed: 12/28/2012 Elsevier Interactive Patient Education [employer].

## 2016-04-20 ENCOUNTER — Ambulatory Visit
Admission: RE | Admit: 2016-04-20 | Discharge: 2016-04-20 | Disposition: A | Discharge status: Home / Self Care | Payer: Medicare Other | Source: Ambulatory Visit | Attending: Endocrinology | Admitting: Endocrinology

## 2016-04-20 DIAGNOSIS — R251 Tremor, unspecified: Secondary | ICD-10-CM | POA: Diagnosis not present

## 2016-04-20 DIAGNOSIS — E230 Hypopituitarism: Secondary | ICD-10-CM

## 2016-04-20 MED ORDER — GADOBENATE DIMEGLUMINE 529 MG/ML IV SOLN
10.0000 mL | Freq: Once | INTRAVENOUS | Status: AC | PRN
  Administered 2016-04-20: 10 mL via INTRAVENOUS

## 2016-04-25 ENCOUNTER — Telehealth: Payer: Self-pay | Admitting: Endocrinology

## 2016-04-25 NOTE — Telephone Encounter (Signed)
Patient is calling for the result of his MRI.  Please advise

## 2016-04-25 NOTE — Telephone Encounter (Signed)
It is showing a little fluid in the cavity continuing the pituitary gland but normal pituitary gland.  No treatment is needed but this condition is associated with reduced pituitary function

## 2016-04-25 NOTE — Telephone Encounter (Signed)
Please see below and advise.

## 2016-04-26 ENCOUNTER — Ambulatory Visit (INDEPENDENT_AMBULATORY_CARE_PROVIDER_SITE_OTHER): Payer: Medicare Other | Admitting: *Deleted

## 2016-04-26 DIAGNOSIS — I639 Cerebral infarction, unspecified: Secondary | ICD-10-CM | POA: Diagnosis not present

## 2016-04-26 NOTE — Telephone Encounter (Signed)
Yes he can live a normal life with taking appropriate doses of supplements

## 2016-04-26 NOTE — Telephone Encounter (Signed)
Patient wants to know since his thyroid is shot, and adrenal glands are shot, he wants to know what to do long term, does he just live with this?

## 2016-04-26 NOTE — Telephone Encounter (Signed)
Noted, patient is aware and does have a f/u appt scheduled.

## 2016-04-27 NOTE — Progress Notes (Signed)
Carelink Summary Report / Loop Recorder 

## 2016-04-30 ENCOUNTER — Telehealth: Payer: Self-pay | Admitting: Endocrinology

## 2016-04-30 ENCOUNTER — Other Ambulatory Visit: Payer: Self-pay | Admitting: Endocrinology

## 2016-04-30 NOTE — Telephone Encounter (Signed)
He is scheduled for labs on 10/30 and follow-up on 11/2. Currently not on any treatment for very low testosterone Would like to start him on AndroGel 1%, one pump on each arm Unless he is not doing well would like to postpone his follow-up by a month.

## 2016-05-02 ENCOUNTER — Other Ambulatory Visit: Payer: Self-pay | Admitting: Endocrinology

## 2016-05-02 NOTE — Telephone Encounter (Signed)
Sent prescription for Androderm 4 mg daily.  Is not covered he can try Fortesta gel 2 pumps on each thigh daily.

## 2016-05-02 NOTE — Telephone Encounter (Signed)
Patient said he has been on the androgel for quite a while in the past and they stopped it because it did not work for him, he said if you have anything else in mind he could try that, but the gel was very sticky and "yucky"

## 2016-05-03 ENCOUNTER — Other Ambulatory Visit: Payer: Self-pay

## 2016-05-03 MED ORDER — TESTOSTERONE 4 MG/24HR TD PT24: 1.0000 | MEDICATED_PATCH | Freq: Every day | TRANSDERMAL | 1 refills | Status: DC

## 2016-05-03 NOTE — Telephone Encounter (Signed)
Patient is calling on the status of what alternative medication he could take besides the Androgel, it didn't work for him. Please advise

## 2016-05-04 ENCOUNTER — Telehealth: Payer: Self-pay | Admitting: Neurology

## 2016-05-04 ENCOUNTER — Other Ambulatory Visit: Payer: Self-pay

## 2016-05-04 NOTE — Telephone Encounter (Signed)
Called patient and spoke with him, he is going to try the Androderm patch for now and will let us know if that works for him.

## 2016-05-04 NOTE — Telephone Encounter (Signed)
Mr. Washabaugh returned your call please call him back at [phone] thanks [place]

## 2016-05-05 ENCOUNTER — Telehealth: Payer: Self-pay | Admitting: Endocrinology

## 2016-05-05 NOTE — Telephone Encounter (Signed)
Pt called and said that he needed to talk to you regarding the conversation you had earlier.

## 2016-05-06 NOTE — Telephone Encounter (Signed)
Spoke with patient and he will be rescheduling appointment for 30 days out to recheck testosterone levels

## 2016-05-09 ENCOUNTER — Other Ambulatory Visit: Payer: Medicare Other

## 2016-05-10 DIAGNOSIS — I10 Essential (primary) hypertension: Secondary | ICD-10-CM | POA: Diagnosis not present

## 2016-05-10 DIAGNOSIS — M5440 Lumbago with sciatica, unspecified side: Secondary | ICD-10-CM | POA: Diagnosis not present

## 2016-05-10 DIAGNOSIS — Z6841 Body Mass Index (BMI) 40.0 and over, adult: Secondary | ICD-10-CM | POA: Diagnosis not present

## 2016-05-12 ENCOUNTER — Ambulatory Visit: Payer: Medicare Other | Admitting: Endocrinology

## 2016-05-13 ENCOUNTER — Other Ambulatory Visit: Payer: Self-pay | Admitting: Neurological Surgery

## 2016-05-13 ENCOUNTER — Ambulatory Visit
Admission: RE | Admit: 2016-05-13 | Discharge: 2016-05-13 | Disposition: A | Discharge status: Home / Self Care | Payer: Medicare Other | Source: Ambulatory Visit | Attending: Neurological Surgery | Admitting: Neurological Surgery

## 2016-05-13 DIAGNOSIS — M5440 Lumbago with sciatica, unspecified side: Secondary | ICD-10-CM

## 2016-05-13 DIAGNOSIS — M5126 Other intervertebral disc displacement, lumbar region: Secondary | ICD-10-CM | POA: Diagnosis not present

## 2016-05-17 ENCOUNTER — Other Ambulatory Visit: Payer: Self-pay | Admitting: Neurological Surgery

## 2016-05-17 DIAGNOSIS — M5126 Other intervertebral disc displacement, lumbar region: Secondary | ICD-10-CM

## 2016-05-23 ENCOUNTER — Ambulatory Visit
Admission: RE | Admit: 2016-05-23 | Discharge: 2016-05-23 | Disposition: A | Discharge status: Home / Self Care | Payer: Medicare Other | Source: Ambulatory Visit | Attending: Neurological Surgery | Admitting: Neurological Surgery

## 2016-05-23 ENCOUNTER — Other Ambulatory Visit: Payer: Self-pay | Admitting: Neurological Surgery

## 2016-05-23 DIAGNOSIS — M5126 Other intervertebral disc displacement, lumbar region: Secondary | ICD-10-CM | POA: Diagnosis not present

## 2016-05-23 MED ORDER — IOPAMIDOL (ISOVUE-M 200) INJECTION 41%
1.0000 mL | Freq: Once | INTRAMUSCULAR | Status: AC
  Administered 2016-05-23: 1 mL via EPIDURAL

## 2016-05-23 MED ORDER — METHYLPREDNISOLONE ACETATE 40 MG/ML INJ SUSP (RADIOLOG
120.0000 mg | Freq: Once | INTRAMUSCULAR | Status: AC
  Administered 2016-05-23: 120 mg via EPIDURAL

## 2016-05-23 NOTE — Discharge Instructions (Signed)

## 2016-05-26 ENCOUNTER — Ambulatory Visit (INDEPENDENT_AMBULATORY_CARE_PROVIDER_SITE_OTHER): Payer: Medicare Other | Admitting: *Deleted

## 2016-05-26 DIAGNOSIS — I639 Cerebral infarction, unspecified: Secondary | ICD-10-CM | POA: Diagnosis not present

## 2016-05-27 NOTE — Progress Notes (Signed)
Carelink Summary Report / Loop Recorder 

## 2016-05-28 LAB — CUP PACEART REMOTE DEVICE CHECK
Date Time Interrogation Session: 20171017204149
Implantable Pulse Generator Implant Date: 20170719

## 2016-05-28 NOTE — Progress Notes (Signed)
Carelink summary report received. Battery status OK. Normal device function. No new symptom episodes, tachy episodes, brady, or pause episodes. No new AF episodes. Monthly summary reports and ROV/PRN 

## 2016-05-31 DIAGNOSIS — N400 Enlarged prostate without lower urinary tract symptoms: Secondary | ICD-10-CM | POA: Diagnosis not present

## 2016-06-06 ENCOUNTER — Other Ambulatory Visit: Payer: Self-pay | Admitting: Neurological Surgery

## 2016-06-06 ENCOUNTER — Other Ambulatory Visit: Payer: Self-pay | Admitting: Endocrinology

## 2016-06-06 ENCOUNTER — Other Ambulatory Visit (INDEPENDENT_AMBULATORY_CARE_PROVIDER_SITE_OTHER): Payer: Medicare Other

## 2016-06-06 DIAGNOSIS — E221 Hyperprolactinemia: Secondary | ICD-10-CM | POA: Diagnosis not present

## 2016-06-06 DIAGNOSIS — E2740 Unspecified adrenocortical insufficiency: Secondary | ICD-10-CM | POA: Diagnosis not present

## 2016-06-06 DIAGNOSIS — E038 Other specified hypothyroidism: Secondary | ICD-10-CM

## 2016-06-06 DIAGNOSIS — M5126 Other intervertebral disc displacement, lumbar region: Secondary | ICD-10-CM

## 2016-06-06 DIAGNOSIS — E291 Testicular hypofunction: Secondary | ICD-10-CM

## 2016-06-06 LAB — BASIC METABOLIC PANEL
BUN: 21 mg/dL (ref 6–23)
CO2: 30 mEq/L (ref 19–32)
Calcium: 9.4 mg/dL (ref 8.4–10.5)
Chloride: 102 mEq/L (ref 96–112)
Creatinine, Ser: 1.1 mg/dL (ref 0.40–1.50)
GFR: 70.2 mL/min (ref >60.00)
Glucose, Bld: 112 mg/dL — ABNORMAL HIGH (ref 70–99)
Potassium: 3.8 mEq/L (ref 3.5–5.1)
Sodium: 138 mEq/L (ref 135–145)

## 2016-06-06 LAB — TSH: TSH: 0.14 u[IU]/mL — ABNORMAL LOW (ref 0.35–4.50)

## 2016-06-06 LAB — T4, FREE: Free T4: 1.21 ng/dL (ref 0.60–1.60)

## 2016-06-07 DIAGNOSIS — R319 Hematuria, unspecified: Secondary | ICD-10-CM | POA: Diagnosis not present

## 2016-06-07 DIAGNOSIS — N390 Urinary tract infection, site not specified: Secondary | ICD-10-CM | POA: Diagnosis not present

## 2016-06-07 LAB — TESTOSTERONE, FREE, TOTAL, SHBG
Sex Hormone Binding: 45.5 nmol/L (ref 19.3–76.4)
Testosterone, Free: 1.9 pg/mL — ABNORMAL LOW (ref 6.6–18.1)
Testosterone: 194 ng/dL — ABNORMAL LOW (ref 264–916)

## 2016-06-07 LAB — PROLACTIN: Prolactin: 18 ng/mL — ABNORMAL HIGH (ref 4.0–15.2)

## 2016-06-09 ENCOUNTER — Other Ambulatory Visit: Payer: Self-pay

## 2016-06-09 ENCOUNTER — Ambulatory Visit (INDEPENDENT_AMBULATORY_CARE_PROVIDER_SITE_OTHER): Payer: Medicare Other | Admitting: Endocrinology

## 2016-06-09 ENCOUNTER — Telehealth: Payer: Self-pay | Admitting: Endocrinology

## 2016-06-09 ENCOUNTER — Encounter: Payer: Self-pay | Admitting: Endocrinology

## 2016-06-09 VITALS — BP 150/90 | HR 67 | Wt 390.0 lb

## 2016-06-09 DIAGNOSIS — I639 Cerebral infarction, unspecified: Secondary | ICD-10-CM | POA: Diagnosis not present

## 2016-06-09 DIAGNOSIS — E230 Hypopituitarism: Secondary | ICD-10-CM | POA: Diagnosis not present

## 2016-06-09 DIAGNOSIS — E039 Hypothyroidism, unspecified: Secondary | ICD-10-CM

## 2016-06-09 DIAGNOSIS — E2749 Other adrenocortical insufficiency: Secondary | ICD-10-CM

## 2016-06-09 MED ORDER — CLOMIPHENE CITRATE 50 MG PO TABS: ORAL_TABLET | ORAL | 3 refills | Status: DC

## 2016-06-09 MED ORDER — TESTOSTERONE 4 MG/24HR TD PT24: 1.0000 | MEDICATED_PATCH | Freq: Every day | TRANSDERMAL | 3 refills | Status: DC

## 2016-06-09 MED ORDER — TESTOSTERONE 4 MG/24HR TD PT24: 1.0000 | MEDICATED_PATCH | Freq: Every day | TRANSDERMAL | 1 refills | Status: DC

## 2016-06-09 MED ORDER — LEVOTHYROXINE SODIUM 175 MCG PO TABS: 175.0000 ug | ORAL_TABLET | Freq: Every day | ORAL | 3 refills | Status: DC

## 2016-06-09 NOTE — Telephone Encounter (Signed)
Patient stated that his hands arms and body has been shaking bad, and quite sure why. Please advise
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
## 2016-06-10 NOTE — Telephone Encounter (Signed)
I had called the patient to discuss this yesterday and told him to talk to his PCP, not related to endocrinology problems

## 2016-06-13 DIAGNOSIS — M5126 Other intervertebral disc displacement, lumbar region: Secondary | ICD-10-CM | POA: Diagnosis not present

## 2016-06-14 ENCOUNTER — Other Ambulatory Visit: Payer: Self-pay | Admitting: Neurological Surgery

## 2016-06-14 ENCOUNTER — Ambulatory Visit
Admission: RE | Admit: 2016-06-14 | Discharge: 2016-06-14 | Disposition: A | Discharge status: Home / Self Care | Payer: Medicare Other | Source: Ambulatory Visit | Attending: Neurological Surgery | Admitting: Neurological Surgery

## 2016-06-14 DIAGNOSIS — M5126 Other intervertebral disc displacement, lumbar region: Secondary | ICD-10-CM

## 2016-06-14 DIAGNOSIS — M47817 Spondylosis without myelopathy or radiculopathy, lumbosacral region: Secondary | ICD-10-CM | POA: Diagnosis not present

## 2016-06-14 MED ORDER — METHYLPREDNISOLONE ACETATE 40 MG/ML INJ SUSP (RADIOLOG
120.0000 mg | Freq: Once | INTRAMUSCULAR | Status: AC
  Administered 2016-06-14: 120 mg via EPIDURAL

## 2016-06-14 MED ORDER — IOPAMIDOL (ISOVUE-M 200) INJECTION 41%
1.0000 mL | Freq: Once | INTRAMUSCULAR | Status: AC
  Administered 2016-06-14: 1 mL via EPIDURAL

## 2016-06-14 NOTE — Discharge Instructions (Signed)
Post Procedure Spinal Discharge Instruction Sheet  1. You may resume a regular diet and any medications that you routinely take (including pain medications).  2. No driving day of procedure.  3. Light activity throughout the rest of the day.  Do not do any strenuous work, exercise, bending or lifting.  The day following the procedure, you can resume normal physical activity but you should refrain from exercising or physical therapy for at least three days thereafter.   Common Side Effects:   Headaches- take your usual medications as directed by your physician.  Increase your fluid intake.  Caffeinated beverages may be helpful.  Lie flat in bed until your headache resolves.   Restlessness or inability to sleep- you may have trouble sleeping for the next few days.  Ask your referring physician if you need any medication for sleep.   Facial flushing or redness- should subside within a few days.   Increased pain- a temporary increase in pain a day or two following your procedure is not unusual.  Take your pain medication as prescribed by your referring physician.   Leg cramps  Please contact our office at [phone] for the following symptoms:  Fever greater than 100 degrees.  Headaches unresolved with medication after 2-3 days.  Increased swelling, pain, or redness at injection site.  Thank you for visiting our office.  You may resume Plavix today.   

## 2016-06-24 ENCOUNTER — Ambulatory Visit (INDEPENDENT_AMBULATORY_CARE_PROVIDER_SITE_OTHER): Payer: Medicare Other | Admitting: *Deleted

## 2016-06-24 ENCOUNTER — Telehealth: Payer: Self-pay | Admitting: Family Medicine

## 2016-06-24 DIAGNOSIS — I639 Cerebral infarction, unspecified: Secondary | ICD-10-CM

## 2016-06-24 NOTE — Telephone Encounter (Signed)
Called Pt to schedule AWV with [facility] 2pm on 3/28 move f/up visit with Dr. Rosanna Randy to 3pm  - knb

## 2016-06-27 NOTE — Progress Notes (Signed)
Carelink Summary Report / Loop Recorder 

## 2016-07-07 LAB — CUP PACEART REMOTE DEVICE CHECK
Date Time Interrogation Session: 20171116213801
Implantable Pulse Generator Implant Date: 20170719

## 2016-07-12 DIAGNOSIS — M5126 Other intervertebral disc displacement, lumbar region: Secondary | ICD-10-CM | POA: Diagnosis not present

## 2016-07-25 ENCOUNTER — Ambulatory Visit (INDEPENDENT_AMBULATORY_CARE_PROVIDER_SITE_OTHER): Payer: Medicare Other | Admitting: *Deleted

## 2016-07-25 DIAGNOSIS — I639 Cerebral infarction, unspecified: Secondary | ICD-10-CM | POA: Diagnosis not present

## 2016-07-26 NOTE — Progress Notes (Signed)
carelink summary report 

## 2016-08-05 ENCOUNTER — Other Ambulatory Visit: Payer: Medicare Other

## 2016-08-05 ENCOUNTER — Other Ambulatory Visit: Payer: Self-pay | Admitting: Internal Medicine

## 2016-08-09 ENCOUNTER — Ambulatory Visit: Payer: Medicare Other | Admitting: Endocrinology

## 2016-08-14 LAB — CUP PACEART REMOTE DEVICE CHECK
Date Time Interrogation Session: 20171216214323
Implantable Pulse Generator Implant Date: 20170719

## 2016-08-14 NOTE — Progress Notes (Signed)
Carelink summary report received. Battery status OK. Normal device function. No new symptom episodes, tachy episodes, brady, or pause episodes. No new AF episodes. Monthly summary reports and ROV/PRN 

## 2016-08-16 ENCOUNTER — Telehealth: Payer: Self-pay | Admitting: *Deleted

## 2016-08-16 NOTE — Telephone Encounter (Signed)
Spoke with patient to schedule appointment for [facility] reprogramming.  Per [name], need to increase tachy detection rate to 170bpm as ILR is detecting sinus tach episodes in the 160s.  Patient is agreeable to appointment at [facility] on 09/06/16 at 3:30pm.  Patient given directions to office.  He verbalizes understanding of all instructions and denies additional questions or concerns at this time.

## 2016-08-23 ENCOUNTER — Inpatient Hospital Stay (HOSPITAL_COMMUNITY)
Admission: AD | Admit: 2016-08-23 | Discharge: 2016-09-03 | DRG: 460 | Disposition: A | Discharge status: Home / Self Care | Payer: Medicare Other | Source: Ambulatory Visit | Attending: Neurological Surgery | Admitting: Neurological Surgery

## 2016-08-23 ENCOUNTER — Encounter (HOSPITAL_COMMUNITY): Payer: Self-pay | Admitting: Neurological Surgery

## 2016-08-23 DIAGNOSIS — I2510 Atherosclerotic heart disease of native coronary artery without angina pectoris: Secondary | ICD-10-CM | POA: Diagnosis present

## 2016-08-23 DIAGNOSIS — M549 Dorsalgia, unspecified: Secondary | ICD-10-CM | POA: Diagnosis present

## 2016-08-23 DIAGNOSIS — R404 Transient alteration of awareness: Secondary | ICD-10-CM | POA: Diagnosis not present

## 2016-08-23 DIAGNOSIS — Z8547 Personal history of malignant neoplasm of testis: Secondary | ICD-10-CM

## 2016-08-23 DIAGNOSIS — M5489 Other dorsalgia: Secondary | ICD-10-CM | POA: Diagnosis not present

## 2016-08-23 DIAGNOSIS — Z91030 Bee allergy status: Secondary | ICD-10-CM

## 2016-08-23 DIAGNOSIS — M5126 Other intervertebral disc displacement, lumbar region: Secondary | ICD-10-CM | POA: Diagnosis present

## 2016-08-23 DIAGNOSIS — Z96651 Presence of right artificial knee joint: Secondary | ICD-10-CM | POA: Diagnosis present

## 2016-08-23 DIAGNOSIS — Z823 Family history of stroke: Secondary | ICD-10-CM

## 2016-08-23 DIAGNOSIS — Z8249 Family history of ischemic heart disease and other diseases of the circulatory system: Secondary | ICD-10-CM

## 2016-08-23 DIAGNOSIS — Z8673 Personal history of transient ischemic attack (TIA), and cerebral infarction without residual deficits: Secondary | ICD-10-CM

## 2016-08-23 DIAGNOSIS — Z79899 Other long term (current) drug therapy: Secondary | ICD-10-CM

## 2016-08-23 DIAGNOSIS — R531 Weakness: Secondary | ICD-10-CM | POA: Diagnosis not present

## 2016-08-23 DIAGNOSIS — M5440 Lumbago with sciatica, unspecified side: Secondary | ICD-10-CM | POA: Diagnosis not present

## 2016-08-23 DIAGNOSIS — M48061 Spinal stenosis, lumbar region without neurogenic claudication: Principal | ICD-10-CM | POA: Diagnosis present

## 2016-08-23 DIAGNOSIS — I110 Hypertensive heart disease with heart failure: Secondary | ICD-10-CM | POA: Diagnosis present

## 2016-08-23 DIAGNOSIS — Z7902 Long term (current) use of antithrombotics/antiplatelets: Secondary | ICD-10-CM

## 2016-08-23 DIAGNOSIS — Z888 Allergy status to other drugs, medicaments and biological substances status: Secondary | ICD-10-CM

## 2016-08-23 DIAGNOSIS — Z419 Encounter for procedure for purposes other than remedying health state, unspecified: Secondary | ICD-10-CM

## 2016-08-23 DIAGNOSIS — Z833 Family history of diabetes mellitus: Secondary | ICD-10-CM

## 2016-08-23 DIAGNOSIS — I509 Heart failure, unspecified: Secondary | ICD-10-CM | POA: Diagnosis present

## 2016-08-23 DIAGNOSIS — M109 Gout, unspecified: Secondary | ICD-10-CM | POA: Diagnosis present

## 2016-08-23 DIAGNOSIS — Z981 Arthrodesis status: Secondary | ICD-10-CM

## 2016-08-23 DIAGNOSIS — G4733 Obstructive sleep apnea (adult) (pediatric): Secondary | ICD-10-CM | POA: Diagnosis present

## 2016-08-23 DIAGNOSIS — E039 Hypothyroidism, unspecified: Secondary | ICD-10-CM | POA: Diagnosis present

## 2016-08-23 DIAGNOSIS — Z91013 Allergy to seafood: Secondary | ICD-10-CM

## 2016-08-23 DIAGNOSIS — I252 Old myocardial infarction: Secondary | ICD-10-CM

## 2016-08-23 LAB — CBC
HCT: 42.1 % (ref 39.0–52.0)
Hemoglobin: 14 g/dL (ref 13.0–17.0)
MCH: 28.5 pg (ref 26.0–34.0)
MCHC: 33.3 g/dL (ref 30.0–36.0)
MCV: 85.7 fL (ref 78.0–100.0)
Platelets: 142 10*3/uL — ABNORMAL LOW (ref 150–400)
RBC: 4.91 MIL/uL (ref 4.22–5.81)
RDW: 13.9 % (ref 11.5–15.5)
WBC: 6.6 10*3/uL (ref 4.0–10.5)

## 2016-08-23 LAB — BASIC METABOLIC PANEL
Anion gap: 8 (ref 5–15)
BUN: 15 mg/dL (ref 6–20)
CO2: 27 mmol/L (ref 22–32)
Calcium: 9.6 mg/dL (ref 8.9–10.3)
Chloride: 104 mmol/L (ref 101–111)
Creatinine, Ser: 1.23 mg/dL (ref 0.61–1.24)
GFR calc Af Amer: >60 mL/min (ref >60)
GFR calc non Af Amer: 58 mL/min — ABNORMAL LOW (ref >60)
Glucose, Bld: 99 mg/dL (ref 65–99)
Potassium: 3.6 mmol/L (ref 3.5–5.1)
Sodium: 139 mmol/L (ref 135–145)

## 2016-08-23 LAB — PROTIME-INR
INR: 1.11
Prothrombin Time: 14.4 seconds (ref 11.4–15.2)

## 2016-08-23 MED ORDER — ACETAMINOPHEN 325 MG PO TABS: 650.0000 mg | ORAL_TABLET | Freq: Four times a day (QID) | ORAL | Status: DC | PRN

## 2016-08-23 MED ORDER — FUROSEMIDE 40 MG PO TABS
40.0000 mg | ORAL_TABLET | Freq: Every day | ORAL | Status: DC
  Administered 2016-08-23 – 2016-09-03 (×11): 40 mg via ORAL
  Filled 2016-08-23 (×11): qty 1

## 2016-08-23 MED ORDER — DEXAMETHASONE SODIUM PHOSPHATE 4 MG/ML IJ SOLN
4.0000 mg | Freq: Four times a day (QID) | INTRAMUSCULAR | Status: AC
  Administered 2016-08-23 – 2016-08-24 (×4): 4 mg via INTRAVENOUS
  Filled 2016-08-23 (×4): qty 1

## 2016-08-23 MED ORDER — COLCHICINE 0.6 MG PO TABS
0.6000 mg | ORAL_TABLET | Freq: Two times a day (BID) | ORAL | Status: DC
  Administered 2016-08-23 – 2016-09-03 (×21): 0.6 mg via ORAL
  Filled 2016-08-23 (×21): qty 1

## 2016-08-23 MED ORDER — MORPHINE SULFATE (PF) 2 MG/ML IV SOLN
2.0000 mg | INTRAVENOUS | Status: DC | PRN
  Administered 2016-08-23 – 2016-08-28 (×5): 2 mg via INTRAVENOUS
  Filled 2016-08-23 (×5): qty 1

## 2016-08-23 MED ORDER — TESTOSTERONE 4 MG/24HR TD PT24: 1.0000 | MEDICATED_PATCH | Freq: Every day | TRANSDERMAL | Status: DC

## 2016-08-23 MED ORDER — POTASSIUM CHLORIDE IN NACL 20-0.9 MEQ/L-% IV SOLN
INTRAVENOUS | Status: DC
  Administered 2016-08-23 – 2016-08-25 (×4): via INTRAVENOUS
  Administered 2016-08-26 – 2016-09-02 (×2): 1000 mL via INTRAVENOUS
  Filled 2016-08-23 (×11): qty 1000

## 2016-08-23 MED ORDER — LEVOTHYROXINE SODIUM 100 MCG PO TABS
200.0000 ug | ORAL_TABLET | Freq: Every day | ORAL | Status: DC
  Administered 2016-08-23 – 2016-09-03 (×11): 200 ug via ORAL
  Filled 2016-08-23 (×12): qty 2

## 2016-08-23 MED ORDER — CARVEDILOL 3.125 MG PO TABS
3.1250 mg | ORAL_TABLET | Freq: Two times a day (BID) | ORAL | Status: DC
  Administered 2016-08-23 – 2016-09-03 (×18): 3.125 mg via ORAL
  Filled 2016-08-23 (×22): qty 1

## 2016-08-23 MED ORDER — INDOMETHACIN 25 MG PO CAPS
50.0000 mg | ORAL_CAPSULE | Freq: Two times a day (BID) | ORAL | Status: DC
  Administered 2016-08-24 – 2016-09-03 (×20): 50 mg via ORAL
  Filled 2016-08-23: qty 1
  Filled 2016-08-23 (×3): qty 2
  Filled 2016-08-23: qty 1
  Filled 2016-08-23: qty 2
  Filled 2016-08-23: qty 1
  Filled 2016-08-23: qty 2
  Filled 2016-08-23: qty 1
  Filled 2016-08-23 (×5): qty 2
  Filled 2016-08-23: qty 1
  Filled 2016-08-23 (×5): qty 2
  Filled 2016-08-23: qty 1
  Filled 2016-08-23 (×3): qty 2

## 2016-08-23 MED ORDER — ESCITALOPRAM OXALATE 10 MG PO TABS
20.0000 mg | ORAL_TABLET | Freq: Every day | ORAL | Status: DC
  Administered 2016-08-23 – 2016-09-03 (×11): 20 mg via ORAL
  Filled 2016-08-23 (×12): qty 2

## 2016-08-23 MED ORDER — LOSARTAN POTASSIUM 50 MG PO TABS
100.0000 mg | ORAL_TABLET | Freq: Every day | ORAL | Status: DC
  Administered 2016-08-23 – 2016-09-03 (×11): 100 mg via ORAL
  Filled 2016-08-23 (×11): qty 2

## 2016-08-23 MED ORDER — OXYCODONE HCL 5 MG PO TABS
5.0000 mg | ORAL_TABLET | ORAL | Status: DC | PRN
  Administered 2016-08-23 – 2016-09-02 (×17): 5 mg via ORAL
  Filled 2016-08-23 (×18): qty 1

## 2016-08-23 MED ORDER — ACETAMINOPHEN 650 MG RE SUPP: 650.0000 mg | Freq: Four times a day (QID) | RECTAL | Status: DC | PRN

## 2016-08-23 MED ORDER — SPIRONOLACTONE 25 MG PO TABS
100.0000 mg | ORAL_TABLET | Freq: Every day | ORAL | Status: DC
  Administered 2016-08-23 – 2016-09-03 (×11): 100 mg via ORAL
  Filled 2016-08-23 (×11): qty 4

## 2016-08-23 MED ORDER — POTASSIUM CHLORIDE CRYS ER 20 MEQ PO TBCR
20.0000 meq | EXTENDED_RELEASE_TABLET | Freq: Every day | ORAL | Status: DC
  Administered 2016-08-23 – 2016-09-02 (×10): 20 meq via ORAL
  Filled 2016-08-23 (×10): qty 1

## 2016-08-23 MED ORDER — HYDROCORTISONE 10 MG PO TABS
10.0000 mg | ORAL_TABLET | Freq: Two times a day (BID) | ORAL | Status: DC
  Administered 2016-08-24 – 2016-09-03 (×19): 10 mg via ORAL
  Filled 2016-08-23 (×22): qty 1

## 2016-08-23 MED ORDER — CIPROFLOXACIN HCL 500 MG PO TABS
500.0000 mg | ORAL_TABLET | Freq: Two times a day (BID) | ORAL | Status: DC
  Administered 2016-08-23 – 2016-09-03 (×21): 500 mg via ORAL
  Filled 2016-08-23 (×22): qty 1
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
## 2016-08-24 ENCOUNTER — Inpatient Hospital Stay (HOSPITAL_COMMUNITY): Payer: Medicare Other

## 2016-08-24 ENCOUNTER — Ambulatory Visit (INDEPENDENT_AMBULATORY_CARE_PROVIDER_SITE_OTHER): Payer: Medicare Other | Admitting: *Deleted

## 2016-08-24 ENCOUNTER — Other Ambulatory Visit: Payer: Self-pay | Admitting: Internal Medicine

## 2016-08-24 DIAGNOSIS — M545 Low back pain: Secondary | ICD-10-CM | POA: Diagnosis not present

## 2016-08-24 DIAGNOSIS — I639 Cerebral infarction, unspecified: Secondary | ICD-10-CM | POA: Diagnosis not present

## 2016-08-24 MED ORDER — DEXAMETHASONE SODIUM PHOSPHATE 4 MG/ML IJ SOLN
4.0000 mg | Freq: Four times a day (QID) | INTRAMUSCULAR | Status: AC
  Administered 2016-08-24 – 2016-08-25 (×4): 4 mg via INTRAVENOUS
  Filled 2016-08-24 (×4): qty 1

## 2016-08-24 NOTE — Progress Notes (Signed)
RN called radiology  on 08/23/2012 regarding MR. RN called again, because it was not done over night. Pt is upset that he has been here since yesterday morning without it being done.

## 2016-08-24 NOTE — Progress Notes (Signed)
Patient ID: Dennis Fountain Sr., male   DOB: 1945-11-15, 71 y.o.   MRN: [id number] Subjective: Patient reports leg pain with ambulation, but pain better controlled, no weakness  Objective: Vital signs in last 24 hours: Temp:  [97.7 F (36.5 C)-98.6 F (37 C)] 98.2 F (36.8 C) (02/14 1346) Pulse Rate:  [64-77] 64 (02/14 1346) Resp:  [18-20] 18 (02/14 1346) BP: (130-171)/(78-99) 130/78 (02/14 1346) SpO2:  [96 %-99 %] 96 % (02/14 1346)  Intake/Output from previous day: 02/13 0701 - 02/14 0700 In: 738.8 [P.O.:600; I.V.:138.8] Out: 1950 [id number] Intake/Output this shift: Total I/O In: 240 [P.O.:240] Out: -   Neurologic: Grossly normal  Lab Results: Lab Results  Component Value Date   WBC 6.6 08/23/2016   HGB 14.0 08/23/2016   HCT 42.1 08/23/2016   MCV 85.7 08/23/2016   PLT 142 (L) 08/23/2016   Lab Results  Component Value Date   INR 1.11 08/23/2016   BMET Lab Results  Component Value Date   NA 139 08/23/2016   K 3.6 08/23/2016   CL 104 08/23/2016   CO2 27 08/23/2016   GLUCOSE 99 08/23/2016   BUN 15 08/23/2016   CREATININE 1.23 08/23/2016   CALCIUM 9.6 08/23/2016    Studies/Results: Mr Lumbar Spine Wo Contrast  Result Date: 08/24/2016 CLINICAL DATA:  Back surgery x3.  Low back pain. EXAM: MRI LUMBAR SPINE WITHOUT CONTRAST TECHNIQUE: Multiplanar, multisequence MR imaging of the lumbar spine was performed. No intravenous contrast was administered. COMPARISON:  05/13/2016 FINDINGS: Segmentation:  Standard. Alignment:  Physiologic. Vertebrae:  No fracture, evidence of discitis, or bone lesion. Conus medullaris: Extends to the L1 level and appears normal. Paraspinal and other soft tissues: Postsurgical changes in the posterior paraspinal soft tissues from L2 through L5. Disc levels: Disc spaces: Posterior spinal fusion from L2 through L5. Interbody cage devices at L2-3 and L4-5. T12-L1: No significant disc bulge. No evidence of neural foraminal stenosis. No central  canal stenosis. L1-L2: No significant disc bulge. No evidence of neural foraminal stenosis. No central canal stenosis. L2-L3: Interbody fusion. Posterior osseous ridging along the right paracentral aspect narrowing the right lateral recess. No evidence of neural foraminal stenosis. No central canal stenosis. L3-L4: Moderate-sized central disc protrusion with cranial migration of disc material along the left paracentral aspect of the L3 vertebral body with mass effect on the left intraspinal L3 nerve root and L4 nerve root. No evidence of neural foraminal stenosis. No central canal stenosis. L4-L5: Interbody fusion. No evidence of neural foraminal stenosis. No central canal stenosis. L5-S1: Mild broad-based disc bulge. No evidence of neural foraminal stenosis. No central canal stenosis. IMPRESSION: 1. At L3-L4 there is a moderate-sized central disc protrusion with cranial migration of disc material along the left paracentral aspect of the L3 vertebral body with mass effect on the left intraspinal L3 nerve root and L4 nerve root. 2. Posterior spinal fusion from L2 through L5. Interbody cage devices at L2-3 and L4-5. At L2-3 there is posterior osseous ridging along the right paracentral aspect narrowing the right lateral recess. Electronically Signed   By: Kathreen Devoid   On: 08/24/2016 12:05    Assessment/Plan: HNP L3-4 L between his fusions, will try ESI and continue medical mmgt, if fails may need PLIF L3-4.   LOS: 1 day    Brylynn Hanssen S 08/24/2016, 2:19 PM

## 2016-08-25 ENCOUNTER — Observation Stay (HOSPITAL_COMMUNITY): Payer: Medicare Other

## 2016-08-25 ENCOUNTER — Ambulatory Visit: Payer: Medicare Other | Admitting: Endocrinology

## 2016-08-25 ENCOUNTER — Encounter (HOSPITAL_COMMUNITY): Payer: Self-pay | Admitting: Radiology

## 2016-08-25 DIAGNOSIS — Z823 Family history of stroke: Secondary | ICD-10-CM | POA: Diagnosis not present

## 2016-08-25 DIAGNOSIS — M5126 Other intervertebral disc displacement, lumbar region: Secondary | ICD-10-CM | POA: Diagnosis present

## 2016-08-25 DIAGNOSIS — Z833 Family history of diabetes mellitus: Secondary | ICD-10-CM | POA: Diagnosis not present

## 2016-08-25 DIAGNOSIS — E039 Hypothyroidism, unspecified: Secondary | ICD-10-CM | POA: Diagnosis not present

## 2016-08-25 DIAGNOSIS — M48061 Spinal stenosis, lumbar region without neurogenic claudication: Secondary | ICD-10-CM | POA: Diagnosis not present

## 2016-08-25 DIAGNOSIS — Z981 Arthrodesis status: Secondary | ICD-10-CM | POA: Diagnosis not present

## 2016-08-25 DIAGNOSIS — Z8673 Personal history of transient ischemic attack (TIA), and cerebral infarction without residual deficits: Secondary | ICD-10-CM | POA: Diagnosis not present

## 2016-08-25 DIAGNOSIS — Z91013 Allergy to seafood: Secondary | ICD-10-CM | POA: Diagnosis not present

## 2016-08-25 DIAGNOSIS — Z79899 Other long term (current) drug therapy: Secondary | ICD-10-CM | POA: Diagnosis not present

## 2016-08-25 DIAGNOSIS — M109 Gout, unspecified: Secondary | ICD-10-CM | POA: Diagnosis present

## 2016-08-25 DIAGNOSIS — Z96651 Presence of right artificial knee joint: Secondary | ICD-10-CM | POA: Diagnosis present

## 2016-08-25 DIAGNOSIS — Z8249 Family history of ischemic heart disease and other diseases of the circulatory system: Secondary | ICD-10-CM | POA: Diagnosis not present

## 2016-08-25 DIAGNOSIS — E785 Hyperlipidemia, unspecified: Secondary | ICD-10-CM | POA: Diagnosis not present

## 2016-08-25 DIAGNOSIS — Z91030 Bee allergy status: Secondary | ICD-10-CM | POA: Diagnosis not present

## 2016-08-25 DIAGNOSIS — I509 Heart failure, unspecified: Secondary | ICD-10-CM | POA: Diagnosis not present

## 2016-08-25 DIAGNOSIS — M79605 Pain in left leg: Secondary | ICD-10-CM | POA: Diagnosis not present

## 2016-08-25 DIAGNOSIS — I252 Old myocardial infarction: Secondary | ICD-10-CM | POA: Diagnosis not present

## 2016-08-25 DIAGNOSIS — Z888 Allergy status to other drugs, medicaments and biological substances status: Secondary | ICD-10-CM | POA: Diagnosis not present

## 2016-08-25 DIAGNOSIS — I2510 Atherosclerotic heart disease of native coronary artery without angina pectoris: Secondary | ICD-10-CM | POA: Diagnosis present

## 2016-08-25 DIAGNOSIS — Z7902 Long term (current) use of antithrombotics/antiplatelets: Secondary | ICD-10-CM | POA: Diagnosis not present

## 2016-08-25 DIAGNOSIS — R002 Palpitations: Secondary | ICD-10-CM | POA: Diagnosis not present

## 2016-08-25 DIAGNOSIS — Z8547 Personal history of malignant neoplasm of testis: Secondary | ICD-10-CM | POA: Diagnosis not present

## 2016-08-25 DIAGNOSIS — G4733 Obstructive sleep apnea (adult) (pediatric): Secondary | ICD-10-CM | POA: Diagnosis present

## 2016-08-25 DIAGNOSIS — I110 Hypertensive heart disease with heart failure: Secondary | ICD-10-CM | POA: Diagnosis not present

## 2016-08-25 HISTORY — PX: IR GENERIC HISTORICAL: IMG1180011

## 2016-08-25 MED ORDER — IOPAMIDOL (ISOVUE-M 200) INJECTION 41%
INTRAMUSCULAR | Status: AC
  Administered 2016-08-25: 1 mL
  Filled 2016-08-25: qty 10

## 2016-08-25 MED ORDER — METHYLPREDNISOLONE ACETATE 80 MG/ML IJ SUSP
INTRAMUSCULAR | Status: AC
  Filled 2016-08-25: qty 1

## 2016-08-25 MED ORDER — SODIUM CHLORIDE 0.9 % IJ SOLN
INTRAMUSCULAR | Status: AC
  Filled 2016-08-25: qty 10

## 2016-08-25 MED ORDER — LIDOCAINE HCL (PF) 1 % IJ SOLN
INTRAMUSCULAR | Status: AC
  Filled 2016-08-25: qty 10

## 2016-08-25 MED ORDER — METHYLPREDNISOLONE ACETATE 40 MG/ML IJ SUSP
INTRAMUSCULAR | Status: AC
  Filled 2016-08-25: qty 1

## 2016-08-25 NOTE — Care Management Note (Signed)
Case Management Note  Patient Details  Name: Dennis Stege Neighbors Sr. MRN: [id number] Date of Birth: 1946-03-12  Subjective/Objective:     Pt in with back pain. He is from home with his spouse.                Action/Plan: Plan is for patient to return home when medically stable. CM following for d/c needs and physician orders.   Expected Discharge Date:  08/26/16               Expected Discharge Plan:  Home/Self Care  In-House Referral:     Discharge planning Services     Post Acute Care Choice:    Choice offered to:     DME Arranged:    DME Agency:     HH Arranged:    HH Agency:     Status of Service:  In process, will continue to follow  If discussed at Long Length of Stay Meetings, dates discussed:    Additional Comments:  Pollie Friar, RN 08/25/2016, 4:05 PM

## 2016-08-25 NOTE — Progress Notes (Signed)
Carelink Summary Report / Loop Recorder 

## 2016-08-25 NOTE — Progress Notes (Signed)
Patient ID: Trish Fountain Sr., male   DOB: October 20, 1945, 71 y.o.   MRN: [id number] Subjective: Patient reports he is feeling better after epidural steroid injection done this morning. No burning down the leg. No weakness. No numbness or tingling  Objective: Vital signs in last 24 hours: Temp:  [97.5 F (36.4 C)-98.2 F (36.8 C)] 97.9 F (36.6 C) (02/15 0932) Pulse Rate:  [57-71] 71 (02/15 0932) Resp:  [18-20] 20 (02/15 0932) BP: (130-145)/(67-87) 135/67 (02/15 0932) SpO2:  [94 %-98 %] 97 % (02/15 0932)  Intake/Output from previous day: 02/14 0701 - 02/15 0700 In: 3558.8 [P.O.:720; I.V.:2838.8] Out: 1400 [Urine:1400] Intake/Output this shift: Total I/O In: 480 [P.O.:480] Out: 300 [Urine:300]  Neurologic: Grossly normal  Lab Results: Lab Results  Component Value Date   WBC 6.6 08/23/2016   HGB 14.0 08/23/2016   HCT 42.1 08/23/2016   MCV 85.7 08/23/2016   PLT 142 (L) 08/23/2016   Lab Results  Component Value Date   INR 1.11 08/23/2016   BMET Lab Results  Component Value Date   NA 139 08/23/2016   K 3.6 08/23/2016   CL 104 08/23/2016   CO2 27 08/23/2016   GLUCOSE 99 08/23/2016   BUN 15 08/23/2016   CREATININE 1.23 08/23/2016   CALCIUM 9.6 08/23/2016    Studies/Results: Mr Lumbar Spine Wo Contrast  Result Date: 08/24/2016 CLINICAL DATA:  Back surgery x3.  Low back pain. EXAM: MRI LUMBAR SPINE WITHOUT CONTRAST TECHNIQUE: Multiplanar, multisequence MR imaging of the lumbar spine was performed. No intravenous contrast was administered. COMPARISON:  05/13/2016 FINDINGS: Segmentation:  Standard. Alignment:  Physiologic. Vertebrae:  No fracture, evidence of discitis, or bone lesion. Conus medullaris: Extends to the L1 level and appears normal. Paraspinal and other soft tissues: Postsurgical changes in the posterior paraspinal soft tissues from L2 through L5. Disc levels: Disc spaces: Posterior spinal fusion from L2 through L5. Interbody cage devices at L2-3 and L4-5.  T12-L1: No significant disc bulge. No evidence of neural foraminal stenosis. No central canal stenosis. L1-L2: No significant disc bulge. No evidence of neural foraminal stenosis. No central canal stenosis. L2-L3: Interbody fusion. Posterior osseous ridging along the right paracentral aspect narrowing the right lateral recess. No evidence of neural foraminal stenosis. No central canal stenosis. L3-L4: Moderate-sized central disc protrusion with cranial migration of disc material along the left paracentral aspect of the L3 vertebral body with mass effect on the left intraspinal L3 nerve root and L4 nerve root. No evidence of neural foraminal stenosis. No central canal stenosis. L4-L5: Interbody fusion. No evidence of neural foraminal stenosis. No central canal stenosis. L5-S1: Mild broad-based disc bulge. No evidence of neural foraminal stenosis. No central canal stenosis. IMPRESSION: 1. At L3-L4 there is a moderate-sized central disc protrusion with cranial migration of disc material along the left paracentral aspect of the L3 vertebral body with mass effect on the left intraspinal L3 nerve root and L4 nerve root. 2. Posterior spinal fusion from L2 through L5. Interbody cage devices at L2-3 and L4-5. At L2-3 there is posterior osseous ridging along the right paracentral aspect narrowing the right lateral recess. Electronically Signed   By: Kathreen Devoid   On: 08/24/2016 12:05   Ir Epidurography  Result Date: 08/25/2016 CLINICAL DATA:  Lumbosacral spondylosis without myelopathy. Extruded fragment at L3-4, adjacent segment disease. EXAM: LUMBAR INTERLAMINAR EPIDURAL INJECTION FLUOROSCOPY TIME:  0.3 minutes corresponding to a Dose Area Product of 245 Gy*m2 PROCEDURE: Informed written consent was obtained.  Time-out was performed. The overlying  skin was cleansed with betadine soap and anesthetized with 1% lidocaine without epinephrine. An interlaminar approach was performed on the LEFT at L3-4. 20 gauge needle was  advanced using loss-of-resistance technique. DIAGNOSTIC/THERAPUETIC EPIDURAL INJECTION: Injection of Isovue 200 shows a good epidural pattern with spread above and below the level of needle placement, primarily on the side of needle placement. No vascular or subarachnoid opacification was seen. 120 mg of Depo-Medrol mixed with 5 cc of 1% Lidocaine were instilled. The procedure was well-tolerated, and the patient was discharged thirty minutes following the injection in good condition. IMPRESSION: Technically successful first lumbar interlaminar epidural injection at L3-4, LEFT. Electronically Signed   By: Staci Righter M.D.   On: 08/25/2016 09:52    Assessment/Plan: Overall doing much better today after epidural steroid injection. Continue current management. Mobilize as tolerated   LOS: 1 day    JONES,DAVID S 08/25/2016, 10:16 AM

## 2016-08-26 ENCOUNTER — Other Ambulatory Visit (HOSPITAL_COMMUNITY): Payer: Self-pay | Admitting: Physical Medicine and Rehabilitation

## 2016-08-26 NOTE — Evaluation (Signed)
Physical Therapy Evaluation Patient Details Name: Dennis Talbert Georg Sr. MRN: [id number] DOB: 02-10-1946 Today's Date: 08/26/2016   History of Present Illness  Pt is 71 y/o male admitted secondary to severe back and LLE pain. Pt has previous medical hx of CVA, MI, arthritis, CHF, HTN, goud, CAD, lumbar fusion, and chronic lower back pain.   Clinical Impression  Pt admitted with above diagnosis. Prior to admission, pt was independent with all functional mobility tasks. Upon evaluation, pt extremely limited in participation secondary to pain. RN notified and administered pain medications. Pt only able to ambulate short distances and required increased assist due to reports of increased pain in back and LLE. Pt will benefit from skilled PT to maximize functional mobility independence. Currently, pt is not safe to go home, but feel with pain control, pt will progress well. Will continue to follow and update POC as necessary pending pt progress.       Follow Up Recommendations Home health PT;Supervision/Assistance - 24 hour    Equipment Recommendations  None recommended by PT    Recommendations for Other Services       Precautions / Restrictions Precautions Precautions: Fall Restrictions Weight Bearing Restrictions: No      Mobility  Bed Mobility               General bed mobility comments: Pt in chair upon entry; not assessed.  Transfers Overall transfer level: Needs assistance Equipment used: Rolling walker (2 wheeled) Transfers: Sit to/from Stand Sit to Stand: Min assist         General transfer comment: Pt experiencing pain in LLE and required min A and verbal cues to power through legs. Verbal cues for appropriate hand placement.   Ambulation/Gait Ambulation/Gait assistance: Min assist Ambulation Distance (Feet): 25 Feet Assistive device: Rolling walker (2 wheeled) Gait Pattern/deviations: Decreased step length - right;Step-through pattern;Decreased weight shift to  left;Antalgic;Wide base of support;Decreased stride length;Trunk flexed Gait velocity: Decreased Gait velocity interpretation: Below normal speed for age/gender General Gait Details: Pt limited in ambulation distance secondary to reports of shooting pain in LLE. Pt demonstrating LLE drag during ambulation secondary to weakness. RN gave pt pain meds before the session. Demonstrating antalgic gait pattern during gait secondary to LLE pain.    Stairs Stairs:  (Not attempted secondary to pain. )          Wheelchair Mobility    Modified Rankin (Stroke Patients Only)       Balance Overall balance assessment: Needs assistance Sitting-balance support: Feet supported Sitting balance-Leahy Scale: Fair Sitting balance - Comments: pain in sitting in back and LLE   Standing balance support: Bilateral upper extremity supported Standing balance-Leahy Scale: Poor Standing balance comment: Pt relied heavily on BUE on RW during static standing secondary to increased pain in LLE.                              Pertinent Vitals/Pain Pain Assessment: 0-10 Pain Score: 9  Pain Location: L leg and back  Pain Descriptors / Indicators: Radiating;Shooting Pain Intervention(s): Limited activity within patient's tolerance;Monitored during session;RN gave pain meds during session;Repositioned    Home Living Family/patient expects to be discharged to:: Private residence Living Arrangements: Children;Spouse/significant other Available Help at Discharge: Family;Available PRN/intermittently Type of Home: House Home Access: Stairs to enter Entrance Stairs-Rails: Can reach both;Left;Right Entrance Stairs-Number of Steps: 5 Home Layout: Two level;Able to live on main level with bedroom/bathroom Home Equipment: Shower seat;Walker -  2 wheels;Bedside commode;Hand held shower head;Crutches;Cane - single point      Prior Function Level of Independence: Independent         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 DF and knee extension at least 3/5, hip flexion 2+/5 LLE: Unable to fully assess due to pain LLE Sensation: decreased light touch (reports of pain and pins and needles ) LLE Coordination: decreased gross motor       Communication   Communication: No difficulties  Cognition Arousal/Alertness: Awake/alert Behavior During Therapy: WFL for tasks assessed/performed Overall Cognitive Status: Within Functional Limits for tasks assessed                      General Comments General comments (skin integrity, edema, etc.): Pt demonstrating decreased participation in therapy secondary to increased pain in back and LLE.     Exercises     Assessment/Plan    PT Assessment Patient needs continued PT services  PT Problem List Decreased strength;Decreased activity tolerance;Decreased balance;Decreased mobility;Decreased coordination;Decreased knowledge of use of DME;Pain;Impaired sensation          PT Treatment Interventions Gait training;Stair training;DME instruction;Functional mobility training;Therapeutic activities;Therapeutic exercise;Neuromuscular re-education;Patient/family education    PT Goals (Current goals can be found in the Care Plan section)  Acute Rehab PT Goals Patient Stated Goal: to be able to walk without pain  PT Goal Formulation: With patient Time For Goal Achievement: 09/09/16 Potential to Achieve Goals: Fair    Frequency Min 3X/week   Barriers to discharge Inaccessible home environment Inability to complete stairs and increased distance with ambulation secondary to pain.     Co-evaluation               End of  Session Equipment Utilized During Treatment: Gait belt Activity Tolerance: Patient limited by pain Patient left: with chair alarm set;in chair;with call bell/phone within reach Nurse Communication: Mobility status         Time: [id number] PT Time Calculation (min) (ACUTE ONLY): 34 min   Charges:   PT Evaluation $PT Eval Moderate Complexity: 1 Procedure PT Treatments $Gait Training: 8-22 mins   PT G Codes:        Dennis [name] 08/26/2016, 2:59 PM   Dennis [name], PT, DPT  Acute Rehabilitation Services  Pager: [phone]

## 2016-08-26 NOTE — Progress Notes (Signed)
Patient ID: Dennis Fountain Sr., male   DOB: 11-Oct-1945, 71 y.o.   MRN: [id number] Subjective: Patient reports continued discomfort in the left leg whenever active.  Objective: Vital signs in last 24 hours: Temp:  [97.6 F (36.4 C)-98.3 F (36.8 C)] 98.1 F (36.7 C) (02/16 0501) Pulse Rate:  [54-72] 54 (02/16 0501) Resp:  [18-20] 20 (02/16 0501) BP: (131-160)/(83-102) 131/87 (02/16 0501) SpO2:  [97 %-100 %] 100 % (02/16 0501)  Intake/Output from previous day: 02/15 0701 - 02/16 0700 In: 1302 [P.O.:702; I.V.:600] Out: 300 [Urine:300] Intake/Output this shift: No intake/output data recorded.  Neurologic: Grossly normal  Lab Results: Lab Results  Component Value Date   WBC 6.6 08/23/2016   HGB 14.0 08/23/2016   HCT 42.1 08/23/2016   MCV 85.7 08/23/2016   PLT 142 (L) 08/23/2016   Lab Results  Component Value Date   INR 1.11 08/23/2016   BMET Lab Results  Component Value Date   NA 139 08/23/2016   K 3.6 08/23/2016   CL 104 08/23/2016   CO2 27 08/23/2016   GLUCOSE 99 08/23/2016   BUN 15 08/23/2016   CREATININE 1.23 08/23/2016   CALCIUM 9.6 08/23/2016    Studies/Results: Ir Epidurography  Result Date: 08/25/2016 CLINICAL DATA:  Lumbosacral spondylosis without myelopathy. Extruded fragment at L3-4, adjacent segment disease. EXAM: LUMBAR INTERLAMINAR EPIDURAL INJECTION FLUOROSCOPY TIME:  0.3 minutes corresponding to a Dose Area Product of 245 Gy*m2 PROCEDURE: Informed written consent was obtained.  Time-out was performed. The overlying skin was cleansed with betadine soap and anesthetized with 1% lidocaine without epinephrine. An interlaminar approach was performed on the LEFT at L3-4. 20 gauge needle was advanced using loss-of-resistance technique. DIAGNOSTIC/THERAPUETIC EPIDURAL INJECTION: Injection of Isovue 200 shows a good epidural pattern with spread above and below the level of needle placement, primarily on the side of needle placement. No vascular or subarachnoid  opacification was seen. 120 mg of Depo-Medrol mixed with 5 cc of 1% Lidocaine were instilled. The procedure was well-tolerated, and the patient was discharged thirty minutes following the injection in good condition. IMPRESSION: Technically successful first lumbar interlaminar epidural injection at L3-4, LEFT. Electronically Signed   By: Staci Righter M.D.   On: 08/25/2016 09:52    Assessment/Plan: Still struggling with some leg pain and some feeling of weakness when trying to walk. Continue pain control. We will take this day by day. If he gets better and is his pain is controlled and strength is good then he could possibly go home. If he does not he may need a decompression and fusion at L3-4  Dennis [name] 08/26/2016, 3:20 PM

## 2016-08-26 NOTE — Evaluation (Signed)
Occupational Therapy Evaluation Patient Details Name: Dennis Greulich Pankonin Sr. MRN: [id number] DOB: 10-10-1945 Today's Date: 08/26/2016    History of Present Illness Pt is 71 y/o male admitted secondary to severe back and LLE pain. Pt has previous medical hx of CVA, MI, arthritis, CHF, HTN, goud, CAD, lumbar fusion, and chronic lower back pain.    Clinical Impression   This 71 yo male admitted with above presents to acute OT with deficits below (thus affecting his PLOF) of being Mod I with all basic ADLs. He will benefit from acute OT without need for follow up.    Follow Up Recommendations  No OT follow up;Supervision/Assistance - 24 hour    Equipment Recommendations  None recommended by OT       Precautions / Restrictions Precautions Precautions: Fall Restrictions Weight Bearing Restrictions: No      Mobility Bed Mobility Overal bed mobility: Needs Assistance Bed Mobility: Rolling;Sidelying to Sit Rolling: Min guard Sidelying to sit: Min guard;HOB elevated (use of rail, coming out on right side (pt states he normally comes out on left side)       General bed mobility comments: Pt in chair upon entry; not assessed.  Transfers Overall transfer level: Needs assistance Equipment used: Rolling walker (2 wheeled) Transfers: Sit to/from Stand Sit to Stand: Min assist;From elevated surface         General transfer comment: Pt experiencing pain in LLE and required min A and verbal cues to power through legs. Verbal cues for appropriate hand placement.     Balance Overall balance assessment: Needs assistance Sitting-balance support: Feet supported;No upper extremity supported Sitting balance-Leahy Scale: Fair Sitting balance - Comments: pain in sitting in back and LLE   Standing balance support: Bilateral upper extremity supported Standing balance-Leahy Scale: Poor Standing balance comment: reliant on RW                            ADL Overall ADL's :  Needs assistance/impaired Eating/Feeding: Independent;Sitting   Grooming: Set up;Sitting   Upper Body Bathing: Set up;Sitting   Lower Body Bathing: Moderate assistance Lower Body Bathing Details (indicate cue type and reason): min A sit<>stand Upper Body Dressing : Set up;Sitting   Lower Body Dressing: Maximal assistance (min A sit<>stand)   Toilet Transfer: Minimal assistance;Ambulation;RW Toilet Transfer Details (indicate cue type and reason): bed>recliner 3 feet away with pt sitting down with decreased control Toileting- Clothing Manipulation and Hygiene: Moderate assistance (min A sit<>stand)                         Pertinent Vitals/Pain Pain Assessment: 0-10 Pain Score: 6  Pain Location: L leg and back  Pain Descriptors / Indicators: Radiating;Shooting Pain Intervention(s): Limited activity within patient's tolerance;Monitored during session;Repositioned     Hand Dominance Left   Extremity/Trunk Assessment Upper Extremity Assessment Upper Extremity Assessment: Overall WFL for tasks assessed       Communication Communication Communication: No difficulties   Cognition Arousal/Alertness: Awake/alert Behavior During Therapy: WFL for tasks assessed/performed Overall Cognitive Status: Within Functional Limits for tasks assessed                                Home Living Family/patient expects to be discharged to:: Private residence Living Arrangements: Children;Spouse/significant other Available Help at Discharge: Family;Available PRN/intermittently Type of Home: House Home Access: Stairs to enter [employer]  of Steps: 5 Entrance Stairs-Rails: Can reach both;Left;Right Home Layout: Two level;Able to live on main level with bedroom/bathroom Alternate Level Stairs-Number of Steps: flight Alternate Level Stairs-Rails: Right;Left Bathroom Shower/Tub: Occupational psychologist: Handicapped height     Home Equipment: Photographer - 2 wheels;Bedside commode;Hand held shower head;Crutches;Cane - single point          Prior Functioning/Environment Level of Independence: Independent        Comments: works as a Chiropodist Problem List: Decreased strength;Decreased range of motion;Impaired balance (sitting and/or standing);Pain;Obesity   OT Treatment/Interventions: Self-care/ADL training;Therapeutic activities;DME and/or AE instruction;Patient/family education;Balance training    OT Goals(Current goals can be found in the care plan section) Acute Rehab OT Goals Patient Stated Goal: to be able to walk without pain  OT Goal Formulation: With patient Time For Goal Achievement: 09/09/16 Potential to Achieve Goals: Good  OT Frequency: Min 2X/week              End of Session Equipment Utilized During Treatment: Gait belt;Rolling walker  Activity Tolerance: Patient limited by pain Patient left: in chair;with call bell/phone within reach;with chair alarm set   Time: [id number] OT Time Calculation (min): 24 min Charges:  OT General Charges $OT Visit: 1 Procedure OT Evaluation $OT Eval Moderate Complexity: 1 Procedure OT Treatments $Self Care/Home Management : 8-22 mins  Almon Register [id number] 08/26/2016, 3:34 PM

## 2016-08-27 MED ORDER — DOCUSATE SODIUM 100 MG PO CAPS
100.0000 mg | ORAL_CAPSULE | Freq: Two times a day (BID) | ORAL | Status: DC
  Administered 2016-08-27 – 2016-09-01 (×4): 100 mg via ORAL
  Filled 2016-08-27 (×10): qty 1

## 2016-08-27 MED ORDER — POLYETHYLENE GLYCOL 3350 17 G PO PACK
17.0000 g | PACK | Freq: Every day | ORAL | Status: DC
  Administered 2016-08-27 – 2016-09-03 (×3): 17 g via ORAL
  Filled 2016-08-27 (×5): qty 1

## 2016-08-27 NOTE — Progress Notes (Signed)
Patient ID: Dennis Fountain Sr., male   DOB: 05-May-1946, 71 y.o.   MRN: [id number] Patient doing about the same no improvement and left lower extremity pain and weakness stable from admission.  4 minus out of 5 iliopsoas quads 5 out of 5 distally were left lower extremity right lower extremity out of 5  Continue observation plan surgical decompression fusion next week per Dr. Ronnald Ramp

## 2016-08-27 NOTE — Progress Notes (Signed)
OT NOTE  MD please write order for patient to shower with OT prior to surg with DR Theresia Bough   OTR/L Pager: [phone] Office: [phone] .

## 2016-08-27 NOTE — Progress Notes (Signed)
Occupational Therapy Treatment Patient Details Name: Dennis [name]. MRN: [id number] DOB: 31-Oct-1945 Today's Date: 08/27/2016    History of present illness Pt is 71 y/o male admitted secondary to severe back and LLE pain. Pt has previous medical hx of CVA, MI, arthritis, CHF, HTN, goud, CAD, lumbar fusion, and chronic lower back pain.  pending surg scheduled with dr Ronnald Ramp   OT comments  Ot requesting order for shower prior to pending surg with DR Lawarence Meek. Pt currently provided sponge bath this session at sink level. Pt demonstrates need for (A) for LB and reliance on sink surface for posture. Pt will need updated goals with back precautions s/p surg. OT to continue to follow acutely.    Follow Up Recommendations  Home health OT    Equipment Recommendations  Other (comment) (TBA s/p surg next week)    Recommendations for Other Services      Precautions / Restrictions Precautions Precautions: Back;Fall Precaution Comments: pt with L LE weakness and needs cuse for safety        Mobility Bed Mobility Overal bed mobility: Modified Independent             General bed mobility comments: incr time . Educated on rolled sheet to help with L LE lift out of  bed   Transfers Overall transfer level: Needs assistance Equipment used: Rolling walker (2 wheeled) Transfers: Sit to/from Stand Sit to Stand: Min guard;From elevated surface         General transfer comment: pt needed cues for safety but insistent on R LE outside of RW and L LE inside. pt pulling up on RW. pt will need elevated surfaces for safety    Balance Overall balance assessment: Needs assistance         Standing balance support: Bilateral upper extremity supported;During functional activity Standing balance-Leahy Scale: Poor Standing balance comment: reliance on RW and UE support. pt states "i have high pain tolerance and I am somewhere down [address] right now hurting"                    ADL  Overall ADL's : Needs assistance/impaired Eating/Feeding: Independent   Grooming: Wash/dry hands;Wash/dry face;Oral care;Applying deodorant;Min guard;Standing;Sitting   Upper Body Bathing: Min guard;Standing   Lower Body Bathing: Min guard;Sit to/from stand   Upper Body Dressing : Minimal assistance;Sitting       Toilet Transfer: Designer, television/film set Details (indicate cue type and reason): requires use of bil UE and does not fit BIL LE into RW. pt pushign out of the way and states "a man knows his limitations and i have to do it like this"          Functional mobility during ADLs: Min guard;Rolling walker (chair follow) General ADL Comments: pt c/o needing bath and provided bath at sink level .       Vision                     Perception     Praxis      Cognition   Behavior During Therapy: [facility] for tasks assessed/performed Overall Cognitive Status: Within Functional Limits for tasks assessed                       Extremity/Trunk Assessment               Exercises     Shoulder Instructions       General Comments  Pertinent Vitals/ Pain       Pain Assessment: 0-10 Pain Score: 9  Pain Location: back Pain Descriptors / Indicators: Radiating;Shooting Pain Intervention(s): Limited activity within patient's tolerance;Monitored during session;Premedicated before session;Repositioned  Home Living                                          Prior Functioning/Environment              Frequency  Min 2X/week        Progress Toward Goals  OT Goals(current goals can now be found in the care plan section)  Progress towards OT goals: Progressing toward goals  Acute Rehab OT Goals Patient Stated Goal: to be able to walk without pain  OT Goal Formulation: With patient Time For Goal Achievement: 09/09/16 Potential to Achieve Goals: Good ADL Goals Pt Will Perform Grooming: with set-up;with  supervision;standing Pt Will Perform Lower Body Dressing: with min guard assist;with adaptive equipment;sit to/from stand Pt Will Transfer to Toilet: with supervision;ambulating;grab bars Additional ADL Goal #1: Pt will be Mod I in and OOB with HOB flat and no rail from left hand side  Plan Discharge plan needs to be updated    Co-evaluation                 End of Session Equipment Utilized During Treatment: Rolling walker   Activity Tolerance Patient tolerated treatment well   Patient Left in chair;with call bell/phone within reach   Nurse Communication Mobility status;Precautions        Time: [id number] OT Time Calculation (min): 31 min  Charges: OT General Charges $OT Visit: 1 Procedure OT Treatments $Self Care/Home Management : 23-37 mins  Parke Poisson B 08/27/2016, 9:56 AM   Jeri Modena   OTR/L Pager: [phone] Office: [phone] .

## 2016-08-28 NOTE — Progress Notes (Signed)
Patient ID: Dennis Fountain Sr., male   DOB: Nov 26, 1945, 71 y.o.   MRN: [id number] Vital signs are stable Patient has reasonable comfort until he moves then pain and spasms are unbearable He is awaiting surgical intervention as mobility is very difficult form

## 2016-08-28 NOTE — Progress Notes (Deleted)
Patient bed alarm sounding. Patient sitting on edge of bed when RN entered room. Patients honeycomb dressing completely saturated and dripping. Bloody drainage saturating patients gown, bed pad and bottom sheet. Full linen change and patient dressing changed. Assigned RN notified. Occlusive portion of dressing removed, old honeycomb was not easily removed, new honeycomb dressing applied overlaying old and drainage marked by assigned RN.

## 2016-08-29 ENCOUNTER — Other Ambulatory Visit: Payer: Self-pay | Admitting: Neurological Surgery

## 2016-08-29 NOTE — Care Management Important Message (Signed)
Important Message  Patient Details  Name: Dennis Lafevers Brandner Sr. MRN: [id number] Date of Birth: 1946/04/13   Medicare Important Message Given:  Yes    Rosendo Couser Montine Circle 08/29/2016, 12:47 PM

## 2016-08-29 NOTE — Progress Notes (Signed)
OT Cancellation Note  Patient Details Name: Dennis Campanale Chirico Sr. MRN: [id number] DOB: Aug 14, 1945   Cancelled Treatment:    Reason Eval/Treat Not Completed: Patient declined, no reason specified Pt with no recall of previous visit with therapist and reports "i am too loopy and just got medication to try anything"  [name]   OTR/L Pager: [phone] Office: [phone] .  08/29/2016, 1:42 PM

## 2016-08-29 NOTE — Progress Notes (Signed)
Physical Therapy Treatment Patient Details Name: Dennis Iwanowski Bushnell Sr. MRN: [id number] DOB: 1945/10/10 Today's Date: 08/29/2016    History of Present Illness Pt is 71 y/o male admitted secondary to severe back and LLE pain. Pt has previous medical hx of CVA, MI, arthritis, CHF, HTN, goud, CAD, lumbar fusion, and chronic lower back pain.  pending surg scheduled with dr Ronnald Ramp    PT Comments    Pt slowly progressing towards goals. Pt reporting decreased pain this session, however, ambulation distance continues to be limited by shooting pain. Pt requested seated rest break after short ambulation distance. Pt required multiple safety cues for RW use during this session secondary to LLE deficits. Pt tolerated some seated BLE exercise through pain free range this session. Continues to be unsafe to go home at this point, however, with pain management, expecting pt to progress well with mobility tasks. Will continue to follow.   Follow Up Recommendations  Home health PT;Supervision/Assistance - 24 hour     Equipment Recommendations  None recommended by PT    Recommendations for Other Services       Precautions / Restrictions Precautions Precautions: Back;Fall Precaution Booklet Issued: No Precaution Comments: Pt continues to require safety cues for LLE weakness during gait  Restrictions Weight Bearing Restrictions: No    Mobility  Bed Mobility Overal bed mobility: Needs Assistance Bed Mobility: Sidelying to Sit Rolling: Supervision Sidelying to sit: Supervision;HOB elevated       General bed mobility comments: Verbal cues for log rolling technique. No assist required for LLE mangement.   Transfers Overall transfer level: Needs assistance Equipment used: Rolling walker (2 wheeled) (bariwalker) Transfers: Sit to/from Stand Sit to Stand: Min guard         General transfer comment: Pt required safety cues for appropraite use of RW during transfer. Demonstrated BLE outside of RW  and pulled up on walker despite safety cues for handplacement. Will continue to require follow up safety instruction.   Ambulation/Gait Ambulation/Gait assistance: Min assist Ambulation Distance (Feet): 25 Feet Assistive device: Rolling walker (2 wheeled) Gait Pattern/deviations: Decreased step length - left;Antalgic;Wide base of support;Trunk flexed;Decreased weight shift to left;Decreased stride length Gait velocity: Decreased Gait velocity interpretation: Below normal speed for age/gender General Gait Details: Given verbal cues for upright posture throughout gait training. Pt demonstrating less LLE drag during gait than previous session, however, continues to require cues to increase step height to prevent drag. Continues to be limited in ambulation distance secondary to shooting pain in LLE. Requesting to sit down after short ambulation distance secondary to pain.    Stairs            Wheelchair Mobility    Modified Rankin (Stroke Patients Only)       Balance Overall balance assessment: Needs assistance Sitting-balance support: No upper extremity supported;Feet supported Sitting balance-Leahy Scale: Fair     Standing balance support: Bilateral upper extremity supported Standing balance-Leahy Scale: Poor Standing balance comment: Relied on RW and UE for static standing balance secondary to increased pain in standing.                     Cognition Arousal/Alertness: Awake/alert Behavior During Therapy: WFL for tasks assessed/performed Overall Cognitive Status: Within Functional Limits for tasks assessed                      Exercises General Exercises - Lower Extremity Ankle Circles/Pumps: AROM;Right;10 reps;Seated Long Arc Quad: AROM;Right;10 reps;Seated Hip ABduction/ADduction: AROM;10 reps;Seated;Right (  isometric) Hip Flexion/Marching: AROM;Right;10 reps;Seated Other Exercises Other Exercises: Performed seated LLE supported ankle pumps, isometric  hip adduction, and knee extension through pain free range X10. Pt reporting some pain, however, reports it is tolerable. Hip flexion exercises avoided secondary to increased pain.     General Comments General comments (skin integrity, edema, etc.): Pt performed toileting activities in standing, requiring min A for balance. Pt requesting to remain standing during toileting, and requested that therapist leave the room. Educated about safety and purpose of assist required for fall prevention. Pt agreeable. Pt experienced increased pain in LLE during standing toileting activities and required break to lean against wall. Pt required use of RUE to maintain balance during toileting.       Pertinent Vitals/Pain Pain Assessment: 0-10 Pain Score: 6  Pain Location: back, LLE Pain Descriptors / Indicators: Shooting;Guarding Pain Intervention(s): Limited activity within patient's tolerance;Monitored during session;Repositioned    Home Living                      Prior Function            PT Goals (current goals can now be found in the care plan section) Acute Rehab PT Goals Patient Stated Goal: to be able to walk without pain  PT Goal Formulation: With patient Time For Goal Achievement: 09/09/16 Potential to Achieve Goals: Good Progress towards PT goals: Progressing toward goals    Frequency    Min 3X/week      PT Plan Current plan remains appropriate    Co-evaluation             End of Session Equipment Utilized During Treatment: Gait belt (Required safety education about purpose of gait belt.) Activity Tolerance: Patient limited by pain Patient left: in chair;with call bell/phone within reach;with chair alarm set     Time: [id number] PT Time Calculation (min) (ACUTE ONLY): 21 min  Charges:  $Gait Training: 8-22 mins                    G Codes:      Mamie Levers 08/29/2016, 8:11 AM   Nicky Pugh, PT, DPT  Acute Rehabilitation Services  Pager:  [phone]

## 2016-08-29 NOTE — Progress Notes (Signed)
Patient ID: Dennis Fountain Sr., male   DOB: 1946-04-06, 71 y.o.   MRN: [id number] Will plan surgery on thursday unless he improves

## 2016-08-29 NOTE — Progress Notes (Signed)
Patient ID: Dennis Fountain Sr., male   DOB: [birth year], 71 y.o.   MRN: [id number] Subjective: Patient reports pain when up, some weakness in L leg  Objective: Vital signs in last 24 hours: Temp:  [98.1 F (36.7 C)-98.8 F (37.1 C)] 98.2 F (36.8 C) (02/19 0842) Pulse Rate:  [50-59] 59 (02/19 1032) Resp:  [18-19] 18 (02/19 1032) BP: (115-136)/(63-80) 130/80 (02/19 1032) SpO2:  [97 %-99 %] 99 % (02/19 1032)  Intake/Output from previous day: 02/18 0701 - 02/19 0700 In: -  Out: 1425 [Urine:1425] Intake/Output this shift: No intake/output data recorded.  Neurologic: Grossly normal to in bed exam  Lab Results: Lab Results  Component Value Date   WBC 6.6 08/23/2016   HGB 14.0 08/23/2016   HCT 42.1 08/23/2016   MCV 85.7 08/23/2016   PLT 142 (L) 08/23/2016   Lab Results  Component Value Date   INR 1.11 08/23/2016   BMET Lab Results  Component Value Date   NA 139 08/23/2016   K 3.6 08/23/2016   CL 104 08/23/2016   CO2 27 08/23/2016   GLUCOSE 99 08/23/2016   BUN 15 08/23/2016   CREATININE 1.23 08/23/2016   CALCIUM 9.6 08/23/2016    Studies/Results: No results found.  Assessment/Plan: Plan OR Thursday unless he somehow improves prior  Plan: mas PLIF L3-4 with extension of hardware   LOS: 5 days    Carmen Vallecillo S 08/29/2016, 12:28 PM

## 2016-08-30 NOTE — Progress Notes (Signed)
Patient ID: Trish Fountain Sr., male   DOB: June 23, 1946, 71 y.o.   MRN: [id number] Subjective: Patient reports shooting pain L leg, though maybe some better  Objective: Vital signs in last 24 hours: Temp:  [97.5 F (36.4 C)-98.4 F (36.9 C)] 97.5 F (36.4 C) (02/20 0621) Pulse Rate:  [50-65] 50 (02/20 0621) Resp:  [16-20] 16 (02/20 0621) BP: (118-138)/(63-84) 138/83 (02/20 0621) SpO2:  [96 %-100 %] 100 % (02/20 0621)  Intake/Output from previous day: 02/19 0701 - 02/20 0700 In: 240 [P.O.:240] Out: 1800 [Urine:1800] Intake/Output this shift: No intake/output data recorded.  Neurologic: Grossly normal  Lab Results: Lab Results  Component Value Date   WBC 6.6 08/23/2016   HGB 14.0 08/23/2016   HCT 42.1 08/23/2016   MCV 85.7 08/23/2016   PLT 142 (L) 08/23/2016   Lab Results  Component Value Date   INR 1.11 08/23/2016   BMET Lab Results  Component Value Date   NA 139 08/23/2016   K 3.6 08/23/2016   CL 104 08/23/2016   CO2 27 08/23/2016   GLUCOSE 99 08/23/2016   BUN 15 08/23/2016   CREATININE 1.23 08/23/2016   CALCIUM 9.6 08/23/2016    Studies/Results: No results found.  Assessment/Plan: Plan PLIF L3-4 thursday   LOS: 5 days    Natayah Warmack S 08/30/2016, 8:22 AM

## 2016-08-30 NOTE — Progress Notes (Addendum)
Occupational Therapy Treatment Patient Details Name: Dennis Laurich Lurz Sr. MRN: [id number] DOB: 1945/10/21 Today's Date: 08/30/2016    History of present illness Pt is 71 y/o male admitted secondary to severe back and LLE pain. Pt has previous medical hx of CVA, MI, arthritis, CHF, HTN, goud, CAD, lumbar fusion, and chronic lower back pain.  pending surg scheduled with dr Ronnald Ramp   OT comments  Pt progressing toward OT goals. Pt continues to require VC's for safe use of RW and for bed mobility techniques in preparation for ADL participation. Pt able to complete toilet transfer and grooming tasks with min guard assist. OT will continue to follow acutely.  Follow Up Recommendations  Home health OT    Equipment Recommendations  Other (comment) (TBD post-operatively)       Precautions / Restrictions Precautions Precautions: Back;Fall Precaution Booklet Issued: No Precaution Comments: Pt continues to require safety cues for LLE weakness during gait  Restrictions Weight Bearing Restrictions: No       Mobility Bed Mobility Overal bed mobility: Needs Assistance Bed Mobility: Sidelying to Sit Rolling: Supervision Sidelying to sit: Supervision;HOB elevated       General bed mobility comments: Verbal cues for log rolling technique. No assist required for LLE mangement.   Transfers Overall transfer level: Needs assistance Equipment used: Rolling walker (2 wheeled) Transfers: Sit to/from Stand Sit to Stand: Min guard         General transfer comment: Pt requires VC's for safe use of RW.    Balance Overall balance assessment: Needs assistance Sitting-balance support: No upper extremity supported;Feet supported Sitting balance-Leahy Scale: Fair Sitting balance - Comments: pain in sitting in back and LLE   Standing balance support: Bilateral upper extremity supported Standing balance-Leahy Scale: Fair Standing balance comment: Able to stand at sink for grooming tasks without UE  support.                   ADL Overall ADL's : Needs assistance/impaired     Grooming: Wash/dry hands;Min guard;Standing                   Toilet Transfer: Designer, television/film set Details (indicate cue type and reason): Pt attempting to transition back to chair without RW although remains unsteady.         Functional mobility during ADLs: Min guard;Rolling walker General ADL Comments: Pt laughing loudly during session at slightly inappropriate times. Very particular with movement and insists on completing tasks in the way that he "prefers."      Vision                     Perception     Praxis      Cognition   Behavior During Therapy: [facility] for tasks assessed/performed Overall Cognitive Status: Within Functional Limits for tasks assessed                         Exercises     Shoulder Instructions       General Comments      Pertinent Vitals/ Pain       Pain Assessment: Faces Faces Pain Scale: Hurts even more Pain Location: back, LLE Pain Descriptors / Indicators: Shooting;Guarding Pain Intervention(s): Limited activity within patient's tolerance;Monitored during session;Repositioned  Home Living  Prior Functioning/Environment              Frequency  Min 2X/week        Progress Toward Goals  OT Goals(current goals can now be found in the care plan section)  Progress towards OT goals: Progressing toward goals  Acute Rehab OT Goals Patient Stated Goal: to be able to walk without pain  OT Goal Formulation: With patient Time For Goal Achievement: 09/09/16 Potential to Achieve Goals: Good ADL Goals Pt Will Perform Grooming: with set-up;with supervision;standing Pt Will Perform Lower Body Dressing: with min guard assist;with adaptive equipment;sit to/from stand Pt Will Transfer to Toilet: with supervision;ambulating;grab bars Additional ADL Goal #1: Pt  will be Mod I in and OOB with HOB flat and no rail from left hand side  Plan Discharge plan needs to be updated    Co-evaluation                 End of Session Equipment Utilized During Treatment: Rolling walker  OT Visit Diagnosis: Unsteadiness on feet (R26.81)   Activity Tolerance Patient tolerated treatment well   Patient Left in chair;with call bell/phone within reach   Nurse Communication Mobility status        Time: [id number] OT Time Calculation (min): 18 min  Charges: OT General Charges $OT Visit: 1 Procedure OT Treatments $Self Care/Home Management : 8-22 mins  Norman Herrlich, MS OTR/L  Pager: [place] A Dennis [name] 08/30/2016, 4:43 PM

## 2016-08-30 NOTE — Care Management Note (Signed)
Case Management Note  Patient Details  Name: Dennis Lassman Bolding Sr. MRN: [id number] Date of Birth: 1945-09-26  Subjective/Objective:                    Action/Plan: Pt continuing to c/o back pain. Plan is for OR on Thursday. CM following for d/c needs.   Expected Discharge Date:  08/26/16               Expected Discharge Plan:  Home/Self Care  In-House Referral:     Discharge planning Services     Post Acute Care Choice:    Choice offered to:     DME Arranged:    DME Agency:     HH Arranged:    HH Agency:     Status of Service:  In process, will continue to follow  If discussed at Long Length of Stay Meetings, dates discussed:    Additional Comments:  Pollie Friar, RN 08/30/2016, 3:44 PM

## 2016-08-31 LAB — SURGICAL PCR SCREEN
MRSA, PCR: NEGATIVE
Staphylococcus aureus: NEGATIVE

## 2016-08-31 MED ORDER — DEXAMETHASONE SODIUM PHOSPHATE 10 MG/ML IJ SOLN: 10.0000 mg | INTRAMUSCULAR | Status: DC

## 2016-08-31 MED ORDER — DEXTROSE 5 % IV SOLN
3.0000 g | INTRAVENOUS | Status: AC
  Administered 2016-09-01: 3 g via INTRAVENOUS
  Filled 2016-08-31: qty 3000

## 2016-08-31 NOTE — Progress Notes (Addendum)
Physical Therapy Treatment Patient Details Name: Dennis Sheeley Achenbach Sr. MRN: [id number] DOB: 04/10/1946 Today's Date: 08/31/2016    History of Present Illness Pt is 71 y/o male admitted secondary to severe back and LLE pain. Pt has previous medical hx of CVA, MI, arthritis, CHF, HTN, goud, CAD, lumbar fusion, and chronic lower back pain.  pending surg scheduled with dr Ronnald Ramp    PT Comments    Pt slowly progressing towards goals. Pt ambulated increased distance with less LLE drag, however, continues to be limited secondary to pain in LLE and requires cues for LLE clearance during gait. Will continue to follow throughout acute stay and update POC as necessary to maximize functional mobility independence.    Follow Up Recommendations  Home health PT;Supervision/Assistance - 24 hour     Equipment Recommendations  None recommended by PT    Recommendations for Other Services       Precautions / Restrictions Precautions Precautions: Back;Fall Precaution Booklet Issued: No Precaution Comments: Continue to require safety cues during gait for LLE.  Restrictions Weight Bearing Restrictions: No    Mobility  Bed Mobility Overal bed mobility: Needs Assistance Bed Mobility: Sidelying to Sit Rolling: Supervision Sidelying to sit: Supervision;HOB elevated       General bed mobility comments: Verbal cues for log rolling. Required elevated HOB to perform transfer.   Transfers Overall transfer level: Needs assistance Equipment used: Rolling walker (2 wheeled) Transfers: Sit to/from Stand Sit to Stand: Min assist         General transfer comment: Required min A to stand secondary to increased pain during transfer. Required VC for safety to wait for PT to perform mobility. Verbal cues for appropriate use of RW during transfer and proper hand placement.   Ambulation/Gait Ambulation/Gait assistance: Min assist Ambulation Distance (Feet): 75 Feet Assistive device: Rolling walker (2  wheeled) Gait Pattern/deviations: Decreased step length - left;Decreased weight shift to left;Decreased stride length;Antalgic;Trunk flexed;Wide base of support Gait velocity: Decreased Gait velocity interpretation: Below normal speed for age/gender General Gait Details: Pt demonstrating antalgic gait secondary to LLE pain. Verbal cues for appropriate step height in LLE needed for foot clearance, however, demonstrating less L foot drag from previous session. Continues to be limited in ambulation distance secondary to pain. Verbal cues given for upright posture during gait.    Stairs            Wheelchair Mobility    Modified Rankin (Stroke Patients Only)       Balance Overall balance assessment: Needs assistance Sitting-balance support: No upper extremity supported;Feet supported Sitting balance-Leahy Scale: Fair Sitting balance - Comments: Pain in LLE with sitting   Standing balance support: Bilateral upper extremity supported;During functional activity Standing balance-Leahy Scale: Poor Standing balance comment: Required use of BUE on RW for static balance secondary to pain in LLE.                     Cognition Arousal/Alertness: Awake/alert Behavior During Therapy: WFL for tasks assessed/performed Overall Cognitive Status: Within Functional Limits for tasks assessed                      Exercises      General Comments General comments (skin integrity, edema, etc.): Pt required education regarding use of gait belt for safety.       Pertinent Vitals/Pain Pain Assessment: Faces Faces Pain Scale: Hurts even more Pain Location: back, LLE Pain Descriptors / Indicators: Shooting;Guarding Pain Intervention(s): Limited activity within patient's  tolerance;Monitored during session;Repositioned    Home Living                      Prior Function            PT Goals (current goals can now be found in the care plan section) Acute Rehab PT  Goals Patient Stated Goal: to be able to walk without pain  PT Goal Formulation: With patient Time For Goal Achievement: 09/09/16 Potential to Achieve Goals: Good Progress towards PT goals: Progressing toward goals    Frequency    Min 3X/week      PT Plan Current plan remains appropriate    Co-evaluation             End of Session Equipment Utilized During Treatment: Gait belt Activity Tolerance: Patient limited by pain Patient left: Other (comment);with bed alarm set;with call bell/phone within reach (sitting EOB, NT notified) Nurse Communication: Mobility status PT Visit Diagnosis: Unsteadiness on feet (R26.81);Muscle weakness (generalized) (M62.81);Pain Pain - Right/Left: Left Pain - part of body: Leg     Time: [id number] PT Time Calculation (min) (ACUTE ONLY): 12 min  Charges:  $Gait Training: 8-22 mins                    G Codes:       Mamie Levers 08/31/2016, 12:06 PM   Nicky Pugh, PT, DPT  Acute Rehabilitation Services  Pager: [phone]

## 2016-08-31 NOTE — Progress Notes (Signed)
Patient ID: Dennis Fountain Sr., male   DOB: 07/20/1945, 71 y.o.   MRN: [id number] Subjective: Patient reports no change in leg pain with weakness and poor functional status  Objective: Vital signs in last 24 hours: Temp:  [97.5 F (36.4 C)-98.2 F (36.8 C)] 97.6 F (36.4 C) (02/21 0939) Pulse Rate:  [52-70] 70 (02/21 0939) Resp:  [16-18] 18 (02/21 0939) BP: (106-153)/(58-88) 136/75 (02/21 0939) SpO2:  [98 %-100 %] 100 % (02/21 0939)  Intake/Output from previous day: 02/20 0701 - 02/21 0700 In: 240 [P.O.:240] Out: 1350 [Urine:1350] Intake/Output this shift: Total I/O In: 540 [P.O.:540] Out: 400 [Urine:400]  Neurologic: Grossly normal with some decreased strength LLE prox  Lab Results: Lab Results  Component Value Date   WBC 6.6 08/23/2016   HGB 14.0 08/23/2016   HCT 42.1 08/23/2016   MCV 85.7 08/23/2016   PLT 142 (L) 08/23/2016   Lab Results  Component Value Date   INR 1.11 08/23/2016   BMET Lab Results  Component Value Date   NA 139 08/23/2016   K 3.6 08/23/2016   CL 104 08/23/2016   CO2 27 08/23/2016   GLUCOSE 99 08/23/2016   BUN 15 08/23/2016   CREATININE 1.23 08/23/2016   CALCIUM 9.6 08/23/2016    Studies/Results: No results found.  Assessment/Plan: Back and leg pain with weakness secondary to HNP - plan PLIF tomorrow. He understands risks include but not limited to bleeding, infection, CSF leak, root injury, NTW, hardware failure, worse pain, no relief, adjacent level dz, need for further surgery , and anesthesia risks including PE/ DVT/ death   LOS: 6 days    Jacquette Canales S 08/31/2016, 10:13 AM

## 2016-09-01 ENCOUNTER — Telehealth: Payer: Self-pay | Admitting: Internal Medicine

## 2016-09-01 ENCOUNTER — Encounter (HOSPITAL_COMMUNITY)
Admission: AD | Disposition: A | Discharge status: Home / Self Care | Payer: Self-pay | Source: Ambulatory Visit | Attending: Neurological Surgery

## 2016-09-01 ENCOUNTER — Inpatient Hospital Stay (HOSPITAL_COMMUNITY): Payer: Medicare Other

## 2016-09-01 ENCOUNTER — Inpatient Hospital Stay (HOSPITAL_COMMUNITY): Payer: Medicare Other | Admitting: Certified Registered Nurse Anesthetist

## 2016-09-01 ENCOUNTER — Encounter (HOSPITAL_COMMUNITY): Payer: Self-pay | Admitting: Certified Registered Nurse Anesthetist

## 2016-09-01 HISTORY — PX: MAXIMUM ACCESS (MAS)POSTERIOR LUMBAR INTERBODY FUSION (PLIF) 1 LEVEL: SHX6368

## 2016-09-01 LAB — TYPE AND SCREEN
ABO/RH(D): O POS
Antibody Screen: NEGATIVE

## 2016-09-01 SURGERY — FOR MAXIMUM ACCESS (MAS) POSTERIOR LUMBAR INTERBODY FUSION (PLIF) 1 LEVEL
Anesthesia: General | Site: Back

## 2016-09-01 MED ORDER — FENTANYL CITRATE (PF) 100 MCG/2ML IJ SOLN
INTRAMUSCULAR | Status: DC | PRN
  Administered 2016-09-01 (×2): 50 ug via INTRAVENOUS

## 2016-09-01 MED ORDER — FENTANYL CITRATE (PF) 100 MCG/2ML IJ SOLN
INTRAMUSCULAR | Status: AC
  Filled 2016-09-01: qty 4

## 2016-09-01 MED ORDER — LIDOCAINE 2% (20 MG/ML) 5 ML SYRINGE
INTRAMUSCULAR | Status: AC
  Filled 2016-09-01: qty 5

## 2016-09-01 MED ORDER — ROCURONIUM BROMIDE 10 MG/ML (PF) SYRINGE
PREFILLED_SYRINGE | INTRAVENOUS | Status: DC | PRN
  Administered 2016-09-01 (×2): 50 mg via INTRAVENOUS
  Administered 2016-09-01: 20 mg via INTRAVENOUS

## 2016-09-01 MED ORDER — THROMBIN 20000 UNITS EX SOLR
CUTANEOUS | Status: DC | PRN
  Administered 2016-09-01: 17:00:00 via TOPICAL

## 2016-09-01 MED ORDER — THROMBIN 5000 UNITS EX SOLR
CUTANEOUS | Status: DC | PRN
  Administered 2016-09-01: 17:00:00 via TOPICAL

## 2016-09-01 MED ORDER — CHLORHEXIDINE GLUCONATE CLOTH 2 % EX PADS: 6.0000 | MEDICATED_PAD | Freq: Once | CUTANEOUS | Status: DC

## 2016-09-01 MED ORDER — EPHEDRINE 5 MG/ML INJ
INTRAVENOUS | Status: AC
  Filled 2016-09-01: qty 10

## 2016-09-01 MED ORDER — 0.9 % SODIUM CHLORIDE (POUR BTL) OPTIME
TOPICAL | Status: DC | PRN
  Administered 2016-09-01: 1000 mL

## 2016-09-01 MED ORDER — THROMBIN 5000 UNITS EX SOLR
CUTANEOUS | Status: AC
  Filled 2016-09-01: qty 5000

## 2016-09-01 MED ORDER — ARTIFICIAL TEARS OP OINT
TOPICAL_OINTMENT | OPHTHALMIC | Status: AC
Start: 2016-09-01 — End: 2016-09-01
  Filled 2016-09-01: qty 3.5

## 2016-09-01 MED ORDER — ARTIFICIAL TEARS OP OINT
TOPICAL_OINTMENT | OPHTHALMIC | Status: DC | PRN
  Administered 2016-09-01: 1 via OPHTHALMIC

## 2016-09-01 MED ORDER — VANCOMYCIN HCL 1000 MG IV SOLR
INTRAVENOUS | Status: DC | PRN
  Administered 2016-09-01: 1000 mg via TOPICAL

## 2016-09-01 MED ORDER — PHENYLEPHRINE HCL 10 MG/ML IJ SOLN
INTRAVENOUS | Status: DC | PRN
  Administered 2016-09-01: 20 ug/min via INTRAVENOUS

## 2016-09-01 MED ORDER — PROMETHAZINE HCL 25 MG/ML IJ SOLN: 6.2500 mg | INTRAMUSCULAR | Status: DC | PRN

## 2016-09-01 MED ORDER — SUGAMMADEX SODIUM 200 MG/2ML IV SOLN
INTRAVENOUS | Status: DC | PRN
Start: 2016-09-01 — End: 2016-09-01
  Administered 2016-09-01: 400 mg via INTRAVENOUS

## 2016-09-01 MED ORDER — PROPOFOL 10 MG/ML IV BOLUS
INTRAVENOUS | Status: AC
  Filled 2016-09-01: qty 20

## 2016-09-01 MED ORDER — LACTATED RINGERS IV SOLN: INTRAVENOUS | Status: DC

## 2016-09-01 MED ORDER — BACITRACIN 50000 UNITS IM SOLR
INTRAMUSCULAR | Status: DC | PRN
  Administered 2016-09-01: 17:00:00

## 2016-09-01 MED ORDER — VANCOMYCIN HCL 1000 MG IV SOLR
INTRAVENOUS | Status: AC
  Filled 2016-09-01: qty 1000

## 2016-09-01 MED ORDER — THROMBIN 20000 UNITS EX SOLR
CUTANEOUS | Status: AC
  Filled 2016-09-01: qty 20000

## 2016-09-01 MED ORDER — LIDOCAINE 2% (20 MG/ML) 5 ML SYRINGE
INTRAMUSCULAR | Status: DC | PRN
  Administered 2016-09-01: 60 mg via INTRAVENOUS

## 2016-09-01 MED ORDER — FENTANYL CITRATE (PF) 100 MCG/2ML IJ SOLN
INTRAMUSCULAR | Status: AC
  Filled 2016-09-01: qty 2

## 2016-09-01 MED ORDER — EPHEDRINE SULFATE-NACL 50-0.9 MG/10ML-% IV SOSY
PREFILLED_SYRINGE | INTRAVENOUS | Status: DC | PRN
  Administered 2016-09-01 (×4): 10 mg via INTRAVENOUS

## 2016-09-01 MED ORDER — ONDANSETRON HCL 4 MG/2ML IJ SOLN
INTRAMUSCULAR | Status: AC
  Filled 2016-09-01: qty 2

## 2016-09-01 MED ORDER — LACTATED RINGERS IV SOLN
INTRAVENOUS | Status: DC | PRN
  Administered 2016-09-01 (×3): via INTRAVENOUS

## 2016-09-01 MED ORDER — MEPERIDINE HCL 25 MG/ML IJ SOLN: 6.2500 mg | INTRAMUSCULAR | Status: DC | PRN

## 2016-09-01 MED ORDER — SUCCINYLCHOLINE CHLORIDE 200 MG/10ML IV SOSY
PREFILLED_SYRINGE | INTRAVENOUS | Status: AC
  Filled 2016-09-01: qty 10

## 2016-09-01 MED ORDER — ROCURONIUM BROMIDE 50 MG/5ML IV SOSY
PREFILLED_SYRINGE | INTRAVENOUS | Status: AC
  Filled 2016-09-01: qty 5

## 2016-09-01 MED ORDER — ONDANSETRON HCL 4 MG/2ML IJ SOLN
INTRAMUSCULAR | Status: DC | PRN
  Administered 2016-09-01: 4 mg via INTRAVENOUS

## 2016-09-01 MED ORDER — BUPIVACAINE HCL (PF) 0.25 % IJ SOLN
INTRAMUSCULAR | Status: DC | PRN
  Administered 2016-09-01: 4 mL

## 2016-09-01 MED ORDER — SUCCINYLCHOLINE CHLORIDE 200 MG/10ML IV SOSY
PREFILLED_SYRINGE | INTRAVENOUS | Status: DC | PRN
  Administered 2016-09-01: 140 mg via INTRAVENOUS

## 2016-09-01 MED ORDER — DEXAMETHASONE SODIUM PHOSPHATE 4 MG/ML IJ SOLN
INTRAMUSCULAR | Status: DC | PRN
  Administered 2016-09-01: 10 mg via INTRAVENOUS

## 2016-09-01 MED ORDER — BUPIVACAINE HCL (PF) 0.25 % IJ SOLN
INTRAMUSCULAR | Status: AC
  Filled 2016-09-01: qty 30

## 2016-09-01 MED ORDER — PROPOFOL 10 MG/ML IV BOLUS
INTRAVENOUS | Status: DC | PRN
  Administered 2016-09-01: 200 mg via INTRAVENOUS

## 2016-09-01 MED ORDER — FENTANYL CITRATE (PF) 100 MCG/2ML IJ SOLN
25.0000 ug | INTRAMUSCULAR | Status: DC | PRN
  Administered 2016-09-01 (×2): 50 ug via INTRAVENOUS

## 2016-09-01 SURGICAL SUPPLY — 62 items
BAG DECANTER FOR FLEXI CONT (MISCELLANEOUS) ×2 IMPLANT
BASKET BONE COLLECTION (BASKET) ×2 IMPLANT
BENZOIN TINCTURE PRP APPL 2/3 (GAUZE/BANDAGES/DRESSINGS) ×2 IMPLANT
BIT DRILL PLIF MAS DISP 5.5MM (DRILL) ×1 IMPLANT
BLADE CLIPPER SURG (BLADE) IMPLANT
BONE CANC CHIPS 20CC PCAN1/4 (Bone Implant) ×2 IMPLANT
BUR MATCHSTICK NEURO 3.0 LAGG (BURR) ×2 IMPLANT
CAGE COROENT LRG 9X9X28-8 (Cage) ×4 IMPLANT
CANISTER SUCT 3000ML PPV (MISCELLANEOUS) ×2 IMPLANT
CARTRIDGE OIL MAESTRO DRILL (MISCELLANEOUS) ×1 IMPLANT
CHIPS CANC BONE 20CC PCAN1/4 (Bone Implant) ×1 IMPLANT
CONT SPEC 4OZ CLIKSEAL STRL BL (MISCELLANEOUS) ×2 IMPLANT
COVER BACK TABLE 24X17X13 BIG (DRAPES) IMPLANT
COVER BACK TABLE 60X90IN (DRAPES) ×2 IMPLANT
DERMABOND ADVANCED (GAUZE/BANDAGES/DRESSINGS) ×1
DERMABOND ADVANCED .7 DNX12 (GAUZE/BANDAGES/DRESSINGS) ×1 IMPLANT
DIFFUSER DRILL AIR PNEUMATIC (MISCELLANEOUS) ×2 IMPLANT
DRAPE C-ARM 42X72 X-RAY (DRAPES) ×2 IMPLANT
DRAPE C-ARMOR (DRAPES) ×2 IMPLANT
DRAPE LAPAROTOMY 100X72X124 (DRAPES) ×2 IMPLANT
DRAPE POUCH INSTRU U-SHP 10X18 (DRAPES) ×2 IMPLANT
DRAPE SURG 17X23 STRL (DRAPES) ×2 IMPLANT
DRILL PLIF MAS DISP 5.5MM (DRILL) ×2
DRSG OPSITE POSTOP 4X6 (GAUZE/BANDAGES/DRESSINGS) ×2 IMPLANT
DURAPREP 26ML APPLICATOR (WOUND CARE) ×2 IMPLANT
ELECT REM PT RETURN 9FT ADLT (ELECTROSURGICAL) ×2
ELECTRODE REM PT RTRN 9FT ADLT (ELECTROSURGICAL) ×1 IMPLANT
EVACUATOR 1/8 PVC DRAIN (DRAIN) IMPLANT
GAUZE SPONGE 4X4 16PLY XRAY LF (GAUZE/BANDAGES/DRESSINGS) IMPLANT
GLOVE BIO SURGEON STRL SZ8 (GLOVE) ×4 IMPLANT
GOWN STRL REUS W/ TWL LRG LVL3 (GOWN DISPOSABLE) IMPLANT
GOWN STRL REUS W/ TWL XL LVL3 (GOWN DISPOSABLE) ×2 IMPLANT
GOWN STRL REUS W/TWL 2XL LVL3 (GOWN DISPOSABLE) IMPLANT
GOWN STRL REUS W/TWL LRG LVL3 (GOWN DISPOSABLE)
GOWN STRL REUS W/TWL XL LVL3 (GOWN DISPOSABLE) ×2
HEMOSTAT POWDER KIT SURGIFOAM (HEMOSTASIS) ×2 IMPLANT
KIT BASIN OR (CUSTOM PROCEDURE TRAY) ×2 IMPLANT
KIT ROOM TURNOVER OR (KITS) ×2 IMPLANT
MARKER SKIN DUAL TIP RULER LAB (MISCELLANEOUS) ×2 IMPLANT
MILL MEDIUM DISP (BLADE) ×2 IMPLANT
NEEDLE HYPO 25X1 1.5 SAFETY (NEEDLE) ×2 IMPLANT
NS IRRIG 1000ML POUR BTL (IV SOLUTION) ×2 IMPLANT
OIL CARTRIDGE MAESTRO DRILL (MISCELLANEOUS) ×2
PACK LAMINECTOMY NEURO (CUSTOM PROCEDURE TRAY) ×2 IMPLANT
PAD ARMBOARD 7.5X6 YLW CONV (MISCELLANEOUS) ×6 IMPLANT
ROD 55MM (Rod) ×2 IMPLANT
ROD SPNL 55XPREBNT NS MAS (Rod) ×2 IMPLANT
SCREW LOCK (Screw) ×6 IMPLANT
SCREW LOCK FXNS SPNE MAS PL (Screw) ×6 IMPLANT
SCREW SHANK 5.5X40MM (Screw) ×2 IMPLANT
SCREW TULIP 5.5 (Screw) ×2 IMPLANT
SPONGE LAP 4X18 X RAY DECT (DISPOSABLE) IMPLANT
SPONGE SURGIFOAM ABS GEL 100 (HEMOSTASIS) ×2 IMPLANT
STRIP CLOSURE SKIN 1/2X4 (GAUZE/BANDAGES/DRESSINGS) ×2 IMPLANT
SUT VIC AB 0 CT1 18XCR BRD8 (SUTURE) ×2 IMPLANT
SUT VIC AB 0 CT1 8-18 (SUTURE) ×2
SUT VIC AB 2-0 CP2 18 (SUTURE) ×4 IMPLANT
SUT VIC AB 3-0 SH 8-18 (SUTURE) ×4 IMPLANT
TOWEL OR 17X24 6PK STRL BLUE (TOWEL DISPOSABLE) ×2 IMPLANT
TOWEL OR 17X26 10 PK STRL BLUE (TOWEL DISPOSABLE) ×2 IMPLANT
TRAY FOLEY W/METER SILVER 16FR (SET/KITS/TRAYS/PACK) ×2 IMPLANT
WATER STERILE IRR 1000ML POUR (IV SOLUTION) ×2 IMPLANT

## 2016-09-01 NOTE — Telephone Encounter (Signed)
Pt called from hospital to cancel reprogramming appt for 2-27-will have surgery in two hours and will still be in hospital several more days. Wants to know if he can get his loop reprogrammed while there? pls call before 1p [phone] or call wife [name]  [phone]

## 2016-09-01 NOTE — Op Note (Signed)
08/23/2016 - 09/01/2016  6:41 PM  PATIENT:  Dennis Nations Sansom Sr.  71 y.o. male  PRE-OPERATIVE DIAGNOSIS:  Adjacent level stenosis L3-4 with a large disc deviation and back and leg pain  POST-OPERATIVE DIAGNOSIS:  same  PROCEDURE:   1. Decompressive lumbar laminectomy L3-4 requiring more work than would be required for a simple exposure of the disk for PLIF in order to adequately decompress the neural elements and address the spinal stenosis 2. Posterior lumbar interbody fusion L3-4 using PEEK interbody cages packed with morcellized allograft and autograft 3. Posterior fixation L3-4 using cortical pedicle screws.  4. Intertransverse arthrodesis L3-4 using morcellized autograft and allograft. 5. Removal of instrumentation L2-3 and L4-5  SURGEON:  Sherley Bounds, MD  ASSISTANTS: Dr. Arnoldo Morale  ANESTHESIA:  General  EBL: 200 ml  Total I/O In: 2000 [I.V.:2000] Out: 1000 [Urine:800; Blood:200]  BLOOD ADMINISTERED:none  DRAINS: none   INDICATION FOR PROCEDURE: This patient presented with severe back and left leg pain. Imaging revealed a large disc herniation L3-4 with spinal stenosis between his 2 previous fusions. The patient tried a reasonable attempt at conservative medical measures without relief. I recommended decompression and instrumented fusion to address the stenosis as well as the segment once stability.  Patient understood the risks, benefits, and alternatives and potential outcomes and wished to proceed.  PROCEDURE DETAILS:  The patient was brought to the operating room. After induction of generalized endotracheal anesthesia the patient was rolled into the prone position on chest rolls and all pressure points were padded. The patient's lumbar region was cleaned and then prepped with DuraPrep and draped in the usual sterile fashion. Anesthesia was injected and then a dorsal midline incision was made and carried down to the lumbosacral fascia. The fascia was opened and the paraspinous  musculature was taken down in a subperiosteal fashion to expose L3-4 as well as the previously placed hardware. The locking caps were removed from the pedicle screws of L4 and L5 bilaterally and L2-3 on the left. The rods were removed. Each screw Had good purchase. A self-retaining retractor was placed. Intraoperative fluoroscopy confirmed my level, and I started with placement of the L3 on the right cortical pedicle screw. The pedicle screw entry zone was identified utilizing surface landmarks and  AP and lateral fluoroscopy. I scored the cortex with the high-speed drill and then used the hand drill and EMG monitoring to drill an upward and outward direction into the pedicle. I then tapped line to line, and the tap was also monitored. I then placed a high 0.5 x 40 mm cortical pedicle screw into the pedicle of L3 on the right. I then turned my attention to the decompression and complete lumbar laminectomies, hemi- facetectomies, and foraminotomies were performed at L3-4. The patient had significant spinal stenosis and this required more work than would be required for a simple exposure of the disc for posterior lumbar interbody fusion. Much more generous decompression was undertaken in order to adequately decompress the neural elements and address the patient's leg pain. The yellow ligament was removed to expose the underlying dura and nerve roots, and generous foraminotomies were performed to adequately decompress the neural elements. Both the exiting and traversing nerve roots were decompressed on both sides until a coronary dilator passed easily along the nerve roots. There was a large disc herniation with a large superior free fragment underneath the left L3 nerve root. Multiple fragments were removed with a nerve hook coronary dilator and pituitary rongeur. Once the decompression was complete,  I turned my attention to the posterior lower lumbar interbody fusion. The epidural venous vasculature was coagulated  and cut sharply. Disc space was incised and the initial discectomy was performed with pituitary rongeurs. The disc space was distracted with sequential distractors to a height of 10 mm. We then used a series of scrapers and shavers to prepare the endplates for fusion. The midline was prepared with Epstein curettes. Once the complete discectomy was finished, we packed an appropriate sized peek interbody cage with local autograft and morcellized allograft, gently retracted the nerve root, and tapped the cage into position at L3-4.  The midline between the cages was packed with morselized autograft and allograft. . We then decorticated the transverse processes and laid a mixture of morcellized autograft and allograft out over these to perform intertransverse arthrodesis at L3-4. We then placed lordotic rods into the multiaxial screw heads of the pedicle screws and locked these in position with the locking caps and anti-torque device. We then checked our construct with AP and lateral fluoroscopy. Irrigated with copious amounts of bacitracin-containing saline solution. Inspected the nerve roots once again to assure adequate decompression, lined to the dura with Gelfoam, and closed the muscle and the fascia with 0 Vicryl. Closed the subcutaneous tissues with 2-0 Vicryl and subcuticular tissues with 3-0 Vicryl. The skin was closed with benzoin and Steri-Strips. Dressing was then applied, the patient was awakened from general anesthesia and transported to the recovery room in stable condition. At the end of the procedure all sponge, needle and instrument counts were correct.   PLAN OF CARE: admit to inpatient  PATIENT DISPOSITION:  PACU - hemodynamically stable.   Delay start of Pharmacological VTE agent (>24hrs) due to surgical blood loss or risk of bleeding:  yes

## 2016-09-01 NOTE — Anesthesia Preprocedure Evaluation (Addendum)
Anesthesia Evaluation  Patient identified by MRN, date of birth, ID band Patient awake    Reviewed: Allergy & Precautions, NPO status , Patient's Chart, lab work & pertinent test results, reviewed documented beta blocker date and time   History of Anesthesia Complications (+) PONV and history of anesthetic complications  Airway Mallampati: II  TM Distance: >3 FB Neck ROM: Full    Dental  (+) Teeth Intact, Dental Advisory Given, Chipped,    Pulmonary sleep apnea and Continuous Positive Airway Pressure Ventilation ,    breath sounds clear to auscultation       Cardiovascular hypertension, Pt. on medications and Pt. on home beta blockers + CAD, + Past MI and +CHF   Rhythm:Regular Rate:Normal     Neuro/Psych PSYCHIATRIC DISORDERS Depression CVA    GI/Hepatic negative GI ROS, Neg liver ROS,   Endo/Other  Hypothyroidism   Renal/GU Renal disease  negative genitourinary   Musculoskeletal  (+) Arthritis , Osteoarthritis,    Abdominal   Peds negative pediatric ROS (+)  Hematology   Anesthesia Other Findings   Reproductive/Obstetrics negative OB ROS                            Anesthesia Physical Anesthesia Plan  ASA: III  Anesthesia Plan: General   Post-op Pain Management:    Induction: Intravenous  Airway Management Planned: Oral ETT  Additional Equipment:   Intra-op Plan:   Post-operative Plan: Extubation in OR  Informed Consent: I have reviewed the patients History and Physical, chart, labs and discussed the procedure including the risks, benefits and alternatives for the proposed anesthesia with the patient or authorized representative who has indicated his/her understanding and acceptance.   Dental advisory given  Plan Discussed with: CRNA  Anesthesia Plan Comments:         Anesthesia Quick Evaluation

## 2016-09-01 NOTE — Telephone Encounter (Signed)
Called pt back and informed him that we can have someone from [employer] to reprogram his device, he needs to call back with his room number and location in hospital after surgery so we can inform the rep. Pt voiced understanding.

## 2016-09-01 NOTE — Transfer of Care (Signed)
Immediate Anesthesia Transfer of Care Note  Patient: Dennis [name].  Procedure(s) Performed: Procedure(s): LUMBAR THREE- FOUR MAXIMUM ACCESS (MAS) POSTERIOR LUMBAR INTERBODY FUSION (PLIF) (N/A)  Patient Location: PACU  Anesthesia Type:General  Level of Consciousness: awake, alert , oriented and patient cooperative  Airway & Oxygen Therapy: Patient Spontanous Breathing and Patient connected to nasal cannula oxygen  Post-op Assessment: Report given to RN and Post -op Vital signs reviewed and stable  Post vital signs: Reviewed VSS  Last Vitals:  Vitals:   09/01/16 0529 09/01/16 1007  BP: 130/72 (!) 168/86  Pulse: (!) 55 (!) 55  Resp: 18 20  Temp: 36.7 C 36.4 C    Last Pain:  Vitals:   09/01/16 1007  TempSrc: Oral  PainSc:       Patients Stated Pain Goal: 4 ([id number])  Complications: No apparent anesthesia complications and Patient re-intubated

## 2016-09-01 NOTE — Anesthesia Procedure Notes (Signed)
Procedure Name: Intubation Date/Time: 09/01/2016 3:48 PM Performed by: Garrison Columbus T Pre-anesthesia Checklist: Patient identified, Emergency Drugs available, Suction available and Patient being monitored Patient Re-evaluated:Patient Re-evaluated prior to inductionOxygen Delivery Method: Circle System Utilized Preoxygenation: Pre-oxygenation with 100% oxygen Intubation Type: IV induction and Rapid sequence Ventilation: Oral airway inserted - appropriate to patient size, Two handed mask ventilation required and Mask ventilation with difficulty Laryngoscope Size: Miller and 2 Grade View: Grade I Tube type: Oral Tube size: 7.5 mm Number of attempts: 1 Airway Equipment and Method: Stylet and Oral airway Placement Confirmation: ETT inserted through vocal cords under direct vision,  positive ETCO2 and breath sounds checked- equal and bilateral Secured at: 23 cm Tube secured with: Tape Dental Injury: Teeth and Oropharynx as per pre-operative assessment

## 2016-09-01 NOTE — Anesthesia Postprocedure Evaluation (Signed)
Anesthesia Post Note  Patient: Dennis [name] Mikes Sr.  Procedure(s) Performed: Procedure(s) (LRB): LUMBAR THREE- FOUR MAXIMUM ACCESS (MAS) POSTERIOR LUMBAR INTERBODY FUSION (PLIF) (N/A)  Patient location during evaluation: PACU Anesthesia Type: General Level of consciousness: sedated and patient cooperative Pain management: pain level controlled Vital Signs Assessment: post-procedure vital signs reviewed and stable Respiratory status: spontaneous breathing Cardiovascular status: stable Anesthetic complications: no       Last Vitals:  Vitals:   09/01/16 2005 09/01/16 2147  BP: 116/71 118/75  Pulse: 87 97  Resp: 14 16  Temp: 37 C 37.1 C    Last Pain:  Vitals:   09/01/16 2147  TempSrc: Oral  PainSc:                  Dennis [name]

## 2016-09-02 ENCOUNTER — Encounter (HOSPITAL_COMMUNITY): Payer: Self-pay | Admitting: Neurological Surgery

## 2016-09-02 LAB — CUP PACEART REMOTE DEVICE CHECK
Date Time Interrogation Session: 20180115224007
Date Time Interrogation Session: 20180214224155
Implantable Pulse Generator Implant Date: 20170719
Implantable Pulse Generator Implant Date: 20170719

## 2016-09-02 MED ORDER — ACETAMINOPHEN 650 MG RE SUPP: 650.0000 mg | RECTAL | Status: DC | PRN

## 2016-09-02 MED ORDER — OXYCODONE HCL 5 MG PO TABS
5.0000 mg | ORAL_TABLET | ORAL | Status: DC | PRN
  Administered 2016-09-02: 5 mg via ORAL
  Filled 2016-09-02: qty 2

## 2016-09-02 MED ORDER — SODIUM CHLORIDE 0.9% FLUSH: 3.0000 mL | INTRAVENOUS | Status: DC | PRN

## 2016-09-02 MED ORDER — POTASSIUM CHLORIDE IN NACL 20-0.9 MEQ/L-% IV SOLN
INTRAVENOUS | Status: DC
  Administered 2016-09-02: 17:00:00 via INTRAVENOUS
  Administered 2016-09-03: 1000 mL via INTRAVENOUS
  Filled 2016-09-02 (×2): qty 1000

## 2016-09-02 MED ORDER — CEFAZOLIN SODIUM-DEXTROSE 2-4 GM/100ML-% IV SOLN
2.0000 g | Freq: Three times a day (TID) | INTRAVENOUS | Status: AC
  Administered 2016-09-02 (×2): 2 g via INTRAVENOUS
  Filled 2016-09-02 (×2): qty 100

## 2016-09-02 MED ORDER — CELECOXIB 200 MG PO CAPS
200.0000 mg | ORAL_CAPSULE | Freq: Two times a day (BID) | ORAL | Status: DC
  Administered 2016-09-02 – 2016-09-03 (×2): 200 mg via ORAL
  Filled 2016-09-02 (×2): qty 1

## 2016-09-02 MED ORDER — METHOCARBAMOL 1000 MG/10ML IJ SOLN
500.0000 mg | Freq: Four times a day (QID) | INTRAVENOUS | Status: DC | PRN
  Filled 2016-09-02: qty 5

## 2016-09-02 MED ORDER — SODIUM CHLORIDE 0.9 % IV SOLN: 250.0000 mL | INTRAVENOUS | Status: DC

## 2016-09-02 MED ORDER — ACETAMINOPHEN 325 MG PO TABS: 650.0000 mg | ORAL_TABLET | Freq: Four times a day (QID) | ORAL | Status: DC | PRN

## 2016-09-02 MED ORDER — SENNA 8.6 MG PO TABS
1.0000 | ORAL_TABLET | Freq: Two times a day (BID) | ORAL | Status: DC
  Administered 2016-09-02 – 2016-09-03 (×2): 8.6 mg via ORAL
  Filled 2016-09-02 (×2): qty 1

## 2016-09-02 MED ORDER — SODIUM CHLORIDE 0.9% FLUSH
3.0000 mL | Freq: Two times a day (BID) | INTRAVENOUS | Status: DC
  Administered 2016-09-02: 3 mL via INTRAVENOUS

## 2016-09-02 MED ORDER — MORPHINE SULFATE (PF) 2 MG/ML IV SOLN: 2.0000 mg | INTRAVENOUS | Status: DC | PRN

## 2016-09-02 MED ORDER — METHOCARBAMOL 500 MG PO TABS: 500.0000 mg | ORAL_TABLET | Freq: Four times a day (QID) | ORAL | Status: DC | PRN

## 2016-09-02 MED ORDER — ACETAMINOPHEN 650 MG RE SUPP: 650.0000 mg | Freq: Four times a day (QID) | RECTAL | Status: DC | PRN

## 2016-09-02 MED ORDER — PHENOL 1.4 % MT LIQD: 1.0000 | OROMUCOSAL | Status: DC | PRN

## 2016-09-02 MED ORDER — MENTHOL 3 MG MT LOZG: 1.0000 | LOZENGE | OROMUCOSAL | Status: DC | PRN

## 2016-09-02 MED ORDER — ACETAMINOPHEN 325 MG PO TABS: 650.0000 mg | ORAL_TABLET | ORAL | Status: DC | PRN

## 2016-09-02 MED ORDER — ONDANSETRON HCL 4 MG/2ML IJ SOLN: 4.0000 mg | INTRAMUSCULAR | Status: DC | PRN

## 2016-09-02 NOTE — Care Management Important Message (Signed)
Important Message  Patient Details  Name: Dennis Dempewolf Diebold Sr. MRN: [id number] Date of Birth: 07/02/1946   Medicare Important Message Given:  Yes    Nathen May 09/02/2016, 2:08 PM

## 2016-09-02 NOTE — Progress Notes (Signed)
Physical Therapy Treatment Patient Details Name: Dennis Montantes Baranek Sr. MRN: [id number] DOB: December 17, 1945 Today's Date: 09/02/2016    History of Present Illness Pt is 71 y/o male admitted secondary to severe back and LLE pain. Pt is s/p L3-4 PLIF 09/01/16. Pt has previous medical hx of CVA, MI, arthritis, CHF, HTN, goud, CAD, lumbar fusion, and chronic lower back pain.     PT Comments    Pt presents s/p L3-4 PLIF on 07/01/17. Noticed drainage on gown and pillow behind pt from surgical incision. Notified RN and RN reported he would address. Updated history and frequency of PT to address updated needs of pt. Pt reports less pain and requiring less assist and tolerating increased ambulation distance this session. Pt performed stair training and required some assist for safety. Discussed current needs and d/c home vs. SNF. Pt reports he has all necessary equipment at home and his son will be home 24/7 to assist with mobility tasks as necessary. Continue to recommend d/c recommendations below based on current pt presentation. Will continue to follow.      Follow Up Recommendations  Home health PT;Supervision/Assistance - 24 hour     Equipment Recommendations  None recommended by PT    Recommendations for Other Services       Precautions / Restrictions Precautions Precautions: Back Precaution Booklet Issued: No Precaution Comments: Pt able to recall 3/3 precautions.  Restrictions Weight Bearing Restrictions: No    Mobility  Bed Mobility Overal bed mobility: Needs Assistance Bed Mobility: Sidelying to Sit Rolling: Supervision Sidelying to sit: Supervision;HOB elevated       General bed mobility comments: In chair upon entry  Transfers Overall transfer level: Needs assistance Equipment used: Rolling walker (2 wheeled) Transfers: Sit to/from Stand Sit to Stand: Supervision         General transfer comment: Verbal cues for appropriate UE placement to perform safe transfer.    Ambulation/Gait Ambulation/Gait assistance: Supervision;Min guard Ambulation Distance (Feet): 200 Feet Assistive device: Rolling walker (2 wheeled) Gait Pattern/deviations: Step-through pattern;Decreased stride length;Decreased weight shift to left;Antalgic;Wide base of support;Trunk flexed Gait velocity: Decreased Gait velocity interpretation: Below normal speed for age/gender General Gait Details: Demonstrating no LLE toe drag throughout gait, however continues to demonstrate decreased step height in LLE. Pt requiring min guard for obstacle navigation, and supervision for remainder of gait training for safety. Verbal cues for appropriate upright posture.    Stairs Stairs: Yes   Stair Management: Two rails;Step to pattern;Forwards Number of Stairs: 4 General stair comments: Required min Guard for safety. Required verbal cues for appropriate LE sequencing for stair navigation.   Wheelchair Mobility    Modified Rankin (Stroke Patients Only)       Balance                                    Cognition Arousal/Alertness: Awake/alert Behavior During Therapy: WFL for tasks assessed/performed Overall Cognitive Status: Within Functional Limits for tasks assessed                      Exercises      General Comments General comments (skin integrity, edema, etc.): Pt's dressing over incision was leaking; noticed drainage on gown and pillow behind back in chair. Notified RN about drainage and RN reported he would address. Pt asked about d/c to SNF. PT educated about current mobility status and safe d/c options. Education about generalized walking program.  Pertinent Vitals/Pain Pain Assessment: Faces Pain Score: 4  Faces Pain Scale: Hurts little more Pain Location: back, LLE Pain Descriptors / Indicators: Sore;Operative site guarding Pain Intervention(s): Limited activity within patient's tolerance;Monitored during session;Repositioned    Home  Living                      Prior Function            PT Goals (current goals can now be found in the care plan section) Acute Rehab PT Goals Patient Stated Goal: to be able to walk without pain  PT Goal Formulation: With patient Time For Goal Achievement: 09/09/16 Potential to Achieve Goals: Good Progress towards PT goals: Progressing toward goals    Frequency    Min 5X/week      PT Plan Frequency needs to be updated (S/p back surgery)    Co-evaluation             End of Session         PT Visit Diagnosis: Unsteadiness on feet (R26.81);Muscle weakness (generalized) (M62.81);Pain Pain - Right/Left: Left Pain - part of body: Leg     Time: [id number] PT Time Calculation (min) (ACUTE ONLY): 25 min  Charges:  $Gait Training: 23-37 mins                    G Codes:       Mamie Levers 09/02/2016, 1:12 PM  Nicky Pugh, PT, DPT  Acute Rehabilitation Services  Pager: [phone]

## 2016-09-02 NOTE — Progress Notes (Signed)
Pt's incision was bleeding overnight, honey comb dressing was saturated replaced with ABD pad, does appear after ambulating Pt that there is some drainage will look again at shift change.

## 2016-09-02 NOTE — Progress Notes (Addendum)
Occupational Therapy Treatment Patient Details Name: Dennis Pawloski Colclasure Sr. MRN: [id number] DOB: Sep 20, 1945 Today's Date: 09/02/2016    History of present illness Pt is 71 y/o male admitted secondary to severe back and LLE pain. Pt has previous medical hx of CVA, MI, arthritis, CHF, HTN, goud, CAD, lumbar fusion, and chronic lower back pain. 2/22 pt had sx per Dr Ronnald Ramp (Posterior lumbar interbody fusion L3-4 )     Follow Up Recommendations  Home health OT    Equipment Recommendations  Pt has all needed DME          Mobility Bed Mobility Overal bed mobility: Needs Assistance Bed Mobility: Sidelying to Sit Rolling: Supervision Sidelying to sit: Supervision;HOB elevated       General bed mobility comments: Verbal cues for log rolling. Required elevated HOB to perform transfer.   Transfers Overall transfer level: Needs assistance Equipment used: Rolling walker (2 wheeled) Transfers: Sit to/from Stand Sit to Stand: Mod assist         General transfer comment: needed VC for sit to stand and safe transition        ADL Overall ADL's : Needs assistance/impaired                         Toilet Transfer: Minimal assistance;RW Toilet Transfer Details (indicate cue type and reason): sit to stand to use urinal Toileting- Clothing Manipulation and Hygiene: Minimal assistance;Sit to/from stand         General ADL Comments: pt agreed to get out of bed for lunch .  Pt did need stand EOB with min A.  Pt stated he didnt need walker but OT encouraged him to use.  Pt neeed mod A sit to stand initially from bed                Cognition   Behavior During Therapy: [facility] for tasks assessed/performed Overall Cognitive Status: Within Functional Limits for tasks assessed                         Exercises     Shoulder Instructions       General Comments      Pertinent Vitals/ Pain       Pain Score: 4  Pain Location: back Pain Descriptors / Indicators:  Sore Pain Intervention(s): Limited activity within patient's tolerance;Monitored during session         Frequency  Min 2X/week        Progress Toward Goals  OT Goals(current goals can now be found in the care plan section)  Progress towards OT goals: Progressing toward goals     Plan Discharge plan remains appropriate       End of Session Equipment Utilized During Treatment: Rolling walker  OT Visit Diagnosis: Unsteadiness on feet (R26.81)   Activity Tolerance Patient tolerated treatment well   Patient Left in chair;with call bell/phone within reach   Nurse Communication Mobility status        Time: 1128-1140 OT Time Calculation (min): 12 min  Charges: OT General Charges $OT Visit: 1 Procedure OT Treatments $Self Care/Home Management : 8-22 mins  [place], [place]   Dennis [name] 09/02/2016, 12:06 PM

## 2016-09-02 NOTE — Progress Notes (Signed)
Patient ID: Dennis Fountain Sr., male   DOB: 28-Sep-1945, 71 y.o.   MRN: [id number] Subjective: Patient reports no leg pain, walked with PT  Objective: Vital signs in last 24 hours: Temp:  [97 F (36.1 C)-98.7 F (37.1 C)] 97 F (36.1 C) (02/23 1051) Pulse Rate:  [75-97] 78 (02/23 1051) Resp:  [13-20] 20 (02/23 1051) BP: (115-142)/(70-97) 136/76 (02/23 1051) SpO2:  [93 %-100 %] 97 % (02/23 1051)  Intake/Output from previous day: 02/22 0701 - 02/23 0700 In: 2240 [P.O.:240; I.V.:2000] Out: 2100 [Urine:1900; Blood:200] Intake/Output this shift: Total I/O In: -  Out: 325 [Urine:325]  Neurologic: Grossly normal  Lab Results: Lab Results  Component Value Date   WBC 6.6 08/23/2016   HGB 14.0 08/23/2016   HCT 42.1 08/23/2016   MCV 85.7 08/23/2016   PLT 142 (L) 08/23/2016   Lab Results  Component Value Date   INR 1.11 08/23/2016   BMET Lab Results  Component Value Date   NA 139 08/23/2016   K 3.6 08/23/2016   CL 104 08/23/2016   CO2 27 08/23/2016   GLUCOSE 99 08/23/2016   BUN 15 08/23/2016   CREATININE 1.23 08/23/2016   CALCIUM 9.6 08/23/2016    Studies/Results: Dg Lumbar Spine 2-3 Views  Result Date: 09/01/2016 CLINICAL DATA:  Interbody fusion lumbar spine EXAM: DG C-ARM 61-120 MIN; LUMBAR SPINE - 2-3 VIEW COMPARISON:  MRI 08/24/2016, 08/25/2016 FINDINGS: Two low resolution intraoperative spot views of the lumbar spine. Total fluoroscopy time was 28 seconds. Posterior stabilization rods and fixating screws at L3-L4 with interbody device noted. Interbody device at L4-L5. Single fixating screw on the left at L2 with removal of spinal rod. Two fixating screws at L5 with removal of fixating rods from L4-L5. IMPRESSION: Intraoperative fluoroscopic assistance provided during lumbar spine surgery. Electronically Signed   By: Donavan Foil M.D.   On: 09/01/2016 21:27   Dg C-arm 1-60 Min  Result Date: 09/01/2016 CLINICAL DATA:  Interbody fusion lumbar spine EXAM: DG C-ARM  61-120 MIN; LUMBAR SPINE - 2-3 VIEW COMPARISON:  MRI 08/24/2016, 08/25/2016 FINDINGS: Two low resolution intraoperative spot views of the lumbar spine. Total fluoroscopy time was 28 seconds. Posterior stabilization rods and fixating screws at L3-L4 with interbody device noted. Interbody device at L4-L5. Single fixating screw on the left at L2 with removal of spinal rod. Two fixating screws at L5 with removal of fixating rods from L4-L5. IMPRESSION: Intraoperative fluoroscopic assistance provided during lumbar spine surgery. Electronically Signed   By: Donavan Foil M.D.   On: 09/01/2016 21:27    Assessment/Plan: Doing well   LOS: 8 days    JONES,DAVID S 09/02/2016, 6:12 PM

## 2016-09-02 NOTE — Care Management Note (Signed)
Case Management Note  Patient Details  Name: Jordon Trullinger Mcnall Sr. MRN: [id number] Date of Birth: [birth year]  Subjective/Objective:                    Action/Plan: Pt s/p OR yesterday. Recommendations are for [facility] services. CM following for d/c needs, physician orders.  Expected Discharge Date:  08/26/16               Expected Discharge Plan:  Home/Self Care  In-House Referral:     Discharge planning Services     Post Acute Care Choice:    Choice offered to:     DME Arranged:    DME Agency:     HH Arranged:    HH Agency:     Status of Service:  In process, will continue to follow  If discussed at Long Length of Stay Meetings, dates discussed:    Additional Comments:  Pollie Friar, RN 09/02/2016, 1:08 PM

## 2016-09-03 LAB — BASIC METABOLIC PANEL
Anion gap: 9 (ref 5–15)
BUN: 23 mg/dL — ABNORMAL HIGH (ref 6–20)
CO2: 27 mmol/L (ref 22–32)
Calcium: 8.6 mg/dL — ABNORMAL LOW (ref 8.9–10.3)
Chloride: 99 mmol/L — ABNORMAL LOW (ref 101–111)
Creatinine, Ser: 1.27 mg/dL — ABNORMAL HIGH (ref 0.61–1.24)
GFR calc Af Amer: >60 mL/min (ref >60)
GFR calc non Af Amer: 56 mL/min — ABNORMAL LOW (ref >60)
Glucose, Bld: 162 mg/dL — ABNORMAL HIGH (ref 65–99)
Potassium: 4.6 mmol/L (ref 3.5–5.1)
Sodium: 135 mmol/L (ref 135–145)

## 2016-09-03 MED ORDER — OXYCODONE-ACETAMINOPHEN 5-325 MG PO TABS: 1.0000 | ORAL_TABLET | Freq: Four times a day (QID) | ORAL | 0 refills | Status: DC | PRN

## 2016-09-03 MED ORDER — METHOCARBAMOL 500 MG PO TABS: 500.0000 mg | ORAL_TABLET | Freq: Four times a day (QID) | ORAL | 1 refills | Status: DC | PRN

## 2016-09-03 NOTE — Progress Notes (Signed)
OT Cancellation Note  Patient Details Name: Dennis Hood Caporale Sr. MRN: [id number] DOB: Aug 02, 1945   Cancelled Treatment:    Reason Eval/Treat Not Completed: Other (comment).  Pt. Reports "ive been through all of this before and have everything I need" (in reference to skilled OT).  Offered to answer any questions or provide return demo of any concerns with ADLS.  Pt. Declined states he is fine and had no need for skilled OT.  Note d/c set for later today.    Janice Coffin, COTA/L 09/03/2016, 10:06 AM

## 2016-09-03 NOTE — Discharge Summary (Signed)
Physician Discharge Summary  Patient ID: Dennis Nations Kunda Sr. MRN: [id number] DOB/AGE: Nov 20, 1945 71 y.o.  Admit date: 08/23/2016 Discharge date: 09/03/2016  Admission Diagnoses: HNP L3-4    Discharge Diagnoses: same   Discharged Condition: good  Hospital Course: The patient was admitted on 08/23/2016 with severe pain, work up reveled a large HNp at L3-4. We tried an ESI and medical mgmt but his pain and functional disabiity led to the need for surgery.  He was taken to the operating room where the patient underwent PLIf L3-4. The patient tolerated the procedure well and was taken to the recovery room and then to the floor in stable condition. The hospital course was routine. There were no complications. The wound remained clean dry and intact. Pt had appropriate back soreness. No complaints of leg pain or new N/T/W. The patient remained afebrile with stable vital signs, and tolerated a regular diet. The patient continued to increase activities, and pain was well controlled with oral pain medications.   Consults: None  Significant Diagnostic Studies:  Results for orders placed or performed during the hospital encounter of 08/23/16  Surgical PCR screen  Result Value Ref Range   MRSA, PCR NEGATIVE NEGATIVE   Staphylococcus aureus NEGATIVE NEGATIVE  Basic metabolic panel  Result Value Ref Range   Sodium 139 135 - 145 mmol/L   Potassium 3.6 3.5 - 5.1 mmol/L   Chloride 104 101 - 111 mmol/L   CO2 27 22 - 32 mmol/L   Glucose, Bld 99 65 - 99 mg/dL   BUN 15 6 - 20 mg/dL   Creatinine, Ser 1.23 0.61 - 1.24 mg/dL   Calcium 9.6 8.9 - 10.3 mg/dL   GFR calc non Af Amer 58 (L) >60 mL/min   GFR calc Af Amer >60 >60 mL/min   Anion gap 8 5 - 15  CBC  Result Value Ref Range   WBC 6.6 4.0 - 10.5 K/uL   RBC 4.91 4.22 - 5.81 MIL/uL   Hemoglobin 14.0 13.0 - 17.0 g/dL   HCT 42.1 39.0 - 52.0 %   MCV 85.7 78.0 - 100.0 fL   MCH 28.5 26.0 - 34.0 pg   MCHC 33.3 30.0 - 36.0 g/dL   RDW 13.9 11.5 - 15.5  %   Platelets 142 (L) 150 - 400 K/uL  Protime-INR  Result Value Ref Range   Prothrombin Time 14.4 11.4 - 15.2 seconds   INR 0000000   Basic metabolic panel  Result Value Ref Range   Sodium 135 135 - 145 mmol/L   Potassium 4.6 3.5 - 5.1 mmol/L   Chloride 99 (L) 101 - 111 mmol/L   CO2 27 22 - 32 mmol/L   Glucose, Bld 162 (H) 65 - 99 mg/dL   BUN 23 (H) 6 - 20 mg/dL   Creatinine, Ser 1.27 (H) 0.61 - 1.24 mg/dL   Calcium 8.6 (L) 8.9 - 10.3 mg/dL   GFR calc non Af Amer 56 (L) >60 mL/min   GFR calc Af Amer >60 >60 mL/min   Anion gap 9 5 - 15  Type and screen  Result Value Ref Range   ABO/RH(D) O POS    Antibody Screen NEG    Sample Expiration 09/04/2016     Dg Lumbar Spine 2-3 Views  Result Date: 09/01/2016 CLINICAL DATA:  Interbody fusion lumbar spine EXAM: DG C-ARM 61-120 MIN; LUMBAR SPINE - 2-3 VIEW COMPARISON:  MRI 08/24/2016, 08/25/2016 FINDINGS: Two low resolution intraoperative spot views of the lumbar spine. Total fluoroscopy time  was 28 seconds. Posterior stabilization rods and fixating screws at L3-L4 with interbody device noted. Interbody device at L4-L5. Single fixating screw on the left at L2 with removal of spinal rod. Two fixating screws at L5 with removal of fixating rods from L4-L5. IMPRESSION: Intraoperative fluoroscopic assistance provided during lumbar spine surgery. Electronically Signed   By: Donavan Foil M.D.   On: 09/01/2016 21:27   Mr Lumbar Spine Wo Contrast  Result Date: 08/24/2016 CLINICAL DATA:  Back surgery x3.  Low back pain. EXAM: MRI LUMBAR SPINE WITHOUT CONTRAST TECHNIQUE: Multiplanar, multisequence MR imaging of the lumbar spine was performed. No intravenous contrast was administered. COMPARISON:  05/13/2016 FINDINGS: Segmentation:  Standard. Alignment:  Physiologic. Vertebrae:  No fracture, evidence of discitis, or bone lesion. Conus medullaris: Extends to the L1 level and appears normal. Paraspinal and other soft tissues: Postsurgical changes in the  posterior paraspinal soft tissues from L2 through L5. Disc levels: Disc spaces: Posterior spinal fusion from L2 through L5. Interbody cage devices at L2-3 and L4-5. T12-L1: No significant disc bulge. No evidence of neural foraminal stenosis. No central canal stenosis. L1-L2: No significant disc bulge. No evidence of neural foraminal stenosis. No central canal stenosis. L2-L3: Interbody fusion. Posterior osseous ridging along the right paracentral aspect narrowing the right lateral recess. No evidence of neural foraminal stenosis. No central canal stenosis. L3-L4: Moderate-sized central disc protrusion with cranial migration of disc material along the left paracentral aspect of the L3 vertebral body with mass effect on the left intraspinal L3 nerve root and L4 nerve root. No evidence of neural foraminal stenosis. No central canal stenosis. L4-L5: Interbody fusion. No evidence of neural foraminal stenosis. No central canal stenosis. L5-S1: Mild broad-based disc bulge. No evidence of neural foraminal stenosis. No central canal stenosis. IMPRESSION: 1. At L3-L4 there is a moderate-sized central disc protrusion with cranial migration of disc material along the left paracentral aspect of the L3 vertebral body with mass effect on the left intraspinal L3 nerve root and L4 nerve root. 2. Posterior spinal fusion from L2 through L5. Interbody cage devices at L2-3 and L4-5. At L2-3 there is posterior osseous ridging along the right paracentral aspect narrowing the right lateral recess. Electronically Signed   By: Kathreen Devoid   On: 08/24/2016 12:05   Ir Epidurography  Result Date: 08/25/2016 CLINICAL DATA:  Lumbosacral spondylosis without myelopathy. Extruded fragment at L3-4, adjacent segment disease. EXAM: LUMBAR INTERLAMINAR EPIDURAL INJECTION FLUOROSCOPY TIME:  0.3 minutes corresponding to a Dose Area Product of 245 Gy*m2 PROCEDURE: Informed written consent was obtained.  Time-out was performed. The overlying skin was  cleansed with betadine soap and anesthetized with 1% lidocaine without epinephrine. An interlaminar approach was performed on the LEFT at L3-4. 20 gauge needle was advanced using loss-of-resistance technique. DIAGNOSTIC/THERAPUETIC EPIDURAL INJECTION: Injection of Isovue 200 shows a good epidural pattern with spread above and below the level of needle placement, primarily on the side of needle placement. No vascular or subarachnoid opacification was seen. 120 mg of Depo-Medrol mixed with 5 cc of 1% Lidocaine were instilled. The procedure was well-tolerated, and the patient was discharged thirty minutes following the injection in good condition. IMPRESSION: Technically successful first lumbar interlaminar epidural injection at L3-4, LEFT. Electronically Signed   By: Staci Righter M.D.   On: 08/25/2016 09:52   Dg C-arm 1-60 Min  Result Date: 09/01/2016 CLINICAL DATA:  Interbody fusion lumbar spine EXAM: DG C-ARM 61-120 MIN; LUMBAR SPINE - 2-3 VIEW COMPARISON:  MRI 08/24/2016, 08/25/2016 FINDINGS:  Two low resolution intraoperative spot views of the lumbar spine. Total fluoroscopy time was 28 seconds. Posterior stabilization rods and fixating screws at L3-L4 with interbody device noted. Interbody device at L4-L5. Single fixating screw on the left at L2 with removal of spinal rod. Two fixating screws at L5 with removal of fixating rods from L4-L5. IMPRESSION: Intraoperative fluoroscopic assistance provided during lumbar spine surgery. Electronically Signed   By: Donavan Foil M.D.   On: 09/01/2016 21:27    Antibiotics:  Anti-infectives    Start     Dose/Rate Route Frequency Ordered Stop   09/02/16 1556  ceFAZolin (ANCEF) IVPB 2g/100 mL premix     2 g 200 mL/hr over 30 Minutes Intravenous Every 8 hours 09/02/16 1557 09/02/16 2357   09/01/16 1824  vancomycin (VANCOCIN) powder  Status:  Discontinued       As needed 09/01/16 1825 09/01/16 1848   09/01/16 1654  bacitracin 50,000 Units in sodium chloride  irrigation 0.9 % 500 mL irrigation  Status:  Discontinued       As needed 09/01/16 1654 09/01/16 1848   09/01/16 1200  ceFAZolin (ANCEF) 3 g in dextrose 5 % 50 mL IVPB     3 g 130 mL/hr over 30 Minutes Intravenous To [facility] 08/31/16 0817 09/01/16 1610   08/23/16 2000  ciprofloxacin (CIPRO) tablet 500 mg     500 mg Oral 2 times daily 08/23/16 1643        Discharge Exam: Blood pressure 133/78, pulse 78, temperature 98.1 F (36.7 C), temperature source Oral, resp. rate 18, height 6\' 7"  (2.007 m), weight (!) 173.7 kg (382 lb 15 oz), SpO2 99 %. Neurologic: Grossly normal Wound ok  Discharge Medications:   Allergies as of 09/03/2016      Reactions   Bee Venom Anaphylaxis   Shrimp [shellfish Allergy] Anaphylaxis, Other (See Comments)   "just shrimp"   Stadol [butorphanol] Anaphylaxis, Other (See Comments)   respiratory  Distress, couldn't breathe, cardiac arrest   Wasp Venom Anaphylaxis      Medication List    TAKE these medications   allopurinol 100 MG tablet Commonly known as:  ZYLOPRIM Take 1 tablet (100 mg total) by mouth 2 (two) times daily.   atorvastatin 80 MG tablet Commonly known as:  LIPITOR Take 1 tablet (80 mg total) by mouth daily. What changed:  how much to take   carvedilol 3.125 MG tablet Commonly known as:  COREG Take 1 tablet (3.125 mg total) by mouth 2 (two) times daily with a meal.   ciprofloxacin 500 MG tablet Commonly known as:  CIPRO Take 1 tablet (500 mg total) by mouth daily with breakfast. What changed:  when to take this   clomiPHENE 50 MG tablet Commonly known as:  CLOMID Half tablet every other day   clopidogrel 75 MG tablet Commonly known as:  PLAVIX Take 1 tablet (75 mg total) by mouth daily.   colchicine 0.6 MG tablet Take 1 tablet (0.6 mg total) by mouth 2 (two) times daily.   EPINEPHrine 0.3 mg/0.3 mL Soaj injection Commonly known as:  EPI-PEN Inject 0.3 mLs (0.3 mg total) into the muscle as needed (allergic  reaction).   escitalopram 10 MG tablet Commonly known as:  LEXAPRO Take 1 tablet (10 mg total) by mouth daily. What changed:  how much to take   furosemide 40 MG tablet Commonly known as:  LASIX Take 1 tablet (40 mg total) by mouth daily.   hydrocortisone 10 MG tablet Commonly known as:  CORTEF Take 1 tablet (10 mg total) by mouth 2 (two) times daily.   indomethacin 50 MG capsule Commonly known as:  INDOCIN Take 1 capsule (50 mg total) by mouth 2 (two) times daily with a meal.   levothyroxine 175 MCG tablet Commonly known as:  SYNTHROID Take 1 tablet (175 mcg total) by mouth daily before breakfast. What changed:  how much to take   losartan 100 MG tablet Commonly known as:  COZAAR Take 1 tablet (100 mg total) by mouth daily.   methocarbamol 500 MG tablet Commonly known as:  ROBAXIN Take 1 tablet (500 mg total) by mouth every 6 (six) hours as needed for muscle spasms.   oxyCODONE-acetaminophen 5-325 MG tablet Commonly known as:  PERCOCET/ROXICET Take 1-2 tablets by mouth every 6 (six) hours as needed for moderate pain. What changed:  how much to take   polyethylene glycol packet Commonly known as:  MIRALAX / GLYCOLAX Take 17 g by mouth daily as needed for mild constipation.   potassium chloride SA 20 MEQ tablet Commonly known as:  K-DUR,KLOR-CON Take 1 tablet (20 mEq total) by mouth daily. What changed:  when to take this   spironolactone 100 MG tablet Commonly known as:  ALDACTONE Take 100 mg by mouth daily.   testosterone 4 MG/24HR Pt24 patch Commonly known as:  ANDRODERM Place 1 patch onto the skin daily. What changed:  when to take this            Durable Medical Equipment        Start     Ordered   09/02/16 1557  DME 3 n 1  Once     09/02/16 1557   09/02/16 1557  DME Walker rolling  Once    Question:  Patient needs a walker to treat with the following condition  Answer:  S/P lumbar spinal fusion   09/02/16 1557      Disposition:  home   Final Dx: PLIF L3-4  Discharge Instructions     Remove dressing in 72 hours    Complete by:  As directed    Call MD for:  difficulty breathing, headache or visual disturbances    Complete by:  As directed    Call MD for:  persistant nausea and vomiting    Complete by:  As directed    Call MD for:  redness, tenderness, or signs of infection (pain, swelling, redness, odor or green/yellow discharge around incision site)    Complete by:  As directed    Call MD for:  severe uncontrolled pain    Complete by:  As directed    Call MD for:  temperature >100.4    Complete by:  As directed    Diet - low sodium heart healthy    Complete by:  As directed    Increase activity slowly    Complete by:  As directed    Remove dressing in 24 hours    Complete by:  As directed       Follow-up Information    Evola Hollis S, MD. Schedule an appointment as soon as possible for a visit in 3 week(s).   Specialty:  Neurosurgery Contact information: [address] [place] [address] [place] [address] [phone]        Eustace Moore, MD .   Specialty:  Neurosurgery Contact information: [address] [place] [address] [phone]            Signed: Eustace Moore 09/03/2016, 8:41 AM

## 2016-09-03 NOTE — Care Management Note (Signed)
Case Management Note  Patient Details  Name: Dennis Livingood Kadow Sr. MRN: [id number] Date of Birth: 1945-08-15  Subjective/Objective:                 Spoke with patient at the bedside, he states this is his "fourth time go around with this." He states he has all of the DME at home already and declined [facility] stating he was going to the beach this Wednesday and didn't really think he needed HH.   Action/Plan:  No further CM needs identified.  Expected Discharge Date:  09/03/16               Expected Discharge Plan:  Home/Self Care  In-House Referral:     Discharge planning Services  CM Consult  Post Acute Care Choice:    Choice offered to:  Patient  DME Arranged:    DME Agency:     HH Arranged:  Patient Refused [place] Agency:     Status of Service:  Completed, signed off  If discussed at [employer] of Stay Meetings, dates discussed:    Additional Comments:  Carles Collet, RN 09/03/2016, 9:36 AM

## 2016-09-03 NOTE — Progress Notes (Signed)
Patient discharged home without home health services or equipment per case management as patient has equipment at home.  Discharge information given. IV discontinued. Patient questions asked and answered. Patient transported from unit via wheelchair with nurse techs. Wendee Copp

## 2016-09-03 NOTE — Progress Notes (Signed)
PT Cancellation Note  Patient Details Name: Dennis Fullbright Santizo Sr. MRN: [id number] DOB: May 28, 1946   Cancelled Treatment:    Reason Eval/Treat Not Completed: Other (comment) (Patient declined - preparing for d/c) Patient declined PT session today, preparing for d/c.  Reports he has no further PT needs.  Dennis [name] 09/03/2016, 11:20 AM Dennis [name]. Dennis [name], [place] Pager [phone]

## 2016-09-13 ENCOUNTER — Telehealth: Payer: Self-pay | Admitting: *Deleted

## 2016-09-13 NOTE — Telephone Encounter (Signed)
Spoke with patient to reschedule appointment in [facility] to increase tachy detection rate to 170bpm on ILR.  Frequent sinus tach episodes detected as "tachy" episodes on device.  Patient is agreeable to appointment on 10/04/16 at 2:00pm.  He is appreciative of call and denies additional questions or concerns at this time.

## 2016-09-23 ENCOUNTER — Ambulatory Visit (INDEPENDENT_AMBULATORY_CARE_PROVIDER_SITE_OTHER): Payer: Medicare Other | Admitting: *Deleted

## 2016-09-23 DIAGNOSIS — I639 Cerebral infarction, unspecified: Secondary | ICD-10-CM | POA: Diagnosis not present

## 2016-09-26 NOTE — Progress Notes (Signed)
Carelink Summary Report / Loop Recorder 

## 2016-10-01 LAB — CUP PACEART REMOTE DEVICE CHECK
Date Time Interrogation Session: 20180316223651
Implantable Pulse Generator Implant Date: 20170719

## 2016-10-01 NOTE — Progress Notes (Signed)
[facility] summary report received. Battery status OK. Normal device function. No new symptom episodes, brady, or pause episodes. No new AF episodes. 1 tachy episode- ECG appears ST/1:1. Monthly summary reports and ROV/PRN

## 2016-10-04 ENCOUNTER — Ambulatory Visit (INDEPENDENT_AMBULATORY_CARE_PROVIDER_SITE_OTHER): Payer: Medicare Other | Admitting: *Deleted

## 2016-10-04 DIAGNOSIS — Z4509 Encounter for adjustment and management of other cardiac device: Secondary | ICD-10-CM

## 2016-10-04 DIAGNOSIS — I639 Cerebral infarction, unspecified: Secondary | ICD-10-CM

## 2016-10-04 LAB — CUP PACEART INCLINIC DEVICE CHECK
Date Time Interrogation Session: 20180327144443
Implantable Pulse Generator Implant Date: 20170719

## 2016-10-04 NOTE — Progress Notes (Signed)
Loop check in clinic. Battery status: good. R-waves 0.26mV. No symptom or AF episodes. Pause and brady detection off. 7 tachy episodes--ECGs appear ST. Per GT, increased tachy detection interval to 344ms (171bpm) and extended tachy duration to 48 beats. Monthly summary reports and ROV with GT PRN.

## 2016-10-05 ENCOUNTER — Ambulatory Visit (INDEPENDENT_AMBULATORY_CARE_PROVIDER_SITE_OTHER): Payer: Medicare Other

## 2016-10-05 ENCOUNTER — Encounter: Payer: Self-pay | Admitting: Family Medicine

## 2016-10-05 ENCOUNTER — Ambulatory Visit (INDEPENDENT_AMBULATORY_CARE_PROVIDER_SITE_OTHER): Payer: Medicare Other | Admitting: Family Medicine

## 2016-10-05 VITALS — BP 122/64 | HR 72 | Temp 97.5°F | Ht 79.0 in | Wt 377.0 lb

## 2016-10-05 VITALS — BP 122/64 | HR 72 | Resp 18 | Wt 377.0 lb

## 2016-10-05 DIAGNOSIS — E7800 Pure hypercholesterolemia, unspecified: Secondary | ICD-10-CM

## 2016-10-05 DIAGNOSIS — G8929 Other chronic pain: Secondary | ICD-10-CM

## 2016-10-05 DIAGNOSIS — M549 Dorsalgia, unspecified: Secondary | ICD-10-CM | POA: Diagnosis not present

## 2016-10-05 DIAGNOSIS — I10 Essential (primary) hypertension: Secondary | ICD-10-CM | POA: Diagnosis not present

## 2016-10-05 DIAGNOSIS — I639 Cerebral infarction, unspecified: Secondary | ICD-10-CM | POA: Diagnosis not present

## 2016-10-05 DIAGNOSIS — Z23 Encounter for immunization: Secondary | ICD-10-CM

## 2016-10-05 DIAGNOSIS — E271 Primary adrenocortical insufficiency: Secondary | ICD-10-CM | POA: Diagnosis not present

## 2016-10-05 DIAGNOSIS — E039 Hypothyroidism, unspecified: Secondary | ICD-10-CM | POA: Diagnosis not present

## 2016-10-05 DIAGNOSIS — Z0000 Encounter for general adult medical examination without abnormal findings: Secondary | ICD-10-CM | POA: Diagnosis not present
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
## 2016-10-05 NOTE — Patient Instructions (Signed)
Dennis [name] , Thank you for taking time to come for your Medicare Wellness Visit. I appreciate your ongoing commitment to your health goals. Please review the following plan we discussed and let me know if I can assist you in the future.   Screening recommendations/referrals: Colonoscopy: done 07/11/2012 Recommended yearly ophthalmology/optometry visit for glaucoma screening and checkup Recommended yearly dental visit for hygiene and checkup  Vaccinations: Influenza vaccine: declined Pneumococcal vaccine: completed series Tdap vaccine: done 10/01/09   Advanced directives: Requested copy  Next appointment: None  Preventive Care 90 Years and Older, Male Preventive care refers to lifestyle choices and visits with your health care provider that can promote health and wellness. What does preventive care include?  A yearly physical exam. This is also called an annual well check.  Dental exams once or twice a year.  Routine eye exams. Ask your health care provider how often you should have your eyes checked.  Personal lifestyle choices, including:  Daily care of your teeth and gums.  Regular physical activity.  Eating a healthy diet.  Avoiding tobacco and drug use.  Limiting alcohol use.  Practicing safe sex.  Taking low doses of aspirin every day.  Taking vitamin and mineral supplements as recommended by your health care provider. What happens during an annual well check? The services and screenings done by your health care provider during your annual well check will depend on your age, overall health, lifestyle risk factors, and family history of disease. Counseling  Your health care provider may ask you questions about your:  Alcohol use.  Tobacco use.  Drug use.  Emotional well-being.  Home and relationship well-being.  Sexual activity.  Eating habits.  History of falls.  Memory and ability to understand (cognition).  Work and work Statistician. Screening    You may have the following tests or measurements:  Height, weight, and BMI.  Blood pressure.  Lipid and cholesterol levels. These may be checked every 5 years, or more frequently if you are over 57 years old.  Skin check.  Lung cancer screening. You may have this screening every year starting at age 8 if you have a 30-pack-year history of smoking and currently smoke or have quit within the past 15 years.  Fecal occult blood test (FOBT) of the stool. You may have this test every year starting at age 81.  Flexible sigmoidoscopy or colonoscopy. You may have a sigmoidoscopy every 5 years or a colonoscopy every 10 years starting at age 73.  Prostate cancer screening. Recommendations will vary depending on your family history and other risks.  Hepatitis C blood test.  Hepatitis B blood test.  Sexually transmitted disease (STD) testing.  Diabetes screening. This is done by checking your blood sugar (glucose) after you have not eaten for a while (fasting). You may have this done every 1-3 years.  Abdominal aortic aneurysm (AAA) screening. You may need this if you are a current or former smoker.  Osteoporosis. You may be screened starting at age 46 if you are at high risk. Talk with your health care provider about your test results, treatment options, and if necessary, the need for more tests. Vaccines  Your health care provider may recommend certain vaccines, such as:  Influenza vaccine. This is recommended every year.  Tetanus, diphtheria, and acellular pertussis (Tdap, Td) vaccine. You may need a Td booster every 10 years.  Zoster vaccine. You may need this after age 18.  Pneumococcal 13-valent conjugate (PCV13) vaccine. One dose is recommended after  age 23.  Pneumococcal polysaccharide (PPSV23) vaccine. One dose is recommended after age 67. Talk to your health care provider about which screenings and vaccines you need and how often you need them. This information is not  intended to replace advice given to you by your health care provider. Make sure you discuss any questions you have with your health care provider. Document Released: 07/24/2015 Document Revised: 03/16/2016 Document Reviewed: 04/28/2015 Elsevier Interactive Patient Education  2017 [place] Prevention in the Home Falls can cause injuries. They can happen to people of all ages. There are many things you can do to make your home safe and to help prevent falls. What can I do on the outside of my home?  Regularly fix the edges of walkways and driveways and fix any cracks.  Remove anything that might make you trip as you walk through a door, such as a raised step or threshold.  Trim any bushes or trees on the path to your home.  Use bright outdoor lighting.  Clear any walking paths of anything that might make someone trip, such as rocks or tools.  Regularly check to see if handrails are loose or broken. Make sure that both sides of any steps have handrails.  Any raised decks and porches should have guardrails on the edges.  Have any leaves, snow, or ice cleared regularly.  Use sand or salt on walking paths during winter.  Clean up any spills in your garage right away. This includes oil or grease spills. What can I do in the bathroom?  Use night lights.  Install grab bars by the toilet and in the tub and shower. Do not use towel bars as grab bars.  Use non-skid mats or decals in the tub or shower.  If you need to sit down in the shower, use a plastic, non-slip stool.  Keep the floor dry. Clean up any water that spills on the floor as soon as it happens.  Remove soap buildup in the tub or shower regularly.  Attach bath mats securely with double-sided non-slip rug tape.  Do not have throw rugs and other things on the floor that can make you trip. What can I do in the bedroom?  Use night lights.  Make sure that you have a light by your bed that is easy to reach.  Do  not use any sheets or blankets that are too big for your bed. They should not hang down onto the floor.  Have a firm chair that has side arms. You can use this for support while you get dressed.  Do not have throw rugs and other things on the floor that can make you trip. What can I do in the kitchen?  Clean up any spills right away.  Avoid walking on wet floors.  Keep items that you use a lot in easy-to-reach places.  If you need to reach something above you, use a strong step stool that has a grab bar.  Keep electrical cords out of the way.  Do not use floor polish or wax that makes floors slippery. If you must use wax, use non-skid floor wax.  Do not have throw rugs and other things on the floor that can make you trip. What can I do with my stairs?  Do not leave any items on the stairs.  Make sure that there are handrails on both sides of the stairs and use them. Fix handrails that are broken or loose. Make sure that  handrails are as long as the stairways.  Check any carpeting to make sure that it is firmly attached to the stairs. Fix any carpet that is loose or worn.  Avoid having throw rugs at the top or bottom of the stairs. If you do have throw rugs, attach them to the floor with carpet tape.  Make sure that you have a light switch at the top of the stairs and the bottom of the stairs. If you do not have them, ask someone to add them for you. What else can I do to help prevent falls?  Wear shoes that:  Do not have high heels.  Have rubber bottoms.  Are comfortable and fit you well.  Are closed at the toe. Do not wear sandals.  If you use a stepladder:  Make sure that it is fully opened. Do not climb a closed stepladder.  Make sure that both sides of the stepladder are locked into place.  Ask someone to hold it for you, if possible.  Clearly mark and make sure that you can see:  Any grab bars or handrails.  First and last steps.  Where the edge of each  step is.  Use tools that help you move around (mobility aids) if they are needed. These include:  Canes.  Walkers.  Scooters.  Crutches.  Turn on the lights when you go into a dark area. Replace any light bulbs as soon as they burn out.  Set up your furniture so you have a clear path. Avoid moving your furniture around.  If any of your floors are uneven, fix them.  If there are any pets around you, be aware of where they are.  Review your medicines with your doctor. Some medicines can make you feel dizzy. This can increase your chance of falling. Ask your doctor what other things that you can do to help prevent falls. This information is not intended to replace advice given to you by your health care provider. Make sure you discuss any questions you have with your health care provider. Document Released: 04/23/2009 Document Revised: 12/03/2015 Document Reviewed: 08/01/2014 Elsevier Interactive Patient Education  2017 [employer].

## 2016-10-05 NOTE — Progress Notes (Signed)
Subjective:  HPI Pt is here for a 6 month follow up on chronic problems. He had a Lumbar Fusion on L 3-4 on 09/01/16. He reports that he is doing fairly well since the surgery. He is walking. He reports that he his Addison's disease is a little off because he is shaking today. He sees endocrinology for this.   Prior to Admission medications   Medication Sig Start Date End Date Taking? Authorizing Provider  allopurinol (ZYLOPRIM) 100 MG tablet Take 1 tablet (100 mg total) by mouth 2 (two) times daily. 04/07/16  Yes Heywood Tokunaga Maceo Pro., MD  atorvastatin (LIPITOR) 20 MG tablet  07/20/16  Yes Historical Provider, MD  atorvastatin (LIPITOR) 80 MG tablet Take 1 tablet (80 mg total) by mouth daily. Patient taking differently: Take 20 mg by mouth daily.  04/07/16  Yes Kejuan Bekker Maceo Pro., MD  carvedilol (COREG) 3.125 MG tablet Take 1 tablet (3.125 mg total) by mouth 2 (two) times daily with a meal. 04/07/16  Yes Jerrol Banana., MD  ciprofloxacin (CIPRO) 500 MG tablet Take 1 tablet (500 mg total) by mouth daily with breakfast. 04/07/16  Yes Jerrol Banana., MD  clomiPHENE (CLOMID) 50 MG tablet Half tablet every other day 06/09/16  Yes Elayne Snare, MD  clopidogrel (PLAVIX) 75 MG tablet Take 1 tablet (75 mg total) by mouth daily. 04/07/16  Yes Kanisha Duba Maceo Pro., MD  colchicine 0.6 MG tablet Take 1 tablet (0.6 mg total) by mouth 2 (two) times daily. 04/07/16  Yes Sanjuanita Condrey Maceo Pro., MD  EPINEPHrine 0.3 mg/0.3 mL IJ SOAJ injection Inject 0.3 mLs (0.3 mg total) into the muscle as needed (allergic reaction). 12/15/15  Yes Sevrin Sally Maceo Pro., MD  escitalopram (LEXAPRO) 10 MG tablet Take 1 tablet (10 mg total) by mouth daily. Patient taking differently: Take 20 mg by mouth daily.  04/07/16  Yes Avanti Jetter Maceo Pro., MD  furosemide (LASIX) 40 MG tablet Take 1 tablet (40 mg total) by mouth daily. 04/07/16  Yes Meliana Canner Maceo Pro., MD  hydrocortisone (CORTEF) 10 MG tablet Take 1 tablet (10 mg  total) by mouth 2 (two) times daily. 04/07/16  Yes Jahad Old Maceo Pro., MD  indomethacin (INDOCIN) 50 MG capsule Take 1 capsule (50 mg total) by mouth 2 (two) times daily with a meal. 04/07/16  Yes Jerrol Banana., MD  levothyroxine (SYNTHROID) 175 MCG tablet Take 1 tablet (175 mcg total) by mouth daily before breakfast. Patient taking differently: Take 200 mcg by mouth daily before breakfast.  06/09/16  Yes Elayne Snare, MD  levothyroxine (SYNTHROID, LEVOTHROID) 200 MCG tablet  09/12/16  Yes Historical Provider, MD  losartan (COZAAR) 100 MG tablet Take 1 tablet (100 mg total) by mouth daily. 04/07/16  Yes Leonte Horrigan Maceo Pro., MD  methocarbamol (ROBAXIN) 500 MG tablet Take 1 tablet (500 mg total) by mouth every 6 (six) hours as needed for muscle spasms. 09/03/16  Yes Eustace Moore, MD  oxyCODONE-acetaminophen (PERCOCET/ROXICET) 5-325 MG tablet Take 1-2 tablets by mouth every 6 (six) hours as needed for moderate pain. 09/03/16  Yes Eustace Moore, MD  oxyCODONE-acetaminophen (ROXICET) 5-325 MG tablet Take 1-2 tablets by mouth every 6 (six) hours as needed. 09/03/16  Yes Vincent J Costella, PA-C  polyethylene glycol (MIRALAX / GLYCOLAX) packet Take 17 g by mouth daily as needed for mild constipation.  08/15/15  Yes Historical Provider, MD  potassium chloride SA (K-DUR,KLOR-CON) 20 MEQ tablet Take 1 tablet (  20 mEq total) by mouth daily. Patient taking differently: Take 20 mEq by mouth 2 (two) times daily.  04/07/16  Yes Michelangelo Rindfleisch Maceo Pro., MD  spironolactone (ALDACTONE) 100 MG tablet Take 100 mg by mouth daily.   Yes Historical Provider, MD  testosterone (ANDRODERM) 4 MG/24HR PT24 patch Place 1 patch onto the skin daily. Patient taking differently: Place 1 patch onto the skin at bedtime.  06/09/16  Yes Elayne Snare, MD  tiZANidine (ZANAFLEX) 4 MG tablet TK 1 T PO TID PRN 09/23/16  Yes Historical Provider, MD    Patient Active Problem List   Diagnosis Date Noted  . Back pain 08/23/2016  .  Hypogonadotropic hypogonadism ([place]) 06/09/2016  . Adrenal insufficiency ([place]) 03/18/2016  . Vertigo 03/17/2016  . AKI (acute kidney injury) ([place])   . Stroke (cerebrum) ([place])   . HLD (hyperlipidemia)   . Essential hypertension   . Hypotension due to drugs   . Palpitations   . Right sided weakness 01/21/2016  . Acquired hypothyroidism 11/04/2015  . Arthritis 11/04/2015  . Decreased libido 11/04/2015  . Narrowing of intervertebral disc space 11/04/2015  . Major depressive disorder, single episode, mild ([place]) 11/04/2015  . H/o Lyme disease 11/04/2015  . Hypercholesterolemia 11/04/2015  . Hypotestosteronism 11/04/2015  . Morbid obesity ([place]) 11/04/2015  . Temporary cerebral vascular dysfunction 11/04/2015  . Chronic tophaceous gout 09/21/2015  . Blood glucose elevated 06/02/2015  . Benign prostatic hyperplasia with urinary obstruction 09/26/2014  . OSA on CPAP 08/03/2014  . Diastolic CHF ([place]) 75/17/0017  . CAD (coronary artery disease) 08/02/2014  . Bursitis, trochanteric 05/15/2014  . S/P lumbar spinal fusion 07/17/2013  . Chronic back pain     Past Medical History:  Diagnosis Date  . Acute CVA (cerebrovascular accident) ([place]) 01/22/2016  . Arthritis    low back - DDD  . Cancer [facility])    Testicular Cancer  . Cellulitis of scrotum   . Cellulitis, scrotum 08/02/2014  . CHF (congestive heart failure) ([place])   . Chronic lower back pain    "from [place] 2007"  . Complication of anesthesia    Sometimes has N&V /w anesth.   . Coronary artery disease    Cath 2001  . Elevated PSA   . Epididymitis, left 08/04/2014  . History of chronic bronchitis   . History of gout   . Hypertension   . Hypocholesteremia   . Hypothyroidism   . Kidney stone   . Myocardial infarction 2001   2001- cardiac cath., cardiac clearanece note dr Otho Perl 05-14-13 on chart, stress test results 02-21-12 on chart  . OSA on CPAP    cpap setting of 10  . Pneumonia 2000's and 2013  . PONV (postoperative nausea and  vomiting)   . [facility] spotted fever   . Stroke [facility]) 2004   "right brain stem; no residual "    Social History   Social History  . Marital status: Married    Spouse name: N/A  . Number of children: 1  . Years of education: N/A   Occupational History  . Retired-works at [employer] History Main Topics  . Smoking status: Never Smoker  . Smokeless tobacco: Never Used  . Alcohol use Yes     Comment: " I drink wine about a year ago" 08/07/15  . Drug use: No  . Sexual activity: No   Other Topics Concern  . Not on file   Social History Narrative  . No narrative on file  Allergies  Allergen Reactions  . Bee Venom Anaphylaxis  . Shrimp [Shellfish Allergy] Anaphylaxis and Other (See Comments)    "just shrimp"  . Stadol [Butorphanol] Anaphylaxis and Other (See Comments)    respiratory  Distress, couldn't breathe, cardiac arrest  . Wasp Venom Anaphylaxis    Review of Systems  Constitutional: Negative.   HENT: Negative.   Eyes: Negative.   Respiratory: Negative.   Cardiovascular: Negative.   Gastrointestinal: Negative.   Genitourinary: Negative.   Musculoskeletal: Positive for back pain.  Skin: Negative.   Neurological: Positive for tremors.  Endo/Heme/Allergies: Negative.   Psychiatric/Behavioral: Negative.     Immunization History  Administered Date(s) Administered  . Pneumococcal Conjugate-13 06/10/2014  . Pneumococcal-Unspecified 03/11/2010  . Tdap 10/01/2009    Objective:  BP 122/64 (BP Location: Left Arm, Patient Position: Sitting, Cuff Size: Large)   Pulse 72   Resp 18   Wt (!) 377 lb (171 kg)   SpO2 95%   BMI 42.47 kg/m   Physical Exam  Constitutional: He is oriented to person, place, and time and well-developed, well-nourished, and in no distress.  Eyes: Conjunctivae and EOM are normal. Pupils are equal, round, and reactive to light.  Neck: Normal range of motion. Neck supple.  Cardiovascular: Normal rate, regular rhythm, normal  heart sounds and intact distal pulses.   Pulmonary/Chest: Effort normal and breath sounds normal.  Abdominal: Soft. Bowel sounds are normal.  Musculoskeletal: Normal range of motion. He exhibits edema.  Trace.  Neurological: He is alert and oriented to person, place, and time. He has normal reflexes. Gait normal. GCS score is 15.  Skin: Skin is warm and dry.  Psychiatric: Mood, memory, affect and judgment normal.    Lab Results  Component Value Date   WBC 6.6 08/23/2016   HGB 14.0 08/23/2016   HCT 42.1 08/23/2016   PLT 142 (L) 08/23/2016   GLUCOSE 162 (H) 09/03/2016   CHOL 220 (H) 01/22/2016   TRIG 180 (H) 01/22/2016   HDL 35 (L) 01/22/2016   LDLCALC 149 (H) 01/22/2016   TSH 0.14 (L) 06/06/2016   INR 1.11 08/23/2016   HGBA1C 5.8 (H) 01/22/2016    CMP     Component Value Date/Time   NA 135 09/03/2016 0248   NA 128 (A) 06/02/2015   NA 139 08/11/2012 1640   K 4.6 09/03/2016 0248   K 3.6 08/11/2012 1640   CL 99 (L) 09/03/2016 0248   CL 102 08/11/2012 1640   CO2 27 09/03/2016 0248   CO2 30 08/11/2012 1640   GLUCOSE 162 (H) 09/03/2016 0248   GLUCOSE 85 08/11/2012 1640   BUN 23 (H) 09/03/2016 0248   BUN 12 06/02/2015   BUN 14 08/11/2012 1640   CREATININE 1.27 (H) 09/03/2016 0248   CREATININE 0.98 08/11/2012 1640   CALCIUM 8.6 (L) 09/03/2016 0248   CALCIUM 9.2 08/11/2012 1640   PROT 7.4 03/17/2016 1709   PROT 8.6 (H) 08/11/2012 1640   ALBUMIN 4.4 03/17/2016 1709   ALBUMIN 4.5 08/11/2012 1640   AST 17 03/17/2016 1709   AST 20 08/11/2012 1640   ALT 18 03/17/2016 1709   ALT 25 08/11/2012 1640   ALKPHOS 53 03/17/2016 1709   ALKPHOS 91 08/11/2012 1640   BILITOT 1.4 (H) 03/17/2016 1709   BILITOT 1.3 (H) 08/11/2012 1640   GFRNONAA 56 (L) 09/03/2016 0248   GFRNONAA >60 08/11/2012 1640   GFRAA >60 09/03/2016 0248   GFRAA >60 08/11/2012 1640    Assessment and Plan :  1.  Hypercholesterolemia Lipids ordered.  2. Addison's disease ([place]) Per dr Dwyane Dee.  3. Acquired  hypothyroidism   4. Essential hypertension   5. Chronic back pain, unspecified back location, unspecified back pain laterality   6. Morbid obesity ([place])  7.CAD Per Dr Serita Sheller. 8.h/o CVA   HPI, Exam, and A&P Transcribed under the direction and in the presence of Breawna Montenegro L. Cranford Mon, MD  Electronically Signed: Katina Dung, CMA I have done the exam and reviewed the chart and it is accurate to the best of my knowledge. Development worker, community has been used and  any errors in dictation or transcription are unintentional. Miguel Aschoff M.D. [facility] 10/05/2016 2:07 PM

## 2016-10-06 ENCOUNTER — Emergency Department (HOSPITAL_COMMUNITY)
Admission: EM | Admit: 2016-10-06 | Discharge: 2016-10-06 | Disposition: A | Discharge status: Home / Self Care | Payer: Medicare Other | Attending: Emergency Medicine | Admitting: Emergency Medicine

## 2016-10-06 ENCOUNTER — Emergency Department (HOSPITAL_COMMUNITY): Payer: Medicare Other

## 2016-10-06 ENCOUNTER — Encounter (HOSPITAL_COMMUNITY): Payer: Self-pay

## 2016-10-06 DIAGNOSIS — I252 Old myocardial infarction: Secondary | ICD-10-CM | POA: Diagnosis not present

## 2016-10-06 DIAGNOSIS — Z8547 Personal history of malignant neoplasm of testis: Secondary | ICD-10-CM | POA: Diagnosis not present

## 2016-10-06 DIAGNOSIS — I2510 Atherosclerotic heart disease of native coronary artery without angina pectoris: Secondary | ICD-10-CM | POA: Insufficient documentation

## 2016-10-06 DIAGNOSIS — N1330 Unspecified hydronephrosis: Secondary | ICD-10-CM | POA: Diagnosis not present

## 2016-10-06 DIAGNOSIS — N200 Calculus of kidney: Secondary | ICD-10-CM | POA: Diagnosis not present

## 2016-10-06 DIAGNOSIS — E039 Hypothyroidism, unspecified: Secondary | ICD-10-CM | POA: Insufficient documentation

## 2016-10-06 DIAGNOSIS — I509 Heart failure, unspecified: Secondary | ICD-10-CM | POA: Diagnosis not present

## 2016-10-06 DIAGNOSIS — Z79899 Other long term (current) drug therapy: Secondary | ICD-10-CM | POA: Insufficient documentation

## 2016-10-06 DIAGNOSIS — I110 Hypertensive heart disease with heart failure: Secondary | ICD-10-CM | POA: Insufficient documentation

## 2016-10-06 DIAGNOSIS — Z96652 Presence of left artificial knee joint: Secondary | ICD-10-CM | POA: Diagnosis not present

## 2016-10-06 DIAGNOSIS — N132 Hydronephrosis with renal and ureteral calculous obstruction: Secondary | ICD-10-CM | POA: Diagnosis not present

## 2016-10-06 DIAGNOSIS — R109 Unspecified abdominal pain: Secondary | ICD-10-CM | POA: Diagnosis present

## 2016-10-06 LAB — HEPATIC FUNCTION PANEL
ALT: 21 U/L (ref 17–63)
AST: 18 U/L (ref 15–41)
Albumin: 4.5 g/dL (ref 3.5–5.0)
Alkaline Phosphatase: 89 U/L (ref 38–126)
Bilirubin, Direct: 0.1 mg/dL (ref 0.1–0.5)
Indirect Bilirubin: 0.9 mg/dL (ref 0.3–0.9)
Total Bilirubin: 1 mg/dL (ref 0.3–1.2)
Total Protein: 8 g/dL (ref 6.5–8.1)

## 2016-10-06 LAB — URINALYSIS, ROUTINE W REFLEX MICROSCOPIC
Bacteria, UA: NONE SEEN
Bilirubin Urine: NEGATIVE
Glucose, UA: NEGATIVE mg/dL
Ketones, ur: NEGATIVE mg/dL
Leukocytes, UA: NEGATIVE
Nitrite: NEGATIVE
Protein, ur: NEGATIVE mg/dL
Specific Gravity, Urine: 1.018 (ref 1.005–1.030)
pH: 5 (ref 5.0–8.0)

## 2016-10-06 LAB — CBC
HCT: 45.6 % (ref 39.0–52.0)
Hemoglobin: 15.5 g/dL (ref 13.0–17.0)
MCH: 29.2 pg (ref 26.0–34.0)
MCHC: 34 g/dL (ref 30.0–36.0)
MCV: 85.9 fL (ref 78.0–100.0)
Platelets: 200 10*3/uL (ref 150–400)
RBC: 5.31 MIL/uL (ref 4.22–5.81)
RDW: 13.4 % (ref 11.5–15.5)
WBC: 9.7 10*3/uL (ref 4.0–10.5)

## 2016-10-06 LAB — BASIC METABOLIC PANEL
Anion gap: 12 (ref 5–15)
BUN: 23 mg/dL — ABNORMAL HIGH (ref 6–20)
CO2: 25 mmol/L (ref 22–32)
Calcium: 10.1 mg/dL (ref 8.9–10.3)
Chloride: 100 mmol/L — ABNORMAL LOW (ref 101–111)
Creatinine, Ser: 1.62 mg/dL — ABNORMAL HIGH (ref 0.61–1.24)
GFR calc Af Amer: 48 mL/min — ABNORMAL LOW (ref >60)
GFR calc non Af Amer: 41 mL/min — ABNORMAL LOW (ref >60)
Glucose, Bld: 137 mg/dL — ABNORMAL HIGH (ref 65–99)
Potassium: 4.2 mmol/L (ref 3.5–5.1)
Sodium: 137 mmol/L (ref 135–145)

## 2016-10-06 MED ORDER — KETOROLAC TROMETHAMINE 30 MG/ML IJ SOLN
15.0000 mg | Freq: Once | INTRAMUSCULAR | Status: AC
  Administered 2016-10-06: 15 mg via INTRAVENOUS
  Filled 2016-10-06: qty 1

## 2016-10-06 MED ORDER — SODIUM CHLORIDE 0.9 % IV BOLUS (SEPSIS)
1000.0000 mL | Freq: Once | INTRAVENOUS | Status: AC
  Administered 2016-10-06: 1000 mL via INTRAVENOUS

## 2016-10-06 MED ORDER — TAMSULOSIN HCL 0.4 MG PO CAPS: 0.4000 mg | ORAL_CAPSULE | Freq: Every day | ORAL | 0 refills | Status: DC

## 2016-10-06 MED ORDER — HYDROMORPHONE HCL 1 MG/ML IJ SOLN
1.0000 mg | Freq: Once | INTRAMUSCULAR | Status: AC
  Administered 2016-10-06: 1 mg via INTRAVENOUS
  Filled 2016-10-06: qty 1

## 2016-10-06 MED ORDER — ONDANSETRON HCL 4 MG/2ML IJ SOLN
4.0000 mg | Freq: Once | INTRAMUSCULAR | Status: AC
  Administered 2016-10-06: 4 mg via INTRAVENOUS
  Filled 2016-10-06: qty 2

## 2016-10-06 MED ORDER — OXYCODONE-ACETAMINOPHEN 5-325 MG PO TABS: ORAL_TABLET | ORAL | 0 refills | Status: DC

## 2016-10-06 MED ORDER — ONDANSETRON 4 MG PO TBDP: ORAL_TABLET | ORAL | 0 refills | Status: DC

## 2016-10-06 NOTE — ED Notes (Signed)
Patient transported to CT 
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
## 2016-10-06 NOTE — Discharge Instructions (Signed)
Follow up with [facility] in 2 weeks.  Get seen sooner if fever vomiting uncontrolled or pain not controlled

## 2016-10-06 NOTE — ED Triage Notes (Signed)
Per Pt, Pt is coming from home with sharp, shooting left flank pain that started at 2300 last night. Pt reports Hx of Kidney stones. Denies burning with urination. Reports nausea and vomiting since last night.

## 2016-10-06 NOTE — ED Notes (Signed)
Pt is in stable condition upon d/c and ambulates from ED. 

## 2016-10-20 ENCOUNTER — Telehealth: Payer: Self-pay | Admitting: Cardiovascular Disease

## 2016-10-20 NOTE — Telephone Encounter (Deleted)
Dennis [name] called from [facility] [phone]) asking if Dr.Nishan was available to sign death certificate. I called Dennis [name] back he is aware Dr.Nishan out of office Until Monday 10/24/16. He will bring the d/c by today so I can have available for first thing Monday morning.

## 2016-10-21 DIAGNOSIS — R319 Hematuria, unspecified: Secondary | ICD-10-CM | POA: Diagnosis not present

## 2016-10-21 DIAGNOSIS — N390 Urinary tract infection, site not specified: Secondary | ICD-10-CM | POA: Diagnosis not present

## 2016-10-24 ENCOUNTER — Encounter: Payer: Self-pay | Admitting: Endocrinology

## 2016-10-24 ENCOUNTER — Ambulatory Visit (INDEPENDENT_AMBULATORY_CARE_PROVIDER_SITE_OTHER): Payer: Medicare Other | Admitting: *Deleted

## 2016-10-24 ENCOUNTER — Ambulatory Visit (INDEPENDENT_AMBULATORY_CARE_PROVIDER_SITE_OTHER): Payer: Medicare Other | Admitting: Endocrinology

## 2016-10-24 VITALS — BP 148/90 | HR 86 | Ht 79.0 in | Wt 379.0 lb

## 2016-10-24 DIAGNOSIS — I639 Cerebral infarction, unspecified: Secondary | ICD-10-CM

## 2016-10-24 DIAGNOSIS — E230 Hypopituitarism: Secondary | ICD-10-CM | POA: Diagnosis not present

## 2016-10-24 DIAGNOSIS — E039 Hypothyroidism, unspecified: Secondary | ICD-10-CM

## 2016-10-24 DIAGNOSIS — E2749 Other adrenocortical insufficiency: Secondary | ICD-10-CM | POA: Diagnosis not present

## 2016-10-24 LAB — BASIC METABOLIC PANEL
BUN: 18 mg/dL (ref 6–23)
CO2: 27 mEq/L (ref 19–32)
Calcium: 10.2 mg/dL (ref 8.4–10.5)
Chloride: 102 mEq/L (ref 96–112)
Creatinine, Ser: 1.31 mg/dL (ref 0.40–1.50)
GFR: 57.32 mL/min — ABNORMAL LOW (ref >60.00)
Glucose, Bld: 111 mg/dL — ABNORMAL HIGH (ref 70–99)
Potassium: 5.5 mEq/L — ABNORMAL HIGH (ref 3.5–5.1)
Sodium: 135 mEq/L (ref 135–145)

## 2016-10-24 LAB — TESTOSTERONE: Testosterone: 149.81 ng/dL — ABNORMAL LOW (ref 300.00–890.00)

## 2016-10-24 LAB — TSH: TSH: 5.53 u[IU]/mL — ABNORMAL HIGH (ref 0.35–4.50)
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
## 2016-10-24 NOTE — Progress Notes (Signed)
Carelink Summary Report / Loop Recorder 

## 2016-10-24 NOTE — Telephone Encounter (Signed)
The Note From 4/12 is Incorrect. Please disreguard

## 2016-10-25 DIAGNOSIS — M5126 Other intervertebral disc displacement, lumbar region: Secondary | ICD-10-CM | POA: Diagnosis not present

## 2016-10-25 NOTE — Addendum Note (Signed)
Addended by: Kaylyn Lim I on: 10/25/2016 02:54 PM   Modules accepted: Orders

## 2016-10-26 LAB — LUTEINIZING HORMONE: LH: 7.67 m[IU]/mL (ref 3.10–34.60)

## 2016-10-26 LAB — T4, FREE: Free T4: 0.95 ng/dL (ref 0.60–1.60)

## 2016-10-26 NOTE — Progress Notes (Signed)
Lab results: Testosterone level is still very low, he can stop his clomiphene as it is not working, he will need to use to patches of Androderm at a time daily, confirm that he is doing this every day  POTASSIUM level is high, needs to stop all potassium supplements and high potassium foods like bananas and citrus fruits THYROID level is slightly low, he can continue 175 by Santiago Glad but take extra half tablet weekly

## 2016-10-27 ENCOUNTER — Other Ambulatory Visit: Payer: Self-pay | Admitting: Internal Medicine

## 2016-10-28 ENCOUNTER — Other Ambulatory Visit: Payer: Self-pay

## 2016-10-28 MED ORDER — TESTOSTERONE 4 MG/24HR TD PT24: MEDICATED_PATCH | TRANSDERMAL | 2 refills | Status: DC

## 2016-11-06 LAB — CUP PACEART REMOTE DEVICE CHECK
Date Time Interrogation Session: 20180415233815
Implantable Pulse Generator Implant Date: 20170719

## 2016-11-06 NOTE — Progress Notes (Signed)
Carelink summary report received. Battery status OK. Normal device function. No new symptom episodes, tachy episodes, brady, or pause episodes. No new AF episodes. Monthly summary reports and ROV/PRN 

## 2016-11-09 ENCOUNTER — Encounter (HOSPITAL_COMMUNITY): Payer: Self-pay | Admitting: *Deleted

## 2016-11-09 ENCOUNTER — Emergency Department (HOSPITAL_COMMUNITY)
Admission: EM | Admit: 2016-11-09 | Discharge: 2016-11-09 | Disposition: A | Discharge status: Home / Self Care | Payer: Medicare Other | Attending: Emergency Medicine | Admitting: Emergency Medicine

## 2016-11-09 ENCOUNTER — Emergency Department (HOSPITAL_COMMUNITY): Payer: Medicare Other

## 2016-11-09 ENCOUNTER — Ambulatory Visit: Payer: Medicare Other | Admitting: Family Medicine

## 2016-11-09 ENCOUNTER — Telehealth: Payer: Self-pay | Admitting: Endocrinology

## 2016-11-09 ENCOUNTER — Emergency Department (HOSPITAL_BASED_OUTPATIENT_CLINIC_OR_DEPARTMENT_OTHER)
Admit: 2016-11-09 | Discharge: 2016-11-09 | Disposition: A | Discharge status: Home / Self Care | Payer: Medicare Other | Attending: Emergency Medicine | Admitting: Emergency Medicine

## 2016-11-09 ENCOUNTER — Telehealth: Payer: Self-pay

## 2016-11-09 DIAGNOSIS — Z96652 Presence of left artificial knee joint: Secondary | ICD-10-CM | POA: Insufficient documentation

## 2016-11-09 DIAGNOSIS — I2510 Atherosclerotic heart disease of native coronary artery without angina pectoris: Secondary | ICD-10-CM | POA: Diagnosis not present

## 2016-11-09 DIAGNOSIS — I110 Hypertensive heart disease with heart failure: Secondary | ICD-10-CM | POA: Insufficient documentation

## 2016-11-09 DIAGNOSIS — M7989 Other specified soft tissue disorders: Secondary | ICD-10-CM | POA: Diagnosis not present

## 2016-11-09 DIAGNOSIS — E039 Hypothyroidism, unspecified: Secondary | ICD-10-CM | POA: Diagnosis not present

## 2016-11-09 DIAGNOSIS — Z8547 Personal history of malignant neoplasm of testis: Secondary | ICD-10-CM | POA: Insufficient documentation

## 2016-11-09 DIAGNOSIS — Z8673 Personal history of transient ischemic attack (TIA), and cerebral infarction without residual deficits: Secondary | ICD-10-CM | POA: Diagnosis not present

## 2016-11-09 DIAGNOSIS — R600 Localized edema: Secondary | ICD-10-CM | POA: Diagnosis present

## 2016-11-09 DIAGNOSIS — R339 Retention of urine, unspecified: Secondary | ICD-10-CM | POA: Diagnosis not present

## 2016-11-09 DIAGNOSIS — R0602 Shortness of breath: Secondary | ICD-10-CM | POA: Diagnosis not present

## 2016-11-09 DIAGNOSIS — I5030 Unspecified diastolic (congestive) heart failure: Secondary | ICD-10-CM | POA: Insufficient documentation

## 2016-11-09 DIAGNOSIS — Z79899 Other long term (current) drug therapy: Secondary | ICD-10-CM | POA: Diagnosis not present

## 2016-11-09 DIAGNOSIS — L03115 Cellulitis of right lower limb: Secondary | ICD-10-CM | POA: Insufficient documentation

## 2016-11-09 DIAGNOSIS — I252 Old myocardial infarction: Secondary | ICD-10-CM | POA: Insufficient documentation

## 2016-11-09 LAB — URINALYSIS, ROUTINE W REFLEX MICROSCOPIC
Bilirubin Urine: NEGATIVE
Glucose, UA: NEGATIVE mg/dL
Hgb urine dipstick: NEGATIVE
Ketones, ur: NEGATIVE mg/dL
Leukocytes, UA: NEGATIVE
Nitrite: NEGATIVE
Protein, ur: NEGATIVE mg/dL
Specific Gravity, Urine: 1.016 (ref 1.005–1.030)
pH: 5 (ref 5.0–8.0)

## 2016-11-09 LAB — I-STAT TROPONIN, ED: Troponin i, poc: 0 ng/mL (ref 0.00–0.08)

## 2016-11-09 LAB — CBC
HCT: 40.5 % (ref 39.0–52.0)
Hemoglobin: 13.2 g/dL (ref 13.0–17.0)
MCH: 28 pg (ref 26.0–34.0)
MCHC: 32.6 g/dL (ref 30.0–36.0)
MCV: 86 fL (ref 78.0–100.0)
Platelets: 153 10*3/uL (ref 150–400)
RBC: 4.71 MIL/uL (ref 4.22–5.81)
RDW: 13.4 % (ref 11.5–15.5)
WBC: 5.5 10*3/uL (ref 4.0–10.5)

## 2016-11-09 LAB — BASIC METABOLIC PANEL
Anion gap: 7 (ref 5–15)
BUN: 16 mg/dL (ref 6–20)
CO2: 25 mmol/L (ref 22–32)
Calcium: 9.2 mg/dL (ref 8.9–10.3)
Chloride: 102 mmol/L (ref 101–111)
Creatinine, Ser: 1.2 mg/dL (ref 0.61–1.24)
GFR calc Af Amer: >60 mL/min (ref >60)
GFR calc non Af Amer: 59 mL/min — ABNORMAL LOW (ref >60)
Glucose, Bld: 117 mg/dL — ABNORMAL HIGH (ref 65–99)
Potassium: 3.5 mmol/L (ref 3.5–5.1)
Sodium: 134 mmol/L — ABNORMAL LOW (ref 135–145)

## 2016-11-09 LAB — BRAIN NATRIURETIC PEPTIDE: B Natriuretic Peptide: 25.3 pg/mL (ref 0.0–100.0)

## 2016-11-09 MED ORDER — TESTOSTERONE 4 MG/24HR TD PT24: MEDICATED_PATCH | TRANSDERMAL | 2 refills | Status: DC

## 2016-11-09 MED ORDER — CLINDAMYCIN HCL 150 MG PO CAPS: 450.0000 mg | ORAL_CAPSULE | Freq: Three times a day (TID) | ORAL | 0 refills | Status: DC

## 2016-11-09 MED ORDER — CLINDAMYCIN PHOSPHATE 900 MG/50ML IV SOLN
900.0000 mg | Freq: Once | INTRAVENOUS | Status: AC
  Administered 2016-11-09: 900 mg via INTRAVENOUS
  Filled 2016-11-09: qty 50

## 2016-11-09 MED ORDER — TESTOSTERONE 2 MG/24HR TD PT24
MEDICATED_PATCH | TRANSDERMAL | 2 refills | Status: DC
Start: 2016-11-09 — End: 2016-12-11

## 2016-11-09 NOTE — ED Notes (Signed)
Patient c/o swelling to bilateral lower ext onset several days ago, redness to lower ext. States he doubled his lasix stating he was taking 80 mg bid. States he doesn't have bladder control, states he dribbles and his urine stream is small. States his abd. Is bloated.

## 2016-11-09 NOTE — Telephone Encounter (Signed)
See message and please advise on how to proceed. Thanks!  

## 2016-11-09 NOTE — Telephone Encounter (Signed)
[facility] called in and said that we faxed over a form saying that the Androderm script does not need a PA, however, the Script is written for 2 patches daily, and the insurance will not pay for 2 patches daily, they will pay for one patch.  The pharmacy needs to know what to do, please advise.

## 2016-11-09 NOTE — Progress Notes (Signed)
*  Preliminary Results* Right lower extremity venous duplex completed. Right lower extremity is negative for deep vein thrombosis. There is no evidence of right Baker's cyst.  11/09/2016 5:59 PM  Maudry Mayhew, BS, RVT, RDCS, RDMS

## 2016-11-09 NOTE — Telephone Encounter (Signed)
FYI...  Pt called complaining of increasing "Kidney Pain", reduced urine output despite taking an extra diuretic.  He is now saying his left leg is swelling, becoming red and painful.  He is not sure if he is running a fever but does have "cold chills".    He feels as though is Addison's Disease is "Acting up" he is having increased trouble with tremors.    I advised him with his symptoms that are worsening and his history it would be best to go to the ED.  He agreed with the plan and is going to [facility].    Thanks,   -Dennis [name]

## 2016-11-09 NOTE — ED Triage Notes (Signed)
Pr started having swelling to BLE over the weekend with some redness, then abdomen swelled, then developed lower back pain/kidney pain.  Pt had a kidney stone on left side about one month ago and told him to try to pass it.  He took pain pills and drank some beer to get rid of this.  Pt states then started having panting with breathing. Pt increased to lasix 80mg  and is not having much output.  Pt is talking in complete sentences. Pt is on plavix.  Pt states only a dribble of urine
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
## 2016-11-09 NOTE — ED Notes (Signed)
Pt denies pain  Both feet and ankles swollen with redness.  Pt hanging off the stretcher at the foot he is 6 ft 7 inches

## 2016-11-09 NOTE — ED Notes (Signed)
Bladder scan revealed 7mL

## 2016-11-09 NOTE — Telephone Encounter (Signed)
He can have 1 patch of the 4 mg and 1 patch of the 2 mg to apply at same time

## 2016-11-09 NOTE — ED Notes (Signed)
Ordered Hospital Bed 

## 2016-11-09 NOTE — Telephone Encounter (Signed)
Prescription's placed on your desk to sign.

## 2016-11-15 ENCOUNTER — Emergency Department (HOSPITAL_COMMUNITY): Payer: Medicare Other

## 2016-11-15 ENCOUNTER — Inpatient Hospital Stay (HOSPITAL_COMMUNITY): Payer: Medicare Other

## 2016-11-15 ENCOUNTER — Inpatient Hospital Stay (HOSPITAL_COMMUNITY)
Admission: EM | Admit: 2016-11-15 | Discharge: 2016-11-17 | DRG: 064 | Disposition: A | Discharge status: Home Health | Payer: Medicare Other | Attending: Internal Medicine | Admitting: Internal Medicine

## 2016-11-15 ENCOUNTER — Encounter (HOSPITAL_COMMUNITY): Payer: Self-pay

## 2016-11-15 DIAGNOSIS — E291 Testicular hypofunction: Secondary | ICD-10-CM | POA: Diagnosis present

## 2016-11-15 DIAGNOSIS — M545 Low back pain: Secondary | ICD-10-CM | POA: Diagnosis present

## 2016-11-15 DIAGNOSIS — E7800 Pure hypercholesterolemia, unspecified: Secondary | ICD-10-CM | POA: Diagnosis present

## 2016-11-15 DIAGNOSIS — Z823 Family history of stroke: Secondary | ICD-10-CM

## 2016-11-15 DIAGNOSIS — I2510 Atherosclerotic heart disease of native coronary artery without angina pectoris: Secondary | ICD-10-CM | POA: Diagnosis present

## 2016-11-15 DIAGNOSIS — M7989 Other specified soft tissue disorders: Secondary | ICD-10-CM

## 2016-11-15 DIAGNOSIS — Z833 Family history of diabetes mellitus: Secondary | ICD-10-CM

## 2016-11-15 DIAGNOSIS — G4733 Obstructive sleep apnea (adult) (pediatric): Secondary | ICD-10-CM | POA: Diagnosis present

## 2016-11-15 DIAGNOSIS — N401 Enlarged prostate with lower urinary tract symptoms: Secondary | ICD-10-CM | POA: Diagnosis not present

## 2016-11-15 DIAGNOSIS — I6340 Cerebral infarction due to embolism of unspecified cerebral artery: Secondary | ICD-10-CM | POA: Diagnosis not present

## 2016-11-15 DIAGNOSIS — G8191 Hemiplegia, unspecified affecting right dominant side: Secondary | ICD-10-CM | POA: Diagnosis present

## 2016-11-15 DIAGNOSIS — Z91030 Bee allergy status: Secondary | ICD-10-CM

## 2016-11-15 DIAGNOSIS — M1A9XX1 Chronic gout, unspecified, with tophus (tophi): Secondary | ICD-10-CM | POA: Diagnosis not present

## 2016-11-15 DIAGNOSIS — R29810 Facial weakness: Secondary | ICD-10-CM | POA: Diagnosis present

## 2016-11-15 DIAGNOSIS — E2740 Unspecified adrenocortical insufficiency: Secondary | ICD-10-CM | POA: Diagnosis present

## 2016-11-15 DIAGNOSIS — E039 Hypothyroidism, unspecified: Secondary | ICD-10-CM | POA: Diagnosis not present

## 2016-11-15 DIAGNOSIS — Z8673 Personal history of transient ischemic attack (TIA), and cerebral infarction without residual deficits: Secondary | ICD-10-CM | POA: Diagnosis not present

## 2016-11-15 DIAGNOSIS — Z8547 Personal history of malignant neoplasm of testis: Secondary | ICD-10-CM

## 2016-11-15 DIAGNOSIS — Z91013 Allergy to seafood: Secondary | ICD-10-CM

## 2016-11-15 DIAGNOSIS — M6281 Muscle weakness (generalized): Secondary | ICD-10-CM | POA: Diagnosis not present

## 2016-11-15 DIAGNOSIS — Z96653 Presence of artificial knee joint, bilateral: Secondary | ICD-10-CM | POA: Diagnosis present

## 2016-11-15 DIAGNOSIS — I5032 Chronic diastolic (congestive) heart failure: Secondary | ICD-10-CM | POA: Diagnosis present

## 2016-11-15 DIAGNOSIS — Z6841 Body Mass Index (BMI) 40.0 and over, adult: Secondary | ICD-10-CM

## 2016-11-15 DIAGNOSIS — I878 Other specified disorders of veins: Secondary | ICD-10-CM | POA: Diagnosis present

## 2016-11-15 DIAGNOSIS — I5033 Acute on chronic diastolic (congestive) heart failure: Secondary | ICD-10-CM | POA: Diagnosis present

## 2016-11-15 DIAGNOSIS — Z872 Personal history of diseases of the skin and subcutaneous tissue: Secondary | ICD-10-CM

## 2016-11-15 DIAGNOSIS — F329 Major depressive disorder, single episode, unspecified: Secondary | ICD-10-CM | POA: Diagnosis present

## 2016-11-15 DIAGNOSIS — E271 Primary adrenocortical insufficiency: Secondary | ICD-10-CM | POA: Diagnosis not present

## 2016-11-15 DIAGNOSIS — Z87442 Personal history of urinary calculi: Secondary | ICD-10-CM

## 2016-11-15 DIAGNOSIS — N138 Other obstructive and reflux uropathy: Secondary | ICD-10-CM | POA: Diagnosis present

## 2016-11-15 DIAGNOSIS — E785 Hyperlipidemia, unspecified: Secondary | ICD-10-CM | POA: Diagnosis present

## 2016-11-15 DIAGNOSIS — Z981 Arthrodesis status: Secondary | ICD-10-CM

## 2016-11-15 DIAGNOSIS — Z8249 Family history of ischemic heart disease and other diseases of the circulatory system: Secondary | ICD-10-CM

## 2016-11-15 DIAGNOSIS — I6601 Occlusion and stenosis of right middle cerebral artery: Secondary | ICD-10-CM | POA: Diagnosis not present

## 2016-11-15 DIAGNOSIS — I639 Cerebral infarction, unspecified: Principal | ICD-10-CM

## 2016-11-15 DIAGNOSIS — I252 Old myocardial infarction: Secondary | ICD-10-CM | POA: Diagnosis not present

## 2016-11-15 DIAGNOSIS — I10 Essential (primary) hypertension: Secondary | ICD-10-CM | POA: Diagnosis present

## 2016-11-15 DIAGNOSIS — G8929 Other chronic pain: Secondary | ICD-10-CM | POA: Diagnosis present

## 2016-11-15 DIAGNOSIS — R531 Weakness: Secondary | ICD-10-CM

## 2016-11-15 DIAGNOSIS — Z7902 Long term (current) use of antithrombotics/antiplatelets: Secondary | ICD-10-CM

## 2016-11-15 DIAGNOSIS — Z8551 Personal history of malignant neoplasm of bladder: Secondary | ICD-10-CM

## 2016-11-15 DIAGNOSIS — I6789 Other cerebrovascular disease: Secondary | ICD-10-CM | POA: Diagnosis not present

## 2016-11-15 DIAGNOSIS — Z888 Allergy status to other drugs, medicaments and biological substances status: Secondary | ICD-10-CM

## 2016-11-15 DIAGNOSIS — Z79899 Other long term (current) drug therapy: Secondary | ICD-10-CM

## 2016-11-15 DIAGNOSIS — R51 Headache: Secondary | ICD-10-CM | POA: Diagnosis not present

## 2016-11-15 DIAGNOSIS — R42 Dizziness and giddiness: Secondary | ICD-10-CM | POA: Diagnosis not present

## 2016-11-15 DIAGNOSIS — L03115 Cellulitis of right lower limb: Secondary | ICD-10-CM | POA: Diagnosis not present

## 2016-11-15 DIAGNOSIS — Z79890 Hormone replacement therapy (postmenopausal): Secondary | ICD-10-CM

## 2016-11-15 DIAGNOSIS — I110 Hypertensive heart disease with heart failure: Secondary | ICD-10-CM | POA: Diagnosis present

## 2016-11-15 LAB — I-STAT CHEM 8, ED
BUN: 20 mg/dL (ref 6–20)
Calcium, Ion: 1.13 mmol/L — ABNORMAL LOW (ref 1.15–1.40)
Chloride: 102 mmol/L (ref 101–111)
Creatinine, Ser: 1.2 mg/dL (ref 0.61–1.24)
Glucose, Bld: 138 mg/dL — ABNORMAL HIGH (ref 65–99)
HCT: 40 % (ref 39.0–52.0)
Hemoglobin: 13.6 g/dL (ref 13.0–17.0)
Potassium: 3.9 mmol/L (ref 3.5–5.1)
Sodium: 138 mmol/L (ref 135–145)
TCO2: 26 mmol/L (ref 0–100)

## 2016-11-15 LAB — COMPREHENSIVE METABOLIC PANEL
ALT: 17 U/L (ref 17–63)
AST: 20 U/L (ref 15–41)
Albumin: 3.7 g/dL (ref 3.5–5.0)
Alkaline Phosphatase: 55 U/L (ref 38–126)
Anion gap: 7 (ref 5–15)
BUN: 18 mg/dL (ref 6–20)
CO2: 25 mmol/L (ref 22–32)
Calcium: 9.2 mg/dL (ref 8.9–10.3)
Chloride: 104 mmol/L (ref 101–111)
Creatinine, Ser: 1.16 mg/dL (ref 0.61–1.24)
GFR calc Af Amer: >60 mL/min (ref >60)
GFR calc non Af Amer: >60 mL/min (ref >60)
Glucose, Bld: 141 mg/dL — ABNORMAL HIGH (ref 65–99)
Potassium: 3.9 mmol/L (ref 3.5–5.1)
Sodium: 136 mmol/L (ref 135–145)
Total Bilirubin: 0.9 mg/dL (ref 0.3–1.2)
Total Protein: 6.9 g/dL (ref 6.5–8.1)

## 2016-11-15 LAB — RAPID URINE DRUG SCREEN, HOSP PERFORMED
Amphetamines: NOT DETECTED
Barbiturates: NOT DETECTED
Benzodiazepines: NOT DETECTED
Cocaine: NOT DETECTED
Opiates: NOT DETECTED
Tetrahydrocannabinol: NOT DETECTED

## 2016-11-15 LAB — CBC
HCT: 41.6 % (ref 39.0–52.0)
Hemoglobin: 13.5 g/dL (ref 13.0–17.0)
MCH: 27.6 pg (ref 26.0–34.0)
MCHC: 32.5 g/dL (ref 30.0–36.0)
MCV: 84.9 fL (ref 78.0–100.0)
Platelets: 161 10*3/uL (ref 150–400)
RBC: 4.9 MIL/uL (ref 4.22–5.81)
RDW: 13.3 % (ref 11.5–15.5)
WBC: 4.9 10*3/uL (ref 4.0–10.5)

## 2016-11-15 LAB — URINALYSIS, ROUTINE W REFLEX MICROSCOPIC
Bilirubin Urine: NEGATIVE
Glucose, UA: NEGATIVE mg/dL
Hgb urine dipstick: NEGATIVE
Ketones, ur: NEGATIVE mg/dL
Leukocytes, UA: NEGATIVE
Nitrite: NEGATIVE
Protein, ur: NEGATIVE mg/dL
Specific Gravity, Urine: 1.01 (ref 1.005–1.030)
pH: 6 (ref 5.0–8.0)

## 2016-11-15 LAB — I-STAT TROPONIN, ED: Troponin i, poc: 0 ng/mL (ref 0.00–0.08)

## 2016-11-15 LAB — APTT: aPTT: 33 seconds (ref 24–36)

## 2016-11-15 LAB — DIFFERENTIAL
Basophils Absolute: 0 10*3/uL (ref 0.0–0.1)
Basophils Relative: 0 %
Eosinophils Absolute: 0.1 10*3/uL (ref 0.0–0.7)
Eosinophils Relative: 3 %
Lymphocytes Relative: 23 %
Lymphs Abs: 1.1 10*3/uL (ref 0.7–4.0)
Monocytes Absolute: 0.3 10*3/uL (ref 0.1–1.0)
Monocytes Relative: 6 %
Neutro Abs: 3.4 10*3/uL (ref 1.7–7.7)
Neutrophils Relative %: 68 %

## 2016-11-15 LAB — PROTIME-INR
INR: 1.09
Prothrombin Time: 14.2 seconds (ref 11.4–15.2)

## 2016-11-15 LAB — ETHANOL: Alcohol, Ethyl (B): <5 mg/dL (ref <5)

## 2016-11-15 MED ORDER — PROMETHAZINE HCL 25 MG PO TABS: 12.5000 mg | ORAL_TABLET | Freq: Four times a day (QID) | ORAL | Status: DC | PRN

## 2016-11-15 MED ORDER — METHOCARBAMOL 500 MG PO TABS: 500.0000 mg | ORAL_TABLET | Freq: Four times a day (QID) | ORAL | Status: DC | PRN

## 2016-11-15 MED ORDER — SODIUM CHLORIDE 0.9% FLUSH
3.0000 mL | Freq: Two times a day (BID) | INTRAVENOUS | Status: DC
  Administered 2016-11-15 – 2016-11-17 (×5): 3 mL via INTRAVENOUS

## 2016-11-15 MED ORDER — STROKE: EARLY STAGES OF RECOVERY BOOK
Freq: Once | Status: AC
  Administered 2016-11-15: 17:00:00
  Filled 2016-11-15: qty 1

## 2016-11-15 MED ORDER — CLINDAMYCIN HCL 300 MG PO CAPS
450.0000 mg | ORAL_CAPSULE | Freq: Three times a day (TID) | ORAL | Status: DC
  Administered 2016-11-15 – 2016-11-17 (×6): 450 mg via ORAL
  Filled 2016-11-15 (×7): qty 1

## 2016-11-15 MED ORDER — HEPARIN SODIUM (PORCINE) 5000 UNIT/ML IJ SOLN
5000.0000 [IU] | Freq: Three times a day (TID) | INTRAMUSCULAR | Status: DC
  Administered 2016-11-15 – 2016-11-16 (×4): 5000 [IU] via SUBCUTANEOUS
  Filled 2016-11-15 (×4): qty 1

## 2016-11-15 MED ORDER — OXYCODONE-ACETAMINOPHEN 5-325 MG PO TABS: 1.0000 | ORAL_TABLET | Freq: Four times a day (QID) | ORAL | Status: DC | PRN

## 2016-11-15 MED ORDER — SODIUM CHLORIDE 0.9% FLUSH: 3.0000 mL | INTRAVENOUS | Status: DC | PRN

## 2016-11-15 MED ORDER — ALLOPURINOL 100 MG PO TABS
100.0000 mg | ORAL_TABLET | Freq: Two times a day (BID) | ORAL | Status: DC
  Administered 2016-11-15 – 2016-11-17 (×4): 100 mg via ORAL
  Filled 2016-11-15 (×4): qty 1

## 2016-11-15 MED ORDER — TAMSULOSIN HCL 0.4 MG PO CAPS
0.4000 mg | ORAL_CAPSULE | Freq: Every day | ORAL | Status: DC
  Administered 2016-11-16 – 2016-11-17 (×2): 0.4 mg via ORAL
  Filled 2016-11-15 (×2): qty 1

## 2016-11-15 MED ORDER — INDOMETHACIN 25 MG PO CAPS
50.0000 mg | ORAL_CAPSULE | Freq: Two times a day (BID) | ORAL | Status: DC
  Administered 2016-11-15 – 2016-11-17 (×4): 50 mg via ORAL
  Filled 2016-11-15 (×3): qty 1
  Filled 2016-11-15 (×3): qty 2

## 2016-11-15 MED ORDER — ACETAMINOPHEN 325 MG PO TABS: 650.0000 mg | ORAL_TABLET | Freq: Four times a day (QID) | ORAL | Status: DC | PRN

## 2016-11-15 MED ORDER — HYDRALAZINE HCL 20 MG/ML IJ SOLN: 5.0000 mg | INTRAMUSCULAR | Status: DC | PRN

## 2016-11-15 MED ORDER — CLOPIDOGREL BISULFATE 75 MG PO TABS
75.0000 mg | ORAL_TABLET | Freq: Every day | ORAL | Status: DC
  Administered 2016-11-16 – 2016-11-17 (×2): 75 mg via ORAL
  Filled 2016-11-15 (×2): qty 1

## 2016-11-15 MED ORDER — HYDROCORTISONE 10 MG PO TABS
10.0000 mg | ORAL_TABLET | Freq: Two times a day (BID) | ORAL | Status: DC
  Administered 2016-11-15 – 2016-11-17 (×4): 10 mg via ORAL
  Filled 2016-11-15 (×4): qty 1

## 2016-11-15 MED ORDER — COLCHICINE 0.6 MG PO TABS
0.6000 mg | ORAL_TABLET | Freq: Two times a day (BID) | ORAL | Status: DC
  Administered 2016-11-15 – 2016-11-17 (×4): 0.6 mg via ORAL
  Filled 2016-11-15 (×4): qty 1

## 2016-11-15 MED ORDER — ACETAMINOPHEN 650 MG RE SUPP: 650.0000 mg | Freq: Four times a day (QID) | RECTAL | Status: DC | PRN

## 2016-11-15 MED ORDER — ASPIRIN 325 MG PO TABS
325.0000 mg | ORAL_TABLET | Freq: Every day | ORAL | Status: DC
  Administered 2016-11-15 – 2016-11-17 (×3): 325 mg via ORAL
  Filled 2016-11-15 (×3): qty 1

## 2016-11-15 MED ORDER — ASPIRIN 300 MG RE SUPP: 300.0000 mg | Freq: Every day | RECTAL | Status: DC

## 2016-11-15 MED ORDER — PROCHLORPERAZINE EDISYLATE 5 MG/ML IJ SOLN
10.0000 mg | Freq: Once | INTRAMUSCULAR | Status: AC
  Administered 2016-11-15: 10 mg via INTRAVENOUS
  Filled 2016-11-15: qty 2

## 2016-11-15 MED ORDER — ATORVASTATIN CALCIUM 80 MG PO TABS
80.0000 mg | ORAL_TABLET | Freq: Every day | ORAL | Status: DC
  Administered 2016-11-16 – 2016-11-17 (×2): 80 mg via ORAL
  Filled 2016-11-15 (×2): qty 1

## 2016-11-15 MED ORDER — ESCITALOPRAM OXALATE 10 MG PO TABS
20.0000 mg | ORAL_TABLET | Freq: Every day | ORAL | Status: DC
  Administered 2016-11-16 – 2016-11-17 (×2): 20 mg via ORAL
  Filled 2016-11-15 (×2): qty 2

## 2016-11-15 MED ORDER — SODIUM CHLORIDE 0.9 % IV SOLN: 250.0000 mL | INTRAVENOUS | Status: DC | PRN

## 2016-11-15 MED ORDER — LEVOTHYROXINE SODIUM 100 MCG PO TABS
200.0000 ug | ORAL_TABLET | Freq: Every day | ORAL | Status: DC
  Administered 2016-11-16 – 2016-11-17 (×2): 200 ug via ORAL
  Filled 2016-11-15 (×2): qty 2

## 2016-11-15 MED ORDER — DIPHENHYDRAMINE HCL 50 MG/ML IJ SOLN
25.0000 mg | Freq: Once | INTRAMUSCULAR | Status: AC
  Administered 2016-11-15: 25 mg via INTRAVENOUS
  Filled 2016-11-15: qty 1

## 2016-11-15 NOTE — Progress Notes (Signed)
Patient left for MRI via stretcher.
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
## 2016-11-15 NOTE — ED Triage Notes (Signed)
Pt states he woke up this morning with dizziness. LSN was last night before bed. Pt alert and oriented. Right side weakness noted. Pt has extensive cardiac hx as well as stroke hx.
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
## 2016-11-15 NOTE — Evaluation (Addendum)
Physical Therapy Evaluation Patient Details Name: Dennis Mccaughan Imperato Sr. MRN: [id number] DOB: 1946/04/29 Today's Date: 11/15/2016   History of Present Illness  71 y.o. male who awoke with a sensation of vertigo, subjective feeling of right facial droop, subjective right arm weakness. Neuro work up underway. Of note He has bilateral cellulitis in his lower legs. that he has received treatment for.MRI pending.  Clinical Impression  Orders received for PT evaluation. Patient demonstrates deficits in functional mobility as indicated below. Will benefit from continued skilled PT to address deficits and maximize function. Will see as indicated and progress as tolerated.      Follow Up Recommendations Home health PT;Supervision/Assistance - 24 hour    Equipment Recommendations  None recommended by PT    Recommendations for Other Services       Precautions / Restrictions Precautions Precautions: Fall Precaution Comments: hx of back pain      Mobility  Bed Mobility Overal bed mobility: Needs Assistance Bed Mobility: Supine to Sit     Supine to sit: Min guard        Transfers Overall transfer level: Needs assistance Equipment used: 2 person hand held assist Transfers: Sit to/from [facility] Sit to Stand: Min guard;+2 safety/equipment Stand pivot transfers: Min guard;+2 safety/equipment          Ambulation/Gait Ambulation/Gait assistance: Min guard Ambulation Distance (Feet): 16 Feet Assistive device: Rolling walker (2 wheeled) Gait Pattern/deviations: Step-to pattern;Decreased stride length;Trunk flexed Gait velocity: decreased   General Gait Details: patient steady with gait using Rw  Stairs            Wheelchair Mobility    Modified Rankin (Stroke Patients Only) Modified Rankin (Stroke Patients Only) Pre-Morbid Rankin Score: No symptoms Modified Rankin: Moderate disability     Balance Overall balance assessment: Needs assistance    Sitting balance-Leahy Scale: Good       Standing balance-Leahy Scale: Fair                               Pertinent Vitals/Pain Pain Assessment: Faces Faces Pain Scale: Hurts even more Pain Location: back/side/legs Pain Descriptors / Indicators: Constant;Discomfort Pain Intervention(s): Limited activity within patient's tolerance    Home Living Family/patient expects to be discharged to:: Private residence Living Arrangements: Children;Spouse/significant other Available Help at Discharge: Family;Available PRN/intermittently Type of Home: House Home Access: Stairs to enter Entrance Stairs-Rails: Can reach both;Left;Right Entrance Stairs-Number of Steps: 5 Home Layout: Two level;Able to live on main level with bedroom/bathroom Home Equipment: Shower seat;Walker - 2 wheels;Bedside commode;Hand held shower head;Crutches;Cane - single point      Prior Function Level of Independence: Independent               Hand Dominance   Dominant Hand: Left    Extremity/Trunk Assessment   Upper Extremity Assessment Upper Extremity Assessment: RUE deficits/detail RUE Deficits / Details: inconsistent resonses. General weakness. weaker distally. 3+/5 grip strength , otherwise 4/5 throughout. However, able to use R hand to operate lever on recliner RUE Sensation: decreased light touch RUE Coordination: decreased fine motor    Lower Extremity Assessment Lower Extremity Assessment: Defer to PT evaluation;RLE deficits/detail RLE Deficits / Details: inconsistent testing, patient with some modest strength deficits grossly compared to Left LE and some diminishd coordination but unclear depiction of deficits RLE Sensation: decreased light touch RLE Coordination: decreased fine motor    Cervical / Trunk Assessment Cervical / Trunk Assessment: Other exceptions (multiple  back surgeries)  Communication   Communication: No difficulties  Cognition Arousal/Alertness:  Awake/alert Behavior During Therapy: WFL for tasks assessed/performed Overall Cognitive Status: Within Functional Limits for tasks assessed                                        General Comments General comments (skin integrity, edema, etc.): noted LE edema and erythema    Exercises     Assessment/Plan    PT Assessment Patient needs continued PT services  PT Problem List Decreased strength;Decreased activity tolerance;Decreased balance;Decreased mobility;Decreased coordination;Decreased safety awareness       PT Treatment Interventions DME instruction;Gait training;Stair training;Functional mobility training;Therapeutic activities;Therapeutic exercise;Balance training;Neuromuscular re-education;Patient/family education    PT Goals (Current goals can be found in the Care Plan section)  Acute Rehab PT Goals Patient Stated Goal: to go home PT Goal Formulation: With patient/family Time For Goal Achievement: 11/29/16 Potential to Achieve Goals: Good    Frequency Min 4X/week   Barriers to discharge        Co-evaluation PT/OT/SLP Co-Evaluation/Treatment: Yes Reason for Co-Treatment: For patient/therapist safety;To address functional/ADL transfers PT goals addressed during session: Mobility/safety with mobility OT goals addressed during session: ADL's and self-care       AM-PAC PT "6 Clicks" Daily Activity  Outcome Measure Difficulty turning over in bed (including adjusting bedclothes, sheets and blankets)?: Total Difficulty moving from lying on back to sitting on the side of the bed? : Total Difficulty sitting down on and standing up from a chair with arms (e.g., wheelchair, bedside commode, etc,.)?: Total Help needed moving to and from a bed to chair (including a wheelchair)?: A Little Help needed walking in hospital room?: A Little Help needed climbing 3-5 steps with a railing? : A Lot 6 Click Score: 11    End of Session Equipment Utilized During  Treatment: Gait belt Activity Tolerance: Patient tolerated treatment well Patient left: in chair;with call bell/phone within reach;with family/visitor present Nurse Communication: Mobility status PT Visit Diagnosis: Muscle weakness (generalized) (M62.81)    Time: 2094-7096 PT Time Calculation (min) (ACUTE ONLY): 28 min   Charges:   PT Evaluation $PT Eval Moderate Complexity: 1 Procedure     PT G Codes:        Alben Deeds, PT DPT  [phone]   Duncan Dull 11/15/2016, 6:03 PM
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
## 2016-11-15 NOTE — ED Notes (Signed)
Patient transported to CT 

## 2016-11-15 NOTE — Progress Notes (Signed)
Patient arrived to room from ED to room. Safely precautions and orders reviewed with patient. VSS. TELE applied and confirmed. No other distress noted. Will continue to monitor.  Ave Filter, RN

## 2016-11-15 NOTE — Progress Notes (Signed)
Occupational Therapy Evaluation Patient Details Name: Dennis Probert [place]church Sr. MRN: [id number] DOB: Mar 24, 1946 Today's Date: 11/15/2016    History of Present Illness 71 y.o. male who awoke with a sensation of vertigo, subjective feeling of right facial droop, subjective right arm weakness. Neuro work up underway. Of note He has bilateral cellulitis in his lower legs. that he has received treatment for.MRI pending.   Clinical Impression   PTA, pt independent with ADL and mobility. Pt retired, drives and had been at [employer] over the weekend working on [employer]. Pt demonstrates decline in functional level of independence and will benefit from acute OT to address established goals and facilitate safe DC home with HHOT. Pt complains of "spinning feeling when sitting, but resolves. Pt staes he has a history of vertigo. .     Follow Up Recommendations  Home health OT;Supervision/Assistance - 24 hour (initially)    Equipment Recommendations  None recommended by OT (need to clarify equipment pt owns)    Recommendations for Other Services   May consider formal vestibular evaluation     Precautions / Restrictions Precautions Precautions: Fall Precaution Comments: hx of back pain      Mobility Bed Mobility Overal bed mobility: Needs Assistance Bed Mobility: Supine to Sit     Supine to sit: Min guard        Transfers Overall transfer level: Needs assistance Equipment used: 2 person hand held assist Transfers: Sit to/from [facility] Sit to Stand: Min guard;+2 safety/equipment Stand pivot transfers: Min guard;+2 safety/equipment            Balance Overall balance assessment: Needs assistance   Sitting balance-Leahy Scale: Good       Standing balance-Leahy Scale: Fair                             ADL either performed or assessed with clinical judgement   ADL Overall ADL's : Needs assistance/impaired Eating/Feeding: Independent   Grooming:  Set up   Upper Body Bathing: Set up;Sitting   Lower Body Bathing: Minimal assistance;Sit to/from stand   Upper Body Dressing : Minimal assistance   Lower Body Dressing: Minimal assistance;Sit to/from stand   Toilet Transfer: Minimal assistance;RW   Toileting- Clothing Manipulation and Hygiene: Supervision/safety       Functional mobility during ADLs: Minimal assistance;Rolling walker       Vision Baseline Vision/History: Wears glasses Patient Visual Report: Other (comment) (hx of visual issues) Vision Assessment?: No apparent visual deficits     Perception Perception Perception Tested?: Yes   Praxis Praxis Praxis tested?: Within functional limits    Pertinent Vitals/Pain Pain Assessment: Faces Faces Pain Scale: Hurts even more Pain Location: back/side/legs Pain Descriptors / Indicators: Constant;Discomfort Pain Intervention(s): Limited activity within patient's tolerance     Hand Dominance Left   Extremity/Trunk Assessment Upper Extremity Assessment Upper Extremity Assessment: RUE deficits/detail RUE Deficits / Details: inconsistent resonses. General weakness. weaker distally. 3+/5 grip strength , otherwise 4/5 throughout. However, able to use R hand to operate lever on recliner RUE Sensation: decreased light touch RUE Coordination: decreased fine motor   Lower Extremity Assessment Lower Extremity Assessment: Defer to PT evaluation   Cervical / Trunk Assessment Cervical / Trunk Assessment: Other exceptions (multiple back surgeries)   Communication Communication Communication: No difficulties   Cognition Arousal/Alertness: Awake/alert Behavior During Therapy: WFL for tasks assessed/performed Overall Cognitive Status: Within Functional Limits for tasks assessed  General Comments       Exercises     Shoulder Instructions      Home Living Family/patient expects to be discharged to:: Private  residence Living Arrangements: Children;Spouse/significant other Available Help at Discharge: Family;Available PRN/intermittently Type of Home: House Home Access: Stairs to enter [employer] of Steps: 5 Entrance Stairs-Rails: Can reach both;Left;Right Home Layout: Two level;Able to live on main level with bedroom/bathroom Alternate Level Stairs-Number of Steps: flight Alternate Level Stairs-Rails: Right;Left Bathroom Shower/Tub: Occupational psychologist: Handicapped height Bathroom Accessibility: Yes How Accessible: Accessible via walker Home Equipment: Clinical cytogeneticist - 2 wheels;Bedside commode;Hand held shower head;Crutches;Cane - single point          Prior Functioning/Environment Level of Independence: Independent                 OT Problem List: Decreased strength;Impaired balance (sitting and/or standing);Decreased coordination;Decreased safety awareness;Decreased knowledge of use of DME or AE;Impaired sensation;Obesity;Pain      OT Treatment/Interventions: Self-care/ADL training;Therapeutic exercise;Neuromuscular education;DME and/or AE instruction;Therapeutic activities;Patient/family education;Balance training    OT Goals(Current goals can be found in the care plan section) Acute Rehab OT Goals Patient Stated Goal: to go home OT Goal Formulation: With patient Time For Goal Achievement: 11/29/16 Potential to Achieve Goals: Good  OT Frequency: Min 2X/week   Barriers to D/C:            Co-evaluation PT/OT/SLP Co-Evaluation/Treatment: Yes Reason for Co-Treatment: For patient/therapist safety;To address functional/ADL transfers   OT goals addressed during session: ADL's and self-care      AM-PAC PT "6 Clicks" Daily Activity     Outcome Measure Help from another person eating meals?: None Help from another person taking care of personal grooming?: None Help from another person toileting, which includes using toliet, bedpan, or  urinal?: A Little Help from another person bathing (including washing, rinsing, drying)?: A Little Help from another person to put on and taking off regular upper body clothing?: A Little Help from another person to put on and taking off regular lower body clothing?: A Little 6 Click Score: 20   End of Session Equipment Utilized During Treatment: Gait belt;Rolling walker Nurse Communication: Mobility status  Activity Tolerance: Patient tolerated treatment well Patient left: in chair;with call bell/phone within reach;with family/visitor present  OT Visit Diagnosis: Unsteadiness on feet (R26.81);Muscle weakness (generalized) (M62.81)                Time: 8527-7824 OT Time Calculation (min): 30 min Charges:  OT General Charges $OT Visit: 1 Procedure OT Evaluation $OT Eval Moderate Complexity: 1 Procedure G-Codes:     Lydia Toren, OT/L  [phone] 11/15/2016  Dennis [name],HILLARY 11/15/2016, 5:02 PM

## 2016-11-16 ENCOUNTER — Inpatient Hospital Stay (HOSPITAL_COMMUNITY): Payer: Medicare Other

## 2016-11-16 DIAGNOSIS — I639 Cerebral infarction, unspecified: Secondary | ICD-10-CM

## 2016-11-16 DIAGNOSIS — I6789 Other cerebrovascular disease: Secondary | ICD-10-CM

## 2016-11-16 DIAGNOSIS — Z8673 Personal history of transient ischemic attack (TIA), and cerebral infarction without residual deficits: Secondary | ICD-10-CM

## 2016-11-16 LAB — CBC
HCT: 40.8 % (ref 39.0–52.0)
Hemoglobin: 13.2 g/dL (ref 13.0–17.0)
MCH: 27.7 pg (ref 26.0–34.0)
MCHC: 32.4 g/dL (ref 30.0–36.0)
MCV: 85.5 fL (ref 78.0–100.0)
Platelets: 173 10*3/uL (ref 150–400)
RBC: 4.77 MIL/uL (ref 4.22–5.81)
RDW: 13.4 % (ref 11.5–15.5)
WBC: 5.2 10*3/uL (ref 4.0–10.5)

## 2016-11-16 LAB — LIPID PANEL
Cholesterol: 151 mg/dL (ref 0–200)
HDL: 26 mg/dL — ABNORMAL LOW (ref >40)
LDL Cholesterol: 89 mg/dL (ref 0–99)
Total CHOL/HDL Ratio: 5.8 RATIO
Triglycerides: 178 mg/dL — ABNORMAL HIGH (ref <150)
VLDL: 36 mg/dL (ref 0–40)

## 2016-11-16 LAB — BASIC METABOLIC PANEL
Anion gap: 8 (ref 5–15)
BUN: 17 mg/dL (ref 6–20)
CO2: 28 mmol/L (ref 22–32)
Calcium: 9.1 mg/dL (ref 8.9–10.3)
Chloride: 102 mmol/L (ref 101–111)
Creatinine, Ser: 1.19 mg/dL (ref 0.61–1.24)
GFR calc Af Amer: >60 mL/min (ref >60)
GFR calc non Af Amer: 60 mL/min — ABNORMAL LOW (ref >60)
Glucose, Bld: 122 mg/dL — ABNORMAL HIGH (ref 65–99)
Potassium: 4.2 mmol/L (ref 3.5–5.1)
Sodium: 138 mmol/L (ref 135–145)

## 2016-11-16 LAB — ECHOCARDIOGRAM COMPLETE: Weight: 6243.43 oz

## 2016-11-16 MED ORDER — FUROSEMIDE 10 MG/ML IJ SOLN
60.0000 mg | Freq: Two times a day (BID) | INTRAMUSCULAR | Status: DC
  Administered 2016-11-16: 60 mg via INTRAVENOUS
  Filled 2016-11-16: qty 8

## 2016-11-16 MED ORDER — FUROSEMIDE 10 MG/ML IJ SOLN
60.0000 mg | Freq: Two times a day (BID) | INTRAMUSCULAR | Status: DC
  Administered 2016-11-16 – 2016-11-17 (×2): 60 mg via INTRAVENOUS
  Filled 2016-11-16 (×2): qty 8

## 2016-11-16 NOTE — Care Management Note (Signed)
Case Management Note  Patient Details  Name: Dennis Zorn Weakland Sr. MRN: [id number] Date of Birth: April 07, 1946  Subjective/Objective:    Pt admitted with CVA. He is from home with spouse.               Action/Plan: No f/u per PT. OT recommending [place] services. CM following for d/c needs, physician orders.  Expected Discharge Date:                  Expected Discharge Plan:  [place]  In-House Referral:     Discharge planning Services     Post Acute Care Choice:    Choice offered to:     DME Arranged:    DME Agency:     HH Arranged:    [place] Agency:     Status of Service:  In process, will continue to follow  If discussed at Long Length of Stay Meetings, dates discussed:    Additional Comments:  Pollie Friar, RN 11/16/2016, 1:21 PM

## 2016-11-16 NOTE — Progress Notes (Signed)
Echocardiogram 2D Echocardiogram has been performed.  11/16/2016 3:54 PM Maudry Mayhew, BS, RVT, RDCS, RDMS

## 2016-11-16 NOTE — Progress Notes (Signed)
*  PRELIMINARY RESULTS* Vascular Ultrasound Carotid Duplex (Doppler) has been completed.  Preliminary findings: Bilateral: No significant (1-39%) ICA stenosis. Antegrade vertebral flow.   Right lower extremity venous duplex: No evidence of DVT or baker's cyst. Can compare to negative study 11/09/16.   Landry Mellow, RDMS, RVT  11/16/2016, 10:32 AM

## 2016-11-16 NOTE — Progress Notes (Signed)
Physical Therapy Treatment Patient Details Name: Dennis Mcdonagh Bulthuis Sr. MRN: [id number] DOB: 1946-02-15 Today's Date: 11/16/2016    History of Present Illness 71 y.o. male who awoke with a sensation of vertigo, subjective feeling of right facial droop, subjective right arm weakness. Of note, he has bilateral cellulitis in his LEs that he has received treatment. MRI revealed punctate focus of restricted diffusion representative of an acute subcortical white matter infarct in the R frontal lobe, as well as 75-90% stenosis of R M2 MCA.    PT Comments    Pt presented supine in bed with HOB elevated, awake and willing to participate in therapy session. Pt making good progress with mobility and ambulated in hallway with no AD with min guard for safety. PT will continue to follow pt acutely to ensure a safe d/c home.    Follow Up Recommendations  Supervision/Assistance - 24 hour;No PT follow up     Equipment Recommendations  None recommended by PT    Recommendations for Other Services       Precautions / Restrictions Precautions Precautions: Fall Precaution Comments: hx of back pain Restrictions Weight Bearing Restrictions: No    Mobility  Bed Mobility Overal bed mobility: Needs Assistance Bed Mobility: Supine to Sit     Supine to sit: Supervision        Transfers Overall transfer level: Needs assistance Equipment used: None Transfers: Sit to/from Stand Sit to Stand: Min guard         General transfer comment: increased time, close min guard for safety. pt performed sit<> stand from bed x2.  Ambulation/Gait Ambulation/Gait assistance: Min guard Ambulation Distance (Feet): 150 Feet Assistive device: None Gait Pattern/deviations: Step-through pattern;Decreased step length - right;Decreased step length - left;Shuffle Gait velocity: decreased Gait velocity interpretation: Below normal speed for age/gender General Gait Details: pt with no instability or LOB, min guard for  safety   Stairs            Wheelchair Mobility    Modified Rankin (Stroke Patients Only) Modified Rankin (Stroke Patients Only) Pre-Morbid Rankin Score: No symptoms Modified Rankin: Slight disability     Balance Overall balance assessment: Needs assistance Sitting-balance support: Feet supported Sitting balance-Leahy Scale: Good     Standing balance support: During functional activity;No upper extremity supported Standing balance-Leahy Scale: Fair                              Cognition Arousal/Alertness: Awake/alert Behavior During Therapy: WFL for tasks assessed/performed Overall Cognitive Status: Within Functional Limits for tasks assessed                                        Exercises General Exercises - Lower Extremity Ankle Circles/Pumps: AROM;Both;20 reps;Seated    General Comments General comments (skin integrity, edema, etc.): pt with noted edema in bilateral LEs (R>L)       Pertinent Vitals/Pain Pain Assessment: No/denies pain    Home Living     Available Help at Discharge: Family;Available PRN/intermittently Type of Home: House              Prior Function            PT Goals (current goals can now be found in the care plan section) Acute Rehab PT Goals PT Goal Formulation: With patient/family Time For Goal Achievement: 11/29/16 Potential to Achieve Goals:  Good Progress towards PT goals: Progressing toward goals    Frequency    Min 4X/week      PT Plan Current plan remains appropriate;Discharge plan needs to be updated    Co-evaluation              AM-PAC PT "6 Clicks" Daily Activity  Outcome Measure  Difficulty turning over in bed (including adjusting bedclothes, sheets and blankets)?: None Difficulty moving from lying on back to sitting on the side of the bed? : None Difficulty sitting down on and standing up from a chair with arms (e.g., wheelchair, bedside commode, etc,.)?:  None Help needed moving to and from a bed to chair (including a wheelchair)?: A Little Help needed walking in hospital room?: A Little Help needed climbing 3-5 steps with a railing? : A Little 6 Click Score: 21    End of Session Equipment Utilized During Treatment: Gait belt Activity Tolerance: Patient tolerated treatment well Patient left: in chair;with call bell/phone within reach;with family/visitor present Nurse Communication: Mobility status PT Visit Diagnosis: Muscle weakness (generalized) (M62.81)     Time: 0940-1000 PT Time Calculation (min) (ACUTE ONLY): 20 min  Charges:  $Gait Training: 8-22 mins                    G Codes:       [place], [place], [place] Goff 11/16/2016, 10:55 AM

## 2016-11-16 NOTE — Progress Notes (Signed)
STROKE TEAM PROGRESS NOTE   HISTORY OF PRESENT ILLNESS (per record) Dennis Nations Whaling Sr. is an 71 y.o. male with a history of recurrent CVA, chronic back pain, CHF, coronary artery disease, hypothyroidism, hypercholesterolemia, hypertension, bladder cancer who presents with concern for right-sided weakness noted upon waking this morning. Patient reports he was in normal state of health last night when he went to bed at 10 PM 11/14/2016 ([facility]), and this morning when he woke up, he had dizziness, right-sided weakness, sensation of right-sided facial droop. Patient states that he is feeling better at this point time but still feels that the room is spinning no matter if he moves his head or does not move his head. He denies any double vision but states he does have some nausea at this time. He has bilateral cellulitis in his lower legs for which he is taking antibiotics. Patient came to the emergency department with fear that he might be having a stroke as previously he has had similar symptoms when he had a stroke in the past. He has had a CT scan of his head which did not show any acute blood, mass or stroke. He seems to be in discomfort with any type of movement. Patient was not considered for IV t-PA secondary to being out of window. He was admitted for further evaluation and treatment.   SUBJECTIVE (INTERVAL HISTORY) Wife is at bedside. He stated that his right sided weakness has resolved. He still has right LE cellulitis but no DVT on today's test. Denies any left sided symptoms. Denies low BP. Loop recorder test negative.    OBJECTIVE Temp:  [97.7 F (36.5 C)-98.4 F (36.9 C)] 98.1 F (36.7 C) (05/09 0921) Pulse Rate:  [60-75] 66 (05/09 0921) Cardiac Rhythm: Normal sinus rhythm (05/09 0700) Resp:  [16-25] 20 (05/09 0921) BP: (121-150)/(70-95) 137/81 (05/09 0921) SpO2:  [95 %-100 %] 99 % (05/09 0921) Weight:  [177 kg (390 lb 3.4 oz)] 177 kg (390 lb 3.4 oz) (05/09 0400)  CBC:  Recent Labs Lab  11/15/16 1000 11/15/16 1010 11/16/16 0435  WBC 4.9  --  5.2  NEUTROABS 3.4  --   --   HGB 13.5 13.6 13.2  HCT 41.6 40.0 40.8  MCV 84.9  --  85.5  PLT 161  --  622    Basic Metabolic Panel:  Recent Labs Lab 11/15/16 1000 11/15/16 1010 11/16/16 0435  NA 136 138 138  K 3.9 3.9 4.2  CL 104 102 102  CO2 25  --  28  GLUCOSE 141* 138* 122*  BUN 18 20 17   CREATININE 1.16 1.20 1.19  CALCIUM 9.2  --  9.1    Lipid Panel:    Component Value Date/Time   CHOL 151 11/16/2016 0435   TRIG 178 (H) 11/16/2016 0435   HDL 26 (L) 11/16/2016 0435   CHOLHDL 5.8 11/16/2016 0435   VLDL 36 11/16/2016 0435   LDLCALC 89 11/16/2016 0435   HgbA1c:  Lab Results  Component Value Date   HGBA1C 5.8 (H) 01/22/2016   Urine Drug Screen:    Component Value Date/Time   LABOPIA NONE DETECTED 11/15/2016 1148   COCAINSCRNUR NONE DETECTED 11/15/2016 1148   LABBENZ NONE DETECTED 11/15/2016 1148   AMPHETMU NONE DETECTED 11/15/2016 1148   THCU NONE DETECTED 11/15/2016 1148   LABBARB NONE DETECTED 11/15/2016 1148    Alcohol Level     Component Value Date/Time   ETH <5 11/15/2016 1050    IMAGING I have personally reviewed the  radiological images below and agree with the radiology interpretations.  Ct Head Wo Contrast 11/15/2016 Mild atrophy, chronic small vessel disease. No acute intracranial abnormality.   [name] Head Wo Contrast 0/03/9832 Dolichoectatic cerebral vasculature. 75-90% stenosis RIGHT M2 MCA. Estimated 50% stenosis at the origin of the LEFT M1 MCA, with mild poststenotic dilatation.   Mr Brain Wo Contrast 11/15/2016  Punctate focus of restricted diffusion representing an acute subcortical white matter infarct, RIGHT frontal lobe. No hemorrhage. No LEFT hemispheric or brainstem abnormalities are seen to correlate with the subjective RIGHT arm weakness described by the patient.   LE venous doppler, Right - No evidence of DVT or baker's cyst. Can compare to negative study 11/09/16.  CUS  - Bilateral: No significant (1-39%) ICA stenosis. Antegrade vertebral flow.  TTE pending   PHYSICAL EXAM  Temp:  [97.7 F (36.5 C)-98.4 F (36.9 C)] 98.4 F (36.9 C) (05/09 1401) Pulse Rate:  [63-71] 71 (05/09 1401) Resp:  [16-20] 18 (05/09 1401) BP: (121-150)/(70-92) 137/76 (05/09 1401) SpO2:  [95 %-100 %] 98 % (05/09 1401) Weight:  [390 lb 3.4 oz (177 kg)] 390 lb 3.4 oz (177 kg) (05/09 0400)  General - morbid obesity, well developed, in no apparent distress.  Ophthalmologic - Sharp disc margins OU.   Cardiovascular - Regular rate and rhythm.  Mental Status -  Level of arousal and orientation to time, place, and person were intact. Language including expression, naming, repetition, comprehension was assessed and found intact. Fund of Knowledge was assessed and was intact.  Cranial Nerves II - XII - II - Visual field intact OU. III, IV, VI - Extraocular movements intact. V - Facial sensation intact bilaterally. VII - Facial movement intact bilaterally. VIII - Hearing & vestibular intact bilaterally. X - Palate elevates symmetrically. XI - Chin turning & shoulder shrug intact bilaterally. XII - Tongue protrusion intact.  Motor Strength - The patient's strength was normal in all extremities and pronator drift was absent.  Bulk was normal and fasciculations were absent.   Motor Tone - Muscle tone was assessed at the neck and appendages and was normal.  Reflexes - The patient's reflexes were 1+ in all extremities and he had no pathological reflexes.  Sensory - Light touch, temperature/pinprick, vibration and proprioception, and Romberg testing were assessed and were symmetrical.    Coordination - The patient had normal movements in the hands and feet with no ataxia or dysmetria.  Tremor was absent.  Gait and Station - slow pace, small stride, broad based gait, no hemiparetic gait.   ASSESSMENT/PLAN Mr. Dennis Barrick Noller Sr. is a 71 y.o. male with history of recurrent  CVAs, chronic back pain, CHF, coronary artery disease, hypothyroidism, hypercholesterolemia, hypertension, bladder cancer and current LE cellulitis on abx presenting with dizziness, R arm weakness and inability to walk. He did not receive IV t-PA due to delay in arrival.   Transient right sided weakness - TIA vs. Recrudescence of previous stroke Stroke: Incidental punctate R frontal semi-ovale infarct like secondary to small vessel disease source.  Resultant - Right side symptoms resolved  CT head no acute abnormality   MRI  punctate R frontal semi-ovale infarct  MRA  R M2 75-90% stenosis (? Artifact), L M1 50% stenosis at the origin  Carotid Doppler  unremarkable  2D Echo  pending  LE dopplers neg for DVT  TEE 01/2016 showed no PFO  Loop recorder interrogated 11/15/2016, no AF or other rhythm abnormalities  LDL 89  HgbA1c pending  UDS negative  Heparin 5000 units sq tid for VTE prophylaxis  Diet Heart Room service appropriate? Yes; Fluid consistency: Thin  clopidogrel 75 mg daily prior to admission, now on aspirin 325 mg daily and clopidogrel 75 mg daily. Continue DAPT for 3 months and then plavix alone due to intracranial stenosis  Patient counseled to be compliant with his antithrombotic medications  Ongoing aggressive stroke risk factor management  Therapy recommendations:  HHPT, HH OT  Disposition:  pending   Hx stroke/TIA  01/2016 - patchy L MCA and a few tiny R MCA territory infarcts felt to be thromboembolic due to large vessel disease from ICA. 3 days later he had worsening symptoms with increased size of initial left caudate stroke on MRI along with new left motor strip infarct. At that time felt to be cardioembolic and loop recorder placed. TEE neg, and changed ASA to plavix. LDL 149 and A1C 5.8  Followed with Dr. Leonie Man at [facility]  Hypertension  Stable  Permissive hypertension (OK if < 220/120) but gradually normalize in 5-7 days  Long-term BP goal  normotensive  Hyperlipidemia  Home meds:  lipitor 80, resumed in hospital  LDL 89, goal < 70  Continue statin at discharge  Other Stroke Risk Factors  Advanced age  ETOH use, advised to drink no more than 2 drink(s) a day  Morbid Obesity, Body mass index is 43.96 kg/m., recommend weight loss, diet and exercise as appropriate   Family hx stroke (mother)  Coronary artery disease s/p MI  Obstructive sleep apnea, on CPAP at home  Diastolic congestive heart failure  Other Active Problems  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 Please call with questions. Pt will follow up with Dr. Leonie Man at [facility] in about 6 weeks. Thanks for the consult.  Rosalin Hawking, MD PhD Stroke Neurology 11/16/2016 11:51 AM   To contact Stroke Continuity provider, please refer to [email or web address] After hours, contact General Neurology

## 2016-11-16 NOTE — Progress Notes (Addendum)
[facility] PROGRESS NOTE  Dennis [name] Sr. ZHG:[id number] DOB: 09/05/1945 DOA: 11/15/2016   PCP: Jerrol Banana., MD     Assessment/Plan: Active Problems:   Diastolic CHF (Dennis [name])   Benign prostatic hyperplasia with urinary obstruction   Acquired hypothyroidism   Chronic tophaceous gout   Essential hypertension   Adrenal insufficiency (HCC)   Ischemic stroke (HCC)   Localized swelling of lower extremity   Cellulitis of right lower extremity   Addison's disease (Dennis [name])    71 y.o. male with a history of CVA, chronic back pain, CHF, coronary artery disease, hypothyroidism, hypercholesterolemia, hypertension, bladder cancer who presents with concern for right-sided weakness noted upon waking this morning. Patient reports he was in normal state of health last night when he went to bed at 10 PM, and this morning when he woke up, he had dizziness, right-sided weakness, sensation of right-sided facial droop.Patient reports right lower extremity cellulitis improving since being seen in the ED on 11/09/2016 and being started on clindamycin. Right lower extremity remains swollen and has been even before onset of cellulitis. Admitted for stroke workup next  Assessment and plan  Acute CVA MRI confirms:acute subcortical white matter infarct, RIGHT frontal lobe  Dizziness and R sided extremity weakness w/ minimal R facial droop. CT w/o acute process noted. h/o similar strokes in past w/o permanent deficits.   MRI/MRA, Carotid dopplers within normal limits, Echo pending  Permissive HTN LDL 89, triglycerides 178, Hemoglobin A1c pending  PT/OT-no PT follow-up/SLP-regular diet  Neuro checks - Formal Neuro consult. - Continue ASA, Plavix, Statin  RLE cellulitis: seeni n ED on 5/2 and started on clinda w/ subjective improvement per family.  - continue clindamycin  Right lower extremity swelling: present from before onset of cellulitis. May be due to chronic venous insufficiency  worsened by cellulitis vs DVT.  venous Doppler negative for DVT  Suspect chronic venous stasis , -will start iv lasix to help with dependent edema     HTN: - hold home antihypertensives for permissive HTN - Hydralazine prn  Acute on chronic Diastolic congestive heart failure: Last echo showing an EF 55% and grade 2 diastolic dysfunction. - I's and O's, daily weights Diuresis with iv lasix   Addisons disease: - continue hydrocortisone.   BPH: - continue flomax  Gout: - continue allopurinol, colchicine  Depression: - continue lexapro  Hypothyroid: - continue synthroid  Chronic pain: - continue norco, Robaxin, Indocin  Hypogonadism:  - resume clomid at time of discharge    DVT prophylaxsis heparin  Code Status:  Full code     Family Communication: Discussed in detail with the patient, all imaging results, lab results explained to the patient   Disposition Plan: Continue stroke workup      Consultants:  Neurology  Procedures:  None  Antibiotics: Anti-infectives    Start     Dose/Rate Route Frequency Ordered Stop   11/15/16 1600  clindamycin (CLEOCIN) capsule 450 mg     450 mg Oral 3 times daily 11/15/16 1243           HPI/Subjective: Right facial droop improved, no slurred speech  Objective: Vitals:   11/16/16 0330 11/16/16 0400 11/16/16 0604 11/16/16 0921  BP: 140/79  124/73 137/81  Pulse: 65  65 66  Resp: 16  18 20   Temp: 97.9 F (36.6 C)  97.9 F (36.6 C) 98.1 F (36.7 C)  TempSrc: Oral  Oral Oral  SpO2: 100%  98% 99%  Weight:  Marland Kitchen)  177 kg (390 lb 3.4 oz)      Intake/Output Summary (Last 24 hours) at 11/16/16 1123 Last data filed at 11/16/16 1000  Gross per 24 hour  Intake              840 ml  Output             1450 ml  Net             -610 ml    Exam:  Examination:  General exam: Appears calm and comfortable  Respiratory system: Clear to auscultation. Respiratory effort normal. Cardiovascular system: S1 & S2  heard, RRR. No JVD, murmurs, rubs, gallops or clicks.   Gastrointestinal system: Abdomen is nondistended, soft and nontender. No organomegaly or masses felt. Normal bowel sounds heard. Central nervous system: Alert and oriented. No focal neurological deficits. Extremities: Symmetric 5 x 5 power. 1+ pitting edema in bilateral lower extremities Skin: No rashes, lesions or ulcers Psychiatry: Judgement and insight appear normal. Mood & affect appropriate.     Data Reviewed: I have personally reviewed following labs and imaging studies  Micro Results No results found for this or any previous visit (from the past 240 hour(s)).  Radiology Reports Dg Chest 2 View  Result Date: 11/09/2016 CLINICAL DATA:  Shortness of breath EXAM: CHEST  2 VIEW COMPARISON:  08/13/2015 FINDINGS: Borderline heart size, stable. Stable mild aortic tortuosity. Implantable loop recorder present. Bilateral extrapleural thickening at the bases which has also been seen on priors and is attributed to extrapleural fat based on 10/06/2016 abdominal CT. There is no edema, consolidation, effusion, or pneumothorax. Spondylosis. No acute osseous finding. IMPRESSION: No evidence of active disease. Electronically Signed   By: Monte Fantasia M.D.   On: 11/09/2016 14:39   Ct Head Wo Contrast  Result Date: 11/15/2016 CLINICAL DATA:  Dizziness, right side headache. EXAM: CT HEAD WITHOUT CONTRAST TECHNIQUE: Contiguous axial images were obtained from the base of the skull through the vertex without intravenous contrast. COMPARISON:  03/19/2016 FINDINGS: Brain: No acute intracranial abnormality. Specifically, no hemorrhage, hydrocephalus, mass lesion, acute infarction, or significant intracranial injury. There is atrophy and chronic small vessel disease changes. Vascular: No hyperdense vessel or unexpected calcification. Skull: No acute calvarial abnormality. Sinuses/Orbits: Mucosal thickening in the ethmoid air cells. Remainder the paranasal  sinuses are clear. Mastoid air cells are clear. Orbital soft tissues unremarkable. Other:  None IMPRESSION: Mild atrophy, chronic small vessel disease. No acute intracranial abnormality. Electronically Signed   By: Rolm Baptise M.D.   On: 11/15/2016 10:53   Mr Dennis [name] CV Contrast  Result Date: 11/15/2016 CLINICAL DATA:  71 y.o. male who awoke with a sensation of vertigo, subjective feeling of right facial droop, subjective right arm weakness. Exam shows inconsistencies with poor effort. Stroke risk factors include hyperlipidemia and hypertension. EXAM: MRI HEAD WITHOUT CONTRAST MRA HEAD WITHOUT CONTRAST TECHNIQUE: Multiplanar, multiecho pulse sequences of the brain and surrounding structures were obtained without intravenous contrast. Angiographic images of the head were obtained using MRA technique without contrast. COMPARISON:  CT head earlier today. FINDINGS: MRI HEAD FINDINGS Brain: Prominent retrocerebellar CSF collection, representing either mega cisterna magna or broad-based arachnoid cyst, stable. Punctate focus of restricted diffusion, approximately 2 mm, RIGHT frontal subcortical white matter, series 3, image 35, consistent with acute infarction. No hemorrhage, mass lesion, or extra-axial fluid. Moderate atrophy with early hydrocephalus ex vacuo. Moderately extensive T2 and FLAIR hyperintensities throughout the white matter, representing small vessel disease. LEFT thalamus and LEFT caudate lacunar infarcts  are chronic in nature. Vascular: Flow voids are maintained.  Moderate dolichoectasia. Skull and upper cervical spine: Normal marrow signal. Sinuses/Orbits: Negative. Other: None. MRA HEAD FINDINGS The internal carotid arteries are widely patent. The basilar artery is widely patent. LEFT vertebral is dominant. There is is moderate dolichoectasia, particularly of the basilar artery. There is no focal stenosis of the anterior or posterior cerebral arteries. The RIGHT M1 MCA is widely patent. The  inferior M2 division RIGHT MCA demonstrates an estimated 75-90% stenosis. The LEFT M1 MCA demonstrates a 50% stenosis at its origin. There appears to be mild poststenotic dilatation. No MCA branch occlusion on the LEFT is observed. IMPRESSION: Punctate focus of restricted diffusion representing an acute subcortical white matter infarct, RIGHT frontal lobe. No hemorrhage. No LEFT hemispheric or brainstem abnormalities are seen to correlate with the subjective RIGHT arm weakness described by the patient. Dolichoectatic cerebral vasculature. 75-90% stenosis RIGHT M2 MCA. Estimated 50% stenosis at the origin of the LEFT M1 MCA, with mild poststenotic dilatation. Electronically Signed   By: Staci Righter M.D.   On: 11/15/2016 21:31   Mr Brain Wo Contrast  Result Date: 11/15/2016 CLINICAL DATA:  71 y.o. male who awoke with a sensation of vertigo, subjective feeling of right facial droop, subjective right arm weakness. Exam shows inconsistencies with poor effort. Stroke risk factors include hyperlipidemia and hypertension. EXAM: MRI HEAD WITHOUT CONTRAST MRA HEAD WITHOUT CONTRAST TECHNIQUE: Multiplanar, multiecho pulse sequences of the brain and surrounding structures were obtained without intravenous contrast. Angiographic images of the head were obtained using MRA technique without contrast. COMPARISON:  CT head earlier today. FINDINGS: MRI HEAD FINDINGS Brain: Prominent retrocerebellar CSF collection, representing either mega cisterna magna or broad-based arachnoid cyst, stable. Punctate focus of restricted diffusion, approximately 2 mm, RIGHT frontal subcortical white matter, series 3, image 35, consistent with acute infarction. No hemorrhage, mass lesion, or extra-axial fluid. Moderate atrophy with early hydrocephalus ex vacuo. Moderately extensive T2 and FLAIR hyperintensities throughout the white matter, representing small vessel disease. LEFT thalamus and LEFT caudate lacunar infarcts are chronic in nature.  Vascular: Flow voids are maintained.  Moderate dolichoectasia. Skull and upper cervical spine: Normal marrow signal. Sinuses/Orbits: Negative. Other: None. MRA HEAD FINDINGS The internal carotid arteries are widely patent. The basilar artery is widely patent. LEFT vertebral is dominant. There is is moderate dolichoectasia, particularly of the basilar artery. There is no focal stenosis of the anterior or posterior cerebral arteries. The RIGHT M1 MCA is widely patent. The inferior M2 division RIGHT MCA demonstrates an estimated 75-90% stenosis. The LEFT M1 MCA demonstrates a 50% stenosis at its origin. There appears to be mild poststenotic dilatation. No MCA branch occlusion on the LEFT is observed. IMPRESSION: Punctate focus of restricted diffusion representing an acute subcortical white matter infarct, RIGHT frontal lobe. No hemorrhage. No LEFT hemispheric or brainstem abnormalities are seen to correlate with the subjective RIGHT arm weakness described by the patient. Dolichoectatic cerebral vasculature. 75-90% stenosis RIGHT M2 MCA. Estimated 50% stenosis at the origin of the LEFT M1 MCA, with mild poststenotic dilatation. Electronically Signed   By: Staci Righter M.D.   On: 11/15/2016 21:31   Ct Renal Stone Study  Result Date: 11/09/2016 CLINICAL DATA:  Urinary retention with lower leg swelling history of kidney stones EXAM: CT ABDOMEN AND PELVIS WITHOUT CONTRAST TECHNIQUE: Multidetector CT imaging of the abdomen and pelvis was performed following the standard protocol without IV contrast. COMPARISON:  10/06/2016 FINDINGS: Lower chest: Lung bases demonstrate no acute consolidation or pleural  effusion. The heart is nonenlarged. Hepatobiliary: No focal liver abnormality is seen. No gallstones, gallbladder wall thickening, or biliary dilatation. Pancreas: Unremarkable. No pancreatic ductal dilatation or surrounding inflammatory changes. Spleen: Elongated appearance of the spleen, measuring up to 17 cm on the axial  images. Adrenals/Urinary Tract: Adrenal glands are within normal limits. No hydronephrosis. No ureteral stone. Bladder within normal limits. Stomach/Bowel: Stomach is within normal limits. Appendix appears normal. No evidence of bowel wall thickening, distention, or inflammatory changes. Vascular/Lymphatic: Aortic atherosclerosis. No enlarged abdominal or pelvic lymph nodes. Nonspecific subcentimeter mesenteric root lymph nodes. Reproductive: Prostate is unremarkable. Other: Small fat within the inguinal canals. No free air or free fluid. Musculoskeletal: Postsurgical scarring in the midline back. Post- operative changes of the lumbar spine with interbody disc devices at L2-L3, L3-L4 and L4-L5. Single fixating screw on the left at L2, bilateral fixating screws at L3 and L4 and L5. Posterior stabilization rods at L3-L4. IMPRESSION: 1. Negative for hydronephrosis or ureteral stone. 2. There are no acute abnormalities visualized Electronically Signed   By: Donavan Foil M.D.   On: 11/09/2016 21:01     CBC  Recent Labs Lab 11/09/16 1520 11/15/16 1000 11/15/16 1010 11/16/16 0435  WBC 5.5 4.9  --  5.2  HGB 13.2 13.5 13.6 13.2  HCT 40.5 41.6 40.0 40.8  PLT 153 161  --  173  MCV 86.0 84.9  --  85.5  MCH 28.0 27.6  --  27.7  MCHC 32.6 32.5  --  32.4  RDW 13.4 13.3  --  13.4  LYMPHSABS  --  1.1  --   --   MONOABS  --  0.3  --   --   EOSABS  --  0.1  --   --   BASOSABS  --  0.0  --   --     Chemistries   Recent Labs Lab 11/09/16 1520 11/15/16 1000 11/15/16 1010 11/16/16 0435  NA 134* 136 138 138  K 3.5 3.9 3.9 4.2  CL 102 104 102 102  CO2 25 25  --  28  GLUCOSE 117* 141* 138* 122*  BUN 16 18 20 17   CREATININE 1.20 1.16 1.20 1.19  CALCIUM 9.2 9.2  --  9.1  AST  --  20  --   --   ALT  --  17  --   --   ALKPHOS  --  55  --   --   BILITOT  --  0.9  --   --    ------------------------------------------------------------------------------------------------------------------ estimated  creatinine clearance is 102.3 mL/min (by C-G formula based on SCr of 1.19 mg/dL). ------------------------------------------------------------------------------------------------------------------ No results for input(s): HGBA1C in the last 72 hours. ------------------------------------------------------------------------------------------------------------------  Recent Labs  11/16/16 0435  CHOL 151  HDL 26*  LDLCALC 89  TRIG 178*  CHOLHDL 5.8   ------------------------------------------------------------------------------------------------------------------ No results for input(s): TSH, T4TOTAL, T3FREE, THYROIDAB in the last 72 hours.  Invalid input(s): FREET3 ------------------------------------------------------------------------------------------------------------------ No results for input(s): VITAMINB12, FOLATE, FERRITIN, TIBC, IRON, RETICCTPCT in the last 72 hours.  Coagulation profile  Recent Labs Lab 11/15/16 1000  INR 1.09    No results for input(s): DDIMER in the last 72 hours.  Cardiac Enzymes No results for input(s): CKMB, TROPONINI, MYOGLOBIN in the last 168 hours.  Invalid input(s): CK ------------------------------------------------------------------------------------------------------------------ Invalid input(s): POCBNP   CBG: No results for input(s): GLUCAP in the last 168 hours.     Studies: Ct Head Wo Contrast  Result Date: 11/15/2016 CLINICAL DATA:  Dizziness, right side headache.  EXAM: CT HEAD WITHOUT CONTRAST TECHNIQUE: Contiguous axial images were obtained from the base of the skull through the vertex without intravenous contrast. COMPARISON:  03/19/2016 FINDINGS: Brain: No acute intracranial abnormality. Specifically, no hemorrhage, hydrocephalus, mass lesion, acute infarction, or significant intracranial injury. There is atrophy and chronic small vessel disease changes. Vascular: No hyperdense vessel or unexpected calcification. Skull: No  acute calvarial abnormality. Sinuses/Orbits: Mucosal thickening in the ethmoid air cells. Remainder the paranasal sinuses are clear. Mastoid air cells are clear. Orbital soft tissues unremarkable. Other:  None IMPRESSION: Mild atrophy, chronic small vessel disease. No acute intracranial abnormality. Electronically Signed   By: Rolm Baptise M.D.   On: 11/15/2016 10:53   Mr Dennis [name] ZO Contrast  Result Date: 11/15/2016 CLINICAL DATA:  71 y.o. male who awoke with a sensation of vertigo, subjective feeling of right facial droop, subjective right arm weakness. Exam shows inconsistencies with poor effort. Stroke risk factors include hyperlipidemia and hypertension. EXAM: MRI HEAD WITHOUT CONTRAST MRA HEAD WITHOUT CONTRAST TECHNIQUE: Multiplanar, multiecho pulse sequences of the brain and surrounding structures were obtained without intravenous contrast. Angiographic images of the head were obtained using MRA technique without contrast. COMPARISON:  CT head earlier today. FINDINGS: MRI HEAD FINDINGS Brain: Prominent retrocerebellar CSF collection, representing either mega cisterna magna or broad-based arachnoid cyst, stable. Punctate focus of restricted diffusion, approximately 2 mm, RIGHT frontal subcortical white matter, series 3, image 35, consistent with acute infarction. No hemorrhage, mass lesion, or extra-axial fluid. Moderate atrophy with early hydrocephalus ex vacuo. Moderately extensive T2 and FLAIR hyperintensities throughout the white matter, representing small vessel disease. LEFT thalamus and LEFT caudate lacunar infarcts are chronic in nature. Vascular: Flow voids are maintained.  Moderate dolichoectasia. Skull and upper cervical spine: Normal marrow signal. Sinuses/Orbits: Negative. Other: None. MRA HEAD FINDINGS The internal carotid arteries are widely patent. The basilar artery is widely patent. LEFT vertebral is dominant. There is is moderate dolichoectasia, particularly of the basilar artery. There  is no focal stenosis of the anterior or posterior cerebral arteries. The RIGHT M1 MCA is widely patent. The inferior M2 division RIGHT MCA demonstrates an estimated 75-90% stenosis. The LEFT M1 MCA demonstrates a 50% stenosis at its origin. There appears to be mild poststenotic dilatation. No MCA branch occlusion on the LEFT is observed. IMPRESSION: Punctate focus of restricted diffusion representing an acute subcortical white matter infarct, RIGHT frontal lobe. No hemorrhage. No LEFT hemispheric or brainstem abnormalities are seen to correlate with the subjective RIGHT arm weakness described by the patient. Dolichoectatic cerebral vasculature. 75-90% stenosis RIGHT M2 MCA. Estimated 50% stenosis at the origin of the LEFT M1 MCA, with mild poststenotic dilatation. Electronically Signed   By: Staci Righter M.D.   On: 11/15/2016 21:31   Mr Brain Wo Contrast  Result Date: 11/15/2016 CLINICAL DATA:  71 y.o. male who awoke with a sensation of vertigo, subjective feeling of right facial droop, subjective right arm weakness. Exam shows inconsistencies with poor effort. Stroke risk factors include hyperlipidemia and hypertension. EXAM: MRI HEAD WITHOUT CONTRAST MRA HEAD WITHOUT CONTRAST TECHNIQUE: Multiplanar, multiecho pulse sequences of the brain and surrounding structures were obtained without intravenous contrast. Angiographic images of the head were obtained using MRA technique without contrast. COMPARISON:  CT head earlier today. FINDINGS: MRI HEAD FINDINGS Brain: Prominent retrocerebellar CSF collection, representing either mega cisterna magna or broad-based arachnoid cyst, stable. Punctate focus of restricted diffusion, approximately 2 mm, RIGHT frontal subcortical white matter, series 3, image 35, consistent with acute infarction. No hemorrhage,  mass lesion, or extra-axial fluid. Moderate atrophy with early hydrocephalus ex vacuo. Moderately extensive T2 and FLAIR hyperintensities throughout the white matter,  representing small vessel disease. LEFT thalamus and LEFT caudate lacunar infarcts are chronic in nature. Vascular: Flow voids are maintained.  Moderate dolichoectasia. Skull and upper cervical spine: Normal marrow signal. Sinuses/Orbits: Negative. Other: None. MRA HEAD FINDINGS The internal carotid arteries are widely patent. The basilar artery is widely patent. LEFT vertebral is dominant. There is is moderate dolichoectasia, particularly of the basilar artery. There is no focal stenosis of the anterior or posterior cerebral arteries. The RIGHT M1 MCA is widely patent. The inferior M2 division RIGHT MCA demonstrates an estimated 75-90% stenosis. The LEFT M1 MCA demonstrates a 50% stenosis at its origin. There appears to be mild poststenotic dilatation. No MCA branch occlusion on the LEFT is observed. IMPRESSION: Punctate focus of restricted diffusion representing an acute subcortical white matter infarct, RIGHT frontal lobe. No hemorrhage. No LEFT hemispheric or brainstem abnormalities are seen to correlate with the subjective RIGHT arm weakness described by the patient. Dolichoectatic cerebral vasculature. 75-90% stenosis RIGHT M2 MCA. Estimated 50% stenosis at the origin of the LEFT M1 MCA, with mild poststenotic dilatation. Electronically Signed   By: Staci Righter M.D.   On: 11/15/2016 21:31      Lab Results  Component Value Date   HGBA1C 5.8 (H) 01/22/2016   HGBA1C 5.2 08/03/2014   Lab Results  Component Value Date   LDLCALC 89 11/16/2016   CREATININE 1.19 11/16/2016       Scheduled Meds: . allopurinol  100 mg Oral BID  . aspirin  300 mg Rectal Daily   Or  . aspirin  325 mg Oral Daily  . atorvastatin  80 mg Oral Daily  . clindamycin  450 mg Oral TID  . clopidogrel  75 mg Oral Daily  . colchicine  0.6 mg Oral BID  . escitalopram  20 mg Oral Daily  . heparin  5,000 Units Subcutaneous Q8H  . hydrocortisone  10 mg Oral BID  . indomethacin  50 mg Oral BID WC  . levothyroxine  200 mcg  Oral QAC breakfast  . sodium chloride flush  3 mL Intravenous Q12H  . tamsulosin  0.4 mg Oral Daily   Continuous Infusions: . sodium chloride       LOS: 1 day    Time spent: >30 MINS    Reyne Dumas  [facility] Pager [phone]. If 7PM-7AM, please contact night-coverage at [email or web address], password [facility] 11/16/2016, 11:23 AM  LOS: 1 day
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
## 2016-11-16 NOTE — Progress Notes (Signed)
Occupational Therapy Treatment Patient Details Name: Dennis Siems Steeber Sr. MRN: [id number] DOB: Mar 19, 1946 Today's Date: 11/16/2016    History of present illness 71 y.o. male who awoke with a sensation of vertigo, subjective feeling of right facial droop, subjective right arm weakness. Of note, he has bilateral cellulitis in his LEs that he has received treatment. MRI revealed punctate focus of restricted diffusion representative of an acute subcortical white matter infarct in the R frontal lobe, as well as 75-90% stenosis of R M2 MCA.   OT comments  Pt progressing well with goals. Increased strength and improvements in functional mobility noted with Pt completing standing grooming ADLs with MinGuard assist and completing functional mobility without device and with MinGuard assist this session. Educated Pt on squeeze ball strengthening exercises for R hand during session with handout provided and with Pt verbalizing/demonstrating understanding. Considering Pt progression and improvements, DC recommendations updated to 24 hr supervision. Will continue to follow acutely.     Follow Up Recommendations  Supervision/Assistance - 24 hour;No OT follow up    Equipment Recommendations  None recommended by OT (Pt reports having a 3:1 but has not yet need to use it at home)          Precautions / Restrictions Precautions Precautions: Fall Precaution Comments: hx of back pain Restrictions Weight Bearing Restrictions: No       Mobility Bed Mobility Overal bed mobility: Needs Assistance Bed Mobility: Supine to Sit     Supine to sit: Supervision        Transfers Overall transfer level: Needs assistance Equipment used: None Transfers: Sit to/from Stand Sit to Stand: Min guard         General transfer comment: for safety, increased time to complete, Pt independently using UEs to support descent in stand>sit transfer     Balance Overall balance assessment: Needs  assistance Sitting-balance support: Feet supported Sitting balance-Leahy Scale: Good     Standing balance support: During functional activity;No upper extremity supported Standing balance-Leahy Scale: Fair                             ADL either performed or assessed with clinical judgement   ADL Overall ADL's : Needs assistance/impaired Eating/Feeding: Independent   Grooming: Wash/dry hands;Oral care;Min guard;Standing               Lower Body Dressing: Maximal assistance Lower Body Dressing Details (indicate cue type and reason): to don socks, Pt with attempts to don socks (propping LE on bed) however unable to complete secondary to back pain/stiffness; Pt reports he typically does not wear socks and wears slip on shoes, reports he has a reacher at home to assist with donning shorts/pants              Functional mobility during ADLs: Min guard General ADL Comments: Pt completed bed mobility and room level functional mobility this session, LB dressing, completed standing grooming ADLs at sink with MinGuard assist during standing ADLs                       Cognition Arousal/Alertness: Awake/alert Behavior During Therapy: WFL for tasks assessed/performed Overall Cognitive Status: Within Functional Limits for tasks assessed  Exercises Hand Exercises Forearm Supination: AROM;Strengthening;5 reps;Right Forearm Pronation: AROM;Strengthening;5 reps;Right Digit Composite Flexion: AROM;Strengthening;5 reps;Right Composite Extension: AROM;Strengthening;5 reps;Right Digit Composite Abduction: AROM;Strengthening;5 reps;Right Opposition: AROM;Both;5 reps          General Comments      Pertinent Vitals/ Pain       Pain Assessment: Faces Faces Pain Scale: Hurts a little bit Pain Location: back Pain Descriptors / Indicators: Constant;Discomfort Pain Intervention(s): Limited activity within  patient's tolerance                                            Prior Functioning/Environment              Frequency  Min 2X/week        Progress Toward Goals  OT Goals(current goals can now be found in the care plan section)  Progress towards OT goals: Progressing toward goals  Acute Rehab OT Goals Patient Stated Goal: to go home OT Goal Formulation: With patient Time For Goal Achievement: 11/29/16 Potential to Achieve Goals: Good  Plan Discharge plan needs to be updated;Frequency remains appropriate                     AM-PAC PT "6 Clicks" Daily Activity     Outcome Measure   Help from another person eating meals?: None Help from another person taking care of personal grooming?: None Help from another person toileting, which includes using toliet, bedpan, or urinal?: A Little Help from another person bathing (including washing, rinsing, drying)?: A Little Help from another person to put on and taking off regular upper body clothing?: A Little Help from another person to put on and taking off regular lower body clothing?: A Little 6 Click Score: 20    End of Session Equipment Utilized During Treatment: Gait belt  OT Visit Diagnosis: Unsteadiness on feet (R26.81);Muscle weakness (generalized) (M62.81)   Activity Tolerance Patient tolerated treatment well   Patient Left in chair;with call bell/phone within reach;with family/visitor present;with nursing/sitter in room   Nurse Communication Mobility status        Time: 1325-1345 OT Time Calculation (min): 20 min  Charges: OT General Charges $OT Visit: 1 Procedure OT Treatments $Self Care/Home Management : 8-22 mins  Lou Cal, OT Pager [phone] 11/16/2016    Raymondo Band 11/16/2016, 2:17 PM

## 2016-11-17 DIAGNOSIS — E2740 Unspecified adrenocortical insufficiency: Secondary | ICD-10-CM

## 2016-11-17 LAB — COMPREHENSIVE METABOLIC PANEL
ALT: 16 U/L — ABNORMAL LOW (ref 17–63)
AST: 19 U/L (ref 15–41)
Albumin: 3.7 g/dL (ref 3.5–5.0)
Alkaline Phosphatase: 54 U/L (ref 38–126)
Anion gap: 9 (ref 5–15)
BUN: 21 mg/dL — ABNORMAL HIGH (ref 6–20)
CO2: 27 mmol/L (ref 22–32)
Calcium: 9.1 mg/dL (ref 8.9–10.3)
Chloride: 100 mmol/L — ABNORMAL LOW (ref 101–111)
Creatinine, Ser: 1.48 mg/dL — ABNORMAL HIGH (ref 0.61–1.24)
GFR calc Af Amer: 53 mL/min — ABNORMAL LOW (ref >60)
GFR calc non Af Amer: 46 mL/min — ABNORMAL LOW (ref >60)
Glucose, Bld: 134 mg/dL — ABNORMAL HIGH (ref 65–99)
Potassium: 4.2 mmol/L (ref 3.5–5.1)
Sodium: 136 mmol/L (ref 135–145)
Total Bilirubin: 1 mg/dL (ref 0.3–1.2)
Total Protein: 6.3 g/dL — ABNORMAL LOW (ref 6.5–8.1)

## 2016-11-17 LAB — CBC
HCT: 40.1 % (ref 39.0–52.0)
Hemoglobin: 13.2 g/dL (ref 13.0–17.0)
MCH: 27.7 pg (ref 26.0–34.0)
MCHC: 32.9 g/dL (ref 30.0–36.0)
MCV: 84.2 fL (ref 78.0–100.0)
Platelets: 170 10*3/uL (ref 150–400)
RBC: 4.76 MIL/uL (ref 4.22–5.81)
RDW: 13.2 % (ref 11.5–15.5)
WBC: 4.8 10*3/uL (ref 4.0–10.5)

## 2016-11-17 LAB — HEMOGLOBIN A1C
Hgb A1c MFr Bld: 5.7 % — ABNORMAL HIGH (ref 4.8–5.6)
Mean Plasma Glucose: 117 mg/dL

## 2016-11-17 MED ORDER — FUROSEMIDE 20 MG PO TABS: 60.0000 mg | ORAL_TABLET | Freq: Every day | ORAL | 1 refills | Status: DC

## 2016-11-17 MED ORDER — LOSARTAN POTASSIUM 50 MG PO TABS: 50.0000 mg | ORAL_TABLET | Freq: Every day | ORAL | 1 refills | Status: DC

## 2016-11-17 MED ORDER — CLINDAMYCIN HCL 150 MG PO CAPS
450.0000 mg | ORAL_CAPSULE | Freq: Three times a day (TID) | ORAL | 0 refills | Status: AC
Start: 2016-11-17 — End: 2016-11-21

## 2016-11-17 MED ORDER — FUROSEMIDE 40 MG PO TABS: 60.0000 mg | ORAL_TABLET | Freq: Every day | ORAL | Status: DC

## 2016-11-17 MED ORDER — ASPIRIN 325 MG PO TABS: 325.0000 mg | ORAL_TABLET | Freq: Every day | ORAL | 2 refills | Status: DC

## 2016-11-17 NOTE — Progress Notes (Signed)
Discharge instructions reviewed with patient. RX given. All questions answered at this time. Transport home by family.  Ave Filter, RN

## 2016-11-17 NOTE — Discharge Summary (Signed)
Physician Discharge Summary  Dennis Nations Kidd Sr. MRN: [id number] DOB/AGE: 1946/04/05 71 y.o.  PCP: Jerrol Banana., MD   Admit date: 11/15/2016 Discharge date: 11/17/2016  Discharge Diagnoses:    Active Problems:   Diastolic CHF (HCC)   Benign prostatic hyperplasia with urinary obstruction   Acquired hypothyroidism   Chronic tophaceous gout   Essential hypertension   Adrenal insufficiency (HCC)   Ischemic stroke (HCC)   Localized swelling of lower extremity   Cellulitis of right lower extremity   Addison's disease ([place])   History of stroke    Follow-up recommendations Follow-up with PCP in 3-5 days , including all  additional recommended appointments as below Follow-up CBC, CMP in 3-5 days Patient can resume Cozaar after getting his renal panel checked in 3-5 days Discontinue aspirin after 3 months Pt will follow up with Dr. Leonie Man at [facility] in about 6 weeks    Current Discharge Medication List    START taking these medications   Details  aspirin 325 MG tablet Take 1 tablet (325 mg total) by mouth daily. Qty: 30 tablet, Refills: 2      CONTINUE these medications which have CHANGED   Details  clindamycin (CLEOCIN) 150 MG capsule Take 3 capsules (450 mg total) by mouth 3 (three) times daily. Qty: 36 capsule, Refills: 0    furosemide (LASIX) 20 MG tablet Take 3 tablets (60 mg total) by mouth daily. Qty: 30 tablet, Refills: 1    losartan (COZAAR) 50 MG tablet Take 1 tablet (50 mg total) by mouth daily. Qty: 30 tablet, Refills: 1      CONTINUE these medications which have NOT CHANGED   Details  allopurinol (ZYLOPRIM) 100 MG tablet Take 1 tablet (100 mg total) by mouth 2 (two) times daily. Qty: 30 tablet, Refills: 12    atorvastatin (LIPITOR) 80 MG tablet Take 1 tablet (80 mg total) by mouth daily. Qty: 30 tablet, Refills: 12    carvedilol (COREG) 3.125 MG tablet Take 1 tablet (3.125 mg total) by mouth 2 (two) times daily with a meal. Qty: 60 tablet,  Refills: 12    clomiPHENE (CLOMID) 50 MG tablet Half tablet every other day Qty: 15 tablet, Refills: 3    clopidogrel (PLAVIX) 75 MG tablet Take 1 tablet (75 mg total) by mouth daily. Qty: 30 tablet, Refills: 12    colchicine 0.6 MG tablet Take 1 tablet (0.6 mg total) by mouth 2 (two) times daily. Qty: 60 tablet, Refills: 12    EPINEPHrine 0.3 mg/0.3 mL IJ SOAJ injection Inject 0.3 mLs (0.3 mg total) into the muscle as needed (allergic reaction). Qty: 1 Device, Refills: 12    escitalopram (LEXAPRO) 10 MG tablet Take 1 tablet (10 mg total) by mouth daily. Qty: 30 tablet, Refills: 11    hydrocortisone (CORTEF) 10 MG tablet Take 1 tablet (10 mg total) by mouth 2 (two) times daily. Qty: 60 tablet, Refills: 12    levothyroxine (SYNTHROID) 175 MCG tablet Take 1 tablet (175 mcg total) by mouth daily before breakfast. Qty: 30 tablet, Refills: 3    methocarbamol (ROBAXIN) 500 MG tablet Take 1 tablet (500 mg total) by mouth every 6 (six) hours as needed for muscle spasms. Qty: 60 tablet, Refills: 1    ondansetron (ZOFRAN ODT) 4 MG disintegrating tablet '4mg'$  ODT q4 hours prn nausea/vomit Qty: 12 tablet, Refills: 0    oxyCODONE-acetaminophen (PERCOCET) 5-325 MG tablet Take one ever 4-6 hours for pain Qty: 30 tablet, Refills: 0    polyethylene glycol (MIRALAX /  GLYCOLAX) packet Take 17 g by mouth daily as needed for mild constipation.     spironolactone (ALDACTONE) 100 MG tablet Take 100 mg by mouth daily.    tamsulosin (FLOMAX) 0.4 MG CAPS capsule Take 1 capsule (0.4 mg total) by mouth daily. Qty: 10 capsule, Refills: 0    tiZANidine (ZANAFLEX) 4 MG tablet TK 1 T PO TID PRN Refills: 0    Testosterone (ANDRODERM) 2 MG/24HR PT24 1 patch daily Qty: 60 patch, Refills: 2    testosterone (ANDRODERM) 4 MG/24HR PT24 patch 1 patch daily Qty: 30 patch, Refills: 2      STOP taking these medications     indomethacin (INDOCIN) 50 MG capsule      ciprofloxacin (CIPRO) 500 MG tablet       potassium chloride SA (K-DUR,KLOR-CON) 20 MEQ tablet          Discharge Condition: Home with 24-hour supervision   Discharge Instructions Get Medicines reviewed and adjusted: Please take all your medications with you for your next visit with your Primary MD  Please request your Primary MD to go over all hospital tests and procedure/radiological results at the follow up, please ask your Primary MD to get all Hospital records sent to his/her office.  If you experience worsening of your admission symptoms, develop shortness of breath, life threatening emergency, suicidal or homicidal thoughts you must seek medical attention immediately by calling 911 or calling your MD immediately if symptoms less severe.  You must read complete instructions/literature along with all the possible adverse reactions/side effects for all the Medicines you take and that have been prescribed to you. Take any new Medicines after you have completely understood and accpet all the possible adverse reactions/side effects.   Do not drive when taking Pain medications.   Do not take more than prescribed Pain, Sleep and Anxiety Medications  Special Instructions: If you have smoked or chewed Tobacco in the last 2 yrs please stop smoking, stop any regular Alcohol and or any Recreational drug use.  Wear Seat belts while driving.  Please note  You were cared for by a hospitalist during your hospital stay. Once you are discharged, your primary care physician will handle any further medical issues. Please note that NO REFILLS for any discharge medications will be authorized once you are discharged, as it is imperative that you return to your primary care physician (or establish a relationship with a primary care physician if you do not have one) for your aftercare needs so that they can reassess your need for medications and monitor your lab values.  Discharge Instructions    Ambulatory referral to Neurology    Complete  by:  As directed    Pt will follow up with Dr. Leonie Man at [facility] in about 2 months. Thanks.       Allergies  Allergen Reactions  . Bee Venom Anaphylaxis  . Shrimp [Shellfish Allergy] Anaphylaxis and Other (See Comments)    "just shrimp"  . Stadol [Butorphanol] Anaphylaxis and Other (See Comments)    respiratory  Distress, couldn't breathe, cardiac arrest  . Wasp Venom Anaphylaxis      Disposition: 01-Home or Self Care   Consults:  Neurology    Significant Diagnostic Studies:  Dg Chest 2 View  Result Date: 11/09/2016 CLINICAL DATA:  Shortness of breath EXAM: CHEST  2 VIEW COMPARISON:  08/13/2015 FINDINGS: Borderline heart size, stable. Stable mild aortic tortuosity. Implantable loop recorder present. Bilateral extrapleural thickening at the bases which has also been seen on  [employer] and is attributed to extrapleural fat based on 10/06/2016 abdominal CT. There is no edema, consolidation, effusion, or pneumothorax. Spondylosis. No acute osseous finding. IMPRESSION: No evidence of active disease. Electronically Signed   By: Monte Fantasia M.D.   On: 11/09/2016 14:39   Ct Head Wo Contrast  Result Date: 11/15/2016 CLINICAL DATA:  Dizziness, right side headache. EXAM: CT HEAD WITHOUT CONTRAST TECHNIQUE: Contiguous axial images were obtained from the base of the skull through the vertex without intravenous contrast. COMPARISON:  03/19/2016 FINDINGS: Brain: No acute intracranial abnormality. Specifically, no hemorrhage, hydrocephalus, mass lesion, acute infarction, or significant intracranial injury. There is atrophy and chronic small vessel disease changes. Vascular: No hyperdense vessel or unexpected calcification. Skull: No acute calvarial abnormality. Sinuses/Orbits: Mucosal thickening in the ethmoid air cells. Remainder the paranasal sinuses are clear. Mastoid air cells are clear. Orbital soft tissues unremarkable. Other:  None IMPRESSION: Mild atrophy, chronic small vessel disease. No acute  intracranial abnormality. Electronically Signed   By: Rolm Baptise M.D.   On: 11/15/2016 10:53   [name] RU Contrast  Result Date: 11/15/2016 CLINICAL DATA:  71 y.o. male who awoke with a sensation of vertigo, subjective feeling of right facial droop, subjective right arm weakness. Exam shows inconsistencies with poor effort. Stroke risk factors include hyperlipidemia and hypertension. EXAM: MRI HEAD WITHOUT CONTRAST MRA HEAD WITHOUT CONTRAST TECHNIQUE: Multiplanar, multiecho pulse sequences of the brain and surrounding structures were obtained without intravenous contrast. Angiographic images of the head were obtained using MRA technique without contrast. COMPARISON:  CT head earlier today. FINDINGS: MRI HEAD FINDINGS Brain: Prominent retrocerebellar CSF collection, representing either mega cisterna magna or broad-based arachnoid cyst, stable. Punctate focus of restricted diffusion, approximately 2 mm, RIGHT frontal subcortical white matter, series 3, image 35, consistent with acute infarction. No hemorrhage, mass lesion, or extra-axial fluid. Moderate atrophy with early hydrocephalus ex vacuo. Moderately extensive T2 and FLAIR hyperintensities throughout the white matter, representing small vessel disease. LEFT thalamus and LEFT caudate lacunar infarcts are chronic in nature. Vascular: Flow voids are maintained.  Moderate dolichoectasia. Skull and upper cervical spine: Normal marrow signal. Sinuses/Orbits: Negative. Other: None. MRA HEAD FINDINGS The internal carotid arteries are widely patent. The basilar artery is widely patent. LEFT vertebral is dominant. There is is moderate dolichoectasia, particularly of the basilar artery. There is no focal stenosis of the anterior or posterior cerebral arteries. The RIGHT M1 MCA is widely patent. The inferior M2 division RIGHT MCA demonstrates an estimated 75-90% stenosis. The LEFT M1 MCA demonstrates a 50% stenosis at its origin. There appears to be mild  poststenotic dilatation. No MCA branch occlusion on the LEFT is observed. IMPRESSION: Punctate focus of restricted diffusion representing an acute subcortical white matter infarct, RIGHT frontal lobe. No hemorrhage. No LEFT hemispheric or brainstem abnormalities are seen to correlate with the subjective RIGHT arm weakness described by the patient. Dolichoectatic cerebral vasculature. 75-90% stenosis RIGHT M2 MCA. Estimated 50% stenosis at the origin of the LEFT M1 MCA, with mild poststenotic dilatation. Electronically Signed   By: Staci Righter M.D.   On: 11/15/2016 21:31   Mr Brain Wo Contrast  Result Date: 11/15/2016 CLINICAL DATA:  71 y.o. male who awoke with a sensation of vertigo, subjective feeling of right facial droop, subjective right arm weakness. Exam shows inconsistencies with poor effort. Stroke risk factors include hyperlipidemia and hypertension. EXAM: MRI HEAD WITHOUT CONTRAST MRA HEAD WITHOUT CONTRAST TECHNIQUE: Multiplanar, multiecho pulse sequences of the brain and surrounding structures were obtained without  intravenous contrast. Angiographic images of the head were obtained using MRA technique without contrast. COMPARISON:  CT head earlier today. FINDINGS: MRI HEAD FINDINGS Brain: Prominent retrocerebellar CSF collection, representing either mega cisterna magna or broad-based arachnoid cyst, stable. Punctate focus of restricted diffusion, approximately 2 mm, RIGHT frontal subcortical white matter, series 3, image 35, consistent with acute infarction. No hemorrhage, mass lesion, or extra-axial fluid. Moderate atrophy with early hydrocephalus ex vacuo. Moderately extensive T2 and FLAIR hyperintensities throughout the white matter, representing small vessel disease. LEFT thalamus and LEFT caudate lacunar infarcts are chronic in nature. Vascular: Flow voids are maintained.  Moderate dolichoectasia. Skull and upper cervical spine: Normal marrow signal. Sinuses/Orbits: Negative. Other: None. MRA  HEAD FINDINGS The internal carotid arteries are widely patent. The basilar artery is widely patent. LEFT vertebral is dominant. There is is moderate dolichoectasia, particularly of the basilar artery. There is no focal stenosis of the anterior or posterior cerebral arteries. The RIGHT M1 MCA is widely patent. The inferior M2 division RIGHT MCA demonstrates an estimated 75-90% stenosis. The LEFT M1 MCA demonstrates a 50% stenosis at its origin. There appears to be mild poststenotic dilatation. No MCA branch occlusion on the LEFT is observed. IMPRESSION: Punctate focus of restricted diffusion representing an acute subcortical white matter infarct, RIGHT frontal lobe. No hemorrhage. No LEFT hemispheric or brainstem abnormalities are seen to correlate with the subjective RIGHT arm weakness described by the patient. Dolichoectatic cerebral vasculature. 75-90% stenosis RIGHT M2 MCA. Estimated 50% stenosis at the origin of the LEFT M1 MCA, with mild poststenotic dilatation. Electronically Signed   By: Staci Righter M.D.   On: 11/15/2016 21:31   Ct Renal Stone Study  Result Date: 11/09/2016 CLINICAL DATA:  Urinary retention with lower leg swelling history of kidney stones EXAM: CT ABDOMEN AND PELVIS WITHOUT CONTRAST TECHNIQUE: Multidetector CT imaging of the abdomen and pelvis was performed following the standard protocol without IV contrast. COMPARISON:  10/06/2016 FINDINGS: Lower chest: Lung bases demonstrate no acute consolidation or pleural effusion. The heart is nonenlarged. Hepatobiliary: No focal liver abnormality is seen. No gallstones, gallbladder wall thickening, or biliary dilatation. Pancreas: Unremarkable. No pancreatic ductal dilatation or surrounding inflammatory changes. Spleen: Elongated appearance of the spleen, measuring up to 17 cm on the axial images. Adrenals/Urinary Tract: Adrenal glands are within normal limits. No hydronephrosis. No ureteral stone. Bladder within normal limits. Stomach/Bowel:  Stomach is within normal limits. Appendix appears normal. No evidence of bowel wall thickening, distention, or inflammatory changes. Vascular/Lymphatic: Aortic atherosclerosis. No enlarged abdominal or pelvic lymph nodes. Nonspecific subcentimeter mesenteric root lymph nodes. Reproductive: Prostate is unremarkable. Other: Small fat within the inguinal canals. No free air or free fluid. Musculoskeletal: Postsurgical scarring in the midline back. Post- operative changes of the lumbar spine with interbody disc devices at L2-L3, L3-L4 and L4-L5. Single fixating screw on the left at L2, bilateral fixating screws at L3 and L4 and L5. Posterior stabilization rods at L3-L4. IMPRESSION: 1. Negative for hydronephrosis or ureteral stone. 2. There are no acute abnormalities visualized Electronically Signed   By: Donavan Foil M.D.   On: 11/09/2016 21:01    echocardiogram  LV EF: 65% -   70%  ------------------------------------------------------------------- Indications:      CVA 436.  ------------------------------------------------------------------- History:   PMH:   Coronary artery disease.  Risk factors: Hypertension.  ------------------------------------------------------------------- Study Conclusions  - Left ventricle: The cavity size was normal. Wall thickness was   normal. Systolic function was vigorous. The estimated ejection   fraction was  in the range of 65% to 70%. Wall motion was normal;   there were no regional wall motion abnormalities. Doppler   parameters are consistent with abnormal left ventricular   relaxation (grade 1 diastolic dysfunction).      Filed Weights   11/16/16 0400 11/17/16 0500  Weight: (!) 177 kg (390 lb 3.4 oz) (!) 174.5 kg (384 lb 11.2 oz)     Microbiology: No results found for this or any previous visit (from the past 240 hour(s)).     Blood Culture    Component Value Date/Time   SDES FLUID SYNOVIAL 08/08/2015 1108   SDES FLUID SYNOVIAL  08/08/2015 1108   SDES FLUID SYNOVIAL 08/08/2015 1108   SPECREQUEST RIGHT INDEX FINGER POF CIPRO 08/08/2015 1108   SPECREQUEST RIGHT INDEX FINGER POF CIPRO 08/08/2015 1108   SPECREQUEST RIGHT INDEX FINGER POF CIPRO 08/08/2015 1108   CULT NO ANAEROBES ISOLATED 08/08/2015 1108   CULT NO GROWTH 3 DAYS 08/08/2015 1108   CULT  08/08/2015 1108    NO ACID FAST BACILLI ISOLATED IN 6 WEEKS Performed at [facility] 08/13/2015 FINAL 08/08/2015 1108   REPTSTATUS 08/11/2015 FINAL 08/08/2015 1108   REPTSTATUS 09/21/2015 FINAL 08/08/2015 1108      Labs: Results for orders placed or performed during the hospital encounter of 11/15/16 (from the past 48 hour(s))  Protime-INR     Status: None   Collection Time: 11/15/16 10:00 AM  Result Value Ref Range   Prothrombin Time 14.2 11.4 - 15.2 seconds   INR 1.09   APTT     Status: None   Collection Time: 11/15/16 10:00 AM  Result Value Ref Range   aPTT 33 24 - 36 seconds  CBC     Status: None   Collection Time: 11/15/16 10:00 AM  Result Value Ref Range   WBC 4.9 4.0 - 10.5 K/uL   RBC 4.90 4.22 - 5.81 MIL/uL   Hemoglobin 13.5 13.0 - 17.0 g/dL   HCT 41.6 39.0 - 52.0 %   MCV 84.9 78.0 - 100.0 fL   MCH 27.6 26.0 - 34.0 pg   MCHC 32.5 30.0 - 36.0 g/dL   RDW 13.3 11.5 - 15.5 %   Platelets 161 150 - 400 K/uL  Differential     Status: None   Collection Time: 11/15/16 10:00 AM  Result Value Ref Range   Neutrophils Relative % 68 %   Neutro Abs 3.4 1.7 - 7.7 K/uL   Lymphocytes Relative 23 %   Lymphs Abs 1.1 0.7 - 4.0 K/uL   Monocytes Relative 6 %   Monocytes Absolute 0.3 0.1 - 1.0 K/uL   Eosinophils Relative 3 %   Eosinophils Absolute 0.1 0.0 - 0.7 K/uL   Basophils Relative 0 %   Basophils Absolute 0.0 0.0 - 0.1 K/uL  Comprehensive metabolic panel     Status: Abnormal   Collection Time: 11/15/16 10:00 AM  Result Value Ref Range   Sodium 136 135 - 145 mmol/L   Potassium 3.9 3.5 - 5.1 mmol/L   Chloride 104 101 - 111 mmol/L    CO2 25 22 - 32 mmol/L   Glucose, Bld 141 (H) 65 - 99 mg/dL   BUN 18 6 - 20 mg/dL   Creatinine, Ser 1.16 0.61 - 1.24 mg/dL   Calcium 9.2 8.9 - 10.3 mg/dL   Total Protein 6.9 6.5 - 8.1 g/dL   Albumin 3.7 3.5 - 5.0 g/dL   AST 20 15 - 41 U/L  ALT 17 17 - 63 U/L   Alkaline Phosphatase 55 38 - 126 U/L   Total Bilirubin 0.9 0.3 - 1.2 mg/dL   GFR calc non Af Amer >60 >60 mL/min   GFR calc Af Amer >60 >60 mL/min    Comment: (NOTE) The eGFR has been calculated using the CKD EPI equation. This calculation has not been validated in all clinical situations. eGFR's persistently <60 mL/min signify possible Chronic Kidney Disease.    Anion gap 7 5 - 15  I-stat troponin, ED (not at [facility], [facility])     Status: None   Collection Time: 11/15/16 10:08 AM  Result Value Ref Range   Troponin i, poc 0.00 0.00 - 0.08 ng/mL   Comment 3            Comment: Due to the release kinetics of cTnI, a negative result within the first hours of the onset of symptoms does not rule out myocardial infarction with certainty. If myocardial infarction is still suspected, repeat the test at appropriate intervals.   I-Stat Chem 8, ED  (not at [facility], [facility])     Status: Abnormal   Collection Time: 11/15/16 10:10 AM  Result Value Ref Range   Sodium 138 135 - 145 mmol/L   Potassium 3.9 3.5 - 5.1 mmol/L   Chloride 102 101 - 111 mmol/L   BUN 20 6 - 20 mg/dL   Creatinine, Ser 1.20 0.61 - 1.24 mg/dL   Glucose, Bld 138 (H) 65 - 99 mg/dL   Calcium, Ion 1.13 (L) 1.15 - 1.40 mmol/L   TCO2 26 0 - 100 mmol/L   Hemoglobin 13.6 13.0 - 17.0 g/dL   HCT 40.0 39.0 - 52.0 %  Ethanol     Status: None   Collection Time: 11/15/16 10:50 AM  Result Value Ref Range   Alcohol, Ethyl (B) <5 <5 mg/dL    Comment:        LOWEST DETECTABLE LIMIT FOR SERUM ALCOHOL IS 5 mg/dL FOR MEDICAL PURPOSES ONLY   Urine rapid drug screen (hosp performed)not at [facility]     Status: None   Collection Time: 11/15/16 11:48 AM  Result Value Ref Range   Opiates  NONE DETECTED NONE DETECTED   Cocaine NONE DETECTED NONE DETECTED   Benzodiazepines NONE DETECTED NONE DETECTED   Amphetamines NONE DETECTED NONE DETECTED   Tetrahydrocannabinol NONE DETECTED NONE DETECTED   Barbiturates NONE DETECTED NONE DETECTED    Comment:        DRUG SCREEN FOR MEDICAL PURPOSES ONLY.  IF CONFIRMATION IS NEEDED FOR ANY PURPOSE, NOTIFY LAB WITHIN 5 DAYS.        LOWEST DETECTABLE LIMITS FOR URINE DRUG SCREEN Drug Class       Cutoff (ng/mL) Amphetamine      1000 Barbiturate      200 Benzodiazepine   623 Tricyclics       762 Opiates          300 Cocaine          300 THC              50   Urinalysis, Routine w reflex microscopic     Status: None   Collection Time: 11/15/16 11:48 AM  Result Value Ref Range   Color, Urine YELLOW YELLOW   APPearance CLEAR CLEAR   Specific Gravity, Urine 1.010 1.005 - 1.030   pH 6.0 5.0 - 8.0   Glucose, UA NEGATIVE NEGATIVE mg/dL   Hgb urine dipstick NEGATIVE NEGATIVE   Bilirubin  Urine NEGATIVE NEGATIVE   Ketones, ur NEGATIVE NEGATIVE mg/dL   Protein, ur NEGATIVE NEGATIVE mg/dL   Nitrite NEGATIVE NEGATIVE   Leukocytes, UA NEGATIVE NEGATIVE  Hemoglobin A1c     Status: Abnormal   Collection Time: 11/16/16  4:35 AM  Result Value Ref Range   Hgb A1c MFr Bld 5.7 (H) 4.8 - 5.6 %    Comment: (NOTE)         Pre-diabetes: 5.7 - 6.4         Diabetes: >6.4         Glycemic control for adults with diabetes: <7.0    Mean Plasma Glucose 117 mg/dL    Comment: (NOTE) Performed At: [facility] [place], [place] [id number] Lindon Romp MD SN:[id number]   CBC     Status: None   Collection Time: 11/16/16  4:35 AM  Result Value Ref Range   WBC 5.2 4.0 - 10.5 K/uL   RBC 4.77 4.22 - 5.81 MIL/uL   Hemoglobin 13.2 13.0 - 17.0 g/dL   HCT 40.8 39.0 - 52.0 %   MCV 85.5 78.0 - 100.0 fL   MCH 27.7 26.0 - 34.0 pg   MCHC 32.4 30.0 - 36.0 g/dL   RDW 13.4 11.5 - 15.5 %   Platelets 173 150 - 400 K/uL  Basic  metabolic panel     Status: Abnormal   Collection Time: 11/16/16  4:35 AM  Result Value Ref Range   Sodium 138 135 - 145 mmol/L   Potassium 4.2 3.5 - 5.1 mmol/L   Chloride 102 101 - 111 mmol/L   CO2 28 22 - 32 mmol/L   Glucose, Bld 122 (H) 65 - 99 mg/dL   BUN 17 6 - 20 mg/dL   Creatinine, Ser 1.19 0.61 - 1.24 mg/dL   Calcium 9.1 8.9 - 10.3 mg/dL   GFR calc non Af Amer 60 (L) >60 mL/min   GFR calc Af Amer >60 >60 mL/min    Comment: (NOTE) The eGFR has been calculated using the CKD EPI equation. This calculation has not been validated in all clinical situations. eGFR's persistently <60 mL/min signify possible Chronic Kidney Disease.    Anion gap 8 5 - 15  Lipid panel     Status: Abnormal   Collection Time: 11/16/16  4:35 AM  Result Value Ref Range   Cholesterol 151 0 - 200 mg/dL   Triglycerides 178 (H) <150 mg/dL   HDL 26 (L) >40 mg/dL   Total CHOL/HDL Ratio 5.8 RATIO   VLDL 36 0 - 40 mg/dL   LDL Cholesterol 89 0 - 99 mg/dL    Comment:        Total Cholesterol/HDL:CHD Risk Coronary Heart Disease Risk Table                     Men   Women  1/2 Average Risk   3.4   3.3  Average Risk       5.0   4.4  2 X Average Risk   9.6   7.1  3 X Average Risk  23.4   11.0        Use the calculated Patient Ratio above and the CHD Risk Table to determine the patient's CHD Risk.        ATP III CLASSIFICATION (LDL):  <100     mg/dL   Optimal  100-129  mg/dL   Near or Above  Optimal  130-159  mg/dL   Borderline  160-189  mg/dL   High  >190     mg/dL   Very High   Comprehensive metabolic panel     Status: Abnormal   Collection Time: 11/17/16  3:11 AM  Result Value Ref Range   Sodium 136 135 - 145 mmol/L   Potassium 4.2 3.5 - 5.1 mmol/L   Chloride 100 (L) 101 - 111 mmol/L   CO2 27 22 - 32 mmol/L   Glucose, Bld 134 (H) 65 - 99 mg/dL   BUN 21 (H) 6 - 20 mg/dL   Creatinine, Ser 1.48 (H) 0.61 - 1.24 mg/dL   Calcium 9.1 8.9 - 10.3 mg/dL   Total Protein 6.3 (L) 6.5 -  8.1 g/dL   Albumin 3.7 3.5 - 5.0 g/dL   AST 19 15 - 41 U/L   ALT 16 (L) 17 - 63 U/L   Alkaline Phosphatase 54 38 - 126 U/L   Total Bilirubin 1.0 0.3 - 1.2 mg/dL   GFR calc non Af Amer 46 (L) >60 mL/min   GFR calc Af Amer 53 (L) >60 mL/min    Comment: (NOTE) The eGFR has been calculated using the CKD EPI equation. This calculation has not been validated in all clinical situations. eGFR's persistently <60 mL/min signify possible Chronic Kidney Disease.    Anion gap 9 5 - 15  CBC     Status: None   Collection Time: 11/17/16  3:11 AM  Result Value Ref Range   WBC 4.8 4.0 - 10.5 K/uL   RBC 4.76 4.22 - 5.81 MIL/uL   Hemoglobin 13.2 13.0 - 17.0 g/dL   HCT 40.1 39.0 - 52.0 %   MCV 84.2 78.0 - 100.0 fL   MCH 27.7 26.0 - 34.0 pg   MCHC 32.9 30.0 - 36.0 g/dL   RDW 13.2 11.5 - 15.5 %   Platelets 170 150 - 400 K/uL     Lipid Panel     Component Value Date/Time   CHOL 151 11/16/2016 0435   TRIG 178 (H) 11/16/2016 0435   HDL 26 (L) 11/16/2016 0435   CHOLHDL 5.8 11/16/2016 0435   VLDL 36 11/16/2016 0435   LDLCALC 89 11/16/2016 0435     Lab Results  Component Value Date   HGBA1C 5.7 (H) 11/16/2016   HGBA1C 5.8 (H) 01/22/2016   HGBA1C 5.2 08/03/2014       HPI :   71 y.o.malewith a history of CVA, chronic back pain, CHF, coronary artery disease, hypothyroidism, hypercholesterolemia, hypertension, bladder cancer who presents with concern for right-sided weakness noted upon waking this morning. Patient reports he was in normal state of health last night when he went to bed at 10 PM, and this morning when he woke up, he had dizziness, right-sided weakness, sensation of right-sided facial droop.Patient reports right lower extremity cellulitis improving since being seen in the ED on 11/09/2016 and being started on clindamycin. Right lower extremity remains swollen and has been even before onset of cellulitis. Admitted for stroke workup  .He has had a CT scan of his head which did not  show any acute blood, mass or stroke. He seems to be in discomfort with any type of movement. Patient was not considered for IV t-PA secondary to being out of window. He was admitted for further evaluation and treatment  HOSPITAL COURSE:   Transient right sided weakness - TIA vs. Recrudescence of previous stroke Stroke: Incidental punctate R frontal semi-ovale infarct like secondary to  small vessel disease source.  Resultant - Right side symptoms resolved  CT head no acute abnormality   MRI  punctate R frontal semi-ovale infarct  MRA  R M2 75-90% stenosis (? Artifact), L M1 50% stenosis at the origin  Carotid Doppler  unremarkable  2D Echo   as above  LE dopplers neg for DVT  TEE 01/2016 showed no PFO  Loop recorder interrogated 11/15/2016, no AF or other rhythm abnormalities  LDL 89  HgbA1c  5.7  UDS negative   Diet Heart Room service appropriate? Yes; Fluid consistency: Thin  clopidogrel 75 mg daily prior to admission, now on aspirin 325 mg daily and clopidogrel 75 mg daily. Continue DAPT for 3 months and then plavix alone due to intracranial stenosis  Therapy recommendations:  HHPT, HH OT  Disposition:   home with wife  Hx stroke/TIA  01/2016 - patchy L MCA and a few tiny R MCA territory infarcts felt to be thromboembolic due to large vessel disease from ICA. 3 days later he had worsening symptoms with increased size of initial left caudate stroke on MRI along with new left motor strip infarct. At that time felt to be cardioembolic and loop recorder placed. TEE neg, and changed ASA to plavix. LDL 149 and A1C 5.8  Followed with Dr. Leonie Man at [facility]    Morbid Obesity, Body mass index is 43.96 kg/m., recommend weight loss, diet and exercise as appropriate   Suspected RLE cellulitis: seeni n ED on 5/2 and started on clinda w/ subjective improvement per family.  - continue clindamycin for another 4 days and then discontinue   Right lower extremity swelling: present from  before onset of cellulitis. May be due to chronic venous insufficiency worsened by cellulitis vs DVT.  venous Doppler negative for DVT  Suspect chronic venous stasis resulting in hyperpigmentation mimicking cellulitis , -diuresed with IV Lasix Leg elevation, compression stockings recommended    HTN: Dose of Cozaar reduced due to renal insufficiency Resume Cozaar if renal panel stable   Acute on chronic Diastolic congestive heart failure: Last echo showing an EF 55% and grade 2 diastolic dysfunction. - I's and O's, daily weights Diuresed with iv lasix , creatinine slightly up, transition  to by mouth Lasix tomorrow Suspect patient primarily has venous insufficiency, recommend compression stockings   Addisons disease: - continue hydrocortisone.   BPH: - continue flomax  Gout: - continue allopurinol, colchicine  Depression: - continue lexapro  Hypothyroid: - continue synthroid  Chronic pain: - continue norco, Robaxin, Indocin  Hypogonadism:  - resume clomid at time of discharge    Discharge Exam:   Blood pressure 131/80, pulse 78, temperature 98.7 F (37.1 C), temperature source Oral, resp. rate 18, weight (!) 174.5 kg (384 lb 11.2 oz), SpO2 98 %.   General exam: Appears calm and comfortable  Respiratory system: Clear to auscultation. Respiratory effort normal. Cardiovascular system: S1 & S2 heard, RRR. No JVD, murmurs, rubs, gallops or clicks. No pedal edema. Gastrointestinal system: Abdomen is nondistended, soft and nontender. No organomegaly or masses felt. Normal bowel sounds heard. Central nervous system: Alert and oriented. No focal neurological deficits. Extremities: Symmetric 5 x 5 power. Trace edema Skin: No rashes, lesions or ulcers Psychiatry: Judgement and insight appear normal. Mood & affect appropriate.    Follow-up Information    Garvin Fila, MD. Schedule an appointment as soon as possible for a visit in 5 week(s).   Specialties:   Neurology, Radiology Contact information: [address]  [place] [address] [phone]           Signed: Reyne Dumas 11/17/2016, 9:39 AM        Time spent >45 mins

## 2016-11-18 LAB — VAS US CAROTID
LEFT ECA DIAS: -9 cm/s
LEFT VERTEBRAL DIAS: 17 cm/s
Left CCA dist dias: -19 cm/s
Left CCA dist sys: -89 cm/s
Left CCA prox dias: 26 cm/s
Left CCA prox sys: 145 cm/s
Left ICA dist dias: -24 cm/s
Left ICA dist sys: -64 cm/s
Left ICA prox dias: 18 cm/s
Left ICA prox sys: 72 cm/s
RIGHT ECA DIAS: -12 cm/s
RIGHT VERTEBRAL DIAS: 7 cm/s
Right CCA prox dias: 17 cm/s
Right CCA prox sys: 78 cm/s
Right cca dist sys: -97 cm/s

## 2016-11-18 NOTE — Care Management Note (Signed)
Case Management Note  Patient Details  Name: Dennis Payette Ridley Sr. MRN: [id number] Date of Birth: 1945/10/29  Subjective/Objective:                    Action/Plan: No f/u per PT/OT and no DME needs. Pt with insurance and PCP. No needs per CM.   Expected Discharge Date:  11/17/16               Expected Discharge Plan:  [place]  In-House Referral:     Discharge planning Services     Post Acute Care Choice:    Choice offered to:     DME Arranged:    DME Agency:     HH Arranged:    [place] Agency:     Status of Service:  Completed, signed off  If discussed at [employer], dates discussed:    Additional Comments:  Pollie Friar, RN 11/18/2016, 9:05 AM

## 2016-11-22 ENCOUNTER — Ambulatory Visit (INDEPENDENT_AMBULATORY_CARE_PROVIDER_SITE_OTHER): Payer: Medicare Other | Admitting: *Deleted

## 2016-11-22 DIAGNOSIS — I639 Cerebral infarction, unspecified: Secondary | ICD-10-CM | POA: Diagnosis not present

## 2016-11-23 NOTE — Progress Notes (Signed)
Carelink Summary Report / Loop Recorder 

## 2016-11-28 DIAGNOSIS — N390 Urinary tract infection, site not specified: Secondary | ICD-10-CM | POA: Diagnosis not present

## 2016-11-28 DIAGNOSIS — N401 Enlarged prostate with lower urinary tract symptoms: Secondary | ICD-10-CM | POA: Diagnosis not present

## 2016-11-28 DIAGNOSIS — R319 Hematuria, unspecified: Secondary | ICD-10-CM | POA: Diagnosis not present

## 2016-11-28 DIAGNOSIS — N138 Other obstructive and reflux uropathy: Secondary | ICD-10-CM | POA: Diagnosis not present

## 2016-11-28 DIAGNOSIS — N400 Enlarged prostate without lower urinary tract symptoms: Secondary | ICD-10-CM | POA: Diagnosis not present

## 2016-12-03 LAB — CUP PACEART REMOTE DEVICE CHECK
Date Time Interrogation Session: 20180516000558
Implantable Pulse Generator Implant Date: 20170719

## 2016-12-03 NOTE — Progress Notes (Signed)
Carelink summary report received. Battery status OK. Normal device function. No new symptom episodes, tachy episodes, brady, or pause episodes. No new AF episodes. Monthly summary reports and ROV/PRN 

## 2016-12-10 ENCOUNTER — Emergency Department
Admission: EM | Admit: 2016-12-10 | Discharge: 2016-12-10 | Disposition: A | Discharge status: Home / Self Care | Payer: Medicare Other | Source: Home / Self Care | Attending: Emergency Medicine | Admitting: Emergency Medicine

## 2016-12-10 ENCOUNTER — Encounter: Payer: Self-pay | Admitting: Intensive Care

## 2016-12-10 ENCOUNTER — Emergency Department: Payer: Medicare Other

## 2016-12-10 DIAGNOSIS — J189 Pneumonia, unspecified organism: Secondary | ICD-10-CM | POA: Diagnosis not present

## 2016-12-10 DIAGNOSIS — N289 Disorder of kidney and ureter, unspecified: Secondary | ICD-10-CM | POA: Diagnosis not present

## 2016-12-10 DIAGNOSIS — E039 Hypothyroidism, unspecified: Secondary | ICD-10-CM

## 2016-12-10 DIAGNOSIS — A419 Sepsis, unspecified organism: Secondary | ICD-10-CM | POA: Diagnosis not present

## 2016-12-10 DIAGNOSIS — Z7902 Long term (current) use of antithrombotics/antiplatelets: Secondary | ICD-10-CM

## 2016-12-10 DIAGNOSIS — I130 Hypertensive heart and chronic kidney disease with heart failure and stage 1 through stage 4 chronic kidney disease, or unspecified chronic kidney disease: Secondary | ICD-10-CM | POA: Diagnosis not present

## 2016-12-10 DIAGNOSIS — Z79899 Other long term (current) drug therapy: Secondary | ICD-10-CM

## 2016-12-10 DIAGNOSIS — I5030 Unspecified diastolic (congestive) heart failure: Secondary | ICD-10-CM

## 2016-12-10 DIAGNOSIS — I110 Hypertensive heart disease with heart failure: Secondary | ICD-10-CM

## 2016-12-10 DIAGNOSIS — E662 Morbid (severe) obesity with alveolar hypoventilation: Secondary | ICD-10-CM | POA: Diagnosis not present

## 2016-12-10 DIAGNOSIS — Z6841 Body Mass Index (BMI) 40.0 and over, adult: Secondary | ICD-10-CM | POA: Diagnosis not present

## 2016-12-10 DIAGNOSIS — E271 Primary adrenocortical insufficiency: Secondary | ICD-10-CM | POA: Diagnosis not present

## 2016-12-10 DIAGNOSIS — Z8673 Personal history of transient ischemic attack (TIA), and cerebral infarction without residual deficits: Secondary | ICD-10-CM | POA: Insufficient documentation

## 2016-12-10 DIAGNOSIS — Z7982 Long term (current) use of aspirin: Secondary | ICD-10-CM

## 2016-12-10 DIAGNOSIS — R0602 Shortness of breath: Secondary | ICD-10-CM

## 2016-12-10 DIAGNOSIS — Z8547 Personal history of malignant neoplasm of testis: Secondary | ICD-10-CM | POA: Insufficient documentation

## 2016-12-10 DIAGNOSIS — R0689 Other abnormalities of breathing: Secondary | ICD-10-CM | POA: Diagnosis not present

## 2016-12-10 DIAGNOSIS — R0603 Acute respiratory distress: Secondary | ICD-10-CM | POA: Diagnosis not present

## 2016-12-10 DIAGNOSIS — R600 Localized edema: Secondary | ICD-10-CM | POA: Diagnosis not present

## 2016-12-10 LAB — CBC
HCT: 42.7 % (ref 40.0–52.0)
Hemoglobin: 14.3 g/dL (ref 13.0–18.0)
MCH: 28.4 pg (ref 26.0–34.0)
MCHC: 33.4 g/dL (ref 32.0–36.0)
MCV: 84.8 fL (ref 80.0–100.0)
Platelets: 205 10*3/uL (ref 150–440)
RBC: 5.03 MIL/uL (ref 4.40–5.90)
RDW: 14 % (ref 11.5–14.5)
WBC: 7 10*3/uL (ref 3.8–10.6)

## 2016-12-10 LAB — COMPREHENSIVE METABOLIC PANEL
ALT: 17 U/L (ref 17–63)
AST: 20 U/L (ref 15–41)
Albumin: 4.4 g/dL (ref 3.5–5.0)
Alkaline Phosphatase: 56 U/L (ref 38–126)
Anion gap: 9 (ref 5–15)
BUN: 18 mg/dL (ref 6–20)
CO2: 29 mmol/L (ref 22–32)
Calcium: 9.7 mg/dL (ref 8.9–10.3)
Chloride: 98 mmol/L — ABNORMAL LOW (ref 101–111)
Creatinine, Ser: 1.26 mg/dL — ABNORMAL HIGH (ref 0.61–1.24)
GFR calc Af Amer: >60 mL/min (ref >60)
GFR calc non Af Amer: 56 mL/min — ABNORMAL LOW (ref >60)
Glucose, Bld: 120 mg/dL — ABNORMAL HIGH (ref 65–99)
Potassium: 3.7 mmol/L (ref 3.5–5.1)
Sodium: 136 mmol/L (ref 135–145)
Total Bilirubin: 1.2 mg/dL (ref 0.3–1.2)
Total Protein: 8.2 g/dL — ABNORMAL HIGH (ref 6.5–8.1)

## 2016-12-10 LAB — TROPONIN I: Troponin I: <0.03 ng/mL (ref <0.03)

## 2016-12-10 MED ORDER — IPRATROPIUM-ALBUTEROL 0.5-2.5 (3) MG/3ML IN SOLN
3.0000 mL | Freq: Once | RESPIRATORY_TRACT | Status: AC
  Administered 2016-12-10: 3 mL via RESPIRATORY_TRACT
  Filled 2016-12-10: qty 3

## 2016-12-10 NOTE — ED Triage Notes (Signed)
PAtient arrived by EMS from fast track for Shortness of breath X4 days. Productive cough present with black and yellow sputum. Patient also c/o feeling like his throat is constricted and did not take morning meds due to this feeling. Patient is on neutropenic precautions upon arrival. A&O x4

## 2016-12-10 NOTE — ED Notes (Signed)
Reviewed d/c instructions, follow-up care with patient. Pt verbalized understanding.  
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
## 2016-12-10 NOTE — ED Notes (Signed)
Pt resting in bed with son at bedside, VSS and WNL. Pt denies any needs at this time, states "I think I was just about to doze off". This RN apologized for delay. Will continue to monitor for further patient needs at this time.

## 2016-12-10 NOTE — ED Notes (Signed)
Dr. Kinner at bedside at this time.  

## 2016-12-11 ENCOUNTER — Emergency Department: Payer: Medicare Other

## 2016-12-11 ENCOUNTER — Encounter: Payer: Self-pay | Admitting: Emergency Medicine

## 2016-12-11 ENCOUNTER — Inpatient Hospital Stay
Admission: EM | Admit: 2016-12-11 | Discharge: 2016-12-14 | DRG: 871 | Disposition: A | Discharge status: Home Health | Payer: Medicare Other | Attending: Internal Medicine | Admitting: Internal Medicine

## 2016-12-11 DIAGNOSIS — Z96652 Presence of left artificial knee joint: Secondary | ICD-10-CM | POA: Diagnosis present

## 2016-12-11 DIAGNOSIS — Z8049 Family history of malignant neoplasm of other genital organs: Secondary | ICD-10-CM

## 2016-12-11 DIAGNOSIS — E785 Hyperlipidemia, unspecified: Secondary | ICD-10-CM | POA: Diagnosis present

## 2016-12-11 DIAGNOSIS — E2740 Unspecified adrenocortical insufficiency: Secondary | ICD-10-CM | POA: Diagnosis present

## 2016-12-11 DIAGNOSIS — A419 Sepsis, unspecified organism: Secondary | ICD-10-CM | POA: Diagnosis not present

## 2016-12-11 DIAGNOSIS — J189 Pneumonia, unspecified organism: Secondary | ICD-10-CM | POA: Diagnosis present

## 2016-12-11 DIAGNOSIS — D72829 Elevated white blood cell count, unspecified: Secondary | ICD-10-CM | POA: Diagnosis not present

## 2016-12-11 DIAGNOSIS — E786 Lipoprotein deficiency: Secondary | ICD-10-CM | POA: Diagnosis present

## 2016-12-11 DIAGNOSIS — M1A9XX1 Chronic gout, unspecified, with tophus (tophi): Secondary | ICD-10-CM | POA: Diagnosis present

## 2016-12-11 DIAGNOSIS — E271 Primary adrenocortical insufficiency: Secondary | ICD-10-CM | POA: Diagnosis present

## 2016-12-11 DIAGNOSIS — R0603 Acute respiratory distress: Secondary | ICD-10-CM | POA: Diagnosis not present

## 2016-12-11 DIAGNOSIS — Z6841 Body Mass Index (BMI) 40.0 and over, adult: Secondary | ICD-10-CM

## 2016-12-11 DIAGNOSIS — Z7982 Long term (current) use of aspirin: Secondary | ICD-10-CM

## 2016-12-11 DIAGNOSIS — N183 Chronic kidney disease, stage 3 (moderate): Secondary | ICD-10-CM | POA: Diagnosis present

## 2016-12-11 DIAGNOSIS — N189 Chronic kidney disease, unspecified: Secondary | ICD-10-CM

## 2016-12-11 DIAGNOSIS — Z8249 Family history of ischemic heart disease and other diseases of the circulatory system: Secondary | ICD-10-CM

## 2016-12-11 DIAGNOSIS — Z8673 Personal history of transient ischemic attack (TIA), and cerebral infarction without residual deficits: Secondary | ICD-10-CM

## 2016-12-11 DIAGNOSIS — I2510 Atherosclerotic heart disease of native coronary artery without angina pectoris: Secondary | ICD-10-CM | POA: Diagnosis present

## 2016-12-11 DIAGNOSIS — E662 Morbid (severe) obesity with alveolar hypoventilation: Secondary | ICD-10-CM | POA: Diagnosis present

## 2016-12-11 DIAGNOSIS — Z823 Family history of stroke: Secondary | ICD-10-CM

## 2016-12-11 DIAGNOSIS — I252 Old myocardial infarction: Secondary | ICD-10-CM

## 2016-12-11 DIAGNOSIS — R0602 Shortness of breath: Secondary | ICD-10-CM | POA: Diagnosis not present

## 2016-12-11 DIAGNOSIS — Z8619 Personal history of other infectious and parasitic diseases: Secondary | ICD-10-CM | POA: Diagnosis present

## 2016-12-11 DIAGNOSIS — J208 Acute bronchitis due to other specified organisms: Secondary | ICD-10-CM | POA: Diagnosis not present

## 2016-12-11 DIAGNOSIS — Z7902 Long term (current) use of antithrombotics/antiplatelets: Secondary | ICD-10-CM | POA: Diagnosis not present

## 2016-12-11 DIAGNOSIS — Z888 Allergy status to other drugs, medicaments and biological substances status: Secondary | ICD-10-CM

## 2016-12-11 DIAGNOSIS — R251 Tremor, unspecified: Secondary | ICD-10-CM | POA: Diagnosis present

## 2016-12-11 DIAGNOSIS — Z8547 Personal history of malignant neoplasm of testis: Secondary | ICD-10-CM | POA: Diagnosis not present

## 2016-12-11 DIAGNOSIS — N179 Acute kidney failure, unspecified: Secondary | ICD-10-CM | POA: Diagnosis not present

## 2016-12-11 DIAGNOSIS — Z91030 Bee allergy status: Secondary | ICD-10-CM

## 2016-12-11 DIAGNOSIS — I5032 Chronic diastolic (congestive) heart failure: Secondary | ICD-10-CM | POA: Diagnosis present

## 2016-12-11 DIAGNOSIS — R253 Fasciculation: Secondary | ICD-10-CM | POA: Diagnosis not present

## 2016-12-11 DIAGNOSIS — R0902 Hypoxemia: Secondary | ICD-10-CM | POA: Diagnosis present

## 2016-12-11 DIAGNOSIS — Z981 Arthrodesis status: Secondary | ICD-10-CM | POA: Diagnosis not present

## 2016-12-11 DIAGNOSIS — Z91013 Allergy to seafood: Secondary | ICD-10-CM

## 2016-12-11 DIAGNOSIS — N289 Disorder of kidney and ureter, unspecified: Secondary | ICD-10-CM | POA: Diagnosis not present

## 2016-12-11 DIAGNOSIS — Z833 Family history of diabetes mellitus: Secondary | ICD-10-CM | POA: Diagnosis not present

## 2016-12-11 DIAGNOSIS — E7800 Pure hypercholesterolemia, unspecified: Secondary | ICD-10-CM | POA: Diagnosis present

## 2016-12-11 DIAGNOSIS — Z91038 Other insect allergy status: Secondary | ICD-10-CM

## 2016-12-11 DIAGNOSIS — G4730 Sleep apnea, unspecified: Secondary | ICD-10-CM

## 2016-12-11 DIAGNOSIS — Z8701 Personal history of pneumonia (recurrent): Secondary | ICD-10-CM | POA: Diagnosis present

## 2016-12-11 DIAGNOSIS — R600 Localized edema: Secondary | ICD-10-CM | POA: Diagnosis not present

## 2016-12-11 DIAGNOSIS — T380X5A Adverse effect of glucocorticoids and synthetic analogues, initial encounter: Secondary | ICD-10-CM | POA: Diagnosis present

## 2016-12-11 DIAGNOSIS — I130 Hypertensive heart and chronic kidney disease with heart failure and stage 1 through stage 4 chronic kidney disease, or unspecified chronic kidney disease: Secondary | ICD-10-CM | POA: Diagnosis present

## 2016-12-11 DIAGNOSIS — I10 Essential (primary) hypertension: Secondary | ICD-10-CM | POA: Diagnosis not present

## 2016-12-11 LAB — BASIC METABOLIC PANEL
Anion gap: 11 (ref 5–15)
BUN: 32 mg/dL — ABNORMAL HIGH (ref 6–20)
CO2: 25 mmol/L (ref 22–32)
Calcium: 9.6 mg/dL (ref 8.9–10.3)
Chloride: 97 mmol/L — ABNORMAL LOW (ref 101–111)
Creatinine, Ser: 1.73 mg/dL — ABNORMAL HIGH (ref 0.61–1.24)
GFR calc Af Amer: 44 mL/min — ABNORMAL LOW (ref >60)
GFR calc non Af Amer: 38 mL/min — ABNORMAL LOW (ref >60)
Glucose, Bld: 181 mg/dL — ABNORMAL HIGH (ref 65–99)
Potassium: 4.3 mmol/L (ref 3.5–5.1)
Sodium: 133 mmol/L — ABNORMAL LOW (ref 135–145)

## 2016-12-11 LAB — CBC
HCT: 41.2 % (ref 40.0–52.0)
Hemoglobin: 13.9 g/dL (ref 13.0–18.0)
MCH: 28.2 pg (ref 26.0–34.0)
MCHC: 33.7 g/dL (ref 32.0–36.0)
MCV: 83.9 fL (ref 80.0–100.0)
Platelets: 266 10*3/uL (ref 150–440)
RBC: 4.91 MIL/uL (ref 4.40–5.90)
RDW: 14.3 % (ref 11.5–14.5)
WBC: 17.7 10*3/uL — ABNORMAL HIGH (ref 3.8–10.6)

## 2016-12-11 LAB — TROPONIN I: Troponin I: <0.03 ng/mL (ref <0.03)

## 2016-12-11 LAB — BRAIN NATRIURETIC PEPTIDE: B Natriuretic Peptide: 62 pg/mL (ref 0.0–100.0)

## 2016-12-11 LAB — LACTIC ACID, PLASMA: Lactic Acid, Venous: 2.2 mmol/L (ref 0.5–1.9)

## 2016-12-11 MED ORDER — OXYCODONE-ACETAMINOPHEN 5-325 MG PO TABS
1.0000 | ORAL_TABLET | Freq: Once | ORAL | Status: AC
Start: 2016-12-11 — End: 2016-12-11
  Administered 2016-12-11: 1 via ORAL

## 2016-12-11 MED ORDER — IPRATROPIUM-ALBUTEROL 0.5-2.5 (3) MG/3ML IN SOLN
3.0000 mL | Freq: Once | RESPIRATORY_TRACT | Status: AC
  Administered 2016-12-11: 3 mL via RESPIRATORY_TRACT

## 2016-12-11 MED ORDER — LEVOFLOXACIN IN D5W 750 MG/150ML IV SOLN
750.0000 mg | Freq: Once | INTRAVENOUS | Status: AC
  Administered 2016-12-11: 750 mg via INTRAVENOUS
  Filled 2016-12-11: qty 150

## 2016-12-11 MED ORDER — ALBUTEROL SULFATE (2.5 MG/3ML) 0.083% IN NEBU: 5.0000 mg | INHALATION_SOLUTION | Freq: Once | RESPIRATORY_TRACT | Status: DC

## 2016-12-11 MED ORDER — ALBUTEROL SULFATE (2.5 MG/3ML) 0.083% IN NEBU
10.0000 mg/h | INHALATION_SOLUTION | Freq: Once | RESPIRATORY_TRACT | Status: AC
  Administered 2016-12-11: 10 mg/h via RESPIRATORY_TRACT

## 2016-12-11 MED ORDER — ALBUTEROL SULFATE (2.5 MG/3ML) 0.083% IN NEBU
INHALATION_SOLUTION | RESPIRATORY_TRACT | Status: AC
  Filled 2016-12-11: qty 6

## 2016-12-11 MED ORDER — OXYCODONE-ACETAMINOPHEN 5-325 MG PO TABS
ORAL_TABLET | ORAL | Status: AC
  Filled 2016-12-11: qty 1

## 2016-12-11 MED ORDER — IPRATROPIUM-ALBUTEROL 0.5-2.5 (3) MG/3ML IN SOLN
RESPIRATORY_TRACT | Status: AC
  Filled 2016-12-11: qty 3
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
## 2016-12-11 NOTE — ED Triage Notes (Signed)
Pt taken to [facility] 4 from registration with noted wheezing. Pt with hx of asthma. Seen here yesterday and given steroids and SVNs. Went home. About 2 to 3 hours ago started having increasing shortness of breath and wheezing.
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
## 2016-12-11 NOTE — ED Notes (Signed)
Hospitalist at bedside 

## 2016-12-12 DIAGNOSIS — R251 Tremor, unspecified: Secondary | ICD-10-CM

## 2016-12-12 LAB — URINALYSIS, COMPLETE (UACMP) WITH MICROSCOPIC
Bilirubin Urine: NEGATIVE
Glucose, UA: NEGATIVE mg/dL
Hgb urine dipstick: NEGATIVE
Ketones, ur: NEGATIVE mg/dL
Leukocytes, UA: NEGATIVE
Nitrite: NEGATIVE
Protein, ur: NEGATIVE mg/dL
Specific Gravity, Urine: 1.018 (ref 1.005–1.030)
pH: 5 (ref 5.0–8.0)

## 2016-12-12 LAB — BASIC METABOLIC PANEL
Anion gap: 10 (ref 5–15)
BUN: 34 mg/dL — ABNORMAL HIGH (ref 6–20)
CO2: 25 mmol/L (ref 22–32)
Calcium: 9 mg/dL (ref 8.9–10.3)
Chloride: 99 mmol/L — ABNORMAL LOW (ref 101–111)
Creatinine, Ser: 1.59 mg/dL — ABNORMAL HIGH (ref 0.61–1.24)
GFR calc Af Amer: 49 mL/min — ABNORMAL LOW (ref >60)
GFR calc non Af Amer: 42 mL/min — ABNORMAL LOW (ref >60)
Glucose, Bld: 158 mg/dL — ABNORMAL HIGH (ref 65–99)
Potassium: 3.9 mmol/L (ref 3.5–5.1)
Sodium: 134 mmol/L — ABNORMAL LOW (ref 135–145)

## 2016-12-12 LAB — MRSA PCR SCREENING: MRSA by PCR: NEGATIVE

## 2016-12-12 LAB — PROCALCITONIN: Procalcitonin: <0.1 ng/mL

## 2016-12-12 LAB — CBC
HCT: 37.5 % — ABNORMAL LOW (ref 40.0–52.0)
Hemoglobin: 12.7 g/dL — ABNORMAL LOW (ref 13.0–18.0)
MCH: 28.7 pg (ref 26.0–34.0)
MCHC: 33.7 g/dL (ref 32.0–36.0)
MCV: 85.1 fL (ref 80.0–100.0)
Platelets: 213 10*3/uL (ref 150–440)
RBC: 4.41 MIL/uL (ref 4.40–5.90)
RDW: 14.3 % (ref 11.5–14.5)
WBC: 13 10*3/uL — ABNORMAL HIGH (ref 3.8–10.6)

## 2016-12-12 LAB — LACTIC ACID, PLASMA
Lactic Acid, Venous: 2.1 mmol/L (ref 0.5–1.9)
Lactic Acid, Venous: 2.5 mmol/L (ref 0.5–1.9)
Lactic Acid, Venous: 2.2 mmol/L (ref 0.5–1.9)

## 2016-12-12 MED ORDER — ACETAMINOPHEN 650 MG RE SUPP: 650.0000 mg | Freq: Four times a day (QID) | RECTAL | Status: DC | PRN

## 2016-12-12 MED ORDER — ESCITALOPRAM OXALATE 10 MG PO TABS
20.0000 mg | ORAL_TABLET | Freq: Every day | ORAL | Status: DC
  Administered 2016-12-12 – 2016-12-14 (×3): 20 mg via ORAL
  Filled 2016-12-12 (×3): qty 2

## 2016-12-12 MED ORDER — ASPIRIN 325 MG PO TABS
325.0000 mg | ORAL_TABLET | Freq: Every day | ORAL | Status: DC
  Administered 2016-12-12 – 2016-12-14 (×3): 325 mg via ORAL
  Filled 2016-12-12 (×3): qty 1

## 2016-12-12 MED ORDER — HYDROCORTISONE 10 MG PO TABS
20.0000 mg | ORAL_TABLET | Freq: Two times a day (BID) | ORAL | Status: DC
  Administered 2016-12-12 – 2016-12-13 (×4): 20 mg via ORAL
  Filled 2016-12-12: qty 1
  Filled 2016-12-12: qty 2
  Filled 2016-12-12: qty 1
  Filled 2016-12-12: qty 2
  Filled 2016-12-12: qty 1

## 2016-12-12 MED ORDER — ATORVASTATIN CALCIUM 20 MG PO TABS
80.0000 mg | ORAL_TABLET | Freq: Every day | ORAL | Status: DC
  Administered 2016-12-12 – 2016-12-14 (×3): 80 mg via ORAL
  Filled 2016-12-12 (×3): qty 4

## 2016-12-12 MED ORDER — DOXYCYCLINE HYCLATE 100 MG PO TABS
100.0000 mg | ORAL_TABLET | Freq: Two times a day (BID) | ORAL | Status: DC
  Administered 2016-12-12 – 2016-12-13 (×2): 100 mg via ORAL
  Filled 2016-12-12 (×2): qty 1

## 2016-12-12 MED ORDER — PIPERACILLIN-TAZOBACTAM 3.375 G IVPB
3.3750 g | Freq: Three times a day (TID) | INTRAVENOUS | Status: DC
  Administered 2016-12-12 (×2): 3.375 g via INTRAVENOUS
  Filled 2016-12-12 (×4): qty 50

## 2016-12-12 MED ORDER — VANCOMYCIN HCL 10 G IV SOLR
1500.0000 mg | Freq: Two times a day (BID) | INTRAVENOUS | Status: DC
  Administered 2016-12-12: 1500 mg via INTRAVENOUS
  Filled 2016-12-12 (×2): qty 1500
  Filled 2016-12-12: qty 500

## 2016-12-12 MED ORDER — GABAPENTIN 300 MG PO CAPS
300.0000 mg | ORAL_CAPSULE | Freq: Three times a day (TID) | ORAL | Status: DC
  Administered 2016-12-12: 300 mg via ORAL
  Filled 2016-12-12 (×2): qty 1

## 2016-12-12 MED ORDER — SPIRONOLACTONE 100 MG PO TABS
100.0000 mg | ORAL_TABLET | Freq: Every day | ORAL | Status: DC
  Administered 2016-12-12 – 2016-12-14 (×3): 100 mg via ORAL
  Filled 2016-12-12 (×3): qty 1

## 2016-12-12 MED ORDER — HEPARIN SODIUM (PORCINE) 5000 UNIT/ML IJ SOLN
5000.0000 [IU] | Freq: Three times a day (TID) | INTRAMUSCULAR | Status: DC
  Administered 2016-12-12 – 2016-12-14 (×8): 5000 [IU] via SUBCUTANEOUS
  Filled 2016-12-12 (×7): qty 1

## 2016-12-12 MED ORDER — METHYLPREDNISOLONE SODIUM SUCC 40 MG IJ SOLR
40.0000 mg | Freq: Two times a day (BID) | INTRAMUSCULAR | Status: DC
  Administered 2016-12-12 – 2016-12-13 (×2): 40 mg via INTRAVENOUS
  Filled 2016-12-12 (×2): qty 1

## 2016-12-12 MED ORDER — ONDANSETRON HCL 4 MG PO TABS: 4.0000 mg | ORAL_TABLET | Freq: Four times a day (QID) | ORAL | Status: DC | PRN

## 2016-12-12 MED ORDER — LEVOTHYROXINE SODIUM 100 MCG PO TABS
200.0000 ug | ORAL_TABLET | Freq: Every day | ORAL | Status: DC
  Administered 2016-12-12 – 2016-12-14 (×3): 200 ug via ORAL
  Filled 2016-12-12 (×3): qty 2

## 2016-12-12 MED ORDER — IPRATROPIUM-ALBUTEROL 0.5-2.5 (3) MG/3ML IN SOLN
3.0000 mL | RESPIRATORY_TRACT | Status: DC | PRN
Start: 2016-12-12 — End: 2016-12-14
  Administered 2016-12-12 – 2016-12-14 (×5): 3 mL via RESPIRATORY_TRACT
  Filled 2016-12-12 (×5): qty 3

## 2016-12-12 MED ORDER — COLCHICINE 0.6 MG PO TABS
0.6000 mg | ORAL_TABLET | Freq: Two times a day (BID) | ORAL | Status: DC
  Administered 2016-12-12 – 2016-12-14 (×6): 0.6 mg via ORAL
  Filled 2016-12-12 (×6): qty 1

## 2016-12-12 MED ORDER — SODIUM CHLORIDE 0.9 % IV SOLN
INTRAVENOUS | Status: AC
  Administered 2016-12-12 (×2): via INTRAVENOUS

## 2016-12-12 MED ORDER — ONDANSETRON HCL 4 MG/2ML IJ SOLN: 4.0000 mg | Freq: Four times a day (QID) | INTRAMUSCULAR | Status: DC | PRN

## 2016-12-12 MED ORDER — VANCOMYCIN HCL 10 G IV SOLR
1500.0000 mg | Freq: Once | INTRAVENOUS | Status: AC
  Administered 2016-12-12: 1500 mg via INTRAVENOUS
  Filled 2016-12-12: qty 1500

## 2016-12-12 MED ORDER — METHYLPREDNISOLONE SODIUM SUCC 125 MG IJ SOLR
60.0000 mg | Freq: Four times a day (QID) | INTRAMUSCULAR | Status: DC
  Administered 2016-12-12 (×2): 60 mg via INTRAVENOUS
  Filled 2016-12-12 (×3): qty 2

## 2016-12-12 MED ORDER — ACETAMINOPHEN 325 MG PO TABS: 650.0000 mg | ORAL_TABLET | Freq: Four times a day (QID) | ORAL | Status: DC | PRN

## 2016-12-12 MED ORDER — CLOPIDOGREL BISULFATE 75 MG PO TABS
75.0000 mg | ORAL_TABLET | Freq: Every day | ORAL | Status: DC
  Administered 2016-12-12 – 2016-12-14 (×3): 75 mg via ORAL
  Filled 2016-12-12 (×3): qty 1

## 2016-12-12 NOTE — Progress Notes (Signed)
Pt. Slept off and on throughout the night r/t med pass. No signs or c/o pain, SOB or acute distress observed. Pt. Stated he would have wife bring his home C-pap in today.

## 2016-12-12 NOTE — Progress Notes (Signed)
[place] [facility] [place] NAME: Dennis [name]    MR#:  [id number]  DATE OF BIRTH:  04/26/1946  SUBJECTIVE:  CHIEF COMPLAINT:  Patient is having tremors and jerks and productive cough. Wife reports that he has been having tremors for more than 25 years which are gradually getting worse  REVIEW OF SYSTEMS:  CONSTITUTIONAL: No fever, fatigue or weakness.  EYES: No blurred or double vision.  EARS, NOSE, AND THROAT: No tinnitus or ear pain.  RESPIRATORY: Reports productive cough, shortness of breath with minimal exertion, denies wheezing or hemoptysis.  CARDIOVASCULAR: No chest pain, orthopnea, edema.  GASTROINTESTINAL: No nausea, vomiting, diarrhea or abdominal pain.  GENITOURINARY: No dysuria, hematuria.  ENDOCRINE: No polyuria, nocturia,  HEMATOLOGY: No anemia, easy bruising or bleeding SKIN: No rash or lesion. MUSCULOSKELETAL: No joint pain or arthritis.   NEUROLOGIC: Reporting jerky movements No tingling, numbness, weakness.  PSYCHIATRY: No anxiety or depression.   DRUG ALLERGIES:   Allergies  Allergen Reactions  . Bee Venom Anaphylaxis  . Shrimp [Shellfish Allergy] Anaphylaxis and Other (See Comments)    "just shrimp"  . Stadol [Butorphanol] Anaphylaxis and Other (See Comments)    respiratory  Distress, couldn't breathe, cardiac arrest  . Wasp Venom Anaphylaxis    VITALS:  Blood pressure (!) 104/58, pulse 79, temperature 97.4 F (36.3 C), temperature source Oral, resp. rate 18, height 6\' 7"  (2.007 m), weight (!) 172.4 kg (380 lb 1.2 oz), SpO2 94 %.  PHYSICAL EXAMINATION:  GENERAL:  71 y.o.-year-old patient lying in the bed with no acute distress.  EYES: Pupils equal, round, reactive to light and accommodation. No scleral icterus. Extraocular muscles intact.  HEENT: Head atraumatic, normocephalic. Oropharynx and nasopharynx clear.  NECK:  Supple, no jugular venous distention. No thyroid enlargement, no tenderness.  LUNGS: Coarse  bronchial breath sounds bilaterally, no wheezing, rales,rhonchi or crepitation. No use of accessory muscles of respiration.  CARDIOVASCULAR: S1, S2 normal. No murmurs, rubs, or gallops.  ABDOMEN: Soft, nontender, nondistended. Bowel sounds present. No organomegaly or mass.  EXTREMITIES: Bilateral upper extremities with tremors, no jerks noticed No pedal edema, cyanosis, or clubbing.  NEUROLOGIC: Cranial nerves II through XII are intact. Muscle strength 5/5 in all extremities. Sensation intact. Gait not checked.  PSYCHIATRIC: The patient is alert and oriented x 3.  SKIN: No obvious rash, lesion, or ulcer.    LABORATORY PANEL:   CBC  Recent Labs Lab 12/12/16 0125  WBC 13.0*  HGB 12.7*  HCT 37.5*  PLT 213   ------------------------------------------------------------------------------------------------------------------  Chemistries   Recent Labs Lab 12/10/16 1300  12/12/16 0125  NA 136  < > 134*  K 3.7  < > 3.9  CL 98*  < > 99*  CO2 29  < > 25  GLUCOSE 120*  < > 158*  BUN 18  < > 34*  CREATININE 1.26*  < > 1.59*  CALCIUM 9.7  < > 9.0  AST 20  --   --   ALT 17  --   --   ALKPHOS 56  --   --   BILITOT 1.2  --   --   < > = values in this interval not displayed. ------------------------------------------------------------------------------------------------------------------  Cardiac Enzymes  Recent Labs Lab 12/11/16 2102  TROPONINI <0.03   ------------------------------------------------------------------------------------------------------------------  RADIOLOGY:  Dg Chest 1 View  Result Date: 12/11/2016 CLINICAL DATA:  Patient with shortness of breath. EXAM: CHEST 1 VIEW COMPARISON:  Chest radiograph 12/10/2016 FINDINGS: Monitoring leads overlie the patient.  Cardiomegaly. No consolidative pulmonary opacities. Basilar atelectasis. No pleural effusion or pneumothorax. IMPRESSION: No acute cardiopulmonary process. Cardiomegaly. Basilar atelectasis. Electronically  Signed   By: Lovey Newcomer M.D.   On: 12/11/2016 21:19   US Venous Img Lower Bilateral  Result Date: 12/11/2016 CLINICAL DATA:  Bilateral lower extremity edema EXAM: BILATERAL LOWER EXTREMITY VENOUS DOPPLER ULTRASOUND TECHNIQUE: Gray-scale sonography with graded compression, as well as color Doppler and duplex ultrasound were performed to evaluate the lower extremity deep venous systems from the level of the common femoral vein and including the common femoral, femoral, profunda femoral, popliteal and calf veins including the posterior tibial, peroneal and gastrocnemius veins when visible. The superficial great saphenous vein was also interrogated. Spectral Doppler was utilized to evaluate flow at rest and with distal augmentation maneuvers in the common femoral, femoral and popliteal veins. COMPARISON:  None. FINDINGS: RIGHT LOWER EXTREMITY Common Femoral Vein: No evidence of thrombus. Normal compressibility, respiratory phasicity and response to augmentation. Saphenofemoral Junction: No evidence of thrombus. Normal compressibility and flow on color Doppler imaging. Profunda Femoral Vein: No evidence of thrombus. Normal compressibility and flow on color Doppler imaging. Femoral Vein: No evidence of thrombus. Normal compressibility, respiratory phasicity and response to augmentation. Popliteal Vein: No evidence of thrombus. Normal compressibility, respiratory phasicity and response to augmentation. Calf Veins: No evidence of thrombus. Normal compressibility and flow on color Doppler imaging. Superficial Great Saphenous Vein: No evidence of thrombus. Normal compressibility and flow on color Doppler imaging. Venous Reflux:  None. Other Findings:  None. LEFT LOWER EXTREMITY Common Femoral Vein: No evidence of thrombus. Normal compressibility, respiratory phasicity and response to augmentation. Saphenofemoral Junction: No evidence of thrombus. Normal compressibility and flow on color Doppler imaging. Profunda Femoral  Vein: No evidence of thrombus. Normal compressibility and flow on color Doppler imaging. Femoral Vein: No evidence of thrombus. Normal compressibility, respiratory phasicity and response to augmentation. Popliteal Vein: No evidence of thrombus. Normal compressibility, respiratory phasicity and response to augmentation. Calf Veins: No evidence of thrombus. Normal compressibility and flow on color Doppler imaging. Superficial Great Saphenous Vein: No evidence of thrombus. Normal compressibility and flow on color Doppler imaging. Venous Reflux:  None. Other Findings:  None. IMPRESSION: No evidence of DVT within either lower extremity. Electronically Signed   By: Inez Catalina M.D.   On: 12/11/2016 22:47    EKG:   Orders placed or performed during the hospital encounter of 12/11/16  . EKG 12-Lead  . EKG 12-Lead  . ED EKG  . ED EKG  . ED EKG 12-Lead  . ED EKG 12-Lead    ASSESSMENT AND PLAN:   # Sepsis ([place]) - unclear etiology  Blood cultures with no growth so far Check urine culture and sensitivity and sputum culture  imaging does not show clear pneumonia, however given the patient's elevated white blood cell count, respiratory symptoms, and elevated lactate we will treat him for clinical  Pneumonia.  IV antibiotics with Zosyn and vancomycin, ID change them to doxycycline and recommending to discontinue antibiotics if cultures are negative, appreciate ID recommendations we will trend his lactate until within normal limits with administration of IV fluids   #CAP (community acquired pneumonia) - IV antibiotics changed to doxycycline and follow-up cultures   supportive treatment when necessary with nebs and antitussive   IV Solu-Medrol number   #  Acute on chronic kidney failure ([place])  - IV fluids as above for hydration, avoid nephrotoxins and monitor for expected improvement  #Acute jerky movements on chronic tremors Neurology  consult placed    Chronic diastolic CHF (congestive heart failure)  ([place]) - continue home meds, cautious administration of IV fluids as above    Essential hypertension - stable, continue home meds    Adrenal insufficiency ([place]) - double home dose hydrocortisone for acute illness     All the records are reviewed and case discussed with Care Management/Social Workerr. Management plans discussed with the patient, family and they are in agreement.  CODE STATUS: FC   TOTAL TIME TAKING CARE OF THIS PATIENT: 36  minutes.   POSSIBLE D/C IN 2  DAYS, DEPENDING ON CLINICAL CONDITION.  Note: This dictation was prepared with Dragon dictation along with smaller phrase technology. Any transcriptional errors that result from this process are unintentional.   Nicholes Mango M.D on 12/12/2016 at 3:29 PM  Between 7am to 6pm - Pager - [phone] After 6pm go to [email or web address] - password [email or web address] [facility]  [facility]  Office  [phone]  CC: Primary care physician; Jerrol Banana., MD

## 2016-12-12 NOTE — Progress Notes (Signed)
Pharmacy Antibiotic Note  Dennis Eckels Adamski Sr. is a 71 y.o. male admitted on 12/11/2016 with sepsis.  Pharmacy has been consulted for vancomycin and Zosyn dosing.  Plan: DW 125kg  Vd 88L kei 0.062 hr-1  T1/2 11 hours Vancomycin 1500 mg q 12 hours ordered with stacked dosing. Level before 5th dose. Goal trough 15-20.  Zosyn 3.375 grams q 8 hours ordered.  Height: 6\' 7"  (200.7 cm) Weight: (!) 380 lb 1.2 oz (172.4 kg) IBW/kg (Calculated) : 93.7  Temp (24hrs), Avg:98 F (36.7 C), Min:97.6 F (36.4 C), Max:98.3 F (36.8 C)   Recent Labs Lab 12/10/16 1300 12/11/16 2102 12/11/16 2139  WBC 7.0 17.7*  --   CREATININE 1.26* 1.73*  --   LATICACIDVEN  --   --  2.2*    Estimated Creatinine Clearance: 69.4 mL/min (A) (by C-G formula based on SCr of 1.73 mg/dL (H)).    Allergies  Allergen Reactions  . Bee Venom Anaphylaxis  . Shrimp [Shellfish Allergy] Anaphylaxis and Other (See Comments)    "just shrimp"  . Stadol [Butorphanol] Anaphylaxis and Other (See Comments)    respiratory  Distress, couldn't breathe, cardiac arrest  . Wasp Venom Anaphylaxis    Antimicrobials this admission: vancomycin Zosyn 6/4 >>    >>   Dose adjustments this admission:   Microbiology results: 6/3 BCx: pending  2/21 MRSA PCR: (-)      6/3 UA: pending 6/3 CXR no acute disease Thank you for allowing pharmacy to be a part of this patient'[name] care.  Dennis [name] 12/12/2016 1:11 AM
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
## 2016-12-12 NOTE — Progress Notes (Signed)
Patient resting in the bed at this time, remains alert and oriented, rounded with MD at bedside, patient  and family updated about POC, will continue to monitor
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
## 2016-12-12 NOTE — Care Management Note (Signed)
Case Management Note  Patient Details  Name: Dennis Fouche Mukai Sr. MRN: [id number] Date of Birth: [birth year]  Subjective/Objective:                 Patient presented from home with shortness of breath.  Admitted with sepsis maybe due to a pneumonia.  Patient is currently in the process of getting accepted by the [place] in [place] under agent orange criteria.  [place]  veteran now with history of testicular and prostate cancer, addison's disease, several strokes, multiple spinal fusions and rods. has chronic home cpap.  At baseline, patient is able to walk without assistive device but is getting more sedentary.  It is is recommended patient needs home health or skilled nursing, Patient does not have a home nebulizer machine and oxygen is acute.   Action/Plan:  Discussed need for physical therapy and occupational therapy consult with attending.  Will need to be assessed for home 02.     Expected Discharge Date:                  Expected Discharge Plan:     In-House Referral:     Discharge planning Services     Post Acute Care Choice:    Choice offered to:     DME Arranged:    DME Agency:     HH Arranged:    HH Agency:     Status of Service:     If discussed at [employer], dates discussed:    Additional Comments:  Katrina Stack, RN 12/12/2016, 2:54 PM

## 2016-12-12 NOTE — Progress Notes (Signed)
Dr. Marcille Blanco notified of lactic acid of 2.1, no new orders.

## 2016-12-13 LAB — BASIC METABOLIC PANEL
Anion gap: 7 (ref 5–15)
BUN: 31 mg/dL — ABNORMAL HIGH (ref 6–20)
CO2: 23 mmol/L (ref 22–32)
Calcium: 8.9 mg/dL (ref 8.9–10.3)
Chloride: 103 mmol/L (ref 101–111)
Creatinine, Ser: 1.21 mg/dL (ref 0.61–1.24)
GFR calc Af Amer: >60 mL/min (ref >60)
GFR calc non Af Amer: 58 mL/min — ABNORMAL LOW (ref >60)
Glucose, Bld: 198 mg/dL — ABNORMAL HIGH (ref 65–99)
Potassium: 4.6 mmol/L (ref 3.5–5.1)
Sodium: 133 mmol/L — ABNORMAL LOW (ref 135–145)

## 2016-12-13 LAB — CBC WITH DIFFERENTIAL/PLATELET
Basophils Absolute: 0 10*3/uL (ref 0–0.1)
Basophils Relative: 0 %
Eosinophils Absolute: 0 10*3/uL (ref 0–0.7)
Eosinophils Relative: 0 %
HCT: 38.8 % — ABNORMAL LOW (ref 40.0–52.0)
Hemoglobin: 12.9 g/dL — ABNORMAL LOW (ref 13.0–18.0)
Lymphocytes Relative: 4 %
Lymphs Abs: 0.5 10*3/uL — ABNORMAL LOW (ref 1.0–3.6)
MCH: 28.4 pg (ref 26.0–34.0)
MCHC: 33.1 g/dL (ref 32.0–36.0)
MCV: 85.7 fL (ref 80.0–100.0)
Monocytes Absolute: 0.3 10*3/uL (ref 0.2–1.0)
Monocytes Relative: 2 %
Neutro Abs: 13.7 10*3/uL — ABNORMAL HIGH (ref 1.4–6.5)
Neutrophils Relative %: 94 %
Platelets: 210 10*3/uL (ref 150–440)
RBC: 4.53 MIL/uL (ref 4.40–5.90)
RDW: 14.1 % (ref 11.5–14.5)
WBC: 14.6 10*3/uL — ABNORMAL HIGH (ref 3.8–10.6)

## 2016-12-13 LAB — LACTIC ACID, PLASMA: Lactic Acid, Venous: 1.7 mmol/L (ref 0.5–1.9)

## 2016-12-13 LAB — TSH: TSH: 1.499 u[IU]/mL (ref 0.350–4.500)

## 2016-12-13 MED ORDER — BENZONATATE 100 MG PO CAPS
200.0000 mg | ORAL_CAPSULE | Freq: Three times a day (TID) | ORAL | Status: DC | PRN
  Administered 2016-12-14 (×2): 200 mg via ORAL
  Filled 2016-12-13 (×2): qty 2

## 2016-12-13 MED ORDER — HYDROCORTISONE 10 MG PO TABS
10.0000 mg | ORAL_TABLET | Freq: Two times a day (BID) | ORAL | Status: DC
  Administered 2016-12-13 – 2016-12-14 (×2): 10 mg via ORAL
  Filled 2016-12-13 (×3): qty 1

## 2016-12-13 MED ORDER — METHYLPREDNISOLONE SODIUM SUCC 40 MG IJ SOLR
40.0000 mg | INTRAMUSCULAR | Status: DC
  Administered 2016-12-14: 40 mg via INTRAVENOUS
  Filled 2016-12-13: qty 1

## 2016-12-13 MED ORDER — GUAIFENESIN-DM 100-10 MG/5ML PO SYRP
5.0000 mL | ORAL_SOLUTION | ORAL | Status: DC | PRN
  Administered 2016-12-13 – 2016-12-14 (×3): 5 mL via ORAL
  Filled 2016-12-13 (×3): qty 5

## 2016-12-13 NOTE — Progress Notes (Signed)
Subjective: Patient reports improvement today.  Able to use his computer.  Feels his tremor is better.  Has been discontinued from the Neurontin.  Renal function continues to improve.    Objective: Current vital signs: BP (!) 142/79 (BP Location: Left Arm)   Pulse 78   Temp 97.7 F (36.5 C) (Oral)   Resp 14   Ht 6\' 7"  (2.007 m)   Wt (!) 172.4 kg (380 lb 1.2 oz)   SpO2 95%   BMI 42.82 kg/m  Vital signs in last 24 hours: Temp:  [97.5 F (36.4 C)-97.8 F (36.6 C)] 97.7 F (36.5 C) (06/05 1155) Pulse Rate:  [78-83] 78 (06/05 1155) Resp:  [14-18] 14 (06/05 1155) BP: (115-143)/(66-79) 142/79 (06/05 1155) SpO2:  [95 %-98 %] 95 % (06/05 1155)  Intake/Output from previous day: 06/04 0701 - 06/05 0700 In: 1100 [P.O.:1100] Out: 2250 [Urine:2250] Intake/Output this shift: Total I/O In: 240 [P.O.:240] Out: 350 [Urine:350] Nutritional status: Diet Heart Room service appropriate? Yes; Fluid consistency: Thin  Neurologic Exam: Mental Status: Alert, oriented, thought content appropriate.  Speech fluent without evidence of aphasia.  Able to follow 3 step commands without difficulty. Cranial Nerves: II: Discs flat bilaterally; Visual fields grossly normal, pupils equal, round, reactive to light and accommodation III,IV, VI: ptosis not present, extra-ocular motions intact bilaterally V,VII: smile symmetric, facial light touch sensation normal bilaterally VIII: hearing normal bilaterally IX,X: gag reflex present XI: bilateral shoulder shrug XII: midline tongue extension Motor: 5/5 in the BUE's.  Tremor with less amplitude and intermittent in the LUE.  Continued low amplitude tremor in the RUE Sensory: Pinprick and light touch intact throughout, bilaterally   Lab Results: Basic Metabolic Panel:  Recent Labs Lab 12/10/16 1300 12/11/16 2102 12/12/16 0125 12/13/16 0547  NA 136 133* 134* 133*  K 3.7 4.3 3.9 4.6  CL 98* 97* 99* 103  CO2 29 25 25 23   GLUCOSE 120* 181* 158* 198*   BUN 18 32* 34* 31*  CREATININE 1.26* 1.73* 1.59* 1.21  CALCIUM 9.7 9.6 9.0 8.9    Liver Function Tests:  Recent Labs Lab 12/10/16 1300  AST 20  ALT 17  ALKPHOS 56  BILITOT 1.2  PROT 8.2*  ALBUMIN 4.4   No results for input(s): LIPASE, AMYLASE in the last 168 hours. No results for input(s): AMMONIA in the last 168 hours.  CBC:  Recent Labs Lab 12/10/16 1300 12/11/16 2102 12/12/16 0125 12/13/16 0547  WBC 7.0 17.7* 13.0* 14.6*  NEUTROABS  --   --   --  13.7*  HGB 14.3 13.9 12.7* 12.9*  HCT 42.7 41.2 37.5* 38.8*  MCV 84.8 83.9 85.1 85.7  PLT 205 266 213 210    Cardiac Enzymes:  Recent Labs Lab 12/10/16 1300 12/11/16 2102  TROPONINI <0.03 <0.03    Lipid Panel: No results for input(s): CHOL, TRIG, HDL, CHOLHDL, VLDL, LDLCALC in the last 168 hours.  CBG: No results for input(s): GLUCAP in the last 168 hours.  Microbiology: Results for orders placed or performed during the hospital encounter of 12/11/16  Blood culture (routine x 2)     Status: None (Preliminary result)   Collection Time: 12/11/16  9:39 PM  Result Value Ref Range Status   Specimen Description BLOOD BLOOD LEFT FOREARM  Final   Special Requests   Final    BOTTLES DRAWN AEROBIC AND ANAEROBIC Blood Culture adequate volume   Culture NO GROWTH 2 DAYS  Final   Report Status PENDING  Incomplete  Blood culture (routine x  2)     Status: None (Preliminary result)   Collection Time: 12/11/16  9:39 PM  Result Value Ref Range Status   Specimen Description BLOOD LEFT ANTECUBITAL  Final   Special Requests   Final    BOTTLES DRAWN AEROBIC AND ANAEROBIC Blood Culture results may not be optimal due to an excessive volume of blood received in culture bottles   Culture NO GROWTH 2 DAYS  Final   Report Status PENDING  Incomplete  MRSA PCR Screening     Status: None   Collection Time: 12/12/16  4:55 PM  Result Value Ref Range Status   MRSA by PCR NEGATIVE NEGATIVE Final    Comment:        The GeneXpert  MRSA Assay (FDA approved for NASAL specimens only), is one component of a comprehensive MRSA colonization surveillance program. It is not intended to diagnose MRSA infection nor to guide or monitor treatment for MRSA infections.     Coagulation Studies: No results for input(s): LABPROT, INR in the last 72 hours.  Imaging: Dg Chest 1 View  Result Date: 12/11/2016 CLINICAL DATA:  Patient with shortness of breath. EXAM: CHEST 1 VIEW COMPARISON:  Chest radiograph 12/10/2016 FINDINGS: Monitoring leads overlie the patient. Cardiomegaly. No consolidative pulmonary opacities. Basilar atelectasis. No pleural effusion or pneumothorax. IMPRESSION: No acute cardiopulmonary process. Cardiomegaly. Basilar atelectasis. Electronically Signed   By: Lovey Newcomer M.D.   On: 12/11/2016 21:19   US Venous Img Lower Bilateral  Result Date: 12/11/2016 CLINICAL DATA:  Bilateral lower extremity edema EXAM: BILATERAL LOWER EXTREMITY VENOUS DOPPLER ULTRASOUND TECHNIQUE: Gray-scale sonography with graded compression, as well as color Doppler and duplex ultrasound were performed to evaluate the lower extremity deep venous systems from the level of the common femoral vein and including the common femoral, femoral, profunda femoral, popliteal and calf veins including the posterior tibial, peroneal and gastrocnemius veins when visible. The superficial great saphenous vein was also interrogated. Spectral Doppler was utilized to evaluate flow at rest and with distal augmentation maneuvers in the common femoral, femoral and popliteal veins. COMPARISON:  None. FINDINGS: RIGHT LOWER EXTREMITY Common Femoral Vein: No evidence of thrombus. Normal compressibility, respiratory phasicity and response to augmentation. Saphenofemoral Junction: No evidence of thrombus. Normal compressibility and flow on color Doppler imaging. Profunda Femoral Vein: No evidence of thrombus. Normal compressibility and flow on color Doppler imaging. Femoral  Vein: No evidence of thrombus. Normal compressibility, respiratory phasicity and response to augmentation. Popliteal Vein: No evidence of thrombus. Normal compressibility, respiratory phasicity and response to augmentation. Calf Veins: No evidence of thrombus. Normal compressibility and flow on color Doppler imaging. Superficial Great Saphenous Vein: No evidence of thrombus. Normal compressibility and flow on color Doppler imaging. Venous Reflux:  None. Other Findings:  None. LEFT LOWER EXTREMITY Common Femoral Vein: No evidence of thrombus. Normal compressibility, respiratory phasicity and response to augmentation. Saphenofemoral Junction: No evidence of thrombus. Normal compressibility and flow on color Doppler imaging. Profunda Femoral Vein: No evidence of thrombus. Normal compressibility and flow on color Doppler imaging. Femoral Vein: No evidence of thrombus. Normal compressibility, respiratory phasicity and response to augmentation. Popliteal Vein: No evidence of thrombus. Normal compressibility, respiratory phasicity and response to augmentation. Calf Veins: No evidence of thrombus. Normal compressibility and flow on color Doppler imaging. Superficial Great Saphenous Vein: No evidence of thrombus. Normal compressibility and flow on color Doppler imaging. Venous Reflux:  None. Other Findings:  None. IMPRESSION: No evidence of DVT within either lower extremity. Electronically Signed   By:  Inez Catalina M.D.   On: 12/11/2016 22:47    Medications:  I have reviewed the patient's current medications. Scheduled: . aspirin  325 mg Oral Daily  . atorvastatin  80 mg Oral Daily  . clopidogrel  75 mg Oral Daily  . colchicine  0.6 mg Oral BID  . escitalopram  20 mg Oral Daily  . heparin  5,000 Units Subcutaneous Q8H  . hydrocortisone  20 mg Oral BID  . levothyroxine  200 mcg Oral QAC breakfast  . [START ON 12/14/2016] methylPREDNISolone (SOLU-MEDROL) injection  40 mg Intravenous Q24H  . spironolactone  100 mg  Oral Daily    Assessment/Plan: Tremor improved.  Off Neurontin.  Renal function improving.  TSH normal.  Agree with current management.  No further neurologic intervention is recommended at this time.  If further questions arise, please call or page at that time.  Thank you for allowing neurology to participate in the care of this patient.    LOS: 2 days   Alexis Goodell, MD Neurology [phone] 12/13/2016  2:10 PM

## 2016-12-13 NOTE — Evaluation (Signed)
Physical Therapy Evaluation Patient Details Name: Dennis Fenlon Forge Sr. MRN: [id number] DOB: 1946/04/05 Today's Date: 12/13/2016   History of Present Illness  71 y.o. male who presents with Several days of progressive shortness of breath, with some wheezing. Recently in ER, was given some breathing treatments and felt better and was discharged. He come back with short of breath, wheezing. Pt generally has been feeling lower energy for months now. Pt with extensive history with low back surgeries, Addison's Dz, CVA ~1 year ago and complications associated with Agent Orange exposure.  Clinical Impression  Pt on 2 liters O2 on arrival sats in the high 90s, on room air sats stay in high 90s with bed mobility, light exercise, during ambulation on room air his sats dropped minimally (mid 90s) but HR did increase to 130s.  Overall pt did well with ambulation with community appropriate speed and overall good confidence/safety.  He has generally been weaker than his baseline and has had to limit community activities, regular monitored exercises program is likely the best option for him moving forward.     Follow Up Recommendations Home health PT (or possible [facility]-type exercise program)    Equipment Recommendations       Recommendations for Other Services       Precautions / Restrictions Restrictions Weight Bearing Restrictions: No      Mobility  Bed Mobility Overal bed mobility: Independent                Transfers Overall transfer level: Independent Equipment used: None             General transfer comment: Pt able to rise with good confidence, no safety issues.  Ambulation/Gait Ambulation/Gait assistance: Modified independent (Device/Increase time) Ambulation Distance (Feet): 250 Feet Assistive device: None       General Gait Details: Pt did well with ambulation, showed good confidence and safety.  Pt on room air t/o the effort and sats remained in the  90s, his HR did increase to 130s and he showed some fatigue.   Stairs Stairs: Yes Stairs assistance: Modified independent (Device/Increase time) Stair Management: Two rails;One rail Right Number of Stairs: 6 General stair comments: Pt able to negotiate steps with 2 and single rail, no safety issues.  Wheelchair Mobility    Modified Rankin (Stroke Patients Only)       Balance Overall balance assessment: Modified Independent                                           Pertinent Vitals/Pain Pain Assessment:  (reports nothing new from normal baseline of constant pain)    Home Living Family/patient expects to be discharged to:: Private residence Living Arrangements: Spouse/significant other Available Help at Discharge: Family;Available PRN/intermittently Type of Home: House Home Access: Stairs to enter Entrance Stairs-Rails: Can reach both;Left;Right Entrance Stairs-Number of Steps: 5 Home Layout: Two level;Able to live on main level with bedroom/bathroom Home Equipment: Shower seat;Walker - 2 wheels;Bedside commode;Hand held shower head;Crutches;Cane - single point      Prior Function Level of Independence: Independent         Comments: works, able to be relatively active, has been generally weak for 6+ months     Hand Dominance        Extremity/Trunk Assessment   Upper Extremity Assessment Upper Extremity Assessment: Overall WFL for tasks assessed    Lower Extremity Assessment  Lower Extremity Assessment: Overall WFL for tasks assessed       Communication   Communication: No difficulties  Cognition Arousal/Alertness: Awake/alert Behavior During Therapy: WFL for tasks assessed/performed Overall Cognitive Status: Within Functional Limits for tasks assessed                                        General Comments      Exercises     Assessment/Plan    PT Assessment Patient needs continued PT services  PT Problem List  Decreased activity tolerance;Decreased mobility;Decreased safety awareness;Cardiopulmonary status limiting activity       PT Treatment Interventions Gait training;Stair training;Functional mobility training;Therapeutic activities;Therapeutic exercise;Balance training;Neuromuscular re-education;Patient/family education    PT Goals (Current goals can be found in the Care Plan section)  Acute Rehab PT Goals Patient Stated Goal: go home PT Goal Formulation: With patient Time For Goal Achievement: 12/27/16 Potential to Achieve Goals: Good    Frequency Min 2X/week   Barriers to discharge        Co-evaluation               AM-PAC PT "6 Clicks" Daily Activity  Outcome Measure Difficulty turning over in bed (including adjusting bedclothes, sheets and blankets)?: None Difficulty moving from lying on back to sitting on the side of the bed? : None Difficulty sitting down on and standing up from a chair with arms (e.g., wheelchair, bedside commode, etc,.)?: None Help needed moving to and from a bed to chair (including a wheelchair)?: None Help needed walking in hospital room?: None Help needed climbing 3-5 steps with a railing? : None 6 Click Score: 24    End of Session Equipment Utilized During Treatment: Gait belt Activity Tolerance: Patient tolerated treatment well;Patient limited by fatigue Patient left: with chair alarm set;with call bell/phone within reach   PT Visit Diagnosis: Difficulty in walking, not elsewhere classified (R26.2)    Time: 1017-5102 PT Time Calculation (min) (ACUTE ONLY): 28 min   Charges:   PT Evaluation $PT Eval Low Complexity: 1 Procedure     PT G CodesKreg Shropshire, DPT 12/13/2016, 1:59 PM

## 2016-12-13 NOTE — Progress Notes (Signed)
Notified Dr. Jannifer Franklin of patient's continued dry cough. Robitussin and tessalon perles ordered PRN. Robitussin given to patient and PRN breathing treatment given. Patient states he feels better. Will continue to monitor.

## 2016-12-13 NOTE — Progress Notes (Addendum)
[facility] INFECTIOUS DISEASE PROGRESS NOTE Date of Admission:  12/11/2016     ID: Dennis Nations Loma Sr. is a 71 y.o. male with leukocytosis Principal Problem:   Sepsis ([place]) Active Problems:   OSA on CPAP   Chronic diastolic CHF (congestive heart failure) (HCC)   Essential hypertension   Adrenal insufficiency (HCC)   CAP (community acquired pneumonia)   Acute on chronic kidney failure (HCC)   Subjective: No fevers, on doxy. Sitting at side of bed, off O2, feels a lot better.   ROS  Eleven systems are reviewed and negative except per hpi  Medications:  Antibiotics Given (last 72 hours)    Date/Time Action Medication Dose Rate   12/11/16 2201 New Bag/Given   levofloxacin (LEVAQUIN) IVPB 750 mg 750 mg 100 mL/hr   12/12/16 0201 New Bag/Given   vancomycin (VANCOCIN) 1,500 mg in sodium chloride 0.9 % 500 mL IVPB 1,500 mg 250 mL/hr   12/12/16 0224 New Bag/Given   piperacillin-tazobactam (ZOSYN) IVPB 3.375 g 3.375 g 12.5 mL/hr   12/12/16 0919 New Bag/Given   piperacillin-tazobactam (ZOSYN) IVPB 3.375 g 3.375 g 12.5 mL/hr   12/12/16 1112 New Bag/Given   vancomycin (VANCOCIN) 1,500 mg in sodium chloride 0.9 % 500 mL IVPB 1,500 mg 250 mL/hr   12/12/16 1610 Given   doxycycline (VIBRA-TABS) tablet 100 mg 100 mg    12/13/16 0917 Given   doxycycline (VIBRA-TABS) tablet 100 mg 100 mg      . aspirin  325 mg Oral Daily  . atorvastatin  80 mg Oral Daily  . clopidogrel  75 mg Oral Daily  . colchicine  0.6 mg Oral BID  . escitalopram  20 mg Oral Daily  . heparin  5,000 Units Subcutaneous Q8H  . hydrocortisone  20 mg Oral BID  . levothyroxine  200 mcg Oral QAC breakfast  . [START ON 12/14/2016] methylPREDNISolone (SOLU-MEDROL) injection  40 mg Intravenous Q24H  . spironolactone  100 mg Oral Daily    Objective: Vital signs in last 24 hours: Temp:  [97.5 F (36.4 C)-97.8 F (36.6 C)] 97.7 F (36.5 C) (06/05 1155) Pulse Rate:  [78-83] 78 (06/05 1155) Resp:  [14-18] 14 (06/05  1155) BP: (115-143)/(66-79) 142/79 (06/05 1155) SpO2:  [95 %-98 %] 95 % (06/05 1155) Constitutional:  oriented to person, place, and time. Morbidly obese 380 #s HENT: [place]/AT, PERRLA, no scleral icterus Mouth/Throat: Oropharynx is clear and moist. No oropharyngeal exudate.  Cardiovascular: distant   Pulmonary/Chest: poor air movement Neck = supple, no nuchal rigidity Abdominal: Soft, large pannus. Bowel sounds are normal.  exhibits no distension. There is no tenderness.  Lymphadenopathy: no cervical adenopathy. No axillary adenopathy Neurological: alert and oriented to person, place, and time.  Ext 1+ bil edema Skin: Skin is warm and dry. LLE with mild brawny discoloration  Psychiatric: a normal mood and affect.  behavior is normal.    Lab Results  Recent Labs  12/12/16 0125 12/13/16 0547  WBC 13.0* 14.6*  HGB 12.7* 12.9*  HCT 37.5* 38.8*  NA 134* 133*  K 3.9 4.6  CL 99* 103  CO2 25 23  BUN 34* 31*  CREATININE 1.59* 1.21    Microbiology: Results for orders placed or performed during the hospital encounter of 12/11/16  Blood culture (routine x 2)     Status: None (Preliminary result)   Collection Time: 12/11/16  9:39 PM  Result Value Ref Range Status   Specimen Description BLOOD BLOOD LEFT FOREARM  Final   Special Requests  Final    BOTTLES DRAWN AEROBIC AND ANAEROBIC Blood Culture adequate volume   Culture NO GROWTH 2 DAYS  Final   Report Status PENDING  Incomplete  Blood culture (routine x 2)     Status: None (Preliminary result)   Collection Time: 12/11/16  9:39 PM  Result Value Ref Range Status   Specimen Description BLOOD LEFT ANTECUBITAL  Final   Special Requests   Final    BOTTLES DRAWN AEROBIC AND ANAEROBIC Blood Culture results may not be optimal due to an excessive volume of blood received in culture bottles   Culture NO GROWTH 2 DAYS  Final   Report Status PENDING  Incomplete  MRSA PCR Screening     Status: None   Collection Time: 12/12/16  4:55 PM   Result Value Ref Range Status   MRSA by PCR NEGATIVE NEGATIVE Final    Comment:        The GeneXpert MRSA Assay (FDA approved for NASAL specimens only), is one component of a comprehensive MRSA colonization surveillance program. It is not intended to diagnose MRSA infection nor to guide or monitor treatment for MRSA infections.     Studies/Results: Dg Chest 1 View  Result Date: 12/11/2016 CLINICAL DATA:  Patient with shortness of breath. EXAM: CHEST 1 VIEW COMPARISON:  Chest radiograph 12/10/2016 FINDINGS: Monitoring leads overlie the patient. Cardiomegaly. No consolidative pulmonary opacities. Basilar atelectasis. No pleural effusion or pneumothorax. IMPRESSION: No acute cardiopulmonary process. Cardiomegaly. Basilar atelectasis. Electronically Signed   By: Lovey Newcomer M.D.   On: 12/11/2016 21:19   US Venous Img Lower Bilateral  Result Date: 12/11/2016 CLINICAL DATA:  Bilateral lower extremity edema EXAM: BILATERAL LOWER EXTREMITY VENOUS DOPPLER ULTRASOUND TECHNIQUE: Gray-scale sonography with graded compression, as well as color Doppler and duplex ultrasound were performed to evaluate the lower extremity deep venous systems from the level of the common femoral vein and including the common femoral, femoral, profunda femoral, popliteal and calf veins including the posterior tibial, peroneal and gastrocnemius veins when visible. The superficial great saphenous vein was also interrogated. Spectral Doppler was utilized to evaluate flow at rest and with distal augmentation maneuvers in the common femoral, femoral and popliteal veins. COMPARISON:  None. FINDINGS: RIGHT LOWER EXTREMITY Common Femoral Vein: No evidence of thrombus. Normal compressibility, respiratory phasicity and response to augmentation. Saphenofemoral Junction: No evidence of thrombus. Normal compressibility and flow on color Doppler imaging. Profunda Femoral Vein: No evidence of thrombus. Normal compressibility and flow on color  Doppler imaging. Femoral Vein: No evidence of thrombus. Normal compressibility, respiratory phasicity and response to augmentation. Popliteal Vein: No evidence of thrombus. Normal compressibility, respiratory phasicity and response to augmentation. Calf Veins: No evidence of thrombus. Normal compressibility and flow on color Doppler imaging. Superficial Great Saphenous Vein: No evidence of thrombus. Normal compressibility and flow on color Doppler imaging. Venous Reflux:  None. Other Findings:  None. LEFT LOWER EXTREMITY Common Femoral Vein: No evidence of thrombus. Normal compressibility, respiratory phasicity and response to augmentation. Saphenofemoral Junction: No evidence of thrombus. Normal compressibility and flow on color Doppler imaging. Profunda Femoral Vein: No evidence of thrombus. Normal compressibility and flow on color Doppler imaging. Femoral Vein: No evidence of thrombus. Normal compressibility, respiratory phasicity and response to augmentation. Popliteal Vein: No evidence of thrombus. Normal compressibility, respiratory phasicity and response to augmentation. Calf Veins: No evidence of thrombus. Normal compressibility and flow on color Doppler imaging. Superficial Great Saphenous Vein: No evidence of thrombus. Normal compressibility and flow on color Doppler imaging. Venous Reflux:  None. Other Findings:  None. IMPRESSION: No evidence of DVT within either lower extremity. Electronically Signed   By: Inez Catalina M.D.   On: 12/11/2016 22:47    Assessment/Plan: Dennis Groman Buboltz Sr. is a 71 y.o. male with many medical issues including Addisons disease, CAD s/p MI, CHF, CVA,, testicular cancer admitted with SOB and mild hypoxia. I am consulted due to his elevated wbc 17 on 6/3 (had been 7.0 on 6/2).  He has no fever, cxr neg, UA neg, BCX negative.  I think his elevated wbc is from the decadron injection her received 6/2 at [facility] prior to transfer to ED. No evidence of infection at this point and  wbc decreasing. He has received vanco, zosyn and levofloxacin.  Procalcitonin is normal.  No fevers. Cultures negative  Recommendations Would rec dc abx as nml Procalcitonin and no evidence bacterial infection. Likely viral bronchitis Would follow off abx  Thank you very much for the consult. Will follow with you.  [place], Nimra Puccinelli P   12/13/2016, 2:00 PM

## 2016-12-13 NOTE — Care Management (Addendum)
Patient is very motivated to be able to participate in  in a cardiac and or pulmonary rehab.  Have left message with [name] with [facility] to discuss most appropriate program.   CM is leaning towards [facility]  [facility] rehab returned call and [facility] is most appropriate program for patient.

## 2016-12-13 NOTE — Progress Notes (Signed)
Patient alert and oriented, vss, no complaints of pain.  Back on RA, NSR on monitor.  Ambulated around nursing station several times today.

## 2016-12-13 NOTE — Progress Notes (Signed)
[facility] [place] NAME: Dennis [name]    MR#:  [id number]  DATE OF BIRTH:  Aug 21, 1945  SUBJECTIVE:  CHIEF COMPLAINT:  Patient Is feeling better tremors  improved . Cough is better REVIEW OF SYSTEMS:  CONSTITUTIONAL: No fever, fatigue or weakness.  EYES: No blurred or double vision.  EARS, NOSE, AND THROAT: No tinnitus or ear pain.  RESPIRATORY: Reports productive cough, shortness of breath with minimal exertion, denies wheezing or hemoptysis.  CARDIOVASCULAR: No chest pain, orthopnea, edema.  GASTROINTESTINAL: No nausea, vomiting, diarrhea or abdominal pain.  GENITOURINARY: No dysuria, hematuria.  ENDOCRINE: No polyuria, nocturia,  HEMATOLOGY: No anemia, easy bruising or bleeding SKIN: No rash or lesion. MUSCULOSKELETAL: No joint pain or arthritis.   NEUROLOGIC: Reporting jerky movements No tingling, numbness, weakness.  PSYCHIATRY: No anxiety or depression.   DRUG ALLERGIES:   Allergies  Allergen Reactions  . Bee Venom Anaphylaxis  . Shrimp [Shellfish Allergy] Anaphylaxis and Other (See Comments)    "just shrimp"  . Stadol [Butorphanol] Anaphylaxis and Other (See Comments)    respiratory  Distress, couldn't breathe, cardiac arrest  . Wasp Venom Anaphylaxis    VITALS:  Blood pressure (!) 142/79, pulse 78, temperature 97.7 F (36.5 C), temperature source Oral, resp. rate 14, height 6\' 7"  (2.007 m), weight (!) 172.4 kg (380 lb 1.2 oz), SpO2 95 %.  PHYSICAL EXAMINATION:  GENERAL:  71 y.o.-year-old patient lying in the bed with no acute distress.  EYES: Pupils equal, round, reactive to light and accommodation. No scleral icterus. Extraocular muscles intact.  HEENT: Head atraumatic, normocephalic. Oropharynx and nasopharynx clear.  NECK:  Supple, no jugular venous distention. No thyroid enlargement, no tenderness.  LUNGS: Coarse bronchial breath sounds bilaterally, no wheezing, rales,rhonchi or crepitation. No use of accessory  muscles of respiration.  CARDIOVASCULAR: S1, S2 normal. No murmurs, rubs, or gallops.  ABDOMEN: Soft, nontender, nondistended. Bowel sounds present. No organomegaly or mass.  EXTREMITIES: Bilateral upper extremities with tremors, no jerks noticed No pedal edema, cyanosis, or clubbing.  NEUROLOGIC: Cranial nerves II through XII are intact. Muscle strength 5/5 in all extremities. Sensation intact. Gait not checked.  PSYCHIATRIC: The patient is alert and oriented x 3.  SKIN: No obvious rash, lesion, or ulcer.    LABORATORY PANEL:   CBC  Recent Labs Lab 12/13/16 0547  WBC 14.6*  HGB 12.9*  HCT 38.8*  PLT 210   ------------------------------------------------------------------------------------------------------------------  Chemistries   Recent Labs Lab 12/10/16 1300  12/13/16 0547  NA 136  < > 133*  K 3.7  < > 4.6  CL 98*  < > 103  CO2 29  < > 23  GLUCOSE 120*  < > 198*  BUN 18  < > 31*  CREATININE 1.26*  < > 1.21  CALCIUM 9.7  < > 8.9  AST 20  --   --   ALT 17  --   --   ALKPHOS 56  --   --   BILITOT 1.2  --   --   < > = values in this interval not displayed. ------------------------------------------------------------------------------------------------------------------  Cardiac Enzymes  Recent Labs Lab 12/11/16 2102  TROPONINI <0.03   ------------------------------------------------------------------------------------------------------------------  RADIOLOGY:  Dg Chest 1 View  Result Date: 12/11/2016 CLINICAL DATA:  Patient with shortness of breath. EXAM: CHEST 1 VIEW COMPARISON:  Chest radiograph 12/10/2016 FINDINGS: Monitoring leads overlie the patient. Cardiomegaly. No consolidative pulmonary opacities. Basilar atelectasis. No pleural effusion or pneumothorax. IMPRESSION: No acute cardiopulmonary process.  Cardiomegaly. Basilar atelectasis. Electronically Signed   By: Lovey Newcomer M.D.   On: 12/11/2016 21:19   US Venous Img Lower Bilateral  Result Date:  12/11/2016 CLINICAL DATA:  Bilateral lower extremity edema EXAM: BILATERAL LOWER EXTREMITY VENOUS DOPPLER ULTRASOUND TECHNIQUE: Gray-scale sonography with graded compression, as well as color Doppler and duplex ultrasound were performed to evaluate the lower extremity deep venous systems from the level of the common femoral vein and including the common femoral, femoral, profunda femoral, popliteal and calf veins including the posterior tibial, peroneal and gastrocnemius veins when visible. The superficial great saphenous vein was also interrogated. Spectral Doppler was utilized to evaluate flow at rest and with distal augmentation maneuvers in the common femoral, femoral and popliteal veins. COMPARISON:  None. FINDINGS: RIGHT LOWER EXTREMITY Common Femoral Vein: No evidence of thrombus. Normal compressibility, respiratory phasicity and response to augmentation. Saphenofemoral Junction: No evidence of thrombus. Normal compressibility and flow on color Doppler imaging. Profunda Femoral Vein: No evidence of thrombus. Normal compressibility and flow on color Doppler imaging. Femoral Vein: No evidence of thrombus. Normal compressibility, respiratory phasicity and response to augmentation. Popliteal Vein: No evidence of thrombus. Normal compressibility, respiratory phasicity and response to augmentation. Calf Veins: No evidence of thrombus. Normal compressibility and flow on color Doppler imaging. Superficial Great Saphenous Vein: No evidence of thrombus. Normal compressibility and flow on color Doppler imaging. Venous Reflux:  None. Other Findings:  None. LEFT LOWER EXTREMITY Common Femoral Vein: No evidence of thrombus. Normal compressibility, respiratory phasicity and response to augmentation. Saphenofemoral Junction: No evidence of thrombus. Normal compressibility and flow on color Doppler imaging. Profunda Femoral Vein: No evidence of thrombus. Normal compressibility and flow on color Doppler imaging. Femoral Vein:  No evidence of thrombus. Normal compressibility, respiratory phasicity and response to augmentation. Popliteal Vein: No evidence of thrombus. Normal compressibility, respiratory phasicity and response to augmentation. Calf Veins: No evidence of thrombus. Normal compressibility and flow on color Doppler imaging. Superficial Great Saphenous Vein: No evidence of thrombus. Normal compressibility and flow on color Doppler imaging. Venous Reflux:  None. Other Findings:  None. IMPRESSION: No evidence of DVT within either lower extremity. Electronically Signed   By: Inez Catalina M.D.   On: 12/11/2016 22:47    EKG:   Orders placed or performed during the hospital encounter of 12/11/16  . EKG 12-Lead  . EKG 12-Lead  . ED EKG  . ED EKG  . ED EKG 12-Lead  . ED EKG 12-Lead    ASSESSMENT AND PLAN:   # Sepsis ([place]) - Probably from viral bronchitis Pro calcitonin negative Blood cultures with no growth so far Pending urine culture and sensitivity  MRSA PCR negative Follow-up sputum culture is collected  discontinued antibiotics, appreciate ID recommendations No pneumonia on chest x-ray Taper steroids   #  Acute on chronic kidney failure (HCC)  - IV fluids as above for hydration, avoid nephrotoxins and monitor for expected improvement  #Acute jerky movements on chronic tremors Neurology recommended to stop Neurontin Tremors significantly improved    Chronic diastolic CHF (congestive heart failure) ([place]) - continue home meds, cautious administration of IV fluids as above    Essential hypertension - stable, continue home meds    Adrenal insufficiency ([place]) - RESUME home dose hydrocortisone for acute illness   PT is recommending home health PT  All the records are reviewed and case discussed with Care Management/Social Workerr. Management plans discussed with the patient, family and they are in agreement.  CODE STATUS: FC  TOTAL TIME TAKING CARE OF THIS PATIENT: 36  minutes.   POSSIBLE  D/C IN AM  DAYS, DEPENDING ON CLINICAL CONDITION.  Note: This dictation was prepared with Dragon dictation along with smaller phrase technology. Any transcriptional errors that result from this process are unintentional.   Nicholes Mango M.D on 12/13/2016 at 4:04 PM  Between 7am to 6pm - Pager - [phone] After 6pm go to [email or web address] - password [email or web address] [facility]  [facility]  Office  [phone]  CC: Primary care physician; Jerrol Banana., MD

## 2016-12-14 ENCOUNTER — Inpatient Hospital Stay: Payer: Medicare Other

## 2016-12-14 LAB — URINE CULTURE: Special Requests: NORMAL

## 2016-12-14 LAB — EXPECTORATED SPUTUM ASSESSMENT W REFEX TO RESP CULTURE

## 2016-12-14 LAB — EXPECTORATED SPUTUM ASSESSMENT W GRAM STAIN, RFLX TO RESP C: Special Requests: NORMAL

## 2016-12-14 LAB — PROCALCITONIN: Procalcitonin: <0.1 ng/mL

## 2016-12-14 MED ORDER — LEVOTHYROXINE SODIUM 200 MCG PO TABS: 200.0000 ug | ORAL_TABLET | Freq: Every day | ORAL | 0 refills | Status: DC

## 2016-12-14 MED ORDER — ALBUTEROL SULFATE HFA 108 (90 BASE) MCG/ACT IN AERS: 2.0000 | INHALATION_SPRAY | Freq: Four times a day (QID) | RESPIRATORY_TRACT | 2 refills | Status: DC | PRN

## 2016-12-14 MED ORDER — BENZONATATE 200 MG PO CAPS: 200.0000 mg | ORAL_CAPSULE | Freq: Three times a day (TID) | ORAL | 0 refills | Status: DC | PRN

## 2016-12-14 MED ORDER — MOMETASONE FURO-FORMOTEROL FUM 200-5 MCG/ACT IN AERO: 2.0000 | INHALATION_SPRAY | Freq: Two times a day (BID) | RESPIRATORY_TRACT | 0 refills | Status: DC

## 2016-12-14 MED ORDER — FUROSEMIDE 40 MG PO TABS
40.0000 mg | ORAL_TABLET | Freq: Every day | ORAL | Status: DC
  Administered 2016-12-14: 40 mg via ORAL
  Filled 2016-12-14: qty 1

## 2016-12-14 MED ORDER — GUAIFENESIN-DM 100-10 MG/5ML PO SYRP: 5.0000 mL | ORAL_SOLUTION | ORAL | 0 refills | Status: DC | PRN

## 2016-12-14 MED ORDER — ESCITALOPRAM OXALATE 20 MG PO TABS: 20.0000 mg | ORAL_TABLET | Freq: Every day | ORAL | 0 refills | Status: DC

## 2016-12-14 NOTE — Care Management (Signed)
Patient expresses concern over having coughing episode and shortness of breath again and nothing at home to help. He is feeling short of breath at intervals even though his sats are acceptable.  If discharges home and this occurs patient will have to return to the ED.    Reaching out to attending to discuss patient' concerns.  CM again reviewed Medicare IM Notice.  While waiting for return call from attending, patient decided to proceed with discharge and could not be convinced to wait for Cm to speak with attending.

## 2016-12-14 NOTE — Care Management Important Message (Signed)
Important Message  Patient Details  Name: Dennis Patchin Rede Sr. MRN: [id number] Date of Birth: June 21, 1946   Medicare Important Message Given:  Yes Signed IM notice given    Katrina Stack, RN 12/14/2016, 9:56 AM

## 2016-12-14 NOTE — Progress Notes (Signed)
Physical Therapy Treatment Patient Details Name: Dennis [name] Sr. MRN: [id number] DOB: Jun 07, 1946 Today's Date: 12/14/2016    History of Present Illness 71 y.o. male who presents with Several days of progressive shortness of breath, with some wheezing. Recently in ER, was given some breathing treatments and felt better and was discharged. He come back with short of breath, wheezing. Pt generally has been feeling lower energy for months now. Pt with extensive history with low back surgeries, Addison's Dz, CVA ~1 year ago and complications associated with Agent Orange exposure.    PT Comments    Dennis [name] continues to mobilize well, demonstrating no signs of instability with transfers or ambulation in hallway.   Pt took one standing rest break due to fatigue, after ambulating 150 ft and then continued on to ambulate a total of 225 ft.  SpO2 down to 90% on RA at it's lowest but remained in mid 90s for majority of ambulation.  Wife is consumed by the fear that the pt might return home and sit all day and not ambulate and become weak.  Emphasized and encouraged the pt to ambulate in home at least every 45 minutes, to not sit longer than this, in an effort to improve his strength and endurance. Educated pt and pt's wife on energy conservation techniques including pursed lip breathing and taking rest breaks when pt fatigues or becomes SOB. Pt and pt's wife verbalized understanding and were appreciative.  Pt will benefit from continued skilled PT services to increase functional independence and safety.   Follow Up Recommendations  Home health PT (and [facility]-type exercise program)     Equipment Recommendations  None recommended by PT    Recommendations for Other Services       Precautions / Restrictions Precautions Precautions: Other (comment) Precaution Comments: Monitor O2 Restrictions Weight Bearing Restrictions: No    Mobility  Bed Mobility               General bed  mobility comments: Pt sitting in chair at start and end of session  Transfers Overall transfer level: Independent Equipment used: None             General transfer comment: Pt is steady and no signs of instability with sit<>stand.    Ambulation/Gait Ambulation/Gait assistance: Modified independent (Device/Increase time) Ambulation Distance (Feet): 225 Feet Assistive device: None Gait Pattern/deviations: WFL(Within Functional Limits)   Gait velocity interpretation: at or above normal speed for age/gender General Gait Details: No signs of instability or LOB.  Pt took one standing rest break due to fatigue, after ambulating 150 ft.  SpO2 down to 90% on RA at it's lowest but remained in mid 90s for majority of ambulation.     Stairs            Wheelchair Mobility    Modified Rankin (Stroke Patients Only)       Balance Overall balance assessment: Modified Independent (No signs of instability with static or dynamic activities)                                          Cognition Arousal/Alertness: Awake/alert Behavior During Therapy: WFL for tasks assessed/performed Overall Cognitive Status: Within Functional Limits for tasks assessed  Exercises      General Comments General comments (skin integrity, edema, etc.): Had extensive conversation with wife and pt about safety in the pt's home, the importance of energy conservation techniques (placing chair around the house and resting when pt fatigues or feels SOB). Emphasized and encouraged the pt to ambulate in home at least every 45 minutes, to not sit longer than this, in an effort to improve his strength and endurance.  Pt and pt's wife verbalized understanding and were appreciative. Wife is consumed by the thought that the pt might return home and sit all day and not ambulate. Again, emphasized to the pt the importance of getting up every 45 minutes to  walk around the home, pt and wife verbalized understanding.      Pertinent Vitals/Pain Pain Assessment: No/denies pain    Home Living                      Prior Function            PT Goals (current goals can now be found in the care plan section) Acute Rehab PT Goals Patient Stated Goal: go home PT Goal Formulation: With patient Time For Goal Achievement: 12/27/16 Potential to Achieve Goals: Good Progress towards PT goals: Progressing toward goals    Frequency    Min 2X/week      PT Plan Current plan remains appropriate    Co-evaluation              AM-PAC PT "6 Clicks" Daily Activity  Outcome Measure  Difficulty turning over in bed (including adjusting bedclothes, sheets and blankets)?: None Difficulty moving from lying on back to sitting on the side of the bed? : None Difficulty sitting down on and standing up from a chair with arms (e.g., wheelchair, bedside commode, etc,.)?: None Help needed moving to and from a bed to chair (including a wheelchair)?: None Help needed walking in hospital room?: None Help needed climbing 3-5 steps with a railing? : None 6 Click Score: 24    End of Session Equipment Utilized During Treatment: Gait belt Activity Tolerance: Patient tolerated treatment well;Patient limited by fatigue Patient left: with call bell/phone within reach;in chair;with family/visitor present;Other (comment) Air traffic controller in room) Nurse Communication: Mobility status;Other (comment) (SpO2) PT Visit Diagnosis: Difficulty in walking, not elsewhere classified (R26.2)     Time: 6834-1962 PT Time Calculation (min) (ACUTE ONLY): 31 min  Charges:  $Gait Training: 23-37 mins                    G Codes:       Collie Siad PT, DPT 12/14/2016, 1:10 PM

## 2016-12-14 NOTE — Consult Note (Signed)
   [facility] CM Inpatient Consult   12/14/2016  Dennis Chandran Lamartina Sr. [birth year] [id number]   Received a referral from inpatient CM for [place] Management program services. Transition of care will be conducted by Primary Care Provider. Attempted to speak with patient but patient was working with PT then was being visited by administration. Spoke with inpatient CM who agreed to give patient [facility] pamphlet. Needs identified are management of HF and multiple hospital admits in 6 months. Notification sent to Provider office contact personnel [facility] .  Made inpatient RNCM aware of the above. Please contact for further questions:  Georgana Romain RN, [facility] Liaison  [phone]) [employer] [phone]) Toll free office

## 2016-12-14 NOTE — Progress Notes (Signed)
Patient is discharge home in a stable condition, summary and f/u care given to both pt and wife , verbalized understanding ,left with wife 

## 2016-12-14 NOTE — Care Management (Addendum)
CM was approached by patient's wife who expressed concern that patient was not ready to discharge and "he needs help". Expressed concern that patient can not walk and had a rough night. CM relayed that if there is a discharge order present, that the  patient has the right to appeal the discharge. Briefly informed Ms Banner of how well patient did with physical therapy yesterday but would have patient reassessed by physical therapy.  CM attempted to inform Ms Coscia that CM would come to patient's room after  completing some tasks for another patient and she turned around abruptly and stated loudly "You are putting me off and I do not like that."  CM made a second attempt to speak with Ms Debruler and she stated the same thing.  CM met with patient in the presence of wife.  Reviewed Medicare IM notice; discussed referral for [facility] (brochure provided 6/5), home health services - nurse and physical therapy and home bound criteria.  Patient says that he had a nebulizer treatment last night which caused him to cough until 6:30A this morning. Said he coughed up a tissue mass that was sent to the lab.  Says he does feel weak today but "it is probably due to not sleeping. Agency preference for home health.  Heads up referral to [facility].  Discussed that patient does not have home nebulizer machine and he stated the physician has stated he will discharge home on inhalers.  During this interaction, ms Spindel did not make eye contact with CM or add to conversation .  [facility] received the referral  6/5  provided brochure for CM to give patient.

## 2016-12-14 NOTE — Progress Notes (Signed)
Pt complains of shortness of breath, audible wheeze present, lungs show exp wheeze, oxygen saturation is 97% on room air. I started a neb on patient and will update RN.

## 2016-12-14 NOTE — Discharge Summary (Signed)
[facility] NAME: Dennis [name]    MR#:  [id number]  DATE OF BIRTH:  1945-08-03  DATE OF ADMISSION:  12/11/2016 ADMITTING PHYSICIAN: Lance Coon, MD  DATE OF DISCHARGE: 12/14/2016  PRIMARY CARE PHYSICIAN: Jerrol Banana., MD    ADMISSION DIAGNOSIS:  Shortness of breath [R06.02] Respiratory distress [R06.03] SOB (shortness of breath) [R06.02] Acute renal insufficiency [N28.9]  DISCHARGE DIAGNOSIS:  Principal Problem:   Sepsis ([place]) Active Problems:   OSA on CPAP   Chronic diastolic CHF (congestive heart failure) (HCC)   Essential hypertension   Adrenal insufficiency (HCC)   CAP (community acquired pneumonia)   Acute on chronic kidney failure ([place])  SECONDARY DIAGNOSIS:   Past Medical History:  Diagnosis Date  . Acute CVA (cerebrovascular accident) ([place]) 01/22/2016  . Addison disease ([place])   . Arthritis    low back - DDD  . Cancer [facility])    Testicular Cancer  . Cellulitis of scrotum   . Cellulitis, scrotum 08/02/2014  . CHF (congestive heart failure) ([place])   . Chronic lower back pain    "from [place] 2007"  . Complication of anesthesia    Sometimes has N&V /w anesth.   . Coronary artery disease    Cath 2001  . Elevated PSA   . Epididymitis, left 08/04/2014  . History of chronic bronchitis   . History of gout   . Hypertension   . Hypocholesteremia   . Hypothyroidism   . Kidney stone   . Myocardial infarction [facility]) 2001   2001- cardiac cath., cardiac clearanece note dr Otho Perl 05-14-13 on chart, stress test results 02-21-12 on chart  . OSA on CPAP    cpap setting of 10  . Pneumonia 2000's and 2013  . PONV (postoperative nausea and vomiting)   . [facility] spotted fever   . Stroke [facility]) 2004   "right brain stem; no residual "    HOSPITAL COURSE:   # Sepsis ([place]) - Probably from viral bronchitis Pro calcitonin negative Blood cultures with no growth so far reviewed urine culture and sensitivity-  insignificant.  MRSA PCR negative Follow-up sputum culture is collected  discontinued antibiotics, appreciate ID recommendations No pneumonia on chest x-ray Taper steroids   #Acute on chronic kidney failure (HCC) - IV fluids as above for hydration, avoid nephrotoxins and monitor for expected improvement  #Acute jerky movements on chronic tremors Neurology recommended to stop Neurontin Tremors significantly improved  #Chronic diastolic CHF (congestive heart failure) ([place]) - continue home meds, cautious administration of IV fluids as above  renal func improved, resume lasix.  #Essential hypertension - stable, continue home meds  #Adrenal insufficiency ([place]) - RESUME home dose hydrocortisone for acute illness  DISCHARGE CONDITIONS:   Stable.  CONSULTS OBTAINED:  Treatment Team:  Alexis Goodell, MD Leonel Ramsay, MD  DRUG ALLERGIES:   Allergies  Allergen Reactions  . Bee Venom Anaphylaxis  . Shrimp [Shellfish Allergy] Anaphylaxis and Other (See Comments)    "just shrimp"  . Stadol [Butorphanol] Anaphylaxis and Other (See Comments)    respiratory  Distress, couldn't breathe, cardiac arrest  . Wasp Venom Anaphylaxis    DISCHARGE MEDICATIONS:   Current Discharge Medication List    START taking these medications   Details  albuterol (PROVENTIL HFA;VENTOLIN HFA) 108 (90 Base) MCG/ACT inhaler Inhale 2 puffs into the lungs every 6 (six) hours as needed for wheezing or shortness of breath. Qty: 1 Inhaler, Refills: 2    benzonatate (  TESSALON) 200 MG capsule Take 1 capsule (200 mg total) by mouth 3 (three) times daily as needed for cough. Qty: 20 capsule, Refills: 0    guaiFENesin-dextromethorphan (ROBITUSSIN DM) 100-10 MG/5ML syrup Take 5 mLs by mouth every 4 (four) hours as needed for cough. Qty: 118 mL, Refills: 0    mometasone-formoterol (DULERA) 200-5 MCG/ACT AERO Inhale 2 puffs into the lungs 2 (two) times daily. Qty: 1 Inhaler, Refills: 0       CONTINUE these medications which have CHANGED   Details  escitalopram (LEXAPRO) 20 MG tablet Take 1 tablet (20 mg total) by mouth daily. Qty: 30 tablet, Refills: 0    levothyroxine (SYNTHROID, LEVOTHROID) 200 MCG tablet Take 1 tablet (200 mcg total) by mouth daily before breakfast. Qty: 30 tablet, Refills: 0      CONTINUE these medications which have NOT CHANGED   Details  allopurinol (ZYLOPRIM) 100 MG tablet Take 1 tablet (100 mg total) by mouth 2 (two) times daily. Qty: 30 tablet, Refills: 12    aspirin 325 MG tablet Take 1 tablet (325 mg total) by mouth daily. Qty: 30 tablet, Refills: 2    atorvastatin (LIPITOR) 80 MG tablet Take 1 tablet (80 mg total) by mouth daily. Qty: 30 tablet, Refills: 12    clomiPHENE (CLOMID) 50 MG tablet Half tablet every other day Qty: 15 tablet, Refills: 3    clopidogrel (PLAVIX) 75 MG tablet Take 1 tablet (75 mg total) by mouth daily. Qty: 30 tablet, Refills: 12    colchicine 0.6 MG tablet Take 1 tablet (0.6 mg total) by mouth 2 (two) times daily. Qty: 60 tablet, Refills: 12    EPINEPHrine 0.3 mg/0.3 mL IJ SOAJ injection Inject 0.3 mLs (0.3 mg total) into the muscle as needed (allergic reaction). Qty: 1 Device, Refills: 12    furosemide (LASIX) 20 MG tablet Take 3 tablets (60 mg total) by mouth daily. Qty: 30 tablet, Refills: 1    hydrocortisone (CORTEF) 10 MG tablet Take 1 tablet (10 mg total) by mouth 2 (two) times daily. Qty: 60 tablet, Refills: 12    methocarbamol (ROBAXIN) 500 MG tablet Take 1 tablet (500 mg total) by mouth every 6 (six) hours as needed for muscle spasms. Qty: 60 tablet, Refills: 1    ondansetron (ZOFRAN ODT) 4 MG disintegrating tablet 4mg  ODT q4 hours prn nausea/vomit Qty: 12 tablet, Refills: 0    oxyCODONE-acetaminophen (PERCOCET) 5-325 MG tablet Take one ever 4-6 hours for pain Qty: 30 tablet, Refills: 0    polyethylene glycol (MIRALAX / GLYCOLAX) packet Take 17 g by mouth daily as needed for mild  constipation.     spironolactone (ALDACTONE) 100 MG tablet Take 100 mg by mouth daily.    tamsulosin (FLOMAX) 0.4 MG CAPS capsule Take 1 capsule (0.4 mg total) by mouth daily. Qty: 10 capsule, Refills: 0    testosterone (ANDRODERM) 4 MG/24HR PT24 patch 1 patch daily Qty: 30 patch, Refills: 2    tiZANidine (ZANAFLEX) 4 MG tablet TK 1 T PO TID PRN Refills: 0      STOP taking these medications     carvedilol (COREG) 3.125 MG tablet      gabapentin (NEURONTIN) 300 MG capsule      indomethacin (INDOCIN) 25 MG capsule      losartan (COZAAR) 50 MG tablet          DISCHARGE INSTRUCTIONS:    Follow with PCP in 1-2 weeks.  If you experience worsening of your admission symptoms, develop shortness of breath, life  threatening emergency, suicidal or homicidal thoughts you must seek medical attention immediately by calling 911 or calling your MD immediately  if symptoms less severe.  You Must read complete instructions/literature along with all the possible adverse reactions/side effects for all the Medicines you take and that have been prescribed to you. Take any new Medicines after you have completely understood and accept all the possible adverse reactions/side effects.   Please note  You were cared for by a hospitalist during your hospital stay. If you have any questions about your discharge medications or the care you received while you were in the hospital after you are discharged, you can call the unit and asked to speak with the hospitalist on call if the hospitalist that took care of you is not available. Once you are discharged, your primary care physician will handle any further medical issues. Please note that NO REFILLS for any discharge medications will be authorized once you are discharged, as it is imperative that you return to your primary care physician (or establish a relationship with a primary care physician if you do not have one) for your aftercare needs so that they  can reassess your need for medications and monitor your lab values.    Today   CHIEF COMPLAINT:   Chief Complaint  Patient presents with  . Asthma  . Shortness of Breath    HISTORY OF PRESENT ILLNESS:  Dennis [name]  is a 71 y.o. male presents with Several days of progressive shortness of breath, with some wheezing. Patient was seen here in our ED yesterday for the same, was given some breathing treatments and felt better and was discharged. This afternoon he began to feel short of breath again, and his wife noticed that he was wheezing. He came back to the ED and was noted initially to be somewhat hypoxic. His O2 sats corrected with supplemental oxygen. Here in the ED the patient was found to have an elevated white blood cell count as well as an elevated lactic acid. He was felt to have possible pneumonia, and hospitalists were called for admission  VITAL SIGNS:  Blood pressure 124/63, pulse 71, temperature 98 F (36.7 C), temperature source Oral, resp. rate 18, height 6\' 7"  (2.007 m), weight (!) 172.4 kg (380 lb 1.2 oz), SpO2 97 %.  I/O:   Intake/Output Summary (Last 24 hours) at 12/14/16 1436 Last data filed at 12/14/16 1000  Gross per 24 hour  Intake              480 ml  Output             1350 ml  Net             -870 ml    PHYSICAL EXAMINATION:   GENERAL:  71 y.o.-year-old patient lying in the bed with no acute distress.  EYES: Pupils equal, round, reactive to light and accommodation. No scleral icterus. Extraocular muscles intact.  HEENT: Head atraumatic, normocephalic. Oropharynx and nasopharynx clear.  NECK:  Supple, no jugular venous distention. No thyroid enlargement, no tenderness.  LUNGS: Coarse bronchial breath sounds bilaterally, no wheezing, rales,rhonchi or crepitation. No use of accessory muscles of respiration.  CARDIOVASCULAR: S1, S2 normal. No murmurs, rubs, or gallops.  ABDOMEN: Soft, nontender, nondistended. Bowel sounds present. No organomegaly or mass.   EXTREMITIES: Bilateral upper extremities with tremors, no jerks noticed No pedal edema, cyanosis, or clubbing.  NEUROLOGIC: Cranial nerves II through XII are intact. Muscle strength 5/5 in all extremities. Sensation intact.  Gait not checked.  PSYCHIATRIC: The patient is alert and oriented x 3.  SKIN: No obvious rash, lesion, or ulcer.   DATA REVIEW:   CBC  Recent Labs Lab 12/13/16 0547  WBC 14.6*  HGB 12.9*  HCT 38.8*  PLT 210    Chemistries   Recent Labs Lab 12/10/16 1300  12/13/16 0547  NA 136  < > 133*  K 3.7  < > 4.6  CL 98*  < > 103  CO2 29  < > 23  GLUCOSE 120*  < > 198*  BUN 18  < > 31*  CREATININE 1.26*  < > 1.21  CALCIUM 9.7  < > 8.9  AST 20  --   --   ALT 17  --   --   ALKPHOS 56  --   --   BILITOT 1.2  --   --   < > = values in this interval not displayed.  Cardiac Enzymes  Recent Labs Lab 12/11/16 2102  TROPONINI <0.03    Microbiology Results  Results for orders placed or performed during the hospital encounter of 12/11/16  Blood culture (routine x 2)     Status: None (Preliminary result)   Collection Time: 12/11/16  9:39 PM  Result Value Ref Range Status   Specimen Description BLOOD BLOOD LEFT FOREARM  Final   Special Requests   Final    BOTTLES DRAWN AEROBIC AND ANAEROBIC Blood Culture adequate volume   Culture NO GROWTH 3 DAYS  Final   Report Status PENDING  Incomplete  Blood culture (routine x 2)     Status: None (Preliminary result)   Collection Time: 12/11/16  9:39 PM  Result Value Ref Range Status   Specimen Description BLOOD LEFT ANTECUBITAL  Final   Special Requests   Final    BOTTLES DRAWN AEROBIC AND ANAEROBIC Blood Culture results may not be optimal due to an excessive volume of blood received in culture bottles   Culture NO GROWTH 3 DAYS  Final   Report Status PENDING  Incomplete  Urine culture     Status: Abnormal   Collection Time: 12/11/16  9:39 PM  Result Value Ref Range Status   Specimen Description URINE, CLEAN CATCH   Final   Special Requests Normal  Final   Culture MULTIPLE SPECIES PRESENT, SUGGEST RECOLLECTION (A)  Final   Report Status 12/14/2016 FINAL  Final  MRSA PCR Screening     Status: None   Collection Time: 12/12/16  4:55 PM  Result Value Ref Range Status   MRSA by PCR NEGATIVE NEGATIVE Final    Comment:        The GeneXpert MRSA Assay (FDA approved for NASAL specimens only), is one component of a comprehensive MRSA colonization surveillance program. It is not intended to diagnose MRSA infection nor to guide or monitor treatment for MRSA infections.   Culture, expectorated sputum-assessment     Status: None   Collection Time: 12/14/16  6:30 AM  Result Value Ref Range Status   Specimen Description EXPECTORATED SPUTUM  Final   Special Requests Normal  Final   Sputum evaluation THIS SPECIMEN IS ACCEPTABLE FOR SPUTUM CULTURE  Final   Report Status 12/14/2016 FINAL  Final  Culture, respiratory (NON-Expectorated)     Status: None (Preliminary result)   Collection Time: 12/14/16  6:30 AM  Result Value Ref Range Status   Specimen Description EXPECTORATED SPUTUM  Final   Special Requests Normal Reflexed from [id number]  Final   Gram Stain  Final    RARE WBC PRESENT, PREDOMINANTLY PMN RARE GRAM NEGATIVE RODS Performed at [facility] [address] [place], [place] [address]    Culture PENDING  Incomplete   Report Status PENDING  Incomplete    RADIOLOGY:  Dg Chest 2 View  Result Date: 12/14/2016 CLINICAL DATA:  71 year old admitted on 12/11/2016 with wheezing and shortness of breath. Prior MI. Personal history of testicular cancer. EXAM: CHEST  2 VIEW COMPARISON:  12/11/2016, 12/10/2016 and earlier. FINDINGS: Interval resolution of the prominent bronchovascular markings diffusely and central peribronchial thickening since the examination 3 days ago. Mild chronic elevation of the left hemidiaphragm and associated scar/atelectasis involving the left lower lobe, unchanged. Lungs  otherwise clear. Cardiac silhouette upper normal in size to mildly enlarged, unchanged. Thoracic aorta mildly tortuous and atherosclerotic, unchanged. Hilar and mediastinal contours otherwise unremarkable. Implantable cardiac recording device in the subcutaneous tissues of the medial lower left chest wall. IMPRESSION: 1. Interval resolution of the acute bronchitic changes identified 3 days ago. No acute cardiopulmonary disease currently. 2. Stable mild chronic elevation of left hemidiaphragm and chronic scar/atelectasis involving the left lower lobe. 3. Stable borderline heart size without pulmonary edema. Electronically Signed   By: Evangeline Dakin M.D.   On: 12/14/2016 13:25    EKG:   Orders placed or performed during the hospital encounter of 12/11/16  . EKG 12-Lead  . EKG 12-Lead  . ED EKG  . ED EKG  . ED EKG 12-Lead  . ED EKG 12-Lead      Management plans discussed with the patient, family and they are in agreement.  CODE STATUS:     Code Status Orders        Start     Ordered   12/12/16 0056  Full code  Continuous     12/12/16 0055    Code Status History    Date Active Date Inactive Code Status Order ID Comments User Context   11/15/2016 12:43 PM 11/17/2016  5:02 PM Full Code [id number]  Waldemar Dickens, MD ED   09/02/2016  3:57 PM 09/03/2016  5:33 PM Full Code [id number]  Eustace Moore, MD Inpatient   08/23/2016 11:13 AM 09/02/2016  3:57 PM Full Code [id number]  Eustace Moore, MD Inpatient   03/18/2016 12:37 AM 03/19/2016  3:51 PM Full Code [id number]  Lily Kocher, MD Inpatient   01/27/2016  4:33 PM 01/28/2016  7:03 PM Full Code [id number]  Evans Lance, MD Inpatient   01/25/2016  8:47 PM 01/27/2016  4:33 PM Full Code [id number]  Verner Mould, MD Inpatient   01/21/2016  7:07 PM 01/23/2016  3:20 PM Full Code [id number]  Elberta Leatherwood, MD Inpatient   08/14/2015  2:27 AM 08/15/2015  3:49 PM Full Code [id number]  Saundra Shelling, MD ED   08/04/2014  6:26 PM 08/07/2014  9:55 PM Full  Code [id number]  [name], MD Inpatient   08/03/2014  1:03 AM 08/03/2014  4:32 PM Full Code [id number]  Toy Baker, MD Inpatient   07/17/2013  5:20 PM 07/19/2013  1:25 PM Full Code [id number]  Eustace Moore, MD Inpatient    Advance Directive Documentation     Most Recent Value  Type of Advance Directive  Healthcare Power of Attorney, Living will  Pre-existing out of facility DNR order (yellow form or pink MOST form)  -  "MOST" Form in Place?  -      TOTAL TIME TAKING CARE OF THIS PATIENT: 35 minutes.  Vaughan Basta M.D on 12/14/2016 at 2:36 PM  Between 7am to 6pm - Pager - [phone]  After 6pm go to [email or web address] - password [email or web address] [place] [facility]  Office  [phone]  CC: Primary care physician; Jerrol Banana., MD   Note: This dictation was prepared with Dragon dictation along with smaller phrase technology. Any transcriptional errors that result from this process are unintentional.

## 2016-12-14 NOTE — Progress Notes (Signed)
[place] INFECTIOUS DISEASE PROGRESS NOTE Date of Admission:  12/11/2016     ID: Dennis Nations Wadding Sr. is a 71 y.o. male with leukocytosis Principal Problem:   Sepsis ([place]) Active Problems:   OSA on CPAP   Chronic diastolic CHF (congestive heart failure) (HCC)   Essential hypertension   Adrenal insufficiency (HCC)   CAP (community acquired pneumonia)   Acute on chronic kidney failure (HCC)  Subjective: No fevers.got up and walked halls today. Has some coughing now but not on O2 ROS  Eleven systems are reviewed and negative except per hpi  Medications:  Antibiotics Given (last 72 hours)    Date/Time Action Medication Dose Rate   12/11/16 2201 New Bag/Given   levofloxacin (LEVAQUIN) IVPB 750 mg 750 mg 100 mL/hr   12/12/16 0201 New Bag/Given   vancomycin (VANCOCIN) 1,500 mg in sodium chloride 0.9 % 500 mL IVPB 1,500 mg 250 mL/hr   12/12/16 0224 New Bag/Given   piperacillin-tazobactam (ZOSYN) IVPB 3.375 g 3.375 g 12.5 mL/hr   12/12/16 0919 New Bag/Given   piperacillin-tazobactam (ZOSYN) IVPB 3.375 g 3.375 g 12.5 mL/hr   12/12/16 1112 New Bag/Given   vancomycin (VANCOCIN) 1,500 mg in sodium chloride 0.9 % 500 mL IVPB 1,500 mg 250 mL/hr   12/12/16 1610 Given   doxycycline (VIBRA-TABS) tablet 100 mg 100 mg    12/13/16 0917 Given   doxycycline (VIBRA-TABS) tablet 100 mg 100 mg      . aspirin  325 mg Oral Daily  . atorvastatin  80 mg Oral Daily  . clopidogrel  75 mg Oral Daily  . colchicine  0.6 mg Oral BID  . escitalopram  20 mg Oral Daily  . furosemide  40 mg Oral Daily  . heparin  5,000 Units Subcutaneous Q8H  . hydrocortisone  10 mg Oral BID  . levothyroxine  200 mcg Oral QAC breakfast  . methylPREDNISolone (SOLU-MEDROL) injection  40 mg Intravenous Q24H  . spironolactone  100 mg Oral Daily    Objective: Vital signs in last 24 hours: Temp:  [98 F (36.7 C)-98.3 F (36.8 C)] 98 F (36.7 C) (06/06 0814) Pulse Rate:  [71-82] 71 (06/06 0814) Resp:  [16-20] 18  (06/06 0814) BP: (124-139)/(63-89) 124/63 (06/06 0814) SpO2:  [91 %-97 %] 97 % (06/06 1009) Constitutional:  oriented to person, place, and time. Morbidly obese 380 #s HENT: [place]/AT, PERRLA, no scleral icterus Mouth/Throat: Oropharynx is clear and moist. No oropharyngeal exudate.  Cardiovascular: distant   Pulmonary/Chest: good air movement Neck = supple, no nuchal rigidity Abdominal: Soft, large pannus. Bowel sounds are normal.  exhibits no distension. There is no tenderness.  Lymphadenopathy: no cervical adenopathy. No axillary adenopathy Neurological: alert and oriented to person, place, and time.  Ext 1+ bil edema Skin: Skin is warm and dry. LLE with mild brawny discoloration  Psychiatric: a normal mood and affect.  behavior is normal.    Lab Results  Recent Labs  12/12/16 0125 12/13/16 0547  WBC 13.0* 14.6*  HGB 12.7* 12.9*  HCT 37.5* 38.8*  NA 134* 133*  K 3.9 4.6  CL 99* 103  CO2 25 23  BUN 34* 31*  CREATININE 1.59* 1.21    Microbiology: Results for orders placed or performed during the hospital encounter of 12/11/16  Blood culture (routine x 2)     Status: None (Preliminary result)   Collection Time: 12/11/16  9:39 PM  Result Value Ref Range Status   Specimen Description BLOOD BLOOD LEFT FOREARM  Final   Special  Requests   Final    BOTTLES DRAWN AEROBIC AND ANAEROBIC Blood Culture adequate volume   Culture NO GROWTH 3 DAYS  Final   Report Status PENDING  Incomplete  Blood culture (routine x 2)     Status: None (Preliminary result)   Collection Time: 12/11/16  9:39 PM  Result Value Ref Range Status   Specimen Description BLOOD LEFT ANTECUBITAL  Final   Special Requests   Final    BOTTLES DRAWN AEROBIC AND ANAEROBIC Blood Culture results may not be optimal due to an excessive volume of blood received in culture bottles   Culture NO GROWTH 3 DAYS  Final   Report Status PENDING  Incomplete  Urine culture     Status: Abnormal   Collection Time: 12/11/16  9:39 PM   Result Value Ref Range Status   Specimen Description URINE, CLEAN CATCH  Final   Special Requests Normal  Final   Culture MULTIPLE SPECIES PRESENT, SUGGEST RECOLLECTION (A)  Final   Report Status 12/14/2016 FINAL  Final  MRSA PCR Screening     Status: None   Collection Time: 12/12/16  4:55 PM  Result Value Ref Range Status   MRSA by PCR NEGATIVE NEGATIVE Final    Comment:        The GeneXpert MRSA Assay (FDA approved for NASAL specimens only), is one component of a comprehensive MRSA colonization surveillance program. It is not intended to diagnose MRSA infection nor to guide or monitor treatment for MRSA infections.   Culture, expectorated sputum-assessment     Status: None   Collection Time: 12/14/16  6:30 AM  Result Value Ref Range Status   Specimen Description EXPECTORATED SPUTUM  Final   Special Requests Normal  Final   Sputum evaluation THIS SPECIMEN IS ACCEPTABLE FOR SPUTUM CULTURE  Final   Report Status 12/14/2016 FINAL  Final    Studies/Results: Dg Chest 2 View  Result Date: 12/14/2016 CLINICAL DATA:  71 year old admitted on 12/11/2016 with wheezing and shortness of breath. Prior MI. Personal history of testicular cancer. EXAM: CHEST  2 VIEW COMPARISON:  12/11/2016, 12/10/2016 and earlier. FINDINGS: Interval resolution of the prominent bronchovascular markings diffusely and central peribronchial thickening since the examination 3 days ago. Mild chronic elevation of the left hemidiaphragm and associated scar/atelectasis involving the left lower lobe, unchanged. Lungs otherwise clear. Cardiac silhouette upper normal in size to mildly enlarged, unchanged. Thoracic aorta mildly tortuous and atherosclerotic, unchanged. Hilar and mediastinal contours otherwise unremarkable. Implantable cardiac recording device in the subcutaneous tissues of the medial lower left chest wall. IMPRESSION: 1. Interval resolution of the acute bronchitic changes identified 3 days ago. No acute  cardiopulmonary disease currently. 2. Stable mild chronic elevation of left hemidiaphragm and chronic scar/atelectasis involving the left lower lobe. 3. Stable borderline heart size without pulmonary edema. Electronically Signed   By: Evangeline Dakin M.D.   On: 12/14/2016 13:25    Assessment/Plan: Dennis Daywalt Neece Sr. is a 71 y.o. male with many medical issues including Addisons disease, CAD s/p MI, CHF, CVA,, testicular cancer admitted with SOB and mild hypoxia. I am consulted due to his elevated wbc 17 on 6/3 (had been 7.0 on 6/2).  He has no fever, cxr neg, UA neg, BCX negative.  I think his elevated wbc is from the decadron injection her received 6/2 at [facility] prior to transfer to ED. No evidence of infection at this point and wbc decreasing. He has received vanco, zosyn and levofloxacin.  Procalcitonin is normal.  No fevers. Cultures  negative  Recommendations Would rec continue off abx  nml Procalcitonin and no evidence bacterial infection. Likely viral bronchitis Would follow off abx  Thank you very much for the consult. Will follow with you.  Dalary Hollar P   12/14/2016, 1:54 PM

## 2016-12-15 LAB — BLOOD GAS, VENOUS
Acid-Base Excess: 0.9 mmol/L (ref 0.0–2.0)
Bicarbonate: 27.5 mmol/L (ref 20.0–28.0)
Patient temperature: 37
pCO2, Ven: 51 mmHg (ref 44.0–60.0)
pH, Ven: 7.34 (ref 7.250–7.430)

## 2016-12-16 LAB — CULTURE, BLOOD (ROUTINE X 2)
Culture: NO GROWTH
Culture: NO GROWTH
Special Requests: ADEQUATE

## 2016-12-16 LAB — CULTURE, RESPIRATORY: Special Requests: NORMAL

## 2016-12-16 LAB — CULTURE, RESPIRATORY W GRAM STAIN: Culture: NORMAL

## 2016-12-22 ENCOUNTER — Ambulatory Visit (INDEPENDENT_AMBULATORY_CARE_PROVIDER_SITE_OTHER): Payer: Medicare Other | Admitting: Family Medicine

## 2016-12-22 ENCOUNTER — Encounter: Payer: Self-pay | Admitting: Family Medicine

## 2016-12-22 ENCOUNTER — Ambulatory Visit (INDEPENDENT_AMBULATORY_CARE_PROVIDER_SITE_OTHER): Payer: Medicare Other | Admitting: *Deleted

## 2016-12-22 VITALS — BP 116/72 | HR 91 | Resp 20 | Wt 373.0 lb

## 2016-12-22 DIAGNOSIS — R5383 Other fatigue: Secondary | ICD-10-CM

## 2016-12-22 DIAGNOSIS — I639 Cerebral infarction, unspecified: Secondary | ICD-10-CM | POA: Diagnosis not present

## 2016-12-22 DIAGNOSIS — I10 Essential (primary) hypertension: Secondary | ICD-10-CM | POA: Diagnosis not present

## 2016-12-22 DIAGNOSIS — N179 Acute kidney failure, unspecified: Secondary | ICD-10-CM | POA: Diagnosis not present

## 2016-12-22 DIAGNOSIS — N189 Chronic kidney disease, unspecified: Secondary | ICD-10-CM

## 2016-12-22 DIAGNOSIS — I25119 Atherosclerotic heart disease of native coronary artery with unspecified angina pectoris: Secondary | ICD-10-CM

## 2016-12-22 DIAGNOSIS — I5032 Chronic diastolic (congestive) heart failure: Secondary | ICD-10-CM

## 2016-12-22 NOTE — Progress Notes (Signed)
Patient: Dennis Bourcier Schlender Sr. Male    DOB: 13-Jul-1945   71 y.o.   MRN: [id number] Visit Date: 12/22/2016  Today's Provider: Wilhemena Durie, MD   Chief Complaint  Patient presents with  . Hospitalization Follow-up   Subjective:    HPI     Follow up Hospitalization  Patient was admitted to [facility] on 12/11/2016 and discharged on 12/14/2016. He was treated for SOB, respiratory distress, acute renal insufficiency.   HOSPITAL COURSE:   # Sepsis ([place]) - Probably from viral bronchitis Pro calcitonin negative Blood cultures with no growth so far  urine culture was reviewed and sensitivity- insignificant. MRSA PCR negative Follow-up sputum culture is collected discontinued antibiotics, appreciate ID recommendations No pneumonia on chest x-ray Taper steroids   #Acute on chronic kidney failure (HCC) - IV fluids as above for hydration, avoid nephrotoxins and monitor for expected improvement  #Acute jerky movements on chronic tremors Neurology recommended to stop Neurontin Tremors significantly improved  #Chronic diastolic CHF (congestive heart failure) ([place]) - continue home meds, cautious administration of IV fluids as above  renal func improved, resume lasix.  #Essential hypertension - stable, continue home meds  #Adrenal insufficiency ([place]) - RESUME home dose hydrocortisone for acute illness    He reports good compliance with treatment. He reports this condition is Improved.  ------------------------------------------------------------------------------------  Dennis [name] has an appointment with his neurosurgeon tomorrow for back pain.   Allergies  Allergen Reactions  . Bee Venom Anaphylaxis  . Shrimp [Shellfish Allergy] Anaphylaxis and Other (See Comments)    "just shrimp"  . Stadol [Butorphanol] Anaphylaxis and Other (See Comments)    respiratory  Distress, couldn't breathe, cardiac arrest  . Wasp Venom Anaphylaxis     Current  Outpatient Prescriptions:  .  albuterol (PROVENTIL HFA;VENTOLIN HFA) 108 (90 Base) MCG/ACT inhaler, Inhale 2 puffs into the lungs every 6 (six) hours as needed for wheezing or shortness of breath., Disp: 1 Inhaler, Rfl: 2 .  allopurinol (ZYLOPRIM) 100 MG tablet, Take 1 tablet (100 mg total) by mouth 2 (two) times daily., Disp: 30 tablet, Rfl: 12 .  aspirin 325 MG tablet, Take 1 tablet (325 mg total) by mouth daily., Disp: 30 tablet, Rfl: 2 .  atorvastatin (LIPITOR) 80 MG tablet, Take 1 tablet (80 mg total) by mouth daily., Disp: 30 tablet, Rfl: 12 .  benzonatate (TESSALON) 200 MG capsule, Take 1 capsule (200 mg total) by mouth 3 (three) times daily as needed for cough., Disp: 20 capsule, Rfl: 0 .  ciprofloxacin (CIPRO) 500 MG tablet, Take 500 mg by mouth daily with breakfast. , Disp: , Rfl:  .  clomiPHENE (CLOMID) 50 MG tablet, Half tablet every other day (Patient taking differently: Take 50 mg by mouth daily. ), Disp: 15 tablet, Rfl: 3 .  clopidogrel (PLAVIX) 75 MG tablet, Take 1 tablet (75 mg total) by mouth daily., Disp: 30 tablet, Rfl: 12 .  colchicine 0.6 MG tablet, Take 1 tablet (0.6 mg total) by mouth 2 (two) times daily., Disp: 60 tablet, Rfl: 12 .  EPINEPHrine 0.3 mg/0.3 mL IJ SOAJ injection, Inject 0.3 mLs (0.3 mg total) into the muscle as needed (allergic reaction)., Disp: 1 Device, Rfl: 12 .  escitalopram (LEXAPRO) 20 MG tablet, Take 1 tablet (20 mg total) by mouth daily., Disp: 30 tablet, Rfl: 0 .  furosemide (LASIX) 20 MG tablet, Take 3 tablets (60 mg total) by mouth daily., Disp: 30 tablet, Rfl: 1 .  guaiFENesin-dextromethorphan (ROBITUSSIN DM) 100-10 MG/5ML syrup,  Take 5 mLs by mouth every 4 (four) hours as needed for cough., Disp: 118 mL, Rfl: 0 .  hydrocortisone (CORTEF) 10 MG tablet, Take 1 tablet (10 mg total) by mouth 2 (two) times daily., Disp: 60 tablet, Rfl: 12 .  levothyroxine (SYNTHROID, LEVOTHROID) 200 MCG tablet, Take 1 tablet (200 mcg total) by mouth daily before  breakfast., Disp: 30 tablet, Rfl: 0 .  methocarbamol (ROBAXIN) 500 MG tablet, Take 1 tablet (500 mg total) by mouth every 6 (six) hours as needed for muscle spasms., Disp: 60 tablet, Rfl: 1 .  mometasone-formoterol (DULERA) 200-5 MCG/ACT AERO, Inhale 2 puffs into the lungs 2 (two) times daily., Disp: 1 Inhaler, Rfl: 0 .  oxyCODONE-acetaminophen (PERCOCET) 5-325 MG tablet, Take one ever 4-6 hours for pain, Disp: 30 tablet, Rfl: 0 .  polyethylene glycol (MIRALAX / GLYCOLAX) packet, Take 17 g by mouth daily as needed for mild constipation. , Disp: , Rfl:  .  spironolactone (ALDACTONE) 100 MG tablet, Take 100 mg by mouth daily., Disp: , Rfl:  .  testosterone (ANDRODERM) 4 MG/24HR PT24 patch, 1 patch daily (Patient taking differently: Place 2 patches onto the skin daily. Place 2 patches, 1 on each side of body), Disp: 30 patch, Rfl: 2 .  tiZANidine (ZANAFLEX) 4 MG tablet, TK 1 T PO TID PRN, Disp: , Rfl: 0 .  ondansetron (ZOFRAN ODT) 4 MG disintegrating tablet, 4mg  ODT q4 hours prn nausea/vomit (Patient not taking: Reported on 12/22/2016), Disp: 12 tablet, Rfl: 0  Review of Systems  Constitutional: Positive for diaphoresis (night). Negative for chills and fever.  Eyes: Negative.   Respiratory: Positive for cough and wheezing. Negative for shortness of breath.   Cardiovascular: Negative for chest pain.  Endocrine: Negative.   Genitourinary: Negative for decreased urine volume, difficulty urinating, dysuria, frequency and urgency.  Musculoskeletal: Positive for back pain.  Allergic/Immunologic: Negative.   Neurological: Positive for tremors (caused by Addison's. Was questioned that this could be due to non-hodgkin's lymphoma. He has been exposed to [employer], and is currently undergoing testing for this.).  Psychiatric/Behavioral: Negative.     Social History  Substance Use Topics  . Smoking status: Never Smoker  . Smokeless tobacco: Never Used  . Alcohol use Yes     Comment: " I drink wine  about a year ago" 08/07/15   Objective:   BP 116/72 (BP Location: Left Arm, Patient Position: Sitting, Cuff Size: Large)   Pulse 91   Resp 20   Wt (!) 373 lb (169.2 kg)   BMI 42.02 kg/m  Vitals:   12/22/16 1113  BP: 116/72  Pulse: 91  Resp: 20  Weight: (!) 373 lb (169.2 kg)     Physical Exam  Constitutional: He is oriented to person, place, and time. He appears well-developed and well-nourished.  Morbidly obese white male in no acute distress.  HENT:  Head: Normocephalic and atraumatic.  Right Ear: External ear normal.  Left Ear: External ear normal.  Nose: Nose normal.  Eyes: Conjunctivae are normal. No scleral icterus.  Neck: No thyromegaly present.  Cardiovascular: Normal rate, regular rhythm and normal heart sounds.   Pulmonary/Chest: Effort normal and breath sounds normal.  Abdominal: Soft.  Neurological: He is alert and oriented to person, place, and time.  Skin: Skin is warm and dry.  Psychiatric: He has a normal mood and affect. His behavior is normal. Judgment and thought content normal.        Assessment & Plan:     1. Chronic diastolic  CHF (congestive heart failure) (HCC)  - Comprehensive metabolic panel  2. Acute renal failure superimposed on chronic kidney disease, unspecified CKD stage, unspecified acute renal failure type ([place])   3. Essential hypertension   4. Coronary artery disease involving native coronary artery of native heart with angina pectoris ([place])   5. Fatigue, unspecified type  - CBC with Differential/Platelet - TSH 6.Cryptogenic Stroke 7.Adrenal insufficiency     I have done the exam and reviewed the above chart and it is accurate to the best of my knowledge. Development worker, community has been used in this note in any air is in the dictation or transcription are unintentional.  Wilhemena Durie, MD  [place]

## 2016-12-23 DIAGNOSIS — M5126 Other intervertebral disc displacement, lumbar region: Secondary | ICD-10-CM | POA: Diagnosis not present

## 2016-12-23 DIAGNOSIS — M5440 Lumbago with sciatica, unspecified side: Secondary | ICD-10-CM | POA: Diagnosis not present

## 2016-12-23 NOTE — Progress Notes (Signed)
Carelink Summary Report / Loop Recorder 

## 2016-12-26 ENCOUNTER — Ambulatory Visit: Payer: Medicare Other | Admitting: Family Medicine

## 2016-12-26 ENCOUNTER — Other Ambulatory Visit (HOSPITAL_COMMUNITY): Payer: Self-pay | Admitting: Neurological Surgery

## 2016-12-26 ENCOUNTER — Other Ambulatory Visit: Payer: Self-pay | Admitting: Neurological Surgery

## 2016-12-26 DIAGNOSIS — M5440 Lumbago with sciatica, unspecified side: Secondary | ICD-10-CM

## 2017-01-02 LAB — CUP PACEART REMOTE DEVICE CHECK
Date Time Interrogation Session: 20180615003553
Implantable Pulse Generator Implant Date: 20170719

## 2017-01-02 NOTE — Progress Notes (Signed)
Carelink summary report received. Battery status OK. Normal device function. No new symptom episodes, tachy episodes, brady, or pause episodes. No new AF episodes. Monthly summary reports and ROV/PRN 

## 2017-01-03 ENCOUNTER — Ambulatory Visit (HOSPITAL_COMMUNITY)
Admission: RE | Admit: 2017-01-03 | Discharge: 2017-01-03 | Disposition: A | Discharge status: Home / Self Care | Payer: Medicare Other | Source: Ambulatory Visit | Attending: Neurological Surgery | Admitting: Neurological Surgery

## 2017-01-03 DIAGNOSIS — M5440 Lumbago with sciatica, unspecified side: Secondary | ICD-10-CM | POA: Diagnosis not present

## 2017-01-03 DIAGNOSIS — Z981 Arthrodesis status: Secondary | ICD-10-CM | POA: Diagnosis not present

## 2017-01-03 DIAGNOSIS — M4326 Fusion of spine, lumbar region: Secondary | ICD-10-CM | POA: Diagnosis not present

## 2017-01-03 MED ORDER — GADOBENATE DIMEGLUMINE 529 MG/ML IV SOLN
20.0000 mL | Freq: Once | INTRAVENOUS | Status: AC | PRN
  Administered 2017-01-03: 20 mL via INTRAVENOUS
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
## 2017-01-09 ENCOUNTER — Other Ambulatory Visit: Payer: Medicare Other

## 2017-01-13 ENCOUNTER — Emergency Department (HOSPITAL_COMMUNITY)
Admission: EM | Admit: 2017-01-13 | Discharge: 2017-01-13 | Disposition: A | Discharge status: Home / Self Care | Payer: Medicare Other | Attending: Emergency Medicine | Admitting: Emergency Medicine

## 2017-01-13 ENCOUNTER — Emergency Department (HOSPITAL_COMMUNITY): Payer: Medicare Other

## 2017-01-13 ENCOUNTER — Encounter (HOSPITAL_COMMUNITY): Payer: Self-pay | Admitting: Emergency Medicine

## 2017-01-13 DIAGNOSIS — Z79899 Other long term (current) drug therapy: Secondary | ICD-10-CM | POA: Insufficient documentation

## 2017-01-13 DIAGNOSIS — Z96652 Presence of left artificial knee joint: Secondary | ICD-10-CM | POA: Diagnosis not present

## 2017-01-13 DIAGNOSIS — M546 Pain in thoracic spine: Secondary | ICD-10-CM

## 2017-01-13 DIAGNOSIS — I509 Heart failure, unspecified: Secondary | ICD-10-CM | POA: Diagnosis not present

## 2017-01-13 DIAGNOSIS — Z8673 Personal history of transient ischemic attack (TIA), and cerebral infarction without residual deficits: Secondary | ICD-10-CM | POA: Insufficient documentation

## 2017-01-13 DIAGNOSIS — R05 Cough: Secondary | ICD-10-CM | POA: Diagnosis not present

## 2017-01-13 DIAGNOSIS — E039 Hypothyroidism, unspecified: Secondary | ICD-10-CM | POA: Diagnosis not present

## 2017-01-13 DIAGNOSIS — Z8547 Personal history of malignant neoplasm of testis: Secondary | ICD-10-CM | POA: Insufficient documentation

## 2017-01-13 DIAGNOSIS — M549 Dorsalgia, unspecified: Secondary | ICD-10-CM

## 2017-01-13 DIAGNOSIS — I2510 Atherosclerotic heart disease of native coronary artery without angina pectoris: Secondary | ICD-10-CM | POA: Insufficient documentation

## 2017-01-13 DIAGNOSIS — I110 Hypertensive heart disease with heart failure: Secondary | ICD-10-CM | POA: Insufficient documentation

## 2017-01-13 DIAGNOSIS — Z7982 Long term (current) use of aspirin: Secondary | ICD-10-CM | POA: Diagnosis not present

## 2017-01-13 DIAGNOSIS — I252 Old myocardial infarction: Secondary | ICD-10-CM | POA: Insufficient documentation

## 2017-01-13 DIAGNOSIS — R404 Transient alteration of awareness: Secondary | ICD-10-CM | POA: Diagnosis not present

## 2017-01-13 DIAGNOSIS — Z7902 Long term (current) use of antithrombotics/antiplatelets: Secondary | ICD-10-CM | POA: Diagnosis not present

## 2017-01-13 DIAGNOSIS — M5124 Other intervertebral disc displacement, thoracic region: Secondary | ICD-10-CM | POA: Diagnosis not present

## 2017-01-13 LAB — SEDIMENTATION RATE: Sed Rate: 10 mm/hr (ref 0–16)

## 2017-01-13 LAB — CBC WITH DIFFERENTIAL/PLATELET
Basophils Absolute: 0 10*3/uL (ref 0.0–0.1)
Basophils Relative: 0 %
Eosinophils Absolute: 0.1 10*3/uL (ref 0.0–0.7)
Eosinophils Relative: 1 %
HCT: 44.6 % (ref 39.0–52.0)
Hemoglobin: 14.1 g/dL (ref 13.0–17.0)
Lymphocytes Relative: 19 %
Lymphs Abs: 1.3 10*3/uL (ref 0.7–4.0)
MCH: 27.3 pg (ref 26.0–34.0)
MCHC: 31.6 g/dL (ref 30.0–36.0)
MCV: 86.4 fL (ref 78.0–100.0)
Monocytes Absolute: 0.3 10*3/uL (ref 0.1–1.0)
Monocytes Relative: 4 %
Neutro Abs: 5.2 10*3/uL (ref 1.7–7.7)
Neutrophils Relative %: 76 %
Platelets: 188 10*3/uL (ref 150–400)
RBC: 5.16 MIL/uL (ref 4.22–5.81)
RDW: 13.9 % (ref 11.5–15.5)
WBC: 6.9 10*3/uL (ref 4.0–10.5)

## 2017-01-13 LAB — URINALYSIS, ROUTINE W REFLEX MICROSCOPIC
Bilirubin Urine: NEGATIVE
Glucose, UA: NEGATIVE mg/dL
Hgb urine dipstick: NEGATIVE
Ketones, ur: NEGATIVE mg/dL
Leukocytes, UA: NEGATIVE
Nitrite: NEGATIVE
Protein, ur: NEGATIVE mg/dL
Specific Gravity, Urine: 1.026 (ref 1.005–1.030)
pH: 5 (ref 5.0–8.0)

## 2017-01-13 LAB — COMPREHENSIVE METABOLIC PANEL
ALT: 15 U/L — ABNORMAL LOW (ref 17–63)
AST: 15 U/L (ref 15–41)
Albumin: 4 g/dL (ref 3.5–5.0)
Alkaline Phosphatase: 59 U/L (ref 38–126)
Anion gap: 8 (ref 5–15)
BUN: 20 mg/dL (ref 6–20)
CO2: 26 mmol/L (ref 22–32)
Calcium: 9.6 mg/dL (ref 8.9–10.3)
Chloride: 103 mmol/L (ref 101–111)
Creatinine, Ser: 1.34 mg/dL — ABNORMAL HIGH (ref 0.61–1.24)
GFR calc Af Amer: 60 mL/min — ABNORMAL LOW (ref >60)
GFR calc non Af Amer: 52 mL/min — ABNORMAL LOW (ref >60)
Glucose, Bld: 85 mg/dL (ref 65–99)
Potassium: 4.2 mmol/L (ref 3.5–5.1)
Sodium: 137 mmol/L (ref 135–145)
Total Bilirubin: 1.2 mg/dL (ref 0.3–1.2)
Total Protein: 6.7 g/dL (ref 6.5–8.1)

## 2017-01-13 LAB — C-REACTIVE PROTEIN: CRP: <0.8 mg/dL (ref <1.0)

## 2017-01-13 MED ORDER — GADOBENATE DIMEGLUMINE 529 MG/ML IV SOLN
20.0000 mL | Freq: Once | INTRAVENOUS | Status: AC
  Administered 2017-01-13: 20 mL via INTRAVENOUS

## 2017-01-13 MED ORDER — HYDROMORPHONE HCL 1 MG/ML IJ SOLN
1.0000 mg | Freq: Once | INTRAMUSCULAR | Status: AC
  Administered 2017-01-13: 1 mg via INTRAVENOUS
  Filled 2017-01-13: qty 1

## 2017-01-13 MED ORDER — ONDANSETRON HCL 4 MG/2ML IJ SOLN
4.0000 mg | Freq: Once | INTRAMUSCULAR | Status: AC
  Administered 2017-01-13: 4 mg via INTRAVENOUS
  Filled 2017-01-13: qty 2

## 2017-01-13 MED ORDER — OXYCODONE-ACETAMINOPHEN 5-325 MG PO TABS: 2.0000 | ORAL_TABLET | ORAL | 0 refills | Status: DC | PRN

## 2017-01-13 NOTE — ED Notes (Signed)
For updates on disposition, please call son. Number is listed in patient demographics.
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
## 2017-01-13 NOTE — ED Triage Notes (Signed)
Arrived via EMS from [facility]. Patient has a history of 4 spinal surgeries. Last surgery Feb and been having middle back pain since along with blood infection in May. Sent to ED for evaluation. Pain currently 10/10 achy sharp.

## 2017-01-13 NOTE — ED Notes (Signed)
Pts wife still upset "that he doesn't have an infection. I would rather him have an infection rather than have no answers!." this RN apologized to patient and family for lack of answers but encouraged to follow up with neurosurgery. Pt and family verbalized understanding. Pt placed into wheelchair and wheeled out of this ED by this RN. Again, this RN stated to patient and family, "i sincerely apologize, Good luck with everything." pt and wife left at pt drop off area to await for son to get the family care. pts wife thanked this RN "thanks, I know its not your fault."

## 2017-01-13 NOTE — ED Notes (Signed)
Pt has returned from MRI. 

## 2017-01-13 NOTE — ED Provider Notes (Signed)
Patient accepted signout from Dr. Oleta Mouse. Plan to follow-up with MRI results and neurosurgery. Patient was referred to the emergency department from neurosurgery for thoracic back pain. He does have prior history of sepsis and pneumonia with admission in June. At this time, patient's diagnostic evaluation is within normal limits. No white count, no elevation of sedimentation rate or CRP. MRI does not show acute findings. Reviewed with Joelene Millin of [facility]. At this time they do not have further recommendations. Will provide pain control with Percocet and have counseled to follow-up with PCP and neurosurgery.   Charlesetta Shanks, MD 01/13/17 [phone]

## 2017-01-13 NOTE — ED Notes (Signed)
ED Provider at bedside. 

## 2017-01-13 NOTE — ED Notes (Signed)
Pt given a urinal to provide urine sample. Pt encouraged to hit call bell when he has urinated.
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
## 2017-01-13 NOTE — ED Notes (Signed)
Patient transported to MRI 

## 2017-01-13 NOTE — ED Notes (Signed)
Pt family given graham crackers.

## 2017-01-13 NOTE — ED Notes (Signed)
Patient not in room. Wife and son at bedside waiting for patient.
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
## 2017-01-13 NOTE — ED Notes (Signed)
Patient wanting update, EDP notified

## 2017-01-18 LAB — CULTURE, BLOOD (ROUTINE X 2)
Culture: NO GROWTH
Culture: NO GROWTH
Special Requests: ADEQUATE
Special Requests: ADEQUATE

## 2017-01-19 ENCOUNTER — Other Ambulatory Visit (INDEPENDENT_AMBULATORY_CARE_PROVIDER_SITE_OTHER): Payer: Medicare Other

## 2017-01-19 DIAGNOSIS — E039 Hypothyroidism, unspecified: Secondary | ICD-10-CM

## 2017-01-19 DIAGNOSIS — E2749 Other adrenocortical insufficiency: Secondary | ICD-10-CM | POA: Diagnosis not present

## 2017-01-19 DIAGNOSIS — E230 Hypopituitarism: Secondary | ICD-10-CM | POA: Diagnosis not present

## 2017-01-19 LAB — BASIC METABOLIC PANEL
BUN: 34 mg/dL — ABNORMAL HIGH (ref 6–23)
CO2: 27 mEq/L (ref 19–32)
Calcium: 9.9 mg/dL (ref 8.4–10.5)
Chloride: 101 mEq/L (ref 96–112)
Creatinine, Ser: 2.28 mg/dL — ABNORMAL HIGH (ref 0.40–1.50)
GFR: 30.22 mL/min — ABNORMAL LOW (ref >60.00)
Glucose, Bld: 124 mg/dL — ABNORMAL HIGH (ref 70–99)
Potassium: 4.1 mEq/L (ref 3.5–5.1)
Sodium: 137 mEq/L (ref 135–145)

## 2017-01-19 LAB — T4, FREE: Free T4: 0.67 ng/dL (ref 0.60–1.60)

## 2017-01-19 LAB — TSH: TSH: 19.23 u[IU]/mL — ABNORMAL HIGH (ref 0.35–4.50)

## 2017-01-19 LAB — TESTOSTERONE: Testosterone: 111.41 ng/dL — ABNORMAL LOW (ref 300.00–890.00)

## 2017-01-20 DIAGNOSIS — M5124 Other intervertebral disc displacement, thoracic region: Secondary | ICD-10-CM | POA: Diagnosis not present

## 2017-01-23 ENCOUNTER — Ambulatory Visit (INDEPENDENT_AMBULATORY_CARE_PROVIDER_SITE_OTHER): Payer: Medicare Other | Admitting: Endocrinology

## 2017-01-23 ENCOUNTER — Ambulatory Visit (INDEPENDENT_AMBULATORY_CARE_PROVIDER_SITE_OTHER): Payer: Medicare Other | Admitting: *Deleted

## 2017-01-23 ENCOUNTER — Encounter: Payer: Self-pay | Admitting: Endocrinology

## 2017-01-23 VITALS — BP 126/80 | HR 69 | Ht 78.0 in | Wt 371.6 lb

## 2017-01-23 DIAGNOSIS — E2749 Other adrenocortical insufficiency: Secondary | ICD-10-CM | POA: Diagnosis not present

## 2017-01-23 DIAGNOSIS — E230 Hypopituitarism: Secondary | ICD-10-CM | POA: Diagnosis not present

## 2017-01-23 DIAGNOSIS — E039 Hypothyroidism, unspecified: Secondary | ICD-10-CM | POA: Diagnosis not present

## 2017-01-23 DIAGNOSIS — I639 Cerebral infarction, unspecified: Secondary | ICD-10-CM

## 2017-01-23 MED ORDER — TESTOSTERONE CYPIONATE 100 MG/ML IM SOLN: 150.0000 mg | INTRAMUSCULAR | 1 refills | Status: DC

## 2017-01-23 MED ORDER — LEVOTHYROXINE SODIUM 112 MCG PO TABS: 224.0000 ug | ORAL_TABLET | Freq: Every day | ORAL | 3 refills | Status: DC
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
## 2017-01-23 NOTE — Progress Notes (Signed)
Carelink Summary Report / Loop Recorder 

## 2017-01-24 ENCOUNTER — Telehealth: Payer: Self-pay

## 2017-01-24 DIAGNOSIS — N390 Urinary tract infection, site not specified: Secondary | ICD-10-CM | POA: Diagnosis not present

## 2017-01-24 DIAGNOSIS — R319 Hematuria, unspecified: Secondary | ICD-10-CM | POA: Diagnosis not present

## 2017-01-24 NOTE — Telephone Encounter (Signed)
Called patient and spoke with his son and he stated that another doctor gave patient his testosterone and it was for 200mg .

## 2017-01-24 NOTE — Telephone Encounter (Signed)
We can get him the injections as long as his medication is not expired.  He will take 150 mg every 2 weeks Recommend that he see Dr. Danise Mina at [facility]

## 2017-01-24 NOTE — Telephone Encounter (Signed)
Patient called in and he wants to know if we can give him testosterone injections. He has a bottle of serum and wanted to know if we can give him a shot? Or if he can inject himself? He stated that he has the .44ml and he has 5 of these vials and wanted to know if he can stop by today since he is in [place] this morning and wants [name] to call his son [phone] son is DJ, call if we can see today. Patient also wants a referral for a PCP, please refer a good one per patient.

## 2017-01-24 NOTE — Telephone Encounter (Signed)
Called patient and he is okay with giving the injections and he stated that he was instructed to give .75 every 2 weeks from his current PCP, just wanted to verify that you want him to take 2 of these to make the 150 every 2 weeks. He stated that he just received this medication this week. Please advise.  Also please put in a referral for Dr. Danise Mina at [facility] for him. He is very thankful for the help!!

## 2017-01-24 NOTE — Telephone Encounter (Signed)
We need to verify the dose as 150 mg for injection and please confirm that this is the prescription he picked up from the pharmacy yesterday

## 2017-01-25 ENCOUNTER — Other Ambulatory Visit: Payer: Self-pay | Admitting: Neurological Surgery

## 2017-01-25 DIAGNOSIS — M5124 Other intervertebral disc displacement, thoracic region: Secondary | ICD-10-CM

## 2017-01-25 NOTE — Telephone Encounter (Signed)
Please verify the doses when he comes in for injection

## 2017-01-27 ENCOUNTER — Ambulatory Visit
Admission: RE | Admit: 2017-01-27 | Discharge: 2017-01-27 | Disposition: A | Discharge status: Home / Self Care | Payer: Medicare Other | Source: Ambulatory Visit | Attending: Neurological Surgery | Admitting: Neurological Surgery

## 2017-01-27 DIAGNOSIS — M47812 Spondylosis without myelopathy or radiculopathy, cervical region: Secondary | ICD-10-CM | POA: Diagnosis not present

## 2017-01-27 DIAGNOSIS — M5124 Other intervertebral disc displacement, thoracic region: Secondary | ICD-10-CM

## 2017-01-27 MED ORDER — TRIAMCINOLONE ACETONIDE 40 MG/ML IJ SUSP (RADIOLOGY)
60.0000 mg | Freq: Once | INTRAMUSCULAR | Status: AC
  Administered 2017-01-27: 60 mg via EPIDURAL

## 2017-01-27 MED ORDER — IOPAMIDOL (ISOVUE-M 300) INJECTION 61%
1.0000 mL | Freq: Once | INTRAMUSCULAR | Status: AC | PRN
  Administered 2017-01-27: 1 mL via EPIDURAL

## 2017-01-27 NOTE — Discharge Instructions (Signed)

## 2017-01-30 DIAGNOSIS — N390 Urinary tract infection, site not specified: Secondary | ICD-10-CM | POA: Diagnosis not present

## 2017-01-30 DIAGNOSIS — R319 Hematuria, unspecified: Secondary | ICD-10-CM | POA: Diagnosis not present

## 2017-02-06 LAB — CUP PACEART REMOTE DEVICE CHECK
Date Time Interrogation Session: 20180715014536
Implantable Pulse Generator Implant Date: 20170719

## 2017-02-06 NOTE — Progress Notes (Signed)
Carelink summary report received. Battery status OK. Normal device function. No new symptom episodes, tachy episodes, brady, or pause episodes. No new AF episodes. Monthly summary reports and ROV/PRN 

## 2017-02-07 ENCOUNTER — Other Ambulatory Visit: Payer: Medicare Other

## 2017-02-08 ENCOUNTER — Encounter: Payer: Self-pay | Admitting: Neurology

## 2017-02-08 ENCOUNTER — Ambulatory Visit (INDEPENDENT_AMBULATORY_CARE_PROVIDER_SITE_OTHER): Payer: Medicare Other | Admitting: Neurology

## 2017-02-08 VITALS — BP 140/86 | HR 62 | Ht 79.0 in | Wt 388.2 lb

## 2017-02-08 DIAGNOSIS — R233 Spontaneous ecchymoses: Secondary | ICD-10-CM | POA: Diagnosis not present

## 2017-02-08 DIAGNOSIS — G458 Other transient cerebral ischemic attacks and related syndromes: Secondary | ICD-10-CM | POA: Diagnosis not present

## 2017-02-08 DIAGNOSIS — I639 Cerebral infarction, unspecified: Secondary | ICD-10-CM | POA: Diagnosis not present

## 2017-02-08 NOTE — Progress Notes (Signed)
[facility] [address] [place]. [place] [address] [phone]       OFFICE FOLLOW-UP NOTE  Mr. Dennis Schipani Sorto Sr. Date of Birth:  1946/01/14 Medical Record Number:  [id number]   HPI: Initial visit 04/18/16 : 17 year obese Caucasian male seen today for the first office follow-up visit following hospital admission for stroke in July 2017. The patient presented on 01/21/16 with sudden onset of right arm and leg weakness with the low end at stroke scale of 2. He was not considered a candidate for TPA. MRI scan of the brain was obtained which showed patchy feel right MCA as well as left MCA infarcts felt to be embolic etiology. Transthoracic echo was unremarkable. Carotid ultrasound showed no significant extra can stenosis. Irregular enhancement made for outpatient TEE and loop recorder. Patient presented 3 days later with worsening of symptoms and to. MRI scan showed multiple acute and subacute left hemispheric infarct compared with the previous MRI scan from 01/21/16 and MRI on 01/25/16 showed increase in size of left caudate infarct and new infarct in the anterior lateral margin of the left lentiform nucleus New Infarct in the left motor strip. The remaining infarcts unchanged. TEE showed no cardiac source of embolism and patient had loop recorder inserted. LDL cholesterol elevated at 149 mg percent and hemoglobin A1c was 5.8. Patient was started on Plavix for stroke prevention and advise aggressive risk factor modification. He was discharged home and states his done well and made full physical recovery from his stroke and has no deficit to his stamina is known to see him at times easily. The patient however has had new medical problems since discharge and has been found to have multiple endocrine dysfunction and sees Dr. Elayne Snare endocrinologist. He plans on getting a pituitary scan next week. He has been started on steroids and has skin 30-35 pounds weight. His tolerate Lipitor 80 mg  without side effects. Patient is also pulled a muscle in the last 1 week and is bothered by back pain. His blood pressure is elevated today at 154/99 the usually does much better. Update 02/08/2017 : He returns for follow-up after last visit with me in October 2017. He was admitted to the hospital on 11/15/16 with transient worsening of right-sided symptoms. MRI scan of the brain did not reveal any acute left-sided infarct but did show tiny punctate right parietal white matter diffusion positive lesion suspected to be a silent lacunar infarct. Patient subsequently was also seen in the emergency room at [facility] on 12/12/16 with tremors in the hands which were felt to be multifactorial secondary to renal failure and medication effect. He however has seen his endocrinologist Dr. Dwyane Dee who feels they're related to Addison's disease. The tremors are still present and interferes with activities like writing. He in fact has an appointment to see a physician at the [facility] in [place] to be evaluated for Parkinson's-like tremor related to exposure to toxins in service. Patient complains of significant bruising of his skin spontaneously seconded to being on dual antiplatelet therapy now for several months. He does not have any history of cardiac stents and this was started for intracranial atherosclerotic changes. He states his weight does fluctuate is trying to eat healthy and lose weight but he is at significant back pain just had epidural steroid injection at T10/T11 and is still in pain. ROS:   14 system review of systems is positive for  weight change, back pain tremors, back pain, walking difficulty  apnea, rash and all other systems negative  PMH:  Past Medical History:  Diagnosis Date  . Acute CVA (cerebrovascular accident) ([place]) 01/22/2016  . Addison disease ([place])   . Arthritis    low back - DDD  . Cancer [facility])    Testicular Cancer  . Cellulitis of scrotum   . Cellulitis, scrotum  08/02/2014  . CHF (congestive heart failure) ([place])   . Chronic lower back pain    "from [place] 2007"  . Complication of anesthesia    Sometimes has N&V /w anesth.   . Coronary artery disease    Cath 2001  . Elevated PSA   . Epididymitis, left 08/04/2014  . History of chronic bronchitis   . History of gout   . Hypertension   . Hypocholesteremia   . Hypothyroidism   . Kidney stone   . Myocardial infarction [facility]) 2001   2001- cardiac cath., cardiac clearanece note dr Otho Perl 05-14-13 on chart, stress test results 02-21-12 on chart  . OSA on CPAP    cpap setting of 10  . Pneumonia 2000's and 2013  . PONV (postoperative nausea and vomiting)   . [facility] spotted fever   . Stroke [facility]) 2004   "right brain stem; no residual "    Social History:  Social History   Social History  . Marital status: Married    Spouse name: N/A  . Number of children: 1  . Years of education: N/A   Occupational History  . Retired-works at [employer] History Main Topics  . Smoking status: Never Smoker  . Smokeless tobacco: Never Used  . Alcohol use Yes     Comment: " I drink wine about a year ago" 08/07/15  . Drug use: No  . Sexual activity: No   Other Topics Concern  . Not on file   Social History Narrative  . No narrative on file    Medications:   Current Outpatient Prescriptions on File Prior to Visit  Medication Sig Dispense Refill  . albuterol (PROVENTIL HFA;VENTOLIN HFA) 108 (90 Base) MCG/ACT inhaler Inhale 2 puffs into the lungs every 6 (six) hours as needed for wheezing or shortness of breath. 1 Inhaler 2  . allopurinol (ZYLOPRIM) 100 MG tablet Take 1 tablet (100 mg total) by mouth 2 (two) times daily. 30 tablet 12  . aspirin 325 MG tablet Take 1 tablet (325 mg total) by mouth daily. 30 tablet 2  . atorvastatin (LIPITOR) 80 MG tablet Take 1 tablet (80 mg total) by mouth daily. 30 tablet 12  . benzonatate (TESSALON) 200 MG capsule Take 1 capsule (200 mg total) by mouth 3 (three)  times daily as needed for cough. 20 capsule 0  . ciprofloxacin (CIPRO) 500 MG tablet Take 500 mg by mouth daily with breakfast.     . colchicine 0.6 MG tablet Take 1 tablet (0.6 mg total) by mouth 2 (two) times daily. 60 tablet 12  . EPINEPHrine 0.3 mg/0.3 mL IJ SOAJ injection Inject 0.3 mLs (0.3 mg total) into the muscle as needed (allergic reaction). 1 Device 12  . escitalopram (LEXAPRO) 20 MG tablet Take 1 tablet (20 mg total) by mouth daily. 30 tablet 0  . furosemide (LASIX) 20 MG tablet Take 3 tablets (60 mg total) by mouth daily. 30 tablet 1  . guaiFENesin-dextromethorphan (ROBITUSSIN DM) 100-10 MG/5ML syrup Take 5 mLs by mouth every 4 (four) hours as needed for cough. 118 mL 0  . hydrocortisone (CORTEF) 10 MG tablet Take  1 tablet (10 mg total) by mouth 2 (two) times daily. 60 tablet 12  . levothyroxine (SYNTHROID) 112 MCG tablet Take 2 tablets (224 mcg total) by mouth daily before breakfast. 60 tablet 3  . methocarbamol (ROBAXIN) 500 MG tablet Take 1 tablet (500 mg total) by mouth every 6 (six) hours as needed for muscle spasms. 60 tablet 1  . mometasone-formoterol (DULERA) 200-5 MCG/ACT AERO Inhale 2 puffs into the lungs 2 (two) times daily. 1 Inhaler 0  . oxyCODONE-acetaminophen (PERCOCET) 5-325 MG tablet Take 2 tablets by mouth every 4 (four) hours as needed. 20 tablet 0  . polyethylene glycol (MIRALAX / GLYCOLAX) packet Take 17 g by mouth daily as needed for mild constipation.     Marland Kitchen spironolactone (ALDACTONE) 100 MG tablet Take 100 mg by mouth daily.    Marland Kitchen testosterone (ANDRODERM) 4 MG/24HR PT24 patch 1 patch daily (Patient taking differently: Place 2 patches onto the skin daily. Place 2 patches, 1 on each side of body) 30 patch 2  . testosterone cypionate (DEPOTESTOTERONE CYPIONATE) 100 MG/ML injection Inject 1.5 mLs (150 mg total) into the muscle every 14 (fourteen) days. For IM use only 10 mL 1  . tiZANidine (ZANAFLEX) 4 MG tablet TK 1 T PO TID PRN  0   No current facility-administered  medications on file prior to visit.     Allergies:   Allergies  Allergen Reactions  . Bee Venom Anaphylaxis  . Shrimp [Shellfish Allergy] Anaphylaxis and Other (See Comments)    "just shrimp"  . Stadol [Butorphanol] Anaphylaxis and Other (See Comments)    respiratory  Distress, couldn't breathe, cardiac arrest  . Wasp Venom Anaphylaxis    Physical Exam General:obese middle aged caucasian male seated, in no evident distress Head: head normocephalic and atraumatic.  Neck: supple with no carotid or supraclavicular bruits Cardiovascular: regular rate and rhythm, no murmurs Musculoskeletal: no deformity Skin: Multiple bruising on the forearms Vascular:  Normal pulses all extremities Vitals:   02/08/17 1529  BP: 140/86  Pulse: 62   Neurologic Exam Mental Status: Awake and fully alert. Oriented to place and time. Recent and remote memory intact. Attention span, concentration and fund of knowledge appropriate. Mood and affect appropriate.  Cranial Nerves: Fundoscopic exam Not done. Pupils equal, briskly reactive to light. Extraocular movements full without nystagmus. Visual fields full to confrontation. Hearing intact. Facial sensation intact. Face, tongue, palate moves normally and symmetrically.  Motor: Normal bulk and tone. Normal strength in all tested extremity muscles. Intermittent fine action tremor of outstretched upper extremity is. No cogwheel rigidity or bradykinesia. Sensory.: intact to touch ,pinprick .position and vibratory sensation.  Coordination: Rapid alternating movements normal in all extremities. Finger-to-nose and heel-to-shin performed accurately bilaterally. Gait and Station: Arises from chair with  difficulty. Stance is slightly stooped due to back pain. Gait demonstrates broad base with splinting of back . Unable to heel, toe and tandem walk   Reflexes: 1+ and symmetric. Toes downgoing.   NIHSS  0 Modified Rankin  2   ASSESSMENT: 78 year Caucasian male with  the multiple embolic infarcts in July 2017 of cryptogenic etiology with multiple vascular risk factors of intracranial atherosclerosis, obesity, hypertension, recent left hemispheric TIA in May 2018 with silent tiny punctate right parietal white matter infarct from small vessel disease. hyperlipidemia    PLAN: I had a long discussion with the patient and her his son about his recent TIA, prior stroke, personally reviewed imaging films and answered questions about stroke prevention and treatment. I recommend  he discontinue Plavix and started on aspirin alone since it's been more than 3 months since he has been on dual antiplatelet therapy and he is getting a lot of skin bruising. Continue to maintain strict control of hypertension with blood pressure goal below 130/90, lipids with LDL cholesterol goal below 70 mg percent. I encouraged him to eat a healthy diet and to lose weight and exercise. He'll return for follow-up in 6 months or call earlier if necessary Greater than 50% of time during this 25 minute visit was spent on counseling,explanation of diagnosis, planning of further management, discussion with patient and family and coordination of care Antony Contras, MD  [facility] [address] [place], [place] [address]  Phone [phone] Fax [phone] Note: This document was prepared with digital dictation and possible smart phrase technology. Any transcriptional errors that result from this process are unintentional

## 2017-02-08 NOTE — Patient Instructions (Signed)
I had a long discussion with the patient and her his son about his recent TIA, prior stroke, personally reviewed imaging films and answered questions about stroke prevention and treatment. I recommend he discontinue Plavix and started on aspirin alone since it's been more than 3 months since he has been on dual antiplatelet therapy and he is getting a lot of skin bruising. Continue to maintain strict control of hypertension with blood pressure goal below 130/90, lipids with LDL cholesterol goal below 70 mg percent. I encouraged him to eat a healthy diet and to lose weight and exercise. He'll return for follow-up in 6 months or call earlier if necessary.

## 2017-02-09 ENCOUNTER — Ambulatory Visit (INDEPENDENT_AMBULATORY_CARE_PROVIDER_SITE_OTHER): Payer: Medicare Other

## 2017-02-09 ENCOUNTER — Other Ambulatory Visit: Payer: Self-pay

## 2017-02-09 DIAGNOSIS — E349 Endocrine disorder, unspecified: Secondary | ICD-10-CM | POA: Diagnosis not present

## 2017-02-09 MED ORDER — TESTOSTERONE CYPIONATE 200 MG/ML IM SOLN
200.0000 mg | INTRAMUSCULAR | Status: DC
  Administered 2017-02-09: 200 mg via INTRAMUSCULAR

## 2017-02-09 NOTE — Patient Outreach (Signed)
[facility]) Care Management  02/09/2017  Avyaan Summer Geisinger Sr. 1946/05/18 [id number]  EMMI: Engagement Referral date: 02/08/17 Referral source: Emmi engagement referral Referral reason: Referral score is 10   Telephone call to patient regarding EMMI engagement referral  HIPAA verified with patient. Discussed [facility] care management services with patient. Patient states he is in a program with his [employer]. Patient states he receives follow up calls from a nurse. Patient states he follows up closely with his doctors and he is going to follow up with an Agent [employer]. Patient states he also has a Engineer, site. Patient denies need for additional services at this time.  Patient agreed to received [facility] care management outreach letter and brochure.   PLAN: RNCM will refer patient to care management assistant to close due to refusal of services.  RNCM will notify patients primary MD of closure.   Quinn Plowman RN,BSN,CCM [facility] Telephonic  [phone]

## 2017-02-10 ENCOUNTER — Ambulatory Visit (INDEPENDENT_AMBULATORY_CARE_PROVIDER_SITE_OTHER): Payer: Medicare Other | Admitting: Physician Assistant

## 2017-02-10 ENCOUNTER — Encounter: Payer: Self-pay | Admitting: Physician Assistant

## 2017-02-10 VITALS — BP 150/80 | HR 61 | Temp 97.8°F | Resp 18

## 2017-02-10 DIAGNOSIS — I639 Cerebral infarction, unspecified: Secondary | ICD-10-CM

## 2017-02-10 DIAGNOSIS — Z8546 Personal history of malignant neoplasm of prostate: Secondary | ICD-10-CM | POA: Diagnosis not present

## 2017-02-10 DIAGNOSIS — G8929 Other chronic pain: Secondary | ICD-10-CM | POA: Diagnosis not present

## 2017-02-10 DIAGNOSIS — E230 Hypopituitarism: Secondary | ICD-10-CM | POA: Diagnosis not present

## 2017-02-10 DIAGNOSIS — E271 Primary adrenocortical insufficiency: Secondary | ICD-10-CM | POA: Diagnosis not present

## 2017-02-10 DIAGNOSIS — M549 Dorsalgia, unspecified: Secondary | ICD-10-CM | POA: Diagnosis not present

## 2017-02-10 DIAGNOSIS — Z981 Arthrodesis status: Secondary | ICD-10-CM

## 2017-02-10 DIAGNOSIS — Z9079 Acquired absence of other genital organ(s): Secondary | ICD-10-CM | POA: Diagnosis not present

## 2017-02-10 NOTE — Progress Notes (Signed)
Patient: Dennis Mazzuca Palma Sr. Male    DOB: Oct 16, 1945   71 y.o.   MRN: [id number] Visit Date: 02/10/2017  Today's Provider: Mar Daring, PA-C   Chief Complaint  Patient presents with  . New Patient (Initial Visit)   Subjective:    HPI Patient Dennis Gola Sr. Is here today to see [name] for the first time. Patient with h/o stroke in July 2017.  Patient was admitted to the hospital on 11/2016 with transient worsening of right-sided symptoms. Patient was also seen at [facility] on 12/22/16 with tremors in the hands which were felt to be multifactorial secondary to renal failure and medication effect. He saw Dr. Dwyane Dee who feels this is related to Addison's disease for which he was tested for and found to have during the hospitalization.   He also has low testosterone that was found by Dr. Dwyane Dee. He has used transdermal patches without response and is now on testosterone cypionate 200mg  every 14 days. He is wanting to establish his testosterone care here. He is having trouble driving every 2 weeks to [place]. He also has multiple appointments with specialist. He is established with a Dealer, Garment/textile technologist, Musician, Film/video editor, Paediatric nurse, Orthopedist and Neurosurgery. 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      Allergies  Allergen Reactions  . Bee Venom Anaphylaxis  . Shrimp [Shellfish Allergy] Anaphylaxis and Other (See Comments)    "just shrimp"  . Stadol [Butorphanol] Anaphylaxis and Other (See Comments)    respiratory  Distress, couldn't breathe, cardiac arrest  . Wasp Venom Anaphylaxis     Current Outpatient Prescriptions:  .  albuterol (PROVENTIL HFA;VENTOLIN HFA) 108 (90 Base) MCG/ACT inhaler,  Inhale 2 puffs into the lungs every 6 (six) hours as needed for wheezing or shortness of breath., Disp: 1 Inhaler, Rfl: 2 .  allopurinol (ZYLOPRIM) 100 MG tablet, Take 1 tablet (100 mg total) by mouth 2 (two) times daily., Disp: 30 tablet, Rfl: 12 .  aspirin 325 MG tablet, Take 1 tablet (325 mg total) by mouth daily., Disp: 30 tablet, Rfl: 2 .  atorvastatin (LIPITOR) 80 MG tablet, Take 1 tablet (80 mg total) by mouth daily., Disp: 30 tablet, Rfl: 12 .  benzonatate (TESSALON) 200 MG capsule, Take 1 capsule (200 mg total) by mouth 3 (three) times daily as needed for cough., Disp: 20 capsule, Rfl: 0 .  ciprofloxacin (CIPRO) 500 MG tablet, Take 500 mg by mouth daily with breakfast. , Disp: , Rfl:  .  colchicine 0.6 MG tablet, Take 1 tablet (0.6 mg total) by mouth 2 (two) times daily., Disp: 60 tablet, Rfl: 12 .  EPINEPHrine 0.3 mg/0.3 mL IJ SOAJ injection, Inject 0.3 mLs (0.3 mg total) into the muscle as needed (allergic reaction)., Disp: 1 Device, Rfl: 12 .  escitalopram (LEXAPRO) 20 MG tablet, Take 1 tablet (20 mg total) by mouth daily., Disp: 30 tablet, Rfl: 0 .  furosemide (LASIX) 20 MG tablet, Take 3 tablets (60 mg total) by mouth daily., Disp: 30 tablet, Rfl: 1 .  guaiFENesin-dextromethorphan (ROBITUSSIN DM) 100-10 MG/5ML syrup, Take 5 mLs by mouth every 4 (four) hours as needed for cough., Disp: 118 mL, Rfl: 0 .  hydrocortisone (CORTEF) 10 MG tablet,  Take 1 tablet (10 mg total) by mouth 2 (two) times daily., Disp: 60 tablet, Rfl: 12 .  levothyroxine (SYNTHROID) 112 MCG tablet, Take 2 tablets (224 mcg total) by mouth daily before breakfast., Disp: 60 tablet, Rfl: 3 .  losartan (COZAAR) 100 MG tablet, , Disp: , Rfl: 12 .  methocarbamol (ROBAXIN) 500 MG tablet, Take 1 tablet (500 mg total) by mouth every 6 (six) hours as needed for muscle spasms., Disp: 60 tablet, Rfl: 1 .  mometasone-formoterol (DULERA) 200-5 MCG/ACT AERO, Inhale 2 puffs into the lungs 2 (two) times daily., Disp: 1 Inhaler, Rfl: 0 .   oxyCODONE-acetaminophen (PERCOCET) 5-325 MG tablet, Take 2 tablets by mouth every 4 (four) hours as needed., Disp: 20 tablet, Rfl: 0 .  polyethylene glycol (MIRALAX / GLYCOLAX) packet, Take 17 g by mouth daily as needed for mild constipation. , Disp: , Rfl:  .  sildenafil (REVATIO) 20 MG tablet, Take 20 mg by mouth., Disp: , Rfl:  .  spironolactone (ALDACTONE) 100 MG tablet, Take 100 mg by mouth daily., Disp: , Rfl:  .  testosterone (ANDRODERM) 4 MG/24HR PT24 patch, 1 patch daily (Patient taking differently: Place 2 patches onto the skin daily. Place 2 patches, 1 on each side of body), Disp: 30 patch, Rfl: 2 .  testosterone cypionate (DEPOTESTOTERONE CYPIONATE) 100 MG/ML injection, Inject 1.5 mLs (150 mg total) into the muscle every 14 (fourteen) days. For IM use only, Disp: 10 mL, Rfl: 1 .  tiZANidine (ZANAFLEX) 4 MG tablet, TK 1 T PO TID PRN, Disp: , Rfl: 0 .  testosterone cypionate (DEPOTESTOSTERONE CYPIONATE) 200 MG/ML injection, INJ 0.75 ML IM Q 14 DAYS, Disp: , Rfl: 1  Current Facility-Administered Medications:  .  testosterone cypionate (DEPOTESTOSTERONE CYPIONATE) injection 200 mg, 200 mg, Intramuscular, Q14 Days, Elayne Snare, MD, 200 mg at 02/09/17 1039  Review of Systems  Constitutional: Negative.   Respiratory: Negative.   Cardiovascular: Negative.   Gastrointestinal: Negative.   Neurological: Negative.   No changes from chronic issues.  Social History  Substance Use Topics  . Smoking status: Never Smoker  . Smokeless tobacco: Never Used  . Alcohol use Yes     Comment: " I drink wine about a year ago" 08/07/15   Objective:   BP (!) 150/80 (BP Location: Right Arm, Patient Position: Lying right side, Cuff Size: Small)   Pulse 61   Temp 97.8 F (36.6 C) (Oral)   Resp 18    Physical Exam  Constitutional: He appears well-developed and well-nourished. No distress.  HENT:  Head: Normocephalic and atraumatic.  Neck: Normal range of motion. Neck supple. No tracheal deviation  present. No thyromegaly present.  Cardiovascular: Normal rate, regular rhythm and normal heart sounds.  Exam reveals no gallop and no friction rub.   No murmur heard. Pulmonary/Chest: Effort normal and breath sounds normal. No respiratory distress. He has no wheezes. He has no rales.  Musculoskeletal: He exhibits edema.  Lymphadenopathy:    He has no cervical adenopathy.  Skin: He is not diaphoretic.  Venous stasis discoloration noted on lower extremities  Psychiatric: He has a normal mood and affect. His behavior is normal. Judgment and thought content normal.  Vitals reviewed.       Assessment & Plan:     1. Hypogonadotropic hypogonadism ([place]) He is going to f/u with Dr. Dwyane Dee on this at the end of the month and have his labs rechecked to see how the injection is working and if labs are improving. Dr. Dwyane Dee is  to send notes to [name] following this visit. If he is willing we will take over monitoring labs for hypogonadism and start his testosterone injections here to save him from driving to [place]. We will have to take over this treatment completely before it can be done in our office.   2. Addison's disease ([place]) Followed by Endocrinology, Dr. Dwyane Dee.   3. S/P lumbar spinal fusion Stable. Followed by Neurosurgery.  4. Chronic back pain, unspecified back location, unspecified back pain laterality Stable. Followed by Neurosurgery.   5. Morbid obesity ([place]) Counseled patient on healthy lifestyle modifications including dieting and exercise.   6. History of prostate cancer Followed by Dr. Thomasene Mohair with [facility] in [facility].  7. S/P prostatectomy Followed by Dr. Thomasene Mohair with [facility] in [facility].       Mar Daring, PA-C  [facility]

## 2017-02-13 DIAGNOSIS — M7071 Other bursitis of hip, right hip: Secondary | ICD-10-CM | POA: Diagnosis not present

## 2017-02-13 DIAGNOSIS — I10 Essential (primary) hypertension: Secondary | ICD-10-CM | POA: Diagnosis not present

## 2017-02-13 DIAGNOSIS — Z6841 Body Mass Index (BMI) 40.0 and over, adult: Secondary | ICD-10-CM | POA: Diagnosis not present

## 2017-02-17 DIAGNOSIS — M7061 Trochanteric bursitis, right hip: Secondary | ICD-10-CM | POA: Diagnosis not present

## 2017-02-20 ENCOUNTER — Ambulatory Visit (INDEPENDENT_AMBULATORY_CARE_PROVIDER_SITE_OTHER): Payer: Medicare Other | Admitting: *Deleted

## 2017-02-20 DIAGNOSIS — I639 Cerebral infarction, unspecified: Secondary | ICD-10-CM

## 2017-02-22 LAB — LIPID PANEL
Cholesterol: 121 (ref 0–200)
HDL: 41 (ref 35–70)
LDL Cholesterol: 63
Triglycerides: 83 (ref 40–160)

## 2017-02-22 LAB — HEPATIC FUNCTION PANEL
ALT: 28 (ref 10–40)
AST: 10 — AB (ref 14–40)
Alkaline Phosphatase: 65 (ref 25–125)
Bilirubin, Total: 1.2

## 2017-02-22 LAB — HEMOGLOBIN A1C: Hemoglobin A1C: 5.4

## 2017-02-22 LAB — BASIC METABOLIC PANEL
BUN: 25 — AB (ref 4–21)
Creatinine: 1.3 (ref 0.6–1.3)
Glucose: 85
Potassium: 4.2 (ref 3.4–5.3)
Sodium: 139 (ref 137–147)

## 2017-02-22 LAB — CBC AND DIFFERENTIAL
HCT: 46 (ref 41–53)
Hemoglobin: 15.1 (ref 13.5–17.5)
Neutrophils Absolute: 8
Platelets: 253 (ref 150–399)
WBC: 10.4

## 2017-02-22 LAB — PSA: PSA: 1.22

## 2017-02-22 LAB — TSH: TSH: 0.63 (ref 0.41–5.90)

## 2017-02-23 ENCOUNTER — Ambulatory Visit (INDEPENDENT_AMBULATORY_CARE_PROVIDER_SITE_OTHER): Payer: Medicare Other

## 2017-02-23 DIAGNOSIS — E349 Endocrine disorder, unspecified: Secondary | ICD-10-CM

## 2017-02-23 MED ORDER — TESTOSTERONE CYPIONATE 200 MG/ML IM SOLN
200.0000 mg | INTRAMUSCULAR | Status: DC
  Administered 2017-02-23 – 2017-03-23 (×2): 200 mg via INTRAMUSCULAR

## 2017-02-25 LAB — CUP PACEART REMOTE DEVICE CHECK
Date Time Interrogation Session: 20180814013740
Implantable Pulse Generator Implant Date: 20170719

## 2017-02-25 NOTE — Progress Notes (Signed)
Carelink summary report received. Battery status OK. Normal device function. No new symptom episodes, tachy episodes, brady, or pause episodes. No new AF episodes. Monthly summary reports and ROV/PRN 

## 2017-03-02 ENCOUNTER — Other Ambulatory Visit (INDEPENDENT_AMBULATORY_CARE_PROVIDER_SITE_OTHER): Payer: Medicare Other

## 2017-03-02 DIAGNOSIS — E039 Hypothyroidism, unspecified: Secondary | ICD-10-CM

## 2017-03-02 DIAGNOSIS — E230 Hypopituitarism: Secondary | ICD-10-CM | POA: Diagnosis not present

## 2017-03-02 LAB — T4, FREE: Free T4: 1.69 ng/dL — ABNORMAL HIGH (ref 0.60–1.60)

## 2017-03-02 LAB — TSH
TSH: 0.22 u[IU]/mL — ABNORMAL LOW (ref 0.35–4.50)
TSH: 0.22 — AB (ref <=5.90)

## 2017-03-02 LAB — TESTOSTERONE: Testosterone: 351.02 ng/dL (ref 300.00–890.00)

## 2017-03-06 ENCOUNTER — Ambulatory Visit (INDEPENDENT_AMBULATORY_CARE_PROVIDER_SITE_OTHER): Payer: Medicare Other | Admitting: Endocrinology

## 2017-03-06 ENCOUNTER — Other Ambulatory Visit: Payer: Self-pay

## 2017-03-06 ENCOUNTER — Encounter: Payer: Self-pay | Admitting: Endocrinology

## 2017-03-06 VITALS — BP 118/60 | HR 82 | Ht 79.0 in | Wt 380.8 lb

## 2017-03-06 DIAGNOSIS — I639 Cerebral infarction, unspecified: Secondary | ICD-10-CM | POA: Diagnosis not present

## 2017-03-06 DIAGNOSIS — E230 Hypopituitarism: Secondary | ICD-10-CM

## 2017-03-06 DIAGNOSIS — E2740 Unspecified adrenocortical insufficiency: Secondary | ICD-10-CM | POA: Diagnosis not present

## 2017-03-06 DIAGNOSIS — E349 Endocrine disorder, unspecified: Secondary | ICD-10-CM

## 2017-03-06 MED ORDER — TESTOSTERONE CYPIONATE 200 MG/ML IM SOLN
200.0000 mg | INTRAMUSCULAR | Status: DC
  Administered 2017-03-06: 200 mg via INTRAMUSCULAR

## 2017-03-06 MED ORDER — TESTOSTERONE CYPIONATE 200 MG/ML IM SOLN: INTRAMUSCULAR | 1 refills | Status: DC
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
## 2017-03-06 NOTE — Patient Instructions (Addendum)
Take thyroid pills 6 days per week

## 2017-03-07 ENCOUNTER — Ambulatory Visit (INDEPENDENT_AMBULATORY_CARE_PROVIDER_SITE_OTHER): Payer: Medicare Other | Admitting: Family Medicine

## 2017-03-07 VITALS — BP 144/90 | HR 70 | Temp 98.4°F | Resp 18 | Wt 385.0 lb

## 2017-03-07 DIAGNOSIS — Z9989 Dependence on other enabling machines and devices: Secondary | ICD-10-CM | POA: Diagnosis not present

## 2017-03-07 DIAGNOSIS — N183 Chronic kidney disease, stage 3 unspecified: Secondary | ICD-10-CM

## 2017-03-07 DIAGNOSIS — E271 Primary adrenocortical insufficiency: Secondary | ICD-10-CM

## 2017-03-07 DIAGNOSIS — Z23 Encounter for immunization: Secondary | ICD-10-CM | POA: Diagnosis not present

## 2017-03-07 DIAGNOSIS — J302 Other seasonal allergic rhinitis: Secondary | ICD-10-CM

## 2017-03-07 DIAGNOSIS — I6340 Cerebral infarction due to embolism of unspecified cerebral artery: Secondary | ICD-10-CM

## 2017-03-07 DIAGNOSIS — G4733 Obstructive sleep apnea (adult) (pediatric): Secondary | ICD-10-CM | POA: Diagnosis not present

## 2017-03-07 DIAGNOSIS — E230 Hypopituitarism: Secondary | ICD-10-CM | POA: Diagnosis not present

## 2017-03-07 DIAGNOSIS — I639 Cerebral infarction, unspecified: Secondary | ICD-10-CM

## 2017-03-07 MED ORDER — LORATADINE 10 MG PO TABS: 10.0000 mg | ORAL_TABLET | Freq: Every day | ORAL | 11 refills | Status: DC

## 2017-03-07 NOTE — Progress Notes (Signed)
Dennis Nations Cicio Sr.  MRN: [id number] DOB: 05-Dec-1945  Subjective:  HPI  Patient is here to discuss a few issues, and updates since his last visit with Dennis [name]. Patient states he was diagnosed with CKD stage 3 and was told to follow up with PCP on this issue. He still sees endocrinologist in [place] for follow up on Addison's disease and lab work. Also testosterone injections have been done there. He has to go to [facility] every 2 weeks for the injection. He has the medication and would like to re discuss if he can come and get this done here.  Patient has also been seen at [facility] and brought results of recent lab work through them and chest xray. Patient states he has had 3 more strokes since he saw you. Wt Readings from Last 3 Encounters:  03/07/17 (!) 385 lb (174.6 kg)  03/06/17 (!) 380 lb 12.8 oz (172.7 kg)  02/08/17 (!) 388 lb 3.2 oz (176.1 kg)   BP Readings from Last 3 Encounters:  03/07/17 (!) 144/90  03/06/17 118/60  02/10/17 (!) 150/80   Patient is using CPAP every night. Tolerating it well. Patient Active Problem List   Diagnosis Date Noted  . History of prostate cancer 02/10/2017  . S/P prostatectomy 02/10/2017  . Spontaneous bruising 02/08/2017  . History of sepsis 12/11/2016  . History of pneumonia 12/11/2016  . Acute on chronic kidney failure ([place]) 12/11/2016  . History of stroke   . Ischemic stroke ([place]) 11/15/2016  . Localized swelling of lower extremity 11/15/2016  . History of cellulitis 11/15/2016  . Addison's disease ([place]) 11/15/2016  . Hypogonadotropic hypogonadism ([place]) 06/09/2016  . Adrenal insufficiency ([place]) 03/18/2016  . Vertigo 03/17/2016  . AKI (acute kidney injury) ([place])   . Stroke (cerebrum) ([place])   . Essential hypertension   . Hypotension due to drugs   . Palpitations   . Acquired hypothyroidism 11/04/2015  . Arthritis 11/04/2015  . Decreased libido 11/04/2015  . Narrowing of intervertebral disc space 11/04/2015  . Major depressive disorder,  single episode, mild ([place]) 11/04/2015  . H/o Lyme disease 11/04/2015  . Hypercholesterolemia 11/04/2015  . Morbid obesity ([place]) 11/04/2015  . Temporary cerebral vascular dysfunction 11/04/2015  . Chronic tophaceous gout 09/21/2015  . Blood glucose elevated 06/02/2015  . OSA on CPAP 08/03/2014  . Chronic diastolic CHF (congestive heart failure) ([place]) 08/03/2014  . CAD (coronary artery disease) 08/02/2014  . Bursitis, trochanteric 05/15/2014  . S/P lumbar spinal fusion 07/17/2013  . Chronic back pain     Past Medical History:  Diagnosis Date  . Acute CVA (cerebrovascular accident) ([place]) 01/22/2016  . Addison disease ([place])   . Arthritis    low back - DDD  . Cancer [facility])    Testicular Cancer  . Cellulitis of scrotum   . Cellulitis, scrotum 08/02/2014  . CHF (congestive heart failure) ([place])   . Chronic lower back pain    "from [place] 2007"  . Complication of anesthesia    Sometimes has N&V /w anesth.   . Coronary artery disease    Cath 2001  . Elevated PSA   . Epididymitis, left 08/04/2014  . History of chronic bronchitis   . History of gout   . Hypertension   . Hypocholesteremia   . Hypothyroidism   . Kidney stone   . Myocardial infarction [facility]) 2001   2001- cardiac cath., cardiac clearanece note dr Otho Perl 05-14-13 on chart, stress test results 02-21-12 on chart  . OSA on CPAP  cpap setting of 10  . Pneumonia 2000's and 2013  . PONV (postoperative nausea and vomiting)   . [facility] spotted fever   . Stroke [facility]) 2004   "right brain stem; no residual "    Social History   Social History  . Marital status: Married    Spouse name: N/A  . Number of children: 1  . Years of education: N/A   Occupational History  . Retired-works at [employer] History Main Topics  . Smoking status: Never Smoker  . Smokeless tobacco: Never Used  . Alcohol use Yes     Comment: " I drink wine about a year ago" 08/07/15  . Drug use: No  . Sexual activity: No   Other Topics  Concern  . Not on file   Social History Narrative  . No narrative on file    Outpatient Encounter Prescriptions as of 03/07/2017  Medication Sig Note  . albuterol (PROVENTIL HFA;VENTOLIN HFA) 108 (90 Base) MCG/ACT inhaler Inhale 2 puffs into the lungs every 6 (six) hours as needed for wheezing or shortness of breath.   . allopurinol (ZYLOPRIM) 100 MG tablet Take 1 tablet (100 mg total) by mouth 2 (two) times daily.   Marland Kitchen aspirin 325 MG tablet Take 1 tablet (325 mg total) by mouth daily.   Marland Kitchen atorvastatin (LIPITOR) 80 MG tablet Take 1 tablet (80 mg total) by mouth daily.   . benzonatate (TESSALON) 200 MG capsule Take 1 capsule (200 mg total) by mouth 3 (three) times daily as needed for cough.   . ciprofloxacin (CIPRO) 500 MG tablet Take 500 mg by mouth daily with breakfast.    . colchicine 0.6 MG tablet Take 1 tablet (0.6 mg total) by mouth 2 (two) times daily.   Marland Kitchen EPINEPHrine 0.3 mg/0.3 mL IJ SOAJ injection Inject 0.3 mLs (0.3 mg total) into the muscle as needed (allergic reaction).   Marland Kitchen escitalopram (LEXAPRO) 20 MG tablet Take 1 tablet (20 mg total) by mouth daily.   . furosemide (LASIX) 20 MG tablet Take 3 tablets (60 mg total) by mouth daily. 12/10/2016: Sometimes pt takes 4 depending on how swollen he is   . guaiFENesin-dextromethorphan (ROBITUSSIN DM) 100-10 MG/5ML syrup Take 5 mLs by mouth every 4 (four) hours as needed for cough.   . hydrocortisone (CORTEF) 10 MG tablet Take 1 tablet (10 mg total) by mouth 2 (two) times daily.   Marland Kitchen levothyroxine (SYNTHROID) 112 MCG tablet Take 2 tablets (224 mcg total) by mouth daily before breakfast.   . losartan (COZAAR) 100 MG tablet    . methocarbamol (ROBAXIN) 500 MG tablet Take 1 tablet (500 mg total) by mouth every 6 (six) hours as needed for muscle spasms.   . mometasone-formoterol (DULERA) 200-5 MCG/ACT AERO Inhale 2 puffs into the lungs 2 (two) times daily.   Marland Kitchen oxyCODONE-acetaminophen (PERCOCET) 5-325 MG tablet Take 2 tablets by mouth every 4 (four)  hours as needed.   . polyethylene glycol (MIRALAX / GLYCOLAX) packet Take 17 g by mouth daily as needed for mild constipation.    . sildenafil (REVATIO) 20 MG tablet Take 20 mg by mouth.   . spironolactone (ALDACTONE) 100 MG tablet Take 100 mg by mouth daily.   Marland Kitchen testosterone cypionate (DEPO-TESTOSTERONE) 200 MG/ML injection Inject 0.75 ML IN THE MUSCLE EVERY 14 DAYS   . testosterone cypionate (DEPOTESTOSTERONE CYPIONATE) 200 MG/ML injection INJ 0.75 ML IM Q 14 DAYS   . testosterone cypionate (DEPOTESTOTERONE CYPIONATE) 100 MG/ML injection Inject 1.5  mLs (150 mg total) into the muscle every 14 (fourteen) days. For IM use only   . tiZANidine (ZANAFLEX) 4 MG tablet TK 1 T PO TID PRN    Facility-Administered Encounter Medications as of 03/07/2017  Medication  . testosterone cypionate (DEPOTESTOSTERONE CYPIONATE) injection 200 mg  . testosterone cypionate (DEPOTESTOSTERONE CYPIONATE) injection 200 mg  . testosterone cypionate (DEPOTESTOSTERONE CYPIONATE) injection 200 mg    Allergies  Allergen Reactions  . Bee Venom Anaphylaxis  . Shrimp [Shellfish Allergy] Anaphylaxis and Other (See Comments)    "just shrimp"  . Stadol [Butorphanol] Anaphylaxis and Other (See Comments)    respiratory  Distress, couldn't breathe, cardiac arrest  . Wasp Venom Anaphylaxis    Review of Systems  Constitutional: Positive for malaise/fatigue.  HENT:       Sneezing and post nasal drip  Respiratory: Positive for shortness of breath.   Cardiovascular: Negative.   Gastrointestinal: Negative.   Genitourinary:       Urinary incontinence.  Musculoskeletal: Positive for back pain and joint pain.       Unsteady on his feet.  Neurological: Positive for tremors and weakness.    Objective:  BP (!) 144/90   Pulse 70   Temp 98.4 F (36.9 C)   Resp 18   Wt (!) 385 lb (174.6 kg)   SpO2 100%   BMI 43.37 kg/m   Physical Exam  Assessment and Plan :  1. Hypogonadotropic hypogonadism (HCC) Gets Testosterone  injections. We can start to give him these shots next month.  2. Addison's disease [facility]) Follows endocrinologist.  3. Cerebrovascular accident (CVA) due to embolism of cerebral artery ([place])  4. OSA on CPAP Uses CPAP at night.  5. CKD (chronic kidney disease) stage 3, GFR 30-59 ml/min New. - Protein Electrophoresis, (serum)  6. Seasonal allergic rhinitis, unspecified trigger Try Claritin. - loratadine (CLARITIN) 10 MG tablet; Take 1 tablet (10 mg total) by mouth daily.  Dispense: 30 tablet; Refill: 11  7. Need for influenza vaccination - Flu vaccine HIGH DOSE PF 8.Hand Tremor--L>R Neurology referral in future. HPI, Exam and A&P transcribed by Theressa Millard, RMA under direction and in the presence of Miguel Aschoff, MD. I have done the exam and reviewed the chart and it is accurate to the best of my knowledge. Development worker, community has been used and  any errors in dictation or transcription are unintentional. Miguel Aschoff M.D. [facility]

## 2017-03-08 DIAGNOSIS — N390 Urinary tract infection, site not specified: Secondary | ICD-10-CM | POA: Diagnosis not present

## 2017-03-15 ENCOUNTER — Ambulatory Visit: Payer: Medicare Other | Admitting: Cardiovascular Disease

## 2017-03-22 ENCOUNTER — Ambulatory Visit (INDEPENDENT_AMBULATORY_CARE_PROVIDER_SITE_OTHER): Payer: Medicare Other | Admitting: *Deleted

## 2017-03-22 ENCOUNTER — Telehealth: Payer: Self-pay | Admitting: Cardiology

## 2017-03-22 DIAGNOSIS — I639 Cerebral infarction, unspecified: Secondary | ICD-10-CM | POA: Diagnosis not present

## 2017-03-22 NOTE — Telephone Encounter (Signed)
Spoke w/ pt and requested that he send a manual transmission b/c his home monitor has not updated in at least 14 days.   

## 2017-03-23 ENCOUNTER — Ambulatory Visit (INDEPENDENT_AMBULATORY_CARE_PROVIDER_SITE_OTHER): Payer: Medicare Other

## 2017-03-23 DIAGNOSIS — E349 Endocrine disorder, unspecified: Secondary | ICD-10-CM | POA: Diagnosis not present

## 2017-03-23 NOTE — Progress Notes (Addendum)
Patient comes in for testosterone injection. Injected into right ventrogluteal. Patient tolerated injection well. No complaints of pain  Approved   KUMAR,AJAY

## 2017-03-23 NOTE — Progress Notes (Signed)
Carelink Summary Report / Loop Recorder 

## 2017-03-27 LAB — CUP PACEART REMOTE DEVICE CHECK
Date Time Interrogation Session: 20180913020835
Implantable Pulse Generator Implant Date: 20170719

## 2017-03-29 ENCOUNTER — Other Ambulatory Visit: Payer: Self-pay | Admitting: Family Medicine

## 2017-03-30 DIAGNOSIS — N183 Chronic kidney disease, stage 3 (moderate): Secondary | ICD-10-CM | POA: Diagnosis not present

## 2017-04-03 ENCOUNTER — Ambulatory Visit (INDEPENDENT_AMBULATORY_CARE_PROVIDER_SITE_OTHER): Payer: Medicare Other | Admitting: Family Medicine

## 2017-04-03 VITALS — BP 128/86 | HR 100 | Temp 98.3°F | Resp 20 | Wt 380.4 lb

## 2017-04-03 DIAGNOSIS — E271 Primary adrenocortical insufficiency: Secondary | ICD-10-CM | POA: Diagnosis not present

## 2017-04-03 DIAGNOSIS — Z9989 Dependence on other enabling machines and devices: Secondary | ICD-10-CM

## 2017-04-03 DIAGNOSIS — N183 Chronic kidney disease, stage 3 unspecified: Secondary | ICD-10-CM

## 2017-04-03 DIAGNOSIS — G8929 Other chronic pain: Secondary | ICD-10-CM | POA: Diagnosis not present

## 2017-04-03 DIAGNOSIS — I639 Cerebral infarction, unspecified: Secondary | ICD-10-CM

## 2017-04-03 DIAGNOSIS — Z981 Arthrodesis status: Secondary | ICD-10-CM | POA: Diagnosis not present

## 2017-04-03 DIAGNOSIS — M5442 Lumbago with sciatica, left side: Secondary | ICD-10-CM

## 2017-04-03 DIAGNOSIS — G4733 Obstructive sleep apnea (adult) (pediatric): Secondary | ICD-10-CM

## 2017-04-03 DIAGNOSIS — E230 Hypopituitarism: Secondary | ICD-10-CM

## 2017-04-03 DIAGNOSIS — Z6841 Body Mass Index (BMI) 40.0 and over, adult: Secondary | ICD-10-CM | POA: Diagnosis not present

## 2017-04-03 DIAGNOSIS — M5441 Lumbago with sciatica, right side: Secondary | ICD-10-CM
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
## 2017-04-04 LAB — PROTEIN ELECTROPHORESIS, SERUM
A/G Ratio: 1 (ref 0.7–1.7)
Albumin ELP: 3.1 g/dL (ref 2.9–4.4)
Alpha 1: 0.2 g/dL (ref 0.0–0.4)
Alpha 2: 0.8 g/dL (ref 0.4–1.0)
Beta: 0.9 g/dL (ref 0.7–1.3)
Gamma Globulin: 1.1 g/dL (ref 0.4–1.8)
Globulin, Total: 3.1 g/dL (ref 2.2–3.9)
Total Protein: 6.2 g/dL (ref 6.0–8.5)

## 2017-04-06 ENCOUNTER — Ambulatory Visit: Payer: Medicare Other | Admitting: Family Medicine

## 2017-04-06 ENCOUNTER — Ambulatory Visit (INDEPENDENT_AMBULATORY_CARE_PROVIDER_SITE_OTHER): Payer: Medicare Other

## 2017-04-06 DIAGNOSIS — E230 Hypopituitarism: Secondary | ICD-10-CM | POA: Diagnosis not present

## 2017-04-06 MED ORDER — TESTOSTERONE CYPIONATE 200 MG/ML IM SOLN
150.0000 mg | Freq: Once | INTRAMUSCULAR | Status: AC
  Administered 2017-04-06: 150 mg via INTRAMUSCULAR

## 2017-04-10 ENCOUNTER — Ambulatory Visit: Payer: Medicare Other | Admitting: Physician Assistant

## 2017-04-15 ENCOUNTER — Other Ambulatory Visit: Payer: Self-pay | Admitting: Family Medicine

## 2017-04-20 ENCOUNTER — Ambulatory Visit: Payer: Medicare Other | Admitting: Neurology

## 2017-04-21 ENCOUNTER — Ambulatory Visit (INDEPENDENT_AMBULATORY_CARE_PROVIDER_SITE_OTHER): Payer: Medicare Other | Admitting: Physician Assistant

## 2017-04-21 ENCOUNTER — Ambulatory Visit (INDEPENDENT_AMBULATORY_CARE_PROVIDER_SITE_OTHER): Payer: Medicare Other | Admitting: *Deleted

## 2017-04-21 DIAGNOSIS — R1030 Lower abdominal pain, unspecified: Secondary | ICD-10-CM | POA: Diagnosis not present

## 2017-04-21 DIAGNOSIS — I639 Cerebral infarction, unspecified: Secondary | ICD-10-CM | POA: Diagnosis not present

## 2017-04-21 DIAGNOSIS — R161 Splenomegaly, not elsewhere classified: Secondary | ICD-10-CM | POA: Diagnosis not present

## 2017-04-21 DIAGNOSIS — E230 Hypopituitarism: Secondary | ICD-10-CM

## 2017-04-21 DIAGNOSIS — Z79899 Other long term (current) drug therapy: Secondary | ICD-10-CM | POA: Diagnosis not present

## 2017-04-21 DIAGNOSIS — N3289 Other specified disorders of bladder: Secondary | ICD-10-CM | POA: Diagnosis not present

## 2017-04-21 DIAGNOSIS — R1032 Left lower quadrant pain: Secondary | ICD-10-CM | POA: Diagnosis not present

## 2017-04-21 DIAGNOSIS — R82998 Other abnormal findings in urine: Secondary | ICD-10-CM | POA: Diagnosis not present

## 2017-04-21 DIAGNOSIS — I2510 Atherosclerotic heart disease of native coronary artery without angina pectoris: Secondary | ICD-10-CM | POA: Diagnosis not present

## 2017-04-21 DIAGNOSIS — I10 Essential (primary) hypertension: Secondary | ICD-10-CM | POA: Diagnosis not present

## 2017-04-21 DIAGNOSIS — R1031 Right lower quadrant pain: Secondary | ICD-10-CM | POA: Diagnosis not present

## 2017-04-21 DIAGNOSIS — R5383 Other fatigue: Secondary | ICD-10-CM | POA: Diagnosis not present

## 2017-04-21 DIAGNOSIS — R1084 Generalized abdominal pain: Secondary | ICD-10-CM | POA: Diagnosis not present

## 2017-04-21 MED ORDER — TESTOSTERONE CYPIONATE 200 MG/ML IM SOLN
200.0000 mg | INTRAMUSCULAR | Status: DC
  Administered 2017-04-21: 200 mg via INTRAMUSCULAR
  Administered 2017-05-04: 150 mg via INTRAMUSCULAR
  Administered 2017-05-19 – 2017-05-31 (×2): 200 mg via INTRAMUSCULAR

## 2017-04-21 NOTE — Progress Notes (Signed)
Testosterone injection given to patient today in the office. He is to return in 2 weeks for next injection. He supplied testosterone.

## 2017-04-24 NOTE — Progress Notes (Signed)
Carelink Summary Report / Loop Recorder 

## 2017-04-25 DIAGNOSIS — C61 Malignant neoplasm of prostate: Secondary | ICD-10-CM | POA: Diagnosis not present

## 2017-04-25 DIAGNOSIS — N401 Enlarged prostate with lower urinary tract symptoms: Secondary | ICD-10-CM | POA: Diagnosis not present

## 2017-04-25 DIAGNOSIS — R3912 Poor urinary stream: Secondary | ICD-10-CM | POA: Diagnosis not present

## 2017-04-25 DIAGNOSIS — Z9079 Acquired absence of other genital organ(s): Secondary | ICD-10-CM | POA: Diagnosis not present

## 2017-04-25 DIAGNOSIS — N529 Male erectile dysfunction, unspecified: Secondary | ICD-10-CM | POA: Diagnosis not present

## 2017-04-25 DIAGNOSIS — R109 Unspecified abdominal pain: Secondary | ICD-10-CM | POA: Diagnosis not present

## 2017-04-25 DIAGNOSIS — R7989 Other specified abnormal findings of blood chemistry: Secondary | ICD-10-CM | POA: Diagnosis not present

## 2017-04-25 LAB — CUP PACEART REMOTE DEVICE CHECK
Date Time Interrogation Session: 20181013021250
Implantable Pulse Generator Implant Date: 20170719

## 2017-04-27 ENCOUNTER — Ambulatory Visit (INDEPENDENT_AMBULATORY_CARE_PROVIDER_SITE_OTHER): Payer: Medicare Other | Admitting: Cardiovascular Disease

## 2017-04-27 ENCOUNTER — Encounter: Payer: Self-pay | Admitting: Cardiovascular Disease

## 2017-04-27 VITALS — BP 120/84 | HR 76 | Ht 79.0 in | Wt 379.4 lb

## 2017-04-27 DIAGNOSIS — I639 Cerebral infarction, unspecified: Secondary | ICD-10-CM | POA: Diagnosis not present

## 2017-04-27 DIAGNOSIS — I2510 Atherosclerotic heart disease of native coronary artery without angina pectoris: Secondary | ICD-10-CM | POA: Diagnosis not present

## 2017-04-27 DIAGNOSIS — I10 Essential (primary) hypertension: Secondary | ICD-10-CM | POA: Diagnosis not present

## 2017-04-27 DIAGNOSIS — I5032 Chronic diastolic (congestive) heart failure: Secondary | ICD-10-CM | POA: Diagnosis not present
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
## 2017-04-27 NOTE — Patient Instructions (Signed)

## 2017-05-01 ENCOUNTER — Other Ambulatory Visit (INDEPENDENT_AMBULATORY_CARE_PROVIDER_SITE_OTHER): Payer: Medicare Other

## 2017-05-01 DIAGNOSIS — E349 Endocrine disorder, unspecified: Secondary | ICD-10-CM

## 2017-05-01 DIAGNOSIS — E230 Hypopituitarism: Secondary | ICD-10-CM | POA: Diagnosis not present

## 2017-05-01 DIAGNOSIS — E2740 Unspecified adrenocortical insufficiency: Secondary | ICD-10-CM

## 2017-05-01 LAB — BASIC METABOLIC PANEL WITH GFR
BUN: 11 mg/dL (ref 6–23)
CO2: 29 meq/L (ref 19–32)
Calcium: 9.5 mg/dL (ref 8.4–10.5)
Chloride: 100 meq/L (ref 96–112)
Creatinine, Ser: 1.05 mg/dL (ref 0.40–1.50)
GFR: 73.89 mL/min
Glucose, Bld: 100 mg/dL — ABNORMAL HIGH (ref 70–99)
Potassium: 3.6 meq/L (ref 3.5–5.1)
Sodium: 136 meq/L (ref 135–145)

## 2017-05-01 LAB — TSH: TSH: 0.44 u[IU]/mL (ref 0.35–4.50)

## 2017-05-01 LAB — TESTOSTERONE: Testosterone: 394.27 ng/dL (ref 300.00–890.00)

## 2017-05-01 LAB — T4, FREE: Free T4: 1.23 ng/dL (ref 0.60–1.60)

## 2017-05-03 ENCOUNTER — Other Ambulatory Visit: Payer: Self-pay | Admitting: Family Medicine

## 2017-05-04 ENCOUNTER — Ambulatory Visit (INDEPENDENT_AMBULATORY_CARE_PROVIDER_SITE_OTHER): Payer: Medicare Other | Admitting: Endocrinology

## 2017-05-04 ENCOUNTER — Encounter: Payer: Self-pay | Admitting: Endocrinology

## 2017-05-04 VITALS — BP 130/84 | HR 152 | Ht 79.0 in | Wt 380.2 lb

## 2017-05-04 DIAGNOSIS — I639 Cerebral infarction, unspecified: Secondary | ICD-10-CM | POA: Diagnosis not present

## 2017-05-04 DIAGNOSIS — E2740 Unspecified adrenocortical insufficiency: Secondary | ICD-10-CM | POA: Diagnosis not present

## 2017-05-04 DIAGNOSIS — E230 Hypopituitarism: Secondary | ICD-10-CM

## 2017-05-04 DIAGNOSIS — E039 Hypothyroidism, unspecified: Secondary | ICD-10-CM

## 2017-05-04 MED ORDER — TESTOSTERONE CYPIONATE 200 MG/ML IM SOLN: 150.0000 mg | INTRAMUSCULAR | 0 refills | Status: DC

## 2017-05-04 MED ORDER — HYDROCORTISONE 10 MG PO TABS: ORAL_TABLET | ORAL | 3 refills | Status: DC

## 2017-05-04 NOTE — Patient Instructions (Signed)
Take 2 pills cortisone in am
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
## 2017-05-04 NOTE — Addendum Note (Signed)
Addended by: Anibal Henderson on: 05/04/2017 06:12 PM   Modules accepted: Orders

## 2017-05-05 ENCOUNTER — Ambulatory Visit (INDEPENDENT_AMBULATORY_CARE_PROVIDER_SITE_OTHER): Payer: Medicare Other | Admitting: Family Medicine

## 2017-05-05 ENCOUNTER — Encounter: Payer: Self-pay | Admitting: Family Medicine

## 2017-05-05 ENCOUNTER — Telehealth: Payer: Self-pay | Admitting: Physician Assistant

## 2017-05-05 VITALS — HR 87 | Temp 97.6°F | Wt 384.0 lb

## 2017-05-05 DIAGNOSIS — Z77098 Contact with and (suspected) exposure to other hazardous, chiefly nonmedicinal, chemicals: Secondary | ICD-10-CM

## 2017-05-05 DIAGNOSIS — R251 Tremor, unspecified: Secondary | ICD-10-CM | POA: Diagnosis not present

## 2017-05-05 DIAGNOSIS — R2689 Other abnormalities of gait and mobility: Secondary | ICD-10-CM

## 2017-05-05 DIAGNOSIS — E271 Primary adrenocortical insufficiency: Secondary | ICD-10-CM | POA: Diagnosis not present

## 2017-05-05 DIAGNOSIS — E230 Hypopituitarism: Secondary | ICD-10-CM

## 2017-05-05 DIAGNOSIS — I639 Cerebral infarction, unspecified: Secondary | ICD-10-CM

## 2017-05-05 NOTE — Telephone Encounter (Signed)
Error/MW °
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
## 2017-05-08 ENCOUNTER — Encounter: Payer: Self-pay | Admitting: Family Medicine

## 2017-05-08 ENCOUNTER — Ambulatory Visit (INDEPENDENT_AMBULATORY_CARE_PROVIDER_SITE_OTHER): Payer: Medicare Other | Admitting: Family Medicine

## 2017-05-08 VITALS — BP 118/78 | HR 89 | Temp 97.6°F | Wt 388.8 lb

## 2017-05-08 DIAGNOSIS — G20 Parkinson's disease: Secondary | ICD-10-CM

## 2017-05-08 DIAGNOSIS — I639 Cerebral infarction, unspecified: Secondary | ICD-10-CM | POA: Diagnosis not present

## 2017-05-08 DIAGNOSIS — Z77098 Contact with and (suspected) exposure to other hazardous, chiefly nonmedicinal, chemicals: Secondary | ICD-10-CM | POA: Diagnosis not present

## 2017-05-08 DIAGNOSIS — E271 Primary adrenocortical insufficiency: Secondary | ICD-10-CM
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
## 2017-05-11 DIAGNOSIS — G20 Parkinson's disease: Secondary | ICD-10-CM | POA: Diagnosis not present

## 2017-05-11 DIAGNOSIS — G4733 Obstructive sleep apnea (adult) (pediatric): Secondary | ICD-10-CM | POA: Diagnosis not present

## 2017-05-14 ENCOUNTER — Other Ambulatory Visit: Payer: Self-pay | Admitting: Family Medicine

## 2017-05-14 ENCOUNTER — Other Ambulatory Visit: Payer: Self-pay | Admitting: Endocrinology

## 2017-05-15 ENCOUNTER — Other Ambulatory Visit: Payer: Self-pay | Admitting: Endocrinology

## 2017-05-19 ENCOUNTER — Ambulatory Visit (INDEPENDENT_AMBULATORY_CARE_PROVIDER_SITE_OTHER): Payer: Medicare Other | Admitting: Physician Assistant

## 2017-05-19 DIAGNOSIS — I639 Cerebral infarction, unspecified: Secondary | ICD-10-CM | POA: Diagnosis not present

## 2017-05-19 DIAGNOSIS — E230 Hypopituitarism: Secondary | ICD-10-CM

## 2017-05-19 DIAGNOSIS — G20 Parkinson's disease: Secondary | ICD-10-CM | POA: Insufficient documentation

## 2017-05-19 NOTE — Progress Notes (Signed)
Patient was here today for his testosterone injection. Patient supplied. Patient was given testosterone injection without complication. He is to return in 2 weeks for next injection.

## 2017-05-22 ENCOUNTER — Ambulatory Visit (INDEPENDENT_AMBULATORY_CARE_PROVIDER_SITE_OTHER): Payer: Medicare Other | Admitting: *Deleted

## 2017-05-22 DIAGNOSIS — I639 Cerebral infarction, unspecified: Secondary | ICD-10-CM

## 2017-05-22 NOTE — Progress Notes (Signed)
Carelink Summary Report / Loop Recorder 

## 2017-05-23 ENCOUNTER — Encounter: Payer: Self-pay | Admitting: Neurology

## 2017-05-27 ENCOUNTER — Other Ambulatory Visit: Payer: Self-pay

## 2017-05-27 ENCOUNTER — Emergency Department: Payer: Medicare Other

## 2017-05-27 ENCOUNTER — Encounter: Payer: Self-pay | Admitting: Emergency Medicine

## 2017-05-27 ENCOUNTER — Emergency Department
Admission: EM | Admit: 2017-05-27 | Discharge: 2017-05-27 | Disposition: A | Discharge status: Home / Self Care | Payer: Medicare Other | Attending: Emergency Medicine | Admitting: Emergency Medicine

## 2017-05-27 DIAGNOSIS — R350 Frequency of micturition: Secondary | ICD-10-CM | POA: Diagnosis not present

## 2017-05-27 DIAGNOSIS — R251 Tremor, unspecified: Secondary | ICD-10-CM | POA: Insufficient documentation

## 2017-05-27 DIAGNOSIS — Z79899 Other long term (current) drug therapy: Secondary | ICD-10-CM | POA: Diagnosis not present

## 2017-05-27 DIAGNOSIS — G20 Parkinson's disease: Secondary | ICD-10-CM | POA: Diagnosis not present

## 2017-05-27 DIAGNOSIS — G8929 Other chronic pain: Secondary | ICD-10-CM | POA: Diagnosis not present

## 2017-05-27 DIAGNOSIS — E271 Primary adrenocortical insufficiency: Secondary | ICD-10-CM | POA: Diagnosis not present

## 2017-05-27 DIAGNOSIS — E039 Hypothyroidism, unspecified: Secondary | ICD-10-CM | POA: Diagnosis not present

## 2017-05-27 DIAGNOSIS — R5383 Other fatigue: Secondary | ICD-10-CM | POA: Diagnosis not present

## 2017-05-27 DIAGNOSIS — I252 Old myocardial infarction: Secondary | ICD-10-CM | POA: Diagnosis not present

## 2017-05-27 DIAGNOSIS — R0602 Shortness of breath: Secondary | ICD-10-CM | POA: Insufficient documentation

## 2017-05-27 DIAGNOSIS — Z7982 Long term (current) use of aspirin: Secondary | ICD-10-CM | POA: Insufficient documentation

## 2017-05-27 DIAGNOSIS — I2510 Atherosclerotic heart disease of native coronary artery without angina pectoris: Secondary | ICD-10-CM | POA: Insufficient documentation

## 2017-05-27 DIAGNOSIS — Z8673 Personal history of transient ischemic attack (TIA), and cerebral infarction without residual deficits: Secondary | ICD-10-CM | POA: Insufficient documentation

## 2017-05-27 DIAGNOSIS — I509 Heart failure, unspecified: Secondary | ICD-10-CM | POA: Diagnosis not present

## 2017-05-27 DIAGNOSIS — N189 Chronic kidney disease, unspecified: Secondary | ICD-10-CM | POA: Diagnosis not present

## 2017-05-27 DIAGNOSIS — R531 Weakness: Secondary | ICD-10-CM | POA: Insufficient documentation

## 2017-05-27 DIAGNOSIS — I130 Hypertensive heart and chronic kidney disease with heart failure and stage 1 through stage 4 chronic kidney disease, or unspecified chronic kidney disease: Secondary | ICD-10-CM | POA: Diagnosis not present

## 2017-05-27 LAB — URINALYSIS, COMPLETE (UACMP) WITH MICROSCOPIC
Bilirubin Urine: NEGATIVE
Glucose, UA: NEGATIVE mg/dL
Hgb urine dipstick: NEGATIVE
Ketones, ur: 5 mg/dL — AB
Leukocytes, UA: NEGATIVE
Nitrite: NEGATIVE
Protein, ur: NEGATIVE mg/dL
Specific Gravity, Urine: 1.021 (ref 1.005–1.030)
pH: 5 (ref 5.0–8.0)

## 2017-05-27 LAB — CK: Total CK: 23 U/L — ABNORMAL LOW (ref 49–397)

## 2017-05-27 LAB — CBC
HCT: 47.5 % (ref 40.0–52.0)
Hemoglobin: 15.7 g/dL (ref 13.0–18.0)
MCH: 27.5 pg (ref 26.0–34.0)
MCHC: 33.2 g/dL (ref 32.0–36.0)
MCV: 82.8 fL (ref 80.0–100.0)
Platelets: 198 10*3/uL (ref 150–440)
RBC: 5.73 MIL/uL (ref 4.40–5.90)
RDW: 14.6 % — ABNORMAL HIGH (ref 11.5–14.5)
WBC: 6.7 10*3/uL (ref 3.8–10.6)

## 2017-05-27 LAB — COMPREHENSIVE METABOLIC PANEL
ALT: 7 U/L — ABNORMAL LOW (ref 17–63)
AST: 23 U/L (ref 15–41)
Albumin: 3.8 g/dL (ref 3.5–5.0)
Alkaline Phosphatase: 60 U/L (ref 38–126)
Anion gap: 8 (ref 5–15)
BUN: 11 mg/dL (ref 6–20)
CO2: 28 mmol/L (ref 22–32)
Calcium: 9 mg/dL (ref 8.9–10.3)
Chloride: 100 mmol/L — ABNORMAL LOW (ref 101–111)
Creatinine, Ser: 0.9 mg/dL (ref 0.61–1.24)
GFR calc Af Amer: >60 mL/min (ref >60)
GFR calc non Af Amer: >60 mL/min (ref >60)
Glucose, Bld: 121 mg/dL — ABNORMAL HIGH (ref 65–99)
Potassium: 3.5 mmol/L (ref 3.5–5.1)
Sodium: 136 mmol/L (ref 135–145)
Total Bilirubin: 1.2 mg/dL (ref 0.3–1.2)
Total Protein: 7.2 g/dL (ref 6.5–8.1)

## 2017-05-27 LAB — TROPONIN I: Troponin I: <0.03 ng/mL (ref <0.03)

## 2017-05-27 LAB — CUP PACEART REMOTE DEVICE CHECK
Date Time Interrogation Session: 20181112023957
Implantable Pulse Generator Implant Date: 20170719

## 2017-05-27 LAB — BRAIN NATRIURETIC PEPTIDE: B Natriuretic Peptide: 47 pg/mL (ref 0.0–100.0)

## 2017-05-27 NOTE — ED Notes (Signed)
ED Provider at bedside. 

## 2017-05-27 NOTE — Discharge Instructions (Signed)
Our neurologist recommended increasing your sinemet to 4 times per day dosing
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
## 2017-05-27 NOTE — ED Notes (Signed)
X-ray at bedside

## 2017-05-27 NOTE — ED Triage Notes (Signed)
Pt to ED via POV. Pt has hx/o stage 4 Parkinson's disease and addison's disease. Pt states that over the past 6 days his tremors have gotten worse, he has felt more short of breath and feels like he is gasping for air, pt reports loss of appetite and sleeping more than normal. Pt O2 sat in triage 100%. Pt appears to be extremely anxious.

## 2017-05-31 ENCOUNTER — Ambulatory Visit (INDEPENDENT_AMBULATORY_CARE_PROVIDER_SITE_OTHER): Payer: Medicare Other | Admitting: Physician Assistant

## 2017-05-31 DIAGNOSIS — G20 Parkinson's disease: Secondary | ICD-10-CM | POA: Diagnosis not present

## 2017-05-31 DIAGNOSIS — E230 Hypopituitarism: Secondary | ICD-10-CM | POA: Diagnosis not present

## 2017-05-31 DIAGNOSIS — I639 Cerebral infarction, unspecified: Secondary | ICD-10-CM | POA: Diagnosis not present

## 2017-05-31 DIAGNOSIS — G4733 Obstructive sleep apnea (adult) (pediatric): Secondary | ICD-10-CM | POA: Diagnosis not present

## 2017-05-31 DIAGNOSIS — R600 Localized edema: Secondary | ICD-10-CM | POA: Diagnosis not present

## 2017-05-31 DIAGNOSIS — Z8673 Personal history of transient ischemic attack (TIA), and cerebral infarction without residual deficits: Secondary | ICD-10-CM | POA: Diagnosis not present

## 2017-05-31 NOTE — Progress Notes (Signed)
Testosterone injection only today. No E/M. I was available for questions or concerns.

## 2017-06-06 ENCOUNTER — Emergency Department: Payer: Medicare Other

## 2017-06-06 ENCOUNTER — Other Ambulatory Visit: Payer: Self-pay

## 2017-06-06 ENCOUNTER — Inpatient Hospital Stay
Admission: EM | Admit: 2017-06-06 | Discharge: 2017-06-08 | DRG: 603 | Disposition: A | Discharge status: Home / Self Care | Payer: Medicare Other | Attending: Internal Medicine | Admitting: Internal Medicine

## 2017-06-06 DIAGNOSIS — E669 Obesity, unspecified: Secondary | ICD-10-CM | POA: Diagnosis present

## 2017-06-06 DIAGNOSIS — M7989 Other specified soft tissue disorders: Secondary | ICD-10-CM | POA: Diagnosis not present

## 2017-06-06 DIAGNOSIS — I252 Old myocardial infarction: Secondary | ICD-10-CM

## 2017-06-06 DIAGNOSIS — I5032 Chronic diastolic (congestive) heart failure: Secondary | ICD-10-CM | POA: Diagnosis present

## 2017-06-06 DIAGNOSIS — L0390 Cellulitis, unspecified: Secondary | ICD-10-CM | POA: Diagnosis present

## 2017-06-06 DIAGNOSIS — L03115 Cellulitis of right lower limb: Secondary | ICD-10-CM | POA: Diagnosis not present

## 2017-06-06 DIAGNOSIS — E785 Hyperlipidemia, unspecified: Secondary | ICD-10-CM | POA: Diagnosis present

## 2017-06-06 DIAGNOSIS — Z91013 Allergy to seafood: Secondary | ICD-10-CM

## 2017-06-06 DIAGNOSIS — G20 Parkinson's disease: Secondary | ICD-10-CM | POA: Diagnosis present

## 2017-06-06 DIAGNOSIS — Z8249 Family history of ischemic heart disease and other diseases of the circulatory system: Secondary | ICD-10-CM

## 2017-06-06 DIAGNOSIS — J449 Chronic obstructive pulmonary disease, unspecified: Secondary | ICD-10-CM | POA: Diagnosis present

## 2017-06-06 DIAGNOSIS — I110 Hypertensive heart disease with heart failure: Secondary | ICD-10-CM | POA: Diagnosis present

## 2017-06-06 DIAGNOSIS — Z91030 Bee allergy status: Secondary | ICD-10-CM

## 2017-06-06 DIAGNOSIS — E039 Hypothyroidism, unspecified: Secondary | ICD-10-CM | POA: Diagnosis present

## 2017-06-06 DIAGNOSIS — Z515 Encounter for palliative care: Secondary | ICD-10-CM | POA: Diagnosis present

## 2017-06-06 DIAGNOSIS — Z8546 Personal history of malignant neoplasm of prostate: Secondary | ICD-10-CM | POA: Diagnosis not present

## 2017-06-06 DIAGNOSIS — E271 Primary adrenocortical insufficiency: Secondary | ICD-10-CM | POA: Diagnosis not present

## 2017-06-06 DIAGNOSIS — I2510 Atherosclerotic heart disease of native coronary artery without angina pectoris: Secondary | ICD-10-CM | POA: Diagnosis present

## 2017-06-06 DIAGNOSIS — Z7982 Long term (current) use of aspirin: Secondary | ICD-10-CM

## 2017-06-06 DIAGNOSIS — Z6841 Body Mass Index (BMI) 40.0 and over, adult: Secondary | ICD-10-CM

## 2017-06-06 DIAGNOSIS — Z8547 Personal history of malignant neoplasm of testis: Secondary | ICD-10-CM | POA: Diagnosis not present

## 2017-06-06 DIAGNOSIS — Z8673 Personal history of transient ischemic attack (TIA), and cerebral infarction without residual deficits: Secondary | ICD-10-CM | POA: Diagnosis not present

## 2017-06-06 DIAGNOSIS — G4733 Obstructive sleep apnea (adult) (pediatric): Secondary | ICD-10-CM | POA: Diagnosis present

## 2017-06-06 DIAGNOSIS — Z79890 Hormone replacement therapy (postmenopausal): Secondary | ICD-10-CM | POA: Diagnosis not present

## 2017-06-06 DIAGNOSIS — M109 Gout, unspecified: Secondary | ICD-10-CM | POA: Diagnosis present

## 2017-06-06 DIAGNOSIS — Z7902 Long term (current) use of antithrombotics/antiplatelets: Secondary | ICD-10-CM

## 2017-06-06 DIAGNOSIS — Z79899 Other long term (current) drug therapy: Secondary | ICD-10-CM

## 2017-06-06 DIAGNOSIS — Z981 Arthrodesis status: Secondary | ICD-10-CM | POA: Diagnosis not present

## 2017-06-06 DIAGNOSIS — E2740 Unspecified adrenocortical insufficiency: Secondary | ICD-10-CM | POA: Diagnosis not present

## 2017-06-06 DIAGNOSIS — F329 Major depressive disorder, single episode, unspecified: Secondary | ICD-10-CM | POA: Diagnosis present

## 2017-06-06 DIAGNOSIS — Z7189 Other specified counseling: Secondary | ICD-10-CM | POA: Diagnosis not present

## 2017-06-06 DIAGNOSIS — Z96652 Presence of left artificial knee joint: Secondary | ICD-10-CM | POA: Diagnosis present

## 2017-06-06 DIAGNOSIS — Z87442 Personal history of urinary calculi: Secondary | ICD-10-CM

## 2017-06-06 DIAGNOSIS — I10 Essential (primary) hypertension: Secondary | ICD-10-CM | POA: Diagnosis not present

## 2017-06-06 DIAGNOSIS — Z888 Allergy status to other drugs, medicaments and biological substances status: Secondary | ICD-10-CM

## 2017-06-06 HISTORY — DX: Primary adrenocortical insufficiency: E27.1

## 2017-06-06 LAB — CBC WITH DIFFERENTIAL/PLATELET
Basophils Absolute: 0 10*3/uL (ref 0–0.1)
Basophils Relative: 1 %
Eosinophils Absolute: 0.1 10*3/uL (ref 0–0.7)
Eosinophils Relative: 2 %
HCT: 48.1 % (ref 40.0–52.0)
Hemoglobin: 15.7 g/dL (ref 13.0–18.0)
Lymphocytes Relative: 14 %
Lymphs Abs: 1 10*3/uL (ref 1.0–3.6)
MCH: 26.7 pg (ref 26.0–34.0)
MCHC: 32.7 g/dL (ref 32.0–36.0)
MCV: 81.7 fL (ref 80.0–100.0)
Monocytes Absolute: 0.5 10*3/uL (ref 0.2–1.0)
Monocytes Relative: 7 %
Neutro Abs: 5.7 10*3/uL (ref 1.4–6.5)
Neutrophils Relative %: 76 %
Platelets: 188 10*3/uL (ref 150–440)
RBC: 5.88 MIL/uL (ref 4.40–5.90)
RDW: 14.3 % (ref 11.5–14.5)
WBC: 7.4 10*3/uL (ref 3.8–10.6)

## 2017-06-06 LAB — COMPREHENSIVE METABOLIC PANEL
ALT: <5 U/L — ABNORMAL LOW (ref 17–63)
AST: 18 U/L (ref 15–41)
Albumin: 3.8 g/dL (ref 3.5–5.0)
Alkaline Phosphatase: 58 U/L (ref 38–126)
Anion gap: 11 (ref 5–15)
BUN: 13 mg/dL (ref 6–20)
CO2: 27 mmol/L (ref 22–32)
Calcium: 9.4 mg/dL (ref 8.9–10.3)
Chloride: 97 mmol/L — ABNORMAL LOW (ref 101–111)
Creatinine, Ser: 1.08 mg/dL (ref 0.61–1.24)
GFR calc Af Amer: >60 mL/min (ref >60)
GFR calc non Af Amer: >60 mL/min (ref >60)
Glucose, Bld: 96 mg/dL (ref 65–99)
Potassium: 3.5 mmol/L (ref 3.5–5.1)
Sodium: 135 mmol/L (ref 135–145)
Total Bilirubin: 2.2 mg/dL — ABNORMAL HIGH (ref 0.3–1.2)
Total Protein: 7.5 g/dL (ref 6.5–8.1)

## 2017-06-06 LAB — BRAIN NATRIURETIC PEPTIDE: B Natriuretic Peptide: 46 pg/mL (ref 0.0–100.0)

## 2017-06-06 MED ORDER — OXYCODONE-ACETAMINOPHEN 5-325 MG PO TABS
2.0000 | ORAL_TABLET | ORAL | Status: DC | PRN
  Administered 2017-06-06: 2 via ORAL
  Filled 2017-06-06: qty 2

## 2017-06-06 MED ORDER — SERTRALINE HCL 50 MG PO TABS
100.0000 mg | ORAL_TABLET | Freq: Every day | ORAL | Status: DC
  Administered 2017-06-07 – 2017-06-08 (×2): 100 mg via ORAL
  Filled 2017-06-06 (×2): qty 2

## 2017-06-06 MED ORDER — PIPERACILLIN-TAZOBACTAM 3.375 G IVPB 30 MIN
3.3750 g | Freq: Once | INTRAVENOUS | Status: AC
  Administered 2017-06-06: 3.375 g via INTRAVENOUS
  Filled 2017-06-06: qty 50

## 2017-06-06 MED ORDER — ALBUTEROL SULFATE (2.5 MG/3ML) 0.083% IN NEBU: 2.5000 mg | INHALATION_SOLUTION | Freq: Four times a day (QID) | RESPIRATORY_TRACT | Status: DC | PRN

## 2017-06-06 MED ORDER — HYDROCORTISONE 10 MG PO TABS
10.0000 mg | ORAL_TABLET | Freq: Every day | ORAL | Status: DC
  Administered 2017-06-06 – 2017-06-07 (×2): 10 mg via ORAL
  Filled 2017-06-06 (×2): qty 1

## 2017-06-06 MED ORDER — ONDANSETRON HCL 4 MG PO TABS: 4.0000 mg | ORAL_TABLET | Freq: Four times a day (QID) | ORAL | Status: DC | PRN

## 2017-06-06 MED ORDER — MOMETASONE FURO-FORMOTEROL FUM 200-5 MCG/ACT IN AERO
2.0000 | INHALATION_SPRAY | Freq: Two times a day (BID) | RESPIRATORY_TRACT | Status: DC
  Filled 2017-06-06: qty 8.8

## 2017-06-06 MED ORDER — ACETAMINOPHEN 650 MG RE SUPP: 650.0000 mg | Freq: Four times a day (QID) | RECTAL | Status: DC | PRN

## 2017-06-06 MED ORDER — ALLOPURINOL 100 MG PO TABS
100.0000 mg | ORAL_TABLET | Freq: Two times a day (BID) | ORAL | Status: DC
  Administered 2017-06-06 – 2017-06-08 (×4): 100 mg via ORAL
  Filled 2017-06-06 (×4): qty 1

## 2017-06-06 MED ORDER — ONDANSETRON HCL 4 MG/2ML IJ SOLN: 4.0000 mg | Freq: Four times a day (QID) | INTRAMUSCULAR | Status: DC | PRN

## 2017-06-06 MED ORDER — ASPIRIN EC 81 MG PO TBEC
81.0000 mg | DELAYED_RELEASE_TABLET | Freq: Every day | ORAL | Status: DC
  Administered 2017-06-06 – 2017-06-08 (×3): 81 mg via ORAL
  Filled 2017-06-06 (×3): qty 1

## 2017-06-06 MED ORDER — LOSARTAN POTASSIUM 50 MG PO TABS
100.0000 mg | ORAL_TABLET | Freq: Every day | ORAL | Status: DC
  Administered 2017-06-06 – 2017-06-08 (×3): 100 mg via ORAL
  Filled 2017-06-06 (×3): qty 2

## 2017-06-06 MED ORDER — CARVEDILOL 6.25 MG PO TABS
3.1250 mg | ORAL_TABLET | Freq: Two times a day (BID) | ORAL | Status: DC
  Administered 2017-06-06 – 2017-06-08 (×3): 3.125 mg via ORAL
  Filled 2017-06-06 (×4): qty 1

## 2017-06-06 MED ORDER — POLYETHYLENE GLYCOL 3350 17 G PO PACK: 17.0000 g | PACK | Freq: Every day | ORAL | Status: DC | PRN

## 2017-06-06 MED ORDER — ATORVASTATIN CALCIUM 20 MG PO TABS
20.0000 mg | ORAL_TABLET | Freq: Every day | ORAL | Status: DC
  Administered 2017-06-06 – 2017-06-07 (×2): 20 mg via ORAL
  Filled 2017-06-06 (×2): qty 1

## 2017-06-06 MED ORDER — LEVOTHYROXINE SODIUM 112 MCG PO TABS
112.0000 ug | ORAL_TABLET | Freq: Every day | ORAL | Status: DC
Start: 2017-06-07 — End: 2017-06-07
  Filled 2017-06-06: qty 1

## 2017-06-06 MED ORDER — VANCOMYCIN HCL 10 G IV SOLR
1250.0000 mg | Freq: Once | INTRAVENOUS | Status: AC
  Administered 2017-06-06: 1250 mg via INTRAVENOUS
  Filled 2017-06-06: qty 1250

## 2017-06-06 MED ORDER — INDOMETHACIN 50 MG PO CAPS
50.0000 mg | ORAL_CAPSULE | Freq: Three times a day (TID) | ORAL | Status: DC
  Administered 2017-06-06 – 2017-06-08 (×5): 50 mg via ORAL
  Filled 2017-06-06 (×7): qty 1

## 2017-06-06 MED ORDER — HYDROCORTISONE 10 MG PO TABS
20.0000 mg | ORAL_TABLET | Freq: Every day | ORAL | Status: DC
  Administered 2017-06-07 – 2017-06-08 (×2): 20 mg via ORAL
  Filled 2017-06-06 (×2): qty 2

## 2017-06-06 MED ORDER — FUROSEMIDE 40 MG PO TABS
40.0000 mg | ORAL_TABLET | Freq: Every day | ORAL | Status: DC
  Administered 2017-06-06 – 2017-06-08 (×3): 40 mg via ORAL
  Filled 2017-06-06 (×3): qty 1

## 2017-06-06 MED ORDER — CARBIDOPA-LEVODOPA 25-250 MG PO TABS
2.0000 | ORAL_TABLET | Freq: Four times a day (QID) | ORAL | Status: DC
  Administered 2017-06-06 – 2017-06-08 (×8): 2 via ORAL
  Filled 2017-06-06 (×9): qty 2

## 2017-06-06 MED ORDER — ACETAMINOPHEN 325 MG PO TABS: 650.0000 mg | ORAL_TABLET | Freq: Four times a day (QID) | ORAL | Status: DC | PRN

## 2017-06-06 MED ORDER — ENOXAPARIN SODIUM 40 MG/0.4ML ~~LOC~~ SOLN
40.0000 mg | Freq: Two times a day (BID) | SUBCUTANEOUS | Status: DC
  Administered 2017-06-06 – 2017-06-08 (×4): 40 mg via SUBCUTANEOUS
  Filled 2017-06-06 (×4): qty 0.4

## 2017-06-06 MED ORDER — CLOPIDOGREL BISULFATE 75 MG PO TABS
75.0000 mg | ORAL_TABLET | Freq: Every day | ORAL | Status: DC
  Administered 2017-06-06 – 2017-06-08 (×3): 75 mg via ORAL
  Filled 2017-06-06 (×3): qty 1

## 2017-06-06 MED ORDER — VANCOMYCIN HCL IN DEXTROSE 1-5 GM/200ML-% IV SOLN
1000.0000 mg | Freq: Three times a day (TID) | INTRAVENOUS | Status: DC
  Administered 2017-06-06 – 2017-06-07 (×2): 1000 mg via INTRAVENOUS
  Filled 2017-06-06 (×5): qty 200

## 2017-06-06 NOTE — ED Notes (Signed)
Pt taken to scans via stretcher.  

## 2017-06-06 NOTE — Progress Notes (Signed)
ANTIBIOTIC CONSULT NOTE - INITIAL  Pharmacy Consult for vancomycin Indication: cellulitis  Allergies  Allergen Reactions  . Bee Venom Anaphylaxis  . Shrimp [Shellfish Allergy] Anaphylaxis and Other (See Comments)    "just shrimp"  . Stadol [Butorphanol] Anaphylaxis and Other (See Comments)    respiratory  Distress, couldn't breathe, cardiac arrest  . Wasp Venom Anaphylaxis    Patient Measurements: Height: 6\' 7"  (200.7 cm) Weight: (!) 365 lb (165.6 kg) IBW/kg (Calculated) : 93.7 Adjusted Body Weight: 122.5 kg  Vital Signs: Temp: 97.8 F (36.6 C) (11/27 1400) Temp Source: Oral (11/27 1400) BP: 157/72 (11/27 1400) Pulse Rate: 75 (11/27 1400) Intake/Output from previous day: No intake/output data recorded. Intake/Output from this shift: No intake/output data recorded.  Labs: Recent Labs    06/06/17 1017  WBC 7.4  HGB 15.7  PLT 188  CREATININE 1.08   Estimated Creatinine Clearance: 108.7 mL/min (by C-G formula based on SCr of 1.08 mg/dL). No results for input(s): VANCOTROUGH, VANCOPEAK, VANCORANDOM, GENTTROUGH, GENTPEAK, GENTRANDOM, TOBRATROUGH, TOBRAPEAK, TOBRARND, AMIKACINPEAK, AMIKACINTROU, AMIKACIN in the last 72 hours.   Microbiology: No results found for this or any previous visit (from the past 720 hour(s)).  Medical History: Past Medical History:  Diagnosis Date  . Acute CVA (cerebrovascular accident) ([place]) 01/22/2016  . Addison disease ([place])   . Arthritis    low back - DDD  . Cancer [facility])    Testicular Cancer  . Cellulitis of scrotum   . Cellulitis, scrotum 08/02/2014  . CHF (congestive heart failure) ([place])   . Chronic lower back pain    "from [place] 2007"  . Complication of anesthesia    Sometimes has N&V /w anesth.   . Coronary artery disease    Cath 2001  . Elevated PSA   . Epididymitis, left 08/04/2014  . History of chronic bronchitis   . History of gout   . Hypertension   . Hypocholesteremia   . Hypothyroidism   . Kidney stone   . Myocardial  infarction [facility]) 2001   2001- cardiac cath., cardiac clearanece note dr Otho Perl 05-14-13 on chart, stress test results 02-21-12 on chart  . OSA on CPAP    cpap setting of 10  . Pneumonia 2000's and 2013  . PONV (postoperative nausea and vomiting)   . [facility] spotted fever   . Stroke [facility]) 2004   "right brain stem; no residual "    Medications:  Infusions:  . vancomycin     Assessment: 71 yom cc leg pain, blood infection. PMH CVA, Addison's disease, history of testicular cancer and prostate cancer, gout, HTN, HLD, hypothyroidism, hx OSA, parkinson's disease.   HPI recent ED visit for Parkinson's disease/tremors and cellulitis noted but has significantly worsened per wife. Has reddened and swelled. Represented to ED and RLE cellulitis noted. No abscess per notes, [name] negative for DVT.   Goal of Therapy:  Vancomycin trough 10 to 15 mcg/mL Resolve infection Prevent ADE  Plan:  Vancomycin 1.25 gm IV x 1 in ED followed in approximately 6 hours (stacked dosing) by vancomycin 1 gm IV Q8H, predicted trough 11 mcg/mL. Pharmacy will continue to follow and adjust as needed to maintain trough 10 to 15 mcg/mL. Will plan to check first trough level 11/28 at 17:30, before fifth dose.   Vd 85.7 L, Ke 0.095 hr-1, T1/2 7. 3 hr  Laural Benes, Pharm.D., BCPS Clincal Pharmacist 06/06/2017,3:01 PM
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
## 2017-06-06 NOTE — ED Notes (Signed)
Pt given graham crackers, peanut butter, water, and applesauce.

## 2017-06-06 NOTE — ED Triage Notes (Signed)
Pt c/o right leg pain with redness with swelling for the past 2 weeks that has progressively worse. States he has a hx of cellulitis.

## 2017-06-06 NOTE — ED Notes (Signed)
Pt returned from U/S via stretcher. 

## 2017-06-06 NOTE — ED Notes (Signed)
Patient transported to Ultrasound 

## 2017-06-06 NOTE — Progress Notes (Signed)
Anticoagulation monitoring(Lovenox):  71 yo male ordered Lovenox 40 mg Q24h  Filed Weights   06/06/17 0957  Weight: (!) 365 lb (165.6 kg)   BMI 41.1    Lab Results  Component Value Date   CREATININE 1.08 06/06/2017   CREATININE 0.90 05/27/2017   CREATININE 1.05 05/01/2017   Estimated Creatinine Clearance: 108.7 mL/min (by C-G formula based on SCr of 1.08 mg/dL). Hemoglobin & Hematocrit     Component Value Date/Time   HGB 15.7 06/06/2017 1017   HGB 15.3 08/11/2012 1640   HCT 48.1 06/06/2017 1017   HCT 45.4 08/11/2012 1640     Per Protocol for Patient with estCrcl > 30 ml/min and BMI > 40, will transition to Lovenox 40 mg Q12h.
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
## 2017-06-07 LAB — BASIC METABOLIC PANEL
Anion gap: 8 (ref 5–15)
BUN: 14 mg/dL (ref 6–20)
CO2: 30 mmol/L (ref 22–32)
Calcium: 8.8 mg/dL — ABNORMAL LOW (ref 8.9–10.3)
Chloride: 95 mmol/L — ABNORMAL LOW (ref 101–111)
Creatinine, Ser: 1.11 mg/dL (ref 0.61–1.24)
GFR calc Af Amer: >60 mL/min (ref >60)
GFR calc non Af Amer: >60 mL/min (ref >60)
Glucose, Bld: 108 mg/dL — ABNORMAL HIGH (ref 65–99)
Potassium: 4.1 mmol/L (ref 3.5–5.1)
Sodium: 133 mmol/L — ABNORMAL LOW (ref 135–145)

## 2017-06-07 LAB — CBC
HCT: 44 % (ref 40.0–52.0)
Hemoglobin: 14.4 g/dL (ref 13.0–18.0)
MCH: 26.4 pg (ref 26.0–34.0)
MCHC: 32.8 g/dL (ref 32.0–36.0)
MCV: 80.6 fL (ref 80.0–100.0)
Platelets: 152 10*3/uL (ref 150–440)
RBC: 5.46 MIL/uL (ref 4.40–5.90)
RDW: 14.2 % (ref 11.5–14.5)
WBC: 5.3 10*3/uL (ref 3.8–10.6)

## 2017-06-07 MED ORDER — CEFAZOLIN SODIUM-DEXTROSE 1-4 GM/50ML-% IV SOLN
1.0000 g | Freq: Three times a day (TID) | INTRAVENOUS | Status: DC
  Filled 2017-06-07 (×2): qty 50

## 2017-06-07 MED ORDER — SALINE SPRAY 0.65 % NA SOLN
1.0000 | NASAL | Status: DC | PRN
  Administered 2017-06-07 (×2): 1 via NASAL
  Filled 2017-06-07: qty 44

## 2017-06-07 MED ORDER — DEXTROSE 5 % IV SOLN
1000.0000 mg | Freq: Three times a day (TID) | INTRAVENOUS | Status: DC
  Filled 2017-06-07 (×2): qty 10

## 2017-06-07 MED ORDER — LEVOTHYROXINE SODIUM 112 MCG PO TABS
224.0000 ug | ORAL_TABLET | Freq: Every day | ORAL | Status: DC
  Administered 2017-06-08: 224 ug via ORAL
  Filled 2017-06-07: qty 2

## 2017-06-07 MED ORDER — DEXTROSE 5 % IV SOLN
2.0000 g | Freq: Three times a day (TID) | INTRAVENOUS | Status: DC
  Administered 2017-06-07 – 2017-06-08 (×3): 2 g via INTRAVENOUS
  Filled 2017-06-07 (×5): qty 2000

## 2017-06-07 NOTE — Evaluation (Signed)
Physical Therapy Evaluation Patient Details Name: Dennis Frett Rahming Sr. MRN: [id number] DOB: [birth year] Today's Date: 06/07/2017   History of Present Illness  Pt is a43 y.o.malewith a known history of previous CVA, Addison's disease, history of testicular cancer, history of prostate cancer, gout, hypertension, hyperlipidemia, hypothyroidism, history of obstructive sleep apnea, Parkinson's disease who presents to the hospital due to right lower extremity redness swelling and pain. Patient's wife says that patient's tremors from Parkinson's have gotten worse and he was brought to the ER a few days back. At that point he had some right lower extremity redness and swelling but not significantly bad. Over the past few days the swelling and redness have gotten significantly worse and he was also noted to have a fever at home although it was not documented. She brought him to the ER for further evaluation and clinically he was noted to have right lower extremity cellulitis and hospitalist services were contacted further treatment and evaluation. Assessment includes: RLE cellulitis with (-) DVT, HTN, CHF, Parkinson's Dz, hypothyroidism, Addison's Dz, gout, depression, and HLD.    Clinical Impression  Pt presents with min deficits in strength, transfers, gait, and balance, and mod deficits in activity tolerance.  Pt Ind with bed mobility tasks and SBA with transfers with good initial stability.  Pt able to amb 60' with RW with good stability but fatigued and required return to sitting.  SpO2 and HR WFL during session with min SOB after amb.  Pt will benefit from HHPT services upon discharge to safely address above deficits for decreased caregiver assistance and eventual return to PLOF.      Follow Up Recommendations Home health PT    Equipment Recommendations  Other (comment)(Per [facility], [facility]; pt would benefit from bariatric RW if that does not interfere with obtaining a  U-walker from the [place]; Patient 6'7" and 360 lbs)    Recommendations for Other Services       Precautions / Restrictions Precautions Precautions: Fall Restrictions Weight Bearing Restrictions: No      Mobility  Bed Mobility Overal bed mobility: Independent                Transfers Overall transfer level: Needs assistance Equipment used: Rolling walker (2 wheeled) Transfers: Sit to/from Stand Sit to Stand: Supervision         General transfer comment: Pt steady upon initial stand with good concentric and eccentric control   Ambulation/Gait Ambulation/Gait assistance: Supervision Ambulation Distance (Feet): 60 Feet Assistive device: Rolling walker (2 wheeled) Gait Pattern/deviations: Step-through pattern;Decreased step length - right;Decreased step length - left;Trunk flexed   Gait velocity interpretation: Below normal speed for age/gender General Gait Details: Pt steady with amb but limited by deficits in activity tolerance/strength  Stairs Stairs: (Deferred)          Wheelchair Mobility    Modified Rankin (Stroke Patients Only)       Balance Overall balance assessment: Needs assistance   Sitting balance-Leahy Scale: Normal     Standing balance support: Bilateral upper extremity supported Standing balance-Leahy Scale: Good                               Pertinent Vitals/Pain Pain Assessment: 0-10 Pain Score: 2  Pain Location: General body pain, chronic per patient Pain Descriptors / Indicators: Dull;Discomfort Pain Intervention(s): Monitored during session    Home Living Family/patient expects to be discharged to:: Private residence Living  Arrangements: Spouse/significant other;Children Available Help at Discharge: Family;Available 24 hours/day(Adult son home during the day and able to assist) Type of Home: House Home Access: Stairs to enter Entrance Stairs-Rails: Right;Left;Can reach both Entrance Stairs-Number of Steps:  4 Home Layout: Two level;Able to live on main level with bedroom/bathroom Home Equipment: Kasandra Knudsen - single point Additional Comments: Borrows a w/c when needed, [place] working on getting pt a Teacher, English as a foreign language    Prior Function Level of Independence: Independent with assistive device(s)         Comments: Mod Ind with amb mostly HH distances with use of SPC     Hand Dominance   Dominant Hand: Right    Extremity/Trunk Assessment   Upper Extremity Assessment Upper Extremity Assessment: Overall WFL for tasks assessed    Lower Extremity Assessment Lower Extremity Assessment: Generalized weakness       Communication   Communication: No difficulties  Cognition Arousal/Alertness: Awake/alert Behavior During Therapy: WFL for tasks assessed/performed Overall Cognitive Status: Within Functional Limits for tasks assessed                                        General Comments      Exercises Total Joint Exercises Ankle Circles/Pumps: AROM;Both;10 reps Long Arc Quad: AROM;Both;5 reps Knee Flexion: AROM;Both;5 reps Other Exercises Other Exercises: Pt education on physiological benefits of activity and principles of activity progression Other Exercises: HEP education for BLE LAQs, APs, QS, and GS x 10 each 5-6x/day   Assessment/Plan    PT Assessment Patient needs continued PT services  PT Problem List Decreased balance;Decreased activity tolerance;Decreased strength;Decreased knowledge of use of DME       PT Treatment Interventions DME instruction;Gait training;Stair training;Functional mobility training;Neuromuscular re-education;Balance training;Therapeutic exercise;Therapeutic activities;Patient/family education    PT Goals (Current goals can be found in the Care Plan section)  Acute Rehab PT Goals Patient Stated Goal: Improved strength and endurance PT Goal Formulation: With patient Time For Goal Achievement: 06/20/17 Potential to Achieve Goals: Good    Frequency  Min 2X/week   Barriers to discharge        Co-evaluation               AM-PAC PT "6 Clicks" Daily Activity  Outcome Measure Difficulty turning over in bed (including adjusting bedclothes, sheets and blankets)?: None Difficulty moving from lying on back to sitting on the side of the bed? : None Difficulty sitting down on and standing up from a chair with arms (e.g., wheelchair, bedside commode, etc,.)?: A Little Help needed moving to and from a bed to chair (including a wheelchair)?: A Little Help needed walking in hospital room?: A Little Help needed climbing 3-5 steps with a railing? : A Little 6 Click Score: 20    End of Session Equipment Utilized During Treatment: Gait belt Activity Tolerance: Patient tolerated treatment well Patient left: in bed;with call bell/phone within reach(No bed alarm per pt, aware to call nsg with OOB activity) Nurse Communication: Mobility status PT Visit Diagnosis: Muscle weakness (generalized) (M62.81);Difficulty in walking, not elsewhere classified (R26.2)    Time: 9563-8756 PT Time Calculation (min) (ACUTE ONLY): 42 min   Charges:   PT Evaluation $PT Eval Low Complexity: 1 Low PT Treatments $Therapeutic Activity: 8-22 mins   PT G Codes:   PT G-Codes **NOT FOR INPATIENT CLASS** Functional Assessment Tool Used: AM-PAC 6 Clicks Basic Mobility Functional Limitation: Mobility: Walking and moving around  Mobility: Walking and Moving Around Current Status [phone]): At least 20 percent but less than 40 percent impaired, limited or restricted Mobility: Walking and Moving Around Goal Status [phone]): At least 1 percent but less than 20 percent impaired, limited or restricted    D. Scott Neida Ellegood PT, DPT 06/07/17, 3:52 PM

## 2017-06-07 NOTE — Progress Notes (Signed)
Per MD okay for RN to order saline nasal spray.

## 2017-06-07 NOTE — Progress Notes (Signed)
Early at [facility] NAME: Dennis [name]    MR#:  [id number]  DATE OF BIRTH:  Nov 09, 1945  SUBJECTIVE:   Patient came in with right lower extremity swelling and redness over the tibial shin and right foot. No fever.  Swelling going down. REVIEW OF SYSTEMS:   Review of Systems  Constitutional: Negative for chills, fever and weight loss.  HENT: Negative for ear discharge, ear pain and nosebleeds.   Eyes: Negative for blurred vision, pain and discharge.  Respiratory: Negative for sputum production, shortness of breath, wheezing and stridor.   Cardiovascular: Positive for leg swelling. Negative for chest pain, palpitations, orthopnea and PND.  Gastrointestinal: Negative for abdominal pain, diarrhea, nausea and vomiting.  Genitourinary: Negative for frequency and urgency.  Musculoskeletal: Negative for back pain and joint pain.  Skin: Positive for rash.  Neurological: Negative for sensory change, speech change, focal weakness and weakness.  Psychiatric/Behavioral: Negative for depression and hallucinations. The patient is not nervous/anxious.    Tolerating Diet:yes Tolerating PT: pending  DRUG ALLERGIES:   Allergies  Allergen Reactions  . Bee Venom Anaphylaxis  . Shrimp [Shellfish Allergy] Anaphylaxis and Other (See Comments)    "just shrimp"  . Stadol [Butorphanol] Anaphylaxis and Other (See Comments)    respiratory  Distress, couldn't breathe, cardiac arrest  . Wasp Venom Anaphylaxis    VITALS:  Blood pressure 121/64, pulse 65, temperature 97.6 F (36.4 C), temperature source Oral, resp. rate 20, height 6\' 7"  (2.007 m), weight (!) 165.6 kg (365 lb), SpO2 100 %.  PHYSICAL EXAMINATION:   Physical Exam  GENERAL:  71 y.o.-year-old patient lying in the bed with no acute distress.  EYES: Pupils equal, round, reactive to light and accommodation. No scleral icterus. Extraocular muscles intact.  HEENT: Head atraumatic, normocephalic.  Oropharynx and nasopharynx clear.  NECK:  Supple, no jugular venous distention. No thyroid enlargement, no tenderness.  LUNGS: Normal breath sounds bilaterally, no wheezing, rales, rhonchi. No use of accessory muscles of respiration.  CARDIOVASCULAR: S1, S2 normal. No murmurs, rubs, or gallops.  ABDOMEN: Soft, nontender, nondistended. Bowel sounds present. No organomegaly or mass.  EXTREMITIES: Right lower extremity edema with redness over the right tibial shin and foot.  A marked with a skin marker. NEUROLOGIC: Cranial nerves II through XII are intact. No focal Motor or sensory deficits b/l.   PSYCHIATRIC:  patient is alert and oriented x 3.  SKIN: No obvious rash, lesion, or ulcer.   LABORATORY PANEL:  CBC Recent Labs  Lab 06/07/17 0430  WBC 5.3  HGB 14.4  HCT 44.0  PLT 152    Chemistries  Recent Labs  Lab 06/06/17 1017 06/07/17 0430  NA 135 133*  K 3.5 4.1  CL 97* 95*  CO2 27 30  GLUCOSE 96 108*  BUN 13 14  CREATININE 1.08 1.11  CALCIUM 9.4 8.8*  AST 18  --   ALT <5*  --   ALKPHOS 58  --   BILITOT 2.2*  --    Cardiac Enzymes No results for input(s): TROPONINI in the last 168 hours. RADIOLOGY:  Dg Tibia/fibula Right  Result Date: 06/06/2017 CLINICAL DATA:  Cellulitis right lower extremity. EXAM: RIGHT TIBIA AND FIBULA - 2 VIEW COMPARISON:  None. FINDINGS: Soft tissue swelling diffusely throughout the right calf. No underlying acute bony abnormality. No fracture, subluxation or dislocation. No radiographic changes of osteomyelitis. Degenerative changes in the right knee. IMPRESSION: No acute bony abnormality. Electronically Signed   By: Lennette Bihari  [place] M.D.   On: 06/06/2017 11:30   US Venous Img Lower Unilateral Right  Result Date: 06/06/2017 CLINICAL DATA:  71 year old male with a history of right leg redness and swelling EXAM: RIGHT LOWER EXTREMITY VENOUS DOPPLER ULTRASOUND TECHNIQUE: Gray-scale sonography with graded compression, as well as color Doppler and duplex  ultrasound were performed to evaluate the lower extremity deep venous systems from the level of the common femoral vein and including the common femoral, femoral, profunda femoral, popliteal and calf veins including the posterior tibial, peroneal and gastrocnemius veins when visible. The superficial great saphenous vein was also interrogated. Spectral Doppler was utilized to evaluate flow at rest and with distal augmentation maneuvers in the common femoral, femoral and popliteal veins. COMPARISON:  None. FINDINGS: Contralateral Common Femoral Vein: Respiratory phasicity is normal and symmetric with the symptomatic side. No evidence of thrombus. Normal compressibility. Common Femoral Vein: No evidence of thrombus. Normal compressibility, respiratory phasicity and response to augmentation. Saphenofemoral Junction: No evidence of thrombus. Normal compressibility and flow on color Doppler imaging. Profunda Femoral Vein: No evidence of thrombus. Normal compressibility and flow on color Doppler imaging. Femoral Vein: No evidence of thrombus. Normal compressibility, respiratory phasicity and response to augmentation. Popliteal Vein: No evidence of thrombus. Normal compressibility, respiratory phasicity and response to augmentation. Calf Veins: No evidence of thrombus. Normal compressibility and flow on color Doppler imaging. Superficial Great Saphenous Vein: No evidence of thrombus. Normal compressibility and flow on color Doppler imaging. Other Findings:  None. IMPRESSION: Sonographic survey of the right lower extremity negative for DVT Electronically Signed   By: Corrie Mckusick D.O.   On: 06/06/2017 12:03   Dg Foot Complete Right  Result Date: 06/06/2017 CLINICAL DATA:  Cellulitis right lower extremity for 3 weeks EXAM: RIGHT FOOT COMPLETE - 3+ VIEW COMPARISON:  None. FINDINGS: Marked soft tissue swelling along the dorsum of the foot. Mild degenerative changes at the right first MTP joint. Plantar calcaneal spur. No  acute bony abnormality. No radiographic changes of osteomyelitis. IMPRESSION: Marked soft tissue swelling along the dorsum of the foot. No acute bony abnormality. Electronically Signed   By: Rolm Baptise M.D.   On: 06/06/2017 11:31   ASSESSMENT AND PLAN:  71 year old male with past medical history of testicular cancer, prostate cancer, Addison's disease, obstructive sleep apnea, Parkinson's disease, hypertension, hypothyroidism who presents to the hospital due to right lower extremity redness swelling and pain.  1. Right lower extremity cellulitis-this is the cause of patient's redness swelling and pain. -Dopplers of the lower extremities are negative for DVT.  -treat the patient with IV cefazolin follow clinically. Patient needs to keep his legs elevated and would benefit from compression stockings upon discharge.  -Clinically patient is afebrile and hemodynamically stable.  2. Essential hypertension-continue carvedilol, losartan.  3. History of chronic diastolic CHF-continue Lasix, carvedilol, losartan.  4. History of Parkinson's disease-continue Sinemet. Patient follows up with Dr. Manuella Ghazi.  5. Hypothyroidism-continue Synthroid.  6. History of Addison's disease-continue Solu-Cortef.  7. History of gout-no acute attack. Continue allopurinol.  8. Depression-continue Zoloft, Lexapro.  9. Hyperlipidemia-continue atorvastatin  Palliative care consultation placed per wife's request.  Physical therapy to see patient.  Care management for discharge planning  Case discussed with Care Management/Social Worker. Management plans discussed with the patient, family and they are in agreement.  CODE STATUS: Full  DVT Prophylaxis: Lovenox  TOTAL TIME TAKING CARE OF THIS PATIENT: *22* minutes.  >50% time spent on counselling and coordination of care  POSSIBLE D/C IN 1-2 DAYS, DEPENDING  ON CLINICAL CONDITION.  Note: This dictation was prepared with Dragon dictation along with smaller  phrase technology. Any transcriptional errors that result from this process are unintentional.  Fritzi Mandes M.D on 06/07/2017 at 12:42 PM  Between 7am to 6pm - Pager - [phone]  After 6pm go to [email or web address] - password [email or web address] [facility]  Office  [phone]  CC: Primary care physician; Mar Daring, PA-C

## 2017-06-07 NOTE — Consult Note (Signed)
Consultation Note Date: 06/08/2017   Patient Name: Dennis [name].  DOB: 10-08-1945  MRN: [id number]  Age / Sex: 71 y.o., male  PCP: Mar Daring, PA-C Referring Physician: Fritzi Mandes, MD  Reason for Consultation: Establishing goals of care Consult requested by wife.   HPI/Patient Profile: Dennis [name]  is a 71 y.o. male with a known history of previous CVA x11, MI x2, Addison's disease, history of testicular cancer, history of prostate cancer, gout, hypertension, hyperlipidemia, hypothyroidism, history of obstructive sleep apnea, and Parkinson's disease. Admitted for lower extremity cellulitis.    Clinical Assessment and Goals of Care: Patient is a retired Artist. He has a wife and one son. Patient's [name] states earlier this month, Nov 1st, Dennis [name] was evaluated by Neurology and diagnosed with stage 4 Parkinson's, she states the previous team thought the Addison's was the cause of his tremors. She states Neurology found the cause to be his previous numerous strokes. His wife states he has had a rapid spiral over the past few months. Dennis [name] has not been to a restaurant in 3 months, and states he puts his head down to the plate to eat because he can no longer get the food to his mouth due to shaking. She states he has lost weight as he is not eating more than a few bites at a time. His son stays with them to provide 24 hour assistance as needed. He is only able to walk through the house while leaning against walls. He uses a shower chair. Dennis [name] states Dennis [name] sees physicians at [facility] and is frustrated with their lack of support with payment. Discussed hospice vs aggressive care. They would like to give this thought and meet tomorrow morning at 9:00     [place] at 9:00 tomorrow.   Code Status/Advance Care Planning:  Full code    Symptom  Management:   On home medications for Parkinsons. No other symptoms.  Palliative Prophylaxis:   Bowel Regimen  Additional Recommendations (Limitations, Scope, Preferences):  Full Scope Treatment   Prognosis:   Unable to determine  Discharge Planning: To Be Determined      Primary Diagnoses: Present on Admission: . Cellulitis   I have reviewed the medical record, interviewed the patient and family, and examined the patient. The following aspects are pertinent.  Past Medical History:  Diagnosis Date  . Acute CVA (cerebrovascular accident) ([place]) 01/22/2016  . Addison disease ([place])   . Addison disease ([place]) 01/2016  . Addison's disease ([place]) 01/2016  . Arthritis    low back - DDD  . Cancer [facility])    Testicular Cancer  . Cellulitis of scrotum   . Cellulitis, scrotum 08/02/2014  . CHF (congestive heart failure) ([place])   . Chronic lower back pain    "from [place] 2007"  . Complication of anesthesia    Sometimes has N&V /w anesth.   . Coronary artery disease    Cath 2001  . Elevated PSA   .  Epididymitis, left 08/04/2014  . History of chronic bronchitis   . History of gout   . Hypertension   . Hypocholesteremia   . Hypothyroidism   . Kidney stone   . Myocardial infarction [facility]) 2001   2001- cardiac cath., cardiac clearanece note dr Otho Perl 05-14-13 on chart, stress test results 02-21-12 on chart  . OSA on CPAP    cpap setting of 10  . Pneumonia 2000's and 2013  . PONV (postoperative nausea and vomiting)   . [facility] spotted fever   . Stroke [facility]) 2004   "right brain stem; no residual "   Social History   Socioeconomic History  . Marital status: Married    Spouse name: None  . Number of children: 1  . Years of education: None  . Highest education level: None  Social Needs  . Financial resource strain: None  . Food insecurity - worry: None  . Food insecurity - inability: None  . Transportation needs - medical: None  . Transportation needs - non-medical: None   Occupational History  . Occupation: Retired-works at [employer]  . Smoking status: Never Smoker  . Smokeless tobacco: Never Used  Substance and Sexual Activity  . Alcohol use: Yes    Comment: " I drink wine about a year ago" 08/07/15  . Drug use: No  . Sexual activity: No  Other Topics Concern  . None  Social History Narrative  . None   Family History  Problem Relation Age of Onset  . Cervical cancer Mother   . Diabetes type II Mother   . Hypertension Mother   . Stroke Mother   . Heart attack Mother   . Dementia Father   . Diabetes type II Sister   . Hypertension Sister   . CAD Sister    Scheduled Meds: . allopurinol  100 mg Oral BID  . aspirin EC  81 mg Oral Daily  . atorvastatin  20 mg Oral q1800  . carbidopa-levodopa  2 tablet Oral QID  . carvedilol  3.125 mg Oral BID WC  . clopidogrel  75 mg Oral Daily  . enoxaparin (LOVENOX) injection  40 mg Subcutaneous Q12H  . furosemide  40 mg Oral Daily  . hydrocortisone  10 mg Oral Q supper  . hydrocortisone  20 mg Oral Q breakfast  . indomethacin  50 mg Oral TID WC  . levothyroxine  224 mcg Oral QAC breakfast  . losartan  100 mg Oral Daily  . mometasone-formoterol  2 puff Inhalation BID  . sertraline  100 mg Oral Daily   Continuous Infusions: .  ceFAZolin (ANCEF) IV Stopped (06/08/17 0655)   PRN Meds:.acetaminophen **OR** acetaminophen, albuterol, ondansetron **OR** ondansetron (ZOFRAN) IV, oxyCODONE-acetaminophen, polyethylene glycol, sodium chloride Medications Prior to Admission:  Prior to Admission medications   Medication Sig Start Date End Date Taking? Authorizing Provider  aspirin EC 81 MG tablet Take 81 mg by mouth daily.   Yes [provider]  atorvastatin (LIPITOR) 20 MG tablet TAKE 1 TABLET(20 MG) BY MOUTH DAILY 04/17/17  Yes Jerrol Banana., MD  carbidopa-levodopa (SINEMET IR) 25-250 MG tablet Take 2 tablets by mouth 4 (four) times daily.    Yes [provider]  carvedilol  (COREG) 3.125 MG tablet Take 3.125 mg by mouth 2 (two) times daily with a meal.   Yes [provider]  clopidogrel (PLAVIX) 75 MG tablet Take 75 mg by mouth daily.   Yes [provider]  furosemide (LASIX) 20  MG tablet Take 3 tablets (60 mg total) by mouth daily. Patient taking differently: Take 40 mg by mouth daily. 80mg  if needed for fluid retention 11/19/16  Yes Reyne Dumas, MD  hydrocortisone (CORTEF) 10 MG tablet 2 tablets in the morning and 1 tablet at 5 PM 05/04/17  Yes Elayne Snare, MD  indomethacin (INDOCIN) 50 MG capsule Take 50 mg by mouth 3 (three) times daily with meals.   Yes [provider]  levothyroxine (SYNTHROID, LEVOTHROID) 112 MCG tablet TAKE 2 TABLETS(224 MCG) BY MOUTH DAILY BEFORE BREAKFAST Patient taking differently: TAKE 2 TABLETS BY MOUTH EVERY MORNING AND 1 TABLET BY MOUTH EVERY EVENING 05/15/17  Yes Elayne Snare, MD  losartan (COZAAR) 100 MG tablet TAKE 1 TABLET(100 MG) BY MOUTH DAILY 05/03/17  Yes Jerrol Banana., MD  sertraline (ZOLOFT) 100 MG tablet Take 100 mg by mouth daily.   Yes [provider]  testosterone cypionate (DEPO-TESTOSTERONE) 200 MG/ML injection Inject 0.75 mLs (150 mg total) into the muscle every 14 (fourteen) days. 05/04/17  Yes Elayne Snare, MD  albuterol (PROVENTIL HFA;VENTOLIN HFA) 108 (90 Base) MCG/ACT inhaler Inhale 2 puffs into the lungs every 6 (six) hours as needed for wheezing or shortness of breath. 12/14/16   Vaughan Basta, MD  allopurinol (ZYLOPRIM) 100 MG tablet Take 1 tablet (100 mg total) by mouth 2 (two) times daily. 04/07/16   Jerrol Banana., MD  aspirin 325 MG tablet Take 1 tablet (325 mg total) by mouth daily. 11/18/16   Reyne Dumas, MD  ciprofloxacin (CIPRO) 500 MG tablet Take 500 mg by mouth 2 (two) times daily as needed.    [provider]  EPINEPHrine 0.3 mg/0.3 mL IJ SOAJ injection Inject 0.3 mLs (0.3 mg total) into the muscle as needed (allergic reaction). 12/15/15    Jerrol Banana., MD  guaiFENesin-dextromethorphan [facility] DM) 100-10 MG/5ML syrup Take 5 mLs by mouth every 4 (four) hours as needed for cough. Patient not taking: Reported on 05/27/2017 12/14/16   Vaughan Basta, MD  loratadine (CLARITIN) 10 MG tablet Take 1 tablet (10 mg total) by mouth daily. Patient not taking: Reported on 06/06/2017 03/07/17   Jerrol Banana., MD  mometasone-formoterol [facility]) 200-5 MCG/ACT AERO Inhale 2 puffs into the lungs 2 (two) times daily. 12/14/16   Vaughan Basta, MD  oxyCODONE-acetaminophen (PERCOCET) 5-325 MG tablet Take 2 tablets by mouth every 4 (four) hours as needed. 01/13/17   Charlesetta Shanks, MD  polyethylene glycol (MIRALAX / GLYCOLAX) packet Take 17 g by mouth daily as needed for mild constipation.  08/15/15   [provider]  sildenafil (REVATIO) 20 MG tablet Take 20 mg by mouth. 11/28/16   [provider]   Allergies  Allergen Reactions  . Bee Venom Anaphylaxis  . Shrimp [Shellfish Allergy] Anaphylaxis and Other (See Comments)    "just shrimp"  . Stadol [Butorphanol] Anaphylaxis and Other (See Comments)    respiratory  Distress, couldn't breathe, cardiac arrest  . Wasp Venom Anaphylaxis   Review of Systems  Physical Exam  Vital Signs: BP 123/66 (BP Location: Left Arm)   Pulse 65   Temp (!) 97.4 F (36.3 C) (Oral)   Resp 18   Ht 6\' 7"  (2.007 m)   Wt (!) 165.6 kg (365 lb)   SpO2 97%   BMI 41.12 kg/m  Pain Assessment: No/denies pain POSS *See Group Information*: S-Acceptable,Sleep, easy to arouse Pain Score: 0-No pain   SpO2: SpO2: 97 % O2 Device:SpO2: 97 % O2 Flow  Rate: .   IO: Intake/output summary:   Intake/Output Summary (Last 24 hours) at 06/08/2017 0751 Last data filed at 06/08/2017 0744 Gross per 24 hour  Intake 943 ml  Output 1250 ml  Net -307 ml    LBM: Last BM Date: 06/05/17 Baseline Weight: Weight: (!) 165.6 kg (365 lb) Most recent weight: Weight: (!) 165.6 kg (365 lb)      Palliative Assessment/Data:     Time In: 4:00 Time Out: 5:10 Time Total: 70 min Greater than 50%  of this time was spent counseling and coordinating care related to the above assessment and plan.  Signed by: Asencion Gowda, NP 06/08/2017 8:05 AM Office: [phone] 7am-7pm  Please see Amion for pager number and availability  Call primary team after hours  Please contact Palliative Medicine Team phone at [phone] for questions and concerns.  For individual provider: See Shea Evans

## 2017-06-07 NOTE — Consult Note (Signed)
   [facility] CM Inpatient Consult   06/07/2017  Dennis Rezendes Yackley Sr. [birth year] [id number]   Received a message from inpatient case manager about this patient. Electronic medical record reveals this patient is eligible  for [place] Management services and post hospital discharge follow up related to a diagnosis of HF and having 2 admits and 3 ED visits in the last 6 months. Patient was evaluated for community based chronic disease management services with [facility] care Management Program as a benefit of patient's NextGen Medicare. Met with the patient at the bedside to explain [place] Management services. 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 Patient is agreeable to services. Verbal consent obtained. Patient gave [phone] as the best number to reach him. He also gave written permission to call his spouse [name] at [phone] if he can not be reached. Patient will receive post hospital discharge calls and be evaluated for monthly home visits. [facility] Care Management services does not interfere with or replace any services arranged by the inpatient care management team. RNCM left contact information and [facility] literature at the bedside. Made inpatient RNCM aware that [facility] will be following for care management. For additional questions please contact:   Lucianna Ostlund RN, [facility] Liaison  [phone]) Business Mobile [phone]) Toll free office

## 2017-06-07 NOTE — Care Management (Addendum)
Patient affiliated with [facility].  Refusal to transfer faxed to [facility] and [place].  Hard copy placed in chart.  Patient lives at home with wife.  Ambulates with a cane at baseline.  Patient states that he has a cane, RW, nebulizer, and CPAP in the home.  PCP Rosanna Randy.  Patient was referred to [place] previous admission but did not follow up, states "there isn't anything wrong with my lungs".  Patient has been opened with [facility] in the past, and would like to use them again if indicated.  PT consult pending.  Patient's wife has called and requested that [facility] call.  RNCM called and VM was left.  Patient states "it's to see about someone coming out to the house to help me". Patient was previously referred to [facility] in June of 2018.  Message left for Janci to confirm if patient opend.  RNCM following.

## 2017-06-08 DIAGNOSIS — L03115 Cellulitis of right lower limb: Principal | ICD-10-CM

## 2017-06-08 DIAGNOSIS — E271 Primary adrenocortical insufficiency: Secondary | ICD-10-CM

## 2017-06-08 DIAGNOSIS — Z7189 Other specified counseling: Secondary | ICD-10-CM

## 2017-06-08 DIAGNOSIS — G20 Parkinson's disease: Secondary | ICD-10-CM

## 2017-06-08 MED ORDER — CEPHALEXIN 500 MG PO CAPS: 500.0000 mg | ORAL_CAPSULE | Freq: Three times a day (TID) | ORAL | 0 refills | Status: DC

## 2017-06-08 MED ORDER — CEPHALEXIN 500 MG PO CAPS
500.0000 mg | ORAL_CAPSULE | Freq: Three times a day (TID) | ORAL | Status: DC
  Administered 2017-06-08: 500 mg via ORAL
  Filled 2017-06-08: qty 1

## 2017-06-08 MED ORDER — FUROSEMIDE 20 MG PO TABS: 40.0000 mg | ORAL_TABLET | Freq: Every day | ORAL | 0 refills | Status: DC

## 2017-06-08 NOTE — Progress Notes (Signed)
Nolon Nations Flud Sr.  A and O x 4. VSS. Pt tolerating diet well. No complaints of pain or nausea. IV removed intact, prescriptions given. Pt voiced understanding of discharge instructions with no further questions. Pt discharged via wheelchair.     Allergies as of 06/08/2017      Reactions   Bee Venom Anaphylaxis   Shrimp [shellfish Allergy] Anaphylaxis, Other (See Comments)   "just shrimp"   Stadol [butorphanol] Anaphylaxis, Other (See Comments)   respiratory  Distress, couldn't breathe, cardiac arrest   Wasp Venom Anaphylaxis      Medication List    TAKE these medications   albuterol 108 (90 Base) MCG/ACT inhaler Commonly known as:  PROVENTIL HFA;VENTOLIN HFA Inhale 2 puffs into the lungs every 6 (six) hours as needed for wheezing or shortness of breath.   allopurinol 100 MG tablet Commonly known as:  ZYLOPRIM Take 1 tablet (100 mg total) by mouth 2 (two) times daily.   aspirin EC 81 MG tablet Take 81 mg by mouth daily. What changed:  Another medication with the same name was removed. Continue taking this medication, and follow the directions you see here.   atorvastatin 20 MG tablet Commonly known as:  LIPITOR TAKE 1 TABLET(20 MG) BY MOUTH DAILY   carbidopa-levodopa 25-250 MG tablet Commonly known as:  SINEMET IR Take 2 tablets by mouth 4 (four) times daily.   carvedilol 3.125 MG tablet Commonly known as:  COREG Take 3.125 mg by mouth 2 (two) times daily with a meal.   cephALEXin 500 MG capsule Commonly known as:  KEFLEX Take 1 capsule (500 mg total) by mouth every 8 (eight) hours.   ciprofloxacin 500 MG tablet Commonly known as:  CIPRO Take 500 mg by mouth 2 (two) times daily as needed.   clopidogrel 75 MG tablet Commonly known as:  PLAVIX Take 75 mg by mouth daily.   EPINEPHrine 0.3 mg/0.3 mL Soaj injection Commonly known as:  EPI-PEN Inject 0.3 mLs (0.3 mg total) into the muscle as needed (allergic reaction).   furosemide 20 MG tablet Commonly known as:   LASIX Take 2 tablets (40 mg total) by mouth daily. What changed:  how much to take   guaiFENesin-dextromethorphan 100-10 MG/5ML syrup Commonly known as:  ROBITUSSIN DM Take 5 mLs by mouth every 4 (four) hours as needed for cough.   hydrocortisone 10 MG tablet Commonly known as:  CORTEF 2 tablets in the morning and 1 tablet at 5 PM   indomethacin 50 MG capsule Commonly known as:  INDOCIN Take 50 mg by mouth 3 (three) times daily with meals.   levothyroxine 112 MCG tablet Commonly known as:  SYNTHROID, LEVOTHROID TAKE 2 TABLETS(224 MCG) BY MOUTH DAILY BEFORE BREAKFAST What changed:  See the new instructions.   loratadine 10 MG tablet Commonly known as:  CLARITIN Take 1 tablet (10 mg total) by mouth daily.   losartan 100 MG tablet Commonly known as:  COZAAR TAKE 1 TABLET(100 MG) BY MOUTH DAILY   mometasone-formoterol 200-5 MCG/ACT Aero Commonly known as:  DULERA Inhale 2 puffs into the lungs 2 (two) times daily.   oxyCODONE-acetaminophen 5-325 MG tablet Commonly known as:  PERCOCET Take 2 tablets by mouth every 4 (four) hours as needed.   polyethylene glycol packet Commonly known as:  MIRALAX / GLYCOLAX Take 17 g by mouth daily as needed for mild constipation.   sertraline 100 MG tablet Commonly known as:  ZOLOFT Take 100 mg by mouth daily.   sildenafil 20 MG tablet  Commonly known as:  REVATIO Take 20 mg by mouth.   testosterone cypionate 200 MG/ML injection Commonly known as:  DEPO-TESTOSTERONE Inject 0.75 mLs (150 mg total) into the muscle every 14 (fourteen) days.       Vitals:   06/08/17 0739 06/08/17 1033  BP: 123/66 135/76  Pulse: 65 66  Resp: 18   Temp: (!) 97.4 F (36.3 C)   SpO2: 97% 97%    Francesco Sor

## 2017-06-08 NOTE — Clinical Social Work Note (Signed)
Clinical Social Work Assessment  Patient Details  Name: Dennis [name] Sr. MRN: [id number] Date of Birth: October 25, 1945  Date of referral:  06/08/17               Reason for consult:  Family Concerns                Permission sought to share information with:    Permission granted to share information::     Name::        Agency::     Relationship::     Contact Information:     Housing/Transportation Living arrangements for the past 2 months:    Source of Information:    Patient Interpreter Needed:    Criminal Activity/Legal Involvement Pertinent to Current Situation/Hospitalization:    Significant Relationships:    Lives with:    Do you feel safe going back to the place where you live?    Need for family participation in patient care:     Care giving concerns:  Patient resides with his spouse and son.    Social Worker assessment / plan:  CSW became involved today on the day of discharge due to Palliative's discussion with patient and wife and them wanting nursing home placement. RN CM had spoken to patient and his wife regarding services that were ordered by the physician for home. Please refer to RN CM documentation of that interaction.  After the RN CM left patient's room and updated Palliative Care, Palliative Care went back to patient's room and this CSW went in the room with her. Patient's wife was immediately belligerent and and upset regarding patient's discharge. Patient's wife demanded a second opinion and voiced her dissatisfaction regarding many areas. Patient's wife was able to vocalize that she was aware that patient had not had a 3 night stay and that she knew that if he went to a rehab facility it would be out of pocket. She then began voicing again that she wanted a second opinion surrounding patient's ability to discharge today. She began to go off on tangents about how her son takes care of everything for patient and discussed encounters surrounding patient's previous  medical visits. She brought up the [place] and not being able to get services from them.   CSW attempted to bring the conversation and thoughts back into focus of patient's wife's original intent and that intent was for her to get a second opinion regarding patient's discharge today. Patient's wife kept going back and forth about demanding a second opinion then declining one. She ultimately was able to say "I don't know, ask my husband." CSW spoke with patient and he stated that he would be in agreement with this.   CSW was able to obtain Dr. Manuella Ghazi who was willing to see patient. Patient's wife had left for a few minutes but returned. CSW went into patient's room with Dr. Roena Malady. Posey Pronto came in the room a few minutes later) and he explained that medically patient was stable for discharge. He offered for PT to return to see him again but patient was aware that this would not change the fact that their opinion would not change the fact that he had not had a 3 night qualifying inpatient stay. At this time, it was noted by Dr. Posey Pronto and this CSW that patient's wife was recording everyone's discussion. When asked, she initially lied and stated she was not recording but when CSW pointed out the second phone she had, she confessed to recording without  permission. Patient's wife became belligerent again and patient told his wife several times to calm down and to be quiet but she would not listen. She demanded that his discharge paperwork be done immediately. At this time CSW, and both Dr. Manuella Ghazi and Dr. Posey Pronto exited the room.  Employment status:    Insurance information:    PT Recommendations:    Information / Referral to community resources:     Patient/Family's Response to care:  Patient was calm and understanding. Patient's wife was belligerent and cursing.  Patient/Family's Understanding of and Emotional Response to Diagnosis, Current Treatment, and Prognosis:  Patient was becoming frustrated with his wife's  behavior and attempted to tell her to calm down several times and to be quiet but she did not.  Emotional Assessment Appearance:    Attitude/Demeanor/Rapport:    Affect (typically observed):  Calm Orientation:  Oriented to Self, Oriented to Place, Oriented to  Time, Oriented to Situation, Fluctuating Orientation (Suspected and/or reported Sundowners) Alcohol / Substance use:  Not Applicable Psych involvement (Current and /or in the community):  No (Comment)  Discharge Needs  Concerns to be addressed:  Other (Comment Required(family issues/dynamics) Readmission within the last 30 days:  No Current discharge risk:  None Barriers to Discharge:  No Barriers Identified   Shela Leff, LCSW 06/08/2017, 12:06 PM

## 2017-06-08 NOTE — Discharge Summary (Signed)
[facility] [place] NAME: Dennis [name]    MR#:  [id number]  DATE OF BIRTH:  05-25-1946  DATE OF ADMISSION:  06/06/2017 ADMITTING PHYSICIAN: Henreitta Leber, MD  DATE OF DISCHARGE: 06/08/2017  PRIMARY CARE PHYSICIAN: Mar Daring, PA-C    ADMISSION DIAGNOSIS:  Cellulitis of right lower extremity [F62.130]  DISCHARGE DIAGNOSIS:  Right lower extremity cellulitis and swelling  SECONDARY DIAGNOSIS:   Past Medical History:  Diagnosis Date  . Acute CVA (cerebrovascular accident) ([place]) 01/22/2016  . Addison disease ([place])   . Addison disease ([place]) 01/2016  . Addison's disease ([place]) 01/2016  . Arthritis    low back - DDD  . Cancer [facility])    Testicular Cancer  . Cellulitis of scrotum   . Cellulitis, scrotum 08/02/2014  . CHF (congestive heart failure) ([place])   . Chronic lower back pain    "from [place] 2007"  . Complication of anesthesia    Sometimes has N&V /w anesth.   . Coronary artery disease    Cath 2001  . Elevated PSA   . Epididymitis, left 08/04/2014  . History of chronic bronchitis   . History of gout   . Hypertension   . Hypocholesteremia   . Hypothyroidism   . Kidney stone   . Myocardial infarction [facility]) 2001   2001- cardiac cath., cardiac clearanece note dr Otho Perl 05-14-13 on chart, stress test results 02-21-12 on chart  . OSA on CPAP    cpap setting of 10  . Pneumonia 2000's and 2013  . PONV (postoperative nausea and vomiting)   . [facility] spotted fever   . Stroke [facility]) 2004   "right brain stem; no residual "    HOSPITAL COURSE:   71 year old male with past medical history of testicular cancer, prostate cancer, Addison's disease, obstructive sleep apnea, Parkinson's disease, hypertension, hypothyroidism who presents to the hospital due to right lower extremity redness swelling and pain.  1. Right lower extremity cellulitis-this is the cause of patient's redness swelling and pain. -Dopplers of the  lower extremities are negative for DVT.  -treat the patient with IV cefazolin follow clinically.--change to oral keflex 500 mg tid. - Patient needs to keep his legs elevated and would benefit from compression stockings upon discharge if able to place them.  -addressed importance of leg elevation when at rest/ -No fever, BC negative in 2 days, redness improving (not extended beyond the skin marking line) more darker hue, WBC normal -Clinically patient is afebrile and hemodynamically stable.  2. Essential hypertension-continue carvedilol, losartan.  3. History of chronic diastolic CHF-continue Lasix, carvedilol, losartan. -not in HF  4. History of Parkinson's disease-continue Sinemet. Patient follows up with Dr. Manuella Ghazi.  5. Hypothyroidism-continue Synthroid.  6. History of Addison's disease-continue Solu-Cortef.  7. History of gout-no acute attack. Continue allopurinol.  8. Depression-continue Zoloft,Lexapro.  9. Hyperlipidemia-continue atorvastatin  Physical therapy recommends HHPT which will be arranged. Palliative care met with pt and family  CONSULTS OBTAINED:    DRUG ALLERGIES:   Allergies  Allergen Reactions  . Bee Venom Anaphylaxis  . Shrimp [Shellfish Allergy] Anaphylaxis and Other (See Comments)    "just shrimp"  . Stadol [Butorphanol] Anaphylaxis and Other (See Comments)    respiratory  Distress, couldn't breathe, cardiac arrest  . Wasp Venom Anaphylaxis    DISCHARGE MEDICATIONS:   Allergies as of 06/08/2017      Reactions   Bee Venom Anaphylaxis   Shrimp [shellfish Allergy] Anaphylaxis, Other (See Comments)   "  just shrimp"   Stadol [butorphanol] Anaphylaxis, Other (See Comments)   respiratory  Distress, couldn't breathe, cardiac arrest   Wasp Venom Anaphylaxis      Medication List    TAKE these medications   albuterol 108 (90 Base) MCG/ACT inhaler Commonly known as:  PROVENTIL HFA;VENTOLIN HFA Inhale 2 puffs into the lungs every 6 (six)  hours as needed for wheezing or shortness of breath.   allopurinol 100 MG tablet Commonly known as:  ZYLOPRIM Take 1 tablet (100 mg total) by mouth 2 (two) times daily.   aspirin EC 81 MG tablet Take 81 mg by mouth daily. What changed:  Another medication with the same name was removed. Continue taking this medication, and follow the directions you see here.   atorvastatin 20 MG tablet Commonly known as:  LIPITOR TAKE 1 TABLET(20 MG) BY MOUTH DAILY   carbidopa-levodopa 25-250 MG tablet Commonly known as:  SINEMET IR Take 2 tablets by mouth 4 (four) times daily.   carvedilol 3.125 MG tablet Commonly known as:  COREG Take 3.125 mg by mouth 2 (two) times daily with a meal.   cephALEXin 500 MG capsule Commonly known as:  KEFLEX Take 1 capsule (500 mg total) by mouth every 8 (eight) hours.   ciprofloxacin 500 MG tablet Commonly known as:  CIPRO Take 500 mg by mouth 2 (two) times daily as needed.   clopidogrel 75 MG tablet Commonly known as:  PLAVIX Take 75 mg by mouth daily.   EPINEPHrine 0.3 mg/0.3 mL So[place] injection Commonly known as:  EPI-PEN Inject 0.3 mLs (0.3 mg total) into the muscle as needed (allergic reaction).   furosemide 20 MG tablet Commonly known as:  LASIX Take 2 tablets (40 mg total) by mouth daily. What changed:  how much to take   guaiFENesin-dextromethorphan 100-10 MG/5ML syrup Commonly known as:  ROBITUSSIN DM Take 5 mLs by mouth every 4 (four) hours as needed for cough.   hydrocortisone 10 MG tablet Commonly known as:  CORTEF 2 tablets in the morning and 1 tablet at 5 PM   indomethacin 50 MG capsule Commonly known as:  INDOCIN Take 50 mg by mouth 3 (three) times daily with meals.   levothyroxine 112 MCG tablet Commonly known as:  SYNTHROID, LEVOTHROID TAKE 2 TABLETS(224 MCG) BY MOUTH DAILY BEFORE BREAKFAST What changed:  See the new instructions.   loratadine 10 MG tablet Commonly known as:  CLARITIN Take 1 tablet (10 mg total) by mouth  daily.   losartan 100 MG tablet Commonly known as:  COZAAR TAKE 1 TABLET(100 MG) BY MOUTH DAILY   mometasone-formoterol 200-5 MCG/ACT Aero Commonly known as:  DULERA Inhale 2 puffs into the lungs 2 (two) times daily.   oxyCODONE-acetaminophen 5-325 MG tablet Commonly known as:  PERCOCET Take 2 tablets by mouth every 4 (four) hours as needed.   polyethylene glycol packet Commonly known as:  MIRALAX / GLYCOLAX Take 17 g by mouth daily as needed for mild constipation.   sertraline 100 MG tablet Commonly known as:  ZOLOFT Take 100 mg by mouth daily.   sildenafil 20 MG tablet Commonly known as:  REVATIO Take 20 mg by mouth.   testosterone cypionate 200 MG/ML injection Commonly known as:  DEPO-TESTOSTERONE Inject 0.75 mLs (150 mg total) into the muscle every 14 (fourteen) days.       If you experience worsening of your admission symptoms, develop shortness of breath, life threatening emergency, suicidal or homicidal thoughts you must seek medical attention immediately by calling 911  or calling your MD immediately  if symptoms less severe.  You Must read complete instructions/literature along with all the possible adverse reactions/side effects for all the Medicines you take and that have been prescribed to you. Take any new Medicines after you have completely understood and accept all the possible adverse reactions/side effects.   Please note  You were cared for by a hospitalist during your hospital stay. If you have any questions about your discharge medications or the care you received while you were in the hospital after you are discharged, you can call the unit and asked to speak with the hospitalist on call if the hospitalist that took care of you is not available. Once you are discharged, your primary care physician will handle any further medical issues. Please note that NO REFILLS for any discharge medications will be authorized once you are discharged, as it is imperative  that you return to your primary care physician (or establish a relationship with a primary care physician if you do not have one) for your aftercare needs so that they can reassess your need for medications and monitor your lab values. Today   SUBJECTIVE   Right LE swelling improving-slowly No fever  VITAL SIGNS:  Blood pressure 135/76, pulse 66, temperature (!) 97.4 F (36.3 C), temperature source Oral, resp. rate 18, height 6' 7"  (2.007 m), weight (!) 165.6 kg (365 lb), SpO2 97 %.  I/O:    Intake/Output Summary (Last 24 hours) at 06/08/2017 1143 Last data filed at 06/08/2017 0943 Gross per 24 hour  Intake 823 ml  Output 1250 ml  Net -427 ml    PHYSICAL EXAMINATION:  GENERAL:  71 y.o.-year-old patient lying in the bed with no acute distress. obese EYES: Pupils equal, round, reactive to light and accommodation. No scleral icterus. Extraocular muscles intact.  HEENT: Head atraumatic, normocephalic. Oropharynx and nasopharynx clear.  NECK:  Supple, no jugular venous distention. No thyroid enlargement, no tenderness.  LUNGS: Normal breath sounds bilaterally, no wheezing, rales,rhonchi or crepitation. No use of accessory muscles of respiration.  CARDIOVASCULAR: S1, S2 normal. No murmurs, rubs, or gallops.  ABDOMEN: Soft, non-tender, non-distended. Bowel sounds present. No organomegaly or mass.  EXTREMITIES: right lowerextremity pedal edema +,no cyanosis, or clubbing. Redness more darker and skin temp normal. Not extended beyond the skin marking lline -dry web spaces NEUROLOGIC: Cranial nerves II through XII are intact. Muscle strength 4+/5 in all extremities. Sensation intact. Gait not checked.  PSYCHIATRIC: The patient is alert and oriented x 3.  SKIN: No obvious rash, lesion, or ulcer.   DATA REVIEW:   CBC  Recent Labs  Lab 06/07/17 0430  WBC 5.3  HGB 14.4  HCT 44.0  PLT 152    Chemistries  Recent Labs  Lab 06/06/17 1017 06/07/17 0430  NA 135 133*  K 3.5 4.1  CL  97* 95*  CO2 27 30  GLUCOSE 96 108*  BUN 13 14  CREATININE 1.08 1.11  CALCIUM 9.4 8.8*  AST 18  --   ALT <5*  --   ALKPHOS 58  --   BILITOT 2.2*  --     Microbiology Results   Recent Results (from the past 240 hour(s))  Culture, blood (routine x 2)     Status: None (Preliminary result)   Collection Time: 06/06/17 10:17 AM  Result Value Ref Range Status   Specimen Description BLOOD RIGHT ANTECUBITAL  Final   Special Requests   Final    BOTTLES DRAWN AEROBIC AND ANAEROBIC Blood  Culture adequate volume   Culture NO GROWTH 2 DAYS  Final   Report Status PENDING  Incomplete  Culture, blood (routine x 2)     Status: None (Preliminary result)   Collection Time: 06/06/17 11:14 AM  Result Value Ref Range Status   Specimen Description BLOOD RIGHT ANTECUBITAL  Final   Special Requests   Final    BOTTLES DRAWN AEROBIC AND ANAEROBIC Blood Culture adequate volume   Culture NO GROWTH 2 DAYS  Final   Report Status PENDING  Incomplete    RADIOLOGY:  US Venous Img Lower Unilateral Right  Result Date: 06/06/2017 CLINICAL DATA:  71 year old male with a history of right leg redness and swelling EXAM: RIGHT LOWER EXTREMITY VENOUS DOPPLER ULTRASOUND TECHNIQUE: Gray-scale sonography with graded compression, as well as color Doppler and duplex ultrasound were performed to evaluate the lower extremity deep venous systems from the level of the common femoral vein and including the common femoral, femoral, profunda femoral, popliteal and calf veins including the posterior tibial, peroneal and gastrocnemius veins when visible. The superficial great saphenous vein was also interrogated. Spectral Doppler was utilized to evaluate flow at rest and with distal augmentation maneuvers in the common femoral, femoral and popliteal veins. COMPARISON:  None. FINDINGS: Contralateral Common Femoral Vein: Respiratory phasicity is normal and symmetric with the symptomatic side. No evidence of thrombus. Normal  compressibility. Common Femoral Vein: No evidence of thrombus. Normal compressibility, respiratory phasicity and response to augmentation. Saphenofemoral Junction: No evidence of thrombus. Normal compressibility and flow on color Doppler imaging. Profunda Femoral Vein: No evidence of thrombus. Normal compressibility and flow on color Doppler imaging. Femoral Vein: No evidence of thrombus. Normal compressibility, respiratory phasicity and response to augmentation. Popliteal Vein: No evidence of thrombus. Normal compressibility, respiratory phasicity and response to augmentation. Calf Veins: No evidence of thrombus. Normal compressibility and flow on color Doppler imaging. Superficial Great Saphenous Vein: No evidence of thrombus. Normal compressibility and flow on color Doppler imaging. Other Findings:  None. IMPRESSION: Sonographic survey of the right lower extremity negative for DVT Electronically Signed   By: Corrie Mckusick D.O.   On: 06/06/2017 12:03     Management plans discussed with the patient, family and they are in agreement.  CODE STATUS:     Code Status Orders  (From admission, onward)        Start     Ordered   06/06/17 1403  Full code  Continuous     06/06/17 1402    Code Status History    Date Active Date Inactive Code Status Order ID Comments User Context   12/12/2016 00:55 12/14/2016 19:38 Full Code [id number]  Lance Coon, MD Inpatient   11/15/2016 12:43 11/17/2016 17:02 Full Code [id number]  Waldemar Dickens, MD ED   09/02/2016 15:57 09/03/2016 17:33 Full Code [id number]  Eustace Moore, MD Inpatient   08/23/2016 11:13 09/02/2016 15:57 Full Code [id number]  Eustace Moore, MD Inpatient   03/18/2016 00:37 03/19/2016 15:51 Full Code [id number]  Lily Kocher, MD Inpatient   01/27/2016 16:33 01/28/2016 19:03 Full Code [id number]  Evans Lance, MD Inpatient   01/25/2016 20:47 01/27/2016 16:33 Full Code [id number]  Verner Mould, MD Inpatient   01/21/2016 19:07 01/23/2016 15:20 Full Code  [id number]  Elberta Leatherwood, MD Inpatient   08/14/2015 02:27 08/15/2015 15:49 Full Code [id number]  Saundra Shelling, MD ED   08/04/2014 18:26 08/07/2014 21:55 Full Code [id number]  [name], MD Inpatient   08/03/2014 01:03 08/03/2014 16:32 Full Code  [id number]  Toy Baker, MD Inpatient   07/17/2013 17:20 07/19/2013 13:25 Full Code [id number]  Eustace Moore, MD Inpatient    Advance Directive Documentation     Most Recent Value  Type of Advance Directive  Out of facility DNR (pink MOST or yellow form), Healthcare Power of Attorney  Pre-existing out of facility DNR order (yellow form or pink MOST form)  No data  "MOST" Form in Place?  No data      TOTAL TIME TAKING CARE OF THIS PATIENT: 40 minutes.    Fritzi Mandes M.D on 06/08/2017 at 11:43 AM  Between 7am to 6pm - Pager - [phone] After 6pm go to [email or web address] - password [email or web address] [facility]  Office  [phone]  CC: Primary care physician; Mar Daring, PA-C

## 2017-06-08 NOTE — Care Management (Signed)
Received return call from wife [name].  Wife states that patient will require services at home after discharge.  PT consult pending that this time, and wife notified that after PT consult completed we would be able to deterime which services are appropriate.  Wife in agreement with using [facility] if indicated at discharge. Wife states they are in the process of obtaining a U shaped walker from the [place], but they haven't received it yet.  Wife requested palliative consult.  Order obtained from MD.  Wife requested PCS services list.  RNCM notified that a copy would be left at the bedside. Will proceed with discharge disposition after PT consult completed

## 2017-06-08 NOTE — Discharge Summary (Deleted)
[name] [place] NAME: Dennis [name]    MR#:  [id number]  DATE OF BIRTH:  17-May-1946  DATE OF ADMISSION:  06/06/2017 ADMITTING PHYSICIAN: Henreitta Leber, MD  DATE OF DISCHARGE: 06/08/2017  PRIMARY CARE PHYSICIAN: Mar Daring, PA-C    ADMISSION DIAGNOSIS:  Cellulitis of right lower extremity [Q65.784]  DISCHARGE DIAGNOSIS:  Right lower extremity cellulitis and swelling  SECONDARY DIAGNOSIS:   Past Medical History:  Diagnosis Date  . Acute CVA (cerebrovascular accident) ([place]) 01/22/2016  . Addison disease ([place])   . Addison disease ([place]) 01/2016  . Addison's disease ([place]) 01/2016  . Arthritis    low back - DDD  . Cancer [facility])    Testicular Cancer  . Cellulitis of scrotum   . Cellulitis, scrotum 08/02/2014  . CHF (congestive heart failure) ([place])   . Chronic lower back pain    "from [place] 2007"  . Complication of anesthesia    Sometimes has N&V /w anesth.   . Coronary artery disease    Cath 2001  . Elevated PSA   . Epididymitis, left 08/04/2014  . History of chronic bronchitis   . History of gout   . Hypertension   . Hypocholesteremia   . Hypothyroidism   . Kidney stone   . Myocardial infarction [facility]) 2001   2001- cardiac cath., cardiac clearanece note dr Otho Perl 05-14-13 on chart, stress test results 02-21-12 on chart  . OSA on CPAP    cpap setting of 10  . Pneumonia 2000's and 2013  . PONV (postoperative nausea and vomiting)   . [facility] spotted fever   . Stroke [facility]) 2004   "right brain stem; no residual "    HOSPITAL COURSE:   71 year old male with past medical history of testicular cancer, prostate cancer, Addison's disease, obstructive sleep apnea, Parkinson's disease, hypertension, hypothyroidism who presents to the hospital due to right lower extremity redness swelling and pain.  1. Right lower extremity cellulitis-this is the cause of patient's redness swelling and pain. -Dopplers of the  lower extremities are negative for DVT.  -treat the patient with IV cefazolin follow clinically.--change to oral keflex 500 mg tid. - Patient needs to keep his legs elevated and would benefit from compression stockings upon discharge if able to place them.  -addressed importance of leg elevation when at rest/ -No fever, BC negative in 2 days, redness improving (not extended beyond the skin marking line) more darker hue, WBC normal -Clinically patient is afebrile and hemodynamically stable.  2. Essential hypertension-continue carvedilol, losartan.  3. History of chronic diastolic CHF-continue Lasix, carvedilol, losartan. -not in HF  4. History of Parkinson's disease-continue Sinemet. Patient follows up with Dr. Manuella Ghazi.  5. Hypothyroidism-continue Synthroid.  6. History of Addison's disease-continue Solu-Cortef.  7. History of gout-no acute attack. Continue allopurinol.  8. Depression-continue Zoloft,Lexapro.  9. Hyperlipidemia-continue atorvastatin  Spoke with wife and son in the room. Physical therapy recommends HHPT which will be arranged. Palliative care met with pt and family  CONSULTS OBTAINED:    DRUG ALLERGIES:   Allergies  Allergen Reactions  . Bee Venom Anaphylaxis  . Shrimp [Shellfish Allergy] Anaphylaxis and Other (See Comments)    "just shrimp"  . Stadol [Butorphanol] Anaphylaxis and Other (See Comments)    respiratory  Distress, couldn't breathe, cardiac arrest  . Wasp Venom Anaphylaxis    DISCHARGE MEDICATIONS:     If you experience worsening of your admission symptoms, develop shortness of breath, life threatening  emergency, suicidal or homicidal thoughts you must seek medical attention immediately by calling 911 or calling your MD immediately  if symptoms less severe.  You Must read complete instructions/literature along with all the possible adverse reactions/side effects for all the Medicines you take and that have been prescribed to you. Take  any new Medicines after you have completely understood and accept all the possible adverse reactions/side effects.   Please note  You were cared for by a hospitalist during your hospital stay. If you have any questions about your discharge medications or the care you received while you were in the hospital after you are discharged, you can call the unit and asked to speak with the hospitalist on call if the hospitalist that took care of you is not available. Once you are discharged, your primary care physician will handle any further medical issues. Please note that NO REFILLS for any discharge medications will be authorized once you are discharged, as it is imperative that you return to your primary care physician (or establish a relationship with a primary care physician if you do not have one) for your aftercare needs so that they can reassess your need for medications and monitor your lab values. Today   SUBJECTIVE   Right LE swelling improving-slowly No fever  VITAL SIGNS:  Blood pressure 135/76, pulse 66, temperature (!) 97.4 F (36.3 C), temperature source Oral, resp. rate 18, height 6' 7" (2.007 m), weight (!) 165.6 kg (365 lb), SpO2 97 %.  I/O:    Intake/Output Summary (Last 24 hours) at 06/08/2017 1150 Last data filed at 06/08/2017 0943 Gross per 24 hour  Intake 823 ml  Output 1250 ml  Net -427 ml    PHYSICAL EXAMINATION:  GENERAL:  71 y.o.-year-old patient lying in the bed with no acute distress. obese EYES: Pupils equal, round, reactive to light and accommodation. No scleral icterus. Extraocular muscles intact.  HEENT: Head atraumatic, normocephalic. Oropharynx and nasopharynx clear.  NECK:  Supple, no jugular venous distention. No thyroid enlargement, no tenderness.  LUNGS: Normal breath sounds bilaterally, no wheezing, rales,rhonchi or crepitation. No use of accessory muscles of respiration.  CARDIOVASCULAR: S1, S2 normal. No murmurs, rubs, or gallops.  ABDOMEN: Soft,  non-tender, non-distended. Bowel sounds present. No organomegaly or mass.  EXTREMITIES: right lowerextremity pedal edema +,no cyanosis, or clubbing. Redness more darker and skin temp normal. Not extended beyond the skin marking lline -dry web spaces NEUROLOGIC: Cranial nerves II through XII are intact. Muscle strength 4+/5 in all extremities. Sensation intact. Gait not checked.  PSYCHIATRIC: The patient is alert and oriented x 3.  SKIN: No obvious rash, lesion, or ulcer.   DATA REVIEW:   CBC  Recent Labs  Lab 06/07/17 0430  WBC 5.3  HGB 14.4  HCT 44.0  PLT 152    Chemistries  Recent Labs  Lab 06/06/17 1017 06/07/17 0430  NA 135 133*  K 3.5 4.1  CL 97* 95*  CO2 27 30  GLUCOSE 96 108*  BUN 13 14  CREATININE 1.08 1.11  CALCIUM 9.4 8.8*  AST 18  --   ALT <5*  --   ALKPHOS 58  --   BILITOT 2.2*  --     Microbiology Results   Recent Results (from the past 240 hour(s))  Culture, blood (routine x 2)     Status: None (Preliminary result)   Collection Time: 06/06/17 10:17 AM  Result Value Ref Range Status   Specimen Description BLOOD RIGHT ANTECUBITAL  Final     Special Requests   Final    BOTTLES DRAWN AEROBIC AND ANAEROBIC Blood Culture adequate volume   Culture NO GROWTH 2 DAYS  Final   Report Status PENDING  Incomplete  Culture, blood (routine x 2)     Status: None (Preliminary result)   Collection Time: 06/06/17 11:14 AM  Result Value Ref Range Status   Specimen Description BLOOD RIGHT ANTECUBITAL  Final   Special Requests   Final    BOTTLES DRAWN AEROBIC AND ANAEROBIC Blood Culture adequate volume   Culture NO GROWTH 2 DAYS  Final   Report Status PENDING  Incomplete    RADIOLOGY:  Us Venous Img Lower Unilateral Right  Result Date: 06/06/2017 CLINICAL DATA:  71-year-old male with a history of right leg redness and swelling EXAM: RIGHT LOWER EXTREMITY VENOUS DOPPLER ULTRASOUND TECHNIQUE: Gray-scale sonography with graded compression, as well as color Doppler and  duplex ultrasound were performed to evaluate the lower extremity deep venous systems from the level of the common femoral vein and including the common femoral, femoral, profunda femoral, popliteal and calf veins including the posterior tibial, peroneal and gastrocnemius veins when visible. The superficial great saphenous vein was also interrogated. Spectral Doppler was utilized to evaluate flow at rest and with distal augmentation maneuvers in the common femoral, femoral and popliteal veins. COMPARISON:  None. FINDINGS: Contralateral Common Femoral Vein: Respiratory phasicity is normal and symmetric with the symptomatic side. No evidence of thrombus. Normal compressibility. Common Femoral Vein: No evidence of thrombus. Normal compressibility, respiratory phasicity and response to augmentation. Saphenofemoral Junction: No evidence of thrombus. Normal compressibility and flow on color Doppler imaging. Profunda Femoral Vein: No evidence of thrombus. Normal compressibility and flow on color Doppler imaging. Femoral Vein: No evidence of thrombus. Normal compressibility, respiratory phasicity and response to augmentation. Popliteal Vein: No evidence of thrombus. Normal compressibility, respiratory phasicity and response to augmentation. Calf Veins: No evidence of thrombus. Normal compressibility and flow on color Doppler imaging. Superficial Great Saphenous Vein: No evidence of thrombus. Normal compressibility and flow on color Doppler imaging. Other Findings:  None. IMPRESSION: Sonographic survey of the right lower extremity negative for DVT Electronically Signed   By: Jaime  Wagner D.O.   On: 06/06/2017 12:03     Management plans discussed with the patient, family and they are in agreement.  CODE STATUS:     Code Status Orders  (From admission, onward)        Start     Ordered   06/06/17 1403  Full code  Continuous     06/06/17 1402    Code Status History    Date Active Date Inactive Code Status  Order ID Comments User Context   12/12/2016 00:55 12/14/2016 19:38 Full Code [id number]  Willis, David, MD Inpatient   11/15/2016 12:43 11/17/2016 17:02 Full Code [id number]  Merrell, David J, MD ED   09/02/2016 15:57 09/03/2016 17:33 Full Code [id number]  Jones, David S, MD Inpatient   08/23/2016 11:13 09/02/2016 15:57 Full Code [id number]  Jones, David S, MD Inpatient   03/18/2016 00:37 03/19/2016 15:51 Full Code [id number]  Carter, Nikki, MD Inpatient   01/27/2016 16:33 01/28/2016 19:03 Full Code [id number]  Taylor, Gregg W, MD Inpatient   01/25/2016 20:47 01/27/2016 16:33 Full Code [id number]  Lancaster, Abigail Joseph, MD Inpatient   01/21/2016 19:07 01/23/2016 15:20 Full Code [id number]  McKeag, Ian D, MD Inpatient   08/14/2015 02:27 08/15/2015 15:49 Full Code [id number]  Pyreddy, Pavan, MD ED   08/04/2014 18:26 08/07/2014 21:55 Full Code [id number]    Tannenbaum, Sigmund I, MD Inpatient   08/03/2014 01:03 08/03/2014 16:32 Full Code [id number]  Doutova, Anastassia, MD Inpatient   07/17/2013 17:20 07/19/2013 13:25 Full Code [id number]  Jones, David S, MD Inpatient    Advance Directive Documentation     Most Recent Value  Type of Advance Directive  Out of facility DNR (pink MOST or yellow form), Healthcare Power of Attorney  Pre-existing out of facility DNR order (yellow form or pink MOST form)  No data  "MOST" Form in Place?  No data      TOTAL TIME TAKING CARE OF THIS PATIENT: 40 minutes.      M.D on 06/08/2017 at 11:50 AM  Between 7am to 6pm - Pager - [phone] After 6pm go to [email or web address] - password [email or web address]  [facility]  Office  [phone]  CC: Primary care physician; Burnette, Jennifer M, PA-C    

## 2017-06-08 NOTE — Care Management (Signed)
Patient to discharge home today.  PT has assessed patient and recommended home health PT.  Wife [name]) at bedside.  RNCM introduced myself.  Notified wife that I left a copy of the PCS services in the room, as she requested per our conversation yesterday.  Wife inquires about possibly of discharging to SNF. RNCM informed wife that patient would not qualify for SNF, due to recommendation of Home health, as well patient does not have a qualifying 3 night stay for Medicare requirements.  At that time wife started yelling expletives at this [facility], and I was asked not to speak to her anymore, and direct my questions to her son.  RNCM explained home health services and equipment to son and patient.  At that time wife yelled "you are not allowed to set up anything, we don't need anything from you". RNCM confirmed that patient and family declined all services and equipment.  RNCM informed patient that should he changed his mind to notified me prior to discharge.  At that time wife continued to yell expletives, and RNCM notified them that I would remove myself from the room. Wife request to speak to administration.  Department assistant director and attending notified.

## 2017-06-08 NOTE — Progress Notes (Addendum)
Daily Progress Note   Patient Name: Dennis Radney Lafauci Sr.       Date: 06/08/2017 DOB: Mar 28, 1946  Age: 71 y.o. MRN#: [id number] Attending Physician: Fritzi Mandes, MD Primary Care Physician: Rubye Beach Admit Date: 06/06/2017  Reason for Consultation/Follow-up: Establishing goals of care  Subjective: Mr. Dennis [name] is resting in bed with wife and son at bedside for family meeting involving son. He states he does not want to be a burden to his family. They state 2 years ago, other than back problems, he lived a normal life. They state 3 months ago he was driving, and able to do ADL's. Now he is unable to dress himself besides putting elastic shorts on with great difficulty. He is unable to fasten seat belt in car, and has to have assistance with bathing and ADL's including wiping after a BM. His wife states she travels frequently, and did not realize how much care he was requiring at home from his son. He is following with Neurology for parkinson's management as he was diagnosed around a month ago. He states his appetite has progressively worsened, but her drinks a lot of water. Yesterday, he stated he put his face in the plate to eat because he could not hold utensils. Tremors an weakness, and intermittent vision problems are the symptoms he complains of. He denies issues with his CHF.   Upon discussing [place], he does not want chest compressions, intubation, or shocks. He would be willing to be placed on a ventilator if needed but would not want to have a tracheostomy placed. He states he does not want to be a vegetable. He also states he would not want a feeding tube placed.   Upon discussion of agressive management vs hospice management, he states "I'm not giving up, I'm not ready to stop  fighting yet, I'm not ready to die." He stated he wants to continue treatment.  His wife stated she was going to continue fighting for him. He is amenable to palliative at home.   His wife inquired about SNF placement as his son has had to provide 24 hour assistance for him. She would like for him to get stronger and learn ways to do ADL's to make him more independent. Explained that it may be possible, but it would have to be discussed with  PT and case management regarding eligibility. She stated I know it's up to PT, I went through this with my grandmother.     MOST form completed and signed by patient.  DNR, full scope with note of no tracheostomy. No feeding tube.   Length of Stay: 2  Current Medications: Scheduled Meds:  . allopurinol  100 mg Oral BID  . aspirin EC  81 mg Oral Daily  . atorvastatin  20 mg Oral q1800  . carbidopa-levodopa  2 tablet Oral QID  . carvedilol  3.125 mg Oral BID WC  . cephALEXin  500 mg Oral Q8H  . clopidogrel  75 mg Oral Daily  . enoxaparin (LOVENOX) injection  40 mg Subcutaneous Q12H  . furosemide  40 mg Oral Daily  . hydrocortisone  10 mg Oral Q supper  . hydrocortisone  20 mg Oral Q breakfast  . indomethacin  50 mg Oral TID WC  . levothyroxine  224 mcg Oral QAC breakfast  . losartan  100 mg Oral Daily  . mometasone-formoterol  2 puff Inhalation BID  . sertraline  100 mg Oral Daily    Continuous Infusions:   PRN Meds: acetaminophen **OR** acetaminophen, albuterol, ondansetron **OR** ondansetron (ZOFRAN) IV, oxyCODONE-acetaminophen, polyethylene glycol, sodium chloride  Physical Exam  Constitutional: He is oriented to person, place, and time. No distress.  Musculoskeletal:  Pink color to lower extremity.   Neurological: He is alert and oriented to person, place, and time.  Skin: Skin is warm and dry.            Vital Signs: BP 135/76   Pulse 66   Temp (!) 97.4 F (36.3 C) (Oral)   Resp 18   Ht 6\' 7"  (2.007 m)   Wt (!) 165.6 kg (365 lb)    SpO2 97%   BMI 41.12 kg/m  SpO2: SpO2: 97 % O2 Device: O2 Device: Not Delivered O2 Flow Rate:    Intake/output summary:   Intake/Output Summary (Last 24 hours) at 06/08/2017 1040 Last data filed at 06/08/2017 7858 Gross per 24 hour  Intake 823 ml  Output 1250 ml  Net -427 ml   LBM: Last BM Date: 06/05/17 Baseline Weight: Weight: (!) 165.6 kg (365 lb) Most recent weight: Weight: (!) 165.6 kg (365 lb)       Palliative Assessment/Data: 40%    Flowsheet Rows     Most Recent Value  Intake Tab  Referral Department  Hospitalist  Unit at Time of Referral  Med/Surg Unit  Palliative Care Primary Diagnosis  Cancer  Date Notified  06/07/17  Palliative Care Type  Return patient Palliative Care  Reason for referral  Clarify Goals of Care  Date of Admission  06/06/17  Date first seen by Palliative Care  06/06/17  # of days Palliative referral response time  -1 Day(s)  # of days IP prior to Palliative referral  1  Clinical Assessment  Psychosocial & Spiritual Assessment  Palliative Care Outcomes      Patient Active Problem List   Diagnosis Date Noted  . Cellulitis 06/06/2017  . History of prostate cancer 02/10/2017  . S/P prostatectomy 02/10/2017  . Spontaneous bruising 02/08/2017  . History of sepsis 12/11/2016  . History of pneumonia 12/11/2016  . Acute on chronic kidney failure ([place]) 12/11/2016  . History of stroke   . Ischemic stroke ([place]) 11/15/2016  . Localized swelling of lower extremity 11/15/2016  . History of cellulitis 11/15/2016  . Addison's disease ([place]) 11/15/2016  . Hypogonadotropic hypogonadism ([place]) 06/09/2016  .  Adrenal insufficiency ([place]) 03/18/2016  . Vertigo 03/17/2016  . AKI (acute kidney injury) ([place])   . Stroke (cerebrum) ([place])   . Essential hypertension   . Hypotension due to drugs   . Palpitations   . Acquired hypothyroidism 11/04/2015  . Arthritis 11/04/2015  . Decreased libido 11/04/2015  . Narrowing of intervertebral disc space  11/04/2015  . Major depressive disorder, single episode, mild ([place]) 11/04/2015  . H/o Lyme disease 11/04/2015  . Hypercholesterolemia 11/04/2015  . Morbid obesity ([place]) 11/04/2015  . Temporary cerebral vascular dysfunction 11/04/2015  . Chronic tophaceous gout 09/21/2015  . Blood glucose elevated 06/02/2015  . OSA on CPAP 08/03/2014  . Chronic diastolic CHF (congestive heart failure) ([place]) 08/03/2014  . CAD (coronary artery disease) 08/02/2014  . Bursitis, trochanteric 05/15/2014  . S/P lumbar spinal fusion 07/17/2013  . Chronic back pain     Palliative Care Assessment & Plan   Patient Profile:  DonaldCoxis a61 y.o.malewith a known history of previous CVA x11, MI x2, Addison's disease, history of testicular cancer, history of prostate cancer, gout, hypertension, hyperlipidemia, hypothyroidism, history of obstructive sleep apnea, and Parkinson's disease. Admitted for lower extremity cellulitis.      Assessment: Mr. Apuzzo is frustrated and does not want to be a burden on his family.   Recommendations/Plan:  Outpatient palliative care at discharge.   Goals of Care and Additional Recommendations:  Limitations on Scope of Treatment: No PEG, no trach, DNR.   Code Status:    Code Status Orders  (From admission, onward)        Start     Ordered   06/06/17 1403  Full code  Continuous     06/06/17 1402    Code Status History    Date Active Date Inactive Code Status Order ID Comments User Context   12/12/2016 00:55 12/14/2016 19:38 Full Code [id number]  Lance Coon, MD Inpatient   11/15/2016 12:43 11/17/2016 17:02 Full Code [id number]  Waldemar Dickens, MD ED   09/02/2016 15:57 09/03/2016 17:33 Full Code [id number]  Eustace Moore, MD Inpatient   08/23/2016 11:13 09/02/2016 15:57 Full Code [id number]  Eustace Moore, MD Inpatient   03/18/2016 00:37 03/19/2016 15:51 Full Code [id number]  Lily Kocher, MD Inpatient   01/27/2016 16:33 01/28/2016 19:03 Full Code [id number]  Evans Lance, MD  Inpatient   01/25/2016 20:47 01/27/2016 16:33 Full Code [id number]  Verner Mould, MD Inpatient   01/21/2016 19:07 01/23/2016 15:20 Full Code [id number]  Elberta Leatherwood, MD Inpatient   08/14/2015 02:27 08/15/2015 15:49 Full Code [id number]  Saundra Shelling, MD ED   08/04/2014 18:26 08/07/2014 21:55 Full Code [id number]  [name], MD Inpatient   08/03/2014 01:03 08/03/2014 16:32 Full Code [id number]  Toy Baker, MD Inpatient   07/17/2013 17:20 07/19/2013 13:25 Full Code [id number]  Eustace Moore, MD Inpatient    Advance Directive Documentation     Most Recent Value  Type of Advance Directive  Out of facility DNR (pink MOST or yellow form), Healthcare Power of Attorney  Pre-existing out of facility DNR order (yellow form or pink MOST form)  No data  "MOST" Form in Place?  No data       Prognosis:   Depends on treatment of Parkinson's.   Discharge Planning:  Per conversations with CM and MD, discharge home.   Care plan was discussed with Dr. Posey Pronto, Manuella Ghazi, and CM.   Thank you for allowing the Palliative Medicine Team to assist in the care of this patient.  Time In: 9:15 Time Out: 10:35 Total Time 80 Prolonged Time Billed  yes      Greater than 50%  of this time was spent counseling and coordinating care related to the above assessment and plan.  Asencion Gowda, NP 06/08/2017 12:19 PM Office: [phone] 7am-7pm  Please see Amion for pager number and availability  Call primary team after hours  Please contact Palliative Medicine Team phone at [phone] for questions and concerns.

## 2017-06-08 NOTE — Discharge Instructions (Signed)
Keep right LEg elevated when sitting and lying down

## 2017-06-09 ENCOUNTER — Encounter: Payer: Self-pay | Admitting: *Deleted

## 2017-06-09 ENCOUNTER — Other Ambulatory Visit: Payer: Self-pay | Admitting: *Deleted

## 2017-06-09 NOTE — Patient Outreach (Signed)
[facility]) Care Management  06/09/2017  Dennis Cobarrubias Heilman Sr. [birth year] [id number]  Transition of care  Referral source: Hospital Liaison 71 year old male with [place] admission 11/27- 11/29, Dx: Cellulitis right lower extremity  PMHx: includes bu not limited to , Parkinson's , obstructive sleep apena, Addison Disease, CVA, history of agent orange exposure.    Successful outreach call to patient , explained purpose of call, transition of care program, patient agreeable to services. Patient explained he has his phone on speaker and his wife [name] is in the room.  Patient reports he is doing okay on today, rested well on last night. Patient reports that his right leg is a little less swollen and some decrease in redness, discussed redness area was marked in the hospital and it has not increased beyond that area. Patient has filled prescription for antibiotic Keflex, wife discussed 3 days of IV antibiotics usually works best for patient with his other conditions, she is hopeful the pills will help.  Patient was recently discharged from hospital and all medications have been reviewed.  Patient/Wife  further discusses:  Home Care needs  Patient reports he has previous orders from neurology for outpatient occupational and physical therapy to begin on 12/11. He states he would have preferred to be able to go for short rehab stay from hospital discharge, but was not approved.  Patient and wife at this time declines any new orders for home health , reports they are working with the [place] to get approval for home health services, and equipment needs,  u shaped walker, bedside commode, scooter chair, lift chair, they discussed any new orders may interfere with that. Patient states the orders are in procurement and they hope to have full approval soon. Patient wife on the phone and discussed the many contacts they she as made regarding getting patient assistance for patient. She discussed  going through [employer] to be able to get able transferred from [place] in [place] to [place]. She has been working with veteran advocate to help with patient getting VA disability benefits.  Wife is working on [employer] to be able to be at home attend patient appointments. Patient and wife usually assist patient with ADL's, patient states he currently uses a cane in home.   Medications  Patient and wife discussed they are always concerned regarding how po antibiotics and other other medication will react with his addison and parkinson medications, recently stopped one medication due to this, unable to recall name of medication after recommended by pharmacist at [facility]. Patient is on greater than 15 medications. Patient denies cost concerns for purchasing medications. Patient report his has been taking Lasix 40 mg  twice a day before hospital admission and for many  years and will continue to take this way , and if swelling or gets bloaty he will take 80 mg twice a day but that is rare.He  Will follow up with PCP at visit next week.   Parkinson's Wife discussed patient rapid decline with parkinson's symptoms, so she is fighting hard for the best for her husband, Expressed being pleased to have a local neurologist , Dr. Manuella Ghazi for follow up. Patient has office visit with Dr.Shah on today.  Wife and son provide transportation to medical appointment, denies transportation needs, discussed [facility] LCSW role patient and wife denies needs at this time, they are working through the [place] to access  his benefits/sevices for home.  Wife acknowledged that they will receive palliative care follow up at  home, has not received a call yet.  Patient has PCP follow up visit scheduled.    Outpatient Encounter Medications as of 06/09/2017  Medication Sig  . albuterol (PROVENTIL HFA;VENTOLIN HFA) 108 (90 Base) MCG/ACT inhaler Inhale 2 puffs into the lungs every 6 (six) hours as needed for wheezing or shortness of breath.  .  allopurinol (ZYLOPRIM) 100 MG tablet Take 1 tablet (100 mg total) by mouth 2 (two) times daily.  Marland Kitchen aspirin EC 81 MG tablet Take 81 mg by mouth daily.  Marland Kitchen atorvastatin (LIPITOR) 20 MG tablet TAKE 1 TABLET(20 MG) BY MOUTH DAILY  . carbidopa-levodopa (SINEMET IR) 25-250 MG tablet Take 2 tablets by mouth 4 (four) times daily.   . carvedilol (COREG) 3.125 MG tablet Take 3.125 mg by mouth 2 (two) times daily with a meal.  . cephALEXin (KEFLEX) 500 MG capsule Take 1 capsule (500 mg total) by mouth every 8 (eight) hours.  . clopidogrel (PLAVIX) 75 MG tablet Take 75 mg by mouth daily.  Marland Kitchen EPINEPHrine 0.3 mg/0.3 mL IJ SOAJ injection Inject 0.3 mLs (0.3 mg total) into the muscle as needed (allergic reaction).  . furosemide (LASIX) 20 MG tablet Take 2 tablets (40 mg total) by mouth daily.  . hydrocortisone (CORTEF) 10 MG tablet 2 tablets in the morning and 1 tablet at 5 PM  . ciprofloxacin (CIPRO) 500 MG tablet Take 500 mg by mouth 2 (two) times daily as needed.  Marland Kitchen guaiFENesin-dextromethorphan (ROBITUSSIN DM) 100-10 MG/5ML syrup Take 5 mLs by mouth every 4 (four) hours as needed for cough. (Patient not taking: Reported on 05/27/2017)  . indomethacin (INDOCIN) 50 MG capsule Take 50 mg by mouth 3 (three) times daily with meals.  Marland Kitchen levothyroxine (SYNTHROID, LEVOTHROID) 112 MCG tablet TAKE 2 TABLETS(224 MCG) BY MOUTH DAILY BEFORE BREAKFAST (Patient taking differently: TAKE 2 TABLETS BY MOUTH EVERY MORNING AND 1 TABLET BY MOUTH EVERY EVENING)  . loratadine (CLARITIN) 10 MG tablet Take 1 tablet (10 mg total) by mouth daily. (Patient not taking: Reported on 06/06/2017)  . losartan (COZAAR) 100 MG tablet TAKE 1 TABLET(100 MG) BY MOUTH DAILY  . mometasone-formoterol (DULERA) 200-5 MCG/ACT AERO Inhale 2 puffs into the lungs 2 (two) times daily.  Marland Kitchen oxyCODONE-acetaminophen (PERCOCET) 5-325 MG tablet Take 2 tablets by mouth every 4 (four) hours as needed.  . polyethylene glycol (MIRALAX / GLYCOLAX) packet Take 17 g by mouth  daily as needed for mild constipation.   . sertraline (ZOLOFT) 100 MG tablet Take 100 mg by mouth daily.  . sildenafil (REVATIO) 20 MG tablet Take 20 mg by mouth.  . testosterone cypionate (DEPO-TESTOSTERONE) 200 MG/ML injection Inject 0.75 mLs (150 mg total) into the muscle every 14 (fourteen) days.   Facility-Administered Encounter Medications as of 06/09/2017  Medication  . testosterone cypionate (DEPOTESTOSTERONE CYPIONATE) injection 200 mg    Plan Patient will receive weekly transition of care calls, next call in a week  Will send MD barrier letter Will place pharmacy consult for medication review. Placed call to PCP office spoke with Nurse Sherron Flemings regarding clarification of lasix dose, will review at PCP visit on 12/3.    [facility] CM Care Plan Problem One     Most Recent Value  Care Plan Problem One  At risk for readmission,related to recent admission with cellulits   Role Documenting the Problem One  Care Management [place] for Problem One  Active  [facility] Long Term Goal   Patient will not experience a hospital admission in the next  31 days   [place] Term Goal Start Date  06/09/17  Interventions for Problem One Long Term Goal  Reviewed discharge instructions, advised regarding taking medications as prescribed and notifying MD sooner for new concerns to arrange office visit to avoid readmission   [facility] CM Short Term Goal #1   Patient will attend all medical appointment in the next 30 days   [facility] CM Short Term Goal #1 Start Date  06/09/17  Interventions for Short Term Goal #1  Reviewed current visits with neurologist , PCP .   [facility] CM Short Term Goal #2   Patient will be able to report improvement in cellulits symptoms in the next 30 days   [facility] CM Short Term Goal #2 Start Date  06/09/17  Interventions for Short Term Goal #2  Discussed current clinical status of skin, reviewed symptoms of worsening of cellulits, increased redness,swelling,fever, pain,  increase in size beyond  measured area,       Joylene Draft, RN, [place] Management Coordinator  [phone]- Mobile [phone]- [place] Office

## 2017-06-11 LAB — CULTURE, BLOOD (ROUTINE X 2)
Culture: NO GROWTH
Culture: NO GROWTH
Special Requests: ADEQUATE
Special Requests: ADEQUATE

## 2017-06-12 ENCOUNTER — Encounter: Payer: Self-pay | Admitting: Family Medicine

## 2017-06-12 ENCOUNTER — Other Ambulatory Visit: Payer: Self-pay

## 2017-06-12 ENCOUNTER — Ambulatory Visit: Payer: Medicare Other | Admitting: Physician Assistant

## 2017-06-12 ENCOUNTER — Encounter (HOSPITAL_COMMUNITY): Payer: Self-pay | Admitting: Emergency Medicine

## 2017-06-12 ENCOUNTER — Inpatient Hospital Stay (HOSPITAL_COMMUNITY)
Admission: EM | Admit: 2017-06-12 | Discharge: 2017-06-15 | DRG: 603 | Disposition: A | Discharge status: Skilled Nursing Facility | Payer: Medicare Other | Attending: Family Medicine | Admitting: Family Medicine

## 2017-06-12 ENCOUNTER — Ambulatory Visit: Payer: Medicare Other | Admitting: Family Medicine

## 2017-06-12 ENCOUNTER — Telehealth: Payer: Self-pay | Admitting: Endocrinology

## 2017-06-12 ENCOUNTER — Emergency Department (HOSPITAL_COMMUNITY)
Admit: 2017-06-12 | Discharge: 2017-06-12 | Disposition: A | Discharge status: Home / Self Care | Payer: Medicare Other | Attending: Emergency Medicine | Admitting: Emergency Medicine

## 2017-06-12 ENCOUNTER — Ambulatory Visit (INDEPENDENT_AMBULATORY_CARE_PROVIDER_SITE_OTHER): Payer: Medicare Other | Admitting: Family Medicine

## 2017-06-12 VITALS — BP 118/62 | HR 103 | Temp 97.6°F | Wt 376.2 lb

## 2017-06-12 DIAGNOSIS — I5032 Chronic diastolic (congestive) heart failure: Secondary | ICD-10-CM | POA: Diagnosis not present

## 2017-06-12 DIAGNOSIS — Z7982 Long term (current) use of aspirin: Secondary | ICD-10-CM | POA: Diagnosis not present

## 2017-06-12 DIAGNOSIS — E7800 Pure hypercholesterolemia, unspecified: Secondary | ICD-10-CM | POA: Diagnosis present

## 2017-06-12 DIAGNOSIS — M79609 Pain in unspecified limb: Secondary | ICD-10-CM

## 2017-06-12 DIAGNOSIS — Z981 Arthrodesis status: Secondary | ICD-10-CM

## 2017-06-12 DIAGNOSIS — R2681 Unsteadiness on feet: Secondary | ICD-10-CM | POA: Diagnosis not present

## 2017-06-12 DIAGNOSIS — Z8547 Personal history of malignant neoplasm of testis: Secondary | ICD-10-CM | POA: Diagnosis not present

## 2017-06-12 DIAGNOSIS — R278 Other lack of coordination: Secondary | ICD-10-CM | POA: Diagnosis not present

## 2017-06-12 DIAGNOSIS — I252 Old myocardial infarction: Secondary | ICD-10-CM | POA: Diagnosis not present

## 2017-06-12 DIAGNOSIS — I2510 Atherosclerotic heart disease of native coronary artery without angina pectoris: Secondary | ICD-10-CM | POA: Diagnosis present

## 2017-06-12 DIAGNOSIS — F329 Major depressive disorder, single episode, unspecified: Secondary | ICD-10-CM | POA: Diagnosis present

## 2017-06-12 DIAGNOSIS — M7989 Other specified soft tissue disorders: Secondary | ICD-10-CM

## 2017-06-12 DIAGNOSIS — G20 Parkinson's disease: Secondary | ICD-10-CM | POA: Diagnosis not present

## 2017-06-12 DIAGNOSIS — Z8546 Personal history of malignant neoplasm of prostate: Secondary | ICD-10-CM

## 2017-06-12 DIAGNOSIS — L03115 Cellulitis of right lower limb: Secondary | ICD-10-CM

## 2017-06-12 DIAGNOSIS — G4733 Obstructive sleep apnea (adult) (pediatric): Secondary | ICD-10-CM | POA: Diagnosis present

## 2017-06-12 DIAGNOSIS — M6281 Muscle weakness (generalized): Secondary | ICD-10-CM | POA: Diagnosis not present

## 2017-06-12 DIAGNOSIS — L03119 Cellulitis of unspecified part of limb: Secondary | ICD-10-CM | POA: Diagnosis not present

## 2017-06-12 DIAGNOSIS — I130 Hypertensive heart and chronic kidney disease with heart failure and stage 1 through stage 4 chronic kidney disease, or unspecified chronic kidney disease: Secondary | ICD-10-CM | POA: Diagnosis present

## 2017-06-12 DIAGNOSIS — N182 Chronic kidney disease, stage 2 (mild): Secondary | ICD-10-CM | POA: Diagnosis present

## 2017-06-12 DIAGNOSIS — S8990XA Unspecified injury of unspecified lower leg, initial encounter: Secondary | ICD-10-CM | POA: Diagnosis not present

## 2017-06-12 DIAGNOSIS — M109 Gout, unspecified: Secondary | ICD-10-CM | POA: Diagnosis present

## 2017-06-12 DIAGNOSIS — E876 Hypokalemia: Secondary | ICD-10-CM | POA: Diagnosis not present

## 2017-06-12 DIAGNOSIS — E790 Hyperuricemia without signs of inflammatory arthritis and tophaceous disease: Secondary | ICD-10-CM | POA: Diagnosis not present

## 2017-06-12 DIAGNOSIS — Z79899 Other long term (current) drug therapy: Secondary | ICD-10-CM

## 2017-06-12 DIAGNOSIS — Z6841 Body Mass Index (BMI) 40.0 and over, adult: Secondary | ICD-10-CM

## 2017-06-12 DIAGNOSIS — Z8673 Personal history of transient ischemic attack (TIA), and cerebral infarction without residual deficits: Secondary | ICD-10-CM | POA: Diagnosis not present

## 2017-06-12 DIAGNOSIS — E271 Primary adrenocortical insufficiency: Secondary | ICD-10-CM | POA: Diagnosis present

## 2017-06-12 DIAGNOSIS — Z96652 Presence of left artificial knee joint: Secondary | ICD-10-CM | POA: Diagnosis present

## 2017-06-12 DIAGNOSIS — M79661 Pain in right lower leg: Secondary | ICD-10-CM | POA: Diagnosis not present

## 2017-06-12 DIAGNOSIS — E039 Hypothyroidism, unspecified: Secondary | ICD-10-CM | POA: Diagnosis not present

## 2017-06-12 DIAGNOSIS — M1A9XX1 Chronic gout, unspecified, with tophus (tophi): Secondary | ICD-10-CM

## 2017-06-12 DIAGNOSIS — Z7902 Long term (current) use of antithrombotics/antiplatelets: Secondary | ICD-10-CM | POA: Diagnosis not present

## 2017-06-12 DIAGNOSIS — R5383 Other fatigue: Secondary | ICD-10-CM | POA: Diagnosis not present

## 2017-06-12 DIAGNOSIS — M79604 Pain in right leg: Secondary | ICD-10-CM

## 2017-06-12 DIAGNOSIS — R5381 Other malaise: Secondary | ICD-10-CM | POA: Diagnosis not present

## 2017-06-12 DIAGNOSIS — R41841 Cognitive communication deficit: Secondary | ICD-10-CM | POA: Diagnosis not present

## 2017-06-12 DIAGNOSIS — L0390 Cellulitis, unspecified: Secondary | ICD-10-CM | POA: Diagnosis present

## 2017-06-12 DIAGNOSIS — M6259 Muscle wasting and atrophy, not elsewhere classified, multiple sites: Secondary | ICD-10-CM | POA: Diagnosis not present

## 2017-06-12 LAB — CBC WITH DIFFERENTIAL/PLATELET
Basophils Absolute: 0 10*3/uL (ref 0.0–0.1)
Basophils Relative: 0 %
Eosinophils Absolute: 0.1 10*3/uL (ref 0.0–0.7)
Eosinophils Relative: 2 %
HCT: 48.9 % (ref 39.0–52.0)
Hemoglobin: 15.4 g/dL (ref 13.0–17.0)
Lymphocytes Relative: 20 %
Lymphs Abs: 1.3 10*3/uL (ref 0.7–4.0)
MCH: 26.6 pg (ref 26.0–34.0)
MCHC: 31.5 g/dL (ref 30.0–36.0)
MCV: 84.6 fL (ref 78.0–100.0)
Monocytes Absolute: 0.4 10*3/uL (ref 0.1–1.0)
Monocytes Relative: 6 %
Neutro Abs: 4.8 10*3/uL (ref 1.7–7.7)
Neutrophils Relative %: 72 %
Platelets: 207 10*3/uL (ref 150–400)
RBC: 5.78 MIL/uL (ref 4.22–5.81)
RDW: 14 % (ref 11.5–15.5)
WBC: 6.7 10*3/uL (ref 4.0–10.5)

## 2017-06-12 LAB — I-STAT CHEM 8, ED
BUN: 10 mg/dL (ref 6–20)
Calcium, Ion: 1.18 mmol/L (ref 1.15–1.40)
Chloride: 98 mmol/L — ABNORMAL LOW (ref 101–111)
Creatinine, Ser: 1 mg/dL (ref 0.61–1.24)
Glucose, Bld: 119 mg/dL — ABNORMAL HIGH (ref 65–99)
HCT: 47 % (ref 39.0–52.0)
Hemoglobin: 16 g/dL (ref 13.0–17.0)
Potassium: 3.4 mmol/L — ABNORMAL LOW (ref 3.5–5.1)
Sodium: 139 mmol/L (ref 135–145)
TCO2: 28 mmol/L (ref 22–32)

## 2017-06-12 LAB — BASIC METABOLIC PANEL
Anion gap: 12 (ref 5–15)
BUN: 9 mg/dL (ref 6–20)
CO2: 21 mmol/L — ABNORMAL LOW (ref 22–32)
Calcium: 9.5 mg/dL (ref 8.9–10.3)
Chloride: 103 mmol/L (ref 101–111)
Creatinine, Ser: 1.19 mg/dL (ref 0.61–1.24)
GFR calc Af Amer: >60 mL/min (ref >60)
GFR calc non Af Amer: 60 mL/min — ABNORMAL LOW (ref >60)
Glucose, Bld: 86 mg/dL (ref 65–99)
Potassium: 3.8 mmol/L (ref 3.5–5.1)
Sodium: 136 mmol/L (ref 135–145)

## 2017-06-12 LAB — I-STAT CG4 LACTIC ACID, ED
Lactic Acid, Venous: 1.24 mmol/L (ref 0.5–1.9)
Lactic Acid, Venous: 0.99 mmol/L (ref 0.5–1.9)

## 2017-06-12 MED ORDER — IBUPROFEN 600 MG PO TABS: 600.0000 mg | ORAL_TABLET | Freq: Four times a day (QID) | ORAL | Status: DC | PRN

## 2017-06-12 MED ORDER — ACETAMINOPHEN 650 MG RE SUPP: 650.0000 mg | Freq: Four times a day (QID) | RECTAL | Status: DC | PRN

## 2017-06-12 MED ORDER — INDOMETHACIN 25 MG PO CAPS
50.0000 mg | ORAL_CAPSULE | Freq: Three times a day (TID) | ORAL | Status: DC
  Administered 2017-06-13: 50 mg via ORAL
  Filled 2017-06-12 (×2): qty 2

## 2017-06-12 MED ORDER — ASPIRIN EC 81 MG PO TBEC
81.0000 mg | DELAYED_RELEASE_TABLET | Freq: Every day | ORAL | Status: DC
  Administered 2017-06-13 – 2017-06-15 (×3): 81 mg via ORAL
  Filled 2017-06-12 (×3): qty 1

## 2017-06-12 MED ORDER — SERTRALINE HCL 100 MG PO TABS
100.0000 mg | ORAL_TABLET | Freq: Every day | ORAL | Status: DC
  Administered 2017-06-13 – 2017-06-15 (×3): 100 mg via ORAL
  Filled 2017-06-12 (×3): qty 1

## 2017-06-12 MED ORDER — CEFAZOLIN SODIUM-DEXTROSE 2-4 GM/100ML-% IV SOLN
2.0000 g | Freq: Three times a day (TID) | INTRAVENOUS | Status: DC
  Administered 2017-06-12 – 2017-06-14 (×5): 2 g via INTRAVENOUS
  Filled 2017-06-12 (×6): qty 100

## 2017-06-12 MED ORDER — FENTANYL CITRATE (PF) 100 MCG/2ML IJ SOLN
100.0000 ug | Freq: Once | INTRAMUSCULAR | Status: AC
  Administered 2017-06-12: 100 ug via INTRAVENOUS
  Filled 2017-06-12: qty 2

## 2017-06-12 MED ORDER — HYDROCORTISONE 20 MG PO TABS
20.0000 mg | ORAL_TABLET | Freq: Every day | ORAL | Status: DC
  Administered 2017-06-12 – 2017-06-13 (×2): 20 mg via ORAL
  Filled 2017-06-12 (×2): qty 1

## 2017-06-12 MED ORDER — HYDROCORTISONE 20 MG PO TABS
20.0000 mg | ORAL_TABLET | Freq: Every day | ORAL | Status: DC
Start: 2017-06-13 — End: 2017-06-12

## 2017-06-12 MED ORDER — CLOPIDOGREL BISULFATE 75 MG PO TABS
75.0000 mg | ORAL_TABLET | Freq: Every day | ORAL | Status: DC
  Administered 2017-06-12 – 2017-06-15 (×4): 75 mg via ORAL
  Filled 2017-06-12 (×4): qty 1

## 2017-06-12 MED ORDER — CARBIDOPA-LEVODOPA 25-250 MG PO TABS
2.0000 | ORAL_TABLET | Freq: Four times a day (QID) | ORAL | Status: DC
  Administered 2017-06-12 – 2017-06-15 (×14): 2 via ORAL
  Filled 2017-06-12 (×16): qty 2

## 2017-06-12 MED ORDER — COLCHICINE 0.6 MG PO TABS
0.6000 mg | ORAL_TABLET | Freq: Once | ORAL | Status: AC
  Administered 2017-06-13: 0.6 mg via ORAL
  Filled 2017-06-12 (×2): qty 1

## 2017-06-12 MED ORDER — ACETAMINOPHEN 325 MG PO TABS: 650.0000 mg | ORAL_TABLET | Freq: Four times a day (QID) | ORAL | Status: DC | PRN

## 2017-06-12 MED ORDER — COLCHICINE 0.6 MG PO TABS
1.2000 mg | ORAL_TABLET | Freq: Once | ORAL | Status: AC
  Administered 2017-06-12: 1.2 mg via ORAL
  Filled 2017-06-12: qty 2

## 2017-06-12 MED ORDER — ENOXAPARIN SODIUM 40 MG/0.4ML ~~LOC~~ SOLN
40.0000 mg | SUBCUTANEOUS | Status: DC
  Administered 2017-06-13: 40 mg via SUBCUTANEOUS
  Filled 2017-06-12: qty 0.4

## 2017-06-12 MED ORDER — POTASSIUM CHLORIDE CRYS ER 20 MEQ PO TBCR
40.0000 meq | EXTENDED_RELEASE_TABLET | Freq: Once | ORAL | Status: AC
  Administered 2017-06-12: 40 meq via ORAL
  Filled 2017-06-12: qty 2

## 2017-06-12 MED ORDER — ALLOPURINOL 100 MG PO TABS
100.0000 mg | ORAL_TABLET | Freq: Two times a day (BID) | ORAL | Status: DC
  Administered 2017-06-13 (×2): 100 mg via ORAL
  Filled 2017-06-12 (×3): qty 1

## 2017-06-12 MED ORDER — LEVOTHYROXINE SODIUM 112 MCG PO TABS
224.0000 ug | ORAL_TABLET | ORAL | Status: AC
  Administered 2017-06-12: 224 ug via ORAL
  Filled 2017-06-12: qty 2

## 2017-06-12 MED ORDER — LOSARTAN POTASSIUM 50 MG PO TABS
100.0000 mg | ORAL_TABLET | Freq: Every day | ORAL | Status: DC
  Administered 2017-06-13 – 2017-06-15 (×3): 100 mg via ORAL
  Filled 2017-06-12 (×3): qty 2

## 2017-06-12 MED ORDER — ATORVASTATIN CALCIUM 20 MG PO TABS
20.0000 mg | ORAL_TABLET | Freq: Every day | ORAL | Status: DC
  Administered 2017-06-13 – 2017-06-15 (×4): 20 mg via ORAL
  Filled 2017-06-12 (×6): qty 1

## 2017-06-12 MED ORDER — FUROSEMIDE 40 MG PO TABS
40.0000 mg | ORAL_TABLET | Freq: Every day | ORAL | Status: DC
  Administered 2017-06-13 – 2017-06-15 (×3): 40 mg via ORAL
  Filled 2017-06-12 (×2): qty 1
  Filled 2017-06-12: qty 2
  Filled 2017-06-12: qty 1

## 2017-06-12 MED ORDER — ONDANSETRON HCL 4 MG/2ML IJ SOLN
4.0000 mg | Freq: Once | INTRAMUSCULAR | Status: AC
  Administered 2017-06-12: 4 mg via INTRAVENOUS
  Filled 2017-06-12: qty 2

## 2017-06-12 MED ORDER — POLYETHYLENE GLYCOL 3350 17 G PO PACK: 17.0000 g | PACK | Freq: Every day | ORAL | Status: DC | PRN

## 2017-06-12 MED ORDER — CARVEDILOL 3.125 MG PO TABS
3.1250 mg | ORAL_TABLET | Freq: Two times a day (BID) | ORAL | Status: DC
  Administered 2017-06-13 – 2017-06-15 (×6): 3.125 mg via ORAL
  Filled 2017-06-12 (×6): qty 1

## 2017-06-12 NOTE — Telephone Encounter (Signed)
Called patient and he is in the [facility] ER at the moment. I gave him the message from Dr. Dwyane Dee. Then I spoke to his wife and she stated that he is going to have a nurse visit and they are going to try to get the home health care to give the testosterone injections possibly. She also stated that he needed IV antibiotics and they are going to send him home with oral antibiotics. She stated that the oral antibiotics do not work. His wife also stated that she gives herself B12 injections and has been doing for years and she wanted to know if she would be able to give him the testosterone injections?  Please advise.

## 2017-06-12 NOTE — Progress Notes (Signed)
VASCULAR LAB PRELIMINARY  PRELIMINARY  PRELIMINARY  PRELIMINARY  Right lower extremity venous duplex completed.    Preliminary report:  There is no DVT or SVT noted in the visualized veins of the right lower extremity.  Deland Slocumb, RVT 06/12/2017, 4:01 PM

## 2017-06-12 NOTE — Telephone Encounter (Signed)
Please see message.  Please advise. 

## 2017-06-12 NOTE — ED Notes (Signed)
Called 6N to give report, CN will return call
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
## 2017-06-12 NOTE — Telephone Encounter (Signed)
She can do his injections, will need to be shown how to

## 2017-06-12 NOTE — ED Triage Notes (Signed)
Pt to ER states has been in and out of the hospital with cellulitis over the last 2 weeks. Bilateral lower extremity swelling noted, toes are cyanotic on bilateral feet. Pt is tremorous, states "I'm gonna pass out." pt tachypnic. Hx of addisons. Pulses unable to be dopplered in bilateral feet.

## 2017-06-12 NOTE — ED Notes (Signed)
Pt is resting and appears comfortable with family at bedside.  Pt reports pain at 9/10 but did not want this nurse to check if he has pain med ordered.    He is aware that he is waiting for a bed

## 2017-06-12 NOTE — Telephone Encounter (Signed)
Treatment for Parkinson has nothing to do with testosterone supplements.  His testosterone level will be very low if he does not take this and recommend continuing

## 2017-06-12 NOTE — Telephone Encounter (Signed)
Pt stated he has recently found out he has stage 4 parkinson diease.  Pt Stated primary care Dr said he could no longer do testosterone shots (1 every 2 weeks) because of this.  Needs to speak someone to see if this is true.   Please advise  [phone]
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
## 2017-06-13 ENCOUNTER — Other Ambulatory Visit: Payer: Self-pay

## 2017-06-13 DIAGNOSIS — E271 Primary adrenocortical insufficiency: Secondary | ICD-10-CM

## 2017-06-13 DIAGNOSIS — M7989 Other specified soft tissue disorders: Secondary | ICD-10-CM

## 2017-06-13 DIAGNOSIS — M79604 Pain in right leg: Secondary | ICD-10-CM

## 2017-06-13 DIAGNOSIS — L03115 Cellulitis of right lower limb: Secondary | ICD-10-CM

## 2017-06-13 DIAGNOSIS — E790 Hyperuricemia without signs of inflammatory arthritis and tophaceous disease: Secondary | ICD-10-CM | POA: Diagnosis present

## 2017-06-13 DIAGNOSIS — M109 Gout, unspecified: Secondary | ICD-10-CM | POA: Diagnosis present

## 2017-06-13 LAB — CBC
HCT: 46.6 % (ref 39.0–52.0)
Hemoglobin: 14.6 g/dL (ref 13.0–17.0)
MCH: 26.2 pg (ref 26.0–34.0)
MCHC: 31.3 g/dL (ref 30.0–36.0)
MCV: 83.7 fL (ref 78.0–100.0)
Platelets: 192 10*3/uL (ref 150–400)
RBC: 5.57 MIL/uL (ref 4.22–5.81)
RDW: 13.8 % (ref 11.5–15.5)
WBC: 6.2 10*3/uL (ref 4.0–10.5)

## 2017-06-13 LAB — URIC ACID: Uric Acid, Serum: 7.8 mg/dL — ABNORMAL HIGH (ref 4.4–7.6)

## 2017-06-13 LAB — BASIC METABOLIC PANEL
Anion gap: 11 (ref 5–15)
BUN: 11 mg/dL (ref 6–20)
CO2: 26 mmol/L (ref 22–32)
Calcium: 9 mg/dL (ref 8.9–10.3)
Chloride: 95 mmol/L — ABNORMAL LOW (ref 101–111)
Creatinine, Ser: 1.21 mg/dL (ref 0.61–1.24)
GFR calc Af Amer: >60 mL/min (ref >60)
GFR calc non Af Amer: 58 mL/min — ABNORMAL LOW (ref >60)
Glucose, Bld: 125 mg/dL — ABNORMAL HIGH (ref 65–99)
Potassium: 3.9 mmol/L (ref 3.5–5.1)
Sodium: 132 mmol/L — ABNORMAL LOW (ref 135–145)

## 2017-06-13 MED ORDER — COLCHICINE 0.6 MG PO TABS
0.6000 mg | ORAL_TABLET | Freq: Two times a day (BID) | ORAL | Status: DC
  Administered 2017-06-13 – 2017-06-15 (×5): 0.6 mg via ORAL
  Filled 2017-06-13 (×5): qty 1

## 2017-06-13 MED ORDER — ENOXAPARIN SODIUM 80 MG/0.8ML ~~LOC~~ SOLN
80.0000 mg | SUBCUTANEOUS | Status: DC
  Administered 2017-06-14 – 2017-06-15 (×2): 80 mg via SUBCUTANEOUS
  Filled 2017-06-13 (×2): qty 0.8

## 2017-06-13 MED ORDER — PREDNISONE 20 MG PO TABS
40.0000 mg | ORAL_TABLET | Freq: Every day | ORAL | Status: AC
  Administered 2017-06-13 – 2017-06-15 (×3): 40 mg via ORAL
  Filled 2017-06-13 (×3): qty 2

## 2017-06-13 MED ORDER — ADULT MULTIVITAMIN W/MINERALS CH
1.0000 | ORAL_TABLET | Freq: Every day | ORAL | Status: DC
  Administered 2017-06-13 – 2017-06-15 (×3): 1 via ORAL
  Filled 2017-06-13 (×3): qty 1

## 2017-06-13 MED ORDER — ENSURE ENLIVE PO LIQD
237.0000 mL | Freq: Three times a day (TID) | ORAL | Status: DC
  Administered 2017-06-14 – 2017-06-15 (×4): 237 mL via ORAL

## 2017-06-13 NOTE — Progress Notes (Signed)
Family Medicine Teaching Service Daily Progress Note Intern Pager: [phone]  Patient name: Dennis Hinderman Sr. Medical record number: [id number] Date of birth: 21-Feb-1946 Age: 71 y.o. Gender: male  Primary Care Provider: Mar Daring, PA-C Consultants: none Code Status: full   Pt Overview and Major Events to Date:  Admitted to [facility] on 06/12/17  Assessment and Plan: Nolon Nations Voges Sr. is a 71 y.o. male presenting with RLE cellulitis. PMH is significant for known history of previous CVA, Addison's disease, history of testicular cancer, history of prostate cancer, gout, hypertension, hyperlipidemia, hypothyroidism, OSA, Parkinson's disease.  RLE Cellulitis Recently discharged from [facility] on 11/29 for RLE cellulitis, at which time patient appears to have been started on IV Vanc for 1 day, then switched to IV cefazolin for 1 day and then discharged on oral keflex TID.  XRays during that admission of RLE and foot did not show any signs of osteomyelitis. Patient reports compliance with antibiotic, but reports worsening pain, redness, and swelling. Increased heat noted in areas of redness. Afebrile and hemodynamically stable. LA was wnl. WBC 6.7. Considered DVT as cause of presentation; however preliminary dopplers negative. Could also consider gout flare as mimic of cellulitis given one significantly erythematous toe. Patient notes slight improvement in color of fourth toe but otherwise no change since admission. Patient states that Dr. Jess Barters, endocrinology, saw patient this morning and stated that he would change patient to prednisone and if this did not help he would get MRI of patient.  - continue IV cefazolin 200 mL/hr, q8hr - Tylenol q6 prn for pain; avoid additional NSAIDs given already taking scheduled indomethacin  - Blood cx pending - Elevate legs - PT/OT to evaluate and treat - consider continuing colchicine, elected not to treat with NSAIDs or steroids given already taking  chronically -trial of prednisone 40mg  daily  -discontinue hydrocortisone  Addison's Disease:  - Continue home Solu-Cortef  Parkinson's Disease: Diagnosed on October 27.  - Continue Sinemet.  - Followed by Dr. Brigitte Pulse  HTN Hypertensive on admission, improving. Currently normotensive at 120/79 - Continue home carvedilol, losartan.   H/o gout Usually occurs in first MTP and in MCP joints. Potential for acute flare, see above. Uric acid slightly elevated to 7.8.  - discontinue allopurinol during acute flare  - Continue colchicine for possible gout flare  Hypothyroidism: - Continue home synthroid  Hypokalemia:  Resolved. K of 3.9 on 06/13/17 -continue to monitor   OSA: - CPAP at night  Depression: - Continue home Zoloft  HLD: - Continue home atorvastatin; patient is only on 20 mg and would likely benefit from high intensity statin given h/o CVA   H/o of previous CVA: -Continue home aspirin 81 mg and Plavix 75 mg daily  H/o chronic diastolic CHF:  -Continue home Lasix, carvedilol, losartan -clarify Lasix dose tomorrow; patient reported 40 mg daily but EMR reports 40 mg BID   H/o prostate and testicular cancer: - On testosterone every 2 weeks, due for injection per patient, will consult pharmacy to see if available inpatient  - per patient, endocrinologist Dr. Dwyane Dee has addressed testosterone and called hospital to confirm that patient will be given dose   FEN/GI: Heart Healthy diet Prophylaxis: Lovenox  Disposition: continued inpatient admission   Subjective:  Patient today very appreciative of care. States that Dr. Jess Barters, endocrinology, had seen patient in the morning and stated that he will switch patient to prednisone instead of hydrocortisone. Patient instructed that if this does not help, patient will need MRI of  foot. Patient states tenderness throughout bottom of foot, but does not improvement in color of fourth toe. Patient states no change in  swelling. Patient states Dr. Dwyane Dee, endocrinology, has already addressed testosterone dose to hospital staff and allowed him to have injection.   Objective: Temp:  [97.7 F (36.5 C)-97.9 F (36.6 C)] 97.7 F (36.5 C) (12/04 0435) Pulse Rate:  [68-130] 79 (12/04 0435) Resp:  [15-28] 20 (12/04 0435) BP: (120-139)/(76-113) 120/79 (12/04 0435) SpO2:  [89 %-100 %] 94 % (12/04 0435) Weight:  [365 lb (165.6 kg)] 365 lb (165.6 kg) (12/03 2332) Physical Exam: General: awake and alert, laying in bed, N AD Cardiovascular: RRR, no MRG, difficult exam due to body habitus  Respiratory: CTAB, no wheezes, rales, or rhonchi Abdomen: soft, non tender, bowel sounds normal Extremities: edema of right food, erythema of right lower extremity and right foot, improvement in coloration of right fourth metatarsal (no longer black in appearance), 2+ pulses in lower extremities, no warmth to palpation  MSK: resting tremor   Laboratory: Recent Labs  Lab 06/07/17 0430 06/12/17 1510 06/12/17 1804 06/13/17 0023  WBC 5.3 6.7  --  6.2  HGB 14.4 15.4 16.0 14.6  HCT 44.0 48.9 47.0 46.6  PLT 152 207  --  192   Recent Labs  Lab 06/07/17 0430 06/12/17 1510 06/12/17 1804 06/13/17 0023  NA 133* 136 139 132*  K 4.1 3.8 3.4* 3.9  CL 95* 103 98* 95*  CO2 30 21*  --  26  BUN 14 9 10 11   CREATININE 1.11 1.19 1.00 1.21  CALCIUM 8.8* 9.5  --  9.0  GLUCOSE 108* 86 119* 125*     Ref. Range 06/12/2017 23:38  Uric Acid, Serum Latest Ref Range: 4.4 - 7.6 mg/dL 7.8 (H)    Imaging/Diagnostic Tests: Dg Tibia/fibula Right  Result Date: 06/06/2017 CLINICAL DATA:  Cellulitis right lower extremity. EXAM: RIGHT TIBIA AND FIBULA - 2 VIEW COMPARISON:  None. FINDINGS: Soft tissue swelling diffusely throughout the right calf. No underlying acute bony abnormality. No fracture, subluxation or dislocation. No radiographic changes of osteomyelitis. Degenerative changes in the right knee. IMPRESSION: No acute bony abnormality.  Electronically Signed   By: Rolm Baptise M.D.   On: 06/06/2017 11:30   US Venous Img Lower Unilateral Right  Result Date: 06/06/2017 CLINICAL DATA:  71 year old male with a history of right leg redness and swelling EXAM: RIGHT LOWER EXTREMITY VENOUS DOPPLER ULTRASOUND TECHNIQUE: Gray-scale sonography with graded compression, as well as color Doppler and duplex ultrasound were performed to evaluate the lower extremity deep venous systems from the level of the common femoral vein and including the common femoral, femoral, profunda femoral, popliteal and calf veins including the posterior tibial, peroneal and gastrocnemius veins when visible. The superficial great saphenous vein was also interrogated. Spectral Doppler was utilized to evaluate flow at rest and with distal augmentation maneuvers in the common femoral, femoral and popliteal veins. COMPARISON:  None. FINDINGS: Contralateral Common Femoral Vein: Respiratory phasicity is normal and symmetric with the symptomatic side. No evidence of thrombus. Normal compressibility. Common Femoral Vein: No evidence of thrombus. Normal compressibility, respiratory phasicity and response to augmentation. Saphenofemoral Junction: No evidence of thrombus. Normal compressibility and flow on color Doppler imaging. Profunda Femoral Vein: No evidence of thrombus. Normal compressibility and flow on color Doppler imaging. Femoral Vein: No evidence of thrombus. Normal compressibility, respiratory phasicity and response to augmentation. Popliteal Vein: No evidence of thrombus. Normal compressibility, respiratory phasicity and response to augmentation. Calf Veins: No  evidence of thrombus. Normal compressibility and flow on color Doppler imaging. Superficial Great Saphenous Vein: No evidence of thrombus. Normal compressibility and flow on color Doppler imaging. Other Findings:  None. IMPRESSION: Sonographic survey of the right lower extremity negative for DVT Electronically Signed    By: Corrie Mckusick D.O.   On: 06/06/2017 12:03   Dg Chest Port 1 View  Result Date: 05/27/2017 CLINICAL DATA:  Shortness of Breath EXAM: PORTABLE CHEST 1 VIEW COMPARISON:  01/13/2017 FINDINGS: Cardiac shadow is enlarged. Loop recorder is again noted and stable. The lungs are well aerated bilaterally. No focal infiltrate or sizable effusion is seen. No bony abnormality is noted. IMPRESSION: No active disease. Electronically Signed   By: Inez Catalina M.D.   On: 05/27/2017 16:52   Dg Foot Complete Right  Result Date: 06/06/2017 CLINICAL DATA:  Cellulitis right lower extremity for 3 weeks EXAM: RIGHT FOOT COMPLETE - 3+ VIEW COMPARISON:  None. FINDINGS: Marked soft tissue swelling along the dorsum of the foot. Mild degenerative changes at the right first MTP joint. Plantar calcaneal spur. No acute bony abnormality. No radiographic changes of osteomyelitis. IMPRESSION: Marked soft tissue swelling along the dorsum of the foot. No acute bony abnormality. Electronically Signed   By: Rolm Baptise M.D.   On: 06/06/2017 11:31    Caroline More, DO 06/13/2017, 12:08 PM PGY-1, [place] Intern pager: [phone], text pages welcome

## 2017-06-13 NOTE — Telephone Encounter (Signed)
Called patient and let him know that his wife can do his testosterone injections for him and will need to be shown how. He stated that they are supposed to give him one today since he is in the hospital and hopes she will learn today but I let him know they can come to our office and we can show her also.

## 2017-06-13 NOTE — Evaluation (Signed)
Physical Therapy Evaluation Patient Details Name: Dennis Filsinger Oubre Sr. MRN: [id number] DOB: 1946-05-27 Today's Date: 06/13/2017   History of Present Illness  Dennis Colin Moulton Sr. is a 71 y.o. male presenting with RLE cellulitis. PMH is significant for known history of previous CVA, Addison's disease, history of testicular cancer, history of prostate cancer, gout, hypertension, hyperlipidemia, hypothyroidism, OSA, Parkinson's disease.  Clinical Impression   Pt admitted with above diagnosis. Pt currently with functional limitations due to the deficits listed below (see PT Problem List). Dennis [name] has experienced a recent decline in function over the past 1-2 months; Limited by R foot pain today; noted bil UE tremors; Wife tells me he has a recent diagnosis of Parkinson's disease, and they are working on dialing in Parkinson's meds; Will benefit from SNF for consistent rehab;  Pt will benefit from skilled PT to increase their independence and safety with mobility to allow discharge to the venue listed below.       Follow Up Recommendations SNF(for short term rehab)    Equipment Recommendations  Other (comment)([facility]; pt would benefit from bariatric RW if that does not interfere with obtaining a U-walker from the [place]; Patient 6'7" and 360 lbs)    Recommendations for Other Services OT consult(as ordered)     Precautions / Restrictions Precautions Precautions: Fall Restrictions Weight Bearing Restrictions: No      Mobility  Bed Mobility Overal bed mobility: Needs Assistance Bed Mobility: Supine to Sit     Supine to sit: Supervision     General bed mobility comments: Cues for technqiue; inefficient movement  Transfers Overall transfer level: Needs assistance Equipment used: None Transfers: Sit to/from Stand Sit to Stand: Min guard         General transfer comment: Noted good power up; Very painful; minguard for  safety  Ambulation/Gait Ambulation/Gait assistance: Min assist Ambulation Distance (Feet): 3 Feet Assistive device: 1 person hand held assist Gait Pattern/deviations: Decreased stance time - right;Decreased step length - left;Antalgic     General Gait Details: Very painful RLE; pt wanted to try amb without assistive device despite cues to use RW to [employer] painful R foot  Stairs            Wheelchair Mobility    Modified Rankin (Stroke Patients Only)       Balance     Sitting balance-Leahy Scale: Good       Standing balance-Leahy Scale: Fair                               Pertinent Vitals/Pain Pain Assessment: Faces Faces Pain Scale: Hurts whole lot Pain Location: R foot with any weight bearing as well as General body pain, chronic per patient Pain Descriptors / Indicators: Grimacing Pain Intervention(s): Monitored during session    Home Living Family/patient expects to be discharged to:: Private residence Living Arrangements: Spouse/significant other;Children Available Help at Discharge: Family;Available 24 hours/day(Adult son home during the day and able to assist) Type of Home: House Home Access: Stairs to enter Entrance Stairs-Rails: Right;Left;Can reach both Entrance Stairs-Number of Steps: 4 Home Layout: Two level;Able to live on main level with bedroom/bathroom Home Equipment: Kasandra Knudsen - single point      Prior Function Level of Independence: Independent with assistive device(s)         Comments: Recent decline in function over past month     Hand Dominance   Dominant  Hand: Right    Extremity/Trunk Assessment   Upper Extremity Assessment Upper Extremity Assessment: Defer to OT evaluation(Noted tremor bil UEs)    Lower Extremity Assessment Lower Extremity Assessment: Generalized weakness;RLE deficits/detail RLE Deficits / Details: Erythema and edema R foot, made worse with foot in dependent position; Painful with weight  bearing       Communication   Communication: No difficulties  Cognition Arousal/Alertness: Awake/alert Behavior During Therapy: WFL for tasks assessed/performed Overall Cognitive Status: Within Functional Limits for tasks assessed                                        General Comments      Exercises     Assessment/Plan    PT Assessment Patient needs continued PT services  PT Problem List Decreased range of motion;Decreased activity tolerance;Decreased balance;Decreased strength;Decreased mobility;Decreased coordination;Decreased knowledge of use of DME;Decreased knowledge of precautions;Pain;Impaired tone       PT Treatment Interventions DME instruction;Gait training;Stair training;Functional mobility training;Neuromuscular re-education;Balance training;Therapeutic exercise;Therapeutic activities;Patient/family education    PT Goals (Current goals can be found in the Care Plan section)  Acute Rehab PT Goals Patient Stated Goal: Improved strength and endurance; and clear up cellulitis PT Goal Formulation: With patient Time For Goal Achievement: 06/27/17 Potential to Achieve Goals: Good    Frequency Min 3X/week   Barriers to discharge        Co-evaluation               AM-PAC PT "6 Clicks" Daily Activity  Outcome Measure Difficulty turning over in bed (including adjusting bedclothes, sheets and blankets)?: A Little Difficulty moving from lying on back to sitting on the side of the bed? : A Little Difficulty sitting down on and standing up from a chair with arms (e.g., wheelchair, bedside commode, etc,.)?: A Lot Help needed moving to and from a bed to chair (including a wheelchair)?: A Little Help needed walking in hospital room?: A Lot Help needed climbing 3-5 steps with a railing? : A Lot 6 Click Score: 15    End of Session Equipment Utilized During Treatment: Gait belt Activity Tolerance: Patient limited by pain Patient left: in chair;with  call bell/phone within reach;with family/visitor present Nurse Communication: Mobility status PT Visit Diagnosis: Muscle weakness (generalized) (M62.81);Other symptoms and signs involving the nervous system (R29.898);Pain Pain - Right/Left: Right Pain - part of body: Ankle and joints of foot    Time: 1336-1400 PT Time Calculation (min) (ACUTE ONLY): 24 min   Charges:   PT Evaluation $PT Eval Moderate Complexity: 1 Mod PT Treatments $Therapeutic Activity: 8-22 mins   PT G Codes:        Roney Marion, PT  Acute Rehabilitation Services Pager [phone] Office [phone]   [place] 06/13/2017, 2:53 PM

## 2017-06-13 NOTE — Discharge Summary (Signed)
Dennis [name] Discharge Summary  Patient name: Dennis [name]. Medical record number: [id number] Date of birth: 02/14/1946 Age: 71 y.o. Gender: male Date of Admission: 06/12/2017  Date of Discharge: 06/15/17 Admitting Physician: Blane Ohara McDiarmid, MD  Primary Care Provider: Mar Daring, PA-C Consultants: none  Indication for Hospitalization: Right lower leg swelling  Discharge Diagnoses/Problem List:  RLE Cellulitis Addison's disease Parkinson's disease HTN H/o gout Hypothyroidism Hypokalemia OSA Depression HLD H/o previous CVA H/o chronic diastolic CHF H/o prostate and testicular cancer  Disposition: SNF  Discharge Condition: stable, improving  Discharge Exam:  Please see progress note from date of discharge   Brief [name] Course:  Dennis Coker Spade Sr. is a 71 y.o. male presenting with right lower leg edema. Patient was recently discharged [facility] on 11/29 for RLE cellulitis at which time he received 1 day IV Vanc, then switched to IV cefazolin x 1 day, and then discharged with oral keflex tid. Patient had worsening pain, redness, and edema despite compliance with antibiotics. Patient was admitted and started on IV cefazolin x 4 days. . Blood cultures remained negative. Along with IV antibiotics patient was treated as possible gout flare with colchicine, uric acid elevated at 7.8 on admission. Patient was also placed on trial of prednisone 40mg  daily, rather than home hydrocortisone.   Issues for Follow Up:  1. Will need increase in allopurinol dosing given acute flare despite compliance with daily allopurinol. Stopped during inpatient for acute flare, will need to restart after flare resolves.  2. Stop colchicine after acute flare  3. Prednisone stress dose x 5 days total (2 more days after discharge. Then transition to hydrocortisone  4. Stopped indomethacin  5. Continue antibiotics x 6 days (total 10 days on IV  abx)  Significant Procedures: none  Significant Labs and Imaging:  Recent Labs  Lab 06/13/17 0023 06/14/17 0607 06/15/17 0616  WBC 6.2 9.5 7.7  HGB 14.6 14.3 14.2  HCT 46.6 44.6 45.6  PLT 192 200 202   Recent Labs  Lab 06/12/17 1510 06/12/17 1804 06/13/17 0023 06/14/17 0607 06/15/17 0616  NA 136 139 132* 134* 134*  K 3.8 3.4* 3.9 3.7 3.4*  CL 103 98* 95* 95* 94*  CO2 21*  --  26 29 31   GLUCOSE 86 119* 125* 114* 113*  BUN 9 10 11 11 13   CREATININE 1.19 1.00 1.21 1.13 1.05  CALCIUM 9.5  --  9.0 8.9 9.0     Ref. Range 06/12/2017 17:14  Lactic Acid, Venous Latest Ref Range: 0.5 - 1.9 mmol/L 0.99    Ref. Range 06/12/2017 23:38  Uric Acid, Serum Latest Ref Range: 4.4 - 7.6 mg/dL 7.8 (H)    Dg Tibia/fibula Right  Result Date: 06/06/2017 CLINICAL DATA:  Cellulitis right lower extremity. EXAM: RIGHT TIBIA AND FIBULA - 2 VIEW COMPARISON:  None. FINDINGS: Soft tissue swelling diffusely throughout the right calf. No underlying acute bony abnormality. No fracture, subluxation or dislocation. No radiographic changes of osteomyelitis. Degenerative changes in the right knee. IMPRESSION: No acute bony abnormality. Electronically Signed   By: Rolm Baptise M.D.   On: 06/06/2017 11:30   US Venous Img Lower Unilateral Right  Result Date: 06/06/2017 CLINICAL DATA:  71 year old male with a history of right leg redness and swelling EXAM: RIGHT LOWER EXTREMITY VENOUS DOPPLER ULTRASOUND TECHNIQUE: Gray-scale sonography with graded compression, as well as color Doppler and duplex ultrasound were performed to evaluate the lower extremity deep venous systems from the level of  the common femoral vein and including the common femoral, femoral, profunda femoral, popliteal and calf veins including the posterior tibial, peroneal and gastrocnemius veins when visible. The superficial great saphenous vein was also interrogated. Spectral Doppler was utilized to evaluate flow at rest and with distal  augmentation maneuvers in the common femoral, femoral and popliteal veins. COMPARISON:  None. FINDINGS: Contralateral Common Femoral Vein: Respiratory phasicity is normal and symmetric with the symptomatic side. No evidence of thrombus. Normal compressibility. Common Femoral Vein: No evidence of thrombus. Normal compressibility, respiratory phasicity and response to augmentation. Saphenofemoral Junction: No evidence of thrombus. Normal compressibility and flow on color Doppler imaging. Profunda Femoral Vein: No evidence of thrombus. Normal compressibility and flow on color Doppler imaging. Femoral Vein: No evidence of thrombus. Normal compressibility, respiratory phasicity and response to augmentation. Popliteal Vein: No evidence of thrombus. Normal compressibility, respiratory phasicity and response to augmentation. Calf Veins: No evidence of thrombus. Normal compressibility and flow on color Doppler imaging. Superficial Great Saphenous Vein: No evidence of thrombus. Normal compressibility and flow on color Doppler imaging. Other Findings:  None. IMPRESSION: Sonographic survey of the right lower extremity negative for DVT Electronically Signed   By: Corrie Mckusick D.O.   On: 06/06/2017 12:03   Dg Chest Port 1 View  Result Date: 05/27/2017 CLINICAL DATA:  Shortness of Breath EXAM: PORTABLE CHEST 1 VIEW COMPARISON:  01/13/2017 FINDINGS: Cardiac shadow is enlarged. Loop recorder is again noted and stable. The lungs are well aerated bilaterally. No focal infiltrate or sizable effusion is seen. No bony abnormality is noted. IMPRESSION: No active disease. Electronically Signed   By: Inez Catalina M.D.   On: 05/27/2017 16:52   Dg Foot Complete Right  Result Date: 06/06/2017 CLINICAL DATA:  Cellulitis right lower extremity for 3 weeks EXAM: RIGHT FOOT COMPLETE - 3+ VIEW COMPARISON:  None. FINDINGS: Marked soft tissue swelling along the dorsum of the foot. Mild degenerative changes at the right first MTP joint.  Plantar calcaneal spur. No acute bony abnormality. No radiographic changes of osteomyelitis. IMPRESSION: Marked soft tissue swelling along the dorsum of the foot. No acute bony abnormality. Electronically Signed   By: Rolm Baptise M.D.   On: 06/06/2017 11:31    Results/Tests Pending at Time of Discharge:  Unresulted Labs (From admission, onward)   Start     Ordered   06/14/17 0500  CBC  Daily,   R     06/13/17 1209   06/14/17 6734  Basic metabolic panel  Daily,   R     06/13/17 1209      Discharge Medications:  Allergies as of 06/15/2017      Reactions   Bee Venom Anaphylaxis   Shrimp [shellfish Allergy] Anaphylaxis, Other (See Comments)   "just shrimp"   Stadol [butorphanol] Anaphylaxis, Other (See Comments)   respiratory  Distress, couldn't breathe, cardiac arrest   Wasp Venom Anaphylaxis      Medication List    STOP taking these medications   allopurinol 100 MG tablet Commonly known as:  ZYLOPRIM   cephALEXin 500 MG capsule Commonly known as:  KEFLEX   indomethacin 50 MG capsule Commonly known as:  INDOCIN     TAKE these medications   albuterol 108 (90 Base) MCG/ACT inhaler Commonly known as:  PROVENTIL HFA;VENTOLIN HFA Inhale 2 puffs into the lungs every 6 (six) hours as needed for wheezing or shortness of breath.   aspirin EC 81 MG tablet Take 81 mg by mouth daily.   atorvastatin 20 MG  tablet Commonly known as:  LIPITOR TAKE 1 TABLET(20 MG) BY MOUTH DAILY   carbidopa-levodopa 25-250 MG tablet Commonly known as:  SINEMET IR Take 2 tablets by mouth 4 (four) times daily.   carvedilol 3.125 MG tablet Commonly known as:  COREG Take 3.125 mg by mouth 2 (two) times daily with a meal.   ceFAZolin 2-4 GM/100ML-% IVPB Commonly known as:  ANCEF Inject 100 mLs (2 g total) into the vein every 8 (eight) hours for 6 days.   ciprofloxacin 500 MG tablet Commonly known as:  CIPRO Take 500 mg by mouth 2 (two) times daily as needed.   clopidogrel 75 MG  tablet Commonly known as:  PLAVIX Take 75 mg by mouth daily.   colchicine 0.6 MG tablet Take 1 tablet (0.6 mg total) by mouth 2 (two) times daily.   EPINEPHrine 0.3 mg/0.3 mL Soaj injection Commonly known as:  EPI-PEN Inject 0.3 mLs (0.3 mg total) into the muscle as needed (allergic reaction).   feeding supplement (ENSURE ENLIVE) Liqd Take 237 mLs by mouth 3 (three) times daily between meals.   furosemide 20 MG tablet Commonly known as:  LASIX Take 2 tablets (40 mg total) by mouth daily.   guaiFENesin-dextromethorphan 100-10 MG/5ML syrup Commonly known as:  ROBITUSSIN DM Take 5 mLs by mouth every 4 (four) hours as needed for cough.   hydrocortisone 10 MG tablet Commonly known as:  CORTEF 2 tablets in the morning and 1 tablet at 5 PM. Please start taking on 06/18/17 Start taking on:  06/18/2017 What changed:    additional instructions  These instructions start on 06/18/2017. If you are unsure what to do until then, ask your doctor or other care provider.   levothyroxine 112 MCG tablet Commonly known as:  SYNTHROID, LEVOTHROID TAKE 2 TABLETS(224 MCG) BY MOUTH DAILY BEFORE BREAKFAST What changed:  See the new instructions.   loratadine 10 MG tablet Commonly known as:  CLARITIN Take 1 tablet (10 mg total) by mouth daily.   losartan 100 MG tablet Commonly known as:  COZAAR TAKE 1 TABLET(100 MG) BY MOUTH DAILY   mometasone-formoterol 200-5 MCG/ACT Aero Commonly known as:  DULERA Inhale 2 puffs into the lungs 2 (two) times daily.   multivitamin with minerals Tabs tablet Take 1 tablet by mouth daily. Start taking on:  06/16/2017   oxyCODONE-acetaminophen 5-325 MG tablet Commonly known as:  PERCOCET Take 2 tablets by mouth every 4 (four) hours as needed.   polyethylene glycol packet Commonly known as:  MIRALAX / GLYCOLAX Take 17 g by mouth daily as needed for mild constipation.   predniSONE 20 MG tablet Commonly known as:  DELTASONE Take 2 tablets (40 mg total) by  mouth daily for 2 days. Please take on 06/16/17 and 06/17/17   sertraline 100 MG tablet Commonly known as:  ZOLOFT Take 100 mg by mouth daily.   sildenafil 20 MG tablet Commonly known as:  REVATIO Take 20 mg by mouth.   testosterone cypionate 200 MG/ML injection Commonly known as:  DEPO-TESTOSTERONE Inject 0.75 mLs (150 mg total) into the muscle every 14 (fourteen) days.       Discharge Instructions: Please refer to Patient Instructions section of EMR for full details.  Patient was counseled important signs and symptoms that should prompt return to medical care, changes in medications, dietary instructions, activity restrictions, and follow up appointments.   Follow-Up Appointments:  Contact information for follow-up providers    Mar Daring, PA-C. Go in 2 day(s).   Specialty:  Family Medicine Why:  Please call and make appointment with your PCP within 1 week of discharge Contact information: [address] [place] [address] [phone]            Contact information for after-discharge care    Destination    [facility] .   Service:  Skilled Nursing Contact information: [place] Dexter Ridgely, Flora, DO 06/15/2017, 4:10 PM PGY-2, [place]

## 2017-06-13 NOTE — Consult Note (Signed)
   [facility] CM Inpatient Consult   06/13/2017  Yacob Wilkerson Giammarino Sr. Jan 07, 1946 [id number]   Patient assessed for re-admission. Patient is currently active with [facility] for chronic disease management services.  Patient has been engaged by a [employer].  Patient is in the Medicare ACO.  Met with the patient and wife regarding ongoing [facility] needs at the bedside.   Consent form signed and folder with information given.  Our community based plan of care has focused on disease management and community resource support.  Wife states she is hopeful for the patient to have a skilled rehab stay.  She states she is getting equipment from the [employer]. She and patient expresses ongoing [facility] follow up. His primary care provider is Ramos, PAC.  Of note, [facility] Management services does not replace or interfere with any services that are needed or arranged by inpatient case management or social work.  For additional questions or referrals please contact:  Natividad Brood, RN BSN [facility] Liaison  [phone] business mobile phone Toll free office [phone]

## 2017-06-13 NOTE — Progress Notes (Signed)
Initial Nutrition Assessment  DOCUMENTATION CODES:   Morbid obesity  INTERVENTION:   -Ensure Enlive po TID, each supplement provides 350 kcal and 20 grams of protein -MVI daily   NUTRITION DIAGNOSIS:   Inadequate oral intake related to decreased appetite, early satiety(taste changes) as evidenced by meal completion < 50%, per patient/family report.  GOAL:   Patient will meet greater than or equal to 90% of their needs  MONITOR:   PO intake, Supplement acceptance, Diet advancement, Labs, Weight trends, Skin, I & O's  REASON FOR ASSESSMENT:   Malnutrition Screening Tool    ASSESSMENT:   Dennis Kochan Linehan Sr. is a 71 y.o. male presenting with RLE cellulitis. PMH is significant for known history of previous CVA, Addison's disease, history of testicular cancer, history of prostate cancer, gout, hypertension, hyperlipidemia, hypothyroidism, OSA, Parkinson's disease.  Pt admitted with RLE cellulitis.   Spoke with pt and wife at bedside. Pt reports a general decline in health over the past 6 weeks, related to recent Parkinson's diagnosis. Pt shares he consumes 1-2 meals per day, but often only consumes small bites and sips at each meal. He has no desire to eat related to early satiety and more recently, taste changes ("so many things don't taste right"). He reports he ate a few bites of eggs and bacon at breakfast this morning- also observed pt with lunch meal, where he consumed 2-3 bites of chicken and dumplings and one bite of fruit cocktail.   Pt wife shares that she is very concerned about pt's poor nutritional status. She confirms that pt has been eating very little at home; she has been giving pt chocolate Ensure shakes at home, which he accepts.   Pt with dramatic mobility changes as well- he gets around with assistance from his wife and son at home. Wife shares that they are awaiting mobility equipment from the [place] to assist family members with moving pt, due to his large stature.    Discussed importance of good meal and supplement intake to promote healing.   Labs reviewed: Na: 132  NUTRITION - FOCUSED PHYSICAL EXAM:    Most Recent Value  Orbital Region  No depletion  Upper Arm Region  No depletion  Thoracic and Lumbar Region  No depletion  Buccal Region  No depletion  Temple Region  No depletion  Clavicle Bone Region  No depletion  Clavicle and Acromion Bone Region  No depletion  Scapular Bone Region  No depletion  Dorsal Hand  No depletion  Patellar Region  No depletion  Anterior Thigh Region  No depletion  Posterior Calf Region  No depletion  Edema (RD Assessment)  Moderate  Hair  Reviewed  Eyes  Reviewed  Mouth  Reviewed  Skin  Reviewed  Nails  Reviewed       Diet Order:  Diet Heart Room service appropriate? Yes; Fluid consistency: Thin  EDUCATION NEEDS:   Education needs have been addressed  Skin:  Skin Assessment: Skin Integrity Issues: Skin Integrity Issues:: Other (Comment) Other: rt lower leg cellulitis  Last BM:  06/12/17  Height:   Ht Readings from Last 1 Encounters:  06/12/17 6\' 7"  (2.007 m)    Weight:   Wt Readings from Last 1 Encounters:  06/12/17 (!) 365 lb (165.6 kg)    Ideal Body Weight:  100 kg  BMI:  Body mass index is 41.12 kg/m.  Estimated Nutritional Needs:   Kcal:  1850-2050  Protein:  125-150 grams  Fluid:  1.8-2.0 L  Kamilya Wakeman A. Jimmye Norman, RD, LDN, CDE Pager: [phone] After hours Pager: [phone]

## 2017-06-13 NOTE — Progress Notes (Signed)
Pt has his home unit CPAP. Pt will placed on self once he is ready for bed. No help needed he stated. Pt is stable at this time.

## 2017-06-13 NOTE — Evaluation (Signed)
Occupational Therapy Evaluation Patient Details Name: Dennis Pindell Rabinovich Sr. MRN: [id number] DOB: [birth year] Today's Date: 06/13/2017    History of Present Illness Dennis Hausmann Wellbrock Sr. is a 71 y.o. male presenting with RLE cellulitis. PMH is significant for known history of previous CVA, Addison's disease, history of testicular cancer, history of prostate cancer, gout, hypertension, hyperlipidemia, hypothyroidism, OSA, Parkinson's disease.   Clinical Impression   Pt was ambulating with a cane and able to perform self care modified independently prior to admission with the exception of donning and doffing his socks. He demonstrates decreased activity tolerance this visit, much safer with use of walker. Pt feels his strength and functional status has declined greatly since October and needs post acute rehab. Will follow acutely.    Follow Up Recommendations  SNF    Equipment Recommendations       Recommendations for Other Services       Precautions / Restrictions Precautions Precautions: Fall Restrictions Weight Bearing Restrictions: No      Mobility Bed Mobility Overal bed mobility: Needs Assistance Bed Mobility: Sit to Supine     Supine to sit: Supervision Sit to supine: Supervision   General bed mobility comments: Cues for technqiue; inefficient movement  Transfers Overall transfer level: Needs assistance Equipment used: None Transfers: Sit to/from Stand Sit to Stand: Min guard         General transfer comment: Noted good power up; Very painful; minguard for safety    Balance Overall balance assessment: Needs assistance   Sitting balance-Leahy Scale: Good       Standing balance-Leahy Scale: Fair Standing balance comment: can release walker in standing                           ADL either performed or assessed with clinical judgement   ADL Overall ADL's : Needs assistance/impaired Eating/Feeding: Independent;Sitting   Grooming: Wash/dry  hands;Standing;Min guard   Upper Body Bathing: Set up;Sitting   Lower Body Bathing: Sit to/from stand;Moderate assistance   Upper Body Dressing : Set up;Sitting   Lower Body Dressing: Maximal assistance;Minimal assistance;Sit to/from stand Lower Body Dressing Details (indicate cue type and reason): max for socks Toilet Transfer: Minimal assistance;Min guard;Ambulation;RW Toilet Transfer Details (indicate cue type and reason): walked without walker to bathroom, very unsafe, gave walker for getting back to bed Toileting- Clothing Manipulation and Hygiene: Min guard(standing)       Functional mobility during ADLs: Min guard;Minimal assistance;Rolling walker(min guard with walker, min without)       Vision Baseline Vision/History: Wears glasses Wears Glasses: At all times Patient Visual Report: No change from baseline       Perception     Praxis      Pertinent Vitals/Pain Pain Assessment: Faces Faces Pain Scale: Hurts even more Pain Location: R foot with any weight bearing as well as General body pain, chronic per patient Pain Descriptors / Indicators: Grimacing;Guarding Pain Intervention(s): Monitored during session;Repositioned     Hand Dominance Right   Extremity/Trunk Assessment Upper Extremity Assessment Upper Extremity Assessment: RUE deficits/detail;LUE deficits/detail RUE Deficits / Details: tremor RUE Coordination: decreased fine motor;decreased gross motor LUE Deficits / Details: tremor LUE Coordination: decreased fine motor;decreased gross motor   Lower Extremity Assessment Lower Extremity Assessment: Defer to PT evaluation RLE Deficits / Details: Erythema and edema R foot, made worse with foot in dependent position; Painful with weight bearing   Cervical / Trunk Assessment Cervical / Trunk Assessment: Other exceptions Cervical / Trunk  Exceptions: hx of multiple back surgeries   Communication Communication Communication: No difficulties   Cognition  Arousal/Alertness: Awake/alert Behavior During Therapy: Impulsive Overall Cognitive Status: Within Functional Limits for tasks assessed                                 General Comments: somewhat poor awareness of safety, fall risk   General Comments       Exercises     Shoulder Instructions      Home Living Family/patient expects to be discharged to:: Private residence Living Arrangements: Spouse/significant other;Children Available Help at Discharge: Family;Available 24 hours/day(adult son home during the day) Type of Home: House Home Access: Stairs to enter [employer] of Steps: 4 Entrance Stairs-Rails: Right;Left;Can reach both Home Layout: Two level;Able to live on main level with bedroom/bathroom Alternate Level Stairs-Number of Steps: flight Alternate Level Stairs-Rails: Right;Left Bathroom Shower/Tub: Occupational psychologist: Handicapped height Bathroom Accessibility: Yes   Home Equipment: [place] - single point;Shower seat;Grab bars - tub/shower   Additional Comments: VA working on bars for toilet, U walker and motorized w/c      Prior Functioning/Environment Level of Independence: Independent with assistive device(s)        Comments: Recent decline in function over past month, avoids socks        OT Problem List: Impaired balance (sitting and/or standing);Decreased safety awareness;Decreased knowledge of use of DME or AE;Pain;Obesity      OT Treatment/Interventions: Self-care/ADL training;DME and/or AE instruction;Balance training    OT Goals(Current goals can be found in the care plan section) Acute Rehab OT Goals Patient Stated Goal: Improved strength and endurance; and clear up cellulitis OT Goal Formulation: With patient Time For Goal Achievement: 06/27/17 Potential to Achieve Goals: Good ADL Goals Pt Will Perform Grooming: with modified independence;standing Pt Will Perform Lower Body Bathing: with modified  independence;with adaptive equipment;sit to/from stand Pt Will Perform Lower Body Dressing: with modified independence;with adaptive equipment;sit to/from stand Pt Will Transfer to Toilet: with modified independence;ambulating Pt Will Perform Toileting - Clothing Manipulation and hygiene: with modified independence;sit to/from stand Pt Will Perform Tub/Shower Transfer: ambulating;shower seat;rolling walker;with supervision  OT Frequency: Min 2X/week   Barriers to D/C:            Co-evaluation              AM-PAC PT "6 Clicks" Daily Activity     Outcome Measure Help from another person eating meals?: None Help from another person taking care of personal grooming?: A Little Help from another person toileting, which includes using toliet, bedpan, or urinal?: A Little Help from another person bathing (including washing, rinsing, drying)?: A Little Help from another person to put on and taking off regular upper body clothing?: None Help from another person to put on and taking off regular lower body clothing?: A Lot 6 Click Score: 19   End of Session Equipment Utilized During Treatment: Gait belt;Rolling walker  Activity Tolerance: Patient tolerated treatment well Patient left: in bed;with call bell/phone within reach;with family/visitor present  OT Visit Diagnosis: Unsteadiness on feet (R26.81)                Time: 1535-1600 OT Time Calculation (min): 25 min Charges:  OT General Charges $OT Visit: 1 Visit OT Evaluation $OT Eval Moderate Complexity: 1 Mod OT Treatments $Self Care/Home Management : 8-22 mins G-Codes:     07-08-2017 Nestor Lewandowsky, OTR/L Pager:  [phone]  Malka So 06/13/2017, 4:18 PM

## 2017-06-14 ENCOUNTER — Ambulatory Visit: Payer: Medicare Other | Admitting: Neurology

## 2017-06-14 ENCOUNTER — Inpatient Hospital Stay: Payer: Medicare Other | Admitting: Physician Assistant

## 2017-06-14 DIAGNOSIS — M109 Gout, unspecified: Secondary | ICD-10-CM

## 2017-06-14 DIAGNOSIS — M7989 Other specified soft tissue disorders: Secondary | ICD-10-CM

## 2017-06-14 DIAGNOSIS — L03115 Cellulitis of right lower limb: Principal | ICD-10-CM

## 2017-06-14 LAB — BASIC METABOLIC PANEL
Anion gap: 10 (ref 5–15)
BUN: 11 mg/dL (ref 6–20)
CO2: 29 mmol/L (ref 22–32)
Calcium: 8.9 mg/dL (ref 8.9–10.3)
Chloride: 95 mmol/L — ABNORMAL LOW (ref 101–111)
Creatinine, Ser: 1.13 mg/dL (ref 0.61–1.24)
GFR calc Af Amer: >60 mL/min (ref >60)
GFR calc non Af Amer: >60 mL/min (ref >60)
Glucose, Bld: 114 mg/dL — ABNORMAL HIGH (ref 65–99)
Potassium: 3.7 mmol/L (ref 3.5–5.1)
Sodium: 134 mmol/L — ABNORMAL LOW (ref 135–145)

## 2017-06-14 LAB — CBC
HCT: 44.6 % (ref 39.0–52.0)
Hemoglobin: 14.3 g/dL (ref 13.0–17.0)
MCH: 26.4 pg (ref 26.0–34.0)
MCHC: 32.1 g/dL (ref 30.0–36.0)
MCV: 82.4 fL (ref 78.0–100.0)
Platelets: 200 10*3/uL (ref 150–400)
RBC: 5.41 MIL/uL (ref 4.22–5.81)
RDW: 13.5 % (ref 11.5–15.5)
WBC: 9.5 10*3/uL (ref 4.0–10.5)

## 2017-06-14 MED ORDER — TESTOSTERONE CYPIONATE 200 MG/ML IM SOLN
150.0000 mg | INTRAMUSCULAR | Status: DC
  Administered 2017-06-14: 150 mg via INTRAMUSCULAR
  Filled 2017-06-14: qty 0.75

## 2017-06-14 MED ORDER — CEFAZOLIN SODIUM-DEXTROSE 2-4 GM/100ML-% IV SOLN
2.0000 g | Freq: Three times a day (TID) | INTRAVENOUS | Status: DC
  Administered 2017-06-14 – 2017-06-15 (×4): 2 g via INTRAVENOUS
  Filled 2017-06-14 (×6): qty 100

## 2017-06-14 MED ORDER — CEPHALEXIN 500 MG PO CAPS: 500.0000 mg | ORAL_CAPSULE | Freq: Four times a day (QID) | ORAL | Status: DC

## 2017-06-14 MED ORDER — TESTOSTERONE CYPIONATE 200 MG/ML IM SOLN
150.0000 mg | INTRAMUSCULAR | Status: DC
  Filled 2017-06-14: qty 0.75

## 2017-06-14 NOTE — NC FL2 (Signed)
[place] LEVEL OF CARE SCREENING TOOL     IDENTIFICATION  Patient Name: Dennis [name]. Birthdate: Jul 18, 1945 Sex: male Admission Date (Current Location): 06/12/2017  [facility] and [place] Number:  Herbalist and Address:  [facility] [place], [place] [address] [place], [place] [address]      Provider Number: [id number]  Attending Physician Name and Address:  McDiarmid, Blane Ohara, MD  Relative Name and Phone Number:  Melecio Cueto, wife, [phone]    Current Level of Care: Hospital Recommended Level of Care: [place] Prior Approval Number:    Date Approved/Denied:   PASRR Number: [id number]  Discharge Plan: SNF    Current Diagnoses: Patient Active Problem List   Diagnosis Date Noted  . Hyperuricemia 06/13/2017  . Swelling of right foot 06/13/2017  . Acute gouty arthritis 06/13/2017  . Cellulitis of right leg   . Pain and swelling of right lower leg   . Cellulitis 06/06/2017  . Primary Parkinsonism ([place]) 05/19/2017  . History of prostate cancer 02/10/2017  . S/P prostatectomy 02/10/2017  . Spontaneous bruising 02/08/2017  . History of sepsis 12/11/2016  . History of pneumonia 12/11/2016  . Acute on chronic kidney failure ([place]) 12/11/2016  . History of stroke   . Ischemic stroke ([place]) 11/15/2016  . Localized swelling of lower extremity 11/15/2016  . History of cellulitis 11/15/2016  . Addison disease ([place]) 11/15/2016  . Hypogonadotropic hypogonadism ([place]) 06/09/2016  . Adrenal insufficiency ([place]) 03/18/2016  . Vertigo 03/17/2016  . AKI (acute kidney injury) ([place])   . Stroke (cerebrum) ([place])   . Essential hypertension   . Hypotension due to drugs   . Palpitations   . Acquired hypothyroidism 11/04/2015  . Arthritis 11/04/2015  . Decreased libido 11/04/2015  . Narrowing of intervertebral disc space 11/04/2015  . Major depressive disorder, single episode, mild ([place]) 11/04/2015  . H/o Lyme disease  11/04/2015  . Hypercholesterolemia 11/04/2015  . Morbid obesity ([place]) 11/04/2015  . Temporary cerebral vascular dysfunction 11/04/2015  . Chronic tophaceous gout 09/21/2015  . Blood glucose elevated 06/02/2015  . Sleep apnea 08/03/2014  . Chronic diastolic CHF (congestive heart failure) ([place]) 08/03/2014  . CAD (coronary artery disease) 08/02/2014  . Bursitis, trochanteric 05/15/2014  . S/P lumbar spinal fusion 07/17/2013  . Chronic back pain     Orientation RESPIRATION BLADDER Height & Weight     Self, Time, Situation, Place  Normal Continent Weight: (!) 365 lb (165.6 kg) Height:  6\' 7"  (200.7 cm)  BEHAVIORAL SYMPTOMS/MOOD NEUROLOGICAL BOWEL NUTRITION STATUS      Continent Diet  AMBULATORY STATUS COMMUNICATION OF NEEDS Skin   Extensive Assist Verbally Normal                       Personal Care Assistance Level of Assistance  Bathing, Feeding, Dressing Bathing Assistance: Maximum assistance Feeding assistance: Independent Dressing Assistance: Limited assistance     Functional Limitations Info  Sight, Hearing, Speech Sight Info: Adequate Hearing Info: Adequate Speech Info: Adequate    SPECIAL CARE FACTORS FREQUENCY  PT (By licensed PT), OT (By licensed OT)     PT Frequency: 5x week OT Frequency: 5x week            Contractures Contractures Info: Not present    Additional Factors Info  Code Status, Allergies, Psychotropic Code Status Info: Full Code Allergies Info: BEE VENOM, SHRIMP SHELLFISH ALLERGY, STADOL BUTORPHANOL, WASP VENOM  Psychotropic Info: Zoloft(100mg  daily)  Current Medications (06/14/2017):  This is the current hospital active medication list Current Facility-Administered Medications  Medication Dose Route Frequency Provider Last Rate Last Dose  . acetaminophen (TYLENOL) tablet 650 mg  650 mg Oral Q6H PRN Nicolette Bang, DO       Or  . acetaminophen (TYLENOL) suppository 650 mg  650 mg Rectal Q6H PRN Nicolette Bang, DO      . aspirin EC tablet 81 mg  81 mg Oral Daily Nicolette Bang, DO   81 mg at 06/14/17 1022  . atorvastatin (LIPITOR) tablet 20 mg  20 mg Oral q1800 Nicolette Bang, DO   20 mg at 06/13/17 1730  . carbidopa-levodopa (SINEMET IR) 25-250 MG per tablet immediate release 2 tablet  2 tablet Oral QID Daleen Bo, MD   2 tablet at 06/14/17 1022  . carvedilol (COREG) tablet 3.125 mg  3.125 mg Oral BID WC Nicolette Bang, DO   3.125 mg at 06/14/17 1638  . ceFAZolin (ANCEF) IVPB 2g/100 mL premix  2 g Intravenous Q8H Nicolette Bang, DO   Stopped at 06/14/17 [phone]  . clopidogrel (PLAVIX) tablet 75 mg  75 mg Oral Daily Nicolette Bang, DO   75 mg at 06/14/17 1022  . colchicine tablet 0.6 mg  0.6 mg Oral BID Lucila Maine C, DO   0.6 mg at 06/14/17 1021  . enoxaparin (LOVENOX) injection 80 mg  80 mg Subcutaneous Q24H Reginia Naas, RPH   80 mg at 06/14/17 1022  . feeding supplement (ENSURE ENLIVE) (ENSURE ENLIVE) liquid 237 mL  237 mL Oral TID BM McDiarmid, Blane Ohara, MD   237 mL at 06/14/17 1028  . furosemide (LASIX) tablet 40 mg  40 mg Oral Daily Nicolette Bang, DO   40 mg at 06/14/17 1022  . losartan (COZAAR) tablet 100 mg  100 mg Oral Daily Nicolette Bang, DO   100 mg at 06/14/17 1021  . multivitamin with minerals tablet 1 tablet  1 tablet Oral Daily McDiarmid, Blane Ohara, MD   1 tablet at 06/14/17 1022  . polyethylene glycol (MIRALAX / GLYCOLAX) packet 17 g  17 g Oral Daily PRN Nicolette Bang, DO      . predniSONE (DELTASONE) tablet 40 mg  40 mg Oral QAC breakfast McDiarmid, Blane Ohara, MD   40 mg at 06/14/17 0835  . sertraline (ZOLOFT) tablet 100 mg  100 mg Oral Daily Nicolette Bang, DO   100 mg at 06/14/17 1021  . testosterone cypionate (DEPOTESTOSTERONE CYPIONATE) injection 150 mg  150 mg Intramuscular Q14 Days Caroline More, DO         Discharge Medications: Please see discharge summary for a  list of discharge medications.  Relevant Imaging Results:  Relevant Lab Results:   Additional Information SS# [place], [place]

## 2017-06-14 NOTE — Progress Notes (Signed)
Physical Therapy Treatment Patient Details Name: Dennis Mckoy Anguiano Sr. MRN: [id number] DOB: 03-22-1946 Today's Date: 06/14/2017    History of Present Illness Dennis Mccaskey Kirshner Sr. is a 71 y.o. male presenting with RLE cellulitis. PMH is significant for known history of previous CVA, Addison's disease, history of testicular cancer, history of prostate cancer, gout, hypertension, hyperlipidemia, hypothyroidism, OSA, Parkinson's disease.    PT Comments    Pt is progressing towards goals, however, continues to be limited secondary to pain. Tolerated increased gait in room, however, required standing rests secondary to pain. RLE continues to be sensitive to touch as well and red secondary to RLE cellulitis. Pt reports he is wanting to go to [facility] in [place]. Current recommendations remain appropriate. Will continue to follow acutely.    Follow Up Recommendations  SNF(for short term rehab )     Equipment Recommendations  Other (comment)    Recommendations for Other Services       Precautions / Restrictions Precautions Precautions: Fall Restrictions Weight Bearing Restrictions: No    Mobility  Bed Mobility Overal bed mobility: Needs Assistance Bed Mobility: Supine to Sit       Sit to supine: Supervision   General bed mobility comments: Supervision for safety.   Transfers Overall transfer level: Needs assistance Equipment used: Rolling walker (2 wheeled) Transfers: Sit to/from Stand Sit to Stand: Min guard         General transfer comment: Min guard for safety. Pain reported in RLE.   Ambulation/Gait Ambulation/Gait assistance: Min assist;Min guard Ambulation Distance (Feet): 10 Feet Assistive device: Rolling walker (2 wheeled) Gait Pattern/deviations: Decreased stance time - right;Decreased step length - left;Antalgic;Trunk flexed Gait velocity: Decreased Gait velocity interpretation: Below normal speed for age/gender General Gait Details: Painful RLE during  gait. Limited distance secondary to pain. Required standing rest secondary to pain. Reliant on UE to offweight RLE.    Stairs            Wheelchair Mobility    Modified Rankin (Stroke Patients Only)       Balance Overall balance assessment: Needs assistance Sitting-balance support: No upper extremity supported;Feet supported Sitting balance-Leahy Scale: Good     Standing balance support: Bilateral upper extremity supported Standing balance-Leahy Scale: Poor Standing balance comment: Reliant on RW for stability                             Cognition Arousal/Alertness: Awake/alert Behavior During Therapy: WFL for tasks assessed/performed Overall Cognitive Status: Within Functional Limits for tasks assessed                                        Exercises General Exercises - Lower Extremity Ankle Circles/Pumps: AROM;Both;10 reps Quad Sets: AROM;Both;10 reps Heel Slides: AROM;Left;10 reps(unable to perform on RLE )    General Comments        Pertinent Vitals/Pain Pain Assessment: Faces Faces Pain Scale: Hurts even more Pain Location: R foot with any weight bearing as well as General body pain, chronic per patient Pain Descriptors / Indicators: Grimacing;Guarding Pain Intervention(s): Limited activity within patient's tolerance;Monitored during session;Repositioned    Home Living                      Prior Function            PT Goals (current goals can  now be found in the care plan section) Acute Rehab PT Goals Patient Stated Goal: Improved strength and endurance; and clear up cellulitis PT Goal Formulation: With patient Time For Goal Achievement: 06/27/17 Potential to Achieve Goals: Good Progress towards PT goals: Progressing toward goals    Frequency    Min 3X/week      PT Plan Current plan remains appropriate    Co-evaluation              AM-PAC PT "6 Clicks" Daily Activity  Outcome Measure   Difficulty turning over in bed (including adjusting bedclothes, sheets and blankets)?: A Little Difficulty moving from lying on back to sitting on the side of the bed? : A Little Difficulty sitting down on and standing up from a chair with arms (e.g., wheelchair, bedside commode, etc,.)?: Unable Help needed moving to and from a bed to chair (including a wheelchair)?: A Little Help needed walking in hospital room?: A Little Help needed climbing 3-5 steps with a railing? : A Lot 6 Click Score: 15    End of Session Equipment Utilized During Treatment: Gait belt Activity Tolerance: Patient limited by pain Patient left: in chair;with call bell/phone within reach;with family/visitor present Nurse Communication: Mobility status PT Visit Diagnosis: Muscle weakness (generalized) (M62.81);Other symptoms and signs involving the nervous system (R29.898);Pain Pain - Right/Left: Right Pain - part of body: Ankle and joints of foot     Time: 9562-1308 PT Time Calculation (min) (ACUTE ONLY): 19 min  Charges:  $Gait Training: 8-22 mins                    G Codes:       Leighton Ruff, PT, DPT  Acute Rehabilitation Services  Pager: [phone]    Rudean Hitt 06/14/2017, 12:17 PM

## 2017-06-14 NOTE — Progress Notes (Signed)
Family Medicine Teaching Service Daily Progress Note Intern Pager: [phone]  Patient name: Dennis [name]. Medical record number: [id number] Date of birth: 07-13-1945 Age: 71 y.o. Gender: male  Primary Care Provider: Mar Daring, PA-C Consultants: none Code Status: full   Pt Overview and Major Events to Date:  Admitted to [facility] on 06/12/17  Assessment and Plan: Nolon Nations Kalt [name].is a 71 y.o.malepresenting with RLE cellulitis.PMH is significant for known history of previous CVA, Addison's disease, history of testicular cancer, history of prostate cancer, gout, hypertension, hyperlipidemia, hypothyroidism,OSA, Parkinson's disease.  RLE Cellulitis Recently on antibiotic regiment for treatment. Afebrile and hemodynamically stable. LA was wnl.WBC 6.7.Considered DVT as cause of presentation; however preliminary dopplers negative. Could also consider gout flare as mimic of cellulitis given one significantly erythematous toe. Patient notes improvement in color and decrease in pain.  - continue IV cefazolin 200 mL/hr, q8hr (d#3), consider transition to oral Keflex on 12/6 - Tylenol q6 prn for pain; avoid additional NSAIDs  - Blood cx NGTD - Elevate legs -PT/OT to evaluate and treat - continue colchicine  - trial of prednisone 40mg  daily   Addison's Disease: - Continue home Solu-Cortef  Parkinson's Disease:Diagnosed on October 27. - Continue Sinemet.  - Followed by Dr. Brigitte Pulse  HTN Hypertensive on admission, improving. Currently normotensive at 126/73 - Continue home carvedilol, losartan.  H/o gout Usually occurs in first MTP and in MCP joints.Potential for acute flare, see above. Uric acid slightly elevated to 7.8.  - discontinue allopurinol during acute flare  - Continue colchicine for possible gout flare  Hypothyroidism: - Continue home synthroid  Hypokalemia: Resolved. K of 3.9 on 06/13/17 -continue to monitor   OSA: - CPAP at  night  Depression: - Continue home Zoloft  HLD: - Continue home atorvastatin; patient is only on 20 mg and would likely benefit from high intensity statin given h/o CVA  H/oof previous CVA: -Continue home aspirin 81 mg and Plavix 75 mg daily  H/o chronic diastolic CHF:  -Continue home Lasix, carvedilol, losartan -clarify Lasix dose tomorrow; patient reported 40 mg daily but EMR reports 40 mg BID  H/o prostate and testicular cancer: - On testosterone every 2 weeks - restarted testosterone while inpatient   FEN/GI:Heart Healthy diet Prophylaxis:Lovenox  Disposition: likely SNF  Subjective:  Patient today states he feels much better. States his foot "looks better" and pain is decreased. No pain at rest and only pain with ambulation. Patient says color is improving. Interested in SNF placement but would like to talk further with social work regarding placement.   Objective: Temp:  [97.6 F (36.4 C)-98.7 F (37.1 C)] 97.6 F (36.4 C) (12/05 0429) Pulse Rate:  [66-72] 66 (12/05 0835) Resp:  [18] 18 (12/05 0429) BP: (121-127)/(63-84) 121/63 (12/05 0835) SpO2:  [95 %-98 %] 95 % (12/05 0429) Physical Exam: General: awake and alert, sitting in chair, NAD Cardiovascular: RRR, no MRG Respiratory: CTAB, no wheezes, rales, or rhonchi  Abdomen: soft, non tender, distended, bowel sounds normal  Extremities: edema bilaterally in lower extremities, erythema of left shin and right shin and foot. Minimal decrease in erythema compared to margin lines but improvement in edema of right foot. Decreased tenderness but tenderness at top of foot.   Laboratory: Recent Labs  Lab 06/12/17 1510 06/12/17 1804 06/13/17 0023 06/14/17 0607  WBC 6.7  --  6.2 9.5  HGB 15.4 16.0 14.6 14.3  HCT 48.9 47.0 46.6 44.6  PLT 207  --  192 200   Recent Labs  Lab 06/12/17 1510 06/12/17 1804 06/13/17 0023 06/14/17 0607  NA 136 139 132* 134*  K 3.8 3.4* 3.9 3.7  CL 103 98* 95* 95*  CO2 21*   --  26 29  BUN 9 10 11 11   CREATININE 1.19 1.00 1.21 1.13  CALCIUM 9.5  --  9.0 8.9  GLUCOSE 86 119* 125* 114*    Imaging/Diagnostic Tests: Dg Tibia/fibula Right  Result Date: 06/06/2017 CLINICAL DATA:  Cellulitis right lower extremity. EXAM: RIGHT TIBIA AND FIBULA - 2 VIEW COMPARISON:  None. FINDINGS: Soft tissue swelling diffusely throughout the right calf. No underlying acute bony abnormality. No fracture, subluxation or dislocation. No radiographic changes of osteomyelitis. Degenerative changes in the right knee. IMPRESSION: No acute bony abnormality. Electronically Signed   By: Rolm Baptise M.D.   On: 06/06/2017 11:30   US Venous Img Lower Unilateral Right  Result Date: 06/06/2017 CLINICAL DATA:  71 year old male with a history of right leg redness and swelling EXAM: RIGHT LOWER EXTREMITY VENOUS DOPPLER ULTRASOUND TECHNIQUE: Gray-scale sonography with graded compression, as well as color Doppler and duplex ultrasound were performed to evaluate the lower extremity deep venous systems from the level of the common femoral vein and including the common femoral, femoral, profunda femoral, popliteal and calf veins including the posterior tibial, peroneal and gastrocnemius veins when visible. The superficial great saphenous vein was also interrogated. Spectral Doppler was utilized to evaluate flow at rest and with distal augmentation maneuvers in the common femoral, femoral and popliteal veins. COMPARISON:  None. FINDINGS: Contralateral Common Femoral Vein: Respiratory phasicity is normal and symmetric with the symptomatic side. No evidence of thrombus. Normal compressibility. Common Femoral Vein: No evidence of thrombus. Normal compressibility, respiratory phasicity and response to augmentation. Saphenofemoral Junction: No evidence of thrombus. Normal compressibility and flow on color Doppler imaging. Profunda Femoral Vein: No evidence of thrombus. Normal compressibility and flow on color Doppler  imaging. Femoral Vein: No evidence of thrombus. Normal compressibility, respiratory phasicity and response to augmentation. Popliteal Vein: No evidence of thrombus. Normal compressibility, respiratory phasicity and response to augmentation. Calf Veins: No evidence of thrombus. Normal compressibility and flow on color Doppler imaging. Superficial Great Saphenous Vein: No evidence of thrombus. Normal compressibility and flow on color Doppler imaging. Other Findings:  None. IMPRESSION: Sonographic survey of the right lower extremity negative for DVT Electronically Signed   By: Corrie Mckusick D.O.   On: 06/06/2017 12:03   Dg Chest Port 1 View  Result Date: 05/27/2017 CLINICAL DATA:  Shortness of Breath EXAM: PORTABLE CHEST 1 VIEW COMPARISON:  01/13/2017 FINDINGS: Cardiac shadow is enlarged. Loop recorder is again noted and stable. The lungs are well aerated bilaterally. No focal infiltrate or sizable effusion is seen. No bony abnormality is noted. IMPRESSION: No active disease. Electronically Signed   By: Inez Catalina M.D.   On: 05/27/2017 16:52   Dg Foot Complete Right  Result Date: 06/06/2017 CLINICAL DATA:  Cellulitis right lower extremity for 3 weeks EXAM: RIGHT FOOT COMPLETE - 3+ VIEW COMPARISON:  None. FINDINGS: Marked soft tissue swelling along the dorsum of the foot. Mild degenerative changes at the right first MTP joint. Plantar calcaneal spur. No acute bony abnormality. No radiographic changes of osteomyelitis. IMPRESSION: Marked soft tissue swelling along the dorsum of the foot. No acute bony abnormality. Electronically Signed   By: Rolm Baptise M.D.   On: 06/06/2017 11:31     Caroline More, DO 06/14/2017, 12:25 PM PGY-1, [place] Intern pager: [phone], text pages  welcome

## 2017-06-15 ENCOUNTER — Other Ambulatory Visit: Payer: Self-pay | Admitting: *Deleted

## 2017-06-15 ENCOUNTER — Other Ambulatory Visit: Payer: Self-pay | Admitting: Pharmacist

## 2017-06-15 DIAGNOSIS — Z6841 Body Mass Index (BMI) 40.0 and over, adult: Secondary | ICD-10-CM | POA: Diagnosis not present

## 2017-06-15 DIAGNOSIS — R41841 Cognitive communication deficit: Secondary | ICD-10-CM | POA: Diagnosis not present

## 2017-06-15 DIAGNOSIS — J449 Chronic obstructive pulmonary disease, unspecified: Secondary | ICD-10-CM | POA: Diagnosis not present

## 2017-06-15 DIAGNOSIS — I110 Hypertensive heart disease with heart failure: Secondary | ICD-10-CM | POA: Diagnosis present

## 2017-06-15 DIAGNOSIS — I6381 Other cerebral infarction due to occlusion or stenosis of small artery: Secondary | ICD-10-CM | POA: Diagnosis present

## 2017-06-15 DIAGNOSIS — F3289 Other specified depressive episodes: Secondary | ICD-10-CM | POA: Diagnosis not present

## 2017-06-15 DIAGNOSIS — E7800 Pure hypercholesterolemia, unspecified: Secondary | ICD-10-CM | POA: Diagnosis present

## 2017-06-15 DIAGNOSIS — R079 Chest pain, unspecified: Secondary | ICD-10-CM | POA: Diagnosis not present

## 2017-06-15 DIAGNOSIS — G20 Parkinson's disease: Secondary | ICD-10-CM | POA: Diagnosis not present

## 2017-06-15 DIAGNOSIS — I639 Cerebral infarction, unspecified: Secondary | ICD-10-CM | POA: Diagnosis not present

## 2017-06-15 DIAGNOSIS — L03119 Cellulitis of unspecified part of limb: Secondary | ICD-10-CM | POA: Diagnosis not present

## 2017-06-15 DIAGNOSIS — E785 Hyperlipidemia, unspecified: Secondary | ICD-10-CM | POA: Diagnosis present

## 2017-06-15 DIAGNOSIS — G4733 Obstructive sleep apnea (adult) (pediatric): Secondary | ICD-10-CM | POA: Diagnosis present

## 2017-06-15 DIAGNOSIS — R4781 Slurred speech: Secondary | ICD-10-CM | POA: Diagnosis not present

## 2017-06-15 DIAGNOSIS — R2681 Unsteadiness on feet: Secondary | ICD-10-CM | POA: Diagnosis not present

## 2017-06-15 DIAGNOSIS — M109 Gout, unspecified: Secondary | ICD-10-CM | POA: Diagnosis not present

## 2017-06-15 DIAGNOSIS — Z8547 Personal history of malignant neoplasm of testis: Secondary | ICD-10-CM | POA: Diagnosis not present

## 2017-06-15 DIAGNOSIS — R0789 Other chest pain: Secondary | ICD-10-CM | POA: Diagnosis not present

## 2017-06-15 DIAGNOSIS — R5383 Other fatigue: Secondary | ICD-10-CM | POA: Diagnosis not present

## 2017-06-15 DIAGNOSIS — E271 Primary adrenocortical insufficiency: Secondary | ICD-10-CM | POA: Diagnosis not present

## 2017-06-15 DIAGNOSIS — I672 Cerebral atherosclerosis: Secondary | ICD-10-CM | POA: Diagnosis present

## 2017-06-15 DIAGNOSIS — M545 Low back pain: Secondary | ICD-10-CM | POA: Diagnosis present

## 2017-06-15 DIAGNOSIS — E039 Hypothyroidism, unspecified: Secondary | ICD-10-CM | POA: Diagnosis present

## 2017-06-15 DIAGNOSIS — I2510 Atherosclerotic heart disease of native coronary artery without angina pectoris: Secondary | ICD-10-CM | POA: Diagnosis present

## 2017-06-15 DIAGNOSIS — M1A9XX1 Chronic gout, unspecified, with tophus (tophi): Secondary | ICD-10-CM | POA: Diagnosis not present

## 2017-06-15 DIAGNOSIS — I5030 Unspecified diastolic (congestive) heart failure: Secondary | ICD-10-CM | POA: Diagnosis not present

## 2017-06-15 DIAGNOSIS — I252 Old myocardial infarction: Secondary | ICD-10-CM | POA: Diagnosis not present

## 2017-06-15 DIAGNOSIS — R21 Rash and other nonspecific skin eruption: Secondary | ICD-10-CM | POA: Diagnosis not present

## 2017-06-15 DIAGNOSIS — R471 Dysarthria and anarthria: Secondary | ICD-10-CM | POA: Diagnosis present

## 2017-06-15 DIAGNOSIS — R5381 Other malaise: Secondary | ICD-10-CM | POA: Diagnosis not present

## 2017-06-15 DIAGNOSIS — S8990XA Unspecified injury of unspecified lower leg, initial encounter: Secondary | ICD-10-CM | POA: Diagnosis not present

## 2017-06-15 DIAGNOSIS — R2689 Other abnormalities of gait and mobility: Secondary | ICD-10-CM | POA: Diagnosis not present

## 2017-06-15 DIAGNOSIS — Z96652 Presence of left artificial knee joint: Secondary | ICD-10-CM | POA: Diagnosis present

## 2017-06-15 DIAGNOSIS — M6281 Muscle weakness (generalized): Secondary | ICD-10-CM | POA: Diagnosis not present

## 2017-06-15 DIAGNOSIS — K828 Other specified diseases of gallbladder: Secondary | ICD-10-CM | POA: Diagnosis not present

## 2017-06-15 DIAGNOSIS — I10 Essential (primary) hypertension: Secondary | ICD-10-CM | POA: Diagnosis not present

## 2017-06-15 DIAGNOSIS — I63312 Cerebral infarction due to thrombosis of left middle cerebral artery: Secondary | ICD-10-CM | POA: Diagnosis not present

## 2017-06-15 DIAGNOSIS — R531 Weakness: Secondary | ICD-10-CM | POA: Diagnosis not present

## 2017-06-15 DIAGNOSIS — Z8551 Personal history of malignant neoplasm of bladder: Secondary | ICD-10-CM | POA: Diagnosis not present

## 2017-06-15 DIAGNOSIS — G459 Transient cerebral ischemic attack, unspecified: Secondary | ICD-10-CM | POA: Diagnosis not present

## 2017-06-15 DIAGNOSIS — I6522 Occlusion and stenosis of left carotid artery: Secondary | ICD-10-CM | POA: Diagnosis present

## 2017-06-15 DIAGNOSIS — I130 Hypertensive heart and chronic kidney disease with heart failure and stage 1 through stage 4 chronic kidney disease, or unspecified chronic kidney disease: Secondary | ICD-10-CM | POA: Diagnosis not present

## 2017-06-15 DIAGNOSIS — R278 Other lack of coordination: Secondary | ICD-10-CM | POA: Diagnosis not present

## 2017-06-15 DIAGNOSIS — M7989 Other specified soft tissue disorders: Secondary | ICD-10-CM | POA: Diagnosis not present

## 2017-06-15 DIAGNOSIS — M6259 Muscle wasting and atrophy, not elsewhere classified, multiple sites: Secondary | ICD-10-CM | POA: Diagnosis not present

## 2017-06-15 DIAGNOSIS — G8929 Other chronic pain: Secondary | ICD-10-CM | POA: Diagnosis present

## 2017-06-15 DIAGNOSIS — I5032 Chronic diastolic (congestive) heart failure: Secondary | ICD-10-CM | POA: Diagnosis not present

## 2017-06-15 DIAGNOSIS — L03115 Cellulitis of right lower limb: Secondary | ICD-10-CM | POA: Diagnosis not present

## 2017-06-15 LAB — CBC
HCT: 45.6 % (ref 39.0–52.0)
Hemoglobin: 14.2 g/dL (ref 13.0–17.0)
MCH: 26 pg (ref 26.0–34.0)
MCHC: 31.1 g/dL (ref 30.0–36.0)
MCV: 83.4 fL (ref 78.0–100.0)
Platelets: 202 10*3/uL (ref 150–400)
RBC: 5.47 MIL/uL (ref 4.22–5.81)
RDW: 13.5 % (ref 11.5–15.5)
WBC: 7.7 10*3/uL (ref 4.0–10.5)

## 2017-06-15 LAB — BASIC METABOLIC PANEL
Anion gap: 9 (ref 5–15)
BUN: 13 mg/dL (ref 6–20)
CO2: 31 mmol/L (ref 22–32)
Calcium: 9 mg/dL (ref 8.9–10.3)
Chloride: 94 mmol/L — ABNORMAL LOW (ref 101–111)
Creatinine, Ser: 1.05 mg/dL (ref 0.61–1.24)
GFR calc Af Amer: >60 mL/min (ref >60)
GFR calc non Af Amer: >60 mL/min (ref >60)
Glucose, Bld: 113 mg/dL — ABNORMAL HIGH (ref 65–99)
Potassium: 3.4 mmol/L — ABNORMAL LOW (ref 3.5–5.1)
Sodium: 134 mmol/L — ABNORMAL LOW (ref 135–145)

## 2017-06-15 MED ORDER — ADULT MULTIVITAMIN W/MINERALS CH: 1.0000 | ORAL_TABLET | Freq: Every day | ORAL | 0 refills | Status: AC

## 2017-06-15 MED ORDER — HYDROCORTISONE 10 MG PO TABS: ORAL_TABLET | ORAL | 0 refills | Status: DC

## 2017-06-15 MED ORDER — CEFAZOLIN IV (FOR PTA / DISCHARGE USE ONLY): 2.0000 g | Freq: Three times a day (TID) | INTRAVENOUS | 0 refills | Status: AC

## 2017-06-15 MED ORDER — PREDNISONE 20 MG PO TABS: 40.0000 mg | ORAL_TABLET | Freq: Every day | ORAL | 0 refills | Status: AC

## 2017-06-15 MED ORDER — COLCHICINE 0.6 MG PO TABS: 0.6000 mg | ORAL_TABLET | Freq: Two times a day (BID) | ORAL | 0 refills | Status: DC

## 2017-06-15 MED ORDER — CEFAZOLIN SODIUM-DEXTROSE 2-4 GM/100ML-% IV SOLN: 2.0000 g | Freq: Three times a day (TID) | INTRAVENOUS | 0 refills | Status: DC

## 2017-06-15 MED ORDER — SODIUM CHLORIDE 0.9% FLUSH: 10.0000 mL | INTRAVENOUS | Status: DC | PRN

## 2017-06-15 MED ORDER — HEPARIN SOD (PORK) LOCK FLUSH 100 UNIT/ML IV SOLN
250.0000 [IU] | INTRAVENOUS | Status: AC | PRN
  Administered 2017-06-15: 250 [IU]

## 2017-06-15 MED ORDER — ENSURE ENLIVE PO LIQD: 237.0000 mL | Freq: Three times a day (TID) | ORAL | 12 refills | Status: DC

## 2017-06-15 NOTE — Progress Notes (Deleted)
Spoke with Vinnie Level, RN regarding PICC.  Per Vinnie Level, RN patient is to discharge to SNF with PIV.  Carolee Rota, RN VAST

## 2017-06-15 NOTE — Patient Outreach (Signed)
[facility]) Care Management  06/15/2017  Dennis Dancy Hendel Sr. 26-Feb-1946 [id number]   Following patient for transition of care related to recent hospital discharge on 11/29 for cellulitis.PMHx: includes bu not limited to , Parkinson's , obstructive sleep apena, Addison Disease, CVA.   Patient readmitted on 12/3 for cellulitis right leg, will continue to follow progress/plans per communication with hospital liaisons.    Joylene Draft, RN, [place] Management Coordinator  [phone]- Mobile [phone]- Toll Free Main Office

## 2017-06-15 NOTE — Care Management Important Message (Signed)
Important Message  Patient Details  Name: Dennis Brodhead Chill Sr. MRN: [id number] Date of Birth: 06/11/1946   Medicare Important Message Given:     IM given to patient at bedside. IM explained to wife over speaker phone in patients room.   Marilu Favre, RN 06/15/2017, 2:03 PM

## 2017-06-15 NOTE — Clinical Social Work Placement (Signed)
   CLINICAL SOCIAL WORK PLACEMENT  NOTE [facility] [place]   Date:  06/15/2017  Patient Details  Name: Dennis Yankowski Carvey Sr. MRN: [id number] Date of Birth: Dec 05, 1945  Clinical Social Work is seeking post-discharge placement for this patient at the [place] level of care (*CSW will initial, date and re-position this form in  chart as items are completed):  Yes   Patient/family provided with [place] Work Department's list of facilities offering this level of care within the geographic area requested by the patient (or if unable, by the patient's family).  Yes   Patient/family informed of their freedom to choose among providers that offer the needed level of care, that participate in Medicare, Medicaid or managed care program needed by the patient, have an available bed and are willing to accept the patient.  Yes   Patient/family informed of [facility]'s ownership interest in [facility] and [facility], as well as of the fact that they are under no obligation to receive care at these facilities.  PASRR submitted to EDS on       PASRR number received on 06/14/17     Existing PASRR number confirmed on       FL2 transmitted to all facilities in geographic area requested by pt/family on 06/14/17     FL2 transmitted to all facilities within larger geographic area on       Patient informed that his/her managed care company has contracts with or will negotiate with certain facilities, including the following:        Yes   Patient/family informed of bed offers received.  Patient chooses bed at [facility] [place]     Physician recommends and patient chooses bed at      Patient to be transferred to [facility], [place] on 06/15/17.  Patient to be transferred to facility by [facility]     Patient family notified on 06/15/17 of transfer.  Name of family member notified:  Torsten Weniger, wife     PHYSICIAN Please prepare priority discharge  summary, including medications     Additional Comment:    _______________________________________________ Alexander Mt, LCSWA 06/15/2017, 3:43 PM

## 2017-06-15 NOTE — Progress Notes (Signed)
Nutrition Follow-up  DOCUMENTATION CODES:   Morbid obesity  INTERVENTION:   -Continue Ensure Enlive po TID, each supplement provides 350 kcal and 20 grams of protein -Continue MVI daily  NUTRITION DIAGNOSIS:   Inadequate oral intake related to decreased appetite, early satiety(taste changes) as evidenced by meal completion < 50%, per patient/family report.  Ongoing  GOAL:   Patient will meet greater than or equal to 90% of their needs  Progressing  MONITOR:   PO intake, Supplement acceptance, Diet advancement, Labs, Weight trends, Skin, I & O's  REASON FOR ASSESSMENT:   Malnutrition Screening Tool    ASSESSMENT:   Dennis Zurcher Tates Sr. is a 71 y.o. male presenting with RLE cellulitis. PMH is significant for known history of previous CVA, Addison's disease, history of testicular cancer, history of prostate cancer, gout, hypertension, hyperlipidemia, hypothyroidism, OSA, Parkinson's disease.  Pt sleeping soundly at time of visit. RD did not disturb.   Meal completion has improved. But variable. Noted po 10-100%. Pt is also taking Ensure supplements as ordered.   Per RNCM note, pt will discharge to SNF vs home with IV antibiotics.   Labs reviewed: Na: 134, K: 3.4.   Diet Order:  Diet Heart Room service appropriate? Yes; Fluid consistency: Thin  EDUCATION NEEDS:   Education needs have been addressed  Skin:  Skin Assessment: Skin Integrity Issues: Skin Integrity Issues:: Other (Comment) Other: rt lower leg cellulitis  Last BM:  06/12/17  Height:   Ht Readings from Last 1 Encounters:  06/12/17 6\' 7"  (2.007 m)    Weight:   Wt Readings from Last 1 Encounters:  06/12/17 (!) 365 lb (165.6 kg)    Ideal Body Weight:  100 kg  BMI:  Body mass index is 41.12 kg/m.  Estimated Nutritional Needs:   Kcal:  1850-2050  Protein:  125-150 grams  Fluid:  1.8-2.0 L    Daisha Filosa A. Jimmye Norman, RD, LDN, CDE Pager: [phone] After hours Pager: [phone]

## 2017-06-15 NOTE — Progress Notes (Signed)
Spoke with Vinnie Level, RN regarding PICC placement.  Per Vinnie Level, RN patient is to be discharged to SNF with PIV.  Carolee Rota, RN VAST

## 2017-06-15 NOTE — Social Work (Signed)
Clinical Social Worker facilitated patient discharge including contacting patient family and facility to confirm patient discharge plans.  Clinical information faxed to facility and family agreeable with plan.  CSW arranged ambulance transport via [facility] to [facility]. RN to call [phone] with report prior to discharge.  Clinical Social Worker will sign off for now as social work intervention is no longer needed. Please consult [name] again if new need arises.  Alexander Mt, LCSWA Clinical Social Worker [phone]

## 2017-06-15 NOTE — Social Work (Addendum)
12:00pm- CSW met with pt at bedside to discuss SNF options. CSW discussed inability of some facilities of family choice to offer placement due to weight requirements for the beds. Pt stated that he really did not want to go to anywhere but [facility] or [facility].  Pt and wife had expressed preference for [facility] and [facility]. Pt wife stated that she didn't want him to go to other facilities.   12:35pm: CSW contacted pt wife and left voicemail that we were not able to offer placement option at [facility] or [facility], but there were other bed offers.   12:42pm: Pt wife returned call and stated that "she knew people at [facility] and she was going to stay until she could talk to facility owner." CSW informed pt wife that she could let CSW know the outcome of the meeting, but that [facility] liaison had informed CSW that they were not able to provide bariatric bed. Pt wife stated that she had Medicare rights and would not accept placement at another facility if there was no offer at [facility] or [facility].   2:34pm- [place] again made aware of pt and family desire to go to [facility] only, and after resending through the hub liasion stated she can offer placement. CSW informed [facility] SNF liason that pt needs iv ancef, they are aware and asked picc line to be placed.   Family and pt aware. CSW will continue to support discharge planning support.   Alexander Mt, [place] Work [phone]

## 2017-06-15 NOTE — Progress Notes (Signed)
Occupational Therapy Treatment Patient Details Name: Dennis Weinand Daquila Sr. MRN: [id number] DOB: [birth year] Today's Date: 06/15/2017    History of present illness Dennis Tomasetti Gaertner Sr. is a 71 y.o. male presenting with RLE cellulitis. PMH is significant for known history of previous CVA, Addison's disease, history of testicular cancer, history of prostate cancer, gout, hypertension, hyperlipidemia, hypothyroidism, OSA, Parkinson's disease.   OT comments  Pt making good progress with functional goals. OT will continue to follow acutely  Follow Up Recommendations  SNF    Equipment Recommendations  None recommended by OT;Other (comment)(TBD at next venue of care)    Recommendations for Other Services      Precautions / Restrictions Precautions Precautions: Fall Restrictions Weight Bearing Restrictions: No       Mobility Bed Mobility               General bed mobility comments: pt sitting EOB with  nurse tech present upon OT arrival  Transfers Overall transfer level: Needs assistance Equipment used: Rolling walker (2 wheeled) Transfers: Sit to/from Stand Sit to Stand: Min guard         General transfer comment: Min guard for safety. Pain reported in RLE.     Balance Overall balance assessment: Needs assistance Sitting-balance support: No upper extremity supported;Feet supported Sitting balance-Leahy Scale: Good     Standing balance support: Bilateral upper extremity supported Standing balance-Leahy Scale: Poor                             ADL either performed or assessed with clinical judgement   ADL Overall ADL's : Needs assistance/impaired     Grooming: Wash/dry hands;Standing;Min guard;Wash/dry face       Lower Body Bathing: Sit to/from stand;Moderate assistance Lower Body Bathing Details (indicate cue type and reason): simulated     Lower Body Dressing: Sit to/from stand;Moderate assistance Lower Body Dressing Details (indicate cue type  and reason): simulated, max A for socks Toilet Transfer: Min guard;Ambulation;RW;Cueing for safety   Toileting- Clothing Manipulation and Hygiene: Min guard;Sit to/from stand   Tub/ Banker: 3 in 1;Ambulation;Rolling walker;Min guard;Cueing for safety   Functional mobility during ADLs: Min guard;Rolling walker;Cueing for safety       Vision Baseline Vision/History: Wears glasses Patient Visual Report: No change from baseline     Perception     Praxis      Cognition Arousal/Alertness: Awake/alert Behavior During Therapy: WFL for tasks assessed/performed Overall Cognitive Status: Within Functional Limits for tasks assessed                                          Exercises     Shoulder Instructions       General Comments  pt very pleasant and cooperative, jovial    Pertinent Vitals/ Pain       Pain Assessment: 0-10 Pain Score: 5  Pain Location: R foot with mobility/weight bearing Pain Descriptors / Indicators: Grimacing;Guarding Pain Intervention(s): Monitored during session;Repositioned;Relaxation  Home Living                                          Prior Functioning/Environment              Frequency  Min 2X/week  Progress Toward Goals  OT Goals(current goals can now be found in the care plan section)  Progress towards OT goals: Progressing toward goals  Acute Rehab OT Goals Patient Stated Goal: go to SNF rehab then home  Plan Discharge plan remains appropriate    Co-evaluation                 AM-PAC PT "6 Clicks" Daily Activity     Outcome Measure                    End of Session Equipment Utilized During Treatment: Rolling walker  OT Visit Diagnosis: Unsteadiness on feet (R26.81);Pain;Other abnormalities of gait and mobility (R26.89) Pain - Right/Left: Right Pain - part of body: Leg;Ankle and joints of foot   Activity Tolerance Patient tolerated treatment well    Patient Left with call bell/phone within reach;Other (comment)   Nurse Communication      Functional Assessment Tool Used: AM-PAC 6 Clicks Daily Activity   Time: 9021-1155 OT Time Calculation (min): 44 min  Charges: OT G-codes **NOT FOR INPATIENT CLASS** Functional Assessment Tool Used: AM-PAC 6 Clicks Daily Activity OT General Charges $OT Visit: 1 Visit OT Treatments $Self Care/Home Management : 23-37 mins $Therapeutic Activity: 8-22 mins     Britt Bottom 06/15/2017, 2:28 PM

## 2017-06-15 NOTE — Clinical Social Work Note (Signed)
Clinical Social Work Assessment  Patient Details  Name: Dennis Mase Slape Sr. MRN: [id number] Date of Birth: [birth year]  Date of referral:  06/14/17               Reason for consult:  Discharge Planning, Facility Placement                Permission sought to share information with:  Facility Sport and exercise psychologist, Family Supports Permission granted to share information::  Yes, Verbal Permission Granted  Name::     Dennis [name]  Agency::  SNFs  Relationship::  wife   Contact Information:  [phone]  Housing/Transportation Living arrangements for the past 2 months:  Single Family Home(Farm) Source of Information:  Patient, Spouse Patient Interpreter Needed:  None Criminal Activity/Legal Involvement Pertinent to Current Situation/Hospitalization:  No - Comment as needed Significant Relationships:  Adult Children, Spouse, Community Support, Other Family Members Lives with:  Spouse Do you feel safe going back to the place where you live?  Yes Need for family participation in patient care:  Yes (Comment)  Care giving concerns:  Pt wife states that she is concerned with his multiple health conditions and his size that she would not be able to appropriately support caregiving needs.    Social Worker assessment / plan:  CSW met with pt at bedside, wife was on speaker phone for part of the conversation. Pt wife expressed displeasure with their experience at [facility] wife states that she has a friend who is a nurse who showed her a "sanction list," and that she won't let pt "go just anywhere," but she does think that pt would benefit from short term rehab. CSW explained that CSW would send insurance information to all facilities in the area and that [facility] would give bed offers to pt and pt wife when received. Pt wife stated that she had a strong preference for [facility] and [facility]. Pt and pt wife have family members at [facility] stated that she would contact those  facility representatives to let them know of family interest. CSW will support discharge planning needs when medically appropriate.   Employment status:  Retired Forensic scientist:  Medicare PT Recommendations:  [place] / Referral to community resources:  [place]  Patient/Family's Response to care:  Pt and pt wife are amenable to SNF placement to improve strength.  Patient/Family's Understanding of and Emotional Response to Diagnosis, Current Treatment, and Prognosis:  Pt and family are understanding of diagnosis, current treatment, yet state apprehension about care in future, specifically around which facility pt will receive care in. CSW used active listening and supported family as they discussed hesitancies around future care.    Emotional Assessment Appearance:  Appears stated age Attitude/Demeanor/Rapport:  (Chatty; Cooperative; Pleasant) Affect (typically observed):  Accepting, Adaptable, Pleasant, Appropriate Orientation:  Oriented to Self, Oriented to Place, Oriented to  Time, Oriented to Situation Alcohol / Substance use:  Alcohol Use(social, last drink in 2016) Psych involvement (Current and /or in the community):  No (Comment)  Discharge Needs  Concerns to be addressed:  Discharge Planning Concerns Readmission within the last 30 days:  Yes Current discharge risk:  Dependent with Mobility Barriers to Discharge:  Ship broker, Continued Medical Work up   [employer], [place] 06/15/2017, 12:48 PM

## 2017-06-15 NOTE — Progress Notes (Signed)
Peripherally Inserted Central Catheter/Midline Placement  The IV Nurse has discussed with the patient and/or persons authorized to consent for the patient, the purpose of this procedure and the potential benefits and risks involved with this procedure.  The benefits include less needle sticks, lab draws from the catheter, and the patient may be discharged home with the catheter. Risks include, but not limited to, infection, bleeding, blood clot (thrombus formation), and puncture of an artery; nerve damage and irregular heartbeat and possibility to perform a PICC exchange if needed/ordered by physician.  Alternatives to this procedure were also discussed.  Bard Power PICC patient education guide, fact sheet on infection prevention and patient information card has been provided to patient /or left at bedside.    PICC/Midline Placement Documentation  PICC Single Lumen 06/15/17 PICC Right Cephalic 43 cm 0 cm (Active)  Indication for Insertion or Continuance of Line Home intravenous therapies (PICC only) 06/15/2017  7:22 PM  Exposed Catheter (cm) 0 cm 06/15/2017  7:22 PM  Site Assessment Clean;Dry;Intact 06/15/2017  7:22 PM  Line Status Flushed;Saline locked;Blood return noted 06/15/2017  7:22 PM  Dressing Type Transparent 06/15/2017  7:22 PM  Dressing Status Clean;Dry;Intact;Antimicrobial disc in place 06/15/2017  7:22 PM  Dressing Change Due 06/22/17 06/15/2017  7:22 PM       Skippy [place], Nicolette Bang 06/15/2017, 7:23 PM

## 2017-06-15 NOTE — Progress Notes (Signed)
Reported given to Collie Siad at [facility], EMS picked up patient and transported Pt to facility. Paperwork was given to EMS.

## 2017-06-15 NOTE — Progress Notes (Signed)
Family Medicine Teaching Service Daily Progress Note Intern Pager: [phone]  Patient name: Dennis Macintyre Sr. Medical record number: [id number] Date of birth: October 13, 1945 Age: 71 y.o. Gender: male  Primary Care Provider: Mar Daring, PA-C Consultants: none Code Status: full   Pt Overview and Major Events to Date:  Admitted to [facility] on 06/12/17  Assessment and Plan: Nolon Nations Angelo Mccallis a 71 y.o.malepresenting with RLE cellulitis.PMH is significant for known history of previous CVA, Addison's disease, history of testicular cancer, history of prostate cancer, gout, hypertension, hyperlipidemia, hypothyroidism,OSA, Parkinson's disease.  RLE Cellulitis Recently on antibiotic regiment for treatment. Afebrile and hemodynamically stable. LA was wnl.WBC 9.5 on 12/5.Considered DVT as cause of presentation; however preliminary dopplers negative. Could also consider gout flare as mimic of cellulitis given one significantly erythematous toe.Patient notes improvement in color, edema and decrease in pain.  -continueIV cefazolin 200 mL/hr, q8hr (d#4), can be discharged on IV abx (patient adamant that he does not want to transition to oral abx) - Tylenol q6 prn for pain; avoid additional NSAIDs  - Blood cx NGTD - Elevate legs -PT/OT to evaluate and treat -continue colchicine  - trial of prednisone 40mg  daily   Addison's Disease: - Continue home Solu-Cortef  Parkinson's Disease:Diagnosed on October 27. - Continue Sinemet.  - Followed by Dr. Brigitte Pulse  HTN Hypertensive on admission, improving.Currently normotensive at 126/73 - Continue home carvedilol, losartan.  H/o gout Usually occurs in first MTP and in MCP joints.Potential for acute flare, see above.Uric acid slightly elevated to 7.8. -discontinueallopurinol during acute flare -Continuecolchicine for possible gout flare  Hypothyroidism: - Continue home synthroid  Hypokalemia: Resolved. K of 3.7 on  06/14/17 -continue to monitor  OSA: - CPAP at night  Depression: - Continue home Zoloft  HLD: - Continue home atorvastatin; patient is only on 20 mg and would likely benefit from high intensity statin given h/o CVA  H/oof previous CVA: -Continue home aspirin 81 mg and Plavix 75 mg daily  H/o chronic diastolic CHF:  -Continue home Lasix, carvedilol, losartan -clarify Lasix dose tomorrow; patient reported 40 mg daily but EMR reports 40 mg BID  H/o prostate and testicular cancer: - On testosterone every 2 weeks - restarted testosterone while inpatient   FEN/GI: heart healthy diet PPx: lovenox   Disposition: to SNF when medically stable   Subjective:  Patient today states improvement. States foot is still slightly tender but improved. Patient is able to bear more weight to foot. Patient agreeable to SNF placement. Patient states he did not sleep well overnight, but due to constant checks rather than pain.   Objective: Temp:  [97.9 F (36.6 C)-98.2 F (36.8 C)] 98.2 F (36.8 C) (12/06 0519) Pulse Rate:  [67-72] 70 (12/06 0907) Resp:  [18] 18 (12/06 0519) BP: (113-136)/(63-78) 113/74 (12/06 0907) SpO2:  [97 %] 97 % (12/06 0519) Physical Exam: General: awake and alert, laying in bed, NAD Cardiovascular: RRR, no MRG Respiratory: CTAB, no wheezes, rales or rhonchi, difficult exam due to body habitus  Abdomen: soft, non tender, bowel sounds normal, distended  Extremities: erythema to shins bilaterally. Improvement to erythema compared to margin borders. Improvement to edema. Tenderness to palpation of right foot. 2+ pulses bilaterally. Full ankle ROM.   Laboratory: Recent Labs  Lab 06/13/17 0023 06/14/17 0607 06/15/17 0616  WBC 6.2 9.5 7.7  HGB 14.6 14.3 14.2  HCT 46.6 44.6 45.6  PLT 192 200 202   Recent Labs  Lab 06/13/17 0023 06/14/17 0607 06/15/17 0616  NA 132* 134*  134*  K 3.9 3.7 3.4*  CL 95* 95* 94*  CO2 26 29 31   BUN 11 11 13   CREATININE 1.21  1.13 1.05  CALCIUM 9.0 8.9 9.0  GLUCOSE 125* 114* 113*   Results for orders placed or performed during the hospital encounter of 06/12/17  Culture, blood (routine x 2)     Status: None (Preliminary result)   Collection Time: 06/12/17  3:10 PM  Result Value Ref Range Status   Specimen Description BLOOD RIGHT ANTECUBITAL  Final   Special Requests   Final    BOTTLES DRAWN AEROBIC AND ANAEROBIC Blood Culture adequate volume   Culture NO GROWTH 2 DAYS  Final   Report Status PENDING  Incomplete  Culture, blood (routine x 2)     Status: None (Preliminary result)   Collection Time: 06/12/17  4:32 PM  Result Value Ref Range Status   Specimen Description BLOOD  Final   Special Requests   Final    BOTTLES DRAWN AEROBIC AND ANAEROBIC Blood Culture results may not be optimal due to an excessive volume of blood received in culture bottles   Culture NO GROWTH 2 DAYS  Final   Report Status PENDING  Incomplete    Imaging/Diagnostic Tests: Dg Tibia/fibula Right  Result Date: 06/06/2017 CLINICAL DATA:  Cellulitis right lower extremity. EXAM: RIGHT TIBIA AND FIBULA - 2 VIEW COMPARISON:  None. FINDINGS: Soft tissue swelling diffusely throughout the right calf. No underlying acute bony abnormality. No fracture, subluxation or dislocation. No radiographic changes of osteomyelitis. Degenerative changes in the right knee. IMPRESSION: No acute bony abnormality. Electronically Signed   By: Rolm Baptise M.D.   On: 06/06/2017 11:30   US Venous Img Lower Unilateral Right  Result Date: 06/06/2017 CLINICAL DATA:  71 year old male with a history of right leg redness and swelling EXAM: RIGHT LOWER EXTREMITY VENOUS DOPPLER ULTRASOUND TECHNIQUE: Gray-scale sonography with graded compression, as well as color Doppler and duplex ultrasound were performed to evaluate the lower extremity deep venous systems from the level of the common femoral vein and including the common femoral, femoral, profunda femoral, popliteal and  calf veins including the posterior tibial, peroneal and gastrocnemius veins when visible. The superficial great saphenous vein was also interrogated. Spectral Doppler was utilized to evaluate flow at rest and with distal augmentation maneuvers in the common femoral, femoral and popliteal veins. COMPARISON:  None. FINDINGS: Contralateral Common Femoral Vein: Respiratory phasicity is normal and symmetric with the symptomatic side. No evidence of thrombus. Normal compressibility. Common Femoral Vein: No evidence of thrombus. Normal compressibility, respiratory phasicity and response to augmentation. Saphenofemoral Junction: No evidence of thrombus. Normal compressibility and flow on color Doppler imaging. Profunda Femoral Vein: No evidence of thrombus. Normal compressibility and flow on color Doppler imaging. Femoral Vein: No evidence of thrombus. Normal compressibility, respiratory phasicity and response to augmentation. Popliteal Vein: No evidence of thrombus. Normal compressibility, respiratory phasicity and response to augmentation. Calf Veins: No evidence of thrombus. Normal compressibility and flow on color Doppler imaging. Superficial Great Saphenous Vein: No evidence of thrombus. Normal compressibility and flow on color Doppler imaging. Other Findings:  None. IMPRESSION: Sonographic survey of the right lower extremity negative for DVT Electronically Signed   By: Corrie Mckusick D.O.   On: 06/06/2017 12:03   Dg Chest Port 1 View  Result Date: 05/27/2017 CLINICAL DATA:  Shortness of Breath EXAM: PORTABLE CHEST 1 VIEW COMPARISON:  01/13/2017 FINDINGS: Cardiac shadow is enlarged. Loop recorder is again noted and stable. The lungs are well  aerated bilaterally. No focal infiltrate or sizable effusion is seen. No bony abnormality is noted. IMPRESSION: No active disease. Electronically Signed   By: Inez Catalina M.D.   On: 05/27/2017 16:52   Dg Foot Complete Right  Result Date: 06/06/2017 CLINICAL DATA:   Cellulitis right lower extremity for 3 weeks EXAM: RIGHT FOOT COMPLETE - 3+ VIEW COMPARISON:  None. FINDINGS: Marked soft tissue swelling along the dorsum of the foot. Mild degenerative changes at the right first MTP joint. Plantar calcaneal spur. No acute bony abnormality. No radiographic changes of osteomyelitis. IMPRESSION: Marked soft tissue swelling along the dorsum of the foot. No acute bony abnormality. Electronically Signed   By: Rolm Baptise M.D.   On: 06/06/2017 11:31     Caroline More, DO 06/15/2017, 11:19 AM PGY-1, [place] Intern pager: [phone], text pages welcome

## 2017-06-15 NOTE — Discharge Instructions (Signed)
You were admitted for right lower leg cellulitis and possible gout flare. You were treated with IV antibiotics and colchicine and improved.  You have been discharged with IV antibiotics for ** days and also colchicine. We have switched you from hydrocortisone to prednisone.   Please follow up with your PCP within 1 week of discharge.   If you develop fever, your foot worsens in redness, pain, or begins to drain fluid/pus please come to the emergency room.

## 2017-06-15 NOTE — Patient Outreach (Signed)
[facility]) Care Management  06/15/2017  Dennis Sharrar Aldea Sr. Nov 30, 1945 [id number]  71 year old male referred to [facility] Management for transition of care calls.  [place] services requested for medication questions.  PMHx includes, but not limited to, Addison's disease, Parkinson's, CAD, congestive heart failure (EF= 65-70% 5/9/'18), history of gout, CVA, testicular cancer, hypothyroidism, HTN, HLD, and cellulitis.  Noted recent admission for cellulitis of RLE 11/27 -06/08/17, discharged on cephalexin, and readmitted 12/3 with worsening swelling and pain. Noted current planning for SNF at discharge.   Plan: I will follow-up with [facility] inpatient liaison regarding discharge planing for patient.   Ralene Bathe, PharmD, [place] [phone]

## 2017-06-15 NOTE — Progress Notes (Signed)
PHARMACY CONSULT NOTE FOR:  OUTPATIENT  PARENTERAL ANTIBIOTIC THERAPY (OPAT)  Indication: Cellulitis Regimen: Cefazolin 2g IV every 8 hours End date: 06/21/17  IV antibiotic discharge orders were updated per Telephone order with Dr. Vanetta Shawl, DO as discharge orders had already been completed.     Thank you for allowing pharmacy to be a part of this patient's care.  Sloan Leiter, PharmD, BCPS, BCCCP Clinical Pharmacist Clinical phone 06/15/2017 until 3:30PM [phone] After hours, please call [phone] 06/15/2017, 4:54 PM

## 2017-06-15 NOTE — Care Management Note (Addendum)
Case Management Note  Patient Details  Name: Dennis Hoffmann Cina Sr. MRN: [id number] Date of Birth: [birth year]  Subjective/Objective:                    Action/Plan: [facility] has accepted patient.  Spoke to patient at bedside and wife was on speaker phone. Provided a copy of Medicare Important Message ( right To Appeal Discharge). Both voiced understanding.   Wife upset with Snf bed offers. Wife and patient want [facility] and are aware [facility] did not make a offer due to patient's weight. Wife's aunt and uncle are at [facility] waiting on the owner to return from lunch to see if he will offer a bed. Wife did say she is considering taking him home with home health for IV ABX. Patient and wife voiced understanding that they would be taught how to administer IV medication and they would not have a nurse every 8 hours.   Choice offered they have had [facility] in past and would like them again.   Paged Family Medicine made them aware , will need HHRN order and face to face. Spoke to Carolynn Sayers with [facility] for possible referral. [facility] will need prescription with stop date. [facility] could enter OPAT order for MD to sign . Spoke to [employer] in pharmacy , he called family medicine , they want to wait on final answer from [facility].    Expected Discharge Date:  06/15/17               Expected Discharge Plan:     In-House Referral:     Discharge planning Services  CM Consult  Post Acute Care Choice:    Choice offered to:  Patient, Spouse  DME Arranged:    DME Agency:     HH Arranged:    [facility] Agency:     Status of Service:  In process, will continue to follow  If discussed at Long Length of Stay Meetings, dates discussed:    Additional Comments:  Marilu Favre, RN 06/15/2017, 2:52 PM

## 2017-06-16 ENCOUNTER — Ambulatory Visit: Payer: Self-pay | Admitting: *Deleted

## 2017-06-17 DIAGNOSIS — M109 Gout, unspecified: Secondary | ICD-10-CM | POA: Diagnosis not present

## 2017-06-17 DIAGNOSIS — F3289 Other specified depressive episodes: Secondary | ICD-10-CM | POA: Diagnosis not present

## 2017-06-17 DIAGNOSIS — R531 Weakness: Secondary | ICD-10-CM | POA: Diagnosis not present

## 2017-06-17 DIAGNOSIS — G20 Parkinson's disease: Secondary | ICD-10-CM | POA: Diagnosis not present

## 2017-06-17 DIAGNOSIS — I2510 Atherosclerotic heart disease of native coronary artery without angina pectoris: Secondary | ICD-10-CM | POA: Diagnosis not present

## 2017-06-17 DIAGNOSIS — J449 Chronic obstructive pulmonary disease, unspecified: Secondary | ICD-10-CM | POA: Diagnosis not present

## 2017-06-17 DIAGNOSIS — E271 Primary adrenocortical insufficiency: Secondary | ICD-10-CM | POA: Diagnosis not present

## 2017-06-17 DIAGNOSIS — R21 Rash and other nonspecific skin eruption: Secondary | ICD-10-CM | POA: Diagnosis not present

## 2017-06-17 DIAGNOSIS — I10 Essential (primary) hypertension: Secondary | ICD-10-CM | POA: Diagnosis not present

## 2017-06-17 DIAGNOSIS — R2689 Other abnormalities of gait and mobility: Secondary | ICD-10-CM | POA: Diagnosis not present

## 2017-06-17 DIAGNOSIS — G4733 Obstructive sleep apnea (adult) (pediatric): Secondary | ICD-10-CM | POA: Diagnosis not present

## 2017-06-17 LAB — CULTURE, BLOOD (ROUTINE X 2)
Culture: NO GROWTH
Culture: NO GROWTH
Special Requests: ADEQUATE

## 2017-06-19 ENCOUNTER — Other Ambulatory Visit: Payer: Self-pay

## 2017-06-20 ENCOUNTER — Ambulatory Visit: Payer: Medicare Other | Admitting: Physical Therapy

## 2017-06-20 ENCOUNTER — Ambulatory Visit (INDEPENDENT_AMBULATORY_CARE_PROVIDER_SITE_OTHER): Payer: Medicare Other | Admitting: *Deleted

## 2017-06-20 DIAGNOSIS — I639 Cerebral infarction, unspecified: Secondary | ICD-10-CM

## 2017-06-21 NOTE — Progress Notes (Signed)
Carelink Summary Report / Loop Recorder 

## 2017-06-22 ENCOUNTER — Other Ambulatory Visit: Payer: Self-pay | Admitting: *Deleted

## 2017-06-22 NOTE — Patient Outreach (Signed)
[facility]) Care Management  [facility] Social Work  06/22/2017  Dennis Tapanes Ham Sr. 1945-11-26 [id number]  Subjective:  CSW met with patient , his wife and son in his room at [facility].   Objective: [facility] CSW to assist patient and family with community based resources to aide in their well-being, quality of life and overall safety and needs.    Encounter Medications:  Outpatient Encounter Medications as of 06/22/2017  Medication Sig Note  . albuterol (PROVENTIL HFA;VENTOLIN HFA) 108 (90 Base) MCG/ACT inhaler Inhale 2 puffs into the lungs every 6 (six) hours as needed for wheezing or shortness of breath.   Marland Kitchen aspirin EC 81 MG tablet Take 81 mg by mouth daily.   Marland Kitchen atorvastatin (LIPITOR) 20 MG tablet TAKE 1 TABLET(20 MG) BY MOUTH DAILY   . carbidopa-levodopa (SINEMET IR) 25-250 MG tablet Take 2 tablets by mouth 4 (four) times daily.    . carvedilol (COREG) 3.125 MG tablet Take 3.125 mg by mouth 2 (two) times daily with a meal.   . ciprofloxacin (CIPRO) 500 MG tablet Take 500 mg by mouth 2 (two) times daily as needed. 06/06/2017: Pt uses as needed for chronic kidney infections  . clopidogrel (PLAVIX) 75 MG tablet Take 75 mg by mouth daily.   . colchicine 0.6 MG tablet Take 1 tablet (0.6 mg total) by mouth 2 (two) times daily.   Marland Kitchen EPINEPHrine 0.3 mg/0.3 mL IJ SOAJ injection Inject 0.3 mLs (0.3 mg total) into the muscle as needed (allergic reaction). 06/09/2017: Has on hand   . feeding supplement, ENSURE ENLIVE, (ENSURE ENLIVE) LIQD Take 237 mLs by mouth 3 (three) times daily between meals.   . furosemide (LASIX) 20 MG tablet Take 2 tablets (40 mg total) by mouth daily. 06/09/2017: Reports taking 40 mg twice daily  . guaiFENesin-dextromethorphan (ROBITUSSIN DM) 100-10 MG/5ML syrup Take 5 mLs by mouth every 4 (four) hours as needed for cough.   . hydrocortisone (CORTEF) 10 MG tablet 2 tablets in the morning and 1 tablet at 5 PM. Please start taking on 06/18/17   . levothyroxine  (SYNTHROID, LEVOTHROID) 112 MCG tablet TAKE 2 TABLETS(224 MCG) BY MOUTH DAILY BEFORE BREAKFAST (Patient taking differently: TAKE 2 TABLETS BY MOUTH EVERY MORNING AND 1 TABLET BY MOUTH EVERY EVENING)   . loratadine (CLARITIN) 10 MG tablet Take 1 tablet (10 mg total) by mouth daily. 06/09/2017: Taking as needed  . losartan (COZAAR) 100 MG tablet TAKE 1 TABLET(100 MG) BY MOUTH DAILY   . mometasone-formoterol (DULERA) 200-5 MCG/ACT AERO Inhale 2 puffs into the lungs 2 (two) times daily. (Patient not taking: Reported on 06/12/2017)   . Multiple Vitamin (MULTIVITAMIN WITH MINERALS) TABS tablet Take 1 tablet by mouth daily.   Marland Kitchen oxyCODONE-acetaminophen (PERCOCET) 5-325 MG tablet Take 2 tablets by mouth every 4 (four) hours as needed. (Patient not taking: Reported on 06/12/2017)   . polyethylene glycol (MIRALAX / GLYCOLAX) packet Take 17 g by mouth daily as needed for mild constipation.    . sertraline (ZOLOFT) 100 MG tablet Take 100 mg by mouth daily.   . sildenafil (REVATIO) 20 MG tablet Take 20 mg by mouth.   . testosterone cypionate (DEPO-TESTOSTERONE) 200 MG/ML injection Inject 0.75 mLs (150 mg total) into the muscle every 14 (fourteen) days.    No facility-administered encounter medications on file as of 06/22/2017.     Functional Status:  In your present state of health, do you have any difficulty performing the following activities: 06/12/2017 06/06/2017  Hearing? N N  Vision? N N  Difficulty concentrating or making decisions? N N  Walking or climbing stairs? Y Y  Comment - -  Dressing or bathing? Y N  Doing errands, shopping? Y Y  Preparing Food and eating ? - -  Using the Toilet? - -  In the past six months, have you accidently leaked urine? - -  Comment - -  Do you have problems with loss of bowel control? - -  Managing your Medications? - -  Managing your Finances? - -  Housekeeping or managing your Housekeeping? - -  Some recent data might be hidden    Fall/Depression Screening:   PHQ 2/9 Scores 10/05/2016  PHQ - 2 Score 0    Assessment:  CSW introduced self, explained role and as well as shared information on services offered through [facility]  CSW confirmed patient's identity and received permission to speak with him in front of family.  CSW spent time listening to patient and wife share all that has been going on medically for him in the recent past months.  They are optimistic and very pleasant and open with CSW as assessment is completed.  CSW advised and offered to assist with d/c needs- wife shared that they are working with the [facility] to get DME needs delivered to the home.  No d/c date has been identified however SNF rep indicates he is progressing with therapy and may be ending his IV ABX today.   CSW plans a f/u call or visit to SNF next week for further assistance and planning.      Plan:  [facility] CM Care Plan Problem One     Most Recent Value  Care Plan Problem One  Admitted to SNF for rehab.  Role Documenting the Problem One  Clinical Social Worker  Care Plan for Problem One  Active  [facility] CM Short Term Goal #1   Patient will participate with therapy and goals at SNF in the next 30 days.  [facility] CM Short Term Goal #1 Start Date  06/22/17  Interventions for Short Term Goal #1  CSW discussed and encouraged patient regarding rehab goals.  [facility] CM Short Term Goal #2   Patient will be released from SNF to home with adequate support and services arranged in the next 30 days.   [facility] CM Short Term Goal #2 Start Date  06/22/17  Interventions for Short Term Goal #2  CSW will discuss and assist with coordination of dc plans and needs via patient and family and SNF staff.        Eduard Clos, MSW, [facility]  [place] [phone]

## 2017-06-26 ENCOUNTER — Emergency Department (HOSPITAL_COMMUNITY): Payer: Medicare Other

## 2017-06-26 ENCOUNTER — Encounter (HOSPITAL_COMMUNITY): Payer: Self-pay

## 2017-06-26 ENCOUNTER — Observation Stay (HOSPITAL_COMMUNITY): Payer: Medicare Other

## 2017-06-26 ENCOUNTER — Inpatient Hospital Stay (HOSPITAL_COMMUNITY)
Admission: EM | Admit: 2017-06-26 | Discharge: 2017-06-28 | DRG: 065 | Disposition: A | Discharge status: Home Health | Payer: Medicare Other | Attending: Family Medicine | Admitting: Family Medicine

## 2017-06-26 ENCOUNTER — Other Ambulatory Visit: Payer: Self-pay

## 2017-06-26 DIAGNOSIS — I252 Old myocardial infarction: Secondary | ICD-10-CM

## 2017-06-26 DIAGNOSIS — I63312 Cerebral infarction due to thrombosis of left middle cerebral artery: Secondary | ICD-10-CM

## 2017-06-26 DIAGNOSIS — I6522 Occlusion and stenosis of left carotid artery: Secondary | ICD-10-CM | POA: Diagnosis present

## 2017-06-26 DIAGNOSIS — E7800 Pure hypercholesterolemia, unspecified: Secondary | ICD-10-CM | POA: Diagnosis present

## 2017-06-26 DIAGNOSIS — G20A1 Parkinson's disease without dyskinesia, without mention of fluctuations: Secondary | ICD-10-CM | POA: Diagnosis present

## 2017-06-26 DIAGNOSIS — Z6841 Body Mass Index (BMI) 40.0 and over, adult: Secondary | ICD-10-CM | POA: Diagnosis not present

## 2017-06-26 DIAGNOSIS — F329 Major depressive disorder, single episode, unspecified: Secondary | ICD-10-CM | POA: Diagnosis present

## 2017-06-26 DIAGNOSIS — M549 Dorsalgia, unspecified: Secondary | ICD-10-CM

## 2017-06-26 DIAGNOSIS — Z8551 Personal history of malignant neoplasm of bladder: Secondary | ICD-10-CM

## 2017-06-26 DIAGNOSIS — R0789 Other chest pain: Secondary | ICD-10-CM | POA: Diagnosis not present

## 2017-06-26 DIAGNOSIS — Z96652 Presence of left artificial knee joint: Secondary | ICD-10-CM | POA: Diagnosis present

## 2017-06-26 DIAGNOSIS — Z91030 Bee allergy status: Secondary | ICD-10-CM

## 2017-06-26 DIAGNOSIS — I672 Cerebral atherosclerosis: Secondary | ICD-10-CM | POA: Diagnosis present

## 2017-06-26 DIAGNOSIS — R29702 NIHSS score 2: Secondary | ICD-10-CM | POA: Diagnosis present

## 2017-06-26 DIAGNOSIS — R471 Dysarthria and anarthria: Secondary | ICD-10-CM | POA: Diagnosis present

## 2017-06-26 DIAGNOSIS — I6381 Other cerebral infarction due to occlusion or stenosis of small artery: Secondary | ICD-10-CM | POA: Diagnosis present

## 2017-06-26 DIAGNOSIS — I639 Cerebral infarction, unspecified: Secondary | ICD-10-CM | POA: Diagnosis not present

## 2017-06-26 DIAGNOSIS — G459 Transient cerebral ischemic attack, unspecified: Secondary | ICD-10-CM

## 2017-06-26 DIAGNOSIS — K828 Other specified diseases of gallbladder: Secondary | ICD-10-CM | POA: Diagnosis not present

## 2017-06-26 DIAGNOSIS — M1A9XX1 Chronic gout, unspecified, with tophus (tophi): Secondary | ICD-10-CM | POA: Diagnosis present

## 2017-06-26 DIAGNOSIS — E039 Hypothyroidism, unspecified: Secondary | ICD-10-CM | POA: Diagnosis present

## 2017-06-26 DIAGNOSIS — I5032 Chronic diastolic (congestive) heart failure: Secondary | ICD-10-CM | POA: Diagnosis present

## 2017-06-26 DIAGNOSIS — I110 Hypertensive heart disease with heart failure: Secondary | ICD-10-CM | POA: Diagnosis present

## 2017-06-26 DIAGNOSIS — E785 Hyperlipidemia, unspecified: Secondary | ICD-10-CM | POA: Diagnosis present

## 2017-06-26 DIAGNOSIS — Z8547 Personal history of malignant neoplasm of testis: Secondary | ICD-10-CM

## 2017-06-26 DIAGNOSIS — E271 Primary adrenocortical insufficiency: Secondary | ICD-10-CM | POA: Diagnosis not present

## 2017-06-26 DIAGNOSIS — Z79899 Other long term (current) drug therapy: Secondary | ICD-10-CM

## 2017-06-26 DIAGNOSIS — Z7982 Long term (current) use of aspirin: Secondary | ICD-10-CM

## 2017-06-26 DIAGNOSIS — R4781 Slurred speech: Secondary | ICD-10-CM | POA: Diagnosis not present

## 2017-06-26 DIAGNOSIS — R079 Chest pain, unspecified: Secondary | ICD-10-CM | POA: Diagnosis not present

## 2017-06-26 DIAGNOSIS — Z888 Allergy status to other drugs, medicaments and biological substances status: Secondary | ICD-10-CM

## 2017-06-26 DIAGNOSIS — G4733 Obstructive sleep apnea (adult) (pediatric): Secondary | ICD-10-CM | POA: Diagnosis present

## 2017-06-26 DIAGNOSIS — Z981 Arthrodesis status: Secondary | ICD-10-CM

## 2017-06-26 DIAGNOSIS — Z91013 Allergy to seafood: Secondary | ICD-10-CM

## 2017-06-26 DIAGNOSIS — E876 Hypokalemia: Secondary | ICD-10-CM | POA: Diagnosis present

## 2017-06-26 DIAGNOSIS — I2510 Atherosclerotic heart disease of native coronary artery without angina pectoris: Secondary | ICD-10-CM | POA: Diagnosis present

## 2017-06-26 DIAGNOSIS — Z7902 Long term (current) use of antithrombotics/antiplatelets: Secondary | ICD-10-CM

## 2017-06-26 DIAGNOSIS — I6340 Cerebral infarction due to embolism of unspecified cerebral artery: Secondary | ICD-10-CM

## 2017-06-26 DIAGNOSIS — G20 Parkinson's disease: Secondary | ICD-10-CM | POA: Diagnosis present

## 2017-06-26 DIAGNOSIS — Z823 Family history of stroke: Secondary | ICD-10-CM

## 2017-06-26 DIAGNOSIS — G8929 Other chronic pain: Secondary | ICD-10-CM | POA: Diagnosis present

## 2017-06-26 DIAGNOSIS — H532 Diplopia: Secondary | ICD-10-CM | POA: Diagnosis present

## 2017-06-26 DIAGNOSIS — M545 Low back pain: Secondary | ICD-10-CM | POA: Diagnosis present

## 2017-06-26 LAB — BASIC METABOLIC PANEL
Anion gap: 10 (ref 5–15)
BUN: 16 mg/dL (ref 6–20)
CO2: 28 mmol/L (ref 22–32)
Calcium: 9.1 mg/dL (ref 8.9–10.3)
Chloride: 99 mmol/L — ABNORMAL LOW (ref 101–111)
Creatinine, Ser: 1.21 mg/dL (ref 0.61–1.24)
GFR calc Af Amer: >60 mL/min (ref >60)
GFR calc non Af Amer: 58 mL/min — ABNORMAL LOW (ref >60)
Glucose, Bld: 101 mg/dL — ABNORMAL HIGH (ref 65–99)
Potassium: 3.8 mmol/L (ref 3.5–5.1)
Sodium: 137 mmol/L (ref 135–145)

## 2017-06-26 LAB — CBC
HCT: 46.5 % (ref 39.0–52.0)
Hemoglobin: 14.6 g/dL (ref 13.0–17.0)
MCH: 26.5 pg (ref 26.0–34.0)
MCHC: 31.4 g/dL (ref 30.0–36.0)
MCV: 84.5 fL (ref 78.0–100.0)
Platelets: 159 10*3/uL (ref 150–400)
RBC: 5.5 MIL/uL (ref 4.22–5.81)
RDW: 14.4 % (ref 11.5–15.5)
WBC: 8.4 10*3/uL (ref 4.0–10.5)

## 2017-06-26 LAB — I-STAT TROPONIN, ED
Troponin i, poc: 0 ng/mL (ref 0.00–0.08)
Troponin i, poc: 0 ng/mL (ref 0.00–0.08)

## 2017-06-26 MED ORDER — ONDANSETRON HCL 4 MG/2ML IJ SOLN: 4.0000 mg | Freq: Once | INTRAMUSCULAR | Status: DC

## 2017-06-26 MED ORDER — MORPHINE SULFATE (PF) 4 MG/ML IV SOLN: 2.0000 mg | Freq: Once | INTRAVENOUS | Status: DC

## 2017-06-26 MED ORDER — IOPAMIDOL (ISOVUE-370) INJECTION 76%
INTRAVENOUS | Status: AC
  Administered 2017-06-26: 100 mL via INTRAVENOUS
  Filled 2017-06-26: qty 100
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
## 2017-06-26 NOTE — ED Triage Notes (Addendum)
Pt from [place], pt had chest pressure and a feeling of being off with mild slurred speech that lasted about 30 mins. Pt is feeling better now and feels back to baseline except the chest pressure. Pt was given one nitro and 324mg  ASA

## 2017-06-26 NOTE — ED Notes (Signed)
ED Provider at bedside. 

## 2017-06-26 NOTE — ED Notes (Signed)
Patient transported to MRI 

## 2017-06-26 NOTE — ED Notes (Signed)
Patient transported to CT 

## 2017-06-27 ENCOUNTER — Observation Stay (HOSPITAL_COMMUNITY): Payer: Medicare Other

## 2017-06-27 ENCOUNTER — Telehealth: Payer: Self-pay | Admitting: Endocrinology

## 2017-06-27 ENCOUNTER — Other Ambulatory Visit: Payer: Self-pay | Admitting: *Deleted

## 2017-06-27 DIAGNOSIS — I639 Cerebral infarction, unspecified: Secondary | ICD-10-CM | POA: Diagnosis present

## 2017-06-27 DIAGNOSIS — I110 Hypertensive heart disease with heart failure: Secondary | ICD-10-CM | POA: Diagnosis present

## 2017-06-27 DIAGNOSIS — G20 Parkinson's disease: Secondary | ICD-10-CM | POA: Diagnosis not present

## 2017-06-27 DIAGNOSIS — I5032 Chronic diastolic (congestive) heart failure: Secondary | ICD-10-CM | POA: Diagnosis not present

## 2017-06-27 DIAGNOSIS — I5030 Unspecified diastolic (congestive) heart failure: Secondary | ICD-10-CM | POA: Diagnosis not present

## 2017-06-27 DIAGNOSIS — R0789 Other chest pain: Secondary | ICD-10-CM | POA: Diagnosis not present

## 2017-06-27 DIAGNOSIS — I672 Cerebral atherosclerosis: Secondary | ICD-10-CM | POA: Diagnosis present

## 2017-06-27 DIAGNOSIS — Z8551 Personal history of malignant neoplasm of bladder: Secondary | ICD-10-CM | POA: Diagnosis not present

## 2017-06-27 DIAGNOSIS — Z6841 Body Mass Index (BMI) 40.0 and over, adult: Secondary | ICD-10-CM | POA: Diagnosis not present

## 2017-06-27 DIAGNOSIS — R4781 Slurred speech: Secondary | ICD-10-CM | POA: Diagnosis not present

## 2017-06-27 DIAGNOSIS — M1A9XX1 Chronic gout, unspecified, with tophus (tophi): Secondary | ICD-10-CM

## 2017-06-27 DIAGNOSIS — G8929 Other chronic pain: Secondary | ICD-10-CM | POA: Diagnosis present

## 2017-06-27 DIAGNOSIS — E7800 Pure hypercholesterolemia, unspecified: Secondary | ICD-10-CM | POA: Diagnosis present

## 2017-06-27 DIAGNOSIS — Z8547 Personal history of malignant neoplasm of testis: Secondary | ICD-10-CM | POA: Diagnosis not present

## 2017-06-27 DIAGNOSIS — I6381 Other cerebral infarction due to occlusion or stenosis of small artery: Secondary | ICD-10-CM | POA: Diagnosis present

## 2017-06-27 DIAGNOSIS — E271 Primary adrenocortical insufficiency: Secondary | ICD-10-CM | POA: Diagnosis present

## 2017-06-27 DIAGNOSIS — E039 Hypothyroidism, unspecified: Secondary | ICD-10-CM | POA: Diagnosis present

## 2017-06-27 DIAGNOSIS — E785 Hyperlipidemia, unspecified: Secondary | ICD-10-CM | POA: Diagnosis present

## 2017-06-27 DIAGNOSIS — M545 Low back pain: Secondary | ICD-10-CM | POA: Diagnosis present

## 2017-06-27 DIAGNOSIS — G4733 Obstructive sleep apnea (adult) (pediatric): Secondary | ICD-10-CM | POA: Diagnosis present

## 2017-06-27 DIAGNOSIS — I252 Old myocardial infarction: Secondary | ICD-10-CM | POA: Diagnosis not present

## 2017-06-27 DIAGNOSIS — I2510 Atherosclerotic heart disease of native coronary artery without angina pectoris: Secondary | ICD-10-CM | POA: Diagnosis present

## 2017-06-27 DIAGNOSIS — R471 Dysarthria and anarthria: Secondary | ICD-10-CM | POA: Diagnosis present

## 2017-06-27 DIAGNOSIS — Z96652 Presence of left artificial knee joint: Secondary | ICD-10-CM | POA: Diagnosis present

## 2017-06-27 DIAGNOSIS — I63312 Cerebral infarction due to thrombosis of left middle cerebral artery: Secondary | ICD-10-CM | POA: Diagnosis not present

## 2017-06-27 DIAGNOSIS — I6522 Occlusion and stenosis of left carotid artery: Secondary | ICD-10-CM | POA: Diagnosis present

## 2017-06-27 DIAGNOSIS — I63233 Cerebral infarction due to unspecified occlusion or stenosis of bilateral carotid arteries: Secondary | ICD-10-CM | POA: Diagnosis not present

## 2017-06-27 LAB — CUP PACEART REMOTE DEVICE CHECK
Date Time Interrogation Session: 20181212023850
Implantable Pulse Generator Implant Date: 20170719

## 2017-06-27 LAB — LIPID PANEL
Cholesterol: 117 mg/dL (ref 0–200)
HDL: 35 mg/dL — ABNORMAL LOW (ref >40)
LDL Cholesterol: 61 mg/dL (ref 0–99)
Total CHOL/HDL Ratio: 3.3 RATIO
Triglycerides: 105 mg/dL (ref <150)
VLDL: 21 mg/dL (ref 0–40)

## 2017-06-27 LAB — TROPONIN I
Troponin I: <0.03 ng/mL (ref <0.03)
Troponin I: <0.03 ng/mL (ref <0.03)
Troponin I: <0.03 ng/mL (ref <0.03)

## 2017-06-27 LAB — ECHOCARDIOGRAM COMPLETE
Height: 79 in
Weight: 5840 oz

## 2017-06-27 MED ORDER — TESTOSTERONE CYPIONATE 200 MG/ML IM SOLN: 150.0000 mg | INTRAMUSCULAR | Status: DC

## 2017-06-27 MED ORDER — ASPIRIN EC 325 MG PO TBEC
325.0000 mg | DELAYED_RELEASE_TABLET | Freq: Every day | ORAL | Status: DC
  Administered 2017-06-27 – 2017-06-28 (×2): 325 mg via ORAL
  Filled 2017-06-27 (×2): qty 1

## 2017-06-27 MED ORDER — EPINEPHRINE 0.3 MG/0.3ML IJ SOAJ
0.3000 mg | INTRAMUSCULAR | Status: DC | PRN
  Filled 2017-06-27: qty 0.3

## 2017-06-27 MED ORDER — SILDENAFIL CITRATE 20 MG PO TABS
20.0000 mg | ORAL_TABLET | Freq: Three times a day (TID) | ORAL | Status: DC
  Administered 2017-06-27 – 2017-06-28 (×5): 20 mg via ORAL
  Filled 2017-06-27 (×8): qty 1

## 2017-06-27 MED ORDER — ATORVASTATIN CALCIUM 10 MG PO TABS
20.0000 mg | ORAL_TABLET | Freq: Every day | ORAL | Status: DC
  Administered 2017-06-27 – 2017-06-28 (×3): 20 mg via ORAL
  Filled 2017-06-27: qty 2
  Filled 2017-06-27: qty 1
  Filled 2017-06-27: qty 2
  Filled 2017-06-27: qty 1

## 2017-06-27 MED ORDER — LORATADINE 10 MG PO TABS
10.0000 mg | ORAL_TABLET | Freq: Every day | ORAL | Status: DC
  Administered 2017-06-27 – 2017-06-28 (×2): 10 mg via ORAL
  Filled 2017-06-27 (×2): qty 1

## 2017-06-27 MED ORDER — ACETAMINOPHEN 325 MG PO TABS: 650.0000 mg | ORAL_TABLET | ORAL | Status: DC | PRN

## 2017-06-27 MED ORDER — FEBUXOSTAT 40 MG PO TABS
40.0000 mg | ORAL_TABLET | Freq: Every day | ORAL | Status: DC
  Administered 2017-06-27 – 2017-06-28 (×2): 40 mg via ORAL
  Filled 2017-06-27 (×2): qty 1

## 2017-06-27 MED ORDER — GUAIFENESIN-DM 100-10 MG/5ML PO SYRP: 5.0000 mL | ORAL_SOLUTION | ORAL | Status: DC | PRN

## 2017-06-27 MED ORDER — FUROSEMIDE 40 MG PO TABS
40.0000 mg | ORAL_TABLET | Freq: Every day | ORAL | Status: DC
  Administered 2017-06-27 – 2017-06-28 (×2): 40 mg via ORAL
  Filled 2017-06-27: qty 1
  Filled 2017-06-27: qty 2

## 2017-06-27 MED ORDER — COLCHICINE 0.6 MG PO TABS
0.6000 mg | ORAL_TABLET | Freq: Two times a day (BID) | ORAL | Status: DC
  Administered 2017-06-27 – 2017-06-28 (×4): 0.6 mg via ORAL
  Filled 2017-06-27 (×4): qty 1

## 2017-06-27 MED ORDER — SERTRALINE HCL 100 MG PO TABS
100.0000 mg | ORAL_TABLET | Freq: Every day | ORAL | Status: DC
  Administered 2017-06-27 – 2017-06-28 (×2): 100 mg via ORAL
  Filled 2017-06-27 (×2): qty 1

## 2017-06-27 MED ORDER — IOPAMIDOL (ISOVUE-370) INJECTION 76%
INTRAVENOUS | Status: AC
  Administered 2017-06-27: 50 mL
  Filled 2017-06-27: qty 50

## 2017-06-27 MED ORDER — PERFLUTREN LIPID MICROSPHERE
1.0000 mL | INTRAVENOUS | Status: AC | PRN
  Administered 2017-06-27: 3 mL via INTRAVENOUS
  Filled 2017-06-27: qty 10

## 2017-06-27 MED ORDER — ASPIRIN EC 81 MG PO TBEC: 81.0000 mg | DELAYED_RELEASE_TABLET | Freq: Every day | ORAL | Status: DC

## 2017-06-27 MED ORDER — IOPAMIDOL (ISOVUE-370) INJECTION 76%
INTRAVENOUS | Status: AC
  Filled 2017-06-27: qty 50

## 2017-06-27 MED ORDER — HYDROCORTISONE 20 MG PO TABS
20.0000 mg | ORAL_TABLET | Freq: Every day | ORAL | Status: DC
  Administered 2017-06-27 – 2017-06-28 (×2): 20 mg via ORAL
  Filled 2017-06-27 (×2): qty 1

## 2017-06-27 MED ORDER — CARBIDOPA-LEVODOPA 25-250 MG PO TABS
1.0000 | ORAL_TABLET | Freq: Four times a day (QID) | ORAL | Status: DC
  Administered 2017-06-27: 1 via ORAL
  Filled 2017-06-27 (×3): qty 1

## 2017-06-27 MED ORDER — MOMETASONE FURO-FORMOTEROL FUM 200-5 MCG/ACT IN AERO
2.0000 | INHALATION_SPRAY | Freq: Two times a day (BID) | RESPIRATORY_TRACT | Status: DC
  Administered 2017-06-28: 2 via RESPIRATORY_TRACT
  Filled 2017-06-27 (×2): qty 8.8

## 2017-06-27 MED ORDER — CARVEDILOL 3.125 MG PO TABS
3.1250 mg | ORAL_TABLET | Freq: Two times a day (BID) | ORAL | Status: DC
  Administered 2017-06-27 – 2017-06-28 (×4): 3.125 mg via ORAL
  Filled 2017-06-27 (×5): qty 1

## 2017-06-27 MED ORDER — POLYETHYLENE GLYCOL 3350 17 G PO PACK: 17.0000 g | PACK | Freq: Every day | ORAL | Status: DC | PRN

## 2017-06-27 MED ORDER — ONDANSETRON HCL 4 MG/2ML IJ SOLN: 4.0000 mg | Freq: Four times a day (QID) | INTRAMUSCULAR | Status: DC | PRN

## 2017-06-27 MED ORDER — LEVOTHYROXINE SODIUM 112 MCG PO TABS
112.0000 ug | ORAL_TABLET | Freq: Every day | ORAL | Status: DC
  Administered 2017-06-27 – 2017-06-28 (×2): 112 ug via ORAL
  Filled 2017-06-27 (×2): qty 1

## 2017-06-27 MED ORDER — SENNOSIDES-DOCUSATE SODIUM 8.6-50 MG PO TABS
1.0000 | ORAL_TABLET | Freq: Every day | ORAL | Status: DC
  Administered 2017-06-27: 1 via ORAL
  Filled 2017-06-27 (×2): qty 1

## 2017-06-27 MED ORDER — CARBIDOPA-LEVODOPA 25-250 MG PO TABS
2.0000 | ORAL_TABLET | Freq: Four times a day (QID) | ORAL | Status: DC
  Administered 2017-06-27 – 2017-06-28 (×6): 2 via ORAL
  Filled 2017-06-27 (×10): qty 2

## 2017-06-27 MED ORDER — CLOPIDOGREL BISULFATE 75 MG PO TABS
75.0000 mg | ORAL_TABLET | Freq: Every day | ORAL | Status: DC
  Administered 2017-06-27 – 2017-06-28 (×2): 75 mg via ORAL
  Filled 2017-06-27 (×2): qty 1

## 2017-06-27 MED ORDER — ALBUTEROL SULFATE (2.5 MG/3ML) 0.083% IN NEBU: 2.5000 mg | INHALATION_SOLUTION | Freq: Four times a day (QID) | RESPIRATORY_TRACT | Status: DC | PRN

## 2017-06-27 MED ORDER — ALBUTEROL SULFATE HFA 108 (90 BASE) MCG/ACT IN AERS: 2.0000 | INHALATION_SPRAY | Freq: Four times a day (QID) | RESPIRATORY_TRACT | Status: DC | PRN

## 2017-06-27 MED ORDER — ADULT MULTIVITAMIN W/MINERALS CH
1.0000 | ORAL_TABLET | Freq: Every day | ORAL | Status: DC
  Administered 2017-06-27 – 2017-06-28 (×2): 1 via ORAL
  Filled 2017-06-27 (×2): qty 1

## 2017-06-27 MED ORDER — ENSURE ENLIVE PO LIQD
237.0000 mL | Freq: Three times a day (TID) | ORAL | Status: DC
  Administered 2017-06-27 – 2017-06-28 (×4): 237 mL via ORAL
  Filled 2017-06-27: qty 237
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
## 2017-06-27 NOTE — ED Notes (Signed)
Echo @ bedside

## 2017-06-27 NOTE — Progress Notes (Signed)
PROGRESS NOTE    Dennis Malter Sr.  FAO:[id number] DOB: [birth year] DOA: 06/26/2017 PCP: Mar Daring, PA-C   Outpatient Specialists:     Brief Narrative:  Dennis Nations Besser Sr. is a 71 y.o. male with medical history significant of chronic [place] on ranexa, addisons, chf, obesity, CAD recently went to rehab for iv abx for cellulitis comes in today after an episode of substernal chest pressure "like an elephant" , he happened to be on the phone with his wife when this occurred, he got dizzy sob and had some slurred speech.  He has been doing well with rehab and PT.  This lasted for over an hour.  He feels back to normal now.  No fevers.  No swelling in his legs.  Pt referred for admission for his chest pain and possible stroke.  Neuro called who advised mri brain.  MRI positive for CVA-- seen by neuro-- ASA/plavix for small vessel disease.    Assessment & Plan:   Principal Problem:   Chest pain Active Problems:   Chronic back pain   Chronic diastolic CHF (congestive heart failure) (HCC)   Chronic tophaceous gout   Morbid obesity (HCC)   TIA (transient ischemic attack)   Addison disease ([place])   Primary Parkinsonism ([place])   Slurred speech   CVA (cerebral vascular accident) ([place])   Chest pain-  - ekg nonischemic  -troponin negative - Cont medical management -echo done as part of CVA work up  CVA -MRI + -small vessel disease -ASA/plavix -lipitor LDL <70 -last loop recorder interrogation did not show a fib    Chronic back pain- cont home meds    Chronic diastolic CHF (congestive heart failure) ([place])- stable and compensated    Chronic tophaceous gout- noted    Morbid obesity ([place]) Body mass index is 41.12 kg/m.   OSA -CPAP    Addison disease ([place]) - Cont home cortef dose    Primary Parkinsonism ([place])- resume home meds  At increased dose of 25/250 -- 2 tablets QID  From SNF but was to be discharged home tomorrow.  PT/OT eval for safe discharge -just  d/c'd from family medicine teaching service- if patient to remain in hospital will transfer to their service in AM   DVT prophylaxis:   SCD's  Code Status: Full Code   Family Communication:   Disposition Plan:  Pending PT/OT eval   Consultants:   neuro    Subjective: Frustrated with recurrent stroke issues  Objective: Vitals:   06/27/17 1100 06/27/17 1130 06/27/17 1330 06/27/17 1400  BP: 129/80 128/79 126/79 125/69  Pulse: 68 89 70 71  Resp: (!) 22 19 (!) 25 (!) 29  Temp:      TempSrc:      SpO2: 94% 97% 92% 93%  Weight:      Height:       No intake or output data in the 24 hours ending 06/27/17 1515 Filed Weights   06/26/17 1806  Weight: (!) 165.6 kg (365 lb)    Examination:  General exam: Appears calm and comfortable  Respiratory system: no wheezing Cardiovascular system: rrr Gastrointestinal system: +Bs, obese Central nervous system: Alert and oriented. No focal neurological deficits. Extremities: Symmetric 5 x 5 power. Skin: chronic skin changes on b/l LE Psychiatry: Judgement and insight appear normal. Mood & affect appropriate.     Data Reviewed: I have personally reviewed following labs and imaging studies  CBC: Recent Labs  Lab 06/26/17 1816  WBC 8.4  HGB 14.6  HCT 46.5  MCV 84.5  PLT 272   Basic Metabolic Panel: Recent Labs  Lab 06/26/17 1816  NA 137  K 3.8  CL 99*  CO2 28  GLUCOSE 101*  BUN 16  CREATININE 1.21  CALCIUM 9.1   GFR: Estimated Creatinine Clearance: 97 mL/min (by C-G formula based on SCr of 1.21 mg/dL). Liver Function Tests: No results for input(s): AST, ALT, ALKPHOS, BILITOT, PROT, ALBUMIN in the last 168 hours. No results for input(s): LIPASE, AMYLASE in the last 168 hours. No results for input(s): AMMONIA in the last 168 hours. Coagulation Profile: No results for input(s): INR, PROTIME in the last 168 hours. Cardiac Enzymes: Recent Labs  Lab 06/27/17 0102 06/27/17 0325 06/27/17 0600  TROPONINI  <0.03 <0.03 <0.03   BNP (last 3 results) No results for input(s): PROBNP in the last 8760 hours. HbA1C: No results for input(s): HGBA1C in the last 72 hours. CBG: No results for input(s): GLUCAP in the last 168 hours. Lipid Profile: Recent Labs    06/27/17 0102  CHOL 117  HDL 35*  LDLCALC 61  TRIG 105  CHOLHDL 3.3   Thyroid Function Tests: No results for input(s): TSH, T4TOTAL, FREET4, T3FREE, THYROIDAB in the last 72 hours. Anemia Panel: No results for input(s): VITAMINB12, FOLATE, FERRITIN, TIBC, IRON, RETICCTPCT in the last 72 hours. Urine analysis:    Component Value Date/Time   COLORURINE YELLOW (A) 05/27/2017 1643   APPEARANCEUR CLEAR (A) 05/27/2017 1643   APPEARANCEUR Clear 08/11/2012 2032   LABSPEC 1.021 05/27/2017 1643   LABSPEC 1.017 08/11/2012 2032   PHURINE 5.0 05/27/2017 1643   GLUCOSEU NEGATIVE 05/27/2017 1643   GLUCOSEU Negative 08/11/2012 2032   HGBUR NEGATIVE 05/27/2017 1643   BILIRUBINUR NEGATIVE 05/27/2017 1643   BILIRUBINUR Negative 08/11/2012 2032   KETONESUR 5 (A) 05/27/2017 1643   PROTEINUR NEGATIVE 05/27/2017 1643   UROBILINOGEN 2.0 (H) 08/04/2014 2344   NITRITE NEGATIVE 05/27/2017 1643   LEUKOCYTESUR NEGATIVE 05/27/2017 1643   LEUKOCYTESUR Trace 08/11/2012 2032    )No results found for this or any previous visit (from the past 240 hour(s)).    Anti-infectives (From admission, onward)   None       Radiology Studies: Ct Angio Head W Or Wo Contrast  Result Date: 06/27/2017 CLINICAL DATA:  Stroke follow-up. Acute left caudate infarct on MRI. Slurred speech. EXAM: CT ANGIOGRAPHY HEAD AND NECK TECHNIQUE: Multidetector CT imaging of the head and neck was performed using the standard protocol during bolus administration of intravenous contrast. Multiplanar CT image reconstructions and MIPs were obtained to evaluate the vascular anatomy. Carotid stenosis measurements (when applicable) are obtained utilizing NASCET criteria, using the distal  internal carotid diameter as the denominator. CONTRAST:  30mL ISOVUE-370 IOPAMIDOL (ISOVUE-370) INJECTION 76% COMPARISON:  Head MRA today.  Head and neck CTA 03/19/2016. FINDINGS: CTA NECK FINDINGS Aortic arch: Standard 3 vessel aortic arch with mild atherosclerotic plaque. Mild plaque in the left greater than right subclavian arteries is unchanged from the prior CTA and without significant associated stenosis. Right carotid system: Patent with mild, predominantly calcified plaque at the carotid bifurcation and in the proximal ICA without stenosis, unchanged. Retropharyngeal course of the proximal ICA. Left carotid system: Patent with bulky eccentric calcified plaque in the distal common carotid artery resulting in 50% stenosis, unchanged. Mild nonstenotic plaque in the proximal ICA. Vertebral arteries: Both vertebral arteries are patent. The left vertebral artery is dominant and without significant stenosis. There is mild multifocal narrowing versus artifact in the V1 and proximal V2  segments on the right. Skeleton: Cervical spondylosis with bulky anterior spurring at C4-5. Multilevel facet arthrosis, severe on the left at C2-3. Other neck: No mass or lymph node enlargement. Upper chest: Clear lung apices. Review of the MIP images confirms the above findings CTA HEAD FINDINGS Anterior circulation: The internal carotid arteries are patent from skullbase to carotid termini. There is mild siphon atherosclerosis bilaterally without stenosis. ACAs and MCAs are patent with moderate branch vessel irregularity but no evidence of proximal branch occlusion or flow limiting proximal stenosis. No aneurysm. Posterior circulation: Both vertebral arteries are patent to the basilar with the left being strongly dominant. Left V4 segment atherosclerosis is similar to the prior CTA without significant stenosis. There is mild irregularity of the small right V4 segment without evidence of flow limiting stenosis. Patent left PICA, right  AICA, and bilateral SCA origins are identified. The basilar artery is widely patent. Posterior communicating arteries are not identified. There is mild-to-moderate diffuse atherosclerotic irregularity of both PCAs without evidence of flow limiting proximal stenosis. No aneurysm. Venous sinuses: Patent. Anatomic variants: None. Delayed phase: No abnormal enhancement. Review of the MIP images confirms the above findings IMPRESSION: 1. No large vessel occlusion. 2. Unchanged 50% distal left common carotid artery stenosis. 3. Dominant left vertebral artery which is widely patent. 4. Intracranial atherosclerosis without flow limiting proximal stenosis. Electronically Signed   By: Logan Bores M.D.   On: 06/27/2017 10:50   Dg Chest 2 View  Result Date: 06/26/2017 CLINICAL DATA:  Chest pain EXAM: CHEST  2 VIEW COMPARISON:  05/27/2017 FINDINGS: The heart size and mediastinal contours are within normal limits. Cardiac loop recorder unchanged. Both lungs are clear. The visualized skeletal structures are unremarkable. IMPRESSION: No active cardiopulmonary disease. Electronically Signed   By: Franchot Gallo M.D.   On: 06/26/2017 19:11   Ct Head Wo Contrast  Result Date: 06/26/2017 CLINICAL DATA:  Slurred speech EXAM: CT HEAD WITHOUT CONTRAST TECHNIQUE: Contiguous axial images were obtained from the base of the skull through the vertex without intravenous contrast. COMPARISON:  MRI 12/05/2016, CT brain 11/15/2016, 03/17/2016 FINDINGS: Brain: No acute territorial infarction, hemorrhage or intracranial mass is visualized. Age indeterminate hypodensity in the region of left caudate, new since prior CT from May 2018. Mild small vessel ischemic changes of the white matter. Atrophy. Stable ventricle size. Enlarged cisterna magna versus retro cerebellar arachnoid cyst as before. Vascular: No hyperdense vessels.  Carotid artery calcification. Skull: No fracture.  Small fluid in the inferior mastoid air cells. Sinuses/Orbits:  Mucosal thickening in the ethmoid sinuses. No acute orbital abnormality. Other: None IMPRESSION: 1. Age indeterminate hypodensity/possible lacunar infarct in the region of the left caudate nucleus. This is new compared with 11/15/2016. Negative for hemorrhage or intracranial mass or significant mass effect 2. Atrophy with small vessel ischemic changes of the white matter Electronically Signed   By: Donavan Foil M.D.   On: 06/26/2017 20:56   Ct Angio Neck W Or Wo Contrast  Result Date: 06/27/2017 CLINICAL DATA:  Stroke follow-up. Acute left caudate infarct on MRI. Slurred speech. EXAM: CT ANGIOGRAPHY HEAD AND NECK TECHNIQUE: Multidetector CT imaging of the head and neck was performed using the standard protocol during bolus administration of intravenous contrast. Multiplanar CT image reconstructions and MIPs were obtained to evaluate the vascular anatomy. Carotid stenosis measurements (when applicable) are obtained utilizing NASCET criteria, using the distal internal carotid diameter as the denominator. CONTRAST:  71mL ISOVUE-370 IOPAMIDOL (ISOVUE-370) INJECTION 76% COMPARISON:  Head MRA today.  Head and  neck CTA 03/19/2016. FINDINGS: CTA NECK FINDINGS Aortic arch: Standard 3 vessel aortic arch with mild atherosclerotic plaque. Mild plaque in the left greater than right subclavian arteries is unchanged from the prior CTA and without significant associated stenosis. Right carotid system: Patent with mild, predominantly calcified plaque at the carotid bifurcation and in the proximal ICA without stenosis, unchanged. Retropharyngeal course of the proximal ICA. Left carotid system: Patent with bulky eccentric calcified plaque in the distal common carotid artery resulting in 50% stenosis, unchanged. Mild nonstenotic plaque in the proximal ICA. Vertebral arteries: Both vertebral arteries are patent. The left vertebral artery is dominant and without significant stenosis. There is mild multifocal narrowing versus  artifact in the V1 and proximal V2 segments on the right. Skeleton: Cervical spondylosis with bulky anterior spurring at C4-5. Multilevel facet arthrosis, severe on the left at C2-3. Other neck: No mass or lymph node enlargement. Upper chest: Clear lung apices. Review of the MIP images confirms the above findings CTA HEAD FINDINGS Anterior circulation: The internal carotid arteries are patent from skullbase to carotid termini. There is mild siphon atherosclerosis bilaterally without stenosis. ACAs and MCAs are patent with moderate branch vessel irregularity but no evidence of proximal branch occlusion or flow limiting proximal stenosis. No aneurysm. Posterior circulation: Both vertebral arteries are patent to the basilar with the left being strongly dominant. Left V4 segment atherosclerosis is similar to the prior CTA without significant stenosis. There is mild irregularity of the small right V4 segment without evidence of flow limiting stenosis. Patent left PICA, right AICA, and bilateral SCA origins are identified. The basilar artery is widely patent. Posterior communicating arteries are not identified. There is mild-to-moderate diffuse atherosclerotic irregularity of both PCAs without evidence of flow limiting proximal stenosis. No aneurysm. Venous sinuses: Patent. Anatomic variants: None. Delayed phase: No abnormal enhancement. Review of the MIP images confirms the above findings IMPRESSION: 1. No large vessel occlusion. 2. Unchanged 50% distal left common carotid artery stenosis. 3. Dominant left vertebral artery which is widely patent. 4. Intracranial atherosclerosis without flow limiting proximal stenosis. Electronically Signed   By: Logan Bores M.D.   On: 06/27/2017 10:50   [name] Head Wo Contrast  Result Date: 06/27/2017 CLINICAL DATA:  Acute presentation with slurred speech. MR done yesterday showed 15 mm left caudate infarction. EXAM: MRA HEAD WITHOUT CONTRAST TECHNIQUE: Angiographic images of the  Circle of Willis were obtained using MRA technique without intravenous contrast. COMPARISON:  06/26/2017.  11/15/2016. FINDINGS: Both internal carotid arteries are patent through the skullbase and siphon regions. On the left, the anterior and middle cerebral arteries are patent. There is moderate atherosclerotic irregularity of the M2 to M3 branches. On the right, there is artifactual appearance of A1 stenosis on the segmented images, not present on the unsegmented images and therefore an artifact of post processing. Chronic mild to moderate stenosis in the M2 and M3 branches. Both vertebral arteries are patent to the basilar with the left being dominant. No basilar stenosis. Posterior circulation branch vessels are patent with atherosclerotic narrowing and irregularity of the more distal superior cerebellar and posterior cerebral branch vessels. Similar appearance to the previous study. IMPRESSION: Intracranial medium vessel atherosclerotic change, similar to the study of 11/15/2016. No new disease or evidence of definable interval vascular occlusion. Electronically Signed   By: Nelson Chimes M.D.   On: 06/27/2017 10:31   Mr Brain Wo Contrast  Result Date: 06/27/2017 CLINICAL DATA:  Initial evaluation for acute slurred speech. EXAM: MRI HEAD WITHOUT CONTRAST  TECHNIQUE: Multiplanar, multiecho pulse sequences of the brain and surrounding structures were obtained without intravenous contrast. COMPARISON:  Prior CT from earlier the same day as well as previous MRI from 11/15/2016. FINDINGS: Brain: Generalized age-related cerebral atrophy with mild to moderate chronic small vessel ischemic disease, stable. Small remote lacunar infarct present within the left thalamus. 15 mm acute ischemic lacunar infarct involving the left caudate head (series 3, image 31). No associated hemorrhage or mass effect. No other evidence for acute or subacute ischemia. Gray-white matter differentiation otherwise maintained. No other acute  or chronic intracranial hemorrhage. Prominent retrocerebellar collection, either reflecting a mega cisterna magna or arachnoid cyst noted, stable. No mass lesion or midline shift. No hydrocephalus. No other extra-axial fluid collection. Major dural sinuses are grossly patent. Pituitary gland suprasellar region normal. Midline structures intact and normal. Vascular: Major intracranial vascular flow voids maintained. Dolichoectatic cerebral vasculature again noted. Skull and upper cervical spine: Craniocervical junction normal. Upper cervical spine within normal limits. Bone marrow signal intensity normal. No scalp soft tissue abnormality. Sinuses/Orbits: Globes oral soft tissues within normal limits. Postoperative changes present about the right globe. Mild ethmoidal sinus mucosal thickening. Paranasal sinuses otherwise clear. Trace bilateral mastoid effusions, of doubtful significance. Inner ear structures normal. Other: None. IMPRESSION: 1. 15 mm acute ischemic nonhemorrhagic left caudate lacunar infarct. 2. Atrophy with chronic microvascular ischemic disease, stable. 3. Electronically Signed   By: Jeannine Boga M.D.   On: 06/27/2017 01:03   Ct Angio Chest/abd/pel For Dissection W And/or Wo Contrast  Result Date: 06/26/2017 CLINICAL DATA:  Chest pain and pressure. Acute aortic syndrome suspected. EXAM: CT ANGIOGRAPHY CHEST, ABDOMEN AND PELVIS TECHNIQUE: Multidetector CT imaging through the chest, abdomen and pelvis was performed using the standard protocol during bolus administration of intravenous contrast. Multiplanar reconstructed images and MIPs were obtained and reviewed to evaluate the vascular anatomy. CONTRAST:  140mL ISOVUE-370 IOPAMIDOL (ISOVUE-370) INJECTION 76% COMPARISON:  Prior chest radiographs.  Abdominal CT 11/09/2016. FINDINGS: CTA CHEST FINDINGS Cardiovascular: Thoracic aorta is normal in caliber. Mild aortic atherosclerosis. No dissection, hematoma, or evidence of acute aortic  syndrome. No periaortic stranding. Central pulmonary arteries are patent without central pulmonary embolus to the lobar level. Coronary artery calcifications, heart size upper normal. Mediastinum/Nodes: No enlarged mediastinal or hilar lymph nodes. No axillary adenopathy. Visualized thyroid gland is normal. Esophagus is decompressed. Implanted loop recorder in the left chest. Lungs/Pleura: No consolidation, pulmonary edema or pleural effusion. Minimal linear and hypoventilatory atelectasis in the lung bases. No pulmonary mass or dominant nodule. Musculoskeletal: Mild degenerative change in the thoracic spine. There are no acute or suspicious osseous abnormalities. Review of the MIP images confirms the above findings. CTA ABDOMEN AND PELVIS FINDINGS VASCULAR Aorta: Normal caliber aorta without aneurysm, dissection, vasculitis or significant stenosis. Moderate calcified atherosclerosis. Celiac: Patent without evidence of aneurysm, dissection, vasculitis or significant stenosis. SMA: Patent without evidence of aneurysm, dissection, vasculitis or significant stenosis. Renals: Both renal arteries are patent without evidence of aneurysm, dissection, vasculitis, fibromuscular dysplasia or significant stenosis. Tiny accessory left upper pole renal artery on the left. IMA: Patent without evidence of aneurysm, dissection, vasculitis or significant stenosis. Inflow: Patent without evidence of aneurysm, dissection, vasculitis or significant stenosis. Veins: No obvious venous abnormality within the limitations of this arterial phase study. Review of the MIP images confirms the above findings. NON-VASCULAR Hepatobiliary: No focal hepatic lesion. Gallbladder physiologically distended, no calcified stone. No biliary dilatation. Pancreas: No ductal dilatation or inflammation. Spleen: Elongated without focal abnormality. Normal arterial phase enhancement. Adrenals/Urinary  Tract: Normal adrenal glands. No hydronephrosis or perinephric  edema. Urinary bladder is minimally distended without wall thickening. Stomach/Bowel: Stomach is within normal limits. Appendix appears normal. No evidence of bowel wall thickening, distention, or inflammatory changes. Lymphatic: No enlarged abdominal or pelvic lymph nodes. Reproductive: Prostate is unremarkable. Other: Fat in both inguinal canals.  New free air or ascites. Musculoskeletal: Degenerative and postsurgical change in the lumbar spine. No acute osseous abnormalities. Review of the MIP images confirms the above findings. IMPRESSION: 1. Mild thoracoabdominal aortic atherosclerosis without acute aortic abnormality or aneurysm. There are coronary artery calcifications. 2. No acute abnormality in the chest, abdomen, or pelvis. Electronically Signed   By: Jeb Levering M.D.   On: 06/26/2017 21:18        Scheduled Meds: . aspirin EC  325 mg Oral Daily  . atorvastatin  20 mg Oral q1800  . carbidopa-levodopa  2 tablet Oral QID  . carvedilol  3.125 mg Oral BID WC  . clopidogrel  75 mg Oral Daily  . colchicine  0.6 mg Oral BID  . febuxostat  40 mg Oral Daily  . feeding supplement (ENSURE ENLIVE)  237 mL Oral TID BM  . furosemide  40 mg Oral Daily  . hydrocortisone  20 mg Oral Daily  . iopamidol      . levothyroxine  112 mcg Oral QAC breakfast  . loratadine  10 mg Oral Daily  . mometasone-formoterol  2 puff Inhalation BID  . multivitamin with minerals  1 tablet Oral Daily  . senna-docusate  1 tablet Oral Daily  . sertraline  100 mg Oral Daily  . sildenafil  20 mg Oral TID   Continuous Infusions:   LOS: 0 days    Time spent: 35 min    Geradine Girt, DO [facility] Pager [phone]  If 7PM-7AM, please contact night-coverage [email or web address] Password [email or web address] 06/27/2017, 3:15 PM

## 2017-06-27 NOTE — Telephone Encounter (Signed)
Pt wife called about pt having another stroke last night he was admitted into the hospital. Pt needs a Rx Testosterone injection. Please advise?  Call pt wife @  [phone]. Thank you!

## 2017-06-27 NOTE — Progress Notes (Addendum)
NEUROHOSPITALISTS STROKE TEAM - DAILY PROGRESS NOTE   ADMISSION HISTORY: Dennis Flanagan Milberger Sr. is an 71 y.o. male with a history ofrecurrentCVA, chronic back pain, CHF, coronary artery disease, hypothyroidism, hypercholesterolemia, hypertension, bladder cancer, recently diagnosed Parkinson's disease presents to the emergency room after developing crushing chest pain earlier today. He also stated that around 3 PM he developed slurred speech that lasted for about 2 hours. His symptoms completely resolved on arrival to the emergency room. Given multiple history of strokes and MRI was performed which showed a new ischemic infarct in the left caudate. Neurologist consultation for further evaluation  Date last known well: 12.17. 18 Time last known well: 3 pm tPA Given: No symptoms resolved, outside window  NIHSS: 2 Baseline MRS 1  SUBJECTIVE (INTERVAL HISTORY) Wife is at the bedside. Patient is found laying in bed in NAD. Overall he feels his condition is gradually improving. Voices no new complaints. No new events reported overnight.  OBJECTIVE Lab Results: CBC:  Recent Labs  Lab 06/26/17 1816  WBC 8.4  HGB 14.6  HCT 46.5  MCV 84.5  PLT 159   BMP: Recent Labs  Lab 06/26/17 1816  NA 137  K 3.8  CL 99*  CO2 28  GLUCOSE 101*  BUN 16  CREATININE 1.21  CALCIUM 9.1   Cardiac Enzymes:  Recent Labs  Lab 06/27/17 0102 06/27/17 0325 06/27/17 0600  TROPONINI <0.03 <0.03 <0.03   PHYSICAL EXAM Temp:  [97.9 F (36.6 C)] 97.9 F (36.6 C) (12/18 0358) Pulse Rate:  [66-89] 89 (12/18 1130) Resp:  [10-31] 19 (12/18 1130) BP: (80-145)/(54-98) 128/79 (12/18 1130) SpO2:  [88 %-100 %] 97 % (12/18 1130) Weight:  [165.6 kg (365 lb)] 165.6 kg (365 lb) (12/17 1806) General - Well nourished, well developed, in no apparent distress Respiratory - Lungs clear bilaterally. No wheezing. Cardiovascular - Regular rate and rhythm    Neurological Examination Mental Status: Alert, oriented, thought content appropriate.  Speech fluent without evidence of aphasia. Able to follow 3 step commands without difficulty. Cranial Nerves: II: Visual fields grossly normal,  III,IV, VI: ptosis not present, extra-ocular motions intact bilaterally, pupils equal, round, reactive to light and accommodation V,VII: smile symmetric, facial light touch sensation normal bilaterally VIII: hearing normal bilaterally IX,X: uvula rises symmetrically XI: bilateral shoulder shrug XII: midline tongue extension Motor: Right :  Upper extremity   5/5                                      Left:     Upper extremity   4+/5             Lower extremity   5/5                                                  Lower extremity   4/5 Tone and bulk:normal tone throughout; no atrophy noted Sensory: Pinprick and light touch intact throughout, bilaterally Deep Tendon Reflexes: 2+ and symmetric throughout Plantars: Right: downgoing                                Left: downgoing Cerebellar: normal finger-to-nose, normal rapid alternating movements and normal heel-to-shin test Gait: normal gait and  station   IMAGING: I have personally reviewed the radiological images below and agree with the radiology interpretations.  Ct Angio Head and neck W Or Wo Contrast Result Date: 06/27/2017 IMPRESSION: 1. No large vessel occlusion. 2. Unchanged 50% distal left common carotid artery stenosis. 3. Dominant left vertebral artery which is widely patent. 4. Intracranial atherosclerosis without flow limiting proximal stenosis. Electronically Signed   By: Logan Bores M.D.   On: 06/27/2017 10:50   Dg Chest 2 View Result Date: 06/26/2017 CLINICAL DATA:  Chest pain EXAM: CHEST  2 VIEW COMPARISON:  05/27/2017 FINDINGS: The heart size and mediastinal contours are within normal limits. Cardiac loop recorder unchanged. Both lungs are clear. The visualized skeletal structures are  unremarkable. IMPRESSION: No active cardiopulmonary disease. Electronically Signed   By: Franchot Gallo M.D.   On: 06/26/2017 19:11   Ct Head Wo Contrast Result Date: 06/26/2017 IMPRESSION: 1. Age indeterminate hypodensity/possible lacunar infarct in the region of the left caudate nucleus. This is new compared with 11/15/2016. Negative for hemorrhage or intracranial mass or significant mass effect 2. Atrophy with small vessel ischemic changes of the white matter Electronically Signed   By: Donavan Foil M.D.   On: 06/26/2017 20:56    Dennis [name] Head Wo Contrast Result Date: 06/27/2017 IMPRESSION: Intracranial medium vessel atherosclerotic change, similar to the study of 11/15/2016. No new disease or evidence of definable interval vascular occlusion. Electronically Signed   By: Nelson Chimes M.D.   On: 06/27/2017 10:31   Dennis Brain Wo Contrast Result Date: 06/27/2017 IMPRESSION: 1. 15 mm acute ischemic nonhemorrhagic left caudate lacunar infarct. 2. Atrophy with chronic microvascular ischemic disease, stable. 3. Electronically Signed   By: Jeannine Boga M.D.   On: 06/27/2017 01:03   Ct Angio Chest/abd/pel For Dissection W And/or Wo Contrast Result Date: 06/26/2017  IMPRESSION: 1. Mild thoracoabdominal aortic atherosclerosis without acute aortic abnormality or aneurysm. There are coronary artery calcifications. 2. No acute abnormality in the chest, abdomen, or pelvis. Electronically Signed   By: Jeb Levering M.D.   On: 06/26/2017 21:18    Echocardiogram:                                              PENDING     ASSESSMENT: Dennis. Shine Mikes Karpowicz Sr. is a 71 y.o. male with PMH of recurrentCVA, chronic back pain, CHF, coronary artery disease, hypothyroidism, hypercholesterolemia, hypertension, bladder cancer, recently diagnosed Parkinson's disease admitted for complaints of slurred speech and worsening left-sided weakness.  MRI reveals:  15 mm acute ischemic nonhemorrhagic left caudate  lacunar infarct.  STROKE:  Suspected Etiology: likely small vessel vs cardioembolic Resultant Symptoms: slurred speech, worsening Left sided weakness  Stroke Risk Factors: hyperlipidemia and hypertension, Hx Bladder Ca Other Stroke Risk Factors: Advanced age, Obesity, Body mass index is 41.12 kg/m.  Hx stroke/TIA, CAD, Obstructive sleep apnea, on CPAP at home, CHF, Hypothyroidism  Outstanding Stroke Work-up Studies:     Echocardiogram:                                           PENDING Loop Recorder interrogation  PENDING  06/27/2017:   Neuro exam remained stable.  Dysarthria resolved.  Left-sided weakness at baseline.  Echocardiogram and hemoglobin A1c pending.  Patient describes an episode yesterday of irregular heart rate during the episode of slurred speech and left-sided weakness.  Loop recorder interrogation has been ordered.  Sinemet dose increased to home dosage.    PLAN  06/27/2017: Continue Aspirin/Plavix/ statin Frequent neuro checks Telemetry monitoring PT/OT/SLP Consult PM & Rehab Loop Recorder interogation Ongoing aggressive stroke risk factor management Patient counseled to be compliant with his antithrombotic medications Patient counseled on Lifestyle modifications including, Diet, Exercise, and Stress Follow up with [facility] in 6 weeks  HX OF STROKES: Multiple CVA/TIA's -Baseline Findings:  Left sided weakness  INTRACRANIAL Atherosclerosis 50% distal left common carotid artery stenosis: On DAPT, continue for now  Parkinson's disease Sinemet dose increased to home dosage of 25-250/ 2 tablets by mouth 4 times a day  HYPERTENSION: Stable Permissive hypertension (OK if <220/120) for 24-48 hours post stroke and then gradually normalized within 5-7 days. Long term BP goal normotensive. May slowly restart home B/P medications after 48 hours Home Meds: Coreg, Cozaar  HYPERLIPIDEMIA:    Component Value Date/Time    CHOL 117 06/27/2017 0102   TRIG 105 06/27/2017 0102   HDL 35 (L) 06/27/2017 0102   CHOLHDL 3.3 06/27/2017 0102   VLDL 21 06/27/2017 0102   LDLCALC 61 06/27/2017 0102  Home Meds: Lipitor 20 LDL  goal < 70 Continued on Lipitor to 20 mg daily Continue statin at discharge  DIABETES: HgA1C PENDING Lab Results  Component Value Date   HGBA1C 5.4 02/22/2017  HgbA1c goal < 7.0 Currently on: None Continue CBG monitoring and SSI to maintain glucose 140-180 mg/dl DM education   OBESITY Obesity, Body mass index is 41.12 kg/m. Greater than/equal to 30  Other Active Problems: Principal Problem:   Chest pain Active Problems:   Chronic back pain   Chronic diastolic CHF (congestive heart failure) (HCC)   Chronic tophaceous gout   Morbid obesity (HCC)   TIA (transient ischemic attack)   Addison disease ([place])   Primary Parkinsonism ([place])   Slurred speech  Hospital day # 0 VTE prophylaxis: SCD's  Diet : Diet Heart Room service appropriate? Yes; Fluid consistency: Thin   FAMILY UPDATES:  family at bedside  TEAM UPDATES: Geradine Girt, DO     Prior Home Stroke Medications:  aspirin 81 mg daily and clopidogrel 75 mg daily  Discharge Stroke Meds:  Please discharge patient on aspirin 325 mg daily and clopidogrel 75 mg daily   Disposition: 03-Skilled Nursing Facility vs HOME Therapy Recs:               PENDING Home Equipment:         PENDING Follow Up:  Follow-up Information    Garvin Fila, MD. Schedule an appointment as soon as possible for a visit in 6 week(s).   Specialties:  Neurology, Radiology Contact information: [address] [place] [address] [phone]          [place] [place] -PCP Follow up in 1-2 weeks   Renie Ora Stroke Neurology Team 06/27/2017 12:25 PM  ATTENDING NOTE: I reviewed above note and agree with the assessment and plan. I have made any additions or clarifications directly to the above note. Pt was  seen and examined.   71 year old male with history of Addison's disease, CHF, CAD/MI, HTN, HLD, OSA on CPAP, recent diagnosis of  Parkinson disease on Sinemet admitted for chest pain, slurred speech, double vision for 2 hours, now resolved.  He had a stroke in 01/2016 with left MCA, and a tiny right MCA infarcts.  Symptoms worsened 3 days post stroke, MRI showed left caudate extension of stroke as well as new left motor strip infarct.  TEE negative, no PFO, LDL 149 and A1c 5.8.  Loop recorder placed.  Aspirin changed to Plavix.  Again, patient had right semiovale punctate infarct in 11/2016, MRA intracranial showed intracranial stenosis, carotid Doppler negative, DVT negative, Loop recorder no A. fib, LDL 89.  Put patient on DAPT.  During this admission, Patient follow with Dr. Leonie Man at [facility] initially, later on follow with Dr. Manuella Ghazi at [facility] in [place].   MRI showed left small caudate head infarct.CTA head neck as well as MRA head showed  No LVO, unchanged 50% left CCA stenosis as well as intracranial atherosclerosis.  Loop recorder no A. fib.  EF 60-65%, LDL 61, and A1c pending.  Increase aspirin from 81 to 325, continue Plavix and Lipitor for stroke prevention.  Continue Sinemet for PD treatment.  Continue follow-up with Dr. Manuella Ghazi at [facility] as scheduled.  Aggressive stroke risk factor modification.  Neurology will sign off. Please call with questions. Pt will follow up with Dr. Manuella Ghazi at [facility] on 07/06/17. Thanks for the consult.   Rosalin Hawking, MD PhD Stroke Neurology 06/27/2017 10:16 PM   To contact Stroke Continuity provider, please refer to [email or web address] After hours, contact General Neurology

## 2017-06-27 NOTE — Patient Outreach (Signed)
[name] Care Management  06/27/2017  Dennis Whetsel Vaquerano Sr. 04-May-1946 [id number]   CSW was advised upon visit at [facility] this morning that patient was admitted to ED for ?TIA. SNF visit will be rescheduled for a later date once ED visit/assessment is completed.   Eduard Clos, MSW, [place] Worker  [place] [phone]

## 2017-06-27 NOTE — ED Notes (Signed)
Patient still at MRI

## 2017-06-27 NOTE — Progress Notes (Signed)
  Echocardiogram 2D Echocardiogram has been performed.  Dennis [name] 06/27/2017, 12:23 PM

## 2017-06-27 NOTE — ED Notes (Signed)
Pharmacy at bedside

## 2017-06-27 NOTE — ED Notes (Addendum)
Pharmacy called for pt

## 2017-06-27 NOTE — Progress Notes (Signed)
Patient arrived to 3W28 from ED at this time. Safety precautions and order reviewed with patient. VSS. TELE applied and confirmed. Spouse at bedside. No other distress noted. Will continue to monitor.    Ave Filter, RN

## 2017-06-27 NOTE — Telephone Encounter (Signed)
See message and please advise, Thanks!  

## 2017-06-27 NOTE — ED Notes (Signed)
Heart Healthy diet lunch tray ordered @ 1327.

## 2017-06-27 NOTE — ED Notes (Signed)
Patient returned from MRI.

## 2017-06-27 NOTE — Telephone Encounter (Signed)
He can wait until he is discharged

## 2017-06-27 NOTE — ED Notes (Signed)
Patient transported to MRI 

## 2017-06-27 NOTE — ED Notes (Addendum)
Heart Healthy diet breakfast tray ordered @ 0802.

## 2017-06-27 NOTE — Progress Notes (Signed)
CSW received call from [place] at [facility] wanting to know if pt is being admitted . CSW informed her that at this time it looks like pt will be being admitted. Colletta [place] informed CSW that when pt is discharged, pt can go home as [facility] was planning to discharge pt tomorrow anyway.     Virgie Dad Abigaelle Verley, MSW, [facility] Clinical Social Worker [phone]

## 2017-06-27 NOTE — Telephone Encounter (Signed)
I contacted the patient's wife and advised of message. She wanted to let us know the patient should be discharged and would not be able to come in for his testosterone injection. She had no further questions at this time.

## 2017-06-28 ENCOUNTER — Telehealth: Payer: Self-pay | Admitting: Cardiology

## 2017-06-28 LAB — HEMOGLOBIN A1C
Hgb A1c MFr Bld: 5.5 % (ref 4.8–5.6)
Mean Plasma Glucose: 111 mg/dL

## 2017-06-28 MED ORDER — ASPIRIN 325 MG PO TBEC: 325.0000 mg | DELAYED_RELEASE_TABLET | Freq: Every day | ORAL | 0 refills | Status: DC

## 2017-06-28 NOTE — Discharge Summary (Signed)
[name] Discharge Summary  Patient name: Dennis [name]. Medical record number: [id number] Date of birth: 1946/03/09 Age: 71 y.o. Gender: male Date of Admission: 06/26/2017  Date of Discharge: 06/28/2017 Admitting Physician: Phillips Grout, MD  Primary Care Provider: Mar Daring, PA-C Consultants: Neurology  Indication for Hospitalization: chest pressure  Discharge Diagnoses/Problem List:  Recurrent CVA's- new CVA Addison's disease Parkinson's disease HTN H/o gout Hypothyroidism Hypokalemia OSA Depression HLD H/o previous CVA H/o chronic diastolic CHF H/o prostate and testicular cancer  Disposition: stable  Discharge Condition: home  Discharge Exam: see progress note from day of discharge  Brief [name] Course:  Mr. Dennis [name] is a 71y/o male who presented to the [name] for concern of an acute stroke for his assisted living facility [facility]. He presented with dizziness, slurred speech and substernal chest pressure that felt like "an elephant sat on my chest". It lasted for about two hours and then resolved. While in the [name] he was worked up for possible MI and stroke. For his cardiac work up his Troponin's were negative at <0.03 x 3 and his EKG showed no ST-segment changes nor was it significantly different from previous EKGs. He had an echocardiogram that showed LVEF of 60-65% with LVH and grade 1 diastolic dysfunction. This was largely unchanged from his previous Echo performed on 11/16/2016 which showed a LVEF of 65-70%, otherwise the same as the one performed on 12/18.  His stroke work up was significant for a new left caudate nucleus lacunar infarct first seen in CT imaging and the confirmed with MRI/MRA of the brain and head. His risk stratification labs were all under goal. He was started on Aspirin 81mg  and Plavix by Neurology for small vessel disease. His symptoms have completely resolved on day of discharge and he was  cleared to go home by PT/OT. Neurology will follow him up outpatient in [facility] clinic with Dr. Manuella Ghazi.  His other chronic medical conditions of hypothyroidism, addison's disease, HTN were treated with his home meds and were stable during his [name] admission.  Issues for Follow Up:  1. Recurrent CVA's: Continue Lipitor 20 mg daily and ASA 325 mg daily. HgbA1c goal <7.0.  Significant Procedures:   None  Significant Labs and Imaging:  Recent Labs  Lab 06/26/17 1816  WBC 8.4  HGB 14.6  HCT 46.5  PLT 159   Recent Labs  Lab 06/26/17 1816  NA 137  K 3.8  CL 99*  CO2 28  GLUCOSE 101*  BUN 16  CREATININE 1.21  CALCIUM 9.1   12/18 - Troponin: <0.03 x 3 12/18 - Lipid: Chol 117, Trigly 105, HDL 35, LDL 61 12/18 - HgbA1c: 5.5  Ct Angio Head W Or Wo Contrast  Result Date: 06/27/2017 CLINICAL DATA:  Stroke follow-up. Acute left caudate infarct on MRI. Slurred speech. EXAM: CT ANGIOGRAPHY HEAD AND NECK TECHNIQUE: Multidetector CT imaging of the head and neck was performed using the standard protocol during bolus administration of intravenous contrast. Multiplanar CT image reconstructions and MIPs were obtained to evaluate the vascular anatomy. Carotid stenosis measurements (when applicable) are obtained utilizing NASCET criteria, using the distal internal carotid diameter as the denominator. CONTRAST:  62mL ISOVUE-370 IOPAMIDOL (ISOVUE-370) INJECTION 76% COMPARISON:  Head MRA today.  Head and neck CTA 03/19/2016. FINDINGS: CTA NECK FINDINGS Aortic arch: Standard 3 vessel aortic arch with mild atherosclerotic plaque. Mild plaque in the left greater than right subclavian arteries is unchanged from the prior CTA and without significant associated  stenosis. Right carotid system: Patent with mild, predominantly calcified plaque at the carotid bifurcation and in the proximal ICA without stenosis, unchanged. Retropharyngeal course of the proximal ICA. Left carotid system: Patent with bulky  eccentric calcified plaque in the distal common carotid artery resulting in 50% stenosis, unchanged. Mild nonstenotic plaque in the proximal ICA. Vertebral arteries: Both vertebral arteries are patent. The left vertebral artery is dominant and without significant stenosis. There is mild multifocal narrowing versus artifact in the V1 and proximal V2 segments on the right. Skeleton: Cervical spondylosis with bulky anterior spurring at C4-5. Multilevel facet arthrosis, severe on the left at C2-3. Other neck: No mass or lymph node enlargement. Upper chest: Clear lung apices. Review of the MIP images confirms the above findings CTA HEAD FINDINGS Anterior circulation: The internal carotid arteries are patent from skullbase to carotid termini. There is mild siphon atherosclerosis bilaterally without stenosis. ACAs and MCAs are patent with moderate branch vessel irregularity but no evidence of proximal branch occlusion or flow limiting proximal stenosis. No aneurysm. Posterior circulation: Both vertebral arteries are patent to the basilar with the left being strongly dominant. Left V4 segment atherosclerosis is similar to the prior CTA without significant stenosis. There is mild irregularity of the small right V4 segment without evidence of flow limiting stenosis. Patent left PICA, right AICA, and bilateral SCA origins are identified. The basilar artery is widely patent. Posterior communicating arteries are not identified. There is mild-to-moderate diffuse atherosclerotic irregularity of both PCAs without evidence of flow limiting proximal stenosis. No aneurysm. Venous sinuses: Patent. Anatomic variants: None. Delayed phase: No abnormal enhancement. Review of the MIP images confirms the above findings IMPRESSION: 1. No large vessel occlusion. 2. Unchanged 50% distal left common carotid artery stenosis. 3. Dominant left vertebral artery which is widely patent. 4. Intracranial atherosclerosis without flow limiting proximal  stenosis. Electronically Signed   By: Logan Bores M.D.   On: 06/27/2017 10:50   Dg Chest 2 View  Result Date: 06/26/2017 CLINICAL DATA:  Chest pain EXAM: CHEST  2 VIEW COMPARISON:  05/27/2017 FINDINGS: The heart size and mediastinal contours are within normal limits. Cardiac loop recorder unchanged. Both lungs are clear. The visualized skeletal structures are unremarkable. IMPRESSION: No active cardiopulmonary disease. Electronically Signed   By: Franchot Gallo M.D.   On: 06/26/2017 19:11   Ct Head Wo Contrast  Result Date: 06/26/2017 CLINICAL DATA:  Slurred speech EXAM: CT HEAD WITHOUT CONTRAST TECHNIQUE: Contiguous axial images were obtained from the base of the skull through the vertex without intravenous contrast. COMPARISON:  MRI 12/05/2016, CT brain 11/15/2016, 03/17/2016 FINDINGS: Brain: No acute territorial infarction, hemorrhage or intracranial mass is visualized. Age indeterminate hypodensity in the region of left caudate, new since prior CT from May 2018. Mild small vessel ischemic changes of the white matter. Atrophy. Stable ventricle size. Enlarged cisterna magna versus retro cerebellar arachnoid cyst as before. Vascular: No hyperdense vessels.  Carotid artery calcification. Skull: No fracture.  Small fluid in the inferior mastoid air cells. Sinuses/Orbits: Mucosal thickening in the ethmoid sinuses. No acute orbital abnormality. Other: None IMPRESSION: 1. Age indeterminate hypodensity/possible lacunar infarct in the region of the left caudate nucleus. This is new compared with 11/15/2016. Negative for hemorrhage or intracranial mass or significant mass effect 2. Atrophy with small vessel ischemic changes of the white matter Electronically Signed   By: Donavan Foil M.D.   On: 06/26/2017 20:56   Ct Angio Neck W Or Wo Contrast  Result Date: 06/27/2017 CLINICAL DATA:  Stroke follow-up. Acute left caudate infarct on MRI. Slurred speech. EXAM: CT ANGIOGRAPHY HEAD AND NECK TECHNIQUE:  Multidetector CT imaging of the head and neck was performed using the standard protocol during bolus administration of intravenous contrast. Multiplanar CT image reconstructions and MIPs were obtained to evaluate the vascular anatomy. Carotid stenosis measurements (when applicable) are obtained utilizing NASCET criteria, using the distal internal carotid diameter as the denominator. CONTRAST:  49mL ISOVUE-370 IOPAMIDOL (ISOVUE-370) INJECTION 76% COMPARISON:  Head MRA today.  Head and neck CTA 03/19/2016. FINDINGS: CTA NECK FINDINGS Aortic arch: Standard 3 vessel aortic arch with mild atherosclerotic plaque. Mild plaque in the left greater than right subclavian arteries is unchanged from the prior CTA and without significant associated stenosis. Right carotid system: Patent with mild, predominantly calcified plaque at the carotid bifurcation and in the proximal ICA without stenosis, unchanged. Retropharyngeal course of the proximal ICA. Left carotid system: Patent with bulky eccentric calcified plaque in the distal common carotid artery resulting in 50% stenosis, unchanged. Mild nonstenotic plaque in the proximal ICA. Vertebral arteries: Both vertebral arteries are patent. The left vertebral artery is dominant and without significant stenosis. There is mild multifocal narrowing versus artifact in the V1 and proximal V2 segments on the right. Skeleton: Cervical spondylosis with bulky anterior spurring at C4-5. Multilevel facet arthrosis, severe on the left at C2-3. Other neck: No mass or lymph node enlargement. Upper chest: Clear lung apices. Review of the MIP images confirms the above findings CTA HEAD FINDINGS Anterior circulation: The internal carotid arteries are patent from skullbase to carotid termini. There is mild siphon atherosclerosis bilaterally without stenosis. ACAs and MCAs are patent with moderate branch vessel irregularity but no evidence of proximal branch occlusion or flow limiting proximal stenosis.  No aneurysm. Posterior circulation: Both vertebral arteries are patent to the basilar with the left being strongly dominant. Left V4 segment atherosclerosis is similar to the prior CTA without significant stenosis. There is mild irregularity of the small right V4 segment without evidence of flow limiting stenosis. Patent left PICA, right AICA, and bilateral SCA origins are identified. The basilar artery is widely patent. Posterior communicating arteries are not identified. There is mild-to-moderate diffuse atherosclerotic irregularity of both PCAs without evidence of flow limiting proximal stenosis. No aneurysm. Venous sinuses: Patent. Anatomic variants: None. Delayed phase: No abnormal enhancement. Review of the MIP images confirms the above findings IMPRESSION: 1. No large vessel occlusion. 2. Unchanged 50% distal left common carotid artery stenosis. 3. Dominant left vertebral artery which is widely patent. 4. Intracranial atherosclerosis without flow limiting proximal stenosis. Electronically Signed   By: Logan Bores M.D.   On: 06/27/2017 10:50   [name] Head Wo Contrast  Result Date: 06/27/2017 CLINICAL DATA:  Acute presentation with slurred speech. MR done yesterday showed 15 mm left caudate infarction. EXAM: MRA HEAD WITHOUT CONTRAST TECHNIQUE: Angiographic images of the Circle of Willis were obtained using MRA technique without intravenous contrast. COMPARISON:  06/26/2017.  11/15/2016. FINDINGS: Both internal carotid arteries are patent through the skullbase and siphon regions. On the left, the anterior and middle cerebral arteries are patent. There is moderate atherosclerotic irregularity of the M2 to M3 branches. On the right, there is artifactual appearance of A1 stenosis on the segmented images, not present on the unsegmented images and therefore an artifact of post processing. Chronic mild to moderate stenosis in the M2 and M3 branches. Both vertebral arteries are patent to the basilar with the  left being dominant. No basilar stenosis. Posterior circulation  branch vessels are patent with atherosclerotic narrowing and irregularity of the more distal superior cerebellar and posterior cerebral branch vessels. Similar appearance to the previous study. IMPRESSION: Intracranial medium vessel atherosclerotic change, similar to the study of 11/15/2016. No new disease or evidence of definable interval vascular occlusion. Electronically Signed   By: Nelson Chimes M.D.   On: 06/27/2017 10:31   Mr Brain Wo Contrast  Result Date: 06/27/2017 CLINICAL DATA:  Initial evaluation for acute slurred speech. EXAM: MRI HEAD WITHOUT CONTRAST TECHNIQUE: Multiplanar, multiecho pulse sequences of the brain and surrounding structures were obtained without intravenous contrast. COMPARISON:  Prior CT from earlier the same day as well as previous MRI from 11/15/2016. FINDINGS: Brain: Generalized age-related cerebral atrophy with mild to moderate chronic small vessel ischemic disease, stable. Small remote lacunar infarct present within the left thalamus. 15 mm acute ischemic lacunar infarct involving the left caudate head (series 3, image 31). No associated hemorrhage or mass effect. No other evidence for acute or subacute ischemia. Gray-white matter differentiation otherwise maintained. No other acute or chronic intracranial hemorrhage. Prominent retrocerebellar collection, either reflecting a mega cisterna magna or arachnoid cyst noted, stable. No mass lesion or midline shift. No hydrocephalus. No other extra-axial fluid collection. Major dural sinuses are grossly patent. Pituitary gland suprasellar region normal. Midline structures intact and normal. Vascular: Major intracranial vascular flow voids maintained. Dolichoectatic cerebral vasculature again noted. Skull and upper cervical spine: Craniocervical junction normal. Upper cervical spine within normal limits. Bone marrow signal intensity normal. No scalp soft tissue  abnormality. Sinuses/Orbits: Globes oral soft tissues within normal limits. Postoperative changes present about the right globe. Mild ethmoidal sinus mucosal thickening. Paranasal sinuses otherwise clear. Trace bilateral mastoid effusions, of doubtful significance. Inner ear structures normal. Other: None. IMPRESSION: 1. 15 mm acute ischemic nonhemorrhagic left caudate lacunar infarct. 2. Atrophy with chronic microvascular ischemic disease, stable. 3. Electronically Signed   By: Jeannine Boga M.D.   On: 06/27/2017 01:03   Ct Angio Chest/abd/pel For Dissection W And/or Wo Contrast  Result Date: 06/26/2017 CLINICAL DATA:  Chest pain and pressure. Acute aortic syndrome suspected. EXAM: CT ANGIOGRAPHY CHEST, ABDOMEN AND PELVIS TECHNIQUE: Multidetector CT imaging through the chest, abdomen and pelvis was performed using the standard protocol during bolus administration of intravenous contrast. Multiplanar reconstructed images and MIPs were obtained and reviewed to evaluate the vascular anatomy. CONTRAST:  161mL ISOVUE-370 IOPAMIDOL (ISOVUE-370) INJECTION 76% COMPARISON:  Prior chest radiographs.  Abdominal CT 11/09/2016. FINDINGS: CTA CHEST FINDINGS Cardiovascular: Thoracic aorta is normal in caliber. Mild aortic atherosclerosis. No dissection, hematoma, or evidence of acute aortic syndrome. No periaortic stranding. Central pulmonary arteries are patent without central pulmonary embolus to the lobar level. Coronary artery calcifications, heart size upper normal. Mediastinum/Nodes: No enlarged mediastinal or hilar lymph nodes. No axillary adenopathy. Visualized thyroid gland is normal. Esophagus is decompressed. Implanted loop recorder in the left chest. Lungs/Pleura: No consolidation, pulmonary edema or pleural effusion. Minimal linear and hypoventilatory atelectasis in the lung bases. No pulmonary mass or dominant nodule. Musculoskeletal: Mild degenerative change in the thoracic spine. There are no acute or  suspicious osseous abnormalities. Review of the MIP images confirms the above findings. CTA ABDOMEN AND PELVIS FINDINGS VASCULAR Aorta: Normal caliber aorta without aneurysm, dissection, vasculitis or significant stenosis. Moderate calcified atherosclerosis. Celiac: Patent without evidence of aneurysm, dissection, vasculitis or significant stenosis. SMA: Patent without evidence of aneurysm, dissection, vasculitis or significant stenosis. Renals: Both renal arteries are patent without evidence of aneurysm, dissection, vasculitis, fibromuscular dysplasia or significant  stenosis. Tiny accessory left upper pole renal artery on the left. IMA: Patent without evidence of aneurysm, dissection, vasculitis or significant stenosis. Inflow: Patent without evidence of aneurysm, dissection, vasculitis or significant stenosis. Veins: No obvious venous abnormality within the limitations of this arterial phase study. Review of the MIP images confirms the above findings. NON-VASCULAR Hepatobiliary: No focal hepatic lesion. Gallbladder physiologically distended, no calcified stone. No biliary dilatation. Pancreas: No ductal dilatation or inflammation. Spleen: Elongated without focal abnormality. Normal arterial phase enhancement. Adrenals/Urinary Tract: Normal adrenal glands. No hydronephrosis or perinephric edema. Urinary bladder is minimally distended without wall thickening. Stomach/Bowel: Stomach is within normal limits. Appendix appears normal. No evidence of bowel wall thickening, distention, or inflammatory changes. Lymphatic: No enlarged abdominal or pelvic lymph nodes. Reproductive: Prostate is unremarkable. Other: Fat in both inguinal canals.  New free air or ascites. Musculoskeletal: Degenerative and postsurgical change in the lumbar spine. No acute osseous abnormalities. Review of the MIP images confirms the above findings. IMPRESSION: 1. Mild thoracoabdominal aortic atherosclerosis without acute aortic abnormality or  aneurysm. There are coronary artery calcifications. 2. No acute abnormality in the chest, abdomen, or pelvis. Electronically Signed   By: Jeb Levering M.D.   On: 06/26/2017 21:18    Results/Tests Pending at Time of Discharge:   none  Discharge Medications:  Allergies as of 06/28/2017      Reactions   Bee Venom Anaphylaxis   Shrimp [shellfish Allergy] Anaphylaxis, Other (See Comments)   "just shrimp"   Stadol [butorphanol] Anaphylaxis, Other (See Comments)   respiratory  Distress, couldn't breathe, cardiac arrest   Wasp Venom Anaphylaxis      Medication List    STOP taking these medications   ceFAZolin 2-4 GM/100ML-% IVPB Commonly known as:  ANCEF   oxyCODONE-acetaminophen 5-325 MG tablet Commonly known as:  PERCOCET   polyethylene glycol packet Commonly known as:  MIRALAX / GLYCOLAX   sennosides-docusate sodium 8.6-50 MG tablet Commonly known as:  SENOKOT-S     TAKE these medications   albuterol 108 (90 Base) MCG/ACT inhaler Commonly known as:  PROVENTIL HFA;VENTOLIN HFA Inhale 2 puffs into the lungs every 6 (six) hours as needed for wheezing or shortness of breath.   aspirin 325 MG EC tablet Take 1 tablet (325 mg total) by mouth daily. Start taking on:  06/29/2017 What changed:    medication strength  how much to take   atorvastatin 20 MG tablet Commonly known as:  LIPITOR TAKE 1 TABLET(20 MG) BY MOUTH DAILY   carbidopa-levodopa 25-250 MG tablet Commonly known as:  SINEMET IR Take 2 tablets by mouth 4 (four) times daily.   carvedilol 3.125 MG tablet Commonly known as:  COREG Take 3.125 mg by mouth 2 (two) times daily with a meal.   clopidogrel 75 MG tablet Commonly known as:  PLAVIX Take 75 mg by mouth daily.   colchicine 0.6 MG tablet Take 1 tablet (0.6 mg total) by mouth 2 (two) times daily.   EPINEPHrine 0.3 mg/0.3 mL Soaj injection Commonly known as:  EPI-PEN Inject 0.3 mLs (0.3 mg total) into the muscle as needed (allergic reaction).    feeding supplement (ENSURE ENLIVE) Liqd Take 237 mLs by mouth 3 (three) times daily between meals.   furosemide 20 MG tablet Commonly known as:  LASIX Take 2 tablets (40 mg total) by mouth daily.   guaiFENesin-dextromethorphan 100-10 MG/5ML syrup Commonly known as:  ROBITUSSIN DM Take 5 mLs by mouth every 4 (four) hours as needed for cough.   hydrocortisone 10  MG tablet Commonly known as:  CORTEF 2 tablets in the morning and 1 tablet at 5 PM. Please start taking on 06/18/17 What changed:    how much to take  how to take this  when to take this  additional instructions   levothyroxine 112 MCG tablet Commonly known as:  SYNTHROID, LEVOTHROID TAKE 2 TABLETS(224 MCG) BY MOUTH DAILY BEFORE BREAKFAST What changed:  See the new instructions.   loratadine 10 MG tablet Commonly known as:  CLARITIN Take 1 tablet (10 mg total) by mouth daily.   losartan 100 MG tablet Commonly known as:  COZAAR TAKE 1 TABLET(100 MG) BY MOUTH DAILY   mometasone-formoterol 200-5 MCG/ACT Aero Commonly known as:  DULERA Inhale 2 puffs into the lungs 2 (two) times daily.   multivitamin with minerals Tabs tablet Take 1 tablet by mouth daily.   sertraline 100 MG tablet Commonly known as:  ZOLOFT Take 100 mg by mouth daily.   sildenafil 20 MG tablet Commonly known as:  REVATIO Take 20 mg by mouth.   testosterone cypionate 200 MG/ML injection Commonly known as:  DEPO-TESTOSTERONE Inject 0.75 mLs (150 mg total) into the muscle every 14 (fourteen) days.   ULORIC 40 MG tablet Generic drug:  febuxostat Take 40 mg by mouth daily.            Durable Medical Equipment  (From admission, onward)        Start     Ordered   06/28/17 1604  For home use only DME 3 n 1  Once     06/28/17 1603      Discharge Instructions: Please refer to Patient Instructions section of EMR for full details.  Patient was counseled important signs and symptoms that should prompt return to medical care, changes  in medications, dietary instructions, activity restrictions, and follow up appointments.   Follow-Up Appointments: Follow-up Information    Vladimir Crofts, MD. Go on 07/06/2017.   Specialty:  Neurology Contact information: [name] [address] [phone]           Nuala Alpha, DO 06/29/2017, 1:11 AM PGY-1, [place]

## 2017-06-28 NOTE — Evaluation (Signed)
Occupational Therapy Evaluation Patient Details Name: Dennis Vanblarcom Principato Sr. MRN: [id number] DOB: 07-02-1946 Today's Date: 06/28/2017    History of Present Illness 71 y.o.malewith PMH of recurrentCVA, chronic back pain, CHF, coronary artery disease, hypothyroidism, hypercholesterolemia, hypertension, bladder cancer,recently diagnosed Parkinson's disease. Pt presented with dizziness, slurred speech, SOB, and chest pain. CT and MRI showed L subacute lacunar infarct.   Clinical Impression   Pt reports he required min assist with ADL at SNF PTA. Currently pt overall min guard for functional mobility and min assist for LB ADL. Pt planning to d/c home with 24/7 supervision from family. Pt reports HH therapies already set up to come out to house next week; agree with [facility] for follow up to maximize independence and safety with ADL and functional mobility upon return home. Pt would benefit from continued skilled OT to address established goals.    Follow Up Recommendations  Home health OT    Equipment Recommendations  None recommended by OT    Recommendations for Other Services       Precautions / Restrictions Precautions Precautions: Fall Restrictions Weight Bearing Restrictions: No      Mobility Bed Mobility Overal bed mobility: Needs Assistance Bed Mobility: Supine to Sit;Sit to Supine     Supine to sit: Supervision Sit to supine: Supervision   General bed mobility comments: for safety, no physical assist required. Increased time and effort with use of bed rails  Transfers Overall transfer level: Needs assistance Equipment used: None Transfers: Sit to/from Stand Sit to Stand: Min guard         General transfer comment: for safety, no unsteadiness or LOB. No c/o dizziness    Balance Overall balance assessment: Needs assistance Sitting-balance support: Feet supported;No upper extremity supported Sitting balance-Leahy Scale: Good     Standing balance support: No  upper extremity supported;During functional activity Standing balance-Leahy Scale: Fair                             ADL either performed or assessed with clinical judgement   ADL Overall ADL's : Needs assistance/impaired Eating/Feeding: Set up;Sitting   Grooming: Set up;Supervision/safety;Sitting   Upper Body Bathing: Set up;Supervision/ safety;Sitting   Lower Body Bathing: Minimal assistance;Sit to/from stand   Upper Body Dressing : Set up;Supervision/safety;Sitting   Lower Body Dressing: Minimal assistance;Sit to/from stand   Toilet Transfer: Min guard;Ambulation Toilet Transfer Details (indicate cue type and reason): simulated by sit to stand from EOB with functional mobility in room         Functional mobility during ADLs: Min guard General ADL Comments: Discussed HH therapies for follow up--pt is agreeable     Vision Baseline Vision/History: Wears glasses Wears Glasses: At all times Patient Visual Report: Other (comment)(intermittent blurred vision, not present on eval) Vision Assessment?: No apparent visual deficits     Perception     Praxis      Pertinent Vitals/Pain Pain Assessment: No/denies pain     Hand Dominance Left   Extremity/Trunk Assessment Upper Extremity Assessment Upper Extremity Assessment: Overall WFL for tasks assessed(bil tremor)   Lower Extremity Assessment Lower Extremity Assessment: Defer to PT evaluation   Cervical / Trunk Assessment Cervical / Trunk Assessment: Other exceptions Cervical / Trunk Exceptions: hx of multiple back surgeries   Communication Communication Communication: No difficulties   Cognition Arousal/Alertness: Awake/alert Behavior During Therapy: WFL for tasks assessed/performed Overall Cognitive Status: Within Functional Limits for tasks assessed  General Comments       Exercises     Shoulder Instructions      Home Living Family/patient  expects to be discharged to:: Private residence Living Arrangements: Spouse/significant other;Children Available Help at Discharge: Family;Available 24 hours/day Type of Home: House Home Access: Ramped entrance     Home Layout: Two level;Able to live on main level with bedroom/bathroom     Bathroom Shower/Tub: Occupational psychologist: Handicapped height     Home Equipment: [place] - single point;Shower seat;Grab bars - tub/shower;Electric scooter          Prior Functioning/Environment Level of Independence: Needs assistance  Gait / Transfers Assistance Needed: ambulating with RW at SNF ADL's / Homemaking Assistance Needed: min assist with ADL at SNF            OT Problem List: Decreased activity tolerance;Impaired balance (sitting and/or standing);Obesity      OT Treatment/Interventions: Self-care/ADL training;Therapeutic exercise;Energy conservation;DME and/or AE instruction;Therapeutic activities;Patient/family education;Balance training    OT Goals(Current goals can be found in the care plan section) Acute Rehab OT Goals Patient Stated Goal: go home OT Goal Formulation: With patient Time For Goal Achievement: 07/12/17 Potential to Achieve Goals: Good  OT Frequency: Min 2X/week   Barriers to D/C:            Co-evaluation              AM-PAC PT "6 Clicks" Daily Activity     Outcome Measure Help from another person eating meals?: None Help from another person taking care of personal grooming?: A Little Help from another person toileting, which includes using toliet, bedpan, or urinal?: A Little Help from another person bathing (including washing, rinsing, drying)?: A Little Help from another person to put on and taking off regular upper body clothing?: A Little Help from another person to put on and taking off regular lower body clothing?: A Little 6 Click Score: 19   End of Session    Activity Tolerance: Patient tolerated treatment  well Patient left: in bed;with call bell/phone within reach  OT Visit Diagnosis: Unsteadiness on feet (R26.81)                Time: 1125-1140 OT Time Calculation (min): 15 min Charges:  OT General Charges $OT Visit: 1 Visit OT Evaluation $OT Eval Moderate Complexity: 1 Mod G-Codes:     Brynn Reznik A. Ulice Brilliant, M.S., OTR/L Pager: [place] 06/28/2017, 11:49 AM

## 2017-06-28 NOTE — Discharge Instructions (Signed)
You were admitted to the hospital because you suffered from an acute stroke. Your symptoms have resolved but you still require follow up with neurology. Please ensure you keep your appointment with them on 12/27 to be seen and evaluated.

## 2017-06-28 NOTE — Telephone Encounter (Signed)
Spoke w/ pt and requested that he send a manual transmission b/c his home monitor has not updated in at least 14 days.   

## 2017-06-28 NOTE — Progress Notes (Signed)
Family Medicine Teaching Service Daily Progress Note Intern Pager: [phone]  Patient name: Dagoberto Nealy Sr. Medical record number: [id number] Date of birth: May 20, 1946 Age: 71 y.o. Gender: male  Primary Care Provider: Mar Daring, PA-C Consultants: Neurology Code Status: Full  Pt Overview and Major Events to Date:  Mr. Jerone Cudmore Godby Sr. is a 71 y.o. male with PMH of recurrentCVA, chronic back pain, CHF, coronary artery disease, hypothyroidism, hypercholesterolemia, hypertension, bladder cancer,recently diagnosed Parkinson's disease  CT and MRI showed left subacute lacunar infarct.  Assessment and Plan:  CVA: Patient is getting transferred to our service today from [facility]. He presented with dizziness, slurred speech, SOB, and chest pain. The entire episode lasted about two hours and resolved by the time he got to the ED. He received an MRI which showed 39mm acute ischemic nonhemorrhagic left caudate lacunar infarct. Neurology has followed and signed off. HbgA1c 5.5, Lipid panel at goal except HDL low at 35 - Follow up with Dr. Manuella Ghazi at [facility] on 12/27 - Continue Aspirin and Plavix - Cont Atorvastatin 20mg  daily - PT and OT pending recs  Chest Pain: Resolved. No chest pain this am. Patient had substernal chest pain along with this episode that was described as if "an elephant is sitting on my chest". EKG showed NSR with abnormal R-wave progression and left axis deviation. Troponin was negative.  - No further work up indicated at this time - consider repeat troponin an EKG if chest pain returns  HTN: Chronic. Normotensive this am. Permissive HTN for 48hours per Neurology. - Restart home meds after 48hours (Coreg and Cozaar)  HFpEF: Patient has no symptoms of SOB, Orthopnea. Echocardiogram showed 60-65% LVEF wall motion normal.  - Restart Coreg and Cozaar and Lasix  Addison disease: Chronic. Stable. - Cont home med Cortef (hydrocortisone)  10mg   OSA: Patient on CPAP at [facility]. - cont CPAP.  Hypothyroid: Chronic. Stable. - cont levothyroxine home dose of 147mcg, 2 in the morning and 1 at 5pm  Parkinson: New. Stable. Patient states - cont Sinement 2 tabs four times per day  FEN/GI: heart healthy diet PPx: SCDs  Disposition: home  Subjective:  Patient states he is feeling well today. Denies chest pain, difficulty breathing, SOB, abdominal pain. He states he would like to go home today if possible. He no longer has any slurred speech, no confusion, no dizziness or instability on walking.   Objective: Temp:  [97.7 F (36.5 C)-98.3 F (36.8 C)] 98.2 F (36.8 C) (12/19 0446) Pulse Rate:  [68-89] 83 (12/19 0446) Resp:  [15-29] 18 (12/19 0446) BP: (118-147)/(64-89) 131/74 (12/19 0446) SpO2:  [92 %-97 %] 96 % (12/19 0446) Physical Exam: General: A&O x 3, NAD Cardiovascular: RRR, normal S1, S2, no murmurs Respiratory: CTAB, no wheezing, no crackles Abdomen: soft, obese, non-distended, non-tender, +bs Extremities: SCDs in place, no edema, +2 pedal pulses  Laboratory: Recent Labs  Lab 06/26/17 1816  WBC 8.4  HGB 14.6  HCT 46.5  PLT 159   Recent Labs  Lab 06/26/17 1816  NA 137  K 3.8  CL 99*  CO2 28  BUN 16  CREATININE 1.21  CALCIUM 9.1  GLUCOSE 101*   12/18 - Troponin: <0.03 x 3 12/18 - Lipid: Chol 117, Trigly 105, HDL 35, LDL 61 12/18 - HgbA1c: 5.5  Imaging/Diagnostic Tests: Ct Angio Head and neck W Or Wo Contrast Result Date: 06/27/2017 IMPRESSION: 1. No large vessel occlusion. 2. Unchanged 50% distal left common carotid artery stenosis. 3.  Dominant left vertebral artery which is widely patent. 4. Intracranial atherosclerosis without flow limiting proximal stenosis.  Dg Chest 2 View Result Date: 06/26/2017 CLINICAL DATA:  Chest pain EXAM: CHEST  2 VIEW COMPARISON:  05/27/2017 FINDINGS: The heart size and mediastinal contours are within normal limits. Cardiac loop recorder unchanged. Both lungs  are clear. The visualized skeletal structures are unremarkable. IMPRESSION: No active cardiopulmonary disease.  Ct Head Wo Contrast Result Date: 06/26/2017 IMPRESSION: 1. Age indeterminate hypodensity/possible lacunar infarct in the region of the left caudate nucleus. This is new compared with 11/15/2016. Negative for hemorrhage or intracranial mass or significant mass effect 2. Atrophy with small vessel ischemic changes of the white matter.  [name] Head Wo Contrast Result Date: 06/27/2017 IMPRESSION: Intracranial medium vessel atherosclerotic change, similar to the study of 11/15/2016. No new disease or evidence of definable interval vascular occlusion.  Mr Brain Wo Contrast Result Date: 06/27/2017 IMPRESSION: 1. 15 mm acute ischemic nonhemorrhagic left caudate lacunar infarct. 2. Atrophy with chronic microvascular ischemic disease, stable. 3.  Ct Angio Chest/abd/pel For Dissection W And/or Wo Contrast Result Date: 06/26/2017  IMPRESSION: 1. Mild thoracoabdominal aortic atherosclerosis without acute aortic abnormality or aneurysm. There are coronary artery calcifications. 2. No acute abnormality in the chest, abdomen, or pelvis.    Nuala Alpha, DO 06/28/2017, 6:58 AM PGY-1, [place] Intern pager: [phone], text pages welcome

## 2017-06-28 NOTE — Evaluation (Signed)
Physical Therapy Evaluation Patient Details Name: Dennis Nuon Lagerquist Sr. MRN: [id number] DOB: 1945-11-08 Today's Date: 06/28/2017   History of Present Illness  71 y.o.malewith PMH of recurrentCVA, chronic back pain, CHF, coronary artery disease, hypothyroidism, hypercholesterolemia, hypertension, bladder cancer,recently diagnosed Parkinson's disease. Pt presented with dizziness, slurred speech, SOB, and chest pain. CT and MRI showed L subacute lacunar infarct.  Clinical Impression  Pt admitted with above diagnosis. Pt currently with functional limitations due to the deficits listed below (see PT Problem List). Pt is limited in his safe mobility by decreased endurance and generalized weakness. Pt is currently supervision for bed mobility, min guard for transfers, and supervision for ambulation of 400 feet and ascent/descent of 10 steps. Pt was just discharged from SNF and set up with HHPT to start this week, PT feels this continues to be a good recommendation. Pt will benefit from skilled PT to increase their independence and safety with mobility to allow discharge to the venue listed below.       Follow Up Recommendations Home health PT;Supervision for mobility/OOB          Precautions / Restrictions Precautions Precautions: Fall Restrictions Weight Bearing Restrictions: No      Mobility  Bed Mobility Overal bed mobility: Needs Assistance Bed Mobility: Supine to Sit;Sit to Supine     Supine to sit: Supervision Sit to supine: Supervision   General bed mobility comments: for safety, no physical assist required. Increased time and effort with use of bed rails  Transfers Overall transfer level: Needs assistance Equipment used: None Transfers: Sit to/from Stand Sit to Stand: Min guard         General transfer comment: for safety, no unsteadiness or LOB. No c/o dizziness  Ambulation/Gait Ambulation/Gait assistance: Min guard;Supervision Ambulation Distance (Feet): 400  Feet Assistive device: None Gait Pattern/deviations: Step-through pattern;Decreased step length - right;Decreased step length - left Gait velocity: slowed Gait velocity interpretation: Below normal speed for age/gender General Gait Details: min guard progressing to supervision for safety, steady with ambulation, no LoB, no buckling, limited by activity tolerance  Stairs Stairs: Yes Stairs assistance: Supervision Stair Management: Two rails;Alternating pattern;Forwards Number of Stairs: 10 General stair comments: strong, steady ascent/descent, no assist      Balance Overall balance assessment: Needs assistance Sitting-balance support: Feet supported;No upper extremity supported Sitting balance-Leahy Scale: Good     Standing balance support: No upper extremity supported;During functional activity Standing balance-Leahy Scale: Fair                               Pertinent Vitals/Pain Pain Assessment: No/denies pain    Home Living Family/patient expects to be discharged to:: Private residence Living Arrangements: Spouse/significant other;Children Available Help at Discharge: Family;Available 24 hours/day Type of Home: House Home Access: Ramped entrance     Home Layout: Two level;Able to live on main level with bedroom/bathroom Home Equipment: Kasandra Knudsen - single point;Shower seat;Grab bars - tub/shower;Electric scooter      Prior Function Level of Independence: Needs assistance   Gait / Transfers Assistance Needed: ambulating with RW at SNF  ADL's / Homemaking Assistance Needed: min assist with ADL at SNF        Hand Dominance   Dominant Hand: Left    Extremity/Trunk Assessment   Upper Extremity Assessment Upper Extremity Assessment: Defer to OT evaluation    Lower Extremity Assessment Lower Extremity Assessment: Overall WFL for tasks assessed(bilateral tremor)    Cervical / Trunk Assessment  Cervical / Trunk Assessment: Other exceptions Cervical /  Trunk Exceptions: hx of multiple back surgeries  Communication   Communication: No difficulties  Cognition Arousal/Alertness: Awake/alert Behavior During Therapy: WFL for tasks assessed/performed Overall Cognitive Status: Within Functional Limits for tasks assessed                                        General Comments General comments (skin integrity, edema, etc.): VSS throughout session        Assessment/Plan    PT Assessment Patient needs continued PT services  PT Problem List Decreased activity tolerance;Decreased balance;Decreased mobility       PT Treatment Interventions DME instruction;Gait training;Stair training;Functional mobility training;Neuromuscular re-education;Balance training;Therapeutic exercise;Therapeutic activities;Patient/family education    PT Goals (Current goals can be found in the Care Plan section)  Acute Rehab PT Goals Patient Stated Goal: go home PT Goal Formulation: With patient Time For Goal Achievement: 06/27/17 Potential to Achieve Goals: Good    Frequency Min 3X/week    AM-PAC PT "6 Clicks" Daily Activity  Outcome Measure Difficulty turning over in bed (including adjusting bedclothes, sheets and blankets)?: A Little Difficulty moving from lying on back to sitting on the side of the bed? : A Little Difficulty sitting down on and standing up from a chair with arms (e.g., wheelchair, bedside commode, etc,.)?: A Little Help needed moving to and from a bed to chair (including a wheelchair)?: None Help needed walking in hospital room?: None Help needed climbing 3-5 steps with a railing? : None 6 Click Score: 21    End of Session Equipment Utilized During Treatment: Gait belt Activity Tolerance: Patient tolerated treatment well Patient left: in bed;with call bell/phone within reach Nurse Communication: Mobility status PT Visit Diagnosis: Muscle weakness (generalized) (M62.81);Other symptoms and signs involving the nervous  system (D40.814)    Time: 4818-5631 PT Time Calculation (min) (ACUTE ONLY): 23 min   Charges:   PT Evaluation $PT Eval Moderate Complexity: 1 Mod PT Treatments $Gait Training: 8-22 mins   PT G Codes:        Brihana Quickel B. Migdalia Dk PT, DPT Acute Rehabilitation  [phone] Pager [phone]    [place] 06/28/2017, 3:49 PM

## 2017-06-28 NOTE — Care Management Note (Signed)
Case Management Note  Patient Details  Name: Dennis Boateng Fleischhacker Sr. MRN: [id number] Date of Birth: 1945/11/19  Subjective/Objective:   Pt admitted with CVA. He was at [facility] SN[facility] prior to admission.                 Action/Plan: Awaiting PT/OT evals. CM following for d/c needs, physician orders.   Expected Discharge Date:                  Expected Discharge Plan:  Skilled Nursing Facility  In-House Referral:  Clinical Social Work  Discharge planning Services     Post Acute Care Choice:    Choice offered to:     DME Arranged:    DME Agency:     HH Arranged:    [place] Agency:     Status of Service:  In process, will continue to follow  If discussed at Long Length of Stay Meetings, dates discussed:    Additional Comments:  Pollie Friar, RN 06/28/2017, 10:39 AM

## 2017-06-28 NOTE — Care Management Note (Signed)
Case Management Note  Patient Details  Name: Dennis Antonini Blethen Sr. MRN: [id number] Date of Birth: May 26, 1946  Subjective/Objective                Action/Plan: Pt discharging home with [facility] services. CM spoke to the patient and his wife and they are already set up with [facility]. CM notified the branch of the resumption of care and faxed the orders: [phone]. Pt with orders for 3 in 1. Patient states he does not need this DME.  Wife providing transportation home.  Expected Discharge Date:  06/28/17               Expected Discharge Plan:  Skilled Nursing Facility  In-House Referral:  Clinical Social Work  Discharge planning Services  CM Consult  Post Acute Care Choice:  Durable Medical Equipment, Resumption of Svcs/PTA Provider(refused) Choice offered to:  Patient, Spouse  DME Arranged:    DME Agency:     HH Arranged:  PT, OT HH Agency:  [place]  Status of Service:  Completed, signed off  If discussed at [place] of Stay Meetings, dates discussed:    Additional Comments:  Pollie Friar, RN 06/28/2017, 5:45 PM

## 2017-06-29 ENCOUNTER — Telehealth: Payer: Self-pay | Admitting: Family Medicine

## 2017-06-29 ENCOUNTER — Other Ambulatory Visit: Payer: Self-pay | Admitting: *Deleted

## 2017-06-29 ENCOUNTER — Telehealth: Payer: Self-pay | Admitting: *Deleted

## 2017-06-29 DIAGNOSIS — I480 Paroxysmal atrial fibrillation: Secondary | ICD-10-CM

## 2017-06-29 NOTE — Patient Outreach (Signed)
[facility]) Care Management  06/29/2017  Dennis [name]. 1946-03-01 [id number]  Transition of care call     72 year old male with [facility] admission 11/27- 11/29, Dx: Cellulitis right lower extremity  12/3 Readmission to [facility] for Right leg cellulitis, D/C on 12/6 to [facility] for short term rehab,and IV Antibiotics. 12/17 Readmitted to [facility] cone with new CVA. 12/19 Discharged to Home, with home health services.  PMHx: includes bu not limited to , Parkinson's , obstructive sleep apena, Addison Disease, CVA, history of agent orange exposure.   Successful outreach for transition of care, HIPAA identifiers obtained.  Patient reports that he is feeling pretty good now, had been sleeping later today.  Patient discussed decreased appetite, and eating just to eat. Patient reports he is now sipping on some ensure throughout the day.  Patient discussed readmission no new stroke, speak clear, denies any new stroke symptoms states this was his 4th stroke. Patient denies chest pain.  Patient discussed phone call he received regarding evaluation of his loop recording showing episode of  atrial fib around the time he noted stroke symptoms, and cardiology to follow up with him about further recommendations.   Patient further reports.  Medications. Patient discussed that he was changed from allopurinal to Uloric while in [place] [facility] did not get a prescription because he returned to the hospital, so he has been taking allopurinal that he has at home.  Patient was recently discharged from hospital and all medications have been reviewed.  Patient reports having all other medications. Placed call to [employer] office to report need for Uloric prescription, spoke with representative Arbie Cookey that will follow up with PCP.   Follow up appointments Patient has follow up appointment to establish care with new PCP on 12/26 And on 12/27 with Dr.Shah, neurologist. Wife is able to  provide transportation.  [facility] patient and wife and arrangement for visit in the next week. Patient discussed  equipment of scooter, U walker, and building of ramp is in process by the [place], he has an appointment for evaluation related to this in January at [place] office. Patients' wife and son are available at this time to assist with   Outpatient Encounter Medications as of 06/29/2017  Medication Sig  . albuterol (PROVENTIL HFA;VENTOLIN HFA) 108 (90 Base) MCG/ACT inhaler Inhale 2 puffs into the lungs every 6 (six) hours as needed for wheezing or shortness of breath.  Marland Kitchen aspirin EC 325 MG EC tablet Take 1 tablet (325 mg total) by mouth daily.  Marland Kitchen atorvastatin (LIPITOR) 20 MG tablet TAKE 1 TABLET(20 MG) BY MOUTH DAILY  . carbidopa-levodopa (SINEMET IR) 25-250 MG tablet Take 2 tablets by mouth 4 (four) times daily.  . carvedilol (COREG) 3.125 MG tablet Take 3.125 mg by mouth 2 (two) times daily with a meal.  . clopidogrel (PLAVIX) 75 MG tablet Take 75 mg by mouth daily.  . colchicine 0.6 MG tablet Take 1 tablet (0.6 mg total) by mouth 2 (two) times daily.  Marland Kitchen EPINEPHrine 0.3 mg/0.3 mL IJ SOAJ injection Inject 0.3 mLs (0.3 mg total) into the muscle as needed (allergic reaction).  . feeding supplement, ENSURE ENLIVE, (ENSURE ENLIVE) LIQD Take 237 mLs by mouth 3 (three) times daily between meals.  . furosemide (LASIX) 20 MG tablet Take 2 tablets (40 mg total) by mouth daily.  Marland Kitchen guaiFENesin-dextromethorphan (ROBITUSSIN DM) 100-10 MG/5ML syrup Take 5 mLs by mouth every 4 (four) hours as needed for  cough.  . hydrocortisone (CORTEF) 10 MG tablet 2 tablets in the morning and 1 tablet at 5 PM. Please start taking on 06/18/17 (Patient taking differently: Take 20 mg by mouth daily. 2 tablets in the morning and 1 tablet at 5 PM. Please start taking on 06/18/17)  . levothyroxine (SYNTHROID, LEVOTHROID) 112 MCG tablet TAKE 2 TABLETS(224 MCG) BY MOUTH DAILY BEFORE BREAKFAST  (Patient taking differently: TAKE 2 TABLETS BY MOUTH EVERY MORNING AND 1 TABLET BY MOUTH EVERY EVENING)  . loratadine (CLARITIN) 10 MG tablet Take 1 tablet (10 mg total) by mouth daily.  Marland Kitchen losartan (COZAAR) 100 MG tablet TAKE 1 TABLET(100 MG) BY MOUTH DAILY  . Multiple Vitamin (MULTIVITAMIN WITH MINERALS) TABS tablet Take 1 tablet by mouth daily.  . sertraline (ZOLOFT) 100 MG tablet Take 100 mg by mouth daily.  Marland Kitchen testosterone cypionate (DEPO-TESTOSTERONE) 200 MG/ML injection Inject 0.75 mLs (150 mg total) into the muscle every 14 (fourteen) days.  . febuxostat (ULORIC) 40 MG tablet Take 40 mg by mouth daily.  . mometasone-formoterol (DULERA) 200-5 MCG/ACT AERO Inhale 2 puffs into the lungs 2 (two) times daily. (Patient not taking: Reported on 06/12/2017)  . sildenafil (REVATIO) 20 MG tablet Take 20 mg by mouth.   No facility-administered encounter medications on file as of 06/29/2017.     Plan Will continue weekly transition of care outreaches, next call in a week and will plan initial home visit appointment.   [facility] CM Care Plan Problem One     Most Recent Value  Care Plan Problem One  At risk for readmission,related to recent admission with cellulits   Role Documenting the Problem One  Care Management [place] for Problem One  Active  [facility] Long Term Goal   Patient will not experience a hospital admission in the next 31 days   [place] Term Goal Start Date  06/29/17 [date restarted, after readmission ]  Interventions for Problem One Long Term Goal  Reviewed discharge instructions, discussed symptoms of recurrent stroke and plan for immediate medical attention .    [facility] CM Short Term Goal #1   Patient will attend all medical appointment in the next 30 days   [facility] CM Short Term Goal #1 Start Date  06/09/17 Sudie Grumbling restart ]  Interventions for Short Term Goal #1  Reviewed current visits with neurologist ,new PCP ,   [facility] CM Short Term Goal #2   Patient will be able to report improvement  in cellulits symptoms in the next 30 days   [facility] CM Short Term Goal #2 Start Date  06/09/17  Interventions for Short Term Goal #2  Reviwed current clinical state, improved, review current symptoms of worsening redness, swelling , temperature change to notify MD of   [facility] CM Short Term Goal #3  Patient will report increased strenght in the next 30 days   [facility] CM Short Term Goal #3 Start Date  06/29/17  Interventions for Short Tern Goal #3  Discussed current home therapy plans, verified home health has contacted patient,      Joylene Draft, RN, [place] Management Coordinator  [phone]- Mobile [phone]- [place]

## 2017-06-29 NOTE — Telephone Encounter (Signed)
Spoke with patient regarding sending a manual transmission to review Full report of AF episodes noted on LINQ. Manual transmission sent successfully. Advised patient will review with [name] when he is in the office tomorrow and give the patient a call back with further recommendations. Patient verbalized understanding.

## 2017-06-29 NOTE — Telephone Encounter (Signed)
[name] with [facility] called to state that Mr. Wegner went [facility] and now discharged home.   While he was at [facility], his allopurinol was d/c and given Uloric 40 mg. But he does not have a prescription for this and needs it called into [facility] on [address]. Please Maudie Mercury know when it is sent in  [phone]

## 2017-06-29 NOTE — Patient Outreach (Signed)
[facility]) Care Management  [facility] Social Work  06/29/2017  Dennis Hosea Hodes Sr. Jul 01, 1946 [id number]  Subjective:  "He is home and sleeping".   Objective: [facility] CSW to assist patient and family with community based resources to aide in their well-being, quality of life and overall safety and needs.    Encounter Medications:  Outpatient Encounter Medications as of 06/29/2017  Medication Sig Note  . albuterol (PROVENTIL HFA;VENTOLIN HFA) 108 (90 Base) MCG/ACT inhaler Inhale 2 puffs into the lungs every 6 (six) hours as needed for wheezing or shortness of breath.   Marland Kitchen aspirin EC 325 MG EC tablet Take 1 tablet (325 mg total) by mouth daily.   Marland Kitchen atorvastatin (LIPITOR) 20 MG tablet TAKE 1 TABLET(20 MG) BY MOUTH DAILY   . carbidopa-levodopa (SINEMET IR) 25-250 MG tablet Take 2 tablets by mouth 4 (four) times daily. 06/27/2017: 9am, 1pm, 5pm, 9pm  . carvedilol (COREG) 3.125 MG tablet Take 3.125 mg by mouth 2 (two) times daily with a meal.   . clopidogrel (PLAVIX) 75 MG tablet Take 75 mg by mouth daily.   . colchicine 0.6 MG tablet Take 1 tablet (0.6 mg total) by mouth 2 (two) times daily.   Marland Kitchen EPINEPHrine 0.3 mg/0.3 mL IJ SOAJ injection Inject 0.3 mLs (0.3 mg total) into the muscle as needed (allergic reaction). 06/09/2017: Has on hand   . febuxostat (ULORIC) 40 MG tablet Take 40 mg by mouth daily.   . feeding supplement, ENSURE ENLIVE, (ENSURE ENLIVE) LIQD Take 237 mLs by mouth 3 (three) times daily between meals.   . furosemide (LASIX) 20 MG tablet Take 2 tablets (40 mg total) by mouth daily. 06/09/2017: Reports taking 40 mg twice daily  . guaiFENesin-dextromethorphan (ROBITUSSIN DM) 100-10 MG/5ML syrup Take 5 mLs by mouth every 4 (four) hours as needed for cough.   . hydrocortisone (CORTEF) 10 MG tablet 2 tablets in the morning and 1 tablet at 5 PM. Please start taking on 06/18/17 (Patient taking differently: Take 20 mg by mouth daily. 2 tablets in the morning and 1 tablet at 5  PM. Please start taking on 06/18/17)   . levothyroxine (SYNTHROID, LEVOTHROID) 112 MCG tablet TAKE 2 TABLETS(224 MCG) BY MOUTH DAILY BEFORE BREAKFAST (Patient taking differently: TAKE 2 TABLETS BY MOUTH EVERY MORNING AND 1 TABLET BY MOUTH EVERY EVENING)   . loratadine (CLARITIN) 10 MG tablet Take 1 tablet (10 mg total) by mouth daily.   Marland Kitchen losartan (COZAAR) 100 MG tablet TAKE 1 TABLET(100 MG) BY MOUTH DAILY   . mometasone-formoterol (DULERA) 200-5 MCG/ACT AERO Inhale 2 puffs into the lungs 2 (two) times daily. (Patient not taking: Reported on 06/12/2017)   . Multiple Vitamin (MULTIVITAMIN WITH MINERALS) TABS tablet Take 1 tablet by mouth daily.   . sertraline (ZOLOFT) 100 MG tablet Take 100 mg by mouth daily.   . sildenafil (REVATIO) 20 MG tablet Take 20 mg by mouth.   . testosterone cypionate (DEPO-TESTOSTERONE) 200 MG/ML injection Inject 0.75 mLs (150 mg total) into the muscle every 14 (fourteen) days. 06/26/2017: Due 06/28/2017   No facility-administered encounter medications on file as of 06/29/2017.     Functional Status:  In your present state of health, do you have any difficulty performing the following activities: 06/27/2017 06/12/2017  Hearing? - N  Vision? - N  Difficulty concentrating or making decisions? - N  Walking or climbing stairs? Y Y  Comment - -  Dressing or bathing? - Y  Doing errands, shopping? - Y  Preparing Food  and eating ? - -  Using the Toilet? - -  In the past six months, have you accidently leaked urine? - -  Comment - -  Do you have problems with loss of bowel control? - -  Managing your Medications? - -  Managing your Finances? - -  Housekeeping or managing your Housekeeping? - -  Some recent data might be hidden    Fall/Depression Screening:  PHQ 2/9 Scores 10/05/2016  PHQ - 2 Score 0    Assessment:   CSW spoke with wife via phone who reports he is home and sleeping. "after he a stroke he usually sleeps for a few days".  She reports he was cleared  while at hospital to dc home and not return to SNF. Home Health therapy and nursing were arranged per wife and he is weak so she is eager to keep the therapy going.  They plan follow up at [facility] for DME (specialty items including wheelchair, etc). Patient's wife and son are in the home and overseeing his care and are "making do" Patient's wife would like to continue with [facility] support and feels they can use "all the eyes and ears" they can get.  CSW will place order for [facility] RNCM and RPH to assist with assessing needs and for support.    Plan:  CSW plans follow up call next week for follow up and to offer home visit.   [facility] CM Care Plan Problem One     Most Recent Value  Care Plan Problem One  Admitted to SNF for rehab.  Role Documenting the Problem One  Clinical Social Worker  Care Plan for Problem One  Active  [facility] CM Short Term Goal #1   Patient will participate with therapy and goals at SNF in the next 30 days.  [facility] CM Short Term Goal #1 Start Date  06/22/17  [facility] CM Short Term Goal #1 Met Date  06/29/17  Interventions for Short Term Goal #1  CSW discussed and encouraged patient regarding rehab goals.  [facility] CM Short Term Goal #2   Patient will be released from SNF to home with adequate support and services arranged in the next 30 days.   [facility] CM Short Term Goal #2 Start Date  06/22/17  [facility] CM Short Term Goal #2 Met Date  06/29/17  Interventions for Short Term Goal #2  CSW will discuss and assist with coordination of dc plans and needs via patient and family and SNF staff.     [facility] CM Care Plan Problem Two     Most Recent Value  Care Plan Problem Two  Patient released to home from SNF with limitations and needs.  Role Documenting the Problem Two  Clinical Social Worker  Care Plan for Problem Two  Active  [facility] CM Short Term Goal #1   Patient and wife will be connected with resources and support in the next 30 days.  [facility] CM Short Term Goal #1 Start Date  06/29/17  Interventions for Short Term Goal #2    CSW will complete assessment and determine goals post SNF via home visit.        Eduard Clos, MSW, [place] Worker  [place] [phone]

## 2017-06-30 ENCOUNTER — Other Ambulatory Visit: Payer: Self-pay | Admitting: Family Medicine

## 2017-06-30 DIAGNOSIS — E790 Hyperuricemia without signs of inflammatory arthritis and tophaceous disease: Secondary | ICD-10-CM

## 2017-06-30 DIAGNOSIS — I480 Paroxysmal atrial fibrillation: Secondary | ICD-10-CM | POA: Insufficient documentation

## 2017-06-30 MED ORDER — APIXABAN 5 MG PO TABS: 5.0000 mg | ORAL_TABLET | Freq: Two times a day (BID) | ORAL | 11 refills | Status: DC

## 2017-06-30 MED ORDER — FEBUXOSTAT 40 MG PO TABS: 40.0000 mg | ORAL_TABLET | Freq: Every day | ORAL | 3 refills | Status: DC

## 2017-06-30 NOTE — Telephone Encounter (Addendum)
Per [name], ECGs show probable A-fib.  Plan to D/C Plavix and ASA and start Eliquis 5mg  BID.  Patient made aware of Dr. Tanna Furry recommendations to STOP ASA and Plavix and to START Eliquis 5mg  BID for new A-fib.  Patient states he will be unable to make it in to the office in the next few days for samples/30-day card, so he declines both at this time.  Prescription for Eliquis 5mg  BID sent in to patient's preferred pharmacy as a 30-day supply per his request.  Patient aware to monitor for signs/symptoms of bleeding, and to call if he has any questions or concerns.  Patient is agreeable to 4wk f/u with Roderic Palau, NP in the [facility] on 08/01/17 at 11:30am.  He is aware he will need labs this day.  Patient given office address and parking code.  Patient verbalizes understanding of all instructions.  3-38m recall entered for [name].  [facility] parking/appointment instructions mailed to patient.

## 2017-06-30 NOTE — Telephone Encounter (Signed)
Will send Uloric 40 mg qd #30 to [facility] [address] Don't use the Allopurinol at the same time as using the Uloric. Take prescription bottle with him to the appointment at [facility] on 07-05-17.

## 2017-06-30 NOTE — Telephone Encounter (Signed)
[name] advised on her personal voicemail-Jaimen Melone Estell Harpin, RMA
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
## 2017-07-03 ENCOUNTER — Other Ambulatory Visit: Payer: Self-pay | Admitting: Internal Medicine

## 2017-07-05 ENCOUNTER — Encounter: Payer: Self-pay | Admitting: Family Medicine

## 2017-07-05 ENCOUNTER — Ambulatory Visit (INDEPENDENT_AMBULATORY_CARE_PROVIDER_SITE_OTHER): Payer: Medicare Other | Admitting: Family Medicine

## 2017-07-05 VITALS — BP 128/74 | HR 88 | Temp 97.6°F | Ht 77.0 in | Wt 364.2 lb

## 2017-07-05 DIAGNOSIS — I639 Cerebral infarction, unspecified: Secondary | ICD-10-CM

## 2017-07-05 DIAGNOSIS — I4891 Unspecified atrial fibrillation: Secondary | ICD-10-CM

## 2017-07-05 DIAGNOSIS — I6340 Cerebral infarction due to embolism of unspecified cerebral artery: Secondary | ICD-10-CM

## 2017-07-05 DIAGNOSIS — R2689 Other abnormalities of gait and mobility: Secondary | ICD-10-CM | POA: Diagnosis not present

## 2017-07-05 DIAGNOSIS — E271 Primary adrenocortical insufficiency: Secondary | ICD-10-CM

## 2017-07-05 DIAGNOSIS — Z7689 Persons encountering health services in other specified circumstances: Secondary | ICD-10-CM | POA: Diagnosis not present

## 2017-07-05 DIAGNOSIS — G20 Parkinson's disease: Secondary | ICD-10-CM

## 2017-07-05 DIAGNOSIS — L03115 Cellulitis of right lower limb: Secondary | ICD-10-CM

## 2017-07-05 MED ORDER — SERTRALINE HCL 100 MG PO TABS: ORAL_TABLET | ORAL | 3 refills | Status: DC

## 2017-07-05 NOTE — Progress Notes (Signed)
Subjective:    Patient ID: Dennis Fountain Sr., male    DOB: [birth year], 71 y.o.   MRN: [id number]  HPI This is a 71 yo male who presents today to establish care. He is married and accompanied by his wife today. His son also lives with them and helps with patient's care.   He was recently admitted to [facility]- 11/27-11/29/18 with diagnosis of cellulitis of RLE. He required readmission to [facility] 12/3-12/6/18 for right leg cellulitis. He was discharged to [facility] for continued antibiotics and skilled nursing/therapy. He was readmitted to [facility] 12/17- 06/28/17 with new CVA. He was discharged home with home health services and [facility] care management. ON 06/28/17, his pacemaker was interrogated remotely and he was found to be in Afib. [name] (cardiology) stopped his aspirin and plavix and put him on Elaquis.   He has follow up with neurology, cardiology, endocrinology and urology.   Today he reports that he is gradually getting his strength back. He denies chest pain, SOB, palpitations, abdominal pain, nausea/vomiting, headache, bleeding, recent falls. His wife is working to adapt their home to make it more accessible. Their son is able to help patient with stairs and they are working get a ramp installed. His goal is to become adequately mobile to go to his father in [place] in the spring. He would need to be able to climb 21 steps to get into the house.   Cellulitis- swelling and redness have  improved, his wife produces pictures of red, edematous leg/foot. Patient concerned about ongoing discoloration of lower extremities. No recent fevers.   He has a history of gout and was recently taken off his allopurinol and started on Uloric which was unaffordable.    Past Medical History:  Diagnosis Date  . Acute CVA (cerebrovascular accident) ([place]) 01/22/2016  . Addison disease ([place])   . Addison disease ([place]) 01/2016  . Addison's disease ([place]) 01/2016  . Arthritis    low back - DDD  . Cancer  [facility])    Testicular Cancer  . Cellulitis of scrotum   . Cellulitis, scrotum 08/02/2014  . CHF (congestive heart failure) ([place])   . Chronic lower back pain    "from [place] 2007"  . Complication of anesthesia    Sometimes has N&V /w anesth.   . Coronary artery disease    Cath 2001  . Elevated PSA   . Epididymitis, left 08/04/2014  . History of chronic bronchitis   . History of gout   . Hypertension   . Hypocholesteremia   . Hypothyroidism   . Kidney stone   . Myocardial infarction [facility]) 2001   2001- cardiac cath., cardiac clearanece note dr Otho Perl 05-14-13 on chart, stress test results 02-21-12 on chart  . OSA on CPAP    cpap setting of 10  . Pneumonia 2000's and 2013  . PONV (postoperative nausea and vomiting)   . [facility] spotted fever   . Stroke [facility]) 2004   "right brain stem; no residual "      Review of Systems  Constitutional: Positive for fatigue. Negative for fever and unexpected weight change.  Respiratory: Negative for cough, chest tightness, shortness of breath and wheezing.   Cardiovascular: Negative for chest pain, palpitations and leg swelling.  Gastrointestinal: Negative for abdominal pain, blood in stool, constipation, diarrhea, nausea and vomiting.  Genitourinary: Positive for frequency (chronic). Negative for dysuria and hematuria.  Musculoskeletal: Positive for back pain (chronic).  Skin: Positive for color change (both lower  legs, worse after cellulitis).  Neurological: Positive for tremors (chronic from parkinson's). Negative for speech difficulty, numbness and headaches.  Hematological: Does not bruise/bleed easily.  Psychiatric/Behavioral: Negative for sleep disturbance (uses CPAP).       Objective:   Physical Exam  Constitutional: He is oriented to person, place, and time. He appears well-developed and well-nourished. No distress.  HENT:  Head: Normocephalic and atraumatic.  Mouth/Throat: Oropharynx is clear and moist.  Eyes: Conjunctivae are  normal.  Cardiovascular: Normal rate, regular rhythm and normal heart sounds.  Pulmonary/Chest: Effort normal and breath sounds normal.  Musculoskeletal: He exhibits no edema.  Able to ambulate slowly with cane. Able to get on/off exam table with assistance.  UE/LE strength 5/5  Neurological: He is alert and oriented to person, place, and time.  Skin: Skin is warm and dry. He is not diaphoretic.  Chronic appearing, flesh colored, raised, rough areas on bilateral arms- he states these are from [employer] exposure and dermatology says there is nothing to do about them.  Bilateral lower extremities with chronic appearing hyperpigmentation.   Psychiatric: He has a normal mood and affect. His behavior is normal. Judgment and thought content normal.  Vitals reviewed.     BP 128/74   Pulse 88   Temp 97.6 F (36.4 C) (Oral)   Ht 6\' 5"  (1.956 m)   Wt (!) 364 lb 4 oz (165.2 kg)   SpO2 96%   BMI 43.19 kg/m  Wt Readings from Last 3 Encounters:  07/05/17 (!) 364 lb 4 oz (165.2 kg)  06/26/17 (!) 365 lb (165.6 kg)  06/15/17 (!) 365 lb 8.4 oz (165.8 kg)       Assessment & Plan:  1. Encounter to establish care - follow up in 2 months for CPE  2. Cellulitis of right lower extremity - erythema and swelling have resolved, discussed persistent chronic skin color changes and RTC/ER precautions  3. Parkinson's disease (tremor, stiffness, slow motion, unstable posture) (HCC) - continued follow up with neurology  4. Poor balance -Home  OT/PT scheduled and family working to make home safe and accessible  5. Cerebrovascular accident (CVA) due to embolism of cerebral artery ([place]) - reviewed RTC/ER precautions  6. Addison's disease ([place]) - follow up as scheduled with endocrine  7. Atrial fibrillation with controlled ventricular rate ([place]) - discussed increased risk of bleeding on Eliquis   Clarene Reamer, FNP-BC  [facility] [place]  [facility]  07/08/2017 8:52 AM

## 2017-07-05 NOTE — Patient Instructions (Signed)
Please follow up in 2 months for complete physical

## 2017-07-06 ENCOUNTER — Telehealth: Payer: Self-pay | Admitting: Pharmacist

## 2017-07-06 ENCOUNTER — Other Ambulatory Visit: Payer: Self-pay | Admitting: Family Medicine

## 2017-07-06 ENCOUNTER — Other Ambulatory Visit: Payer: Self-pay | Admitting: *Deleted

## 2017-07-06 DIAGNOSIS — M1A9XX1 Chronic gout, unspecified, with tophus (tophi): Secondary | ICD-10-CM

## 2017-07-06 MED ORDER — ALLOPURINOL 100 MG PO TABS: 100.0000 mg | ORAL_TABLET | Freq: Two times a day (BID) | ORAL | 1 refills | Status: DC

## 2017-07-06 NOTE — Patient Outreach (Signed)
Dennis [name]) Care Management  [place]   07/06/2017  Dennis Venard Huwe Sr. 03/30/1946 [id number]  71 y.o. year old male referred to [facility] for Medication Management (Pharmacy telephone follow up) and Transitions Of Care Received communication from [name] that patient needed clarification on meds since recent discharge from [facility] to home in setting of multiple care transitions and med changes.  PMH s/f: Coronary artery disease, diastolic CHF, stroke and TIA (multiple) + recent hospitalization for recurrent TIA/stroke 06/26/17-06/28/17, HTN, paroxysmal afib, adrenal insufficiency/addison disease, primary parkinsonism, arthritis, chronic tophaceous gout, acute on chronic kidney disease, chronic back pain, depression, hypercholesterolemia, morbid obesity, history of cellulitis + recent hospitalizations for cellulitis 06/06/17-06/08/17 and 06/12/17-06/15/17.  Patient with AARP Medication coverage  Subjective: Patient confirms identity with HIPAA-identifiers x2 and gives verbal consent to speak over the phone about medications. He reports that there has been confusion about a few of his medications 2/2 multiple transitions in care. Most recently, he was admitted to [facility] on 06/26/17 from [facility] for new stroke seen on CT, MRI, and MRA and chest pressure. At that time, patient was started on aspirin 325 mg daily and plavix was continued. He was discharged to home on 06/28/17. On 06/30/2017, he was advised by cardiologist ([name]) to d/c aspirin, plavix and start Eliquis 5 mg twice daily as "ECGs show probable A-fib". Patient aware of this and has been taking Eliquis, stopped taking aspirin and plavix. Denies any s/sx of bleeding and is well educated on s/sx of stroke.   He reports confusion on whether he should be taking allopurinol or Uloric. Patient reports Uloric was going to cost him ~$400/30 day supply and this was obviously  unaffordable. He reports that his allopurinol was switched to Uloric because of an interaction with his Sinemet while he was at [facility]. He states that he has been taking allopurinol 100 mg twice daily and colchicine 0.6 mg BID since he cannot afford Uloric. Of note, patient admits that his allopurinol was prescribed as 100 mg five times daily, but he has never taken it like that because "it's just too much". Denies s/sx of gout attack and reports unchanged tophi on L foot. States he has only had one gout attack since being on colchicine + allopurinol daily and it was when he had cellulitis.  [facility] to inquire about allopurinol switch to Uloric. Olen Cordial, RN unit supervisor reports that patient was NOT on allopurinol when he arrived at the facility. Upon chart review, it appears that allopurinol was discontinued inpatient during admission for cellulitis and it was planned to be restarted once flare resolved.   Patient states "I try to" when asked about daily weights. Reports weight loss of ~25lbs since diagnosis of Parkinson's disease. Attributes weight loss to loss of appetite and change in eating patterns. Denies chest pain, paroxysmal nocturnal dyspnea, orthopnea, lower extrimity edema, shortness of breath.  He has follow up scheduled:  1) Neurology TODAY with Dr. Manuella Ghazi for neurology  1) Neurology 07/12/2017 with Dr. Carles Collet for neurology (patient reports he may need to cancel this, the [place] set this appointment up for him) 2) Cardiology 08/01/17 with Roderic Palau, NP in [facility] 3) Endocrinology 08/04/2017 with Dr. Dwyane Dee 4) Primary care 09/06/2016 with Clarene Reamer, FNP   Objective:  Weight >60 kg, Age <80yo, Scr < 1.5 mg/dL CrCl =  81 ml/min using adjusted body weight and Scr from 06/12/2017  06/12/2017 Uric acid =  7.8 9on Allopurinol 100 mg BID + colchicine 0.6 mg BID) 06/27/17 LDL = 61  Encounter Medications: Outpatient Encounter Medications as of 07/06/2017   Medication Sig Note  . allopurinol (ZYLOPRIM) 100 MG tablet Take 100 mg by mouth 2 (two) times daily.    Marland Kitchen apixaban (ELIQUIS) 5 MG TABS tablet Take 1 tablet (5 mg total) by mouth 2 (two) times daily.   Marland Kitchen atorvastatin (LIPITOR) 20 MG tablet TAKE 1 TABLET(20 MG) BY MOUTH DAILY   . carbidopa-levodopa (SINEMET IR) 25-250 MG tablet Take 2 tablets by mouth 4 (four) times daily. 06/27/2017: 9am, 1pm, 5pm, 9pm  . carvedilol (COREG) 3.125 MG tablet Take 3.125 mg by mouth 2 (two) times daily with a meal.   . colchicine 0.6 MG tablet Take 1 tablet (0.6 mg total) by mouth 2 (two) times daily.   Marland Kitchen EPINEPHrine 0.3 mg/0.3 mL IJ SOAJ injection Inject 0.3 mLs (0.3 mg total) into the muscle as needed (allergic reaction). 06/09/2017: Has on hand   . feeding supplement, ENSURE ENLIVE, (ENSURE ENLIVE) LIQD Take 237 mLs by mouth 3 (three) times daily between meals.   . furosemide (LASIX) 20 MG tablet Take 2 tablets (40 mg total) by mouth daily. (Patient taking differently: Take 40 mg by mouth 2 (two) times daily. ) 06/09/2017: Reports taking 40 mg twice daily  . hydrocortisone (CORTEF) 10 MG tablet 2 tablets in the morning and 1 tablet at 5 PM. Please start taking on 06/18/17 (Patient taking differently: 2 tablets (20 mg) in the morning and 1 tablet (10 mg) in the afternoon)   . levothyroxine (SYNTHROID, LEVOTHROID) 112 MCG tablet TAKE 2 TABLETS(224 MCG) BY MOUTH DAILY BEFORE BREAKFAST (Patient taking differently: TAKE 2 TABLETS BY MOUTH EVERY MORNING AND 1 TABLET BY MOUTH EVERY EVENING)   . losartan (COZAAR) 100 MG tablet TAKE 1 TABLET(100 MG) BY MOUTH DAILY   . Multiple Vitamin (MULTIVITAMIN WITH MINERALS) TABS tablet Take 1 tablet by mouth daily.   . sertraline (ZOLOFT) 100 MG tablet Take 1/2 tablet nightly for 7 days then increase to 1 tablet nightly.   . testosterone cypionate (DEPO-TESTOSTERONE) 200 MG/ML injection Inject 0.75 mLs (150 mg total) into the muscle every 14 (fourteen) days. 06/26/2017: Due 06/28/2017   . febuxostat (ULORIC) 40 MG tablet Take 1 tablet (40 mg total) by mouth daily. (Patient not taking: Reported on 07/06/2017) 07/06/2017: Too expensive   . [DISCONTINUED] loratadine (CLARITIN) 10 MG tablet Take 1 tablet (10 mg total) by mouth daily. (Patient not taking: Reported on 07/06/2017)   . [DISCONTINUED] sildenafil (REVATIO) 20 MG tablet Take 20 mg by mouth.    No facility-administered encounter medications on file as of 07/06/2017.     Functional Status: In your present state of health, do you have any difficulty performing the following activities: 06/27/2017 06/12/2017  Hearing? - N  Vision? - N  Difficulty concentrating or making decisions? - N  Walking or climbing stairs? Y Y  Comment - -  Dressing or bathing? - Y  Doing errands, shopping? - Y  Preparing Food and eating ? - -  Using the Toilet? - -  In the past six months, have you accidently leaked urine? - -  Comment - -  Do you have problems with loss of bowel control? - -  Managing your Medications? - -  Managing your Finances? - -  Housekeeping or managing your Housekeeping? - -  Some recent data might be hidden     Fall/Depression Screening: Fall Risk  02/08/2017 10/05/2016  04/18/2016  Falls in the past year? No No No  Comment - - -   PHQ 2/9 Scores 10/05/2016  PHQ - 2 Score 0    ASSESSMENT: Date Discharged from Hospital: 06/28/2017 Date Discharged from [place]:  Date Medication Reconciliation Performed: 07/06/2017  Medications Discontinued at Discharge:   Cefazolin   Oxycodone-acetaminophen  miralax  Senokot   New Medications at Discharge:  Aspirin 325 mg (this is been since discontinued)  Since discharge, he has been discontinued on plavix and aspirin, started on eliquis (dose appropriate) and started on uloric.    Patient was recently discharged from hospital and all medications have been reviewed  Drugs sorted by system:  Neurologic/Psychologic: sinemet,  sertraline  Allergy:epipen  Cardiovascular:apixaban, atorvastatin, carvedilol, furosemide, losartan  Endocrine:hydrocortisone, colchicine, Uloric (not taking), allopurinol, levothyroxine, testosterone  Vitamins/Minerals:multivitamin  Duplications in therapy: none (not taking uloric) Gaps in therapy: aspirin (off 2/2 increased bleed risk with eliquis) Medications to avoid in the elderly: none Drug interactions: none clinically signficant Other issues noted: needs high intensity statin however LDL <70 at most recent draw   PLAN: -Instructed patient to take new medications as prescribed and discontinue old medications as prescribed  -Updated medication list to reflect most current pharmacotherapy -clarified aspirin plan with patient after chart review -Sent inbasket message to Clarene Reamer, FNP recommending dc uloric and restart allopurinol at 100 mg BID, draw uric acid and titrate as appropriate.  -Counseled patient on s/sx bleeding and falls precaution  -Encouraged patient to bring updated med list to all follow up appointments and encouraged him to make all follow up appointments -Counseled on food/drink to help avoid gout attacks -Encouraged daily weights and salt restriction  Will follow up as needed with allopurinol clarification. Otherwise, will hand patient off to Ralene Bathe, [facility] pharmacist for follow up.    Carlean Jews, Pharm.D., BCPS PGY2 Ambulatory Care Pharmacy Resident Phone: [phone]

## 2017-07-06 NOTE — Patient Outreach (Signed)
[place] [facility]) Care Management  07/06/2017  Dennis Beigel Saraceni Sr. 1946-06-24 [id number]   CSW spoke with patent's wife by phone today who reports they have changed his PCP to [facility] and had a visit on 07/05/17.  " We like the practice a lot".  She also shared with CSW they made some RX changes and the patient and wife are uncertain about the changes made as they heard things differently.  CSW placed a consult in for [facility] RPH to reach out to them to help clarify the correct meds and doses.  Patient's wife has been home with patient since he was released and feels things are going well. CSW will plan f/u call next week for updates, further assessment of needs and plan visit if needed.      [facility] CM Care Plan Problem One     Most Recent Value  Care Plan Problem One  Admitted to SNF for rehab.  (Pended)   Role Documenting the Problem One  Clinical Social Worker  (Pended)   Care Plan for Problem One  Active  (Pended)   [facility] CM Short Term Goal #1   Patient will participate with therapy and goals at SNF in the next 30 days.  (Pended)   [facility] CM Short Term Goal #1 Start Date  06/22/17  (Pended)   [facility]N CM Short Term Goal #1 Met Date  06/29/17  (Pended)   Interventions for Short Term Goal #1  CSW discussed and encouraged patient regarding rehab goals.  (Pended)   THN CM Short Term Goal #2   Patient will be released from SNF to home with adequate support and services arranged in the next 30 days.   (Pended)   THN CM Short Term Goal #2 Start Date  06/22/17  (Pended)   THN CM Short Term Goal #2 Met Date  06/29/17  (Pended)   Interventions for Short Term Goal #2  CSW will discuss and assist with coordination of dc plans and needs via patient and family and SNF staff.   (Pended)     [facility] CM Care Plan Problem Two     Most Recent Value  Care Plan Problem Two  Patient released to home from SNF with limitations and needs.  (Pended)   Role Documenting the Problem Two  Clinical Social  Worker  (Pended)   Care Plan for Problem Two  Active  (Pended)   [facility] CM Short Term Goal #1   Patient and wife will be connected with resources and support in the next 30 days.  (Pended)   THN CM Short Term Goal #1 Start Date  06/29/17  (Pended)   Interventions for Short Term Goal #2   CSW will complete assessment and determine goals post SNF via home visit.   (Pended)       Eduard Clos, MSW, [place] Worker  [place] [phone]

## 2017-07-07 ENCOUNTER — Inpatient Hospital Stay: Payer: Medicare Other | Admitting: Family Medicine

## 2017-07-07 ENCOUNTER — Telehealth: Payer: Self-pay | Admitting: Pharmacist

## 2017-07-07 ENCOUNTER — Other Ambulatory Visit: Payer: Self-pay | Admitting: *Deleted

## 2017-07-07 NOTE — Progress Notes (Signed)
This encounter was created in error - please disregard.

## 2017-07-07 NOTE — Patient Outreach (Signed)
[place] [facility]) Care Management  07/07/2017  Dennis Mccannon Budzik Sr. 10/06/1945 [id number]   CSW spoke with patient's wife again by phone- she reports they were able to get the medications clarified and appreciative of the help/link with RPH.  Patient's wife indicates they are expecting a visit from [facility] but not heard from a Nurse. CSW contacted Landis Martins, [place], who planned to look into it further and advise patient and wife.  Per wife, patient is nauseated today but otherwise doing well.  CSW plans f/u call next week to assess for further CSW needs/support.   Eduard Clos, MSW, [place] Worker  [place] [phone]

## 2017-07-07 NOTE — Patient Outreach (Signed)
[facility] [facility]) Care Management  07/07/2017  Iris Tatsch Pew Sr. September 27, 1945 [id number]  Transition of care call   71 year old male with [facility] admission 11/27- 11/29, Dx: Cellulitis right lower extremity  12/3 Readmission to [facility] for Right leg cellulitis, D/C on 12/6 to [facility] SNF for short term rehab,and IV Antibiotics. 12/17 Readmitted to [facility] with new CVA. 12/19 Discharged to Home, with home health services.  PMHx: includes bu not limited to , Parkinson's , obstructive sleep apena, Addison Disease, CVA, history of agent orange exposure.   1:50 Unsuccessful outreach call, able to leave a HIPAA compliant message for return call.  Will plan return call later today.   1530 Patient returned call, states he is feeling better on now compared to earlier today. Patient discussed discomfort in back below rib area , states it has been going on /off for the last  3 weeks,  feeling queasy, and out of sorts earlier this morning so he went back to bed. Reports after resting he feels much better. Patient discussed he did not feel like physical therapy visit on today so he cancelled visit and plans reschedule for 12/29.  Patient discussed having home health physical therapy, but not a RN visiting, he is requesting a home health RN visit every week or two, he discussed beginning to do testosterone injections at home, he has the serum, unsure if he has all the appropriate needles.  Patient discussed upcoming appointment at [facility] to follow up on getting medical equipment , U walker, scooter. Patient discussed currently he has a cane to use if need, and managing okay at home, patient wife and son assist as needed.   Patient discussed today's weight being 364, no noted weight increases,no increase in shortness of breath or swelling.   Discussed initial home visit as part of transition of care, RN care manager role/ services and  patient agreeable .  Plan Placed call to [facility] spoke with representative Rip Harbour regarding patient request for [facility] for visits, she will follow up with Dr.Gessner office regarding request .  Will schedule initial transition of care home visit for next week.   Joylene Draft, RN, [place] Management Coordinator  [phone]- Mobile [phone]- Toll Free Main Office

## 2017-07-07 NOTE — Patient Outreach (Signed)
[facility]) Care Management  07/07/2017  Dennis Guild Gargis Sr. 12/27/1945 [id number]   71 y.o. year old male referred to [facility] for Medication Management (Pharmacy telephone follow up)   Patient confirms identity with HIPAA-identifiers x2 and gives verbal consent to speak over the phone about medications.   Received communication from patient's primary care provider, Clarene Reamer, FNP, that she has sent a new prescription for allopurinol to the pharmacy. Patient confirms that this fill is ready and waiting for him at the pharmacy. Patient denies any further questions or pharmacy-related needs.   Will sign off on patient and close pharmacy case.   Carlean Jews, Pharm.D., BCPS PGY2 Ambulatory Care Pharmacy Resident Phone: [phone]

## 2017-07-08 ENCOUNTER — Encounter: Payer: Self-pay | Admitting: Family Medicine

## 2017-07-10 ENCOUNTER — Other Ambulatory Visit: Payer: Self-pay | Admitting: *Deleted

## 2017-07-10 DIAGNOSIS — G20 Parkinson's disease: Secondary | ICD-10-CM | POA: Diagnosis not present

## 2017-07-10 DIAGNOSIS — E669 Obesity, unspecified: Secondary | ICD-10-CM | POA: Diagnosis not present

## 2017-07-10 DIAGNOSIS — E271 Primary adrenocortical insufficiency: Secondary | ICD-10-CM | POA: Diagnosis not present

## 2017-07-10 DIAGNOSIS — F329 Major depressive disorder, single episode, unspecified: Secondary | ICD-10-CM | POA: Diagnosis not present

## 2017-07-10 DIAGNOSIS — I110 Hypertensive heart disease with heart failure: Secondary | ICD-10-CM | POA: Diagnosis not present

## 2017-07-10 DIAGNOSIS — Z8673 Personal history of transient ischemic attack (TIA), and cerebral infarction without residual deficits: Secondary | ICD-10-CM | POA: Diagnosis not present

## 2017-07-10 DIAGNOSIS — M1A9XX1 Chronic gout, unspecified, with tophus (tophi): Secondary | ICD-10-CM | POA: Diagnosis not present

## 2017-07-10 DIAGNOSIS — Z6841 Body Mass Index (BMI) 40.0 and over, adult: Secondary | ICD-10-CM | POA: Diagnosis not present

## 2017-07-10 DIAGNOSIS — G4733 Obstructive sleep apnea (adult) (pediatric): Secondary | ICD-10-CM | POA: Diagnosis not present

## 2017-07-10 DIAGNOSIS — M545 Low back pain: Secondary | ICD-10-CM | POA: Diagnosis not present

## 2017-07-10 DIAGNOSIS — E785 Hyperlipidemia, unspecified: Secondary | ICD-10-CM | POA: Diagnosis not present

## 2017-07-10 DIAGNOSIS — I5032 Chronic diastolic (congestive) heart failure: Secondary | ICD-10-CM | POA: Diagnosis not present

## 2017-07-10 DIAGNOSIS — Z7901 Long term (current) use of anticoagulants: Secondary | ICD-10-CM | POA: Diagnosis not present

## 2017-07-10 DIAGNOSIS — Z9181 History of falling: Secondary | ICD-10-CM | POA: Diagnosis not present

## 2017-07-10 DIAGNOSIS — E039 Hypothyroidism, unspecified: Secondary | ICD-10-CM | POA: Diagnosis not present

## 2017-07-10 DIAGNOSIS — G8929 Other chronic pain: Secondary | ICD-10-CM | POA: Diagnosis not present

## 2017-07-10 DIAGNOSIS — C679 Malignant neoplasm of bladder, unspecified: Secondary | ICD-10-CM | POA: Diagnosis not present

## 2017-07-10 DIAGNOSIS — I2510 Atherosclerotic heart disease of native coronary artery without angina pectoris: Secondary | ICD-10-CM | POA: Diagnosis not present

## 2017-07-10 NOTE — Progress Notes (Signed)
This encounter was created in error - please disregard.

## 2017-07-10 NOTE — Patient Outreach (Signed)
[place] [facility]) Care Management  [facility] Social Work  07/10/2017  Leroi Haque Hartzell Sr. 1946-04-04 [id number]  Subjective:  "he is sleeping a lot and very weak".   Objective: [facility] CSW to assist patient and family with community based resources to aide in their well-being, quality of life and overall safety and needs.    Encounter Medications:  Outpatient Encounter Medications as of 07/10/2017  Medication Sig Note  . allopurinol (ZYLOPRIM) 100 MG tablet Take 1 tablet (100 mg total) by mouth 2 (two) times daily.   Marland Kitchen apixaban (ELIQUIS) 5 MG TABS tablet Take 1 tablet (5 mg total) by mouth 2 (two) times daily.   Marland Kitchen atorvastatin (LIPITOR) 20 MG tablet TAKE 1 TABLET(20 MG) BY MOUTH DAILY   . carbidopa-levodopa (SINEMET IR) 25-250 MG tablet Take 2 tablets by mouth 4 (four) times daily. 06/27/2017: 9am, 1pm, 5pm, 9pm  . carvedilol (COREG) 3.125 MG tablet Take 3.125 mg by mouth 2 (two) times daily with a meal.   . colchicine 0.6 MG tablet Take 1 tablet (0.6 mg total) by mouth 2 (two) times daily.   Marland Kitchen EPINEPHrine 0.3 mg/0.3 mL IJ SOAJ injection Inject 0.3 mLs (0.3 mg total) into the muscle as needed (allergic reaction). 06/09/2017: Has on hand   . furosemide (LASIX) 20 MG tablet Take 2 tablets (40 mg total) by mouth daily. (Patient taking differently: Take 40 mg by mouth 2 (two) times daily. ) 06/09/2017: Reports taking 40 mg twice daily  . hydrocortisone (CORTEF) 10 MG tablet 2 tablets in the morning and 1 tablet at 5 PM. Please start taking on 06/18/17 (Patient taking differently: 2 tablets (20 mg) in the morning and 1 tablet (10 mg) in the afternoon)   . levothyroxine (SYNTHROID, LEVOTHROID) 112 MCG tablet TAKE 2 TABLETS(224 MCG) BY MOUTH DAILY BEFORE BREAKFAST (Patient taking differently: TAKE 2 TABLETS BY MOUTH EVERY MORNING AND 1 TABLET BY MOUTH EVERY EVENING)   . losartan (COZAAR) 100 MG tablet TAKE 1 TABLET(100 MG) BY MOUTH DAILY   . Multiple Vitamin (MULTIVITAMIN WITH MINERALS) TABS  tablet Take 1 tablet by mouth daily.   . sertraline (ZOLOFT) 100 MG tablet Take 1/2 tablet nightly for 7 days then increase to 1 tablet nightly.   . testosterone cypionate (DEPO-TESTOSTERONE) 200 MG/ML injection Inject 0.75 mLs (150 mg total) into the muscle every 14 (fourteen) days. 06/26/2017: Due 06/28/2017   No facility-administered encounter medications on file as of 07/10/2017.     Functional Status:  In your present state of health, do you have any difficulty performing the following activities: 06/27/2017 06/12/2017  Hearing? - N  Vision? - N  Difficulty concentrating or making decisions? - N  Walking or climbing stairs? Y Y  Comment - -  Dressing or bathing? - Y  Doing errands, shopping? - Y  Preparing Food and eating ? - -  Using the Toilet? - -  In the past six months, have you accidently leaked urine? - -  Comment - -  Do you have problems with loss of bowel control? - -  Managing your Medications? - -  Managing your Finances? - -  Housekeeping or managing your Housekeeping? - -  Some recent data might be hidden    Fall/Depression Screening:  PHQ 2/9 Scores 10/05/2016  PHQ - 2 Score 0    Assessment:  CSW spoke with patient's wife who reports he is doing HHPT.  She feels that he is becoming more depressed and "worn out".  Per wife, their anniversary  is January 22nd and their birthdays are both in March- "I think he is going to hang on until March and then give up".  Per wife, they had conversation with Palliative team at hospital in November and have not heard anything since that time.  CSW encouraged wife to talk to patient and if they desire, call PCP and ask that they initiate the referral again. At this time she feels that is what the patient wants and plans to call PCP.    Plan:   CSW will call again in the next 5-7 days to offer support and assistance, as well as to determine further plans, goals and CSW involvement.   Eduard Clos, MSW, [place]  Worker  [place] [phone]

## 2017-07-11 DIAGNOSIS — H269 Unspecified cataract: Secondary | ICD-10-CM

## 2017-07-11 HISTORY — DX: Unspecified cataract: H26.9

## 2017-07-12 ENCOUNTER — Ambulatory Visit: Payer: Medicare Other | Admitting: Neurology

## 2017-07-12 ENCOUNTER — Telehealth: Payer: Self-pay | Admitting: Cardiovascular Disease

## 2017-07-12 ENCOUNTER — Ambulatory Visit (INDEPENDENT_AMBULATORY_CARE_PROVIDER_SITE_OTHER): Payer: Medicare Other | Admitting: Family Medicine

## 2017-07-12 ENCOUNTER — Encounter: Payer: Self-pay | Admitting: Family Medicine

## 2017-07-12 VITALS — BP 112/74 | HR 90 | Temp 97.4°F | Ht 77.0 in | Wt 364.5 lb

## 2017-07-12 DIAGNOSIS — G20 Parkinson's disease: Secondary | ICD-10-CM | POA: Diagnosis not present

## 2017-07-12 DIAGNOSIS — R5382 Chronic fatigue, unspecified: Secondary | ICD-10-CM

## 2017-07-12 DIAGNOSIS — I10 Essential (primary) hypertension: Secondary | ICD-10-CM

## 2017-07-12 DIAGNOSIS — N179 Acute kidney failure, unspecified: Secondary | ICD-10-CM

## 2017-07-12 DIAGNOSIS — E039 Hypothyroidism, unspecified: Secondary | ICD-10-CM

## 2017-07-12 DIAGNOSIS — R5383 Other fatigue: Secondary | ICD-10-CM | POA: Insufficient documentation

## 2017-07-12 DIAGNOSIS — N189 Chronic kidney disease, unspecified: Secondary | ICD-10-CM

## 2017-07-12 LAB — CBC WITH DIFFERENTIAL/PLATELET
Basophils Absolute: 0 10*3/uL (ref 0.0–0.1)
Basophils Relative: 0.6 % (ref 0.0–3.0)
Eosinophils Absolute: 0.2 10*3/uL (ref 0.0–0.7)
Eosinophils Relative: 2.8 % (ref 0.0–5.0)
HCT: 50.6 % (ref 39.0–52.0)
Hemoglobin: 16.3 g/dL (ref 13.0–17.0)
Lymphocytes Relative: 18.3 % (ref 12.0–46.0)
Lymphs Abs: 1.1 10*3/uL (ref 0.7–4.0)
MCHC: 32.3 g/dL (ref 30.0–36.0)
MCV: 82 fl (ref 78.0–100.0)
Monocytes Absolute: 0.6 10*3/uL (ref 0.1–1.0)
Monocytes Relative: 9 % (ref 3.0–12.0)
Neutro Abs: 4.3 10*3/uL (ref 1.4–7.7)
Neutrophils Relative %: 69.3 % (ref 43.0–77.0)
Platelets: 195 10*3/uL (ref 150.0–400.0)
RBC: 6.17 Mil/uL — ABNORMAL HIGH (ref 4.22–5.81)
RDW: 15.8 % — ABNORMAL HIGH (ref 11.5–15.5)
WBC: 6.2 10*3/uL (ref 4.0–10.5)

## 2017-07-12 LAB — POC URINALSYSI DIPSTICK (AUTOMATED)
Blood, UA: NEGATIVE
Glucose, UA: NEGATIVE
Ketones, UA: 5
Leukocytes, UA: NEGATIVE
Nitrite, UA: NEGATIVE
Protein, UA: 15
Spec Grav, UA: >=1.03 — AB (ref 1.010–1.025)
Urobilinogen, UA: 0.2 E.U./dL
pH, UA: 6 (ref 5.0–8.0)

## 2017-07-12 LAB — HEPATIC FUNCTION PANEL
ALT: 12 U/L (ref 0–53)
AST: 16 U/L (ref 0–37)
Albumin: 4.1 g/dL (ref 3.5–5.2)
Alkaline Phosphatase: 61 U/L (ref 39–117)
Bilirubin, Direct: 0.2 mg/dL (ref 0.0–0.3)
Total Bilirubin: 1.2 mg/dL (ref 0.2–1.2)
Total Protein: 7 g/dL (ref 6.0–8.3)

## 2017-07-12 LAB — BASIC METABOLIC PANEL
BUN: 16 mg/dL (ref 6–23)
CO2: 32 mEq/L (ref 19–32)
Calcium: 9.5 mg/dL (ref 8.4–10.5)
Chloride: 100 mEq/L (ref 96–112)
Creatinine, Ser: 1 mg/dL (ref 0.40–1.50)
GFR: 78.12 mL/min (ref >60.00)
Glucose, Bld: 90 mg/dL (ref 70–99)
Potassium: 4 mEq/L (ref 3.5–5.1)
Sodium: 138 mEq/L (ref 135–145)

## 2017-07-12 LAB — TSH: TSH: 0.11 u[IU]/mL — ABNORMAL LOW (ref 0.35–4.50)

## 2017-07-12 LAB — VITAMIN B12: Vitamin B-12: 490 pg/mL (ref 211–911)

## 2017-07-12 NOTE — Telephone Encounter (Signed)
New message   Pt verbalized that he is not feeling well he feel out of source mentally

## 2017-07-12 NOTE — Telephone Encounter (Signed)
Thanks!    Chris.

## 2017-07-12 NOTE — Telephone Encounter (Signed)
Receiving call transferred directly from operator and spoke with pt.  Wife is on speaker phone.  Pt reports he was hospitalized with stroke on 06/26/17. Loop recorder showed he was in afib at this time. Pt reports BP is dropping in the mornings. Feels weak. He does not have BP cuff to monitor readings. States when Physical therapist was there last Friday BP was low. Does not know reading. BP improved to 130/60 when he stood up. Pt feels BP is running lower than it used to be. Medication changes were made during recent hospitalization.  Is being followed by [facility] and has first visit with RN tomorrow. Also feels "mentally out of sorts".  This has been going on since stroke. Wife is with him today.  He is not having any numbness, weakness or other stroke like symptoms. I advised pt to contact primary care today to have all symptoms evaluated.   I scheduled pt to see Richardson Dopp, PA on 07/14/16 at 8:15 for cardiology evaluation.  I told pt he should go to ED for evaluation if symptoms worsen

## 2017-07-12 NOTE — Patient Instructions (Signed)
Labs today and urinalysis as well  Hold your losartan/lasix and any sedating medications   If you develop leg swelling or shortness of breath alert [name]   See cardiology as planned on friday
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
## 2017-07-13 ENCOUNTER — Encounter: Payer: Self-pay | Admitting: *Deleted

## 2017-07-13 ENCOUNTER — Telehealth: Payer: Self-pay | Admitting: Family Medicine

## 2017-07-13 ENCOUNTER — Other Ambulatory Visit: Payer: Self-pay | Admitting: *Deleted

## 2017-07-13 NOTE — Telephone Encounter (Signed)
Called and left Dennis [name] a message to return my call for verbal order for PT for patient. This is the first message I have received regarding a PT order.

## 2017-07-13 NOTE — Assessment & Plan Note (Signed)
Pt c/o of low bp in setting of Parkinsons and CHF Symptomatic (light headed) but neg orthostatics  BP: 112/74 -did not take losartan or lasix today  ? If poss dehydration  Also Parkinsons may play a role  Adv to hold both medicines until he sees cardiology on Friday  Labs today

## 2017-07-13 NOTE — Telephone Encounter (Signed)
Copied from [place] [id number]. Topic: Quick Communication - See Telephone Encounter >> Jul 13, 2017  8:20 AM Oneta Rack wrote: CRM for notification. See Telephone encounter for:   07/13/17.  Caller name: Gerald Stabs  Relation to pt: -PT [facility] Call back number: [phone]   Reason for call:  2x attempt to receive verbal order for PT 1x 2 2x 3. PT would like to visit patient today, please advise

## 2017-07-13 NOTE — Assessment & Plan Note (Addendum)
Fatigue/malaise/sleepiness/ dec concentration in medically complex pt not known to me  Rev med hx  Rev cardiac/neuro notes  Suspect multifactorial - with hypotension (? Dehydration), depression (pt denies), parkinsons , Addisons, hypothyroid , polypharmacy , low testosterone  Lab today  Address low bp-hold meds/see cardiology on fri  Reassuring exam-no signs of infection

## 2017-07-13 NOTE — Patient Outreach (Signed)
[facility]) Care Management   07/13/2017  Dennis [name] Dennis Sr. 1946-05-18 [id number]  Dennis Karnes Nusz Sr. is an 72 y.o. male   Transition of care home visit   72 year old male with [facility] admission 11/27- 11/29, Dx: Cellulitis right lower extremity 12/3 Readmission to [facility] for Right leg cellulitis, D/C on 12/6 to [facility] for short term rehab,and IV Antibiotics. 12/17 Readmitted to [facility] with new CVA. 12/19 Discharged to Home, with home health services. PMHx: includes bu not limited to , Parkinson's , obstructive sleep apena, Addison Disease, CVA, history of agent orange exposure.  Subjective:  Patient reports feeling better on today , discussed feeling weak and out of sorts on yesterday, discussed visit at MD office very pleased with through assessment .    Objective:  Vitals:   07/13/17 1143  BP: 120/80  Pulse: 85  Resp: 18  SpO2: 96%  Weight  364 lbs Review of Systems  Constitutional: Negative.   HENT: Negative.   Eyes: Negative.   Respiratory: Negative.  Negative for cough and shortness of breath.   Cardiovascular: Negative for chest pain.  Gastrointestinal: Negative.   Genitourinary: Negative.   Musculoskeletal:       Right lower rib pain at times  Skin: Negative.   Neurological: Positive for dizziness and tremors.  Endo/Heme/Allergies: Bruises/bleeds easily.  Psychiatric/Behavioral:       Short term memory at times     Physical Exam  Constitutional: He is oriented to person, place, and time. He appears well-developed and well-nourished.  Cardiovascular: Normal rate.  Respiratory: Effort normal.  GI: Soft.  Neurological: He is alert and oriented to person, place, and time.  Skin: Skin is warm and dry. There is erythema.     Psychiatric: He has a normal [name] and affect. His speech is normal and behavior is normal. Judgment and thought content normal. He does not exhibit a depressed [name].    Encounter Medications:     Outpatient Encounter Medications as of 07/13/2017  Medication Sig Note  . allopurinol (ZYLOPRIM) 100 MG tablet Take 1 tablet (100 mg total) by mouth 2 (two) times daily.   Marland Kitchen apixaban (ELIQUIS) 5 MG TABS tablet Take 1 tablet (5 mg total) by mouth 2 (two) times daily.   Marland Kitchen atorvastatin (LIPITOR) 20 MG tablet TAKE 1 TABLET(20 MG) BY MOUTH DAILY   . carbidopa-levodopa (SINEMET IR) 25-250 MG tablet Take 2 tablets by mouth 4 (four) times daily. 06/27/2017: 9am, 1pm, 5pm, 9pm  . carvedilol (COREG) 3.125 MG tablet Take 3.125 mg by mouth 2 (two) times daily with a meal.   . colchicine 0.6 MG tablet Take 1 tablet (0.6 mg total) by mouth 2 (two) times daily.   Marland Kitchen EPINEPHrine 0.3 mg/0.3 mL IJ SOAJ injection Inject 0.3 mLs (0.3 mg total) into the muscle as needed (allergic reaction). 06/09/2017: Has on hand   . hydrocortisone (CORTEF) 10 MG tablet 2 tablets in the morning and 1 tablet at 5 PM. Please start taking on 06/18/17 (Patient taking differently: 2 tablets (20 mg) in the morning and 1 tablet (10 mg) in the afternoon)   . levothyroxine (SYNTHROID, LEVOTHROID) 112 MCG tablet TAKE 2 TABLETS(224 MCG) BY MOUTH DAILY BEFORE BREAKFAST (Patient taking differently: TAKE 2 TABLETS BY MOUTH EVERY MORNING AND 1 TABLET BY MOUTH EVERY EVENING)   . sertraline (ZOLOFT) 100 MG tablet Take 1/2 tablet nightly for 7 days then increase to 1 tablet nightly.   . testosterone cypionate (DEPO-TESTOSTERONE) 200 MG/ML  injection Inject 0.75 mLs (150 mg total) into the muscle every 14 (fourteen) days. 06/26/2017: Due 06/28/2017  . furosemide (LASIX) 20 MG tablet Take 2 tablets (40 mg total) by mouth daily. (Patient not taking: Reported on 07/13/2017) 06/09/2017: Reports taking 40 mg twice daily  . losartan (COZAAR) 100 MG tablet TAKE 1 TABLET(100 MG) BY MOUTH DAILY (Patient not taking: Reported on 07/13/2017)   . Multiple Vitamin (MULTIVITAMIN WITH MINERALS) TABS tablet Take 1 tablet by mouth daily. (Patient not taking: Reported on 07/13/2017)     No facility-administered encounter medications on file as of 07/13/2017.     Functional Status:   In your present state of health, do you have any difficulty performing the following activities: 07/13/2017 06/27/2017  Hearing? N -  Vision? Y -  Difficulty concentrating or making decisions? N -  Walking or climbing stairs? Y Y  Comment - -  Dressing or bathing? Y -  Doing errands, shopping? Y -  Comment wife assist  -  Conservation officer, nature and eating ? Y -  Comment wife prepares -  Using the Toilet? N -  In the past six months, have you accidently leaked urine? Y -  Comment wears depends  -  Do you have problems with loss of bowel control? N -  Managing your Medications? N -  Managing your Finances? Y -  Comment wife helps  -  Housekeeping or managing your Housekeeping? Y -  Comment wife assist  -  Some recent data might be hidden    Fall/Depression Screening:    Fall Risk  07/07/2017 02/08/2017 10/05/2016  Falls in the past year? No No No  Comment - - -  Risk for fall due to : Impaired balance/gait - -   PHQ 2/9 Scores 10/05/2016  PHQ - 2 Score 0    Assessment:  Transition of care home visit . Patient [name] present. Reviewed [facility] program and follow up .  Discussed with patient EMMI red alert from yesterday, and his answer of feeling worse on 1/2, patient reports feeling better today. Reports improved appetite tolerating liquids.  Patient participated in telephone call as consultant  with his prior business during visit denies problem with concentration, reports being able to recall that type  information without difficulty.   Low blood pressure concerns Wife has purchased wrist blood pressure monitor, reading today 124/76, manual reading during visit 120/80, reinforced daily monitoring and keeping a record. Reviewed fall precautions. Reinforced drinking adequate liquids, he received result from recent office visit , that informed him urine study  indicated he may be a little  dehydrated and to drink adequate fluids. Recent CVA No new symptoms, reviewed signs and symptoms and seeking immediate medical attention for stroke symptoms .  Heart failure  Weighing on most day,denies shortness of breath or increased swelling, today's weight reported at 364 without increase of weight greater than 2 pounds in a day. Reviewed sodium restriction and reinforced weighing daily.  Reviewed medications, and patient able to state recent medication changes, of holding lasix and losartan until appointment with cardiology on 12/4.  Parkinson's Taking medication as prescribed, has attended post hospital visit with neurologist in the last week.Patient to begin home health physical therapy,[facility] therapist to visit once orders approved by MD, explained to patient office has been notified and will follow up with physical therapy.   Patient ambulating in home , has cane for use , reports holding on to wall to get around.  Patient has appointment at [facility]  in [place] on 1/8  regarding getting equipment , U walker scooter, arrangements for ramps. Palliative care consult  Wife has spoken with Maurice March, inpatient palliative care team on 1/2 late afternoon,  regarding prior conversation related to palliative care referral at [facility]  during [facility] admission on 11/29.  Wife anticipating return call .Will follow up regarding coordination of referral.  Low  Testosterone  Reports overdue for injection and to  begin giving injections at home. Wife will need coaching on administration, she has personal experience with IM injections .      Plan:  Will continue weekly transition of care outreaches, next call in a week.  Reinforced monitoring blood pressure readings in home, EMMI on low blood pressure.  Provided [facility] calendar for documentation of weights  Provided EMMI handout of preventing stroke Reinforced daily weights and presented zone information  Patient will attend  visit at [facility] for equipment needs Will follow up with [facility] palliatve care regarding referral and PCP if indicated.  Will assist wife with education on giving testosterone injection at home. Reviewed by  demonstration of drawing up and administering  Testosterone, using teach back, video education, wife completed injection with good results.  Will send this visit note to  PCP in basket.   THN CM Care Plan Problem One     Most Recent Value  Care Plan Problem One  At risk for readmission,related to recent admission with cellulits   Role Documenting the Problem One  Care Management [place] for Problem One  Active  THN Long Term Goal   Patient will not experience a hospital admission in the next 31 days   [place] Term Goal Start Date  06/29/17 [date restarted, after readmission ]  Interventions for Problem One Long Term Goal  Reviewed current clinical status , provided EMMI handout on preventing stroke, reviewed symptoms  THN CM Short Term Goal #1   Patient will attend all medical appointment in the next 30 days   THN CM Short Term Goal #1 Start Date  06/29/17 Sudie Grumbling restart ]  Interventions for Short Term Goal #1  Reviewed upcoming visits with cardi[facility] and [facility]   [facility] CM Short Term Goal #2   Patient will be able to report improvement in cellulits symptoms in the next 30 days   THN CM Short Term Goal #2 Start Date  06/09/17  [facility] CM Short Term Goal #2 Met Date  06/29/17  [facility] CM Short Term Goal #3  Patient will report increased strength  in the next 30 days   THN CM Short Term Goal #3 Start Date  06/29/17  Interventions for Short Tern Goal #3  Discussed physical therapy plans for treatment , reviewed with patient , MD to give orders   [facility] CM Short Term Goal #4  Patient will report monitoring blood pressures daily and keeping a record in the next 30 days   THN CM Short Term Goal #4 Start Date  07/13/17  Interventions for Short Term Goal #4  Discussed monitoring blood pressures,  reviewed demonstration on use of new monitor,, instructed regarding documentation in [facility] calendar, reviewed notifying MD of lower reading and symptoms of increased weakness, dizziness.       Joylene Draft, RN, [place] Management Coordinator  [phone]- Mobile [phone]- Toll Free Main Office
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
## 2017-07-13 NOTE — Assessment & Plan Note (Signed)
Labs today  ? If poss dry/dehydrated given low bp and light headedness

## 2017-07-13 NOTE — Assessment & Plan Note (Signed)
Pt is having some decreased concentration and low bp  ? If related -discussed this  Continue neuro f/u

## 2017-07-13 NOTE — Assessment & Plan Note (Signed)
Sees Dr Dwyane Dee In light of fatigue/malaise/sleepiness/bp change-check TSH today

## 2017-07-13 NOTE — Patient Outreach (Signed)
[facility]) Care Management  [facility] Social Work  07/13/2017  Dennis Aultman Mahone Sr. October 22, 1945 [id number]  Subjective:  "We just had a visit from [place]"  Objective: [facility] CSW to assist patient and family with community based resources to aide in their well-being, quality of life and overall safety and needs.    Encounter Medications:  Outpatient Encounter Medications as of 07/13/2017  Medication Sig Note  . allopurinol (ZYLOPRIM) 100 MG tablet Take 1 tablet (100 mg total) by mouth 2 (two) times daily.   Marland Kitchen apixaban (ELIQUIS) 5 MG TABS tablet Take 1 tablet (5 mg total) by mouth 2 (two) times daily.   Marland Kitchen atorvastatin (LIPITOR) 20 MG tablet TAKE 1 TABLET(20 MG) BY MOUTH DAILY   . carbidopa-levodopa (SINEMET IR) 25-250 MG tablet Take 2 tablets by mouth 4 (four) times daily. 06/27/2017: 9am, 1pm, 5pm, 9pm  . carvedilol (COREG) 3.125 MG tablet Take 3.125 mg by mouth 2 (two) times daily with a meal.   . colchicine 0.6 MG tablet Take 1 tablet (0.6 mg total) by mouth 2 (two) times daily.   Marland Kitchen EPINEPHrine 0.3 mg/0.3 mL IJ SOAJ injection Inject 0.3 mLs (0.3 mg total) into the muscle as needed (allergic reaction). 06/09/2017: Has on hand   . furosemide (LASIX) 20 MG tablet Take 2 tablets (40 mg total) by mouth daily. (Patient not taking: Reported on 07/13/2017) 06/09/2017: Reports taking 40 mg twice daily  . hydrocortisone (CORTEF) 10 MG tablet 2 tablets in the morning and 1 tablet at 5 PM. Please start taking on 06/18/17 (Patient taking differently: 2 tablets (20 mg) in the morning and 1 tablet (10 mg) in the afternoon)   . levothyroxine (SYNTHROID, LEVOTHROID) 112 MCG tablet TAKE 2 TABLETS(224 MCG) BY MOUTH DAILY BEFORE BREAKFAST (Patient taking differently: TAKE 2 TABLETS BY MOUTH EVERY MORNING AND 1 TABLET BY MOUTH EVERY EVENING)   . losartan (COZAAR) 100 MG tablet TAKE 1 TABLET(100 MG) BY MOUTH DAILY (Patient not taking: Reported on 07/13/2017)   . Multiple Vitamin (MULTIVITAMIN WITH MINERALS)  TABS tablet Take 1 tablet by mouth daily. (Patient not taking: Reported on 07/13/2017)   . sertraline (ZOLOFT) 100 MG tablet Take 1/2 tablet nightly for 7 days then increase to 1 tablet nightly.   . testosterone cypionate (DEPO-TESTOSTERONE) 200 MG/ML injection Inject 0.75 mLs (150 mg total) into the muscle every 14 (fourteen) days. 06/26/2017: Due 06/28/2017   No facility-administered encounter medications on file as of 07/13/2017.     Functional Status:  In your present state of health, do you have any difficulty performing the following activities: 07/13/2017 06/27/2017  Hearing? N -  Vision? Y -  Difficulty concentrating or making decisions? N -  Walking or climbing stairs? Y Y  Dressing or bathing? Y -  Doing errands, shopping? Y -  Comment wife assist  -  Conservation officer, nature and eating ? Y -  Comment wife prepares -  Using the Toilet? N -  In the past six months, have you accidently leaked urine? Y -  Comment wears depends  -  Do you have problems with loss of bowel control? N -  Managing your Medications? N -  Managing your Finances? Y -  Comment wife helps  -  Housekeeping or managing your Housekeeping? Y -  Comment wife assist  -  Some recent data might be hidden    Fall/Depression Screening:  PHQ 2/9 Scores 07/13/2017 10/05/2016  PHQ - 2 Score 0 0    Assessment: CSW spoke with  patient's wife who reports he is sleeping after a visit from [place], [place],  Per wife,  [place] orders had not been signed off but they are pending orders from the PCP office.  Wife has also has been able to reconnect with the Palliative Nurse and is awaiting further word for their involvement.  CSW offered support to wife; she plans to go back to work next week and her son will help get him to appointments.    Plan:  Plan f/u call next week.  [facility] CM Care Plan Problem One     Most Recent Value  Care Plan Problem One  Admitted to SNF for rehab.  Role Documenting the Problem One  Clinical Social Worker  Care  Plan for Problem One  Active  [facility] CM Short Term Goal #1   Patient will participate with therapy and goals at SNF in the next 30 days.  [facility] CM Short Term Goal #1 Start Date  06/22/17  [facility] CM Short Term Goal #1 Met Date  06/29/17  Interventions for Short Term Goal #1  CSW discussed and encouraged patient regarding rehab goals.  [facility] CM Short Term Goal #2   Patient will be released from SNF to home with adequate support and services arranged in the next 30 days.   [facility] CM Short Term Goal #2 Start Date  06/22/17  [facility] CM Short Term Goal #2 Met Date  06/29/17  Interventions for Short Term Goal #2  CSW will discuss and assist with coordination of dc plans and needs via patient and family and SNF staff.   [facility] CM Short Term Goal #3  Patient and wife will report communication with PCP regarding [place] Needs, Palliative care and new PCP  secured as desired in the next 30 days.   [facility] CM Short Term Goal #3 Start Date  07/10/17  Interventions for Short Tern Goal #3  CSW assisting wife coordination of their needs/requests.     [facility] CM Care Plan Problem Two     Most Recent Value  Care Plan Problem Two  Patient released to home from SNF with limitations and needs.  Role Documenting the Problem Two  Clinical Social Worker  Care Plan for Problem Two  Active  [facility] CM Short Term Goal #1   Patient and wife will be connected with resources and support in the next 30 days.  [facility] CM Short Term Goal #1 Start Date  06/29/17  Interventions for Short Term Goal #2   CSW will complete assessment and determine goals post SNF via home visit.      Eduard Clos, MSW, [place] Worker  [place] [phone]

## 2017-07-14 ENCOUNTER — Encounter: Payer: Self-pay | Admitting: Physician Assistant

## 2017-07-14 ENCOUNTER — Ambulatory Visit (INDEPENDENT_AMBULATORY_CARE_PROVIDER_SITE_OTHER): Payer: Medicare Other | Admitting: Physician Assistant

## 2017-07-14 ENCOUNTER — Telehealth: Payer: Self-pay | Admitting: Endocrinology

## 2017-07-14 ENCOUNTER — Telehealth: Payer: Self-pay | Admitting: Family Medicine

## 2017-07-14 VITALS — BP 122/74 | HR 96 | Ht 77.0 in | Wt 364.0 lb

## 2017-07-14 DIAGNOSIS — I480 Paroxysmal atrial fibrillation: Secondary | ICD-10-CM | POA: Diagnosis not present

## 2017-07-14 DIAGNOSIS — R5383 Other fatigue: Secondary | ICD-10-CM

## 2017-07-14 DIAGNOSIS — I10 Essential (primary) hypertension: Secondary | ICD-10-CM

## 2017-07-14 DIAGNOSIS — I5032 Chronic diastolic (congestive) heart failure: Secondary | ICD-10-CM

## 2017-07-14 DIAGNOSIS — Z8673 Personal history of transient ischemic attack (TIA), and cerebral infarction without residual deficits: Secondary | ICD-10-CM | POA: Diagnosis not present

## 2017-07-14 DIAGNOSIS — I2510 Atherosclerotic heart disease of native coronary artery without angina pectoris: Secondary | ICD-10-CM

## 2017-07-14 NOTE — Telephone Encounter (Signed)
Dr Angelena Form is asking maybe the thryoid or hydrocortisone needs to be increased? Pt also wondering if he should come in sooner

## 2017-07-14 NOTE — Telephone Encounter (Signed)
Patient notified of MD's instructions via voicemail. Requested a call back if the patient would like to discuss further.

## 2017-07-14 NOTE — Telephone Encounter (Signed)
His thyroid level is slightly high, and still taking 2 tablets, 7 days a week of his levothyroxine he will take 2 tablets on 6 days and 1 tablet on the seventh day, to keep appointment as scheduled

## 2017-07-14 NOTE — Telephone Encounter (Signed)
Called and spoke to [place], triage nurse to provide verbal order to start services.

## 2017-07-14 NOTE — Telephone Encounter (Signed)
Copied from [place] [id number]. Topic: Quick Communication - See Telephone Encounter >> Jul 14, 2017  4:04 PM Oneta Rack wrote: Osvaldo Human name: Langley Gauss  Relation to pt:  referral intake coordinator for hospice of palliative care [place] Call back number: [phone] / fax # [phone]    Reason for call:  requesting verbal orders for palliative care services, please advise

## 2017-07-14 NOTE — Patient Instructions (Addendum)
Medication Instructions:  1. Your physician recommends that you continue on your current medications as directed. Please refer to the Current Medication list given to you today.   Labwork: NONE ORDERED TODAY  Testing/Procedures: NONE ORDERED TODAY  Follow-Up: 1. SCOTT WEAVER, [facility] ON 08/14/17 @ 9:15   2. KEEP YOUR APPT WITH THE [facility] 08/01/17  3. PER SCOTT WEAVER, PAC BE SURE TO CONTACT DR. Ronnie Derby OFFICE IN REGARDS TO YOUR SYMPTOMS TO BE SURE THIS IS NOT FROM YOUR ADDISON DIEASE.   Any Other Special Instructions Will Be Listed Below (If Applicable).     If you need a refill on your cardiac medications before your next appointment, please call your pharmacy.

## 2017-07-17 DIAGNOSIS — G8929 Other chronic pain: Secondary | ICD-10-CM | POA: Diagnosis not present

## 2017-07-17 DIAGNOSIS — G20 Parkinson's disease: Secondary | ICD-10-CM | POA: Diagnosis not present

## 2017-07-17 DIAGNOSIS — I110 Hypertensive heart disease with heart failure: Secondary | ICD-10-CM | POA: Diagnosis not present

## 2017-07-17 DIAGNOSIS — M545 Low back pain: Secondary | ICD-10-CM | POA: Diagnosis not present

## 2017-07-17 DIAGNOSIS — I5032 Chronic diastolic (congestive) heart failure: Secondary | ICD-10-CM | POA: Diagnosis not present

## 2017-07-17 DIAGNOSIS — I2510 Atherosclerotic heart disease of native coronary artery without angina pectoris: Secondary | ICD-10-CM | POA: Diagnosis not present

## 2017-07-19 ENCOUNTER — Telehealth: Payer: Self-pay

## 2017-07-19 ENCOUNTER — Ambulatory Visit: Payer: Self-pay | Admitting: *Deleted

## 2017-07-19 DIAGNOSIS — G8929 Other chronic pain: Secondary | ICD-10-CM | POA: Diagnosis not present

## 2017-07-19 DIAGNOSIS — I5032 Chronic diastolic (congestive) heart failure: Secondary | ICD-10-CM | POA: Diagnosis not present

## 2017-07-19 DIAGNOSIS — M545 Low back pain: Secondary | ICD-10-CM | POA: Diagnosis not present

## 2017-07-19 DIAGNOSIS — I2510 Atherosclerotic heart disease of native coronary artery without angina pectoris: Secondary | ICD-10-CM | POA: Diagnosis not present

## 2017-07-19 DIAGNOSIS — I110 Hypertensive heart disease with heart failure: Secondary | ICD-10-CM | POA: Diagnosis not present

## 2017-07-19 DIAGNOSIS — G20 Parkinson's disease: Secondary | ICD-10-CM | POA: Diagnosis not present

## 2017-07-19 NOTE — Telephone Encounter (Signed)
Dennis [name] with [facility] called with Dennis [name] from [place] on phone to speak with [name]. Dennis [name] was on phone and Dennis [name] up. I gave Dennis [name] a note about call and she asked to send note to Dennis [name]. Dennis [name] said Dennis [name] needs to know status of form. I called Dennis [name] to find out what type form but got v/m.Please advise. Dennis [name] contact # [phone].

## 2017-07-20 ENCOUNTER — Ambulatory Visit (INDEPENDENT_AMBULATORY_CARE_PROVIDER_SITE_OTHER): Payer: Medicare Other | Admitting: *Deleted

## 2017-07-20 ENCOUNTER — Other Ambulatory Visit: Payer: Self-pay | Admitting: *Deleted

## 2017-07-20 DIAGNOSIS — I639 Cerebral infarction, unspecified: Secondary | ICD-10-CM

## 2017-07-20 NOTE — Patient Outreach (Signed)
[name]) Care Management  07/20/2017  Dennis Mccarthy Ganser Sr. 03-Sep-1945 [id number]   Transition of care call   72 year old male with [name] admission 11/27- 11/29, Dx: Cellulitis right lower extremity 12/3 Readmission to [name] for Right leg cellulitis, D/C on 12/6 to [name] for short term rehab,and IV Antibiotics. 12/17 [facility]mitted to [name] with new CVA. 12/19 Discharged to Home, with home health services. PMHx: includes bu not limited to , Parkinson's , obstructive sleep apnea, Addison Disease, CVA, history of agent orange exposure.  Successful telephone outreach to patient, HIPAA verification obtained. Patient discussed having a rocky start to morning, complained of feeling out of sorts , weak and a little nauseated that was relieved after drinking coke. Patient reports feeling better at this time, he is out riding with his son.   Blood pressure concerns Continues to monitor daily with readings 110-120/70 and heart rate in the 70's, discussed he usually runs in the 140/range.  Patient reports continuing to take medications as prescribed. Patient able to report newest schedule for taking thyroid medication, levothyroxine 2 tablets on 6 days and one tablet on seventh day.   Patient continues to weigh daily ,weights staying in the 350- 352 range no sudden increases greater than 3 pounds in a day or 5 in a week.   Patient discussed some decrease in appetite, eating at least 2 meals of day, not snacking, drinking adequate water daily and limiting salt in diet.   Parkinson's/Addison Disease  Discussed recent visit to [name] in [place] for assessment of equipment needs ,representative has visited home for ramp estimate.  Patient has follow up visit on 2/5 for scooter assessment. Patient discussed initial visit with Endocrinologist at [name] with through exam and follow up planned. Patient states that plans to continue to attend visits at [name] with  eventual plan transition to [name].   Patient discussed home health physical therapy and occupational therapy has visited at home.   Palliative care consult is in place and patient reports initial visit planned for next week.   Patient denies any new concerns at this time.  Plan  Will schedule transition of care call in the next week.  Dennis Draft, RN, [place] Management Coordinator  [phone]- Mobile [phone]- Toll Free Main Office

## 2017-07-20 NOTE — Telephone Encounter (Signed)
Palliative Care form filled out by Clarene Reamer and faxed back to [place].

## 2017-07-22 IMAGING — MR MR THORACIC SPINE W/O CM
4 of 10 series · 19 of 48 positions shown · non-contrast
Comparison: None.

CLINICAL DATA: Back pain, thoracic, that has gradually worsened for
3 weeks.

EXAM:
MRI THORACIC SPINE WITHOUT CONTRAST
TECHNIQUE: Multiplanar, multisequence MR imaging of the thoracic spine was
performed. No intravenous contrast was administered.
Intravenous contrast was ordered but could not be administered due
to mechanical failure from severe Silinne Choisil.

[Series 2: T1 · sagittal · 3.0mm · 0.90mm/px · 4 of 16 slices shown]
[im 1/16]
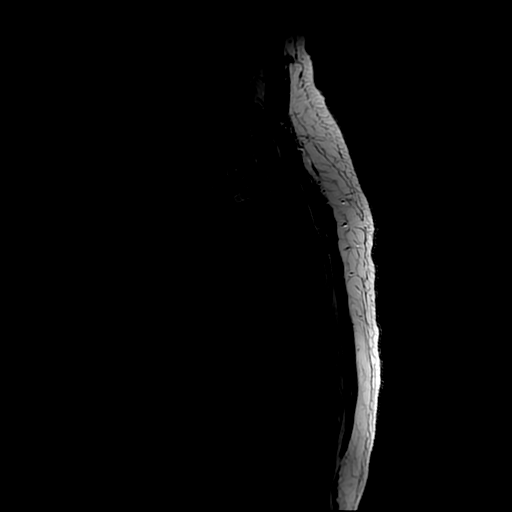
[im 6/16]
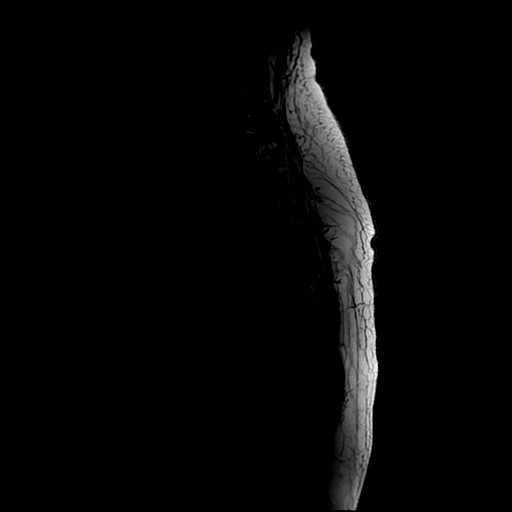
[im 11/16]
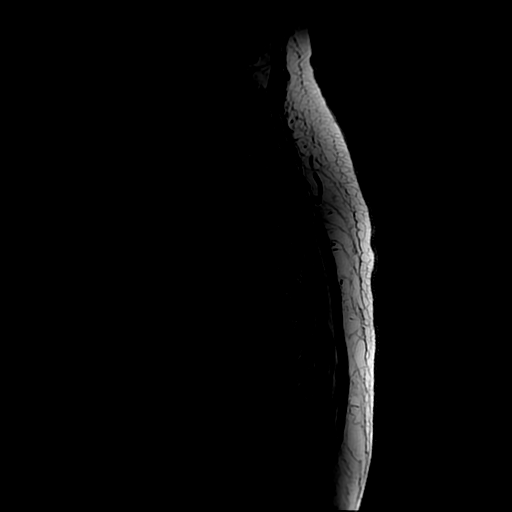
[im 16/16]
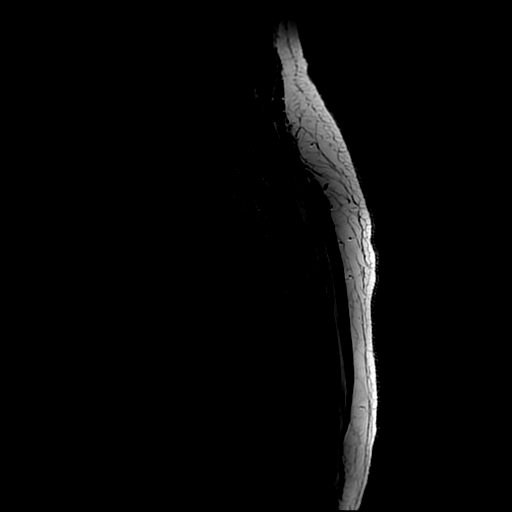

[Series 6: T2 · axial · 4.0mm · 0.39mm/px · z∈[-143,+16]mm · 7 of 30 slices shown (1 of 3)]
[im 1/30]
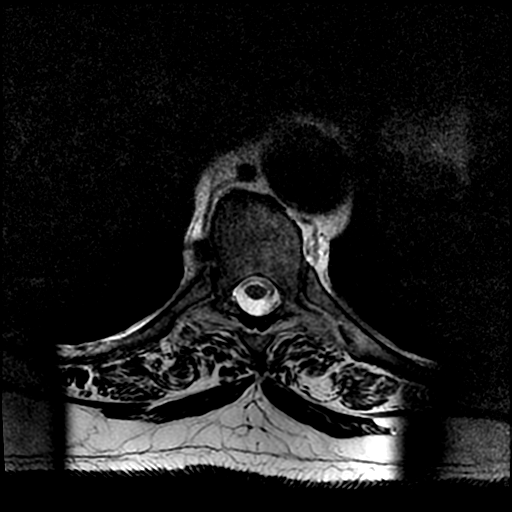
[im 5/30]
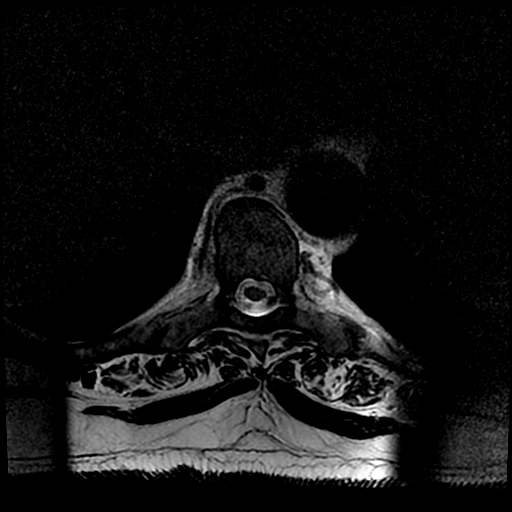
[im 10/30]
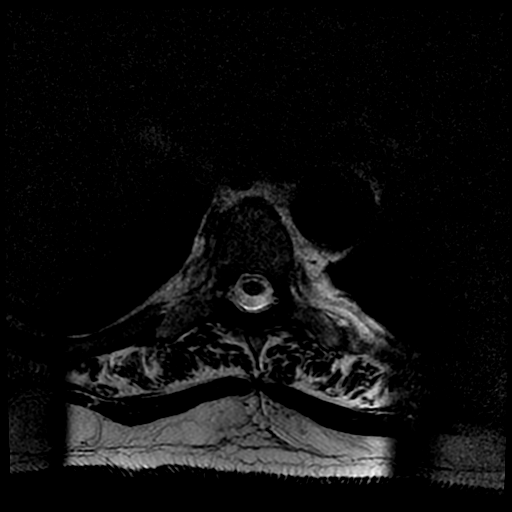
[im 15/30]
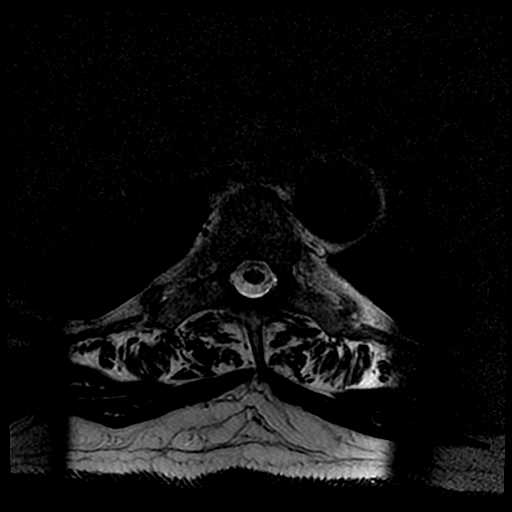
[im 20/30]
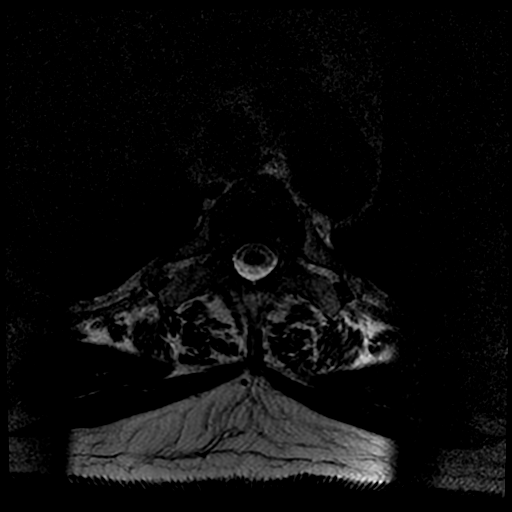
[im 25/30]
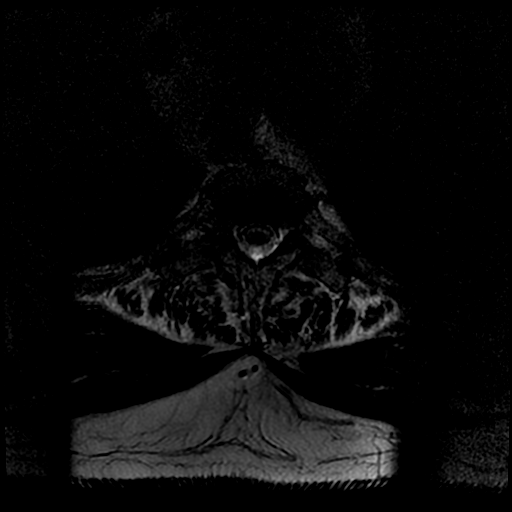
[im 30/30]
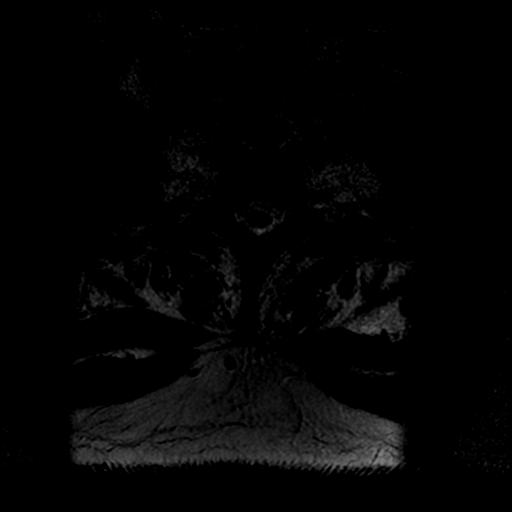

[Series 8: T2 · axial · 4.0mm · 0.39mm/px · z∈[-262,-132]mm · 5 of 25 slices shown (2 of 3)]
[im 1/25]
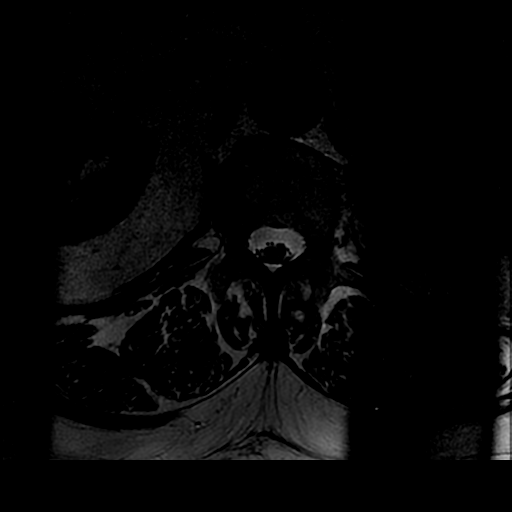
[im 7/25]
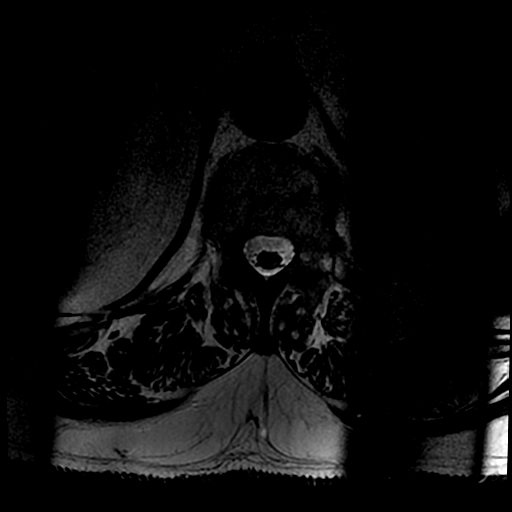
[im 13/25]
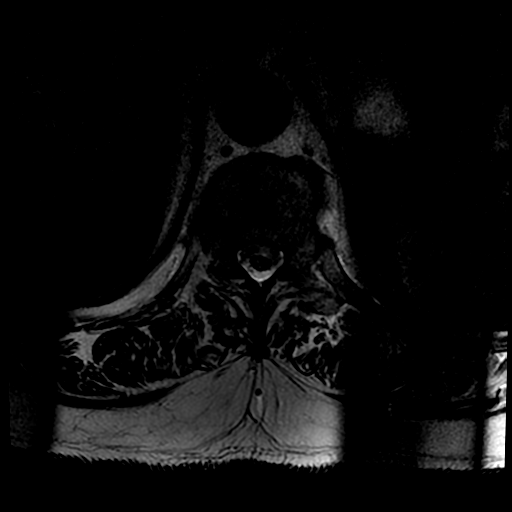
[im 19/25]
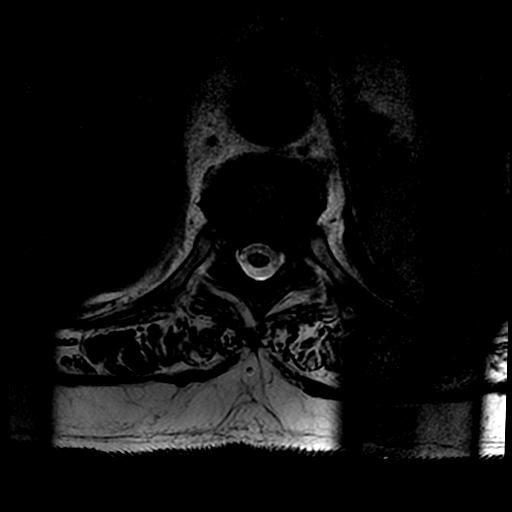
[im 25/25]
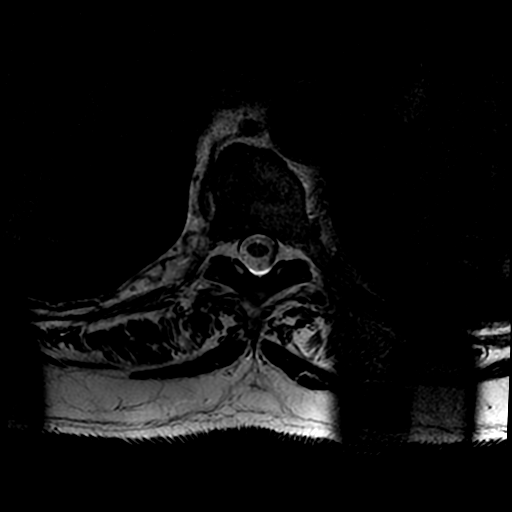

[Series 10: T2 · sagittal · 3.0mm · 0.66mm/px · 3 of 15 slices shown (3 of 3)]
[im 1/15]
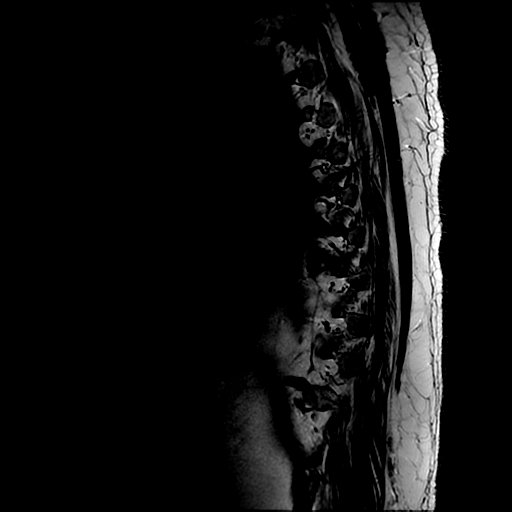
[im 8/15]
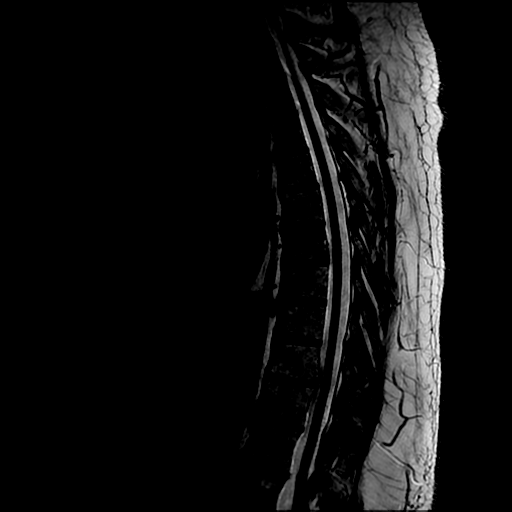
[im 15/15]
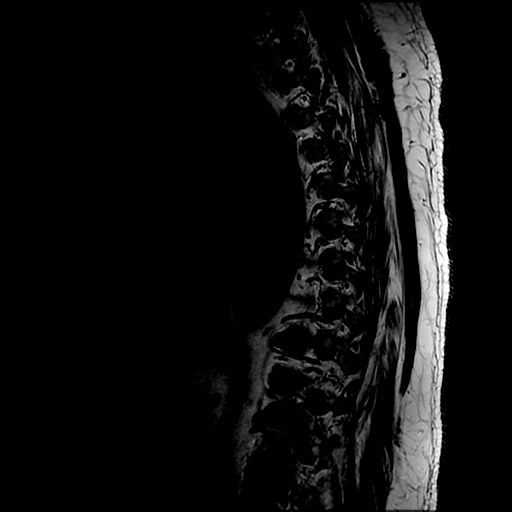

[19 of 48 positions shown; findings below may reference images not displayed]

FINDINGS: Alignment:  Physiologic.

Vertebrae: No fracture, evidence of discitis, or aggressive bone
lesion. Small incidental T10 hemangioma

Cord:  Normal signal and morphology.

Paraspinal and other soft tissues: Negative

Disc levels:

Lower thoracic spondylosis. Mild generalized disc narrowing and
desiccation. Mild ligamentum flavum thickening most notable at T1-2
and T3-4.

T7-8: Left paracentral disc protrusion and ridge without
impingement.

T10-11: Left paracentral disc protrusion and ridging contacts the
left aspect of the cord without flattening.

T11-12: Left-sided facet spurring causes moderate left foraminal
stenosis.
IMPRESSION: 1. No acute finding, including fracture or evidence of infection.
2. Noncompressive disc herniations at T7-8 and T10-11.
3. Moderate left foraminal narrowing at T11-12 due to facet
spurring.
4. Postcontrast images could not be obtained due to scanner hardware
failure.

## 2017-07-24 DIAGNOSIS — G20 Parkinson's disease: Secondary | ICD-10-CM | POA: Diagnosis not present

## 2017-07-24 DIAGNOSIS — I5032 Chronic diastolic (congestive) heart failure: Secondary | ICD-10-CM | POA: Diagnosis not present

## 2017-07-24 DIAGNOSIS — G8929 Other chronic pain: Secondary | ICD-10-CM | POA: Diagnosis not present

## 2017-07-24 DIAGNOSIS — M545 Low back pain: Secondary | ICD-10-CM | POA: Diagnosis not present

## 2017-07-24 DIAGNOSIS — I110 Hypertensive heart disease with heart failure: Secondary | ICD-10-CM | POA: Diagnosis not present

## 2017-07-24 DIAGNOSIS — I2510 Atherosclerotic heart disease of native coronary artery without angina pectoris: Secondary | ICD-10-CM | POA: Diagnosis not present

## 2017-07-25 NOTE — Progress Notes (Signed)
Carelink Summary Report / Loop Recorder 

## 2017-07-26 DIAGNOSIS — Z515 Encounter for palliative care: Secondary | ICD-10-CM | POA: Diagnosis not present

## 2017-07-26 DIAGNOSIS — G20 Parkinson's disease: Secondary | ICD-10-CM | POA: Diagnosis not present

## 2017-07-26 DIAGNOSIS — R531 Weakness: Secondary | ICD-10-CM | POA: Diagnosis not present

## 2017-07-26 DIAGNOSIS — Z8673 Personal history of transient ischemic attack (TIA), and cerebral infarction without residual deficits: Secondary | ICD-10-CM | POA: Diagnosis not present

## 2017-07-26 DIAGNOSIS — E271 Primary adrenocortical insufficiency: Secondary | ICD-10-CM | POA: Diagnosis not present

## 2017-07-26 DIAGNOSIS — R251 Tremor, unspecified: Secondary | ICD-10-CM | POA: Diagnosis not present

## 2017-07-26 DIAGNOSIS — R630 Anorexia: Secondary | ICD-10-CM | POA: Diagnosis not present

## 2017-07-27 ENCOUNTER — Other Ambulatory Visit: Payer: Self-pay | Admitting: *Deleted

## 2017-07-27 DIAGNOSIS — I5032 Chronic diastolic (congestive) heart failure: Secondary | ICD-10-CM | POA: Diagnosis not present

## 2017-07-27 DIAGNOSIS — I2510 Atherosclerotic heart disease of native coronary artery without angina pectoris: Secondary | ICD-10-CM | POA: Diagnosis not present

## 2017-07-27 DIAGNOSIS — G8929 Other chronic pain: Secondary | ICD-10-CM | POA: Diagnosis not present

## 2017-07-27 DIAGNOSIS — I110 Hypertensive heart disease with heart failure: Secondary | ICD-10-CM | POA: Diagnosis not present

## 2017-07-27 DIAGNOSIS — G20 Parkinson's disease: Secondary | ICD-10-CM | POA: Diagnosis not present

## 2017-07-27 DIAGNOSIS — M545 Low back pain: Secondary | ICD-10-CM | POA: Diagnosis not present

## 2017-07-27 NOTE — Patient Outreach (Signed)
[facility]) Care Management  07/27/2017  Dennis Lassen Lemke Sr. 1945-12-09 [id number]  Transition of care call  72 year old male with [facility] admission 11/27- 11/29, Dx: Cellulitis right lower extremity 12/3 Readmission to [facility] for Right leg cellulitis, D/C on 12/6 to [facility] SNF for short term rehab,and IV Antibiotics. 12/17 Readmitted to [facility] cone with new CVA. 12/19 Discharged to Home, with home health services. PMHx: includes bu not limited to , Parkinson's , obstructive sleep apnea, Addison Disease, CVA, history of agent orange exposure.   Successful outreach call to patient, HIPAA identifiers confirmed . Patient discussed feeling a little shaky this morning stating his blood pressure was 116/70, and he feels better when it is in the range of 140/70-80.  Patient discussed home health therapy visit on today and feeling some better afterwards and blood pressure up to 134/80. Patient denies any new stroke symptoms concerns when reviewed. Patient reports his weight is at 350 on today, denies any increase in shortness of breath, no swelling. Patient reports continues to take medication as prescribed.  Patient states his appetite is still not good, but focusing on making sure he gets protein in . Patient drinks at least one ensure daily and sometimes 2 in between meal times. Discussed other option of boost nutrition drink as supplement as patient recalls drinking another substance while in rehab.   Patient discussed initial Palliative care home visit on this week and believes he is enrolled in program.  Discussed with patient once enrolled in Palliative care program they will be managing his care, and [facility] care management will be duplication on services patient verbalized understanding. Patient discussed his upcoming medical appointments in next week with Dr.Kumar and Cardiology.  Plan Will plan final transition of care call in the next week.  Will verify patient is  enrolled in Palliative care program of [place], RN, [place] Management Coordinator  [phone]- Mobile [phone]- [place] Office

## 2017-07-31 DIAGNOSIS — I5032 Chronic diastolic (congestive) heart failure: Secondary | ICD-10-CM | POA: Diagnosis not present

## 2017-07-31 DIAGNOSIS — G20 Parkinson's disease: Secondary | ICD-10-CM | POA: Diagnosis not present

## 2017-07-31 DIAGNOSIS — I110 Hypertensive heart disease with heart failure: Secondary | ICD-10-CM | POA: Diagnosis not present

## 2017-07-31 DIAGNOSIS — M545 Low back pain: Secondary | ICD-10-CM | POA: Diagnosis not present

## 2017-07-31 DIAGNOSIS — G8929 Other chronic pain: Secondary | ICD-10-CM | POA: Diagnosis not present

## 2017-07-31 DIAGNOSIS — I2510 Atherosclerotic heart disease of native coronary artery without angina pectoris: Secondary | ICD-10-CM | POA: Diagnosis not present

## 2017-08-01 ENCOUNTER — Encounter (HOSPITAL_COMMUNITY): Payer: Self-pay | Admitting: Nurse Practitioner

## 2017-08-01 ENCOUNTER — Ambulatory Visit (HOSPITAL_COMMUNITY)
Admission: RE | Admit: 2017-08-01 | Discharge: 2017-08-01 | Disposition: A | Discharge status: Home / Self Care | Payer: Medicare Other | Source: Ambulatory Visit | Attending: Nurse Practitioner | Admitting: Nurse Practitioner

## 2017-08-01 ENCOUNTER — Other Ambulatory Visit (INDEPENDENT_AMBULATORY_CARE_PROVIDER_SITE_OTHER): Payer: Medicare Other

## 2017-08-01 VITALS — BP 140/82 | HR 96 | Ht 77.0 in | Wt 357.2 lb

## 2017-08-01 DIAGNOSIS — G4733 Obstructive sleep apnea (adult) (pediatric): Secondary | ICD-10-CM | POA: Insufficient documentation

## 2017-08-01 DIAGNOSIS — E039 Hypothyroidism, unspecified: Secondary | ICD-10-CM | POA: Insufficient documentation

## 2017-08-01 DIAGNOSIS — Z833 Family history of diabetes mellitus: Secondary | ICD-10-CM | POA: Diagnosis not present

## 2017-08-01 DIAGNOSIS — Z888 Allergy status to other drugs, medicaments and biological substances status: Secondary | ICD-10-CM | POA: Diagnosis not present

## 2017-08-01 DIAGNOSIS — Z8673 Personal history of transient ischemic attack (TIA), and cerebral infarction without residual deficits: Secondary | ICD-10-CM | POA: Insufficient documentation

## 2017-08-01 DIAGNOSIS — Z823 Family history of stroke: Secondary | ICD-10-CM | POA: Insufficient documentation

## 2017-08-01 DIAGNOSIS — E2740 Unspecified adrenocortical insufficiency: Secondary | ICD-10-CM

## 2017-08-01 DIAGNOSIS — Z91030 Bee allergy status: Secondary | ICD-10-CM | POA: Insufficient documentation

## 2017-08-01 DIAGNOSIS — Z87442 Personal history of urinary calculi: Secondary | ICD-10-CM | POA: Diagnosis not present

## 2017-08-01 DIAGNOSIS — Z9889 Other specified postprocedural states: Secondary | ICD-10-CM | POA: Diagnosis not present

## 2017-08-01 DIAGNOSIS — Z8249 Family history of ischemic heart disease and other diseases of the circulatory system: Secondary | ICD-10-CM | POA: Insufficient documentation

## 2017-08-01 DIAGNOSIS — Z981 Arthrodesis status: Secondary | ICD-10-CM | POA: Insufficient documentation

## 2017-08-01 DIAGNOSIS — I480 Paroxysmal atrial fibrillation: Secondary | ICD-10-CM | POA: Diagnosis not present

## 2017-08-01 DIAGNOSIS — I252 Old myocardial infarction: Secondary | ICD-10-CM | POA: Diagnosis not present

## 2017-08-01 DIAGNOSIS — Z8547 Personal history of malignant neoplasm of testis: Secondary | ICD-10-CM | POA: Insufficient documentation

## 2017-08-01 DIAGNOSIS — Z8701 Personal history of pneumonia (recurrent): Secondary | ICD-10-CM | POA: Insufficient documentation

## 2017-08-01 DIAGNOSIS — Z96652 Presence of left artificial knee joint: Secondary | ICD-10-CM | POA: Insufficient documentation

## 2017-08-01 DIAGNOSIS — Z7901 Long term (current) use of anticoagulants: Secondary | ICD-10-CM | POA: Diagnosis not present

## 2017-08-01 DIAGNOSIS — M109 Gout, unspecified: Secondary | ICD-10-CM | POA: Insufficient documentation

## 2017-08-01 DIAGNOSIS — I509 Heart failure, unspecified: Secondary | ICD-10-CM | POA: Diagnosis not present

## 2017-08-01 DIAGNOSIS — I2510 Atherosclerotic heart disease of native coronary artery without angina pectoris: Secondary | ICD-10-CM | POA: Diagnosis not present

## 2017-08-01 DIAGNOSIS — Z8049 Family history of malignant neoplasm of other genital organs: Secondary | ICD-10-CM | POA: Insufficient documentation

## 2017-08-01 DIAGNOSIS — I110 Hypertensive heart disease with heart failure: Secondary | ICD-10-CM | POA: Diagnosis not present

## 2017-08-01 DIAGNOSIS — Z7902 Long term (current) use of antithrombotics/antiplatelets: Secondary | ICD-10-CM | POA: Diagnosis not present

## 2017-08-01 DIAGNOSIS — Z79899 Other long term (current) drug therapy: Secondary | ICD-10-CM | POA: Diagnosis not present

## 2017-08-01 DIAGNOSIS — Z820 Family history of epilepsy and other diseases of the nervous system: Secondary | ICD-10-CM | POA: Insufficient documentation

## 2017-08-01 DIAGNOSIS — Z91013 Allergy to seafood: Secondary | ICD-10-CM | POA: Diagnosis not present

## 2017-08-01 LAB — CUP PACEART REMOTE DEVICE CHECK
Date Time Interrogation Session: 20190111031348
Implantable Pulse Generator Implant Date: 20170719

## 2017-08-01 LAB — BASIC METABOLIC PANEL
BUN: 14 mg/dL (ref 6–23)
CO2: 30 mEq/L (ref 19–32)
Calcium: 9.8 mg/dL (ref 8.4–10.5)
Chloride: 101 mEq/L (ref 96–112)
Creatinine, Ser: 1.08 mg/dL (ref 0.40–1.50)
GFR: 71.47 mL/min (ref >60.00)
Glucose, Bld: 108 mg/dL — ABNORMAL HIGH (ref 70–99)
Potassium: 4 mEq/L (ref 3.5–5.1)
Sodium: 138 mEq/L (ref 135–145)

## 2017-08-01 LAB — CBC
HCT: 51 % (ref 39.0–52.0)
Hemoglobin: 16.1 g/dL (ref 13.0–17.0)
MCH: 26.2 pg (ref 26.0–34.0)
MCHC: 31.6 g/dL (ref 30.0–36.0)
MCV: 83.1 fL (ref 78.0–100.0)
Platelets: 158 10*3/uL (ref 150–400)
RBC: 6.14 MIL/uL — ABNORMAL HIGH (ref 4.22–5.81)
RDW: 14.9 % (ref 11.5–15.5)
WBC: 6.3 10*3/uL (ref 4.0–10.5)

## 2017-08-01 LAB — TSH: TSH: 0.17 u[IU]/mL — ABNORMAL LOW (ref 0.35–4.50)

## 2017-08-01 LAB — T4, FREE: Free T4: 1.18 ng/dL (ref 0.60–1.60)
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
## 2017-08-02 ENCOUNTER — Ambulatory Visit (INDEPENDENT_AMBULATORY_CARE_PROVIDER_SITE_OTHER): Payer: Medicare Other | Admitting: Family Medicine

## 2017-08-02 ENCOUNTER — Encounter: Payer: Self-pay | Admitting: Family Medicine

## 2017-08-02 VITALS — BP 118/72 | HR 84 | Temp 97.7°F | Wt 362.8 lb

## 2017-08-02 DIAGNOSIS — I5032 Chronic diastolic (congestive) heart failure: Secondary | ICD-10-CM | POA: Diagnosis not present

## 2017-08-02 DIAGNOSIS — I639 Cerebral infarction, unspecified: Secondary | ICD-10-CM | POA: Diagnosis not present

## 2017-08-02 DIAGNOSIS — G8929 Other chronic pain: Secondary | ICD-10-CM | POA: Diagnosis not present

## 2017-08-02 DIAGNOSIS — R531 Weakness: Secondary | ICD-10-CM | POA: Diagnosis not present

## 2017-08-02 DIAGNOSIS — R5382 Chronic fatigue, unspecified: Secondary | ICD-10-CM

## 2017-08-02 DIAGNOSIS — I2510 Atherosclerotic heart disease of native coronary artery without angina pectoris: Secondary | ICD-10-CM | POA: Diagnosis not present

## 2017-08-02 DIAGNOSIS — G20A1 Parkinson's disease without dyskinesia, without mention of fluctuations: Secondary | ICD-10-CM

## 2017-08-02 DIAGNOSIS — M545 Low back pain: Secondary | ICD-10-CM | POA: Diagnosis not present

## 2017-08-02 DIAGNOSIS — G20 Parkinson's disease: Secondary | ICD-10-CM | POA: Diagnosis not present

## 2017-08-02 DIAGNOSIS — I110 Hypertensive heart disease with heart failure: Secondary | ICD-10-CM | POA: Diagnosis not present
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
## 2017-08-03 ENCOUNTER — Encounter: Payer: Self-pay | Admitting: Family Medicine

## 2017-08-03 ENCOUNTER — Other Ambulatory Visit: Payer: Self-pay | Admitting: *Deleted

## 2017-08-03 NOTE — Patient Outreach (Addendum)
[facility]) Care Management  08/03/2017  Dennis Felch Heacock Sr. 1946-01-28 [id number]   Final Transition of care call  72 year old male with [facility] admission 11/27- 11/29, Dx: Cellulitis right lower extremity 12/3 Readmission to [facility] for Right leg cellulitis, D/C on 12/6 to [facility] for short term rehab,and IV Antibiotics. 12/17 [facility] to [facility] with new CVA. 12/19 Discharged to Home, with home health services. PMHx: includes bu not limited to , Parkinson's , obstructive sleepapnea, Addison Disease, CVA, history of agent orange exposure.  Unsuccessful telephone outreach for final transition of care call, able to leave a HIPAA compliant telephone message for return call.  Placed care coordination call to [facility]iative[facility]osp[facility] care of [place] and [place] , spoke with [facility], verified patient is active with palliative care services, initial home visit in the last week and next follow up in February.   Addendum  1530 Incoming call from patient, HIPAA information verified. Patient reports this is a good day . He discussed making good progress with home physical therapy in increasing endurance and has new orders to continue follow up for another 4 weeks.  Patient continues to work through the [place] getting needed equipment, ramp, scooter .    Patient continues to monitor his blood pressure twice daily and keep a record at home, reports when blood pressures less than 130's he feels weaker .   Patient discussed recent visits to PCP on yesterday and to [place] in [place]  today for bone density test, has follow up with Dr.Kumar on tomorrow, and ongoing follow up at [facility] regarding Parkinson.    Discussed with patient since he is active with Palliative care of [place] they are helping with coordinating care and [facility] care management services will be duplicate, he verbalized understanding and agreement of case closure. Discussed with patient if future needs change to  contact us verified he has contact information . Patient is with his wife, asked with any further questions or concerns , she states "we are good".   Plan  Will plan case closure , patient enrolled in an  external program, palliative care of [place].  Will notify Eduard Clos, LCSW  of closure of nursing program and notify CMA when case closure.  Will send MD case closure letter.    Joylene Draft, RN, [place] Management Coordinator  [phone]- Mobile [phone]- Toll Free Main Office

## 2017-08-04 ENCOUNTER — Other Ambulatory Visit: Payer: Self-pay | Admitting: Endocrinology

## 2017-08-04 ENCOUNTER — Encounter: Payer: Self-pay | Admitting: Endocrinology

## 2017-08-04 ENCOUNTER — Ambulatory Visit: Payer: Medicare Other | Admitting: Endocrinology

## 2017-08-04 ENCOUNTER — Ambulatory Visit (INDEPENDENT_AMBULATORY_CARE_PROVIDER_SITE_OTHER): Payer: Medicare Other | Admitting: Endocrinology

## 2017-08-04 VITALS — BP 142/82 | HR 79 | Ht 77.0 in | Wt 358.0 lb

## 2017-08-04 DIAGNOSIS — E039 Hypothyroidism, unspecified: Secondary | ICD-10-CM | POA: Diagnosis not present

## 2017-08-04 DIAGNOSIS — E2740 Unspecified adrenocortical insufficiency: Secondary | ICD-10-CM | POA: Diagnosis not present

## 2017-08-04 DIAGNOSIS — G903 Multi-system degeneration of the autonomic nervous system: Secondary | ICD-10-CM

## 2017-08-04 DIAGNOSIS — E230 Hypopituitarism: Secondary | ICD-10-CM | POA: Insufficient documentation

## 2017-08-04 DIAGNOSIS — I639 Cerebral infarction, unspecified: Secondary | ICD-10-CM | POA: Diagnosis not present

## 2017-08-04 DIAGNOSIS — I951 Orthostatic hypotension: Secondary | ICD-10-CM | POA: Insufficient documentation

## 2017-08-04 MED ORDER — MIDODRINE HCL 5 MG PO TABS: 5.0000 mg | ORAL_TABLET | Freq: Two times a day (BID) | ORAL | 1 refills | Status: DC

## 2017-08-04 NOTE — Patient Instructions (Signed)
Skip Synthroid on saturdays
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
## 2017-08-05 IMAGING — XA DG INJECT/[PERSON_NAME] INC NEEDLE/CATH/PLC EPI/CERV/THOR W/IMG
3 series · 3 of 3 positions shown · non-contrast
Comparison: none

CLINICAL DATA: Thoracic spondylosis. LEFT-sided pain. Disc
extrusion at T10-11.

[Series 2: ortho standard · 1 of 1 slices shown (1 of 3)]
[im 1/1]
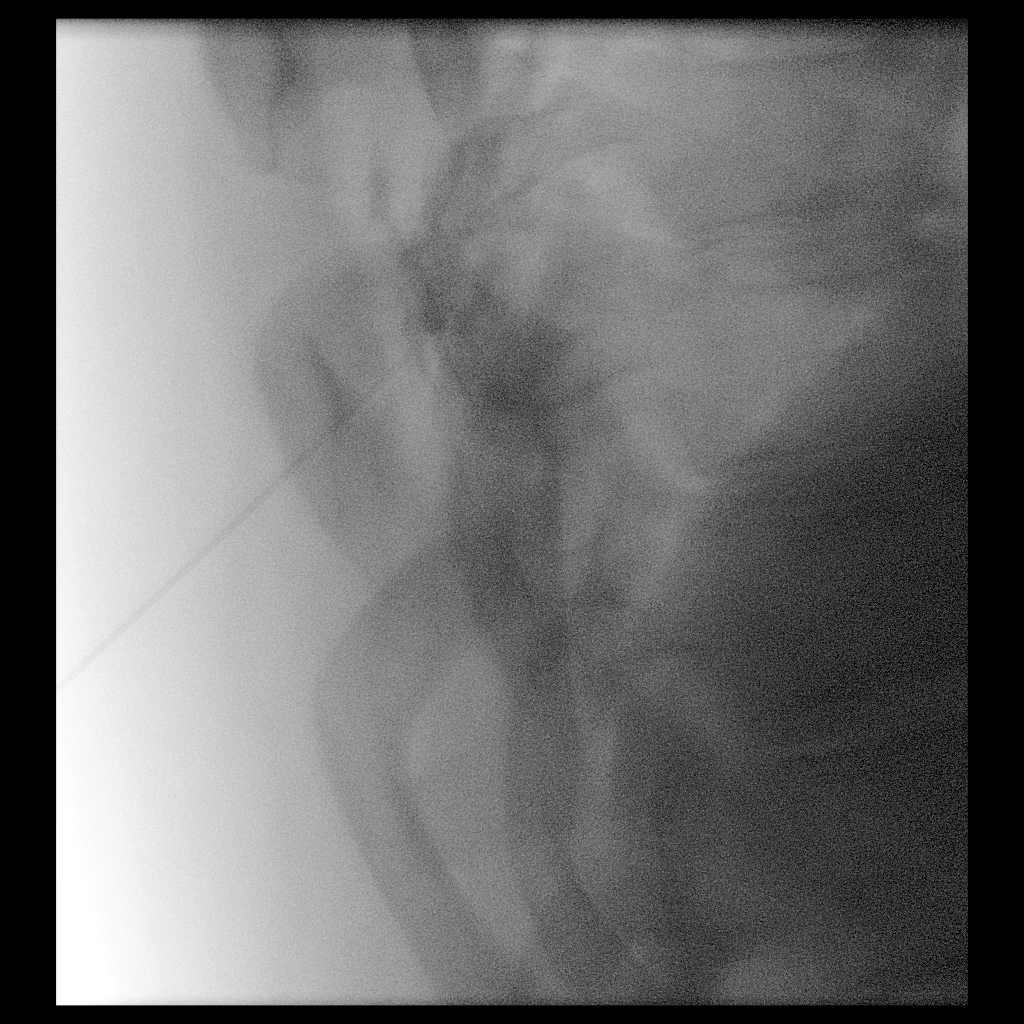

[Series 3: ortho standard · 1 of 1 slices shown (2 of 3)]
[im 1/1]
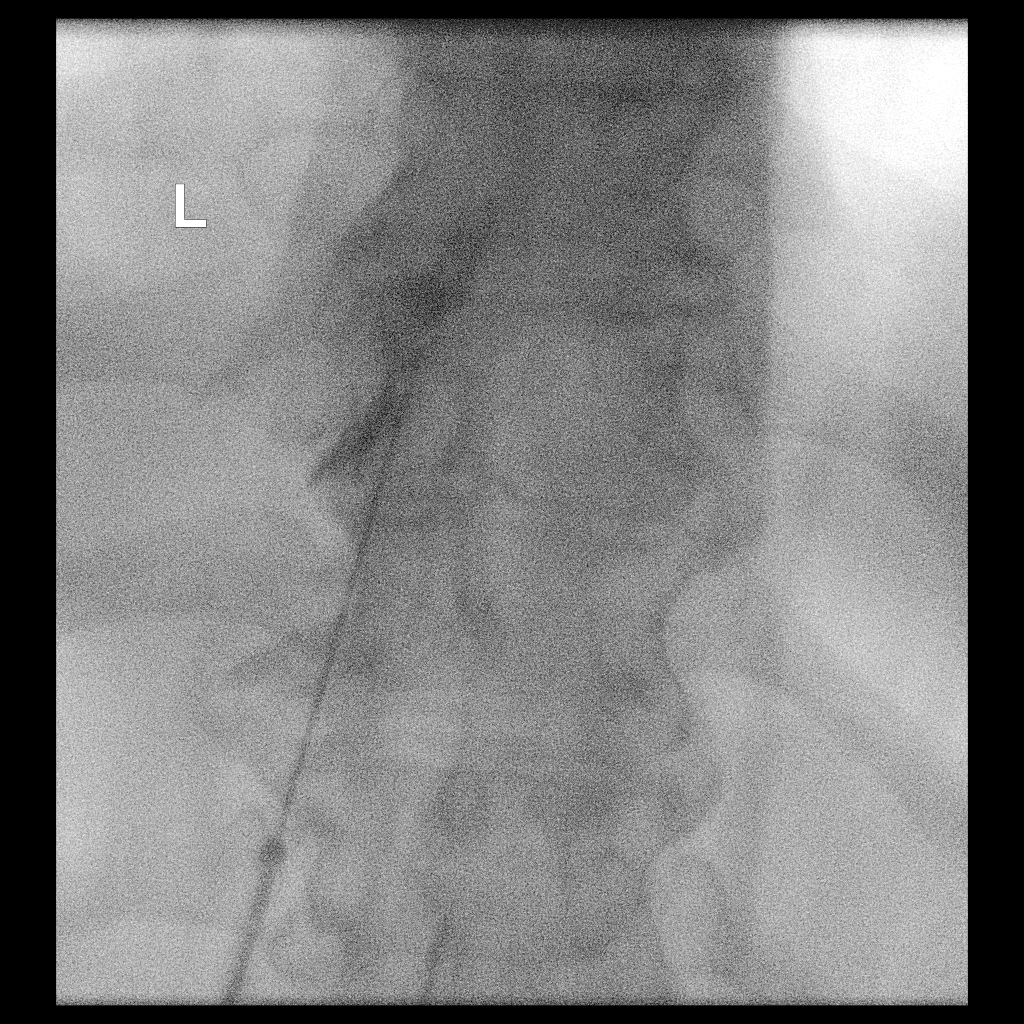

[Series 4: ortho standard · 1 of 1 slices shown (3 of 3)]
[im 1/1]
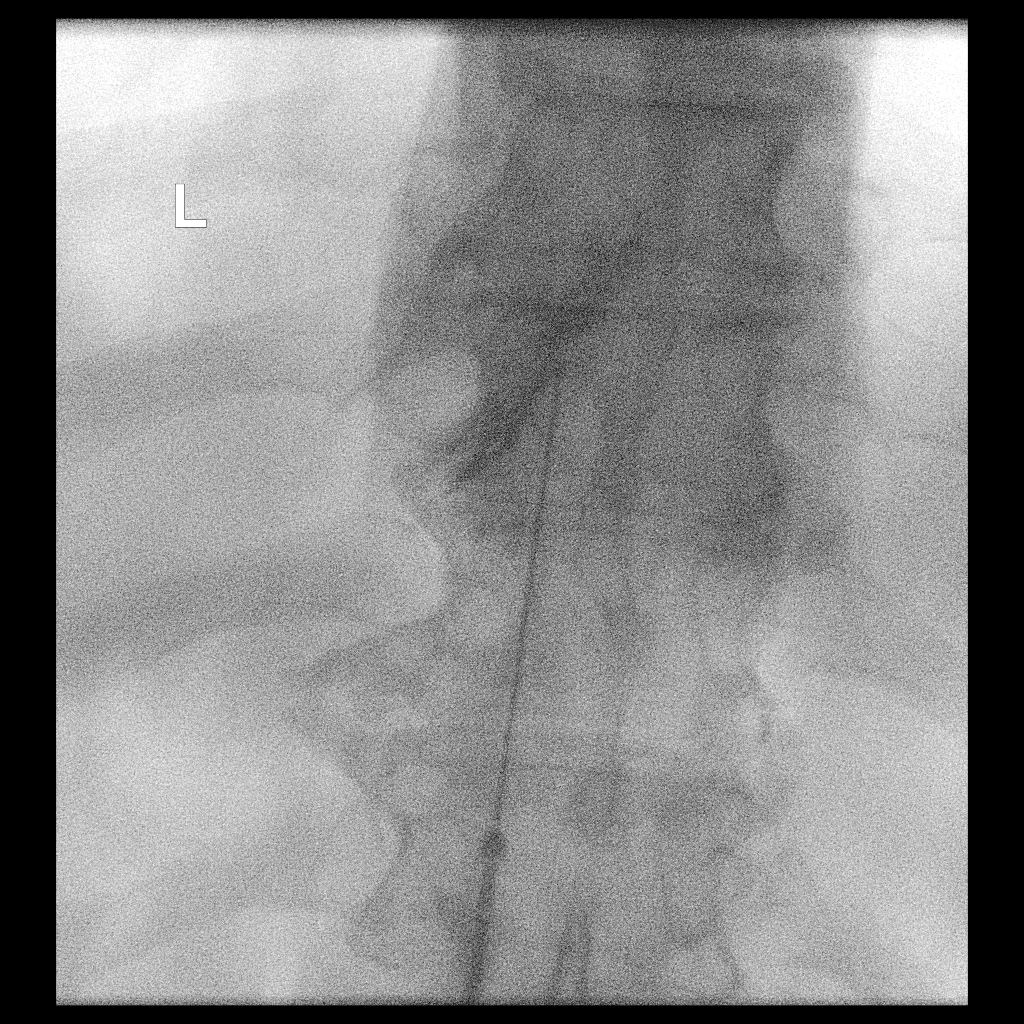

[3 of 3 positions shown; findings below may reference images not displayed]

FLUOROSCOPY TIME:  42 seconds corresponding to a Dose Area Product
of 113.5 ?Gy*m2

PROCEDURE:
Informed written consent was obtained. Time-out was performed. An
appropriate skin entry site was chosen, cleansed with Betadine, and
anesthetized with 1% lidocaine.

THORACIC EPIDURAL INJECTION

An interlaminar approach was performed on the LEFT at T10-T11 . A 20
gauge epidural needle was advanced using loss-of-resistance
technique.

DIAGNOSTIC EPIDURAL INJECTION

Injection of Isovue-M 300 initially showed extra-spinal spread, but
with advancement, the epidural space was identified. Image quality
was poor due to the patient's body habitus (371 pounds). Injection
showed an epidural pattern with spread above and below the level, on
the LEFT. No vascular opacification is seen.

THERAPEUTIC EPIDURAL INJECTION

1.5 ml of Kenalog 40 mixed with 1 ml of 1% Lidocaine and 2 ml of
normal saline were then instilled. The procedure was well-tolerated,
and the patient was discharged thirty minutes following the
injection in good condition.
IMPRESSION: Technically successful first lower thoracic epidural injection on
the LEFT at T10-T11..

## 2017-08-07 ENCOUNTER — Other Ambulatory Visit: Payer: Self-pay | Admitting: *Deleted

## 2017-08-07 NOTE — Patient Outreach (Signed)
[facility]) Care Management  08/07/2017  Hester Forget Salim Sr. 1946/06/07 [id number]  [facility] CSW is closing CSW referral at this time. Patient is to be followed by Palliative team per [place]. CSW has been unable to reach wife by phone- will send letter of closure. Please reconsult if needs arise. CSW will advise [facility] team and PCP. Patient and wife aware per [facility] RNCM.   Eduard Clos, MSW, [place] Worker  [place] [phone]

## 2017-08-10 DIAGNOSIS — I5032 Chronic diastolic (congestive) heart failure: Secondary | ICD-10-CM | POA: Diagnosis not present

## 2017-08-10 DIAGNOSIS — I2510 Atherosclerotic heart disease of native coronary artery without angina pectoris: Secondary | ICD-10-CM | POA: Diagnosis not present

## 2017-08-10 DIAGNOSIS — I110 Hypertensive heart disease with heart failure: Secondary | ICD-10-CM | POA: Diagnosis not present

## 2017-08-10 DIAGNOSIS — M545 Low back pain: Secondary | ICD-10-CM | POA: Diagnosis not present

## 2017-08-10 DIAGNOSIS — G20 Parkinson's disease: Secondary | ICD-10-CM | POA: Diagnosis not present

## 2017-08-10 DIAGNOSIS — G8929 Other chronic pain: Secondary | ICD-10-CM | POA: Diagnosis not present

## 2017-08-11 ENCOUNTER — Ambulatory Visit: Payer: Medicare Other | Admitting: Nurse Practitioner
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
## 2017-08-14 ENCOUNTER — Encounter: Payer: Self-pay | Admitting: Physician Assistant

## 2017-08-14 ENCOUNTER — Ambulatory Visit (INDEPENDENT_AMBULATORY_CARE_PROVIDER_SITE_OTHER): Payer: Medicare Other | Admitting: Physician Assistant

## 2017-08-14 VITALS — BP 120/60 | HR 85 | Ht 77.0 in | Wt 349.0 lb

## 2017-08-14 DIAGNOSIS — I5032 Chronic diastolic (congestive) heart failure: Secondary | ICD-10-CM

## 2017-08-14 DIAGNOSIS — I480 Paroxysmal atrial fibrillation: Secondary | ICD-10-CM | POA: Diagnosis not present

## 2017-08-14 DIAGNOSIS — I2510 Atherosclerotic heart disease of native coronary artery without angina pectoris: Secondary | ICD-10-CM

## 2017-08-14 DIAGNOSIS — I10 Essential (primary) hypertension: Secondary | ICD-10-CM | POA: Diagnosis not present

## 2017-08-14 DIAGNOSIS — I639 Cerebral infarction, unspecified: Secondary | ICD-10-CM

## 2017-08-14 NOTE — Patient Instructions (Addendum)
Medication Instructions:  No changes  Labwork: None   Testing/Procedures: None   Follow-Up: Dr. Lauree Chandler in 6 months. We will send out a reminder letter in a few months   Any Other Special Instructions Will Be Listed Below (If Applicable).  If you need a refill on your cardiac medications before your next appointment, please call your pharmacy.

## 2017-08-16 DIAGNOSIS — G20 Parkinson's disease: Secondary | ICD-10-CM | POA: Diagnosis not present

## 2017-08-16 DIAGNOSIS — I5032 Chronic diastolic (congestive) heart failure: Secondary | ICD-10-CM | POA: Diagnosis not present

## 2017-08-16 DIAGNOSIS — I110 Hypertensive heart disease with heart failure: Secondary | ICD-10-CM | POA: Diagnosis not present

## 2017-08-16 DIAGNOSIS — I2510 Atherosclerotic heart disease of native coronary artery without angina pectoris: Secondary | ICD-10-CM | POA: Diagnosis not present

## 2017-08-16 DIAGNOSIS — M545 Low back pain: Secondary | ICD-10-CM | POA: Diagnosis not present

## 2017-08-16 DIAGNOSIS — G8929 Other chronic pain: Secondary | ICD-10-CM | POA: Diagnosis not present

## 2017-08-18 DIAGNOSIS — I110 Hypertensive heart disease with heart failure: Secondary | ICD-10-CM | POA: Diagnosis not present

## 2017-08-18 DIAGNOSIS — M545 Low back pain: Secondary | ICD-10-CM | POA: Diagnosis not present

## 2017-08-18 DIAGNOSIS — I2510 Atherosclerotic heart disease of native coronary artery without angina pectoris: Secondary | ICD-10-CM | POA: Diagnosis not present

## 2017-08-18 DIAGNOSIS — G20 Parkinson's disease: Secondary | ICD-10-CM | POA: Diagnosis not present

## 2017-08-18 DIAGNOSIS — I5032 Chronic diastolic (congestive) heart failure: Secondary | ICD-10-CM | POA: Diagnosis not present

## 2017-08-18 DIAGNOSIS — G8929 Other chronic pain: Secondary | ICD-10-CM | POA: Diagnosis not present

## 2017-08-21 ENCOUNTER — Ambulatory Visit (INDEPENDENT_AMBULATORY_CARE_PROVIDER_SITE_OTHER): Payer: Medicare Other | Admitting: *Deleted

## 2017-08-21 ENCOUNTER — Telehealth: Payer: Self-pay | Admitting: Cardiology

## 2017-08-21 DIAGNOSIS — I639 Cerebral infarction, unspecified: Secondary | ICD-10-CM | POA: Diagnosis not present

## 2017-08-21 NOTE — Progress Notes (Signed)
Carelink Summary Report / Loop Recorder 

## 2017-08-21 NOTE — Telephone Encounter (Signed)
Spoke with pt and reminded pt of remote transmission that is due today. Pt verbalized understanding.   

## 2017-08-22 DIAGNOSIS — I110 Hypertensive heart disease with heart failure: Secondary | ICD-10-CM | POA: Diagnosis not present

## 2017-08-22 DIAGNOSIS — I5032 Chronic diastolic (congestive) heart failure: Secondary | ICD-10-CM | POA: Diagnosis not present

## 2017-08-22 DIAGNOSIS — M545 Low back pain: Secondary | ICD-10-CM | POA: Diagnosis not present

## 2017-08-22 DIAGNOSIS — I2510 Atherosclerotic heart disease of native coronary artery without angina pectoris: Secondary | ICD-10-CM | POA: Diagnosis not present

## 2017-08-22 DIAGNOSIS — G8929 Other chronic pain: Secondary | ICD-10-CM | POA: Diagnosis not present

## 2017-08-22 DIAGNOSIS — G20 Parkinson's disease: Secondary | ICD-10-CM | POA: Diagnosis not present

## 2017-08-23 ENCOUNTER — Other Ambulatory Visit (HOSPITAL_COMMUNITY): Payer: Self-pay | Admitting: Neurology

## 2017-08-23 DIAGNOSIS — I6381 Other cerebral infarction due to occlusion or stenosis of small artery: Secondary | ICD-10-CM

## 2017-08-24 DIAGNOSIS — I5032 Chronic diastolic (congestive) heart failure: Secondary | ICD-10-CM | POA: Diagnosis not present

## 2017-08-24 DIAGNOSIS — I110 Hypertensive heart disease with heart failure: Secondary | ICD-10-CM | POA: Diagnosis not present

## 2017-08-24 DIAGNOSIS — I2510 Atherosclerotic heart disease of native coronary artery without angina pectoris: Secondary | ICD-10-CM | POA: Diagnosis not present

## 2017-08-24 DIAGNOSIS — M545 Low back pain: Secondary | ICD-10-CM | POA: Diagnosis not present

## 2017-08-24 DIAGNOSIS — G20 Parkinson's disease: Secondary | ICD-10-CM | POA: Diagnosis not present

## 2017-08-24 DIAGNOSIS — G8929 Other chronic pain: Secondary | ICD-10-CM | POA: Diagnosis not present

## 2017-08-29 DIAGNOSIS — G8929 Other chronic pain: Secondary | ICD-10-CM | POA: Diagnosis not present

## 2017-08-29 DIAGNOSIS — I2510 Atherosclerotic heart disease of native coronary artery without angina pectoris: Secondary | ICD-10-CM | POA: Diagnosis not present

## 2017-08-29 DIAGNOSIS — I5032 Chronic diastolic (congestive) heart failure: Secondary | ICD-10-CM | POA: Diagnosis not present

## 2017-08-29 DIAGNOSIS — I110 Hypertensive heart disease with heart failure: Secondary | ICD-10-CM | POA: Diagnosis not present

## 2017-08-29 DIAGNOSIS — G20 Parkinson's disease: Secondary | ICD-10-CM | POA: Diagnosis not present

## 2017-08-29 DIAGNOSIS — M545 Low back pain: Secondary | ICD-10-CM | POA: Diagnosis not present

## 2017-08-30 ENCOUNTER — Ambulatory Visit (INDEPENDENT_AMBULATORY_CARE_PROVIDER_SITE_OTHER): Payer: Medicare Other

## 2017-08-30 ENCOUNTER — Ambulatory Visit (HOSPITAL_COMMUNITY)
Admission: RE | Admit: 2017-08-30 | Discharge: 2017-08-30 | Disposition: A | Discharge status: Home / Self Care | Payer: Medicare Other | Source: Ambulatory Visit | Attending: Neurology | Admitting: Neurology

## 2017-08-30 VITALS — BP 118/80 | HR 68 | Temp 97.6°F | Ht 76.5 in | Wt 349.2 lb

## 2017-08-30 DIAGNOSIS — I679 Cerebrovascular disease, unspecified: Secondary | ICD-10-CM | POA: Insufficient documentation

## 2017-08-30 DIAGNOSIS — Z0000 Encounter for general adult medical examination without abnormal findings: Secondary | ICD-10-CM | POA: Diagnosis not present

## 2017-08-30 DIAGNOSIS — F99 Mental disorder, not otherwise specified: Secondary | ICD-10-CM | POA: Insufficient documentation

## 2017-08-30 DIAGNOSIS — I6381 Other cerebral infarction due to occlusion or stenosis of small artery: Secondary | ICD-10-CM | POA: Diagnosis not present

## 2017-08-30 DIAGNOSIS — R251 Tremor, unspecified: Secondary | ICD-10-CM | POA: Diagnosis not present
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
## 2017-08-30 NOTE — Progress Notes (Signed)
PCP notes:   Health maintenance:  No gaps identified.  Abnormal screenings:   Hearing - failed  Hearing Screening   125Hz  250Hz  500Hz  1000Hz  2000Hz  3000Hz  4000Hz  6000Hz  8000Hz   Right ear:   40 40 40  0    Left ear:   40 40 0  0     Patient concerns:   Patient would like PCP to continue approving home health OT services.  Per spouse, pt has now received a ramp, wheelchair, shower chair and has been fitted for a scooter and lift for vehicle.   Nurse concerns:  None  Next PCP appt:   09/06/17 @ 1430

## 2017-08-30 NOTE — Patient Instructions (Signed)
Mr. Blanchette , Thank you for taking time to come for your Medicare Wellness Visit. I appreciate your ongoing commitment to your health goals. Please review the following plan we discussed and let me know if I can assist you in the future.   These are the goals we discussed: Goals    . Increase physical activity     Starting 08/30/2017, I will continue to participate in occupational therapy as ordered.        This is a list of the screening recommended for you and due dates:  Health Maintenance  Topic Date Due  .  Hepatitis C: One time screening is recommended by Center for Disease Control  (CDC) for  adults born from [birth year] through 1965.   07/11/2026*  . Tetanus Vaccine  10/02/2019  . Colon Cancer Screening  07/11/2022  . Flu Shot  Completed  . Pneumonia vaccines  Completed  *Topic was postponed. The date shown is not the original due date.   Preventive Care for Adults  A healthy lifestyle and preventive care can promote health and wellness. Preventive health guidelines for adults include the following key practices.  . A routine yearly physical is a good way to check with your health care provider about your health and preventive screening. It is a chance to share any concerns and updates on your health and to receive a thorough exam.  . Visit your dentist for a routine exam and preventive care every 6 months. Brush your teeth twice a day and floss once a day. Good oral hygiene prevents tooth decay and gum disease.  . The frequency of eye exams is based on your age, health, family medical history, use  of contact lenses, and other factors. Follow your health care provider's recommendations for frequency of eye exams.  . Eat a healthy diet. Foods like vegetables, fruits, whole grains, low-fat dairy products, and lean protein foods contain the nutrients you need without too many calories. Decrease your intake of foods high in solid fats, added sugars, and salt. Eat the right amount of  calories for you. Get information about a proper diet from your health care provider, if necessary.  . Regular physical exercise is one of the most important things you can do for your health. Most adults should get at least 150 minutes of moderate-intensity exercise (any activity that increases your heart rate and causes you to sweat) each week. In addition, most adults need muscle-strengthening exercises on 2 or more days a week.  Silver Sneakers may be a benefit available to you. To determine eligibility, you may visit the website: www.silversneakers.com or contact program at 914-451-5269 Mon-Fri between 8AM-8PM.   . Maintain a healthy weight. The body mass index (BMI) is a screening tool to identify possible weight problems. It provides an estimate of body fat based on height and weight. Your health care provider can find your BMI and can help you achieve or maintain a healthy weight.   For adults 20 years and older: ? A BMI below 18.5 is considered underweight. ? A BMI of 18.5 to 24.9 is normal. ? A BMI of 25 to 29.9 is considered overweight. ? A BMI of 30 and above is considered obese.   . Maintain normal blood lipids and cholesterol levels by exercising and minimizing your intake of saturated fat. Eat a balanced diet with plenty of fruit and vegetables. Blood tests for lipids and cholesterol should begin at age 66 and be repeated every 5 years. If your lipid  or cholesterol levels are high, you are over 50, or you are at high risk for heart disease, you may need your cholesterol levels checked more frequently. Ongoing high lipid and cholesterol levels should be treated with medicines if diet and exercise are not working.  . If you smoke, find out from your health care provider how to quit. If you do not use tobacco, please do not start.  . If you choose to drink alcohol, please do not consume more than 2 drinks per day. One drink is considered to be 12 ounces (355 mL) of beer, 5 ounces  (148 mL) of wine, or 1.5 ounces (44 mL) of liquor.  . If you are 63-50 years old, ask your health care provider if you should take aspirin to prevent strokes.  . Use sunscreen. Apply sunscreen liberally and repeatedly throughout the day. You should seek shade when your shadow is shorter than you. Protect yourself by wearing long sleeves, pants, a wide-brimmed hat, and sunglasses year round, whenever you are outdoors.  . Once a month, do a whole body skin exam, using a mirror to look at the skin on your back. Tell your health care provider of new moles, moles that have irregular borders, moles that are larger than a pencil eraser, or moles that have changed in shape or color.

## 2017-09-01 DIAGNOSIS — G8929 Other chronic pain: Secondary | ICD-10-CM | POA: Diagnosis not present

## 2017-09-01 DIAGNOSIS — I110 Hypertensive heart disease with heart failure: Secondary | ICD-10-CM | POA: Diagnosis not present

## 2017-09-01 DIAGNOSIS — I5032 Chronic diastolic (congestive) heart failure: Secondary | ICD-10-CM | POA: Diagnosis not present

## 2017-09-01 DIAGNOSIS — I2510 Atherosclerotic heart disease of native coronary artery without angina pectoris: Secondary | ICD-10-CM | POA: Diagnosis not present

## 2017-09-01 DIAGNOSIS — G20 Parkinson's disease: Secondary | ICD-10-CM | POA: Diagnosis not present

## 2017-09-01 DIAGNOSIS — M545 Low back pain: Secondary | ICD-10-CM | POA: Diagnosis not present

## 2017-09-02 NOTE — Progress Notes (Signed)
I reviewed health advisor's note, was available for consultation, and agree with documentation and plan.  

## 2017-09-06 ENCOUNTER — Ambulatory Visit: Payer: No Typology Code available for payment source | Admitting: Family Medicine

## 2017-09-08 ENCOUNTER — Ambulatory Visit (INDEPENDENT_AMBULATORY_CARE_PROVIDER_SITE_OTHER): Payer: Medicare Other | Admitting: Family Medicine

## 2017-09-08 ENCOUNTER — Encounter: Payer: Self-pay | Admitting: Family Medicine

## 2017-09-08 VITALS — BP 138/80 | HR 124 | Temp 97.7°F | Wt 341.5 lb

## 2017-09-08 DIAGNOSIS — G20 Parkinson's disease: Secondary | ICD-10-CM | POA: Diagnosis not present

## 2017-09-08 DIAGNOSIS — R634 Abnormal weight loss: Secondary | ICD-10-CM

## 2017-09-08 DIAGNOSIS — R630 Anorexia: Secondary | ICD-10-CM

## 2017-09-08 DIAGNOSIS — R531 Weakness: Secondary | ICD-10-CM

## 2017-09-08 DIAGNOSIS — I6381 Other cerebral infarction due to occlusion or stenosis of small artery: Secondary | ICD-10-CM | POA: Diagnosis not present
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
## 2017-09-08 NOTE — Patient Instructions (Addendum)
Good to see you today!  Try to add a protein smoothie mid day and make sure to get good protein sources for breakfast and lunch  Follow up in 3 months, sooner if you need [name]!  I will get in touch with OT/PT about extending your orders   Protein Content in Foods Generally, most healthy people need around 50 grams of protein each day. Depending on your overall health, you may need more or less protein in your diet. Talk to your health care provider or dietitian about how much protein you need. See the following list for the protein content of some common foods. High-protein foods High-protein foods contain 4 grams (4 g) or more of protein per serving. They include:  Beef, ground sirloin (cooked) - 3 oz have 24 g of protein.  Cheese (hard) - 1 oz has 7 g of protein.  Chicken breast, boneless and skinless (cooked) - 3 oz have 13.4 g of protein.  Cottage cheese - 1/2 cup has 13.4 g of protein.  Egg - 1 egg has 6 g of protein.  Fish, filet (cooked) - 1 oz has 6-7 g of protein.  Garbanzo beans (canned or cooked) - 1/2 cup has 6-7 g of protein.  Kidney beans (canned or cooked) - 1/2 cup has 6-7 g of protein.  Lamb (cooked) - 3 oz has 24 g of protein.  Milk - 1 cup (8 oz) has 8 g of protein.  Nuts (peanuts, pistachios, almonds) - 1 oz has 6 g of protein.  Peanut butter - 1 oz has 7-8 g of protein.  Pork tenderloin (cooked) - 3 oz has 18.4 g of protein.  Pumpkin seeds - 1 oz has 8.5 g of protein.  Soybeans (roasted) - 1 oz has 8 g of protein.  Soybeans (cooked) - 1/2 cup has 11 g of protein.  Soy milk - 1 cup (8 oz) has 5-10 g of protein.  Soy or vegetable patty - 1 patty has 11 g of protein.  Sunflower seeds - 1 oz has 5.5 g of protein.  Tofu (firm) - 1/2 cup has 20 g of protein.  Tuna (canned in water) - 3 oz has 20 g of protein.  Yogurt - 6 oz has 8 g of protein.  Low-protein foods Low-protein foods contain 3 grams (3 g) or less of protein per serving. They  include:  Beets (raw or cooked) - 1/2 cup has 1.5 g of protein.  Bran cereal - 1/2 cup has 2-3 g of protein.  Bread - 1 slice has 2.5 g of protein.  Broccoli (raw or cooked) - 1/2 cup has 2 g of protein.  Collard greens (raw or cooked) - 1/2 cup has 2 g of protein.  Corn (fresh or cooked) - 1/2 cup has 2 g of protein.  Cream cheese - 1 oz has 2 g of protein.  Creamer (half-and-half) - 1 oz has 1 g of protein.  Flour tortilla - 1 tortilla has 2.5 g of protein  Frozen yogurt - 1/2 cup has 3 g of protein.  Fruit or vegetable juice - 1/2 cup has 1 g of protein.  Green beans (raw or cooked) - 1/2 cup has 1 g of protein.  Green peas (canned) - 1/2 cup has 3.5 g of protein.  Muffins - 1 small muffin (2 oz) has 3 g of protein.  Oatmeal (cooked) - 1/2 cup has 3 g of protein.  Potato (baked with skin) - 1 medium potato has 3 g  of protein.  Rice (cooked) - 1/2 cup has 2.5-3.5 g of protein.  Sour cream - 1/2 cup has 2.5 g of protein.  Spinach (cooked) - 1/2 cup has 3 g of protein.  Squash (cooked) - 1/2 cup has 1.5 g of protein.  Actual amounts of protein may be different depending on processing. Talk with your health care provider or dietitian about what foods are recommended for you. This information is not intended to replace advice given to you by your health care provider. Make sure you discuss any questions you have with your health care provider. Document Released: 09/26/2015 Document Revised: 03/07/2016 Document Reviewed: 03/07/2016 Elsevier Interactive Patient Education  [employer].

## 2017-09-12 LAB — CUP PACEART REMOTE DEVICE CHECK
Date Time Interrogation Session: 20190210033952
Implantable Pulse Generator Implant Date: 20170719

## 2017-09-14 ENCOUNTER — Other Ambulatory Visit: Payer: Self-pay | Admitting: Internal Medicine

## 2017-09-16 ENCOUNTER — Encounter: Payer: Self-pay | Admitting: Family Medicine

## 2017-09-18 DIAGNOSIS — S61401A Unspecified open wound of right hand, initial encounter: Secondary | ICD-10-CM | POA: Diagnosis not present

## 2017-09-18 DIAGNOSIS — W540XXA Bitten by dog, initial encounter: Secondary | ICD-10-CM | POA: Diagnosis not present

## 2017-09-21 ENCOUNTER — Ambulatory Visit (INDEPENDENT_AMBULATORY_CARE_PROVIDER_SITE_OTHER): Payer: Medicare Other | Admitting: *Deleted

## 2017-09-21 DIAGNOSIS — I639 Cerebral infarction, unspecified: Secondary | ICD-10-CM

## 2017-09-22 NOTE — Progress Notes (Signed)
Carelink Summary Report / Loop Recorder 

## 2017-09-26 ENCOUNTER — Ambulatory Visit: Payer: Medicare Other | Admitting: Family Medicine

## 2017-09-28 ENCOUNTER — Ambulatory Visit (INDEPENDENT_AMBULATORY_CARE_PROVIDER_SITE_OTHER): Payer: Medicare Other | Admitting: Internal Medicine

## 2017-09-28 ENCOUNTER — Encounter: Payer: Self-pay | Admitting: Internal Medicine

## 2017-09-28 VITALS — BP 124/82 | HR 83 | Temp 97.3°F | Ht 77.0 in | Wt 341.2 lb

## 2017-09-28 DIAGNOSIS — I6381 Other cerebral infarction due to occlusion or stenosis of small artery: Secondary | ICD-10-CM | POA: Diagnosis not present

## 2017-09-28 DIAGNOSIS — M109 Gout, unspecified: Secondary | ICD-10-CM | POA: Diagnosis not present

## 2017-09-28 MED ORDER — PREDNISONE 10 MG PO TABS: 40.0000 mg | ORAL_TABLET | Freq: Every day | ORAL | 0 refills | Status: DC

## 2017-09-28 MED ORDER — ALLOPURINOL 300 MG PO TABS: 300.0000 mg | ORAL_TABLET | Freq: Every day | ORAL | 11 refills | Status: DC

## 2017-09-28 MED ORDER — AMOXICILLIN-POT CLAVULANATE 875-125 MG PO TABS: 1.0000 | ORAL_TABLET | Freq: Two times a day (BID) | ORAL | 0 refills | Status: DC
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
## 2017-09-28 NOTE — Assessment & Plan Note (Signed)
Acute inflammation in left ankle Discussed why I don't think it is cellulitis--but will extend the augmentin, just in case Will increase his allopurinol to 300mg  daily---with goal of uric acid under 6 in future Prednisone with wean Continue the colchicine

## 2017-09-29 ENCOUNTER — Other Ambulatory Visit (INDEPENDENT_AMBULATORY_CARE_PROVIDER_SITE_OTHER): Payer: Medicare Other

## 2017-09-29 DIAGNOSIS — E2740 Unspecified adrenocortical insufficiency: Secondary | ICD-10-CM | POA: Diagnosis not present

## 2017-09-29 DIAGNOSIS — E230 Hypopituitarism: Secondary | ICD-10-CM

## 2017-09-29 LAB — BASIC METABOLIC PANEL
BUN: 15 mg/dL (ref 6–23)
CO2: 25 mEq/L (ref 19–32)
Calcium: 10.1 mg/dL (ref 8.4–10.5)
Chloride: 106 mEq/L (ref 96–112)
Creatinine, Ser: 1.04 mg/dL (ref 0.40–1.50)
GFR: 74.62 mL/min (ref >60.00)
Glucose, Bld: 125 mg/dL — ABNORMAL HIGH (ref 70–99)
Potassium: 4 mEq/L (ref 3.5–5.1)
Sodium: 138 mEq/L (ref 135–145)

## 2017-09-29 LAB — TSH: TSH: 0.04 u[IU]/mL — ABNORMAL LOW (ref 0.35–4.50)

## 2017-09-29 LAB — T4, FREE: Free T4: 1.1 ng/dL (ref 0.60–1.60)

## 2017-09-29 LAB — TESTOSTERONE: Testosterone: 141 ng/dL — ABNORMAL LOW (ref 300.00–890.00)

## 2017-09-30 LAB — PROLACTIN: Prolactin: 25.1 ng/mL — ABNORMAL HIGH (ref 4.0–15.2)

## 2017-10-02 ENCOUNTER — Encounter: Payer: Self-pay | Admitting: Endocrinology

## 2017-10-02 ENCOUNTER — Ambulatory Visit (INDEPENDENT_AMBULATORY_CARE_PROVIDER_SITE_OTHER): Payer: Medicare Other | Admitting: Endocrinology

## 2017-10-02 VITALS — BP 116/82 | HR 98 | Ht 77.0 in | Wt 337.0 lb

## 2017-10-02 DIAGNOSIS — E039 Hypothyroidism, unspecified: Secondary | ICD-10-CM

## 2017-10-02 DIAGNOSIS — I951 Orthostatic hypotension: Secondary | ICD-10-CM

## 2017-10-02 DIAGNOSIS — G903 Multi-system degeneration of the autonomic nervous system: Secondary | ICD-10-CM

## 2017-10-02 DIAGNOSIS — E230 Hypopituitarism: Secondary | ICD-10-CM

## 2017-10-02 DIAGNOSIS — I6381 Other cerebral infarction due to occlusion or stenosis of small artery: Secondary | ICD-10-CM | POA: Diagnosis not present

## 2017-10-02 MED ORDER — TESTOSTERONE CYPIONATE 200 MG/ML IM SOLN
150.0000 mg | Freq: Once | INTRAMUSCULAR | Status: AC
  Administered 2017-10-02: 150 mg via INTRAMUSCULAR
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
## 2017-10-02 NOTE — Patient Instructions (Signed)
Leave off thyoid pills 2/7 days

## 2017-10-04 ENCOUNTER — Other Ambulatory Visit: Payer: Self-pay | Admitting: Family Medicine

## 2017-10-04 MED ORDER — TOPIRAMATE 50 MG PO TABS: 50.0000 mg | ORAL_TABLET | Freq: Two times a day (BID) | ORAL | 3 refills | Status: DC

## 2017-10-23 ENCOUNTER — Telehealth: Payer: Self-pay | Admitting: Family Medicine

## 2017-10-23 NOTE — Telephone Encounter (Signed)
Copied from [place]. Topic: Quick Communication - See Telephone Encounter >> Oct 23, 2017 12:39 PM Antonieta Iba C wrote: CRM for notification. See Telephone encounter for: 10/23/17.  Pt called in to request a letter from PCP excusing his son (care taker) from jury duty. He said that son has jury duty on May 2nd. They would like letter as soon as possible.

## 2017-10-24 ENCOUNTER — Ambulatory Visit (INDEPENDENT_AMBULATORY_CARE_PROVIDER_SITE_OTHER): Payer: Medicare Other | Admitting: *Deleted

## 2017-10-24 DIAGNOSIS — I639 Cerebral infarction, unspecified: Secondary | ICD-10-CM | POA: Diagnosis not present

## 2017-10-24 NOTE — Telephone Encounter (Signed)
Will you please call patient and get name of his son so I can write letter?

## 2017-10-25 ENCOUNTER — Encounter: Payer: Self-pay | Admitting: Family Medicine

## 2017-10-25 NOTE — Telephone Encounter (Signed)
Called and spoke to patient informing him letter is printed and placed for pickup. Understanding verbalized nothing further needed at this time.

## 2017-10-25 NOTE — Progress Notes (Signed)
Carelink Summary Report / Loop Recorder 

## 2017-10-25 NOTE — Telephone Encounter (Signed)
Please call patient and let him know that letter is ready.

## 2017-10-25 NOTE — Telephone Encounter (Signed)
Called and spoke with patient he states that son is a Conservation officer, nature.

## 2017-10-25 NOTE — Telephone Encounter (Signed)
Patient called stating that he received a call this morning and wanted to include that he would like to pick this letter up from the office. He states that he needs to pick it up and mail it off by Friday (10/27/17). Patient will be going out of town Friday (10/27/17) until Tuesday (10/31/17). Would like a call when this letter is ready for pick up.

## 2017-10-30 LAB — CUP PACEART REMOTE DEVICE CHECK
Date Time Interrogation Session: 20190315094129
Implantable Pulse Generator Implant Date: 20170719

## 2017-11-13 DIAGNOSIS — M79641 Pain in right hand: Secondary | ICD-10-CM | POA: Insufficient documentation

## 2017-11-13 DIAGNOSIS — M79642 Pain in left hand: Secondary | ICD-10-CM | POA: Diagnosis not present

## 2017-11-13 DIAGNOSIS — M10442 Other secondary gout, left hand: Secondary | ICD-10-CM | POA: Diagnosis not present

## 2017-11-14 ENCOUNTER — Ambulatory Visit: Payer: Self-pay

## 2017-11-14 NOTE — Telephone Encounter (Signed)
Pt has 30' appt on 11/17/17 at 10:30 with Glenda Chroman FNP.

## 2017-11-14 NOTE — Telephone Encounter (Signed)
Pt. Called to report his orthopedic doctor put him on Sulfameth and he believes this causing him urinary frequency. He states he has had some "urinary changes and I may need to see a kidney doctor." "I have a long history of illness and need to talk to Ms. Carlean Purl." Appointment made for this week as requested.  Reason for Disposition . Urinating more frequently than usual (i.e., frequency)  Answer Assessment - Initial Assessment Questions 1. SYMPTOM: "What's the main symptom you're concerned about?" (e.g., frequency, incontinence)     Frequency 2. ONSET: "When did the  ________  start?"      Last night 3. PAIN: "Is there any pain?" If so, ask: "How bad is it?" (Scale: 1-10; mild, moderate, severe)     No 4. CAUSE: "What do you think is causing the symptoms?"     Maybe medication 5. OTHER SYMPTOMS: "Do you have any other symptoms?" (e.g., fever, flank pain, blood in urine, pain with urination)     "Urine is not clear"   Strong smell 6. PREGNANCY: "Is there any chance you are pregnant?" "When was your last menstrual period?"     No  Protocols used: URINARY [facility]

## 2017-11-15 DIAGNOSIS — M79641 Pain in right hand: Secondary | ICD-10-CM | POA: Diagnosis not present

## 2017-11-15 DIAGNOSIS — M10442 Other secondary gout, left hand: Secondary | ICD-10-CM | POA: Diagnosis not present

## 2017-11-17 ENCOUNTER — Encounter: Payer: Self-pay | Admitting: Family Medicine

## 2017-11-17 ENCOUNTER — Ambulatory Visit (INDEPENDENT_AMBULATORY_CARE_PROVIDER_SITE_OTHER): Payer: Medicare Other | Admitting: Family Medicine

## 2017-11-17 VITALS — BP 126/90 | HR 90 | Ht 77.0 in | Wt 324.5 lb

## 2017-11-17 DIAGNOSIS — R350 Frequency of micturition: Secondary | ICD-10-CM | POA: Diagnosis not present

## 2017-11-17 DIAGNOSIS — R8290 Unspecified abnormal findings in urine: Secondary | ICD-10-CM | POA: Diagnosis not present

## 2017-11-17 DIAGNOSIS — I6381 Other cerebral infarction due to occlusion or stenosis of small artery: Secondary | ICD-10-CM | POA: Diagnosis not present

## 2017-11-17 DIAGNOSIS — R634 Abnormal weight loss: Secondary | ICD-10-CM | POA: Diagnosis not present

## 2017-11-17 DIAGNOSIS — M109 Gout, unspecified: Secondary | ICD-10-CM

## 2017-11-17 DIAGNOSIS — R739 Hyperglycemia, unspecified: Secondary | ICD-10-CM | POA: Diagnosis not present

## 2017-11-17 LAB — POC URINALSYSI DIPSTICK (AUTOMATED)
Bilirubin, UA: NEGATIVE
Blood, UA: NEGATIVE
Glucose, UA: NEGATIVE
Ketones, UA: NEGATIVE
Nitrite, UA: NEGATIVE
Protein, UA: NEGATIVE
Spec Grav, UA: >=1.03 — AB (ref 1.010–1.025)
Urobilinogen, UA: 0.2 E.U./dL
pH, UA: 6 (ref 5.0–8.0)

## 2017-11-17 LAB — URIC ACID: Uric Acid, Serum: 4.6 mg/dL (ref 4.0–7.8)

## 2017-11-17 LAB — HEMOGLOBIN A1C: Hgb A1c MFr Bld: 5.2 % (ref 4.6–6.5)
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
## 2017-11-17 NOTE — Patient Instructions (Addendum)
Good to see you today  Liberalize you fat and calories, eat protein at every meal and snack  I am sending your urine for a culture and will notify you of results next week as well as other labs I'm checking

## 2017-11-18 LAB — URINE CULTURE
MICRO NUMBER:: 90573349
SPECIMEN QUALITY:: ADEQUATE

## 2017-11-19 ENCOUNTER — Encounter: Payer: Self-pay | Admitting: Family Medicine

## 2017-11-27 ENCOUNTER — Ambulatory Visit (INDEPENDENT_AMBULATORY_CARE_PROVIDER_SITE_OTHER): Payer: Medicare Other | Admitting: *Deleted

## 2017-11-27 DIAGNOSIS — I639 Cerebral infarction, unspecified: Secondary | ICD-10-CM | POA: Diagnosis not present

## 2017-11-27 LAB — CUP PACEART REMOTE DEVICE CHECK
Date Time Interrogation Session: 20190417141218
Implantable Pulse Generator Implant Date: 20170719

## 2017-11-28 ENCOUNTER — Encounter: Payer: Self-pay | Admitting: Endocrinology

## 2017-11-28 ENCOUNTER — Ambulatory Visit (INDEPENDENT_AMBULATORY_CARE_PROVIDER_SITE_OTHER): Payer: Medicare Other | Admitting: Endocrinology

## 2017-11-28 ENCOUNTER — Other Ambulatory Visit (INDEPENDENT_AMBULATORY_CARE_PROVIDER_SITE_OTHER): Payer: Medicare Other

## 2017-11-28 ENCOUNTER — Other Ambulatory Visit: Payer: Medicare Other

## 2017-11-28 VITALS — BP 124/74 | HR 115 | Ht 77.0 in | Wt 323.8 lb

## 2017-11-28 DIAGNOSIS — E039 Hypothyroidism, unspecified: Secondary | ICD-10-CM | POA: Diagnosis not present

## 2017-11-28 DIAGNOSIS — Z8673 Personal history of transient ischemic attack (TIA), and cerebral infarction without residual deficits: Secondary | ICD-10-CM

## 2017-11-28 DIAGNOSIS — E230 Hypopituitarism: Secondary | ICD-10-CM

## 2017-11-28 LAB — TESTOSTERONE: Testosterone: 430.23 ng/dL (ref 300.00–890.00)

## 2017-11-28 LAB — CBC
HCT: 49.4 % (ref 39.0–52.0)
Hemoglobin: 16.4 g/dL (ref 13.0–17.0)
MCHC: 33.2 g/dL (ref 30.0–36.0)
MCV: 84.2 fl (ref 78.0–100.0)
Platelets: 208 10*3/uL (ref 150.0–400.0)
RBC: 5.87 Mil/uL — ABNORMAL HIGH (ref 4.22–5.81)
RDW: 15.3 % (ref 11.5–15.5)
WBC: 6.4 10*3/uL (ref 4.0–10.5)

## 2017-11-28 LAB — T3, FREE: T3, Free: 3.6 pg/mL (ref 2.3–4.2)

## 2017-11-28 LAB — TSH: TSH: 0.1 u[IU]/mL — ABNORMAL LOW (ref 0.35–4.50)

## 2017-11-28 LAB — T4, FREE: Free T4: 1.03 ng/dL (ref 0.60–1.60)

## 2017-11-28 MED ORDER — TESTOSTERONE CYPIONATE 200 MG/ML IM SOLN: 150.0000 mg | INTRAMUSCULAR | 5 refills | Status: DC
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
## 2017-11-28 NOTE — Progress Notes (Signed)
Carelink Summary Report / Loop Recorder 

## 2017-11-29 NOTE — Progress Notes (Signed)
Please call to let patient know that the lab results are all within the desired range and no further action needed, testosterone prescription has been sent

## 2017-12-01 ENCOUNTER — Ambulatory Visit: Payer: Medicare Other | Admitting: Endocrinology

## 2017-12-11 ENCOUNTER — Ambulatory Visit (INDEPENDENT_AMBULATORY_CARE_PROVIDER_SITE_OTHER): Payer: Medicare Other | Admitting: Family Medicine

## 2017-12-11 ENCOUNTER — Encounter: Payer: Self-pay | Admitting: Family Medicine

## 2017-12-11 VITALS — BP 102/68 | HR 102 | Temp 97.4°F | Ht 77.0 in | Wt 326.8 lb

## 2017-12-11 DIAGNOSIS — I5032 Chronic diastolic (congestive) heart failure: Secondary | ICD-10-CM

## 2017-12-11 DIAGNOSIS — R42 Dizziness and giddiness: Secondary | ICD-10-CM

## 2017-12-11 DIAGNOSIS — G20 Parkinson's disease: Secondary | ICD-10-CM

## 2017-12-11 DIAGNOSIS — I6381 Other cerebral infarction due to occlusion or stenosis of small artery: Secondary | ICD-10-CM

## 2017-12-11 NOTE — Patient Instructions (Addendum)
Go ahead and schedule follow up appointments with cardiology and neurology  Follow up in 6 months
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
## 2017-12-18 LAB — CUP PACEART REMOTE DEVICE CHECK
Date Time Interrogation Session: 20190520144105
Implantable Pulse Generator Implant Date: 20170719

## 2017-12-20 ENCOUNTER — Ambulatory Visit (INDEPENDENT_AMBULATORY_CARE_PROVIDER_SITE_OTHER): Payer: Medicare Other | Admitting: Internal Medicine

## 2017-12-20 ENCOUNTER — Encounter: Payer: Self-pay | Admitting: Internal Medicine

## 2017-12-20 ENCOUNTER — Telehealth: Payer: Self-pay | Admitting: *Deleted

## 2017-12-20 VITALS — BP 108/78 | HR 96 | Temp 97.3°F | Ht 77.0 in | Wt 327.0 lb

## 2017-12-20 DIAGNOSIS — R350 Frequency of micturition: Secondary | ICD-10-CM | POA: Insufficient documentation

## 2017-12-20 DIAGNOSIS — I6381 Other cerebral infarction due to occlusion or stenosis of small artery: Secondary | ICD-10-CM

## 2017-12-20 LAB — POC URINALSYSI DIPSTICK (AUTOMATED)
Bilirubin, UA: NEGATIVE
Blood, UA: NEGATIVE
Glucose, UA: NEGATIVE
Ketones, UA: NEGATIVE
Nitrite, UA: NEGATIVE
Protein, UA: NEGATIVE
Spec Grav, UA: 1.02 (ref 1.010–1.025)
Urobilinogen, UA: 0.2 E.U./dL
pH, UA: 6 (ref 5.0–8.0)

## 2017-12-20 MED ORDER — SULFAMETHOXAZOLE-TRIMETHOPRIM 800-160 MG PO TABS: 1.0000 | ORAL_TABLET | Freq: Two times a day (BID) | ORAL | 0 refills | Status: DC

## 2017-12-20 NOTE — Telephone Encounter (Signed)
Spoke with patient to discuss med list.  While processing recent summary report, noted that Eliquis was no longer listed as an active med.  Per records, Eliquis was d/c on 12/11/17 by Clarene Reamer, FNP due to "change in therapy".  After discussing with patient, he reports he has been on Eliquis for many years and that it was d/c due to low BP.  Patient then states that his "platelets were fine, so that's why they stopped it."  Per our records, apixaban was started on 06/30/17 for new PAF noted on LINQ.  At appointments on 08/01/17 and 08/14/17, med list indicates that patient is taking apixaban.  Patient is insistent that we are discussing the same medication despite me explaining that this med would not be stopped for either of those reasons.  He states he still has some at home and can restart it.  Advised that I will discuss with [name] and call him back with recommendations.
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
## 2017-12-20 NOTE — Addendum Note (Signed)
Addended by: Pilar Grammes on: 12/20/2017 11:36 AM   Modules accepted: Orders

## 2017-12-20 NOTE — Assessment & Plan Note (Signed)
Similar to last month 2+ leuks in urine--but I suspect no true UTI No prostate so may have urethral lesion  Will treat empirically again Needs to go back to urologist

## 2017-12-21 DIAGNOSIS — R4189 Other symptoms and signs involving cognitive functions and awareness: Secondary | ICD-10-CM | POA: Diagnosis not present

## 2017-12-21 DIAGNOSIS — Z8673 Personal history of transient ischemic attack (TIA), and cerebral infarction without residual deficits: Secondary | ICD-10-CM | POA: Diagnosis not present

## 2017-12-21 DIAGNOSIS — F458 Other somatoform disorders: Secondary | ICD-10-CM | POA: Diagnosis not present

## 2017-12-21 DIAGNOSIS — I480 Paroxysmal atrial fibrillation: Secondary | ICD-10-CM | POA: Diagnosis not present

## 2017-12-21 DIAGNOSIS — F329 Major depressive disorder, single episode, unspecified: Secondary | ICD-10-CM | POA: Diagnosis not present

## 2017-12-21 DIAGNOSIS — G20 Parkinson's disease: Secondary | ICD-10-CM | POA: Diagnosis not present

## 2017-12-21 DIAGNOSIS — G4733 Obstructive sleep apnea (adult) (pediatric): Secondary | ICD-10-CM | POA: Diagnosis not present

## 2017-12-22 DIAGNOSIS — F458 Other somatoform disorders: Secondary | ICD-10-CM | POA: Insufficient documentation

## 2017-12-22 DIAGNOSIS — R251 Tremor, unspecified: Secondary | ICD-10-CM | POA: Insufficient documentation

## 2017-12-23 LAB — URINE CULTURE
MICRO NUMBER:: 90705294
SPECIMEN QUALITY:: ADEQUATE
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
## 2017-12-26 NOTE — Telephone Encounter (Signed)
-----   Message from [name], FNP sent at 12/22/2017 12:48 PM EDT ----- Regarding: RE: apixaban fell off Pt med list Hi Sonia Baller,  Mr. Dennis [name] had told me at his last visit that he had been off the apaxiban for some time. I called the pharmacy and they said he last filled it 07/27/17.  I'm not sure why he stopped it, I was under the impression he was told he no longer needed it.  Thanks,  Tor Netters ----- Message ----- From: Damian Leavell, RN Sent: 12/21/2017   7:28 AM To: [name], FNP Subject: apixaban fell off Pt med list                  Hello! [name] started this Pt on apixaban for afib last December. We were checking his chart when we saw that you discontinued this medication at his last visit on 12/11/2017 but there is no note as to why.  Was this just an error?  If so we can return to Pt list and make sure he knows to continue it.  If it was discontinued for a reason do you mind letting us know why?  Thank you for your assistance! Sonia Baller RN for [name]

## 2017-12-26 NOTE — Telephone Encounter (Signed)
Call placed to Pt.  Notified Pt that Clarene Reamer had taken Eliquis off Pt chart because he had not filled it, not that he didn't need to continue it. Per Pt one of his many doctors told him to stop Eliquis. Per review of his neurology appointment he was to continue Eliquis.  Advised Pt to restart Eliquis at this time and will make first available appointment with afib clinic.  Eliquis 5 mg bid.

## 2017-12-26 NOTE — Telephone Encounter (Signed)
Appt made with afib clinic for Thursday 12/28/2017 at 11:30 am. Pt indicates understanding.

## 2017-12-27 ENCOUNTER — Encounter: Payer: Self-pay | Admitting: Family Medicine

## 2017-12-27 ENCOUNTER — Ambulatory Visit (INDEPENDENT_AMBULATORY_CARE_PROVIDER_SITE_OTHER): Payer: Medicare Other | Admitting: Family Medicine

## 2017-12-27 ENCOUNTER — Ambulatory Visit: Payer: Medicare Other | Admitting: Family Medicine

## 2017-12-27 VITALS — BP 116/82 | HR 83 | Temp 97.8°F | Ht 77.0 in | Wt 328.2 lb

## 2017-12-27 DIAGNOSIS — I6381 Other cerebral infarction due to occlusion or stenosis of small artery: Secondary | ICD-10-CM | POA: Diagnosis not present

## 2017-12-27 DIAGNOSIS — L02415 Cutaneous abscess of right lower limb: Secondary | ICD-10-CM

## 2017-12-27 MED ORDER — DOXYCYCLINE HYCLATE 100 MG PO CAPS: 100.0000 mg | ORAL_CAPSULE | Freq: Two times a day (BID) | ORAL | 0 refills | Status: DC
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
## 2017-12-27 NOTE — Patient Instructions (Signed)
I have sent in an antibiotic for you to take twice a day I will notify you of culture results Apply warm compresses 3-4 times a day Follow up in 48 hours, if looks a lot better, you can cancel  Skin Abscess A skin abscess is an infected area on or under your skin that contains a collection of pus and other material. An abscess may also be called a furuncle, carbuncle, or boil. An abscess can occur in or on almost any part of your body. Some abscesses break open (rupture) on their own. Most continue to get worse unless they are treated. The infection can spread deeper into the body and eventually into your blood, which can make you feel ill. Treatment usually involves draining the abscess. What are the causes? An abscess occurs when germs, often bacteria, pass through your skin and cause an infection. This may be caused by:  A scrape or cut on your skin.  A puncture wound through your skin, including a needle injection.  Blocked oil or sweat glands.  Blocked and infected hair follicles.  A cyst that forms beneath your skin (sebaceous cyst) and becomes infected.  What increases the risk? This condition is more likely to develop in people who:  Have a weak body defense system (immune system).  Have diabetes.  Have dry and irritated skin.  Get frequent injections or use illegal IV drugs.  Have a foreign body in a wound, such as a splinter.  Have problems with their lymph system or veins.  What are the signs or symptoms? An abscess may start as a painful, firm bump under the skin. Over time, the abscess may get larger or become softer. Pus may appear at the top of the abscess, causing pressure and pain. It may eventually break through the skin and drain. Other symptoms include:  Redness.  Warmth.  Swelling.  Tenderness.  A sore on the skin.  How is this diagnosed? This condition is diagnosed based on your medical history and a physical exam. A sample of pus may be taken  from the abscess to find out what is causing the infection and what antibiotics can be used to treat it. You also may have:  Blood tests to look for signs of infection or spread of an infection to your blood.  Imaging studies such as ultrasound, CT scan, or MRI if the abscess is deep.  How is this treated? Small abscesses that drain on their own may not need treatment. Treatment for an abscess that does not rupture on its own may include:  Warm compresses applied to the area several times per day.  Incision and drainage. Your health care provider will make an incision to open the abscess and will remove pus and any foreign body or dead tissue. The incision area may be packed with gauze to keep it open for a few days while it heals.  Antibiotic medicines to treat infection. For a severe abscess, you may first get antibiotics through an IV and then change to oral antibiotics.  Follow these instructions at home: Abscess Care  If you have an abscess that has not drained, place a warm, clean, wet washcloth over the abscess several times a day. Do this as told by your health care provider.  Follow instructions from your health care provider about how to take care of your abscess. Make sure you: ? Cover the abscess with a bandage (dressing). ? Change your dressing or gauze as told by your health care  provider. ? Wash your hands with soap and water before you change the dressing or gauze. If soap and water are not available, use hand sanitizer.  Check your abscess every day for signs of a worsening infection. Check for: ? More redness, swelling, or pain. ? More fluid or blood. ? Warmth. ? More pus or a bad smell. Medicines  Take over-the-counter and prescription medicines only as told by your health care provider.  If you were prescribed an antibiotic medicine, take it as told by your health care provider. Do not stop taking the antibiotic even if you start to feel better. General  instructions  To avoid spreading the infection: ? Do not share personal care items, towels, or hot tubs with others. ? Avoid making skin contact with other people.  Keep all follow-up visits as told by your health care provider. This is important. Contact a health care provider if:  You have more redness, swelling, or pain around your abscess.  You have more fluid or blood coming from your abscess.  Your abscess feels warm to the touch.  You have more pus or a bad smell coming from your abscess.  You have a fever.  You have muscle aches.  You have chills or a general ill feeling. Get help right away if:  You have severe pain.  You see red streaks on your skin spreading away from the abscess. This information is not intended to replace advice given to you by your health care provider. Make sure you discuss any questions you have with your health care provider. Document Released: 04/06/2005 Document Revised: 02/21/2016 Document Reviewed: 05/06/2015 Elsevier Interactive Patient Education  [employer].

## 2017-12-28 ENCOUNTER — Encounter (HOSPITAL_COMMUNITY): Payer: Self-pay | Admitting: Nurse Practitioner

## 2017-12-28 ENCOUNTER — Ambulatory Visit (HOSPITAL_COMMUNITY)
Admission: RE | Admit: 2017-12-28 | Discharge: 2017-12-28 | Disposition: A | Discharge status: Home / Self Care | Payer: Medicare Other | Source: Ambulatory Visit | Attending: Nurse Practitioner | Admitting: Nurse Practitioner

## 2017-12-28 VITALS — BP 122/76 | HR 96 | Ht 77.0 in | Wt 325.0 lb

## 2017-12-28 DIAGNOSIS — Z91013 Allergy to seafood: Secondary | ICD-10-CM | POA: Diagnosis not present

## 2017-12-28 DIAGNOSIS — Z833 Family history of diabetes mellitus: Secondary | ICD-10-CM | POA: Diagnosis not present

## 2017-12-28 DIAGNOSIS — I639 Cerebral infarction, unspecified: Secondary | ICD-10-CM | POA: Diagnosis not present

## 2017-12-28 DIAGNOSIS — G4733 Obstructive sleep apnea (adult) (pediatric): Secondary | ICD-10-CM | POA: Diagnosis not present

## 2017-12-28 DIAGNOSIS — Z981 Arthrodesis status: Secondary | ICD-10-CM | POA: Diagnosis not present

## 2017-12-28 DIAGNOSIS — Z79890 Hormone replacement therapy (postmenopausal): Secondary | ICD-10-CM | POA: Diagnosis not present

## 2017-12-28 DIAGNOSIS — Z79899 Other long term (current) drug therapy: Secondary | ICD-10-CM | POA: Insufficient documentation

## 2017-12-28 DIAGNOSIS — I2510 Atherosclerotic heart disease of native coronary artery without angina pectoris: Secondary | ICD-10-CM | POA: Diagnosis not present

## 2017-12-28 DIAGNOSIS — I480 Paroxysmal atrial fibrillation: Secondary | ICD-10-CM

## 2017-12-28 DIAGNOSIS — Z91030 Bee allergy status: Secondary | ICD-10-CM | POA: Diagnosis not present

## 2017-12-28 DIAGNOSIS — I5032 Chronic diastolic (congestive) heart failure: Secondary | ICD-10-CM | POA: Insufficient documentation

## 2017-12-28 DIAGNOSIS — Z823 Family history of stroke: Secondary | ICD-10-CM | POA: Diagnosis not present

## 2017-12-28 DIAGNOSIS — Z7901 Long term (current) use of anticoagulants: Secondary | ICD-10-CM | POA: Diagnosis not present

## 2017-12-28 DIAGNOSIS — G20 Parkinson's disease: Secondary | ICD-10-CM | POA: Insufficient documentation

## 2017-12-28 DIAGNOSIS — Z8673 Personal history of transient ischemic attack (TIA), and cerebral infarction without residual deficits: Secondary | ICD-10-CM | POA: Diagnosis not present

## 2017-12-28 DIAGNOSIS — I110 Hypertensive heart disease with heart failure: Secondary | ICD-10-CM | POA: Insufficient documentation

## 2017-12-28 DIAGNOSIS — I10 Essential (primary) hypertension: Secondary | ICD-10-CM | POA: Diagnosis not present

## 2017-12-28 DIAGNOSIS — L089 Local infection of the skin and subcutaneous tissue, unspecified: Secondary | ICD-10-CM

## 2017-12-28 DIAGNOSIS — Z96652 Presence of left artificial knee joint: Secondary | ICD-10-CM | POA: Insufficient documentation

## 2017-12-28 DIAGNOSIS — I252 Old myocardial infarction: Secondary | ICD-10-CM | POA: Insufficient documentation

## 2017-12-28 DIAGNOSIS — Z8249 Family history of ischemic heart disease and other diseases of the circulatory system: Secondary | ICD-10-CM | POA: Diagnosis not present

## 2017-12-28 DIAGNOSIS — B9562 Methicillin resistant Staphylococcus aureus infection as the cause of diseases classified elsewhere: Secondary | ICD-10-CM

## 2017-12-28 DIAGNOSIS — E039 Hypothyroidism, unspecified: Secondary | ICD-10-CM | POA: Diagnosis not present

## 2017-12-28 DIAGNOSIS — Z6838 Body mass index (BMI) 38.0-38.9, adult: Secondary | ICD-10-CM | POA: Diagnosis not present

## 2017-12-28 DIAGNOSIS — Z885 Allergy status to narcotic agent status: Secondary | ICD-10-CM | POA: Diagnosis not present

## 2017-12-28 DIAGNOSIS — R42 Dizziness and giddiness: Secondary | ICD-10-CM | POA: Diagnosis not present

## 2017-12-28 DIAGNOSIS — A4902 Methicillin resistant Staphylococcus aureus infection, unspecified site: Secondary | ICD-10-CM

## 2017-12-28 HISTORY — DX: Methicillin resistant Staphylococcus aureus infection, unspecified site: A49.02

## 2017-12-28 HISTORY — DX: Methicillin resistant Staphylococcus aureus infection as the cause of diseases classified elsewhere: B95.62

## 2017-12-28 HISTORY — DX: Local infection of the skin and subcutaneous tissue, unspecified: L08.9
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
## 2017-12-29 ENCOUNTER — Ambulatory Visit (INDEPENDENT_AMBULATORY_CARE_PROVIDER_SITE_OTHER): Payer: Medicare Other | Admitting: Family Medicine

## 2017-12-29 ENCOUNTER — Encounter: Payer: Self-pay | Admitting: Family Medicine

## 2017-12-29 VITALS — BP 114/68 | HR 90 | Temp 98.7°F | Wt 325.2 lb

## 2017-12-29 DIAGNOSIS — I6381 Other cerebral infarction due to occlusion or stenosis of small artery: Secondary | ICD-10-CM

## 2017-12-29 DIAGNOSIS — L02415 Cutaneous abscess of right lower limb: Secondary | ICD-10-CM | POA: Diagnosis not present
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
## 2017-12-29 NOTE — Patient Instructions (Signed)
Looks better, continue warm compresses  If not continuing to improve over next 5-7 days, let me know

## 2017-12-30 ENCOUNTER — Other Ambulatory Visit: Payer: Self-pay | Admitting: Endocrinology

## 2017-12-30 LAB — WOUND CULTURE
MICRO NUMBER:: 90734478
SPECIMEN QUALITY:: ADEQUATE

## 2017-12-31 ENCOUNTER — Encounter: Payer: Self-pay | Admitting: Family Medicine

## 2018-01-01 ENCOUNTER — Inpatient Hospital Stay (HOSPITAL_COMMUNITY)
Admission: EM | Admit: 2018-01-01 | Discharge: 2018-01-06 | DRG: 554 | Disposition: A | Discharge status: Home / Self Care | Payer: Medicare Other | Attending: Internal Medicine | Admitting: Internal Medicine

## 2018-01-01 ENCOUNTER — Other Ambulatory Visit: Payer: Self-pay

## 2018-01-01 ENCOUNTER — Encounter (HOSPITAL_COMMUNITY): Payer: Self-pay | Admitting: Emergency Medicine

## 2018-01-01 ENCOUNTER — Ambulatory Visit (INDEPENDENT_AMBULATORY_CARE_PROVIDER_SITE_OTHER): Payer: Medicare Other | Admitting: *Deleted

## 2018-01-01 ENCOUNTER — Emergency Department (HOSPITAL_COMMUNITY): Payer: Medicare Other

## 2018-01-01 DIAGNOSIS — Z8547 Personal history of malignant neoplasm of testis: Secondary | ICD-10-CM

## 2018-01-01 DIAGNOSIS — L03115 Cellulitis of right lower limb: Secondary | ICD-10-CM | POA: Diagnosis present

## 2018-01-01 DIAGNOSIS — N183 Chronic kidney disease, stage 3 (moderate): Secondary | ICD-10-CM | POA: Diagnosis present

## 2018-01-01 DIAGNOSIS — I130 Hypertensive heart and chronic kidney disease with heart failure and stage 1 through stage 4 chronic kidney disease, or unspecified chronic kidney disease: Secondary | ICD-10-CM | POA: Diagnosis present

## 2018-01-01 DIAGNOSIS — Z7901 Long term (current) use of anticoagulants: Secondary | ICD-10-CM | POA: Diagnosis not present

## 2018-01-01 DIAGNOSIS — M10072 Idiopathic gout, left ankle and foot: Secondary | ICD-10-CM | POA: Diagnosis not present

## 2018-01-01 DIAGNOSIS — M109 Gout, unspecified: Secondary | ICD-10-CM | POA: Diagnosis present

## 2018-01-01 DIAGNOSIS — M1A9XX1 Chronic gout, unspecified, with tophus (tophi): Secondary | ICD-10-CM | POA: Diagnosis not present

## 2018-01-01 DIAGNOSIS — G8929 Other chronic pain: Secondary | ICD-10-CM | POA: Diagnosis present

## 2018-01-01 DIAGNOSIS — I5032 Chronic diastolic (congestive) heart failure: Secondary | ICD-10-CM | POA: Diagnosis not present

## 2018-01-01 DIAGNOSIS — Z91013 Allergy to seafood: Secondary | ICD-10-CM

## 2018-01-01 DIAGNOSIS — I639 Cerebral infarction, unspecified: Secondary | ICD-10-CM | POA: Diagnosis not present

## 2018-01-01 DIAGNOSIS — I10 Essential (primary) hypertension: Secondary | ICD-10-CM | POA: Diagnosis present

## 2018-01-01 DIAGNOSIS — Z8673 Personal history of transient ischemic attack (TIA), and cerebral infarction without residual deficits: Secondary | ICD-10-CM | POA: Diagnosis not present

## 2018-01-01 DIAGNOSIS — M545 Low back pain: Secondary | ICD-10-CM | POA: Diagnosis present

## 2018-01-01 DIAGNOSIS — E039 Hypothyroidism, unspecified: Secondary | ICD-10-CM | POA: Diagnosis present

## 2018-01-01 DIAGNOSIS — Z872 Personal history of diseases of the skin and subcutaneous tissue: Secondary | ICD-10-CM

## 2018-01-01 DIAGNOSIS — Z7952 Long term (current) use of systemic steroids: Secondary | ICD-10-CM

## 2018-01-01 DIAGNOSIS — F329 Major depressive disorder, single episode, unspecified: Secondary | ICD-10-CM | POA: Diagnosis present

## 2018-01-01 DIAGNOSIS — I252 Old myocardial infarction: Secondary | ICD-10-CM

## 2018-01-01 DIAGNOSIS — Z8546 Personal history of malignant neoplasm of prostate: Secondary | ICD-10-CM | POA: Diagnosis not present

## 2018-01-01 DIAGNOSIS — G20 Parkinson's disease: Secondary | ICD-10-CM | POA: Diagnosis present

## 2018-01-01 DIAGNOSIS — Z6838 Body mass index (BMI) 38.0-38.9, adult: Secondary | ICD-10-CM

## 2018-01-01 DIAGNOSIS — E271 Primary adrenocortical insufficiency: Secondary | ICD-10-CM | POA: Diagnosis present

## 2018-01-01 DIAGNOSIS — G4733 Obstructive sleep apnea (adult) (pediatric): Secondary | ICD-10-CM | POA: Diagnosis present

## 2018-01-01 DIAGNOSIS — L03116 Cellulitis of left lower limb: Secondary | ICD-10-CM | POA: Diagnosis present

## 2018-01-01 DIAGNOSIS — Z9989 Dependence on other enabling machines and devices: Secondary | ICD-10-CM

## 2018-01-01 DIAGNOSIS — Z888 Allergy status to other drugs, medicaments and biological substances status: Secondary | ICD-10-CM

## 2018-01-01 DIAGNOSIS — E1122 Type 2 diabetes mellitus with diabetic chronic kidney disease: Secondary | ICD-10-CM | POA: Diagnosis present

## 2018-01-01 DIAGNOSIS — Z96652 Presence of left artificial knee joint: Secondary | ICD-10-CM | POA: Diagnosis present

## 2018-01-01 DIAGNOSIS — E2740 Unspecified adrenocortical insufficiency: Secondary | ICD-10-CM | POA: Diagnosis present

## 2018-01-01 DIAGNOSIS — Z79899 Other long term (current) drug therapy: Secondary | ICD-10-CM

## 2018-01-01 DIAGNOSIS — Z981 Arthrodesis status: Secondary | ICD-10-CM

## 2018-01-01 DIAGNOSIS — I2510 Atherosclerotic heart disease of native coronary artery without angina pectoris: Secondary | ICD-10-CM | POA: Diagnosis not present

## 2018-01-01 DIAGNOSIS — I480 Paroxysmal atrial fibrillation: Secondary | ICD-10-CM | POA: Diagnosis present

## 2018-01-01 DIAGNOSIS — Z91038 Other insect allergy status: Secondary | ICD-10-CM

## 2018-01-01 DIAGNOSIS — Z91030 Bee allergy status: Secondary | ICD-10-CM

## 2018-01-01 DIAGNOSIS — G901 Familial dysautonomia [Riley-Day]: Secondary | ICD-10-CM | POA: Diagnosis present

## 2018-01-01 DIAGNOSIS — M25572 Pain in left ankle and joints of left foot: Secondary | ICD-10-CM | POA: Diagnosis not present

## 2018-01-01 DIAGNOSIS — Z8614 Personal history of Methicillin resistant Staphylococcus aureus infection: Secondary | ICD-10-CM

## 2018-01-01 DIAGNOSIS — R6883 Chills (without fever): Secondary | ICD-10-CM | POA: Diagnosis not present

## 2018-01-01 DIAGNOSIS — M25472 Effusion, left ankle: Secondary | ICD-10-CM | POA: Diagnosis not present

## 2018-01-01 HISTORY — DX: Parkinson's disease: G20

## 2018-01-01 HISTORY — DX: Parkinson's disease without dyskinesia, without mention of fluctuations: G20.A1

## 2018-01-01 LAB — CBC WITH DIFFERENTIAL/PLATELET
Abs Immature Granulocytes: 0.1 10*3/uL (ref 0.0–0.1)
Basophils Absolute: 0 10*3/uL (ref 0.0–0.1)
Basophils Relative: 0 %
Eosinophils Absolute: 0.1 10*3/uL (ref 0.0–0.7)
Eosinophils Relative: 1 %
HCT: 54.4 % — ABNORMAL HIGH (ref 39.0–52.0)
Hemoglobin: 17 g/dL (ref 13.0–17.0)
Immature Granulocytes: 1 %
Lymphocytes Relative: 10 %
Lymphs Abs: 1 10*3/uL (ref 0.7–4.0)
MCH: 27.5 pg (ref 26.0–34.0)
MCHC: 31.3 g/dL (ref 30.0–36.0)
MCV: 87.9 fL (ref 78.0–100.0)
Monocytes Absolute: 0.7 10*3/uL (ref 0.1–1.0)
Monocytes Relative: 7 %
Neutro Abs: 7.7 10*3/uL (ref 1.7–7.7)
Neutrophils Relative %: 81 %
Platelets: 254 10*3/uL (ref 150–400)
RBC: 6.19 MIL/uL — ABNORMAL HIGH (ref 4.22–5.81)
RDW: 13.7 % (ref 11.5–15.5)
WBC: 9.5 10*3/uL (ref 4.0–10.5)

## 2018-01-01 LAB — COMPREHENSIVE METABOLIC PANEL
ALT: 6 U/L — ABNORMAL LOW (ref 17–63)
AST: 19 U/L (ref 15–41)
Albumin: 3.9 g/dL (ref 3.5–5.0)
Alkaline Phosphatase: 63 U/L (ref 38–126)
Anion gap: 11 (ref 5–15)
BUN: 18 mg/dL (ref 6–20)
CO2: 24 mmol/L (ref 22–32)
Calcium: 9.9 mg/dL (ref 8.9–10.3)
Chloride: 99 mmol/L — ABNORMAL LOW (ref 101–111)
Creatinine, Ser: 1.25 mg/dL — ABNORMAL HIGH (ref 0.61–1.24)
GFR calc Af Amer: >60 mL/min (ref >60)
GFR calc non Af Amer: 56 mL/min — ABNORMAL LOW (ref >60)
Glucose, Bld: 114 mg/dL — ABNORMAL HIGH (ref 65–99)
Potassium: 3.6 mmol/L (ref 3.5–5.1)
Sodium: 134 mmol/L — ABNORMAL LOW (ref 135–145)
Total Bilirubin: 2 mg/dL — ABNORMAL HIGH (ref 0.3–1.2)
Total Protein: 7.6 g/dL (ref 6.5–8.1)

## 2018-01-01 MED ORDER — ONDANSETRON HCL 4 MG PO TABS: 4.0000 mg | ORAL_TABLET | Freq: Four times a day (QID) | ORAL | Status: DC | PRN

## 2018-01-01 MED ORDER — SODIUM CHLORIDE 0.45 % IV SOLN
INTRAVENOUS | Status: DC
  Administered 2018-01-01 – 2018-01-03 (×3): via INTRAVENOUS

## 2018-01-01 MED ORDER — SERTRALINE HCL 100 MG PO TABS
100.0000 mg | ORAL_TABLET | Freq: Every day | ORAL | Status: DC
  Administered 2018-01-02 – 2018-01-06 (×5): 100 mg via ORAL
  Filled 2018-01-01 (×5): qty 1

## 2018-01-01 MED ORDER — ALLOPURINOL 300 MG PO TABS
600.0000 mg | ORAL_TABLET | Freq: Two times a day (BID) | ORAL | Status: DC
  Administered 2018-01-01 – 2018-01-02 (×2): 600 mg via ORAL
  Filled 2018-01-01 (×2): qty 2

## 2018-01-01 MED ORDER — HYDROCORTISONE 10 MG PO TABS
10.0000 mg | ORAL_TABLET | Freq: Every day | ORAL | Status: DC
  Administered 2018-01-01 – 2018-01-05 (×5): 10 mg via ORAL
  Filled 2018-01-01 (×6): qty 1

## 2018-01-01 MED ORDER — HYDROCORTISONE 20 MG PO TABS
20.0000 mg | ORAL_TABLET | Freq: Every day | ORAL | Status: DC
  Administered 2018-01-02 – 2018-01-06 (×6): 20 mg via ORAL
  Filled 2018-01-01 (×5): qty 1

## 2018-01-01 MED ORDER — ONDANSETRON HCL 4 MG/2ML IJ SOLN: 4.0000 mg | Freq: Four times a day (QID) | INTRAMUSCULAR | Status: DC | PRN

## 2018-01-01 MED ORDER — PREDNISONE 20 MG PO TABS
40.0000 mg | ORAL_TABLET | Freq: Once | ORAL | Status: AC
  Administered 2018-01-01: 40 mg via ORAL
  Filled 2018-01-01: qty 2

## 2018-01-01 MED ORDER — VANCOMYCIN HCL 10 G IV SOLR
1750.0000 mg | INTRAVENOUS | Status: DC
  Administered 2018-01-02: 1750 mg via INTRAVENOUS
  Filled 2018-01-01: qty 1750

## 2018-01-01 MED ORDER — HYDROCORTISONE 10 MG PO TABS: 10.0000 mg | ORAL_TABLET | Freq: Two times a day (BID) | ORAL | Status: DC

## 2018-01-01 MED ORDER — ATORVASTATIN CALCIUM 20 MG PO TABS
20.0000 mg | ORAL_TABLET | Freq: Every day | ORAL | Status: DC
  Administered 2018-01-02 – 2018-01-05 (×4): 20 mg via ORAL
  Filled 2018-01-01 (×4): qty 1

## 2018-01-01 MED ORDER — IBUPROFEN 400 MG PO TABS
600.0000 mg | ORAL_TABLET | Freq: Once | ORAL | Status: AC
  Administered 2018-01-01: 600 mg via ORAL
  Filled 2018-01-01: qty 1

## 2018-01-01 MED ORDER — HYDROMORPHONE HCL 1 MG/ML IJ SOLN
1.0000 mg | Freq: Once | INTRAMUSCULAR | Status: AC
  Administered 2018-01-01: 1 mg via INTRAVENOUS
  Filled 2018-01-01: qty 1

## 2018-01-01 MED ORDER — MORPHINE SULFATE (PF) 2 MG/ML IV SOLN
2.0000 mg | INTRAVENOUS | Status: DC | PRN
  Filled 2018-01-01: qty 1

## 2018-01-01 MED ORDER — CARBIDOPA-LEVODOPA 25-250 MG PO TABS
2.0000 | ORAL_TABLET | Freq: Four times a day (QID) | ORAL | Status: DC
  Administered 2018-01-01 – 2018-01-06 (×18): 2 via ORAL
  Filled 2018-01-01 (×23): qty 2

## 2018-01-01 MED ORDER — OXYCODONE-ACETAMINOPHEN 5-325 MG PO TABS
1.0000 | ORAL_TABLET | Freq: Once | ORAL | Status: AC
  Administered 2018-01-01: 1 via ORAL
  Filled 2018-01-01: qty 1

## 2018-01-01 MED ORDER — COLCHICINE 0.6 MG PO TABS
1.2000 mg | ORAL_TABLET | Freq: Two times a day (BID) | ORAL | Status: DC
  Administered 2018-01-01 – 2018-01-02 (×2): 1.2 mg via ORAL
  Filled 2018-01-01 (×2): qty 2

## 2018-01-01 MED ORDER — FENTANYL CITRATE (PF) 100 MCG/2ML IJ SOLN: 50.0000 ug | Freq: Once | INTRAMUSCULAR | Status: DC

## 2018-01-01 MED ORDER — LEVOTHYROXINE SODIUM 112 MCG PO TABS
112.0000 ug | ORAL_TABLET | Freq: Every day | ORAL | Status: DC
  Administered 2018-01-02 – 2018-01-06 (×5): 112 ug via ORAL
  Filled 2018-01-01 (×5): qty 1

## 2018-01-01 MED ORDER — MIDODRINE HCL 5 MG PO TABS
5.0000 mg | ORAL_TABLET | Freq: Two times a day (BID) | ORAL | Status: DC
  Administered 2018-01-02 – 2018-01-03 (×3): 5 mg via ORAL
  Filled 2018-01-01 (×5): qty 1

## 2018-01-01 MED ORDER — TOPIRAMATE 25 MG PO TABS
50.0000 mg | ORAL_TABLET | Freq: Two times a day (BID) | ORAL | Status: DC
  Administered 2018-01-01 – 2018-01-06 (×10): 50 mg via ORAL
  Filled 2018-01-01 (×10): qty 2

## 2018-01-01 MED ORDER — COLCHICINE 0.6 MG PO TABS
1.2000 mg | ORAL_TABLET | Freq: Once | ORAL | Status: AC
  Administered 2018-01-01: 1.2 mg via ORAL
  Filled 2018-01-01: qty 2

## 2018-01-01 MED ORDER — DOCUSATE SODIUM 100 MG PO CAPS
100.0000 mg | ORAL_CAPSULE | Freq: Two times a day (BID) | ORAL | Status: DC
  Administered 2018-01-04: 100 mg via ORAL
  Filled 2018-01-01 (×7): qty 1

## 2018-01-01 MED ORDER — ADULT MULTIVITAMIN W/MINERALS CH
1.0000 | ORAL_TABLET | Freq: Every day | ORAL | Status: DC
  Administered 2018-01-02 – 2018-01-06 (×5): 1 via ORAL
  Filled 2018-01-01 (×5): qty 1

## 2018-01-01 MED ORDER — APIXABAN 5 MG PO TABS
5.0000 mg | ORAL_TABLET | Freq: Two times a day (BID) | ORAL | Status: DC
  Administered 2018-01-01 – 2018-01-06 (×10): 5 mg via ORAL
  Filled 2018-01-01 (×10): qty 1

## 2018-01-01 MED ORDER — VANCOMYCIN HCL 10 G IV SOLR
2500.0000 mg | Freq: Once | INTRAVENOUS | Status: AC
  Administered 2018-01-01: 2500 mg via INTRAVENOUS
  Filled 2018-01-01 (×2): qty 2500
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
## 2018-01-01 NOTE — Progress Notes (Signed)
Pharmacy Antibiotic Note  Nathanal Hermiz Craigo Sr. is a 72 y.o. male admitted on 01/01/2018 with cellulitis.  Pharmacy has been consulted for vancomycin dosing. Pt is afebrile and WBC is WNL. Scr is elevated at 1.25 which is above his baseline. Pt was on doxy PTA.   Plan: Vancomycin 2500mg  IV x 1 then 1750mg  IV Q24H F/u renal fxn, C&S, clinical status and trough at SS     Temp (24hrs), Avg:98.9 F (37.2 C), Min:98.9 F (37.2 C), Max:98.9 F (37.2 C)  Recent Labs  Lab 01/01/18 1709  WBC 9.5  CREATININE 1.25*    Estimated Creatinine Clearance: 85 mL/min (A) (by C-G formula based on SCr of 1.25 mg/dL (H)).    Allergies  Allergen Reactions  . Bee Venom Anaphylaxis  . Shrimp [Shellfish Allergy] Anaphylaxis and Other (See Comments)    "just shrimp"  . Stadol [Butorphanol] Anaphylaxis and Other (See Comments)    respiratory  Distress, couldn't breathe, cardiac arrest  . Wasp Venom Anaphylaxis    Antimicrobials this admission: Vanc 6/24>>  Dose adjustments this admission: N/A  Microbiology results: Pending  Thank you for allowing pharmacy to be a part of this patient's care.  Troy Hartzog, Rande Lawman 01/01/2018 8:16 PM

## 2018-01-01 NOTE — ED Notes (Signed)
Pt reports he has addisons and parkinsons and that oral medications do not help, needs IV meds.

## 2018-01-01 NOTE — Progress Notes (Signed)
Carelink Summary Report / Loop Recorder 

## 2018-01-01 NOTE — ED Notes (Signed)
Patient transported to x-ray. ?

## 2018-01-01 NOTE — ED Triage Notes (Addendum)
Pt states he is having a gout flare. Pt has noted swelling and redness to the left foot and ankle. Pt also has redness to the right lower leg. Pt states in the past he had to have antibiotics to get rid of this.

## 2018-01-01 NOTE — ED Provider Notes (Signed)
Patient placed in Quick Look pathway, seen and evaluated   Chief Complaint: left foot pain  HPI:   Dennis Skeels Collard Sr. is a 72 y.o. male with multiple medical problems as listed in [place] who presents to the ED with left foot pain. Patient reports that the symptoms started 4 days ago and has gotten worse. Patient has had several episodes of Gout and this feels similar. Patient reports the foot has been red and painful. Patient also scheduled to f/u with urologist.  Patient also has an abscess to the posterior right thigh.    ROS: M/S: left foot pain  Skin: redness left foot, abscess to the right posterior thigh  Physical Exam:  BP 117/74 (BP Location: Left Arm)   Pulse (!) 57   Temp 97.8 F (36.6 C) (Oral)   Resp 14   Ht 6\' 5"  (1.956 m)   Wt (!) 146.5 kg (323 lb)   SpO2 100%   BMI 38.30 kg/m    Gen: No distress  Heart:; tachycardia  Neuro: Awake and Alert  Skin: Raised red tender area to the posterior right thigh. Left foot with swelling, tenderness, erythema that starts at the toes and extends to the lower leg.     Initiation of care has begun. The patient has been counseled on the process, plan, and necessity for staying for the completion/evaluation, and the remainder of the medical screening examination    Ashley Murrain, NP 01/04/18 [place], [place], DO 01/05/18 1427
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
## 2018-01-02 ENCOUNTER — Encounter (HOSPITAL_COMMUNITY): Payer: Self-pay | Admitting: General Practice

## 2018-01-02 ENCOUNTER — Other Ambulatory Visit: Payer: Self-pay

## 2018-01-02 ENCOUNTER — Telehealth: Payer: Self-pay

## 2018-01-02 LAB — COMPREHENSIVE METABOLIC PANEL
ALT: 6 U/L (ref 0–44)
AST: 17 U/L (ref 15–41)
Albumin: 3.4 g/dL — ABNORMAL LOW (ref 3.5–5.0)
Alkaline Phosphatase: 56 U/L (ref 38–126)
Anion gap: 6 (ref 5–15)
BUN: 22 mg/dL (ref 8–23)
CO2: 23 mmol/L (ref 22–32)
Calcium: 9.2 mg/dL (ref 8.9–10.3)
Chloride: 105 mmol/L (ref 98–111)
Creatinine, Ser: 1.15 mg/dL (ref 0.61–1.24)
GFR calc Af Amer: >60 mL/min (ref >60)
GFR calc non Af Amer: >60 mL/min (ref >60)
Glucose, Bld: 165 mg/dL — ABNORMAL HIGH (ref 70–99)
Potassium: 4.4 mmol/L (ref 3.5–5.1)
Sodium: 134 mmol/L — ABNORMAL LOW (ref 135–145)
Total Bilirubin: 1.8 mg/dL — ABNORMAL HIGH (ref 0.3–1.2)
Total Protein: 6.8 g/dL (ref 6.5–8.1)

## 2018-01-02 LAB — CBC
HCT: 51.4 % (ref 39.0–52.0)
Hemoglobin: 15.9 g/dL (ref 13.0–17.0)
MCH: 27.3 pg (ref 26.0–34.0)
MCHC: 30.9 g/dL (ref 30.0–36.0)
MCV: 88.3 fL (ref 78.0–100.0)
Platelets: 227 10*3/uL (ref 150–400)
RBC: 5.82 MIL/uL — ABNORMAL HIGH (ref 4.22–5.81)
RDW: 13.6 % (ref 11.5–15.5)
WBC: 9.1 10*3/uL (ref 4.0–10.5)

## 2018-01-02 LAB — SYNOVIAL CELL COUNT + DIFF, W/ CRYSTALS
Eosinophils-Synovial: 0 % (ref 0–1)
Lymphocytes-Synovial Fld: 2 % (ref 0–20)
Monocyte-Macrophage-Synovial Fluid: 37 % — ABNORMAL LOW (ref 50–90)
Neutrophil, Synovial: 61 % — ABNORMAL HIGH (ref 0–25)
WBC, Synovial: 2775 /mm3 — ABNORMAL HIGH (ref 0–200)

## 2018-01-02 LAB — MRSA PCR SCREENING: MRSA by PCR: NEGATIVE

## 2018-01-02 LAB — URIC ACID: Uric Acid, Serum: 5 mg/dL (ref 3.7–8.6)

## 2018-01-02 MED ORDER — ALLOPURINOL 300 MG PO TABS
300.0000 mg | ORAL_TABLET | Freq: Every day | ORAL | Status: DC
  Administered 2018-01-03 – 2018-01-06 (×4): 300 mg via ORAL
  Filled 2018-01-02 (×4): qty 1

## 2018-01-02 MED ORDER — ENSURE ENLIVE PO LIQD
237.0000 mL | Freq: Two times a day (BID) | ORAL | Status: DC
  Administered 2018-01-02 – 2018-01-03 (×3): 237 mL via ORAL

## 2018-01-02 MED ORDER — COLCHICINE 0.6 MG PO TABS: 0.6000 mg | ORAL_TABLET | Freq: Every day | ORAL | Status: DC

## 2018-01-02 MED ORDER — METHYLPREDNISOLONE ACETATE 40 MG/ML IJ SUSP
10.0000 mg | Freq: Once | INTRAMUSCULAR | Status: AC
  Administered 2018-01-02: 10 mg via INTRA_ARTICULAR
  Filled 2018-01-02: qty 1

## 2018-01-02 MED ORDER — COLCHICINE 0.6 MG PO TABS
0.6000 mg | ORAL_TABLET | Freq: Two times a day (BID) | ORAL | Status: DC
  Administered 2018-01-02 – 2018-01-06 (×7): 0.6 mg via ORAL
  Filled 2018-01-02 (×8): qty 1

## 2018-01-02 MED ORDER — BUPIVACAINE HCL (PF) 0.5 % IJ SOLN
10.0000 mL | Freq: Once | INTRAMUSCULAR | Status: AC
  Administered 2018-01-02: 10 mL
  Filled 2018-01-02 (×2): qty 10
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
## 2018-01-02 NOTE — Procedures (Signed)
Procedure: Left ankle aspiration and injection  Indication: Left ankle effusion(s)  Surgeon: Silvestre Gunner, PA-C  Assist: None  Anesthesia: None  EBL: None  Complications: None  Findings: After risks/benefits explained patient desires to undergo procedure. Consent obtained and time out performed. The left ankle was sterilely prepped and aspirated. 1.71ml clear yellow fluid obtained. 60ml 0.5% Marcaine and 40mg  Depo-Medrol instilled. Pt tolerated the procedure well.    Lisette Abu, PA-C Orthopedic Surgery [phone]

## 2018-01-02 NOTE — Plan of Care (Signed)
  Problem: Activity: Goal: Risk for activity intolerance will decrease Outcome: Progressing   Problem: Pain Managment: Goal: General experience of comfort will improve Outcome: Progressing   Problem: Safety: Goal: Ability to remain free from injury will improve Outcome: Progressing   

## 2018-01-02 NOTE — Telephone Encounter (Signed)
Please Advise

## 2018-01-02 NOTE — Discharge Instructions (Addendum)
Follow with [name], FNP in 5-7 days  Please get a complete blood count and chemistry panel checked by your Primary MD at your next visit, and again as instructed by your Primary MD. Please get your medications reviewed and adjusted by your Primary MD.  Please request your Primary MD to go over all Hospital Tests and Procedure/Radiological results at the follow up, please get all Hospital records sent to your Prim MD by signing hospital release before you go home.  If you had Pneumonia of Lung problems at the Hospital: Please get a 2 view Chest X ray done in 6-8 weeks after hospital discharge or sooner if instructed by your Primary MD.  If you have Congestive Heart Failure: Please call your Cardiologist or Primary MD anytime you have any of the following symptoms:  1) 3 pound weight gain in 24 hours or 5 pounds in 1 week  2) shortness of breath, with or without a dry hacking cough  3) swelling in the hands, feet or stomach  4) if you have to sleep on extra pillows at night in order to breathe  Follow cardiac low salt diet and 1.5 lit/day fluid restriction.  If you have diabetes Accuchecks 4 times/day, Once in AM empty stomach and then before each meal. Log in all results and show them to your primary doctor at your next visit. If any glucose reading is under 80 or above 300 call your primary MD immediately.  If you have Seizure/Convulsions/Epilepsy: Please do not drive, operate heavy machinery, participate in activities at heights or participate in high speed sports until you have seen by Primary MD or a Neurologist and advised to do so again.  If you had Gastrointestinal Bleeding: Please ask your Primary MD to check a complete blood count within one week of discharge or at your next visit. Your endoscopic/colonoscopic biopsies that are pending at the time of discharge, will also need to followed by your Primary MD.  Get Medicines reviewed and adjusted. Please take all your  medications with you for your next visit with your Primary MD  Please request your Primary MD to go over all hospital tests and procedure/radiological results at the follow up, please ask your Primary MD to get all Hospital records sent to his/her office.  If you experience worsening of your admission symptoms, develop shortness of breath, life threatening emergency, suicidal or homicidal thoughts you must seek medical attention immediately by calling 911 or calling your MD immediately  if symptoms less severe.  You must read complete instructions/literature along with all the possible adverse reactions/side effects for all the Medicines you take and that have been prescribed to you. Take any new Medicines after you have completely understood and accpet all the possible adverse reactions/side effects.   Do not drive or operate heavy machinery when taking Pain medications.   Do not take more than prescribed Pain, Sleep and Anxiety Medications  Special Instructions: If you have smoked or chewed Tobacco  in the last 2 yrs please stop smoking, stop any regular Alcohol  and or any Recreational drug use.  Wear Seat belts while driving.  Please note You were cared for by a hospitalist during your hospital stay. If you have any questions about your discharge medications or the care you received while you were in the hospital after you are discharged, you can call the unit and asked to speak with the hospitalist on call if the hospitalist that took care of you is not available.  Once you are discharged, your primary care physician will handle any further medical issues. Please note that NO REFILLS for any discharge medications will be authorized once you are discharged, as it is imperative that you return to your primary care physician (or establish a relationship with a primary care physician if you do not have one) for your aftercare needs so that they can reassess your need for medications and monitor your  lab values.  You can reach the hospitalist office at phone [phone] or fax [phone]   If you do not have a primary care physician, you can call [phone] for a physician referral.  Activity: As tolerated with Full fall precautions use walker/cane & assistance as needed  Diet: regular  Disposition Home     Information on my medicine - ELIQUIS (apixaban)  This medication education was reviewed with me or my healthcare representative as part of my discharge preparation.  The pharmacist that spoke with me during my hospital stay was:  Saundra Shelling, [facility]  Why was Eliquis prescribed for you? Eliquis was prescribed for you to reduce the risk of a blood clot forming that can cause a stroke if you have a medical condition called atrial fibrillation (a type of irregular heartbeat).  What do You need to know about Eliquis ? Take your Eliquis TWICE DAILY - one tablet in the morning and one tablet in the evening with or without food. If you have difficulty swallowing the tablet whole please discuss with your pharmacist how to take the medication safely.  Take Eliquis exactly as prescribed by your doctor and DO NOT stop taking Eliquis without talking to the doctor who prescribed the medication.  Stopping may increase your risk of developing a stroke.  Refill your prescription before you run out.  After discharge, you should have regular check-up appointments with your healthcare provider that is prescribing your Eliquis.  In the future your dose may need to be changed if your kidney function or weight changes by a significant amount or as you get older.  What do you do if you miss a dose? If you miss a dose, take it as soon as you remember on the same day and resume taking twice daily.  Do not take more than one dose of ELIQUIS at the same time to make up a missed dose.  Important Safety Information A possible side effect of Eliquis is bleeding. You should call your healthcare  provider right away if you experience any of the following: ? Bleeding from an injury or your nose that does not stop. ? Unusual colored urine (red or dark brown) or unusual colored stools (red or black). ? Unusual bruising for unknown reasons. ? A serious fall or if you hit your head (even if there is no bleeding).  Some medicines may interact with Eliquis and might increase your risk of bleeding or clotting while on Eliquis. To help avoid this, consult your healthcare provider or pharmacist prior to using any new prescription or non-prescription medications, including herbals, vitamins, non-steroidal anti-inflammatory drugs (NSAIDs) and supplements.  This website has more information on Eliquis (apixaban): http://www.eliquis.com/eliquis/home

## 2018-01-02 NOTE — Telephone Encounter (Signed)
Copied from [place] [phone]. Topic: Inquiry >> Jan 02, 2018  3:11 PM Pricilla Handler wrote: Reason for CRM: Patient called wanting to speak with [name]. Patient is currently admitted to [facility]. Patient states that he wants [name] and [name] to know that he has tested negative there for MRSA.        Thank You!!!

## 2018-01-02 NOTE — Telephone Encounter (Signed)
Noted  

## 2018-01-02 NOTE — Progress Notes (Signed)
PROGRESS NOTE    Dennis Ditommaso Sr.  GUR:[id number] DOB: 1945-12-05 DOA: 01/01/2018 PCP: [name], FNP   Brief Narrative:  Dennis Nations Preziosi Sr. is Dennis [name] 72 y.o. male with medical history significant of gouty arthritis with multiple episodes of gout followed at [place].  Also coronary artery disease, atrial fibrillation, Addison's disease as well as previous cellulitis who presented to the ER with progressive swelling tenderness and redness of his right foot around the ankle.  Patient is unable to put weight on his leg.  He is also having pain at 9 out of 10.  No nausea vomiting or diarrhea.  Patient on chronic steroids.  He has had issues with his bladder including prostate cancer in the past.  He is also had multiple surgeries in his hand for gout in the past.  Assessment & Plan:   Principal Problem:   Acute gouty arthritis Active Problems:   CAD (coronary artery disease)   Chronic diastolic CHF (congestive heart failure) (HCC)   Acquired hypothyroidism   Chronic tophaceous gout   Essential hypertension   Adrenal insufficiency (HCC)   History of cellulitis   History of prostate cancer   Cellulitis of left foot   Gout attack    #1 Cellulitis vs Acute gouty arthritis of the left foot:  Pt has had problems with recurrent gout flares on/off for years.  He describes this as feeling like gout, but notes he's had concurrent gout and cellulitis before as well.  On exam, tenderness to L ankle, but also erythema that seems to spread farther than I'd expect with gout flare. - will continue to treat for cellulitis/gout flare - orthopedics c/s, appreciate recs - Of note, pt on very high dose of colchicine (will decrease back to 0.6 mg BID) - allopurinol also at [facility] very high dose, will decrease to 300 mg daily (discussed with pt) - Last uric acid level 4.9, follow repeat - Aspiration by ortho with monosodium urate crystals, c/w gout flare, but continue abx for now (narrow  as able).  Follow culture. - s/p corticosteroid injection by ortho  #2 coronary artery disease: asymptomatic.  Continue atorvastatin.     #3 Addison's disease: hydrocortisone 20 mg qAM, 10 mg qPM  # Parkinsonism: carbidopa/levodopa  #4 hypertension: Continue home regimen and adjust accordingly.  # Dysautonomia: midodrine  # History CVA: on eliquis (sounds like this was started for this, but pt can't tell me clearly)  #5 diabetes: Last A1c 5.2, follow BG on BMP  #6 chronic kidney disease stage III: interested in renal consult while in hospital, but discussed not currently indication for inpatient c/s and would recommend continuing to f/u outpatient as planned  DVT prophylaxis: eliquis Code Status:  Full  Family Communication: wife at bedside Disposition Plan: pending improvemnt   Consultants:   orthopedics  Procedures:  Left ankle aspiration and injection  Antimicrobials:  Anti-infectives (From admission, onward)   Start     Dose/Rate Route Frequency Ordered Stop   01/02/18 2200  vancomycin (VANCOCIN) 1,750 mg in sodium chloride 0.9 % 500 mL IVPB     1,750 mg 250 mL/hr over 120 Minutes Intravenous Every 24 hours 01/01/18 2015     01/01/18 2015  vancomycin (VANCOCIN) 2,500 mg in sodium chloride 0.9 % 500 mL IVPB     2,500 mg 250 mL/hr over 120 Minutes Intravenous  Once 01/01/18 2013 01/02/18 0157      Subjective: Pain started Thursday Feels like gout But has had gout and  cellulitis before Chills, but no fever  Objective: Vitals:   01/01/18 2115 01/01/18 2212 01/02/18 0411 01/02/18 1317  BP:  124/81 115/81 119/77  Pulse: 83 79 71 (!) 58  Resp:  16 18   Temp:  98.1 F (36.7 C) (!) 97.4 F (36.3 C) 97.7 F (36.5 C)  TempSrc:  Oral Oral Oral  SpO2: 96% 94% 97% 100%  Weight:  (!) 146.5 kg (323 lb)    Height:  6\' 5"  (1.956 m)      Intake/Output Summary (Last 24 hours) at 01/02/2018 1845 Last data filed at 01/02/2018 0600 Gross per 24 hour  Intake  1092.31 ml  Output -  Net 1092.31 ml   Filed Weights   01/01/18 2212  Weight: (!) 146.5 kg (323 lb)    Examination:  General exam: Appears calm and comfortable  Respiratory system: Clear to auscultation. Respiratory effort normal. Cardiovascular system: S1 & S2 heard, RRR. Gastrointestinal system: Abdomen is nondistended, soft and nontender. No organomegaly or masses felt.  Central nervous system: Alert and oriented. No focal neurological deficits. Extremities: L ankle with erythema and swelling, TTP Skin: No rashes, lesions or ulcers Psychiatry: Judgement and insight appear normal. Mood & affect appropriate.     Data Reviewed: I have personally reviewed following labs and imaging studies  CBC: Recent Labs  Lab 01/01/18 1709 01/02/18 0540  WBC 9.5 9.1  NEUTROABS 7.7  --   HGB 17.0 15.9  HCT 54.4* 51.4  MCV 87.9 88.3  PLT 254 951   Basic Metabolic Panel: Recent Labs  Lab 01/01/18 1709 01/02/18 0540  NA 134* 134*  K 3.6 4.4  CL 99* 105  CO2 24 23  GLUCOSE 114* 165*  BUN 18 22  CREATININE 1.25* 1.15  CALCIUM 9.9 9.2   GFR: Estimated Creatinine Clearance: 92.1 mL/min (by C-G formula based on SCr of 1.15 mg/dL). Liver Function Tests: Recent Labs  Lab 01/01/18 1709 01/02/18 0540  AST 19 17  ALT 6* 6  ALKPHOS 63 56  BILITOT 2.0* 1.8*  PROT 7.6 6.8  ALBUMIN 3.9 3.4*   No results for input(s): LIPASE, AMYLASE in the last 168 hours. No results for input(s): AMMONIA in the last 168 hours. Coagulation Profile: No results for input(s): INR, PROTIME in the last 168 hours. Cardiac Enzymes: No results for input(s): CKTOTAL, CKMB, CKMBINDEX, TROPONINI in the last 168 hours. BNP (last 3 results) No results for input(s): PROBNP in the last 8760 hours. HbA1C: No results for input(s): HGBA1C in the last 72 hours. CBG: No results for input(s): GLUCAP in the last 168 hours. Lipid Profile: No results for input(s): CHOL, HDL, LDLCALC, TRIG, CHOLHDL, LDLDIRECT in  the last 72 hours. Thyroid Function Tests: No results for input(s): TSH, T4TOTAL, FREET4, T3FREE, THYROIDAB in the last 72 hours. Anemia Panel: No results for input(s): VITAMINB12, FOLATE, FERRITIN, TIBC, IRON, RETICCTPCT in the last 72 hours. Sepsis Labs: No results for input(s): PROCALCITON, LATICACIDVEN in the last 168 hours.  Recent Results (from the past 240 hour(s))  WOUND CULTURE     Status: Abnormal   Collection Time: 12/27/17  4:57 PM  Result Value Ref Range Status   MICRO NUMBER: [id number]  Final   SPECIMEN QUALITY: ADEQUATE  Final   SOURCE: NOT GIVEN  Final   STATUS: FINAL  Final   GRAM STAIN:   Final    No white blood cells seen No epithelial cells seen No organisms seen   ISOLATE 1: methicillin resistant Staphylococcus aureus ([place])  Final  Comment: Light growth of Methicillin resistant Staphylococcus aureus (MRSA) Negative for inducible clindamycin resistance.      Susceptibility   Methicillin resistant staphylococcus aureus - AEROBIC CULT, GRAM STAIN POSITIVE 1    VANCOMYCIN <=0.5 Sensitive     CIPROFLOXACIN >=8 Resistant     CLINDAMYCIN <=0.25 Sensitive     LEVOFLOXACIN 4 Resistant     ERYTHROMYCIN >=8 Resistant     GENTAMICIN <=0.5 Sensitive     OXACILLIN* NR Resistant      * Oxacillin-resistant staphylococci are resistant toall currently available beta-lactam antimicrobialagents including penicillins, beta lactam/beta-lactamase inhibitor combinations, and cephems withstaphylococcal indications, including Cefazolin.    TETRACYCLINE <=1 Sensitive     TRIMETH/SULFA* <=10 Sensitive      * Oxacillin-resistant staphylococci are resistant toall currently available beta-lactam antimicrobialagents including penicillins, beta lactam/beta-lactamase inhibitor combinations, and cephems withstaphylococcal indications, including Cefazolin.Legend:S = Susceptible  I = IntermediateR = Resistant  NS = Not susceptible* = Not tested  NR = Not reported**NN = See antimicrobic comments  MRSA  PCR Screening     Status: None   Collection Time: 01/02/18 11:10 AM  Result Value Ref Range Status   MRSA by PCR NEGATIVE NEGATIVE Final    Comment:        The GeneXpert MRSA Assay (FDA approved for NASAL specimens only), is one component of [facility] comprehensive MRSA colonization surveillance program. It is not intended to diagnose MRSA infection nor to guide or monitor treatment for MRSA infections. Performed at [facility], [place] [address], [place], [place] [address]   Body fluid culture     Status: None (Preliminary result)   Collection Time: 01/02/18  1:37 PM  Result Value Ref Range Status   Specimen Description SYNOVIAL  Final   Special Requests NONE  Final   Gram Stain   Final    FEW WBC PRESENT, PREDOMINANTLY PMN NO ORGANISMS SEEN Performed at [facility], [place] [address], [place], [place] [address]    Culture PENDING  Incomplete   Report Status PENDING  Incomplete         Radiology Studies: Dg Foot Complete Left  Result Date: 01/01/2018 CLINICAL DATA:  Flare of gout. EXAM: LEFT FOOT - COMPLETE 3+ VIEW COMPARISON:  None. FINDINGS: Soft tissue swelling at first MTP joint is associated with small erosions involving the proximal phalanx and distal metatarsal, with increased density of the soft tissue suggesting tophi. Joint space is preserved. No periarticular osteopenia. No fracture or dislocation. Mild soft tissue swelling ankle. IMPRESSION: Findings consistent with gout involving the first MTP joint. Electronically Signed   By: Staci Righter M.D.   On: 01/01/2018 17:38        Scheduled Meds: . allopurinol  600 mg Oral BID  . apixaban  5 mg Oral BID  . atorvastatin  20 mg Oral q1800  . bupivacaine  10 mL Infiltration Once  . carbidopa-levodopa  2 tablet Oral QID  . colchicine  1.2 mg Oral BID  . docusate sodium  100 mg Oral BID  . feeding supplement (ENSURE ENLIVE)  237 mL Oral BID BM  . hydrocortisone  20 mg Oral Daily   And  . hydrocortisone  10  mg Oral QHS  . levothyroxine  112 mcg Oral QAC breakfast  . methylPREDNISolone acetate  10 mg Intra-articular Once  . midodrine  5 mg Oral BID WC  . multivitamin with minerals  1 tablet Oral Daily  . sertraline  100 mg Oral Daily  . topiramate  50 mg Oral BID   Continuous Infusions: . sodium chloride 75 mL/hr at 01/02/18 1517  . vancomycin       LOS: 1 day    Time spent: over 30 min    Fayrene Helper, MD [facility] Pager [phone]  If 7PM-7AM, please contact night-coverage [email or web address] Password [email or web address] 01/02/2018, 6:45 PM

## 2018-01-03 ENCOUNTER — Ambulatory Visit: Payer: Medicare Other | Admitting: Family Medicine

## 2018-01-03 LAB — BASIC METABOLIC PANEL
Anion gap: 6 (ref 5–15)
BUN: 23 mg/dL (ref 8–23)
CO2: 26 mmol/L (ref 22–32)
Calcium: 8.8 mg/dL — ABNORMAL LOW (ref 8.9–10.3)
Chloride: 104 mmol/L (ref 98–111)
Creatinine, Ser: 1.05 mg/dL (ref 0.61–1.24)
GFR calc Af Amer: >60 mL/min (ref >60)
GFR calc non Af Amer: >60 mL/min (ref >60)
Glucose, Bld: 129 mg/dL — ABNORMAL HIGH (ref 70–99)
Potassium: 4.9 mmol/L (ref 3.5–5.1)
Sodium: 136 mmol/L (ref 135–145)

## 2018-01-03 LAB — CBC
HCT: 47.3 % (ref 39.0–52.0)
Hemoglobin: 14.9 g/dL (ref 13.0–17.0)
MCH: 27.2 pg (ref 26.0–34.0)
MCHC: 31.5 g/dL (ref 30.0–36.0)
MCV: 86.3 fL (ref 78.0–100.0)
Platelets: 247 10*3/uL (ref 150–400)
RBC: 5.48 MIL/uL (ref 4.22–5.81)
RDW: 13.5 % (ref 11.5–15.5)
WBC: 8.8 10*3/uL (ref 4.0–10.5)

## 2018-01-03 LAB — MAGNESIUM: Magnesium: 2.1 mg/dL (ref 1.7–2.4)

## 2018-01-03 MED ORDER — VANCOMYCIN HCL 10 G IV SOLR
1250.0000 mg | Freq: Two times a day (BID) | INTRAVENOUS | Status: DC
  Administered 2018-01-03 – 2018-01-04 (×2): 1250 mg via INTRAVENOUS
  Filled 2018-01-03 (×2): qty 1250

## 2018-01-03 MED ORDER — BACID PO TABS
2.0000 | ORAL_TABLET | Freq: Three times a day (TID) | ORAL | Status: DC
  Administered 2018-01-03 – 2018-01-06 (×8): 2 via ORAL
  Filled 2018-01-03 (×9): qty 2

## 2018-01-03 NOTE — Progress Notes (Signed)
PROGRESS NOTE    Dennis Mees Sr.  NID:[id number] DOB: 1945/09/03 DOA: 01/01/2018 PCP: [name], FNP     Brief Narrative:  Dennis Mccallis a 72 y.o.malewith medical history significant ofgouty arthritis with multiple episodes of gout followed at [place]. Also coronary artery disease, atrial fibrillation, Addison's disease, Parkinson's disease who presented to the ER with progressive swelling tenderness and redness of his left foot around the ankle.Patient is unable to put weight on his leg. He is also having pain at 9 out of 10. Orthopedic surgery was consulted, patient underwent joint aspiration left ankle which revealed intracellular crystals consistent with gout. Due to erythema overlying left ankle, he was also started on IV vanco for cellulitis coverage.   New events last 24 hours / Subjective: Continues to have swelling on his left foot.  He states that he is barely able to put any weight on his foot, requiring a walker with ambulation, which is not at baseline.  He is also requesting regular diet instead of heart healthy.  Assessment & Plan:   Principal Problem:   Acute gouty arthritis Active Problems:   CAD (coronary artery disease)   Chronic diastolic CHF (congestive heart failure) (HCC)   Acquired hypothyroidism   Chronic tophaceous gout   Essential hypertension   Adrenal insufficiency (HCC)   History of cellulitis   History of prostate cancer   Cellulitis of left foot   Gout attack   Acute gouty arthritis of the left foot with possibly overlying cellulitis  -Patient with recurrent gout flares on/off for years.  He describes this as feeling like gout, but notes he's had concurrent gout and cellulitis before as well. -Of note, patient on very high dose of colchicine (will decrease back to 0.6 mg BID) -Allopurinol also at a very high dose, will decrease to 300 mg daily  -Uric acid 5.0  -Aspiration by ortho with monosodium urate  crystals, consistent with gout flare. Gram stain negative.  -S/p corticosteroid injection by ortho -Left foot xray: Findings consistent with gout involving the first MTP joint -Currently on IV vanco. Will deescalate to IV ancef tomorrow if improving   Coronary artery disease -Continue atorvastatin.     Addison's disease -Hydrocortisone 20 mg qAM, 10 mg qPM  Parkinsonism -Continue carbidopa/levodopa  Dysautonomia -Continue midodrine  History CVA -Continue eliquis   Hypothyroidism -Continue synthroid   Depression -Continue zoloft   Diabetes -Blood sugar stable   Chronic kidney disease stage III -States that he is trying to get an outpatient referral for nephrology. Discussed with him that there is no current urgent inpatient indication for consultation and he will need to follow up with PCP for outpatient referral -Cr normal 1.05 today    DVT prophylaxis: Eliquis Code Status: Full Family Communication: Wife at bedside Disposition Plan: Pending improvement   Consultants:   Orthopedic surgery   Procedures:   Left ankle aspiration and injection   Antimicrobials:  Anti-infectives (From admission, onward)   Start     Dose/Rate Route Frequency Ordered Stop   01/03/18 1800  vancomycin (VANCOCIN) 1,250 mg in sodium chloride 0.9 % 250 mL IVPB     1,250 mg 166.7 mL/hr over 90 Minutes Intravenous Every 12 hours 01/03/18 0839     01/02/18 2200  vancomycin (VANCOCIN) 1,750 mg in sodium chloride 0.9 % 500 mL IVPB  Status:  Discontinued     1,750 mg 250 mL/hr over 120 Minutes Intravenous Every 24 hours 01/01/18 2015 01/03/18 0839   01/01/18  2015  vancomycin (VANCOCIN) 2,500 mg in sodium chloride 0.9 % 500 mL IVPB     2,500 mg 250 mL/hr over 120 Minutes Intravenous  Once 01/01/18 2013 01/02/18 0157       Objective: Vitals:   01/02/18 0411 01/02/18 1317 01/02/18 2109 01/03/18 0506  BP: 115/81 119/77 134/84 110/72  Pulse: 71 (!) 58 69 66  Resp: 18  18 17   Temp:  (!) 97.4 F (36.3 C) 97.7 F (36.5 C) 98.1 F (36.7 C) (!) 97.5 F (36.4 C)  TempSrc: Oral Oral Oral Oral  SpO2: 97% 100% 94% 98%  Weight:      Height:        Intake/Output Summary (Last 24 hours) at 01/03/2018 1320 Last data filed at 01/03/2018 0959 Gross per 24 hour  Intake 1974.22 ml  Output 1000 ml  Net 974.22 ml   Filed Weights   01/01/18 2212  Weight: (!) 146.5 kg (323 lb)    Examination:  General exam: Appears calm and comfortable  Respiratory system: Clear to auscultation. Respiratory effort normal. Cardiovascular system: S1 & S2 heard, RRR. No JVD, murmurs, rubs, gallops or clicks.  Gastrointestinal system: Abdomen is nondistended, soft and nontender. No organomegaly or masses felt. Normal bowel sounds heard. Central nervous system: Alert and oriented. No focal neurological deficits. Extremities: Left ankle with significant edema, erythema overlying lateral malleolus, TTP  Skin: No rashes, lesions or ulcers, no open wounds noted  Psychiatry: Judgement and insight appear normal. Mood & affect appropriate.   Data Reviewed: I have personally reviewed following labs and imaging studies  CBC: Recent Labs  Lab 01/01/18 1709 01/02/18 0540 01/03/18 0444  WBC 9.5 9.1 8.8  NEUTROABS 7.7  --   --   HGB 17.0 15.9 14.9  HCT 54.4* 51.4 47.3  MCV 87.9 88.3 86.3  PLT 254 227 505   Basic Metabolic Panel: Recent Labs  Lab 01/01/18 1709 01/02/18 0540 01/03/18 0444  NA 134* 134* 136  K 3.6 4.4 4.9  CL 99* 105 104  CO2 24 23 26   GLUCOSE 114* 165* 129*  BUN 18 22 23   CREATININE 1.25* 1.15 1.05  CALCIUM 9.9 9.2 8.8*  MG  --   --  2.1   GFR: Estimated Creatinine Clearance: 100.8 mL/min (by C-G formula based on SCr of 1.05 mg/dL). Liver Function Tests: Recent Labs  Lab 01/01/18 1709 01/02/18 0540  AST 19 17  ALT 6* 6  ALKPHOS 63 56  BILITOT 2.0* 1.8*  PROT 7.6 6.8  ALBUMIN 3.9 3.4*   No results for input(s): LIPASE, AMYLASE in the last 168 hours. No  results for input(s): AMMONIA in the last 168 hours. Coagulation Profile: No results for input(s): INR, PROTIME in the last 168 hours. Cardiac Enzymes: No results for input(s): CKTOTAL, CKMB, CKMBINDEX, TROPONINI in the last 168 hours. BNP (last 3 results) No results for input(s): PROBNP in the last 8760 hours. HbA1C: No results for input(s): HGBA1C in the last 72 hours. CBG: No results for input(s): GLUCAP in the last 168 hours. Lipid Profile: No results for input(s): CHOL, HDL, LDLCALC, TRIG, CHOLHDL, LDLDIRECT in the last 72 hours. Thyroid Function Tests: No results for input(s): TSH, T4TOTAL, FREET4, T3FREE, THYROIDAB in the last 72 hours. Anemia Panel: No results for input(s): VITAMINB12, FOLATE, FERRITIN, TIBC, IRON, RETICCTPCT in the last 72 hours. Sepsis Labs: No results for input(s): PROCALCITON, LATICACIDVEN in the last 168 hours.  Recent Results (from the past 240 hour(s))  WOUND CULTURE     Status:  Abnormal   Collection Time: 12/27/17  4:57 PM  Result Value Ref Range Status   MICRO NUMBER: [id number]  Final   SPECIMEN QUALITY: ADEQUATE  Final   SOURCE: NOT GIVEN  Final   STATUS: FINAL  Final   GRAM STAIN:   Final    No white blood cells seen No epithelial cells seen No organisms seen   ISOLATE 1: methicillin resistant Staphylococcus aureus (A)  Final    Comment: Light growth of Methicillin resistant Staphylococcus aureus (MRSA) Negative for inducible clindamycin resistance.      Susceptibility   Methicillin resistant staphylococcus aureus - AEROBIC CULT, GRAM STAIN POSITIVE 1    VANCOMYCIN <=0.5 Sensitive     CIPROFLOXACIN >=8 Resistant     CLINDAMYCIN <=0.25 Sensitive     LEVOFLOXACIN 4 Resistant     ERYTHROMYCIN >=8 Resistant     GENTAMICIN <=0.5 Sensitive     OXACILLIN* NR Resistant      * Oxacillin-resistant staphylococci are resistant toall currently available beta-lactam antimicrobialagents including penicillins, beta lactam/beta-lactamase inhibitor  combinations, and cephems withstaphylococcal indications, including Cefazolin.    TETRACYCLINE <=1 Sensitive     TRIMETH/SULFA* <=10 Sensitive      * Oxacillin-resistant staphylococci are resistant toall currently available beta-lactam antimicrobialagents including penicillins, beta lactam/beta-lactamase inhibitor combinations, and cephems withstaphylococcal indications, including Cefazolin.Legend:S = Susceptible  I = IntermediateR = Resistant  NS = Not susceptible* = Not tested  NR = Not reported**NN = See antimicrobic comments  MRSA PCR Screening     Status: None   Collection Time: 01/02/18 11:10 AM  Result Value Ref Range Status   MRSA by PCR NEGATIVE NEGATIVE Final    Comment:        The GeneXpert MRSA Assay (FDA approved for NASAL specimens only), is one component of a comprehensive MRSA colonization surveillance program. It is not intended to diagnose MRSA infection nor to guide or monitor treatment for MRSA infections. Performed at [facility], [place] [address], [place], [place] [address]   Body fluid culture     Status: None (Preliminary result)   Collection Time: 01/02/18  1:37 PM  Result Value Ref Range Status   Specimen Description SYNOVIAL  Final   Special Requests NONE  Final   Gram Stain   Final    FEW WBC PRESENT, PREDOMINANTLY PMN NO ORGANISMS SEEN    Culture   Final    NO GROWTH < 24 HOURS Performed at [place] [address] [place],  [address]    Report Status PENDING  Incomplete       Radiology Studies: Dg Foot Complete Left  Result Date: 01/01/2018 CLINICAL DATA:  Flare of gout. EXAM: LEFT FOOT - COMPLETE 3+ VIEW COMPARISON:  None. FINDINGS: Soft tissue swelling at first MTP joint is associated with small erosions involving the proximal phalanx and distal metatarsal, with increased density of the soft tissue suggesting tophi. Joint space is preserved. No periarticular osteopenia. No fracture or dislocation. Mild soft tissue  swelling ankle. IMPRESSION: Findings consistent with gout involving the first MTP joint. Electronically Signed   By: Staci Righter M.D.   On: 01/01/2018 17:38      Scheduled Meds: . allopurinol  300 mg Oral Daily  . apixaban  5 mg Oral BID  . atorvastatin  20 mg Oral q1800  . carbidopa-levodopa  2 tablet Oral QID  . colchicine  0.6 mg Oral BID  . docusate sodium  100 mg Oral BID  . feeding supplement (  ENSURE ENLIVE)  237 mL Oral BID BM  . hydrocortisone  20 mg Oral Daily   And  . hydrocortisone  10 mg Oral QHS  . levothyroxine  112 mcg Oral QAC breakfast  . midodrine  5 mg Oral BID WC  . multivitamin with minerals  1 tablet Oral Daily  . sertraline  100 mg Oral Daily  . topiramate  50 mg Oral BID   Continuous Infusions: . vancomycin       LOS: 2 days    Time spent: 45 minutes   Dessa Phi, DO [facility] [email or web address] Password [email or web address] 01/03/2018, 1:20 PM

## 2018-01-03 NOTE — Progress Notes (Signed)
Initial Nutrition Assessment  DOCUMENTATION CODES:   Morbid obesity  INTERVENTION:   -Continue Ensure Enlive po BID, each supplement provides 350 kcal and 20 grams of protein -MVI with minerals daily  NUTRITION DIAGNOSIS:   Increased nutrient needs related to wound healing as evidenced by estimated needs.  GOAL:   Patient will meet greater than or equal to 90% of their needs  MONITOR:   PO intake, Supplement acceptance, Labs, Weight trends, Skin, I & O's  REASON FOR ASSESSMENT:   Malnutrition Screening Tool    ASSESSMENT:   Dennis [name] Sr. is a 72 y.o. male with medical history significant of gouty arthritis with multiple episodes of gout followed at [place].  Also coronary artery disease, atrial fibrillation, Addison's disease as well as previous cellulitis who presented to the ER with progressive swelling tenderness and redness of his right foot around the ankle.  Patient is unable to put weight on his leg.   Pt admitted with acute gouty arthritis vs cellulitis of lt foot.   6/25- s/p lt ankle aspiration and injection  Pt resting quietly at time of visit and did not arouse to voice. Observed breakfast tray; pt consumed 100% of meal.   Reviewed wt hx; pt has experienced a 4.1% wt loss over the past 3 months, which is not significant for time frame.   Pt with increased nutrient needs related to wound healing and large stature. Will continue Ensure supplements.   Labs reviewed.   NUTRITION - FOCUSED PHYSICAL EXAM:    Most Recent Value  Orbital Region  No depletion  Upper Arm Region  No depletion  Thoracic and Lumbar Region  No depletion  Buccal Region  No depletion  Temple Region  No depletion  Clavicle Bone Region  No depletion  Clavicle and Acromion Bone Region  No depletion  Scapular Bone Region  No depletion  Dorsal Hand  No depletion  Patellar Region  No depletion  Anterior Thigh Region  No depletion  Posterior Calf Region  No  depletion  Edema (RD Assessment)  Mild  Hair  Reviewed  Eyes  Reviewed  Mouth  Reviewed  Skin  Reviewed  Nails  Reviewed       Diet Order:   Diet Order           Diet regular Room service appropriate? Yes; Fluid consistency: Thin  Diet effective now          EDUCATION NEEDS:   No education needs have been identified at this time  Skin:  Skin Assessment: Reviewed RN Assessment  Last BM:  01/02/18  Height:   Ht Readings from Last 1 Encounters:  01/01/18 6\' 5"  (1.956 m)    Weight:   Wt Readings from Last 1 Encounters:  01/01/18 (!) 323 lb (146.5 kg)    Ideal Body Weight:  94.5 kg  BMI:  Body mass index is 38.3 kg/m.  Estimated Nutritional Needs:   Kcal:  2300-2500  Protein:  140-155 grams  Fluid:  > 2.3 L    Lavergne Hiltunen A. Jimmye Norman, RD, LDN, CDE Pager: [phone] After hours Pager: [phone]

## 2018-01-03 NOTE — Plan of Care (Signed)
  Problem: Health Behavior/Discharge Planning: Goal: Ability to manage health-related needs will improve Outcome: Progressing   Problem: Clinical Measurements: Goal: Will remain free from infection Outcome: Progressing   Problem: Nutrition: Goal: Adequate nutrition will be maintained Outcome: Progressing   Problem: Coping: Goal: Level of anxiety will decrease Outcome: Progressing

## 2018-01-03 NOTE — Progress Notes (Signed)
Pharmacy Antibiotic Note  Dennis Levan Gass Sr. is a 72 y.o. male admitted on 01/01/2018 with cellulitis vs gout flare.  Pharmacy has been consulted for vancomycin dosing.   Renal function is improving.  Afebrile, WBC WNL.   Plan: Change vanc to 1250mg  IV Q12H Monitor renal fxn, clinical progress, vanc trough soon if still on therapy   Height: 6\' 5"  (195.6 cm) Weight: (!) 323 lb (146.5 kg) IBW/kg (Calculated) : 89.1  Temp (24hrs), Avg:97.8 F (36.6 C), Min:97.5 F (36.4 C), Max:98.1 F (36.7 C)  Recent Labs  Lab 01/01/18 1709 01/02/18 0540 01/03/18 0444  WBC 9.5 9.1 8.8  CREATININE 1.25* 1.15 1.05    Estimated Creatinine Clearance: 100.8 mL/min (by C-G formula based on SCr of 1.05 mg/dL).    Allergies  Allergen Reactions  . Bee Venom Anaphylaxis  . Shrimp [Shellfish Allergy] Anaphylaxis and Other (See Comments)    "just shrimp"  . Stadol [Butorphanol] Anaphylaxis and Other (See Comments)    respiratory  Distress, couldn't breathe, cardiac arrest  . Wasp Venom Anaphylaxis    Vanc 6/24 >> Doxy PTA  6/25 synovial fluid - NGTD   Dennis [name], PharmD, BCPS, BCCCP Pager:  [phone] 01/03/2018, 8:38 AM

## 2018-01-03 NOTE — Consult Note (Addendum)
   [facility] CM Inpatient Consult   01/03/2018  Dennis Mirabal Commisso Sr. 1946-01-11 [id number]  Patient screened for restart of services in  [place] for Care Management. Patient is in the [place] [facility] [place] Management services under patient's Medicare  plan.  Met with the patient at the bedside.  Patient states he had been doing pretty well without problems.  He had lost weight, staying active, working and driving again.  He states he has had many medications adjusted.  He states he has no problems with getting to appointments.  No issues with medications or resource needs.  He states he is pursuing his uric acid issues and why he had such a debilitating flare.  He denies any post hospital follow up needs as he states he is pretty independent except he feels he could benefit from some home Physical Therapy to getting back to his baseline or better.  He states he has used [facility] in the past. Encourage patient to speak with Hospitalist or inpatient CM regarding needs.  Patient states no community needs at this time for care or disease management.  Primary Care Provider:  Clarene Reamer, FNP  This office is listed to provide the transition of care follow up calls and appointments.  For questions, please contact:  Natividad Brood, RN BSN [facility] Liaison  [phone] business mobile phone Toll free office [phone]        For questions contact:   Natividad Brood, RN BSN [facility] Liaison  [phone] business mobile phone Toll free office [phone]

## 2018-01-04 LAB — BASIC METABOLIC PANEL
Anion gap: 5 (ref 5–15)
BUN: 20 mg/dL (ref 8–23)
CO2: 26 mmol/L (ref 22–32)
Calcium: 9 mg/dL (ref 8.9–10.3)
Chloride: 107 mmol/L (ref 98–111)
Creatinine, Ser: 0.91 mg/dL (ref 0.61–1.24)
GFR calc Af Amer: >60 mL/min (ref >60)
GFR calc non Af Amer: >60 mL/min (ref >60)
Glucose, Bld: 102 mg/dL — ABNORMAL HIGH (ref 70–99)
Potassium: 4.1 mmol/L (ref 3.5–5.1)
Sodium: 138 mmol/L (ref 135–145)

## 2018-01-04 LAB — CBC
HCT: 47.3 % (ref 39.0–52.0)
Hemoglobin: 14.7 g/dL (ref 13.0–17.0)
MCH: 27.4 pg (ref 26.0–34.0)
MCHC: 31.1 g/dL (ref 30.0–36.0)
MCV: 88.1 fL (ref 78.0–100.0)
Platelets: 241 10*3/uL (ref 150–400)
RBC: 5.37 MIL/uL (ref 4.22–5.81)
RDW: 13.8 % (ref 11.5–15.5)
WBC: 6.6 10*3/uL (ref 4.0–10.5)

## 2018-01-04 MED ORDER — CEFAZOLIN SODIUM-DEXTROSE 1-4 GM/50ML-% IV SOLN
1.0000 g | Freq: Three times a day (TID) | INTRAVENOUS | Status: DC
  Administered 2018-01-04 – 2018-01-06 (×6): 1 g via INTRAVENOUS
  Filled 2018-01-04 (×7): qty 50

## 2018-01-04 NOTE — Progress Notes (Signed)
PROGRESS NOTE    Dennis Pavlak Sr.  RDE:[id number] DOB: [birth year] DOA: 01/01/2018 PCP: [name], FNP     Brief Narrative:  Dennis Mccallis a 72 y.o.malewith medical history significant ofgouty arthritis with multiple episodes of gout followed at [facility]. Also coronary artery disease, atrial fibrillation, Addison's disease, Parkinson's disease who presented to the ER with progressive swelling tenderness and redness of his left foot around the ankle.Patient is unable to put weight on his leg. He is also having pain at 9 out of 10. Orthopedic surgery was consulted, patient underwent joint aspiration left ankle which revealed intracellular crystals consistent with gout. Due to erythema overlying left ankle, he was also started on IV vanco for cellulitis coverage.   New events last 24 hours / Subjective: Continues to have swelling on his left foot but states it is slowly improving. No other issues/complaints.   Assessment & Plan:   Principal Problem:   Acute gouty arthritis Active Problems:   CAD (coronary artery disease)   Chronic diastolic CHF (congestive heart failure) (HCC)   Acquired hypothyroidism   Chronic tophaceous gout   Essential hypertension   Adrenal insufficiency (HCC)   History of cellulitis   History of prostate cancer   Cellulitis of left foot   Gout attack   Acute gouty arthritis of the left foot with possibly overlying cellulitis  -Patient with recurrent gout flares on/off for years.  He describes this as feeling like gout, but notes he's had concurrent gout and cellulitis before as well. -Of note, patient on very high dose of colchicine (will decrease back to 0.6 mg BID) -Allopurinol also at a very high dose, will decrease to 300 mg daily  -Uric acid 5.0  -Aspiration by ortho with monosodium urate crystals, consistent with gout flare. Gram stain negative.  -S/p corticosteroid injection by ortho -Left foot xray: Findings  consistent with gout involving the first MTP joint -Vanco --> change to Ancef today  -PT ordered   Coronary artery disease -Continue atorvastatin   Addison's disease -Hydrocortisone 20 mg qAM, 10 mg qPM  Parkinsonism -Continue carbidopa/levodopa  Dysautonomia -Continue midodrine  History CVA -Continue eliquis   Hypothyroidism -Continue synthroid   Depression -Continue zoloft   Diabetes -Blood sugar stable   Chronic kidney disease stage III -States that he is trying to get an outpatient referral for nephrology. Discussed with him that there is no current urgent inpatient indication for consultation and he will need to follow up with PCP for outpatient referral -Cr normal 0.91 today    DVT prophylaxis: Eliquis Code Status: Full Family Communication: No family at bedside Disposition Plan: Pending improvement, PT eval    Consultants:   Orthopedic surgery   Procedures:   Left ankle aspiration and injection   Antimicrobials:  Anti-infectives (From admission, onward)   Start     Dose/Rate Route Frequency Ordered Stop   01/04/18 1400  ceFAZolin (ANCEF) IVPB 1 g/50 mL premix     1 g 100 mL/hr over 30 Minutes Intravenous Every 8 hours 01/04/18 1034     01/03/18 1800  vancomycin (VANCOCIN) 1,250 mg in sodium chloride 0.9 % 250 mL IVPB  Status:  Discontinued     1,250 mg 166.7 mL/hr over 90 Minutes Intravenous Every 12 hours 01/03/18 0839 01/04/18 1034   01/02/18 2200  vancomycin (VANCOCIN) 1,750 mg in sodium chloride 0.9 % 500 mL IVPB  Status:  Discontinued     1,750 mg 250 mL/hr over 120 Minutes Intravenous Every 24  hours 01/01/18 2015 01/03/18 0839   01/01/18 2015  vancomycin (VANCOCIN) 2,500 mg in sodium chloride 0.9 % 500 mL IVPB     2,500 mg 250 mL/hr over 120 Minutes Intravenous  Once 01/01/18 2013 01/02/18 0157       Objective: Vitals:   01/03/18 0506 01/03/18 1439 01/03/18 2116 01/04/18 0534  BP: 110/72 107/71 109/72 122/79  Pulse: 66 66 64 (!)  55  Resp: 17  20 20   Temp: (!) 97.5 F (36.4 C) 97.9 F (36.6 C) 97.8 F (36.6 C) 97.7 F (36.5 C)  TempSrc: Oral Oral Oral Oral  SpO2: 98% 97% 100% 100%  Weight:      Height:        Intake/Output Summary (Last 24 hours) at 01/04/2018 1231 Last data filed at 01/04/2018 0900 Gross per 24 hour  Intake 1010 ml  Output 1300 ml  Net -290 ml   Filed Weights   01/01/18 2212  Weight: (!) 146.5 kg (323 lb)     Examination: General exam: Appears calm and comfortable  Respiratory system: Clear to auscultation. Respiratory effort normal. Cardiovascular system: S1 & S2 heard, RRR. No JVD, murmurs, rubs, gallops or clicks. No pedal edema. Gastrointestinal system: Abdomen is nondistended, soft and nontender. No organomegaly or masses felt. Normal bowel sounds heard. Central nervous system: Alert and oriented. No focal neurological deficits. Extremities: +Left ankle with edema, improved from yesterday's exam, residual erythema also improving  Skin: No rashes, lesions or ulcers, open wounds  Psychiatry: Judgement and insight appear normal. Mood & affect appropriate.    Data Reviewed: I have personally reviewed following labs and imaging studies  CBC: Recent Labs  Lab 01/01/18 1709 01/02/18 0540 01/03/18 0444 01/04/18 0605  WBC 9.5 9.1 8.8 6.6  NEUTROABS 7.7  --   --   --   HGB 17.0 15.9 14.9 14.7  HCT 54.4* 51.4 47.3 47.3  MCV 87.9 88.3 86.3 88.1  PLT 254 227 247 568   Basic Metabolic Panel: Recent Labs  Lab 01/01/18 1709 01/02/18 0540 01/03/18 0444 01/04/18 0605  NA 134* 134* 136 138  K 3.6 4.4 4.9 4.1  CL 99* 105 104 107  CO2 24 23 26 26   GLUCOSE 114* 165* 129* 102*  BUN 18 22 23 20   CREATININE 1.25* 1.15 1.05 0.91  CALCIUM 9.9 9.2 8.8* 9.0  MG  --   --  2.1  --    GFR: Estimated Creatinine Clearance: 116.3 mL/min (by C-G formula based on SCr of 0.91 mg/dL). Liver Function Tests: Recent Labs  Lab 01/01/18 1709 01/02/18 0540  AST 19 17  ALT 6* 6  ALKPHOS  63 56  BILITOT 2.0* 1.8*  PROT 7.6 6.8  ALBUMIN 3.9 3.4*   No results for input(s): LIPASE, AMYLASE in the last 168 hours. No results for input(s): AMMONIA in the last 168 hours. Coagulation Profile: No results for input(s): INR, PROTIME in the last 168 hours. Cardiac Enzymes: No results for input(s): CKTOTAL, CKMB, CKMBINDEX, TROPONINI in the last 168 hours. BNP (last 3 results) No results for input(s): PROBNP in the last 8760 hours. HbA1C: No results for input(s): HGBA1C in the last 72 hours. CBG: No results for input(s): GLUCAP in the last 168 hours. Lipid Profile: No results for input(s): CHOL, HDL, LDLCALC, TRIG, CHOLHDL, LDLDIRECT in the last 72 hours. Thyroid Function Tests: No results for input(s): TSH, T4TOTAL, FREET4, T3FREE, THYROIDAB in the last 72 hours. Anemia Panel: No results for input(s): VITAMINB12, FOLATE, FERRITIN, TIBC, IRON, RETICCTPCT in  the last 72 hours. Sepsis Labs: No results for input(s): PROCALCITON, LATICACIDVEN in the last 168 hours.  Recent Results (from the past 240 hour(s))  WOUND CULTURE     Status: Abnormal   Collection Time: 12/27/17  4:57 PM  Result Value Ref Range Status   MICRO NUMBER: [id number]  Final   SPECIMEN QUALITY: ADEQUATE  Final   SOURCE: NOT GIVEN  Final   STATUS: FINAL  Final   GRAM STAIN:   Final    No white blood cells seen No epithelial cells seen No organisms seen   ISOLATE 1: methicillin resistant Staphylococcus aureus (A)  Final    Comment: Light growth of Methicillin resistant Staphylococcus aureus (MRSA) Negative for inducible clindamycin resistance.      Susceptibility   Methicillin resistant staphylococcus aureus - AEROBIC CULT, GRAM STAIN POSITIVE 1    VANCOMYCIN <=0.5 Sensitive     CIPROFLOXACIN >=8 Resistant     CLINDAMYCIN <=0.25 Sensitive     LEVOFLOXACIN 4 Resistant     ERYTHROMYCIN >=8 Resistant     GENTAMICIN <=0.5 Sensitive     OXACILLIN* NR Resistant      * Oxacillin-resistant staphylococci are  resistant toall currently available beta-lactam antimicrobialagents including penicillins, beta lactam/beta-lactamase inhibitor combinations, and cephems withstaphylococcal indications, including Cefazolin.    TETRACYCLINE <=1 Sensitive     TRIMETH/SULFA* <=10 Sensitive      * Oxacillin-resistant staphylococci are resistant toall currently available beta-lactam antimicrobialagents including penicillins, beta lactam/beta-lactamase inhibitor combinations, and cephems withstaphylococcal indications, including Cefazolin.Legend:S = Susceptible  I = IntermediateR = Resistant  NS = Not susceptible* = Not tested  NR = Not reported**NN = See antimicrobic comments  MRSA PCR Screening     Status: None   Collection Time: 01/02/18 11:10 AM  Result Value Ref Range Status   MRSA by PCR NEGATIVE NEGATIVE Final    Comment:        The GeneXpert MRSA Assay (FDA approved for NASAL specimens only), is one component of a comprehensive MRSA colonization surveillance program. It is not intended to diagnose MRSA infection nor to guide or monitor treatment for MRSA infections. Performed at [facility], [place] [address], [place], [place] [address]   Body fluid culture     Status: None (Preliminary result)   Collection Time: 01/02/18  1:37 PM  Result Value Ref Range Status   Specimen Description SYNOVIAL  Final   Special Requests NONE  Final   Gram Stain   Final    FEW WBC PRESENT, PREDOMINANTLY PMN NO ORGANISMS SEEN    Culture   Final    NO GROWTH 2 DAYS Performed at [facility] [address], [place], [place] [address]    Report Status PENDING  Incomplete       Radiology Studies: No results found.    Scheduled Meds: . allopurinol  300 mg Oral Daily  . apixaban  5 mg Oral BID  . atorvastatin  20 mg Oral q1800  . carbidopa-levodopa  2 tablet Oral QID  . colchicine  0.6 mg Oral BID  . docusate sodium  100 mg Oral BID  . feeding supplement (ENSURE ENLIVE)  237 mL Oral BID BM  .  hydrocortisone  20 mg Oral Daily   And  . hydrocortisone  10 mg Oral QHS  . lactobacillus acidophilus  2 tablet Oral TID  . levothyroxine  112 mcg Oral QAC breakfast  . midodrine  5 mg Oral BID WC  . multivitamin with minerals  1 tablet Oral Daily  . sertraline  100 mg Oral Daily  . topiramate  50 mg Oral BID   Continuous Infusions: .  ceFAZolin (ANCEF) IV       LOS: 3 days    Time spent: 20 minutes   Dessa Phi, DO [facility] [email or web address] Password [facility] 01/04/2018, 12:31 PM

## 2018-01-04 NOTE — Care Management Important Message (Signed)
Important Message  Patient Details  Name: Dennis Borge Mom Sr. MRN: [id number] Date of Birth: Jun 05, 1946   Medicare Important Message Given:  Yes    [name] 01/04/2018, 2:40 PM

## 2018-01-04 NOTE — Progress Notes (Signed)
PT Cancellation Note  Patient Details Name: Dennis [name] Sr. MRN: [id number] DOB: Jun 01, 1946   Cancelled Treatment:    Reason Eval/Treat Not Completed: Patient declined, reports scheduled conference call in 5 minutes.  Will f/u as able.    Michel Santee 01/04/2018, 2:45 PM

## 2018-01-05 DIAGNOSIS — M109 Gout, unspecified: Secondary | ICD-10-CM

## 2018-01-05 LAB — BASIC METABOLIC PANEL
Anion gap: 5 (ref 5–15)
BUN: 16 mg/dL (ref 8–23)
CO2: 26 mmol/L (ref 22–32)
Calcium: 9.2 mg/dL (ref 8.9–10.3)
Chloride: 107 mmol/L (ref 98–111)
Creatinine, Ser: 0.97 mg/dL (ref 0.61–1.24)
GFR calc Af Amer: >60 mL/min (ref >60)
GFR calc non Af Amer: >60 mL/min (ref >60)
Glucose, Bld: 92 mg/dL (ref 70–99)
Potassium: 4.2 mmol/L (ref 3.5–5.1)
Sodium: 138 mmol/L (ref 135–145)

## 2018-01-05 LAB — CBC
HCT: 51.5 % (ref 39.0–52.0)
Hemoglobin: 16.2 g/dL (ref 13.0–17.0)
MCH: 27.7 pg (ref 26.0–34.0)
MCHC: 31.5 g/dL (ref 30.0–36.0)
MCV: 88 fL (ref 78.0–100.0)
Platelets: 264 10*3/uL (ref 150–400)
RBC: 5.85 MIL/uL — ABNORMAL HIGH (ref 4.22–5.81)
RDW: 13.6 % (ref 11.5–15.5)
WBC: 6.5 10*3/uL (ref 4.0–10.5)

## 2018-01-05 NOTE — Evaluation (Signed)
Physical Therapy Evaluation Patient Details Name: Dennis Freeney Diego Sr. MRN: [id number] DOB: [birth year] Today's Date: 01/05/2018   History of Present Illness  72 y.o. male admitted with progressive swelling/tenderness of L foot and ankle. Now s/p aspiration and treatment for gout and cellulitis. PMH includes: multi episodes of gouty arthritis, CKDIII, Parkinson's Disease , CAD, A-fib, Addison's disease, MI, HTN, Stroke, testicular CA, Diastolic CHF, L TKA, Shoulder surgery, lumbar fusion, foot surgery.    Clinical Impression  Patient evaluated by Physical Therapy with no further acute PT needs identified. All education has been completed and the patient has no further questions. PTA, pt living with wife and son, mod I with all mobility and ADLs. Upon eval pt ambulating hallway 9' with supervision and use of RW. Limited by parkinsonian tremors and fatigue, however pt demonstrates good safety awareness and appropriately limits his mobility when needed. No overt LOB or concern from PT standpoint to return home when medically ready. Rec OP NEURO PT for patient to assist in treatment of PD. See below for any follow-up Physical Therapy or equipment needs. PT is signing off. Thank you for this referral.     Follow Up Recommendations Outpatient PT(Neuro PT)    Equipment Recommendations  None recommended by PT    Recommendations for Other Services       Precautions / Restrictions Restrictions Weight Bearing Restrictions: No      Mobility  Bed Mobility Overal bed mobility: Modified Independent                Transfers Overall transfer level: Modified independent Equipment used: Rolling walker (2 wheeled);None             General transfer comment: Able to stand without assistance or AD  Ambulation/Gait Ambulation/Gait assistance: Min guard Gait Distance (Feet): 60 Feet Assistive device: Rolling walker (2 wheeled) Gait Pattern/deviations: Step-to pattern     General Gait  Details: Patient ambulating with RW and no overt LOB. bearing weight on ankle which he reports to feeling be better. Fatigues quickly with standing rest break, parkinsonian tremors noted during session. Pt has good safety awareness and knows his limits ambulating. no concerns from PT standpoint.   Stairs            Wheelchair Mobility    Modified Rankin (Stroke Patients Only)       Balance Overall balance assessment: Needs assistance   Sitting balance-Leahy Scale: Good       Standing balance-Leahy Scale: Fair                               Pertinent Vitals/Pain Pain Assessment: Faces Faces Pain Scale: Hurts little more Pain Location: L ankle  Pain Descriptors / Indicators: Burning;Discomfort Pain Intervention(s): Limited activity within patient's tolerance;Monitored during session;Premedicated before session;Repositioned    Home Living Family/patient expects to be discharged to:: Private residence Living Arrangements: Spouse/significant other Available Help at Discharge: Family;Available 24 hours/day Type of Home: House Home Access: Ramped entrance     Home Layout: Two level;Able to live on main level with bedroom/bathroom Home Equipment: Kasandra Knudsen - single point;Shower seat;Grab bars - tub/shower;Electric scooter      Prior Function Level of Independence: Independent with assistive device(s)               Hand Dominance        Extremity/Trunk Assessment   Upper Extremity Assessment Upper Extremity Assessment: Overall WFL for tasks assessed  Lower Extremity Assessment Lower Extremity Assessment: Overall WFL for tasks assessed       Communication      Cognition Arousal/Alertness: Awake/alert Behavior During Therapy: WFL for tasks assessed/performed Overall Cognitive Status: Within Functional Limits for tasks assessed                                        General Comments      Exercises     Assessment/Plan     PT Assessment All further PT needs can be met in the next venue of care  PT Problem List Decreased activity tolerance;Decreased balance;Pain       PT Treatment Interventions      PT Goals (Current goals can be found in the Care Plan section)  Acute Rehab PT Goals Patient Stated Goal: go home tomorrow PT Goal Formulation: With patient Time For Goal Achievement: 01/12/18 Potential to Achieve Goals: Good    Frequency     Barriers to discharge        Co-evaluation               AM-PAC PT "6 Clicks" Daily Activity  Outcome Measure Difficulty turning over in bed (including adjusting bedclothes, sheets and blankets)?: None Difficulty moving from lying on back to sitting on the side of the bed? : None Difficulty sitting down on and standing up from a chair with arms (e.g., wheelchair, bedside commode, etc,.)?: None Help needed moving to and from a bed to chair (including a wheelchair)?: A Little Help needed walking in hospital room?: A Little Help needed climbing 3-5 steps with a railing? : A Little 6 Click Score: 21    End of Session Equipment Utilized During Treatment: Gait belt Activity Tolerance: Patient tolerated treatment well Patient left: in bed;with call bell/phone within reach Nurse Communication: Mobility status PT Visit Diagnosis: Pain Pain - Right/Left: Left Pain - part of body: Ankle and joints of foot    Time: 1600-1630 PT Time Calculation (min) (ACUTE ONLY): 30 min   Charges:   PT Evaluation $PT Eval Moderate Complexity: 1 Mod PT Treatments $Gait Training: 8-22 mins   PT G Codes:        Reinaldo Berber, PT, DPT Acute Rehab Services Pager: [phone]    Reinaldo Berber 01/05/2018, 4:48 PM

## 2018-01-05 NOTE — Progress Notes (Signed)
PROGRESS NOTE  Dennis Reier Sr. TFT:[id number] DOB: May 05, 1946 DOA: 01/01/2018 PCP: [name], FNP   LOS: 4 days   Brief Narrative / Interim history: 72 year old male with history of gouty arthritis with multiple recurrent episodes of gout, followed by orthopedics as an outpatient, coronary artery disease, A. fib, Addison's disease, Parkinson's disease who came to the ED on 6/24 with left foot / left ankle swelling.  Orthopedic surgery was consulted, he underwent joint aspiration which revealed intracellular crystals consistent with gout.  Due to erythema he was also started on IV antibiotics for cellulitis coverage  Assessment & Plan: Principal Problem:   Acute gouty arthritis Active Problems:   CAD (coronary artery disease)   Chronic diastolic CHF (congestive heart failure) (HCC)   Acquired hypothyroidism   Chronic tophaceous gout   Essential hypertension   Adrenal insufficiency (HCC)   History of cellulitis   History of prostate cancer   Cellulitis of left foot   Gout attack   Acute gouty arthritis of the left foot with possibly overlying cellulitis  -Patient with recurrent gout flares on/off for years. He describes this as feeling like gout, but notes he's had concurrent gout and cellulitis before as well. -Of note, patient on very high dose of colchicine (will decrease back to 0.6 mg BID) -Allopurinol also at a very high dose, will decrease to 300 mg daily  -Uric acid 5.0  -Aspiration by ortho with monosodium urate crystals, consistent with gout flare. Gram stain negative.  -S/p corticosteroid injection by ortho -Left foot xray: Findings consistent with gout involving the first MTP joint -Cellulitis improving, he has been transitioned to Ancef and will continue for now.  Plan to transition to p.o. antibiotics on discharge for total of 7 days -PT still to evaluate  Coronary artery disease -Continue atorvastatin   Addison's disease -Hydrocortisone 20 mg qAM,  10 mg qPM -Being followed by Dr. Dwyane Dee from endocrinology  Parkinsonism -Continue carbidopa/levodopa, his neurologist is at [facility]  History CVA -Continue eliquis   Hypothyroidism -Continue synthroid   Depression -Continue zoloft   Diabetes -Blood sugar stable   Chronic kidney disease stage III -States that he is trying to get an outpatient referral for nephrology. Discussed with him that there is no current urgent inpatient indication for consultation and he will need to follow up with PCP for outpatient referral -Creatinine has normalized and has remained normal.    DVT prophylaxis: Eliquis Code Status: Full code Family Communication: no family at bedside Disposition Plan: home when ready   Consultants:   Orthopedic surgery   Procedures:   Left ankle aspiration and injection  Antimicrobials:  Vancomycin 6/24 >> 6/26  Ancef 6/26 >>  Subjective: -His left foot swelling is improving, he is able to ambulate with less pain.  Denies any fever or chills.  Objective: Vitals:   01/04/18 1359 01/04/18 2107 01/05/18 0525 01/05/18 1348  BP: 117/74 130/81 120/75 (!) 136/94  Pulse: (!) 57 63 (!) 57 66  Resp: 14 17 17 18   Temp: 97.8 F (36.6 C) (!) 97.5 F (36.4 C) (!) 97.4 F (36.3 C) (!) 97.5 F (36.4 C)  TempSrc: Oral Oral Oral Oral  SpO2: 100% 100% 100% 100%  Weight:      Height:        Intake/Output Summary (Last 24 hours) at 01/05/2018 1423 Last data filed at 01/05/2018 1409 Gross per 24 hour  Intake 630 ml  Output 2175 ml  Net -1545 ml  Filed Weights   01/01/18 2212  Weight: (!) 146.5 kg (323 lb)    Examination:  Constitutional: NAD Eyes: lids and conjunctivae normal ENMT: Mucous membranes are moist.  Respiratory: clear to auscultation bilaterally, no wheezing, no crackles. Normal respiratory effort. No accessory muscle use.  Cardiovascular: Regular rate and rhythm, no murmurs / rubs / gallops. No LE  edema. Abdomen: no tenderness. Bowel sounds positive.  Skin: Left ankle tender to palpation, slight erythematous rash overlying the lateral aspect of the ankle Neurologic: CN 2-12 grossly intact. Strength 5/5 in all 4.  Psychiatric: Normal judgment and insight. Alert and oriented x 3. Normal mood.    Data Reviewed: I have independently reviewed following labs and imaging studies   CBC: Recent Labs  Lab 01/01/18 1709 01/02/18 0540 01/03/18 0444 01/04/18 0605 01/05/18 0726  WBC 9.5 9.1 8.8 6.6 6.5  NEUTROABS 7.7  --   --   --   --   HGB 17.0 15.9 14.9 14.7 16.2  HCT 54.4* 51.4 47.3 47.3 51.5  MCV 87.9 88.3 86.3 88.1 88.0  PLT 254 227 247 241 329   Basic Metabolic Panel: Recent Labs  Lab 01/01/18 1709 01/02/18 0540 01/03/18 0444 01/04/18 0605 01/05/18 0726  NA 134* 134* 136 138 138  K 3.6 4.4 4.9 4.1 4.2  CL 99* 105 104 107 107  CO2 24 23 26 26 26   GLUCOSE 114* 165* 129* 102* 92  BUN 18 22 23 20 16   CREATININE 1.25* 1.15 1.05 0.91 0.97  CALCIUM 9.9 9.2 8.8* 9.0 9.2  MG  --   --  2.1  --   --    GFR: Estimated Creatinine Clearance: 109.1 mL/min (by C-G formula based on SCr of 0.97 mg/dL). Liver Function Tests: Recent Labs  Lab 01/01/18 1709 01/02/18 0540  AST 19 17  ALT 6* 6  ALKPHOS 63 56  BILITOT 2.0* 1.8*  PROT 7.6 6.8  ALBUMIN 3.9 3.4*   No results for input(s): LIPASE, AMYLASE in the last 168 hours. No results for input(s): AMMONIA in the last 168 hours. Coagulation Profile: No results for input(s): INR, PROTIME in the last 168 hours. Cardiac Enzymes: No results for input(s): CKTOTAL, CKMB, CKMBINDEX, TROPONINI in the last 168 hours. BNP (last 3 results) No results for input(s): PROBNP in the last 8760 hours. HbA1C: No results for input(s): HGBA1C in the last 72 hours. CBG: No results for input(s): GLUCAP in the last 168 hours. Lipid Profile: No results for input(s): CHOL, HDL, LDLCALC, TRIG, CHOLHDL, LDLDIRECT in the last 72 hours. Thyroid  Function Tests: No results for input(s): TSH, T4TOTAL, FREET4, T3FREE, THYROIDAB in the last 72 hours. Anemia Panel: No results for input(s): VITAMINB12, FOLATE, FERRITIN, TIBC, IRON, RETICCTPCT in the last 72 hours. Urine analysis:    Component Value Date/Time   COLORURINE YELLOW (A) 05/27/2017 1643   APPEARANCEUR CLEAR (A) 05/27/2017 1643   APPEARANCEUR Clear 08/11/2012 2032   LABSPEC 1.021 05/27/2017 1643   LABSPEC 1.017 08/11/2012 2032   PHURINE 5.0 05/27/2017 1643   GLUCOSEU NEGATIVE 05/27/2017 1643   GLUCOSEU Negative 08/11/2012 2032   HGBUR NEGATIVE 05/27/2017 1643   BILIRUBINUR negative 12/20/2017 1104   BILIRUBINUR Negative 08/11/2012 2032   KETONESUR 5 (A) 05/27/2017 1643   PROTEINUR Negative 12/20/2017 1104   PROTEINUR NEGATIVE 05/27/2017 1643   UROBILINOGEN 0.2 12/20/2017 1104   UROBILINOGEN 2.0 (H) 08/04/2014 2344   NITRITE negative 12/20/2017 1104   NITRITE NEGATIVE 05/27/2017 1643   LEUKOCYTESUR Moderate (2+) (A) 12/20/2017 1104  LEUKOCYTESUR Trace 08/11/2012 2032   Sepsis Labs: Invalid input(s): PROCALCITONIN, LACTICIDVEN  Recent Results (from the past 240 hour(s))  WOUND CULTURE     Status: Abnormal   Collection Time: 12/27/17  4:57 PM  Result Value Ref Range Status   MICRO NUMBER: [id number]  Final   SPECIMEN QUALITY: ADEQUATE  Final   SOURCE: NOT GIVEN  Final   STATUS: FINAL  Final   GRAM STAIN:   Final    No white blood cells seen No epithelial cells seen No organisms seen   ISOLATE 1: methicillin resistant Staphylococcus aureus (A)  Final    Comment: Light growth of Methicillin resistant Staphylococcus aureus (MRSA) Negative for inducible clindamycin resistance.      Susceptibility   Methicillin resistant staphylococcus aureus - AEROBIC CULT, GRAM STAIN POSITIVE 1    VANCOMYCIN <=0.5 Sensitive     CIPROFLOXACIN >=8 Resistant     CLINDAMYCIN <=0.25 Sensitive     LEVOFLOXACIN 4 Resistant     ERYTHROMYCIN >=8 Resistant     GENTAMICIN <=0.5 Sensitive      OXACILLIN* NR Resistant      * Oxacillin-resistant staphylococci are resistant toall currently available beta-lactam antimicrobialagents including penicillins, beta lactam/beta-lactamase inhibitor combinations, and cephems withstaphylococcal indications, including Cefazolin.    TETRACYCLINE <=1 Sensitive     TRIMETH/SULFA* <=10 Sensitive      * Oxacillin-resistant staphylococci are resistant toall currently available beta-lactam antimicrobialagents including penicillins, beta lactam/beta-lactamase inhibitor combinations, and cephems withstaphylococcal indications, including Cefazolin.Legend:S = Susceptible  I = IntermediateR = Resistant  NS = Not susceptible* = Not tested  NR = Not reported**NN = See antimicrobic comments  MRSA PCR Screening     Status: None   Collection Time: 01/02/18 11:10 AM  Result Value Ref Range Status   MRSA by PCR NEGATIVE NEGATIVE Final    Comment:        The GeneXpert MRSA Assay (FDA approved for NASAL specimens only), is one component of a comprehensive MRSA colonization surveillance program. It is not intended to diagnose MRSA infection nor to guide or monitor treatment for MRSA infections. Performed at [facility], [place] [address] [place], [place] [address]   Body fluid culture     Status: None (Preliminary result)   Collection Time: 01/02/18  1:37 PM  Result Value Ref Range Status   Specimen Description SYNOVIAL  Final   Special Requests NONE  Final   Gram Stain   Final    FEW WBC PRESENT, PREDOMINANTLY PMN NO ORGANISMS SEEN    Culture   Final    NO GROWTH 3 DAYS Performed at [facility] [address] [place], [place] [address]    Report Status PENDING  Incomplete      Radiology Studies: No results found.   Scheduled Meds: . allopurinol  300 mg Oral Daily  . apixaban  5 mg Oral BID  . atorvastatin  20 mg Oral q1800  . carbidopa-levodopa  2 tablet Oral QID  . colchicine  0.6 mg Oral BID  . docusate sodium  100 mg Oral  BID  . feeding supplement (ENSURE ENLIVE)  237 mL Oral BID BM  . hydrocortisone  20 mg Oral Daily   And  . hydrocortisone  10 mg Oral QHS  . lactobacillus acidophilus  2 tablet Oral TID  . levothyroxine  112 mcg Oral QAC breakfast  . midodrine  5 mg Oral BID WC  . multivitamin with minerals  1 tablet Oral Daily  . sertraline  100 mg Oral Daily  . topiramate  50 mg Oral BID   Continuous Infusions: .  ceFAZolin (ANCEF) IV 1 g (01/05/18 1401)       Time spent: 25 minutes, more than 50% at bedside in counseling/discussion with the patient and answering questions    Marzetta Board, MD, PhD [facility] Pager [phone]  If 7PM-7AM, please contact night-coverage [email or web address] Password [facility] 01/05/2018, 2:23 PM

## 2018-01-06 DIAGNOSIS — E039 Hypothyroidism, unspecified: Secondary | ICD-10-CM

## 2018-01-06 DIAGNOSIS — E2740 Unspecified adrenocortical insufficiency: Secondary | ICD-10-CM

## 2018-01-06 DIAGNOSIS — I5032 Chronic diastolic (congestive) heart failure: Secondary | ICD-10-CM

## 2018-01-06 DIAGNOSIS — L03116 Cellulitis of left lower limb: Secondary | ICD-10-CM

## 2018-01-06 DIAGNOSIS — I10 Essential (primary) hypertension: Secondary | ICD-10-CM

## 2018-01-06 DIAGNOSIS — I2510 Atherosclerotic heart disease of native coronary artery without angina pectoris: Secondary | ICD-10-CM

## 2018-01-06 DIAGNOSIS — M1A9XX1 Chronic gout, unspecified, with tophus (tophi): Secondary | ICD-10-CM

## 2018-01-06 LAB — BODY FLUID CULTURE: Culture: NO GROWTH

## 2018-01-06 MED ORDER — CEPHALEXIN 500 MG PO CAPS: 500.0000 mg | ORAL_CAPSULE | Freq: Four times a day (QID) | ORAL | 0 refills | Status: AC

## 2018-01-06 NOTE — Progress Notes (Signed)
Discharge paper work reviewed patient. No questions verbalized. Patient is ready for discharge.

## 2018-01-06 NOTE — Discharge Summary (Signed)
Physician Discharge Summary  Dennis [name]. HMC:[id number] DOB: August 03, 1945 DOA: 01/01/2018  PCP: [name], FNP  Admit date: 01/01/2018 Discharge date: 01/06/2018  Admitted From: home Disposition:  home  Recommendations for Outpatient Follow-up:  1. Follow up with PCP in 1-2 weeks  Home Health: none Equipment/Devices: none  Discharge Condition: stable CODE STATUS: Full code Diet recommendation: heart healthy  HPI: Per Dr. Jonelle Sidle, [place] Dennis [name]. is a 72 y.o. male with medical history significant of gouty arthritis with multiple episodes of gout followed at [facility].  Also coronary artery disease, atrial fibrillation, Addison's disease as well as previous cellulitis who presented to the ER with progressive swelling tenderness and redness of his right foot around the ankle.  Patient is unable to put weight on his leg.  He is also having pain at 9 out of 10.  No nausea vomiting or diarrhea.  Patient on chronic steroids.  He has had issues with his bladder including prostate cancer in the past.  He is also had multiple surgeries in his hand for gout in the past. ED Course: Patient was evaluated and impression is gout versus cellulitis.  He was initiated on IV vancomycin.  His temperature was 98.9 with normal blood pressure and heart rate.  White count 9.5 with sodium of 134, creatinine is 1.25 and glucose 114.    Hospital Course: Acute gouty arthritis of the left foot with possibly overlying cellulitis -Patient with recurrent gout flares on/off for years. He describes this as feeling like gout, but notes he's had concurrent gout and cellulitis before as well.  Orthopedic surgery was consulted and followed patient while hospitalized.  He underwent joint aspiration which showed monosodium urate crystals, consistent with gout flare.  Gram stain was negative.  He is status post corticosteroid injection.  He is ankle swelling has improved significantly, his  cellulitis has resolved, he is able to ambulate without any pain, able to work with PT without issues.  He was maintained on IV Ancef for 6 days while hospitalized, and will be discharged with p.o. Keflex for 2 additional days to complete an 8-day course. Coronary artery disease -Continue home regimen, no chest pain Addison's disease -continue hydrocortisone Parkinsonism -Continue carbidopa/levodopa, his neurologist is at [facility] History CVA -Continue eliquis  Hypothyroidism -Continue synthroid  Depression -Continue zoloft  Diabetes -Blood sugar stable  Chronic kidney disease stage III -States that he is trying to get an outpatient referral for nephrology. Discussed with him that there is no current urgent inpatient indication for consultation and he will need to follow up with PCP for outpatient referral. Creatinine has normalized and has remained normal.   Discharge Diagnoses:  Principal Problem:   Acute gouty arthritis Active Problems:   CAD (coronary artery disease)   Chronic diastolic CHF (congestive heart failure) (HCC)   Acquired hypothyroidism   Chronic tophaceous gout   Essential hypertension   Adrenal insufficiency ([place])   History of cellulitis   History of prostate cancer   Cellulitis of left foot   Gout attack     Discharge Instructions   Allergies as of 01/06/2018      Reactions   Bee Venom Anaphylaxis   Shrimp [shellfish Allergy] Anaphylaxis, Other (See Comments)   "just shrimp"   Stadol [butorphanol] Anaphylaxis, Other (See Comments)   respiratory  Distress, couldn't breathe, cardiac arrest   Wasp Venom Anaphylaxis      Medication List    STOP taking these medications  doxycycline 100 MG capsule Commonly known as:  VIBRAMYCIN     TAKE these medications   allopurinol 300 MG tablet Commonly known as:  ZYLOPRIM Take 1 tablet (300 mg total) by mouth daily.   atorvastatin 20 MG tablet Commonly known as:  LIPITOR TAKE 1  TABLET(20 MG) BY MOUTH DAILY   carbidopa-levodopa 25-250 MG tablet Commonly known as:  SINEMET IR Take 2 tablets by mouth 4 (four) times daily.   cephALEXin 500 MG capsule Commonly known as:  KEFLEX Take 1 capsule (500 mg total) by mouth 4 (four) times daily for 2 days.   colchicine 0.6 MG tablet Take 1 tablet (0.6 mg total) by mouth 2 (two) times daily.   ELIQUIS 5 MG Tabs tablet Generic drug:  apixaban Take 5 mg by mouth 2 (two) times daily.   EPINEPHrine 0.3 mg/0.3 mL Soaj injection Commonly known as:  EPI-PEN Inject 0.3 mLs (0.3 mg total) into the muscle as needed (allergic reaction).   hydrocortisone 10 MG tablet Commonly known as:  CORTEF 2 tablets in the morning and 1 tablet at 5 PM. Please start taking on 06/18/17   hydrocortisone 10 MG tablet Commonly known as:  CORTEF TAKE 2 TABLETS BY MOUTH IN THE MORNING AND 1 TABLET AT 5:00 PM   levothyroxine 112 MCG tablet Commonly known as:  SYNTHROID, LEVOTHROID TAKE 2 TABLETS(224 MCG) BY MOUTH DAILY BEFORE BREAKFAST   midodrine 5 MG tablet Commonly known as:  PROAMATINE TAKE 1 TABLET(5 MG) BY MOUTH TWICE DAILY WITH A MEAL   multivitamin with minerals Tabs tablet Take 1 tablet by mouth daily.   sertraline 100 MG tablet Commonly known as:  ZOLOFT Take 100 mg by mouth daily.   testosterone cypionate 200 MG/ML injection Commonly known as:  DEPO-TESTOSTERONE Inject 0.75 mLs (150 mg total) into the muscle every 14 (fourteen) days.   topiramate 50 MG tablet Commonly known as:  TOPAMAX Take 1 tablet (50 mg total) by mouth 2 (two) times daily.      Follow-up Information    [name], FNP. Schedule an appointment as soon as possible for a visit in 1 week(s).   Specialties:  Nurse Practitioner, Family Medicine Contact information: [place] [address] [phone]        Burnell Blanks, MD .   Specialty:  Cardiology Contact information: [place].  [address] [place] [address] [phone]           Consultations:  Orthopedic surgery  Procedures/Studies:   left ankle aspiration and injection  Dg Foot Complete Left  Result Date: 01/01/2018 CLINICAL DATA:  Flare of gout. EXAM: LEFT FOOT - COMPLETE 3+ VIEW COMPARISON:  None. FINDINGS: Soft tissue swelling at first MTP joint is associated with small erosions involving the proximal phalanx and distal metatarsal, with increased density of the soft tissue suggesting tophi. Joint space is preserved. No periarticular osteopenia. No fracture or dislocation. Mild soft tissue swelling ankle. IMPRESSION: Findings consistent with gout involving the first MTP joint. Electronically Signed   By: Staci Righter M.D.   On: 01/01/2018 17:38     Subjective: - no chest pain, shortness of breath, no abdominal pain, nausea or vomiting.   Discharge Exam: Vitals:   01/05/18 2103 01/06/18 0436  BP: (!) 146/94 132/88  Pulse: 61 (!) 56  Resp: 17 18  Temp: 97.8 F (36.6 C) (!) 97.4 F (36.3 C)  SpO2: 100% 100%    General: Pt is alert, awake, not in  acute distress Cardiovascular: RRR, S1/S2 +, no rubs, no gallops Respiratory: CTA bilaterally, no wheezing, no rhonchi Abdominal: Soft, NT, ND, bowel sounds + Extremities: no edema, no cyanosis    The results of significant diagnostics from this hospitalization (including imaging, microbiology, ancillary and laboratory) are listed below for reference.     Microbiology: Recent Results (from the past 240 hour(s))  WOUND CULTURE     Status: Abnormal   Collection Time: 12/27/17  4:57 PM  Result Value Ref Range Status   MICRO NUMBER: [id number]  Final   SPECIMEN QUALITY: ADEQUATE  Final   SOURCE: NOT GIVEN  Final   STATUS: FINAL  Final   GRAM STAIN:   Final    No white blood cells seen No epithelial cells seen No organisms seen   ISOLATE 1: methicillin resistant Staphylococcus aureus (A)  Final    Comment: Light growth of Methicillin resistant  Staphylococcus aureus (MRSA) Negative for inducible clindamycin resistance.      Susceptibility   Methicillin resistant staphylococcus aureus - AEROBIC CULT, GRAM STAIN POSITIVE 1    VANCOMYCIN <=0.5 Sensitive     CIPROFLOXACIN >=8 Resistant     CLINDAMYCIN <=0.25 Sensitive     LEVOFLOXACIN 4 Resistant     ERYTHROMYCIN >=8 Resistant     GENTAMICIN <=0.5 Sensitive     OXACILLIN* NR Resistant      * Oxacillin-resistant staphylococci are resistant toall currently available beta-lactam antimicrobialagents including penicillins, beta lactam/beta-lactamase inhibitor combinations, and cephems withstaphylococcal indications, including Cefazolin.    TETRACYCLINE <=1 Sensitive     TRIMETH/SULFA* <=10 Sensitive      * Oxacillin-resistant staphylococci are resistant toall currently available beta-lactam antimicrobialagents including penicillins, beta lactam/beta-lactamase inhibitor combinations, and cephems withstaphylococcal indications, including Cefazolin.Legend:S = Susceptible  I = IntermediateR = Resistant  NS = Not susceptible* = Not tested  NR = Not reported**NN = See antimicrobic comments  MRSA PCR Screening     Status: None   Collection Time: 01/02/18 11:10 AM  Result Value Ref Range Status   MRSA by PCR NEGATIVE NEGATIVE Final    Comment:        The GeneXpert MRSA Assay (FDA approved for NASAL specimens only), is one component of a comprehensive MRSA colonization surveillance program. It is not intended to diagnose MRSA infection nor to guide or monitor treatment for MRSA infections. Performed at [facility], [place] [address], [place], [place] [address]   Body fluid culture     Status: None   Collection Time: 01/02/18  1:37 PM  Result Value Ref Range Status   Specimen Description SYNOVIAL  Final   Special Requests NONE  Final   Gram Stain   Final    FEW WBC PRESENT, PREDOMINANTLY PMN NO ORGANISMS SEEN    Culture   Final    NO GROWTH 3 DAYS Performed at [facility], [place] [address], [place], [place] [address]    Report Status 01/06/2018 FINAL  Final     Labs: BNP (last 3 results) Recent Labs    05/27/17 1618 06/06/17 1017  BNP 47.0 02.5   Basic Metabolic Panel: Recent Labs  Lab 01/01/18 1709 01/02/18 0540 01/03/18 0444 01/04/18 0605 01/05/18 0726  NA 134* 134* 136 138 138  K 3.6 4.4 4.9 4.1 4.2  CL 99* 105 104 107 107  CO2 24 23 26 26 26   GLUCOSE 114* 165* 129* 102* 92  BUN 18 22 23 20 16   CREATININE 1.25* 1.15 1.05 0.91 0.97  CALCIUM 9.9 9.2 8.8* 9.0 9.2  MG  --   --  2.1  --   --    Liver Function Tests: Recent Labs  Lab 01/01/18 1709 01/02/18 0540  AST 19 17  ALT 6* 6  ALKPHOS 63 56  BILITOT 2.0* 1.8*  PROT 7.6 6.8  ALBUMIN 3.9 3.4*   No results for input(s): LIPASE, AMYLASE in the last 168 hours. No results for input(s): AMMONIA in the last 168 hours. CBC: Recent Labs  Lab 01/01/18 1709 01/02/18 0540 01/03/18 0444 01/04/18 0605 01/05/18 0726  WBC 9.5 9.1 8.8 6.6 6.5  NEUTROABS 7.7  --   --   --   --   HGB 17.0 15.9 14.9 14.7 16.2  HCT 54.4* 51.4 47.3 47.3 51.5  MCV 87.9 88.3 86.3 88.1 88.0  PLT 254 227 247 241 264   Cardiac Enzymes: No results for input(s): CKTOTAL, CKMB, CKMBINDEX, TROPONINI in the last 168 hours. BNP: Invalid input(s): POCBNP CBG: No results for input(s): GLUCAP in the last 168 hours. D-Dimer No results for input(s): DDIMER in the last 72 hours. Hgb A1c No results for input(s): HGBA1C in the last 72 hours. Lipid Profile No results for input(s): CHOL, HDL, LDLCALC, TRIG, CHOLHDL, LDLDIRECT in the last 72 hours. Thyroid function studies No results for input(s): TSH, T4TOTAL, T3FREE, THYROIDAB in the last 72 hours.  Invalid input(s): FREET3 Anemia work up No results for input(s): VITAMINB12, FOLATE, FERRITIN, TIBC, IRON, RETICCTPCT in the last 72 hours. Urinalysis    Component Value Date/Time   COLORURINE YELLOW (A) 05/27/2017 1643   APPEARANCEUR CLEAR (A) 05/27/2017 1643    APPEARANCEUR Clear 08/11/2012 2032   LABSPEC 1.021 05/27/2017 1643   LABSPEC 1.017 08/11/2012 2032   PHURINE 5.0 05/27/2017 1643   GLUCOSEU NEGATIVE 05/27/2017 1643   GLUCOSEU Negative 08/11/2012 2032   HGBUR NEGATIVE 05/27/2017 1643   BILIRUBINUR negative 12/20/2017 1104   BILIRUBINUR Negative 08/11/2012 2032   KETONESUR 5 (A) 05/27/2017 1643   PROTEINUR Negative 12/20/2017 1104   PROTEINUR NEGATIVE 05/27/2017 1643   UROBILINOGEN 0.2 12/20/2017 1104   UROBILINOGEN 2.0 (H) 08/04/2014 2344   NITRITE negative 12/20/2017 1104   NITRITE NEGATIVE 05/27/2017 1643   LEUKOCYTESUR Moderate (2+) (A) 12/20/2017 1104   LEUKOCYTESUR Trace 08/11/2012 2032   Sepsis Labs Invalid input(s): PROCALCITONIN,  WBC,  LACTICIDVEN   Time coordinating discharge: 40 minutes  SIGNED:  Marzetta Board, MD  [facility] 01/06/2018, 12:41 PM Pager [phone]  If 7PM-7AM, please contact night-coverage [email or web address] Password [email or web address]

## 2018-01-08 ENCOUNTER — Telehealth: Payer: Self-pay

## 2018-01-08 NOTE — Telephone Encounter (Signed)
Noted. Will discuss nephrology referral at upcoming appointment.

## 2018-01-08 NOTE — Telephone Encounter (Signed)
Transition Care Management Follow-up Telephone Call   Date discharged? 01/06/18   How have you been since you were released from the hospital? I am doing much better.   Do you understand why you were in the hospital? Yes   Do you understand the discharge instructions? Yes   Where were you discharged to? Home   Items Reviewed:  Medications reviewed: Yes  Allergies reviewed: Yes  Dietary changes reviewed: No changes in diet  Referrals reviewed: Needs a referral to a Nephrologist and has f/u with cardiologist, Dr. Glennie Hawk   Functional Questionnaire:   Activities of Daily Living (ADLs):   He states they are independent in the following: dressing, bathing, grooming, feeding self, ambulation,  States they require assistance with the following: Patient does not require any assistance with his ADL'S   Any transportation issues/concerns?: No   Any patient concerns? Yes, he will discuss with Debbie at upcoming appointment, but would like a referral to a nephrologist.     Confirmed importance and date/time of follow-up visits scheduled Yes  Provider Appointment booked with Glenda Chroman, Np July 5th at 3pm.     Confirmed with patient if condition begins to worsen call PCP or go to the ER.  Patient was given the office number and encouraged to call back with question or concerns.  : yes

## 2018-01-12 ENCOUNTER — Ambulatory Visit (INDEPENDENT_AMBULATORY_CARE_PROVIDER_SITE_OTHER): Payer: Medicare Other | Admitting: Family Medicine

## 2018-01-12 ENCOUNTER — Encounter: Payer: Self-pay | Admitting: Family Medicine

## 2018-01-12 VITALS — BP 126/80 | HR 79 | Temp 98.4°F | Wt 321.0 lb

## 2018-01-12 DIAGNOSIS — I10 Essential (primary) hypertension: Secondary | ICD-10-CM | POA: Diagnosis not present

## 2018-01-12 DIAGNOSIS — L03116 Cellulitis of left lower limb: Secondary | ICD-10-CM

## 2018-01-12 DIAGNOSIS — R630 Anorexia: Secondary | ICD-10-CM

## 2018-01-12 DIAGNOSIS — Z862 Personal history of diseases of the blood and blood-forming organs and certain disorders involving the immune mechanism: Secondary | ICD-10-CM

## 2018-01-12 DIAGNOSIS — Z09 Encounter for follow-up examination after completed treatment for conditions other than malignant neoplasm: Secondary | ICD-10-CM

## 2018-01-12 NOTE — Patient Instructions (Addendum)
Good to see you today  Continue to take Colace for your bowels,  Can add miralax if needed  Stop by the lab to give a sample
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
## 2018-01-13 LAB — MICROALBUMIN / CREATININE URINE RATIO
Creatinine, Urine: 196 mg/dL (ref 20–320)
Microalb Creat Ratio: 5 mcg/mg creat (ref <30)
Microalb, Ur: 0.9 mg/dL

## 2018-01-18 ENCOUNTER — Other Ambulatory Visit: Payer: Self-pay | Admitting: Family Medicine

## 2018-01-18 DIAGNOSIS — E790 Hyperuricemia without signs of inflammatory arthritis and tophaceous disease: Secondary | ICD-10-CM

## 2018-01-18 DIAGNOSIS — M1A9XX1 Chronic gout, unspecified, with tophus (tophi): Secondary | ICD-10-CM

## 2018-01-18 DIAGNOSIS — M109 Gout, unspecified: Secondary | ICD-10-CM

## 2018-01-23 ENCOUNTER — Other Ambulatory Visit: Payer: Self-pay | Admitting: Family Medicine

## 2018-01-25 DIAGNOSIS — Z8546 Personal history of malignant neoplasm of prostate: Secondary | ICD-10-CM | POA: Diagnosis not present

## 2018-01-25 DIAGNOSIS — N3021 Other chronic cystitis with hematuria: Secondary | ICD-10-CM | POA: Diagnosis not present

## 2018-01-25 DIAGNOSIS — R350 Frequency of micturition: Secondary | ICD-10-CM | POA: Diagnosis not present

## 2018-01-25 DIAGNOSIS — H35372 Puckering of macula, left eye: Secondary | ICD-10-CM | POA: Diagnosis not present

## 2018-01-25 DIAGNOSIS — H43811 Vitreous degeneration, right eye: Secondary | ICD-10-CM | POA: Diagnosis not present

## 2018-01-25 DIAGNOSIS — R3915 Urgency of urination: Secondary | ICD-10-CM | POA: Diagnosis not present

## 2018-01-26 ENCOUNTER — Telehealth: Payer: Self-pay | Admitting: *Deleted

## 2018-01-26 NOTE — Telephone Encounter (Signed)
   Primary Cardiologist: Lauree Chandler, MD  Chart reviewed as part of pre-operative protocol coverage. Given past medical history and time since last visit, based on ACC/AHA guidelines, Dennis Hirt Sr. would be at acceptable risk for the planned procedure without further cardiovascular testing.  Low risk surgery and low risk for pt.   I will route this recommendation to the requesting party via Epic fax function and remove from pre-op pool.  Please call with questions.  Cecilie Kicks, NP 01/26/2018, 3:05 PM

## 2018-01-26 NOTE — Telephone Encounter (Signed)
   [facility] Pre-operative Risk Assessment    Request for surgical clearance:  1. What type of surgery is being performed? Cataract Extraction by PE.IOL-Left    2. When is this surgery scheduled? 02/15/18  3. What type of clearance is required (medical clearance vs. Pharmacy clearance to hold med vs. Both)? Medical;  4. Are there any medications that need to be held prior to surgery and how long?  Do not need to hold anticoag.  5. Practice name and name of physician performing surgery? [facility], Dr. Marca Ancona. [place]        6. What is your office phone number [phone]   7.   What is your office fax number [phone]  8.   Anesthesia type (None, local, MAC, general) ? Not specified  Dennis [name] 01/26/2018, 12:10 PM  _________________________________________________________________   (provider comments below)

## 2018-02-01 ENCOUNTER — Ambulatory Visit (INDEPENDENT_AMBULATORY_CARE_PROVIDER_SITE_OTHER): Payer: Medicare Other | Admitting: *Deleted

## 2018-02-01 DIAGNOSIS — I639 Cerebral infarction, unspecified: Secondary | ICD-10-CM | POA: Diagnosis not present

## 2018-02-01 NOTE — Progress Notes (Signed)
Carelink Summary Report / Loop Recorder 

## 2018-02-08 DIAGNOSIS — C61 Malignant neoplasm of prostate: Secondary | ICD-10-CM | POA: Diagnosis not present

## 2018-02-08 DIAGNOSIS — N3021 Other chronic cystitis with hematuria: Secondary | ICD-10-CM | POA: Diagnosis not present

## 2018-02-08 DIAGNOSIS — N281 Cyst of kidney, acquired: Secondary | ICD-10-CM | POA: Diagnosis not present

## 2018-02-13 LAB — CUP PACEART REMOTE DEVICE CHECK
Date Time Interrogation Session: 20190622144111
Implantable Pulse Generator Implant Date: 20170719

## 2018-02-15 DIAGNOSIS — Z9842 Cataract extraction status, left eye: Secondary | ICD-10-CM | POA: Diagnosis not present

## 2018-02-15 DIAGNOSIS — H2512 Age-related nuclear cataract, left eye: Secondary | ICD-10-CM | POA: Diagnosis not present

## 2018-02-15 DIAGNOSIS — H25812 Combined forms of age-related cataract, left eye: Secondary | ICD-10-CM | POA: Diagnosis not present

## 2018-02-15 DIAGNOSIS — Z961 Presence of intraocular lens: Secondary | ICD-10-CM | POA: Diagnosis not present

## 2018-02-20 ENCOUNTER — Emergency Department (HOSPITAL_COMMUNITY)
Admission: EM | Admit: 2018-02-20 | Discharge: 2018-02-20 | Disposition: A | Discharge status: Home / Self Care | Payer: Medicare Other | Attending: Emergency Medicine | Admitting: Emergency Medicine

## 2018-02-20 ENCOUNTER — Encounter (HOSPITAL_COMMUNITY): Payer: Self-pay | Admitting: *Deleted

## 2018-02-20 ENCOUNTER — Ambulatory Visit: Payer: Self-pay | Admitting: Family Medicine

## 2018-02-20 DIAGNOSIS — I110 Hypertensive heart disease with heart failure: Secondary | ICD-10-CM | POA: Insufficient documentation

## 2018-02-20 DIAGNOSIS — Z8547 Personal history of malignant neoplasm of testis: Secondary | ICD-10-CM | POA: Diagnosis not present

## 2018-02-20 DIAGNOSIS — Z79899 Other long term (current) drug therapy: Secondary | ICD-10-CM | POA: Diagnosis not present

## 2018-02-20 DIAGNOSIS — I5032 Chronic diastolic (congestive) heart failure: Secondary | ICD-10-CM | POA: Diagnosis not present

## 2018-02-20 DIAGNOSIS — Z7901 Long term (current) use of anticoagulants: Secondary | ICD-10-CM | POA: Insufficient documentation

## 2018-02-20 DIAGNOSIS — Z96652 Presence of left artificial knee joint: Secondary | ICD-10-CM | POA: Diagnosis not present

## 2018-02-20 DIAGNOSIS — Z8673 Personal history of transient ischemic attack (TIA), and cerebral infarction without residual deficits: Secondary | ICD-10-CM | POA: Diagnosis not present

## 2018-02-20 DIAGNOSIS — E039 Hypothyroidism, unspecified: Secondary | ICD-10-CM | POA: Diagnosis not present

## 2018-02-20 DIAGNOSIS — K529 Noninfective gastroenteritis and colitis, unspecified: Secondary | ICD-10-CM | POA: Insufficient documentation

## 2018-02-20 DIAGNOSIS — Z8546 Personal history of malignant neoplasm of prostate: Secondary | ICD-10-CM | POA: Insufficient documentation

## 2018-02-20 DIAGNOSIS — Z95810 Presence of automatic (implantable) cardiac defibrillator: Secondary | ICD-10-CM | POA: Insufficient documentation

## 2018-02-20 DIAGNOSIS — I252 Old myocardial infarction: Secondary | ICD-10-CM | POA: Insufficient documentation

## 2018-02-20 DIAGNOSIS — I2510 Atherosclerotic heart disease of native coronary artery without angina pectoris: Secondary | ICD-10-CM | POA: Diagnosis not present

## 2018-02-20 DIAGNOSIS — R197 Diarrhea, unspecified: Secondary | ICD-10-CM | POA: Diagnosis present

## 2018-02-20 DIAGNOSIS — G20 Parkinson's disease: Secondary | ICD-10-CM | POA: Insufficient documentation

## 2018-02-20 LAB — CBC
HCT: 50 % (ref 39.0–52.0)
Hemoglobin: 15.9 g/dL (ref 13.0–17.0)
MCH: 27.1 pg (ref 26.0–34.0)
MCHC: 31.8 g/dL (ref 30.0–36.0)
MCV: 85.3 fL (ref 78.0–100.0)
Platelets: 201 10*3/uL (ref 150–400)
RBC: 5.86 MIL/uL — ABNORMAL HIGH (ref 4.22–5.81)
RDW: 14.7 % (ref 11.5–15.5)
WBC: 6.2 10*3/uL (ref 4.0–10.5)

## 2018-02-20 LAB — URINALYSIS, ROUTINE W REFLEX MICROSCOPIC
Bacteria, UA: NONE SEEN
Bilirubin Urine: NEGATIVE
Glucose, UA: NEGATIVE mg/dL
Hgb urine dipstick: NEGATIVE
Ketones, ur: NEGATIVE mg/dL
Nitrite: NEGATIVE
Protein, ur: NEGATIVE mg/dL
Specific Gravity, Urine: 1.02 (ref 1.005–1.030)
pH: 5 (ref 5.0–8.0)

## 2018-02-20 LAB — LIPASE, BLOOD: Lipase: 31 U/L (ref 11–51)

## 2018-02-20 LAB — COMPREHENSIVE METABOLIC PANEL
ALT: 26 U/L (ref 0–44)
AST: 22 U/L (ref 15–41)
Albumin: 3.4 g/dL — ABNORMAL LOW (ref 3.5–5.0)
Alkaline Phosphatase: 61 U/L (ref 38–126)
Anion gap: 9 (ref 5–15)
BUN: 14 mg/dL (ref 8–23)
CO2: 23 mmol/L (ref 22–32)
Calcium: 9.1 mg/dL (ref 8.9–10.3)
Chloride: 103 mmol/L (ref 98–111)
Creatinine, Ser: 1.23 mg/dL (ref 0.61–1.24)
GFR calc Af Amer: >60 mL/min (ref >60)
GFR calc non Af Amer: 57 mL/min — ABNORMAL LOW (ref >60)
Glucose, Bld: 96 mg/dL (ref 70–99)
Potassium: 3.5 mmol/L (ref 3.5–5.1)
Sodium: 135 mmol/L (ref 135–145)
Total Bilirubin: 1.6 mg/dL — ABNORMAL HIGH (ref 0.3–1.2)
Total Protein: 6.4 g/dL — ABNORMAL LOW (ref 6.5–8.1)

## 2018-02-20 LAB — C DIFFICILE QUICK SCREEN W PCR REFLEX
C Diff antigen: NEGATIVE
C Diff interpretation: NOT DETECTED
C Diff toxin: NEGATIVE

## 2018-02-20 MED ORDER — SODIUM CHLORIDE 0.9 % IV BOLUS
500.0000 mL | Freq: Once | INTRAVENOUS | Status: AC
  Administered 2018-02-20: 500 mL via INTRAVENOUS

## 2018-02-20 MED ORDER — METRONIDAZOLE 500 MG PO TABS: 500.0000 mg | ORAL_TABLET | Freq: Three times a day (TID) | ORAL | 0 refills | Status: AC

## 2018-02-20 MED ORDER — CIPROFLOXACIN HCL 500 MG PO TABS: 500.0000 mg | ORAL_TABLET | Freq: Two times a day (BID) | ORAL | 0 refills | Status: AC

## 2018-02-20 NOTE — Telephone Encounter (Signed)
Unable to reach pt or his wife. Lucynda left this additional triage note;  Here are the triage notes for pt of Lucie Leather.  Thanks for your help.  He is undecided whether he is going to the ED or not.  He is going to call us back with his decision.  See the triage notes Lucynda RN called back and said pt has so much going on and thinks if went to ED he could get admitted.

## 2018-02-20 NOTE — ED Triage Notes (Signed)
Pt in c/o diarrhea for two weeks, sent in for evaluation of c-diff

## 2018-02-20 NOTE — ED Notes (Signed)
Pt assisted back to bed from bedsdie commode. Moves slowly. 300 + cc of liquid stool

## 2018-02-20 NOTE — Telephone Encounter (Signed)
He called in c/o having watery, foul smelling diarrhea for over a month.   "I can't keep anything in me".    "everything goes straight through me".    I'm weak.  He also mentioned he has Parkinson's too.   He is drinking a lot of water but is still concerned about being dehydrated. I was in the hospital not long ago and was on IV antibiotics. ( In hospital 01/01/18)   He mentioned he had a CT scan done at [facility] last week with contrast.   He doesn't know the results of that yet but didn't know if it might show something or not pertaining to the diarrhea.   See triage notes.  I let him know he needed to go to the ED.   He said he would go to [facility] in [place] if he decided to go.   "I need to work some things out".   "My son is here and can take me but I've got so much going on".    I had eye surgery and I have a follow up appt with my eye doctor on Thursday that I really can't miss.    "Plus it's just a revolving door when I have to go to the hospital".    "I really don't want to be possibly admitted right now".   I will call y'all back and let you know my decision.    I spoke with Rena the clinical flow coordinator to see if they prefer he come there or go to the ED.    They prefer he go to the ED plus there is no one available to see him today in the office.   Concerned about dehydration plus hospital can test stool for C-diff.  I let pt know he needed to go to the ED.   See above note.  I routed this triage note to [facility] office.    Reason for Disposition . Patient sounds very sick or weak to the triager  Answer Assessment - Initial Assessment Questions 1. DIARRHEA SEVERITY: "How bad is the diarrhea?" "How many extra stools have you had in the past 24 hours than normal?"    - NO DIARRHEA (SCALE 0)   - MILD (SCALE 1-3): Few loose or mushy BMs; increase of 1-3 stools over normal daily number of stools; mild increase in ostomy output.   -  MODERATE (SCALE 4-7):  Increase of 4-6 stools daily over normal; moderate increase in ostomy output. * SEVERE (SCALE 8-10; OR 'WORST POSSIBLE'): Increase of 7 or more stools daily over normal; moderate increase in ostomy output; incontinence.     I think I have C-diff.    I've been to the bathroom twice this morning.   No stools it's all "spew".    I passed large stool at first that "felt like it was ripping me apart".   Now nothing is staying in system.   I'm losing weight.   I'm not retaining anything.       My wife's relative had this a while back.   This seems like what she had.     It's pure watery liquid and smells real bad. 2. ONSET: "When did the diarrhea begin?"      Over a month ago.   3. BM CONSISTENCY: "How loose or watery is the diarrhea?"      Watery liquid stool.    I was in the hospital.   I was on stool softeners in  the hospital.   I had large painful hard stools to now watery liquid.   I'm taking probiotics.  I was on IV antibiotics in the hospital.    I have RA and uric acid build up.   4. VOMITING: "Are you also vomiting?" If so, ask: "How many times in the past 24 hours?"      No   I'm having shooting pains in my intestinal tract in the front.   I have bouts of feeling cold.   No nausea.    I have Parkinson's.   I'm constantly drinking water.   No sodas, tea or coffee.  I'm so weak. 5. ABDOMINAL PAIN: "Are you having any abdominal pain?" If yes: "What does it feel like?" (e.g., crampy, dull, intermittent, constant)      See above 6. ABDOMINAL PAIN SEVERITY: If present, ask: "How bad is the pain?"  (e.g., Scale 1-10; mild, moderate, or severe)   - MILD (1-3): doesn't interfere with normal activities, abdomen soft and not tender to touch    - MODERATE (4-7): interferes with normal activities or awakens from sleep, tender to touch    - SEVERE (8-10): excruciating pain, doubled over, unable to do any normal activities       I have a high pain tolerance.   "I feel it".  I know it's there. 7. ORAL INTAKE: If  vomiting, "Have you been able to drink liquids?" "How much fluids have you had in the past 24 hours?"     I'm drinking water constantly 8. HYDRATION: "Any signs of dehydration?" (e.g., dry mouth [not just dry lips], too weak to stand, dizziness, new weight loss) "When did you last urinate?"     I'm feeling drained and weak.   Denies dizziness.    Nothing is staying in me. 9. EXPOSURE: "Have you traveled to a foreign country recently?" "Have you been exposed to anyone with diarrhea?" "Could you have eaten any food that was spoiled?"     Was in the hospital on IV antibiotics. 10. ANTIBIOTIC USE: "Are you taking antibiotics now or have you taken antibiotics in the past 2 months?"       Yes in the hospital. 11. OTHER SYMPTOMS: "Do you have any other symptoms?" (e.g., fever, blood in stool)       See above. 12. PREGNANCY: "Is there any chance you are pregnant?" "When was your last menstrual period?"       N/A  Protocols used: DIARRHEA-A-AH

## 2018-02-20 NOTE — Discharge Instructions (Signed)
You were given a prescription for antibiotics. Please take the antibiotic prescription fully.   I have prescribed a new medication for you today. It is important that when you pick the prescription up you discuss the potential interactions of this medication with other medications you are taking, including over the counter medications, with the pharmacists.   This new medication has potential side effects. Be sure to contact your primary care provider or return to the emergency department if you are experiencing new symptoms that you are unable to tolerate after starting the medication. You need to receive medical evaluation immediately if you start to experience blistering of the skin, rash, swelling, or difficulty breathing as these signs could indicate a more serious medication side effect.   Please follow up with your primary care provider within 5-7 days for re-evaluation of your symptoms.   Please return to the emergency room immediately if you experience any new or worsening symptoms or any symptoms that indicate worsening infection such as fevers, increased redness/swelling/pain, warmth, or drainage from the affected area.

## 2018-02-20 NOTE — ED Notes (Signed)
Pt to bathroom via wc/ Pt has 300-400- cc liquid diarrhea . Pt assisted to room. Moves slowly.
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
## 2018-02-20 NOTE — ED Provider Notes (Signed)
16:00: Assumed care from [place] PA-C at change of shift pending urinalysis, plan for discharge home for suspected colitis with Ciprofloxacin and Flagyl.  Urinalysis grossly unremarkable.  No evidence of significant infection.  Discharge instructions per previous provider.      Leafy Kindle 02/20/18 1621    Milton Ferguson, MD 02/22/18 1218

## 2018-02-20 NOTE — ED Provider Notes (Signed)
Medical screening examination/treatment/procedure(s) were conducted as a shared visit with non-physician practitioner(s) and myself.  I personally evaluated the patient during the encounter.  Clinical Impression:   Final diagnoses:  Colitis    The patient is an obese 72 year old male history of Parkinson's disease but also with a recent history of cellulitis was admitted to the hospital and received IV antibiotics, over the last week he has developed increased amounts of diarrhea, 5 or 6 episodes a day, loose brown and smelly, often watery but nonbloody.  On my exam he has tenderness minimally in the left suprapubic area but no other significant abdominal tenderness.  He has no tachycardia, lung sounds are clear, he does not appear to be ill and is not vomiting.  He was sent for evaluation of C. difficile, sample sent, anticipate discharge based on the patient's generalized appearance unless he has any signs of sepsis on his work-up.  The patient is agreeable.   Noemi Chapel, MD 02/22/18 1010

## 2018-02-20 NOTE — Telephone Encounter (Signed)
I spoke with Dennis [name] (DPR signed) Dennis [name] the son is with pt now. Dennis [name] said pt will go to ED after seeing eye doctor today. FYI to Glenda Chroman FNP.

## 2018-02-20 NOTE — ED Notes (Signed)
Patient verbalizes understanding of discharge instructions. Opportunity for questioning and answers were provided. Armband removed by staff, pt discharged from ED in wheelchair.  

## 2018-02-20 NOTE — Telephone Encounter (Signed)
Noted. Please call and check on him and schedule follow up.

## 2018-02-22 NOTE — Telephone Encounter (Signed)
Called and spoke to patient he states that ED cleared him of C-diff. Patient states that he is feeling better and is able to hold food and water down. Patient will call and schedule a follow up appointment. Nothing further needed at this time.

## 2018-02-23 NOTE — Telephone Encounter (Signed)
Noted  
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
## 2018-02-26 ENCOUNTER — Telehealth: Payer: Self-pay | Admitting: Family Medicine

## 2018-02-26 ENCOUNTER — Ambulatory Visit (INDEPENDENT_AMBULATORY_CARE_PROVIDER_SITE_OTHER): Payer: Medicare Other | Admitting: Cardiovascular Disease

## 2018-02-26 ENCOUNTER — Encounter: Payer: Self-pay | Admitting: Cardiovascular Disease

## 2018-02-26 VITALS — BP 100/66 | HR 111 | Ht 77.0 in | Wt 318.8 lb

## 2018-02-26 DIAGNOSIS — I5032 Chronic diastolic (congestive) heart failure: Secondary | ICD-10-CM | POA: Diagnosis not present

## 2018-02-26 DIAGNOSIS — E860 Dehydration: Secondary | ICD-10-CM

## 2018-02-26 DIAGNOSIS — I6381 Other cerebral infarction due to occlusion or stenosis of small artery: Secondary | ICD-10-CM | POA: Diagnosis not present

## 2018-02-26 DIAGNOSIS — I10 Essential (primary) hypertension: Secondary | ICD-10-CM | POA: Diagnosis not present

## 2018-02-26 DIAGNOSIS — I2510 Atherosclerotic heart disease of native coronary artery without angina pectoris: Secondary | ICD-10-CM

## 2018-02-26 DIAGNOSIS — I480 Paroxysmal atrial fibrillation: Secondary | ICD-10-CM | POA: Diagnosis not present

## 2018-02-26 NOTE — Telephone Encounter (Signed)
Spoke with Dennis [name] offered him appointment 8/20 with [facility].  He had conflict with eye dr appointment.  His appointment is 8/22. Dennis [name] stated he is still having extreme diarrhea  Every time he eats it goes right through him.  His diarrhea is water.  He is almost finished with his antibiotic that hospital gave him.  He wanted to know if you have heard anything from the CT scan that he had done @ [facility]. He would like a call back to advise him what he needs to do.

## 2018-02-26 NOTE — Telephone Encounter (Signed)
If is is still having extreme diarrhea, then he does need to be seen this week. I do not see any urology imaging in the computer. May be in PCP inbox.

## 2018-02-26 NOTE — Telephone Encounter (Signed)
If doing better, then okay to push f/u back slightly.  If worse, then needs eval.  I agree with Baity.  Thanks.

## 2018-02-26 NOTE — Telephone Encounter (Signed)
See below crm Is it ok for pt to wait will debbie comes back next week  Or can I schedule with one of you    Copied from [place] [phone]. Topic: Appointment Scheduling - Scheduling Inquiry for Clinic >> Feb 26, 2018  9:36 AM Ahmed Prima L wrote: Reason for CRM: Patient was in the emergency room on 8/13 and was advised to follow up with [facility] around 8/20. She is out of the office and patient would like to come in today or tomorrow. Please advise.

## 2018-02-26 NOTE — Telephone Encounter (Signed)
I think he should be okay to wait until [name] returns, unless he seems to be getting worse, increased abdominal pain, diarrhea, blood in stool, fever, chills or nausea. If so, I'd be happy to see him.

## 2018-02-26 NOTE — Telephone Encounter (Signed)
I would suggest continue abx, back to ER if worse before then.

## 2018-02-26 NOTE — Patient Instructions (Signed)

## 2018-02-26 NOTE — Telephone Encounter (Signed)
Pt has appointment 8/22 with you.  Is this ok? He wanted to know what he needed to do until he can be seen?

## 2018-02-28 ENCOUNTER — Encounter (HOSPITAL_COMMUNITY): Payer: Self-pay | Admitting: *Deleted

## 2018-02-28 ENCOUNTER — Inpatient Hospital Stay (HOSPITAL_COMMUNITY)
Admission: EM | Admit: 2018-02-28 | Discharge: 2018-03-03 | DRG: 392 | Disposition: A | Discharge status: Home / Self Care | Payer: Medicare Other | Attending: Internal Medicine | Admitting: Internal Medicine

## 2018-02-28 ENCOUNTER — Emergency Department (HOSPITAL_COMMUNITY): Payer: Medicare Other

## 2018-02-28 DIAGNOSIS — I252 Old myocardial infarction: Secondary | ICD-10-CM

## 2018-02-28 DIAGNOSIS — K644 Residual hemorrhoidal skin tags: Secondary | ICD-10-CM

## 2018-02-28 DIAGNOSIS — G20 Parkinson's disease: Secondary | ICD-10-CM | POA: Diagnosis not present

## 2018-02-28 DIAGNOSIS — E669 Obesity, unspecified: Secondary | ICD-10-CM | POA: Diagnosis present

## 2018-02-28 DIAGNOSIS — Z888 Allergy status to other drugs, medicaments and biological substances status: Secondary | ICD-10-CM

## 2018-02-28 DIAGNOSIS — K52831 Collagenous colitis: Secondary | ICD-10-CM | POA: Diagnosis not present

## 2018-02-28 DIAGNOSIS — G20A1 Parkinson's disease without dyskinesia, without mention of fluctuations: Secondary | ICD-10-CM | POA: Diagnosis present

## 2018-02-28 DIAGNOSIS — Z6837 Body mass index (BMI) 37.0-37.9, adult: Secondary | ICD-10-CM

## 2018-02-28 DIAGNOSIS — I130 Hypertensive heart and chronic kidney disease with heart failure and stage 1 through stage 4 chronic kidney disease, or unspecified chronic kidney disease: Secondary | ICD-10-CM | POA: Diagnosis not present

## 2018-02-28 DIAGNOSIS — M5137 Other intervertebral disc degeneration, lumbosacral region: Secondary | ICD-10-CM | POA: Diagnosis present

## 2018-02-28 DIAGNOSIS — I5032 Chronic diastolic (congestive) heart failure: Secondary | ICD-10-CM | POA: Diagnosis present

## 2018-02-28 DIAGNOSIS — Z91013 Allergy to seafood: Secondary | ICD-10-CM

## 2018-02-28 DIAGNOSIS — I700 Atherosclerosis of aorta: Secondary | ICD-10-CM | POA: Diagnosis present

## 2018-02-28 DIAGNOSIS — G4733 Obstructive sleep apnea (adult) (pediatric): Secondary | ICD-10-CM

## 2018-02-28 DIAGNOSIS — Z8049 Family history of malignant neoplasm of other genital organs: Secondary | ICD-10-CM

## 2018-02-28 DIAGNOSIS — Z7901 Long term (current) use of anticoagulants: Secondary | ICD-10-CM

## 2018-02-28 DIAGNOSIS — K633 Ulcer of intestine: Secondary | ICD-10-CM | POA: Diagnosis present

## 2018-02-28 DIAGNOSIS — N179 Acute kidney failure, unspecified: Secondary | ICD-10-CM | POA: Diagnosis not present

## 2018-02-28 DIAGNOSIS — R197 Diarrhea, unspecified: Secondary | ICD-10-CM

## 2018-02-28 DIAGNOSIS — E7800 Pure hypercholesterolemia, unspecified: Secondary | ICD-10-CM | POA: Diagnosis present

## 2018-02-28 DIAGNOSIS — Z91030 Bee allergy status: Secondary | ICD-10-CM

## 2018-02-28 DIAGNOSIS — E271 Primary adrenocortical insufficiency: Secondary | ICD-10-CM | POA: Diagnosis present

## 2018-02-28 DIAGNOSIS — Z823 Family history of stroke: Secondary | ICD-10-CM

## 2018-02-28 DIAGNOSIS — Z8701 Personal history of pneumonia (recurrent): Secondary | ICD-10-CM

## 2018-02-28 DIAGNOSIS — I480 Paroxysmal atrial fibrillation: Secondary | ICD-10-CM | POA: Diagnosis present

## 2018-02-28 DIAGNOSIS — I10 Essential (primary) hypertension: Secondary | ICD-10-CM | POA: Diagnosis not present

## 2018-02-28 DIAGNOSIS — E039 Hypothyroidism, unspecified: Secondary | ICD-10-CM | POA: Diagnosis not present

## 2018-02-28 DIAGNOSIS — Q438 Other specified congenital malformations of intestine: Secondary | ICD-10-CM

## 2018-02-28 DIAGNOSIS — G4730 Sleep apnea, unspecified: Secondary | ICD-10-CM | POA: Diagnosis present

## 2018-02-28 DIAGNOSIS — R159 Full incontinence of feces: Secondary | ICD-10-CM | POA: Diagnosis present

## 2018-02-28 DIAGNOSIS — R079 Chest pain, unspecified: Secondary | ICD-10-CM | POA: Diagnosis not present

## 2018-02-28 DIAGNOSIS — Z833 Family history of diabetes mellitus: Secondary | ICD-10-CM

## 2018-02-28 DIAGNOSIS — I2510 Atherosclerotic heart disease of native coronary artery without angina pectoris: Secondary | ICD-10-CM | POA: Diagnosis present

## 2018-02-28 DIAGNOSIS — Z981 Arthrodesis status: Secondary | ICD-10-CM

## 2018-02-28 DIAGNOSIS — E860 Dehydration: Secondary | ICD-10-CM | POA: Diagnosis not present

## 2018-02-28 DIAGNOSIS — Z79890 Hormone replacement therapy (postmenopausal): Secondary | ICD-10-CM

## 2018-02-28 DIAGNOSIS — I493 Ventricular premature depolarization: Secondary | ICD-10-CM | POA: Diagnosis present

## 2018-02-28 DIAGNOSIS — J42 Unspecified chronic bronchitis: Secondary | ICD-10-CM | POA: Diagnosis present

## 2018-02-28 DIAGNOSIS — A09 Infectious gastroenteritis and colitis, unspecified: Secondary | ICD-10-CM | POA: Diagnosis not present

## 2018-02-28 DIAGNOSIS — Z8547 Personal history of malignant neoplasm of testis: Secondary | ICD-10-CM

## 2018-02-28 DIAGNOSIS — Z818 Family history of other mental and behavioral disorders: Secondary | ICD-10-CM

## 2018-02-28 DIAGNOSIS — Z8673 Personal history of transient ischemic attack (TIA), and cerebral infarction without residual deficits: Secondary | ICD-10-CM

## 2018-02-28 DIAGNOSIS — N183 Chronic kidney disease, stage 3 (moderate): Secondary | ICD-10-CM | POA: Diagnosis present

## 2018-02-28 DIAGNOSIS — E1122 Type 2 diabetes mellitus with diabetic chronic kidney disease: Secondary | ICD-10-CM | POA: Diagnosis present

## 2018-02-28 DIAGNOSIS — M109 Gout, unspecified: Secondary | ICD-10-CM | POA: Diagnosis present

## 2018-02-28 DIAGNOSIS — E876 Hypokalemia: Secondary | ICD-10-CM | POA: Diagnosis present

## 2018-02-28 DIAGNOSIS — Z8249 Family history of ischemic heart disease and other diseases of the circulatory system: Secondary | ICD-10-CM

## 2018-02-28 DIAGNOSIS — Z9842 Cataract extraction status, left eye: Secondary | ICD-10-CM

## 2018-02-28 DIAGNOSIS — K641 Second degree hemorrhoids: Secondary | ICD-10-CM | POA: Diagnosis not present

## 2018-02-28 DIAGNOSIS — R32 Unspecified urinary incontinence: Secondary | ICD-10-CM | POA: Diagnosis present

## 2018-02-28 DIAGNOSIS — Z96652 Presence of left artificial knee joint: Secondary | ICD-10-CM | POA: Diagnosis present

## 2018-02-28 DIAGNOSIS — E785 Hyperlipidemia, unspecified: Secondary | ICD-10-CM | POA: Diagnosis present

## 2018-02-28 DIAGNOSIS — Z87442 Personal history of urinary calculi: Secondary | ICD-10-CM

## 2018-02-28 LAB — CBC WITH DIFFERENTIAL/PLATELET
Abs Immature Granulocytes: 0 10*3/uL (ref 0.0–0.1)
Basophils Absolute: 0 10*3/uL (ref 0.0–0.1)
Basophils Relative: 1 %
Eosinophils Absolute: 0.1 10*3/uL (ref 0.0–0.7)
Eosinophils Relative: 2 %
HCT: 47.8 % (ref 39.0–52.0)
Hemoglobin: 15.2 g/dL (ref 13.0–17.0)
Immature Granulocytes: 1 %
Lymphocytes Relative: 18 %
Lymphs Abs: 1.1 10*3/uL (ref 0.7–4.0)
MCH: 27.1 pg (ref 26.0–34.0)
MCHC: 31.8 g/dL (ref 30.0–36.0)
MCV: 85.2 fL (ref 78.0–100.0)
Monocytes Absolute: 0.5 10*3/uL (ref 0.1–1.0)
Monocytes Relative: 8 %
Neutro Abs: 4.1 10*3/uL (ref 1.7–7.7)
Neutrophils Relative %: 70 %
Platelets: 151 10*3/uL (ref 150–400)
RBC: 5.61 MIL/uL (ref 4.22–5.81)
RDW: 14.8 % (ref 11.5–15.5)
WBC: 5.7 10*3/uL (ref 4.0–10.5)

## 2018-02-28 LAB — COMPREHENSIVE METABOLIC PANEL
ALT: 7 U/L (ref 0–44)
AST: 34 U/L (ref 15–41)
Albumin: 3.3 g/dL — ABNORMAL LOW (ref 3.5–5.0)
Alkaline Phosphatase: 53 U/L (ref 38–126)
Anion gap: 6 (ref 5–15)
BUN: 10 mg/dL (ref 8–23)
CO2: 27 mmol/L (ref 22–32)
Calcium: 8.9 mg/dL (ref 8.9–10.3)
Chloride: 108 mmol/L (ref 98–111)
Creatinine, Ser: 1.34 mg/dL — ABNORMAL HIGH (ref 0.61–1.24)
GFR calc Af Amer: 59 mL/min — ABNORMAL LOW (ref >60)
GFR calc non Af Amer: 51 mL/min — ABNORMAL LOW (ref >60)
Glucose, Bld: 112 mg/dL — ABNORMAL HIGH (ref 70–99)
Potassium: 3.4 mmol/L — ABNORMAL LOW (ref 3.5–5.1)
Sodium: 141 mmol/L (ref 135–145)
Total Bilirubin: 1.4 mg/dL — ABNORMAL HIGH (ref 0.3–1.2)
Total Protein: 6.4 g/dL — ABNORMAL LOW (ref 6.5–8.1)

## 2018-02-28 LAB — I-STAT TROPONIN, ED: Troponin i, poc: 0 ng/mL (ref 0.00–0.08)

## 2018-02-28 LAB — TROPONIN I
Troponin I: <0.03 ng/mL (ref <0.03)
Troponin I: <0.03 ng/mL (ref <0.03)

## 2018-02-28 LAB — I-STAT CG4 LACTIC ACID, ED
Lactic Acid, Venous: 1.32 mmol/L (ref 0.5–1.9)
Lactic Acid, Venous: 1.1 mmol/L (ref 0.5–1.9)

## 2018-02-28 LAB — TSH: TSH: 0.962 u[IU]/mL (ref 0.350–4.500)

## 2018-02-28 LAB — T4, FREE: Free T4: 0.82 ng/dL (ref 0.82–1.77)

## 2018-02-28 LAB — C DIFFICILE QUICK SCREEN W PCR REFLEX
C Diff antigen: NEGATIVE
C Diff interpretation: NOT DETECTED
C Diff toxin: NEGATIVE

## 2018-02-28 MED ORDER — APIXABAN 5 MG PO TABS
5.0000 mg | ORAL_TABLET | Freq: Two times a day (BID) | ORAL | Status: DC
  Filled 2018-02-28: qty 1

## 2018-02-28 MED ORDER — ALLOPURINOL 300 MG PO TABS
300.0000 mg | ORAL_TABLET | Freq: Every day | ORAL | Status: DC
  Administered 2018-03-01 – 2018-03-03 (×3): 300 mg via ORAL
  Filled 2018-02-28 (×3): qty 1

## 2018-02-28 MED ORDER — SERTRALINE HCL 100 MG PO TABS
100.0000 mg | ORAL_TABLET | Freq: Every day | ORAL | Status: DC
  Administered 2018-03-01 – 2018-03-03 (×3): 100 mg via ORAL
  Filled 2018-02-28 (×3): qty 1

## 2018-02-28 MED ORDER — COLCHICINE 0.6 MG PO TABS
0.6000 mg | ORAL_TABLET | Freq: Every day | ORAL | Status: DC
  Administered 2018-03-01: 0.6 mg via ORAL
  Filled 2018-02-28 (×2): qty 1

## 2018-02-28 MED ORDER — SODIUM CHLORIDE 0.9 % IV BOLUS
1000.0000 mL | Freq: Once | INTRAVENOUS | Status: AC
  Administered 2018-02-28: 1000 mL via INTRAVENOUS

## 2018-02-28 MED ORDER — GI COCKTAIL ~~LOC~~: 30.0000 mL | Freq: Four times a day (QID) | ORAL | Status: DC | PRN

## 2018-02-28 MED ORDER — CARBIDOPA-LEVODOPA 25-250 MG PO TABS
2.0000 | ORAL_TABLET | Freq: Four times a day (QID) | ORAL | Status: DC
  Administered 2018-02-28 – 2018-03-03 (×7): 2 via ORAL
  Filled 2018-02-28 (×12): qty 2

## 2018-02-28 MED ORDER — ASPIRIN 81 MG PO CHEW
324.0000 mg | CHEWABLE_TABLET | Freq: Once | ORAL | Status: AC
  Administered 2018-02-28: 324 mg via ORAL
  Filled 2018-02-28: qty 4

## 2018-02-28 MED ORDER — MIDODRINE HCL 5 MG PO TABS: 5.0000 mg | ORAL_TABLET | Freq: Every day | ORAL | Status: DC | PRN

## 2018-02-28 MED ORDER — TOPIRAMATE 25 MG PO TABS
50.0000 mg | ORAL_TABLET | Freq: Two times a day (BID) | ORAL | Status: DC
  Administered 2018-02-28 – 2018-03-03 (×6): 50 mg via ORAL
  Filled 2018-02-28 (×6): qty 2

## 2018-02-28 MED ORDER — HYDROCORTISONE 10 MG PO TABS
10.0000 mg | ORAL_TABLET | Freq: Every day | ORAL | Status: DC
  Administered 2018-03-01 – 2018-03-02 (×2): 10 mg via ORAL
  Filled 2018-02-28 (×3): qty 1

## 2018-02-28 MED ORDER — ASPIRIN EC 81 MG PO TBEC
81.0000 mg | DELAYED_RELEASE_TABLET | Freq: Every day | ORAL | Status: DC
  Administered 2018-03-01 – 2018-03-02 (×2): 81 mg via ORAL
  Filled 2018-02-28 (×2): qty 1

## 2018-02-28 MED ORDER — ATORVASTATIN CALCIUM 20 MG PO TABS
20.0000 mg | ORAL_TABLET | Freq: Every day | ORAL | Status: DC
  Administered 2018-03-01 – 2018-03-03 (×3): 20 mg via ORAL
  Filled 2018-02-28 (×3): qty 1

## 2018-02-28 MED ORDER — HYDROCORTISONE 20 MG PO TABS
20.0000 mg | ORAL_TABLET | Freq: Every day | ORAL | Status: DC
  Administered 2018-03-01 – 2018-03-03 (×3): 20 mg via ORAL
  Filled 2018-02-28 (×2): qty 2
  Filled 2018-02-28: qty 1

## 2018-02-28 MED ORDER — ONDANSETRON HCL 4 MG/2ML IJ SOLN: 4.0000 mg | Freq: Four times a day (QID) | INTRAMUSCULAR | Status: DC | PRN

## 2018-02-28 MED ORDER — LEVOTHYROXINE SODIUM 112 MCG PO TABS
224.0000 ug | ORAL_TABLET | Freq: Every day | ORAL | Status: DC
  Administered 2018-03-01 – 2018-03-03 (×3): 224 ug via ORAL
  Filled 2018-02-28 (×3): qty 2

## 2018-02-28 MED ORDER — ACETAMINOPHEN 325 MG PO TABS: 650.0000 mg | ORAL_TABLET | ORAL | Status: DC | PRN

## 2018-02-28 MED ORDER — SODIUM CHLORIDE 0.9 % IV SOLN
INTRAVENOUS | Status: DC
  Administered 2018-02-28 – 2018-03-01 (×5): via INTRAVENOUS

## 2018-02-28 NOTE — Progress Notes (Signed)
Pt set up with CPAP and a nasal mask.  Pt tolerating CPAP well with pressure of 14 as per patient request.
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
## 2018-02-28 NOTE — ED Triage Notes (Signed)
Pt in c/o severe diarrhea for the last three weeks, seen last week and r/o of cdiff, pt states symptoms keep worsening and he is getting weaker

## 2018-02-28 NOTE — ED Notes (Signed)
Pt informed that a stool sample is needed. Pt unable to go at this time.
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
## 2018-02-28 NOTE — ED Notes (Signed)
Pt given water and coke for PO challenge.

## 2018-02-28 NOTE — ED Notes (Addendum)
MD Regenia Skeeter notified of new onset centralized stabbing CP that radiates to left arm, pt states it was a 8/10 and is now a 4/10.  Pt mentating well but reports some SOB, satting 100% RA. EKG completed and istat troponin.  Will continue to monitor.

## 2018-03-01 ENCOUNTER — Ambulatory Visit: Payer: Medicare Other | Admitting: Internal Medicine

## 2018-03-01 ENCOUNTER — Encounter (HOSPITAL_COMMUNITY): Payer: Self-pay | Admitting: Anesthesiology

## 2018-03-01 DIAGNOSIS — E039 Hypothyroidism, unspecified: Secondary | ICD-10-CM | POA: Diagnosis present

## 2018-03-01 DIAGNOSIS — K5000 Crohn's disease of small intestine without complications: Secondary | ICD-10-CM | POA: Diagnosis not present

## 2018-03-01 DIAGNOSIS — E7800 Pure hypercholesterolemia, unspecified: Secondary | ICD-10-CM | POA: Diagnosis present

## 2018-03-01 DIAGNOSIS — E271 Primary adrenocortical insufficiency: Secondary | ICD-10-CM | POA: Diagnosis present

## 2018-03-01 DIAGNOSIS — G20 Parkinson's disease: Secondary | ICD-10-CM | POA: Diagnosis not present

## 2018-03-01 DIAGNOSIS — K633 Ulcer of intestine: Secondary | ICD-10-CM | POA: Diagnosis present

## 2018-03-01 DIAGNOSIS — K641 Second degree hemorrhoids: Secondary | ICD-10-CM | POA: Diagnosis not present

## 2018-03-01 DIAGNOSIS — E785 Hyperlipidemia, unspecified: Secondary | ICD-10-CM | POA: Diagnosis present

## 2018-03-01 DIAGNOSIS — N179 Acute kidney failure, unspecified: Secondary | ICD-10-CM | POA: Diagnosis present

## 2018-03-01 DIAGNOSIS — I130 Hypertensive heart and chronic kidney disease with heart failure and stage 1 through stage 4 chronic kidney disease, or unspecified chronic kidney disease: Secondary | ICD-10-CM | POA: Diagnosis present

## 2018-03-01 DIAGNOSIS — E876 Hypokalemia: Secondary | ICD-10-CM | POA: Diagnosis present

## 2018-03-01 DIAGNOSIS — E669 Obesity, unspecified: Secondary | ICD-10-CM | POA: Diagnosis present

## 2018-03-01 DIAGNOSIS — I2510 Atherosclerotic heart disease of native coronary artery without angina pectoris: Secondary | ICD-10-CM | POA: Diagnosis present

## 2018-03-01 DIAGNOSIS — Z6837 Body mass index (BMI) 37.0-37.9, adult: Secondary | ICD-10-CM | POA: Diagnosis not present

## 2018-03-01 DIAGNOSIS — R197 Diarrhea, unspecified: Secondary | ICD-10-CM

## 2018-03-01 DIAGNOSIS — A09 Infectious gastroenteritis and colitis, unspecified: Secondary | ICD-10-CM | POA: Diagnosis present

## 2018-03-01 DIAGNOSIS — I252 Old myocardial infarction: Secondary | ICD-10-CM | POA: Diagnosis not present

## 2018-03-01 DIAGNOSIS — I10 Essential (primary) hypertension: Secondary | ICD-10-CM | POA: Diagnosis not present

## 2018-03-01 DIAGNOSIS — I480 Paroxysmal atrial fibrillation: Secondary | ICD-10-CM | POA: Diagnosis not present

## 2018-03-01 DIAGNOSIS — I5032 Chronic diastolic (congestive) heart failure: Secondary | ICD-10-CM | POA: Diagnosis present

## 2018-03-01 DIAGNOSIS — R079 Chest pain, unspecified: Secondary | ICD-10-CM | POA: Diagnosis not present

## 2018-03-01 DIAGNOSIS — K52831 Collagenous colitis: Secondary | ICD-10-CM | POA: Diagnosis present

## 2018-03-01 DIAGNOSIS — K529 Noninfective gastroenteritis and colitis, unspecified: Secondary | ICD-10-CM | POA: Diagnosis not present

## 2018-03-01 DIAGNOSIS — I493 Ventricular premature depolarization: Secondary | ICD-10-CM | POA: Diagnosis present

## 2018-03-01 DIAGNOSIS — E860 Dehydration: Secondary | ICD-10-CM

## 2018-03-01 DIAGNOSIS — K6289 Other specified diseases of anus and rectum: Secondary | ICD-10-CM | POA: Diagnosis not present

## 2018-03-01 DIAGNOSIS — J42 Unspecified chronic bronchitis: Secondary | ICD-10-CM | POA: Diagnosis present

## 2018-03-01 DIAGNOSIS — E1122 Type 2 diabetes mellitus with diabetic chronic kidney disease: Secondary | ICD-10-CM | POA: Diagnosis present

## 2018-03-01 DIAGNOSIS — Q438 Other specified congenital malformations of intestine: Secondary | ICD-10-CM | POA: Diagnosis not present

## 2018-03-01 DIAGNOSIS — G4733 Obstructive sleep apnea (adult) (pediatric): Secondary | ICD-10-CM | POA: Diagnosis not present

## 2018-03-01 DIAGNOSIS — D696 Thrombocytopenia, unspecified: Secondary | ICD-10-CM | POA: Diagnosis not present

## 2018-03-01 DIAGNOSIS — I700 Atherosclerosis of aorta: Secondary | ICD-10-CM | POA: Diagnosis present

## 2018-03-01 DIAGNOSIS — K644 Residual hemorrhoidal skin tags: Secondary | ICD-10-CM | POA: Diagnosis not present

## 2018-03-01 LAB — GASTROINTESTINAL PANEL BY PCR, STOOL (REPLACES STOOL CULTURE)

## 2018-03-01 LAB — BASIC METABOLIC PANEL
Anion gap: 7 (ref 5–15)
BUN: 10 mg/dL (ref 8–23)
CO2: 23 mmol/L (ref 22–32)
Calcium: 8.2 mg/dL — ABNORMAL LOW (ref 8.9–10.3)
Chloride: 108 mmol/L (ref 98–111)
Creatinine, Ser: 1.04 mg/dL (ref 0.61–1.24)
GFR calc Af Amer: >60 mL/min (ref >60)
GFR calc non Af Amer: >60 mL/min (ref >60)
Glucose, Bld: 101 mg/dL — ABNORMAL HIGH (ref 70–99)
Potassium: 3.4 mmol/L — ABNORMAL LOW (ref 3.5–5.1)
Sodium: 138 mmol/L (ref 135–145)

## 2018-03-01 LAB — HEPARIN LEVEL (UNFRACTIONATED)
Heparin Unfractionated: 0.58 IU/mL (ref 0.30–0.70)
Heparin Unfractionated: 0.7 IU/mL (ref 0.30–0.70)

## 2018-03-01 LAB — TROPONIN I: Troponin I: <0.03 ng/mL (ref <0.03)

## 2018-03-01 LAB — APTT
aPTT: 35 seconds (ref 24–36)
aPTT: 95 seconds — ABNORMAL HIGH (ref 24–36)

## 2018-03-01 MED ORDER — PEG-KCL-NACL-NASULF-NA ASC-C 100 G PO SOLR
0.5000 | Freq: Once | ORAL | Status: AC
  Administered 2018-03-02: 100 g via ORAL

## 2018-03-01 MED ORDER — METOCLOPRAMIDE HCL 5 MG/ML IJ SOLN: 10.0000 mg | Freq: Once | INTRAMUSCULAR | Status: DC

## 2018-03-01 MED ORDER — PEG-KCL-NACL-NASULF-NA ASC-C 100 G PO SOLR
0.5000 | Freq: Once | ORAL | Status: AC
  Administered 2018-03-01: 100 g via ORAL
  Filled 2018-03-01: qty 1

## 2018-03-01 MED ORDER — PEG-KCL-NACL-NASULF-NA ASC-C 100 G PO SOLR: 1.0000 | Freq: Once | ORAL | Status: DC

## 2018-03-01 MED ORDER — HEPARIN (PORCINE) IN NACL 100-0.45 UNIT/ML-% IJ SOLN
1700.0000 [IU]/h | INTRAMUSCULAR | Status: DC
  Administered 2018-03-01 – 2018-03-02 (×2): 1700 [IU]/h via INTRAVENOUS
  Filled 2018-03-01 (×2): qty 250

## 2018-03-01 MED ORDER — POTASSIUM CHLORIDE CRYS ER 20 MEQ PO TBCR
40.0000 meq | EXTENDED_RELEASE_TABLET | Freq: Once | ORAL | Status: AC
  Administered 2018-03-01: 40 meq via ORAL
  Filled 2018-03-01: qty 2

## 2018-03-01 MED ORDER — METOCLOPRAMIDE HCL 5 MG/ML IJ SOLN
10.0000 mg | Freq: Once | INTRAMUSCULAR | Status: AC
  Administered 2018-03-01: 10 mg via INTRAVENOUS
  Filled 2018-03-01: qty 2
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
## 2018-03-01 NOTE — Telephone Encounter (Signed)
Went to ED

## 2018-03-01 NOTE — Anesthesia Preprocedure Evaluation (Deleted)
Anesthesia Evaluation    Reviewed: Allergy & Precautions, Patient's Chart, lab work & pertinent test results  History of Anesthesia Complications (+) PONV and history of anesthetic complications  Airway        Dental   Pulmonary sleep apnea and Continuous Positive Airway Pressure Ventilation ,           Cardiovascular hypertension, + CAD, + Past MI and +CHF    ECG: Sinus rhythm, rate 65, with occasional Premature ventricular complexes. Left axis deviation  LNQ11 Reveal LINQ   ECHO: Technically difficult study. LVEF 60-65%, severe LVH, normal wall motion, grade 1 DD, indeterminate LV filling pressure, normal LA size, normal IVC.   Pre-op eval per cardiology Clinical research associate)   Neuro/Psych PSYCHIATRIC DISORDERS Depression Parkinson's disease  TIACVA    GI/Hepatic negative GI ROS, Neg liver ROS,   Endo/Other  Hypothyroidism Addison's disease   Renal/GU negative Renal ROS     Musculoskeletal Gout Chronic lower back pain   Abdominal (+) + obese,   Peds  Hematology HLD   Anesthesia Other Findings diarrhea for 3 plus weeks  Reproductive/Obstetrics                            Anesthesia Physical Anesthesia Plan  ASA: III  Anesthesia Plan: MAC   Post-op Pain Management:    Induction: Intravenous  PONV Risk Score and Plan: 2 and Propofol infusion and Treatment may vary due to age or medical condition  Airway Management Planned: Nasal Cannula  Additional Equipment:   Intra-op Plan:   Post-operative Plan:   Informed Consent:   Plan Discussed with:   Anesthesia Plan Comments:         Anesthesia Quick Evaluation

## 2018-03-01 NOTE — Progress Notes (Signed)
          Daily Rounding Note  03/01/2018, 11:01 AM  LOS: 0 days   SUBJECTIVE:   Chief complaint: still having diarrhea.        OBJECTIVE:         Vital signs in last 24 hours:    Temp:  [97.7 F (36.5 C)-97.9 F (36.6 C)] 97.9 F (36.6 C) (08/22 0446) Pulse Rate:  [50-92] 69 (08/22 0446) Resp:  [10-21] 18 (08/22 0446) BP: (90-135)/(64-88) 109/64 (08/22 0446) SpO2:  [95 %-100 %] 100 % (08/22 0446) Weight:  [144.2 kg-144.7 kg] 144.6 kg (08/22 0446) Last BM Date: 02/28/18 Filed Weights   02/28/18 1237 02/28/18 1800 03/01/18 0446  Weight: (!) 144.2 kg (!) 144.7 kg (!) 144.6 kg   General: Obese, looks somewhat unhealthy but not acutely ill Heart: RRR. Chest: Clear bilaterally.  No labored breathing or cough. Abdomen: Soft.  Not tender or distended.  Bowel sounds active. Extremities: Pedal/ankle edema. Neuro/Psych: Oriented x3.  Fluid speech.  No gross deficits.  Intake/Output from previous day: 08/21 0701 - 08/22 0700 In: 2249.2 [P.O.:360; I.V.:1889.2] Out: 2 [Urine:1; Stool:1]  Intake/Output this shift: Total I/O In: 360 [P.O.:360] Out: -   Lab Results: Recent Labs    02/28/18 1150  WBC 5.7  HGB 15.2  HCT 47.8  PLT 151   BMET Recent Labs    02/28/18 1150 03/01/18 0747  NA 141 138  K 3.4* 3.4*  CL 108 108  CO2 27 23  GLUCOSE 112* 101*  BUN 10 10  CREATININE 1.34* 1.04  CALCIUM 8.9 8.2*   LFT Recent Labs    02/28/18 1150  PROT 6.4*  ALBUMIN 3.3*  AST 34  ALT 7  ALKPHOS 53  BILITOT 1.4*   PT/INR No results for input(s): LABPROT, INR in the last 72 hours. Hepatitis Panel No results for input(s): HEPBSAG, HCVAB, HEPAIGM, HEPBIGM in the last 72 hours.  Studies/Results: Dg Chest Portable 1 View  Result Date: 02/28/2018 CLINICAL DATA:  Chest pain EXAM: PORTABLE CHEST 1 VIEW COMPARISON:  06/26/2017 FINDINGS: Stable cardiomegaly. Loop recording device projects over the left heart shadow. Mild  aortic atherosclerosis without definite aneurysm. No acute pulmonary consolidations. Slight vascular congestion is noted without effusion or pneumothorax. Probable left basilar subsegmental atelectasis or slight pleural thickening. No acute osseous abnormality. IMPRESSION: Low lung volumes with stable cardiomegaly and mild aortic atherosclerosis. Mild central vascular congestion. Electronically Signed   By: Ashley Royalty M.D.   On: 02/28/2018 13:55    ASSESMENT:   *  3 weeks diarrhea C diff neg 8/13,   *  Recent gout flare and cellulitis late 12/2017.  On 1 x daily Colchicine.  Wonder if this is cause of diarrhea.    *  Mild AKI due to volume depletion from diarrhea.    *   Chronic Eliquis, on hold.  Last dose was 8/20 .  Heparin IV in place.    *  OSA on CPAP.     PLAN   *   Colonoscopy tomorrow morning.  Patient agreeable.  Will hold heparin a few hours before the procedure.    Azucena Freed  03/01/2018, 11:01 AM Phone [phone]

## 2018-03-01 NOTE — Progress Notes (Signed)
ANTICOAGULATION CONSULT NOTE - [place] for heparin Indication: atrial fibrillation  Allergies  Allergen Reactions  . Bee Venom Anaphylaxis  . Shrimp [Shellfish Allergy] Anaphylaxis and Other (See Comments)    "just shrimp"  . Stadol [Butorphanol] Anaphylaxis and Other (See Comments)    respiratory  Distress, couldn't breathe, cardiac arrest  . Wasp Venom Anaphylaxis    Patient Measurements: Height: 6\' 5"  (195.6 cm) Weight: (!) 318 lb 11.2 oz (144.6 kg) IBW/kg (Calculated) : 89.1 Heparin Dosing Weight: 121.4 kg  Vital Signs: Temp: 98 F (36.7 C) (08/22 1500) Temp Source: Oral (08/22 1500) BP: 105/63 (08/22 1500) Pulse Rate: 63 (08/22 1500)  Labs: Recent Labs    02/28/18 1150 02/28/18 1535 02/28/18 2040 03/01/18 0229 03/01/18 0747 03/01/18 1843  HGB 15.2  --   --   --   --   --   HCT 47.8  --   --   --   --   --   PLT 151  --   --   --   --   --   APTT  --   --   --   --  35 95*  HEPARINUNFRC  --   --   --   --  0.58 0.70  CREATININE 1.34*  --   --   --  1.04  --   TROPONINI  --  <0.03 <0.03 <0.03  --   --     Estimated Creatinine Clearance: 101.1 mL/min (by C-G formula based on SCr of 1.04 mg/dL).   Medical History: Past Medical History:  Diagnosis Date  . Addison's disease ([place]) 01/2016  . Arthritis    low back - DDD  . Cellulitis, scrotum 08/02/2014  . Chronic diastolic CHF (congestive heart failure) ([place])    Echo 12/18: severe LVH, EF 60-65, Gr 1 DD // Echo 5/18: EF 65-70, Gr 1 DD  . Chronic lower back pain    "from [place] 2007"  . Complication of anesthesia    Sometimes has N&V /w anesth.   . Coronary artery disease    NSTEMI >> LHC 9/01: prox and mid LAD 50-70; mRCA 40 >> med Rx // Nuc 8/13 [facility]):  no infarct or ischemia, EF 59  . Elevated PSA   . Epididymitis, left 08/04/2014  . History of chronic bronchitis   . History of gout   . History of stroke 01/22/2016   2004 - "right brain stem; no residual " // 2017  .  Hypertension   . Hypocholesteremia   . Hypothyroidism   . Infection of skin due to methicillin resistant Staphylococcus aureus (MRSA) 12/28/2017  . Kidney stone   . Myocardial infarction [facility]) 2001   2001- cardiac cath., cardiac clearanece note dr Otho Perl 05-14-13 on chart, stress test results 02-21-12 on chart  . OSA on CPAP    cpap setting of 10  . Parkinson's disease ([place])    stage 4   . Pneumonia 2000's and 2013  . PONV (postoperative nausea and vomiting)   . [facility] spotted fever   . Testicular cancer ([place]) 2015    Medications:  Medications Prior to Admission  Medication Sig Dispense Refill Last Dose  . allopurinol (ZYLOPRIM) 300 MG tablet Take 1 tablet (300 mg total) by mouth daily. 30 tablet 11 02/28/2018 at Unknown time  . apixaban (ELIQUIS) 5 MG TABS tablet Take 5 mg by mouth 2 (two) times daily.   02/28/2018 at 0800  . atorvastatin (LIPITOR) 20 MG tablet  TAKE 1 TABLET(20 MG) BY MOUTH DAILY (Patient taking differently: Take 20 mg by mouth daily. ) 30 tablet 11 02/28/2018 at Unknown time  . carbidopa-levodopa (SINEMET IR) 25-250 MG tablet Take 2 tablets by mouth 4 (four) times daily.   02/28/2018 at Unknown time  . Cholecalciferol (VITAMIN D PO) Take 500 Units by mouth daily.   02/28/2018 at Unknown time  . colchicine 0.6 MG tablet Take 1 tablet (0.6 mg total) by mouth 2 (two) times daily. (Patient taking differently: Take 0.6 mg by mouth daily. ) 10 tablet 0 02/28/2018 at Unknown time  . EPINEPHrine 0.3 mg/0.3 mL IJ SOAJ injection Inject 0.3 mLs (0.3 mg total) into the muscle as needed (allergic reaction). 1 Device 12 prn  . gatifloxacin (ZYMAXID) 0.5 % SOLN Place 1 drop into the left eye 3 (three) times daily.  0 02/28/2018 at Unknown time  . hydrocortisone (CORTEF) 10 MG tablet 2 tablets in the morning and 1 tablet at 5 PM. Please start taking on 06/18/17 (Patient taking differently: Take 10-20 mg by mouth See admin instructions. 2 tablets in the morning and 1 tablet at 5 PM. Please  start taking on 06/18/17) 90 tablet 0 02/28/2018 at Unknown time  . levothyroxine (SYNTHROID, LEVOTHROID) 112 MCG tablet TAKE 2 TABLETS(224 MCG) BY MOUTH DAILY BEFORE BREAKFAST (Patient taking differently: Take 224 mcg by mouth daily before breakfast. ) 180 tablet 0 02/28/2018 at Unknown time  . midodrine (PROAMATINE) 5 MG tablet TAKE 1 TABLET(5 MG) BY MOUTH TWICE DAILY WITH A MEAL (Patient taking differently: Take 5 mg by mouth daily as needed (if blood pressure drop <90/40). ) 180 tablet 1 never  . Multiple Vitamin (MULTIVITAMIN WITH MINERALS) TABS tablet Take 1 tablet by mouth daily. 30 tablet 0 02/28/2018 at Unknown time  . sertraline (ZOLOFT) 100 MG tablet Take 100 mg by mouth daily.   02/28/2018 at Unknown time  . testosterone cypionate (DEPO-TESTOSTERONE) 200 MG/ML injection Inject 0.75 mLs (150 mg total) into the muscle every 14 (fourteen) days. 10 mL 5 02/28/2018 at Unknown time  . topiramate (TOPAMAX) 50 MG tablet Take 1 tablet (50 mg total) by mouth 2 (two) times daily. 180 tablet 3 02/28/2018 at Unknown time    Assessment: 72 yo man on eliquis PTA for afib, h/o CVA to start heparin bridge for planned colonoscopy. Last dose eliquis unknown as of now, did not receive any doses here. Will monitor aPTT and heparin levels initially  Initial heparin level and aPTT therapeutic at 0.70 and 95 seconds. Heparin level appears slightly altered by recent Eliquis dose so will dose off of aPTT and recheck both levels in 8hr.  Goal of Therapy:  APTT 66-102 sec Heparin level 0.3-.07 units/ml Monitor platelets by anticoagulation protocol: Yes   Plan:  Continue IV heparin 1700 units/hr Check 8hr confirmatory level  Arrie Senate, PharmD, BCPS Clinical Pharmacist [phone] Please check AMION for all [place] numbers 03/01/2018

## 2018-03-01 NOTE — Progress Notes (Signed)
ANTICOAGULATION CONSULT NOTE - Initial Consult  Pharmacy Consult for heparin Indication: atrial fibrillation  Allergies  Allergen Reactions  . Bee Venom Anaphylaxis  . Shrimp [Shellfish Allergy] Anaphylaxis and Other (See Comments)    "just shrimp"  . Stadol [Butorphanol] Anaphylaxis and Other (See Comments)    respiratory  Distress, couldn't breathe, cardiac arrest  . Wasp Venom Anaphylaxis    Patient Measurements: Height: 6\' 5"  (195.6 cm) Weight: (!) 318 lb 11.2 oz (144.6 kg) IBW/kg (Calculated) : 89.1 Heparin Dosing Weight: 121.4 kg  Vital Signs: Temp: 97.9 F (36.6 C) (08/22 0446) Temp Source: Oral (08/22 0446) BP: 109/64 (08/22 0446) Pulse Rate: 69 (08/22 0446)  Labs: Recent Labs    02/28/18 1150 02/28/18 1535 02/28/18 2040 03/01/18 0229  HGB 15.2  --   --   --   HCT 47.8  --   --   --   PLT 151  --   --   --   CREATININE 1.34*  --   --   --   TROPONINI  --  <0.03 <0.03 <0.03    Estimated Creatinine Clearance: 78.4 mL/min (A) (by C-G formula based on SCr of 1.34 mg/dL (H)).   Medical History: Past Medical History:  Diagnosis Date  . Addison's disease ([place]) 01/2016  . Arthritis    low back - DDD  . Cellulitis, scrotum 08/02/2014  . Chronic diastolic CHF (congestive heart failure) ([place])    Echo 12/18: severe LVH, EF 60-65, Gr 1 DD // Echo 5/18: EF 65-70, Gr 1 DD  . Chronic lower back pain    "from [place] 2007"  . Complication of anesthesia    Sometimes has N&V /w anesth.   . Coronary artery disease    NSTEMI >> LHC 9/01: prox and mid LAD 50-70; mRCA 40 >> med Rx // Nuc 8/13 [facility]):  no infarct or ischemia, EF 59  . Elevated PSA   . Epididymitis, left 08/04/2014  . History of chronic bronchitis   . History of gout   . History of stroke 01/22/2016   2004 - "right brain stem; no residual " // 2017  . Hypertension   . Hypocholesteremia   . Hypothyroidism   . Infection of skin due to methicillin resistant Staphylococcus aureus (MRSA) 12/28/2017  .  Kidney stone   . Myocardial infarction [facility]) 2001   2001- cardiac cath., cardiac clearanece note dr Otho Perl 05-14-13 on chart, stress test results 02-21-12 on chart  . OSA on CPAP    cpap setting of 10  . Parkinson's disease ([place])    stage 4   . Pneumonia 2000's and 2013  . PONV (postoperative nausea and vomiting)   . [facility] spotted fever   . Testicular cancer ([place]) 2015    Medications:  Medications Prior to Admission  Medication Sig Dispense Refill Last Dose  . allopurinol (ZYLOPRIM) 300 MG tablet Take 1 tablet (300 mg total) by mouth daily. 30 tablet 11 Taking  . apixaban (ELIQUIS) 5 MG TABS tablet Take 5 mg by mouth 2 (two) times daily.   Taking  . atorvastatin (LIPITOR) 20 MG tablet TAKE 1 TABLET(20 MG) BY MOUTH DAILY (Patient taking differently: Take 20 mg by mouth daily. ) 30 tablet 11 Taking  . carbidopa-levodopa (SINEMET IR) 25-250 MG tablet Take 2 tablets by mouth 4 (four) times daily.   Taking  . Cholecalciferol (VITAMIN D PO) Take 500 Units by mouth daily.   Not Taking  . colchicine 0.6 MG tablet Take 1 tablet (0.6  mg total) by mouth 2 (two) times daily. (Patient taking differently: Take 0.6 mg by mouth daily. ) 10 tablet 0 Taking  . docusate sodium (COLACE) 100 MG capsule Take 100 mg by mouth 2 (two) times daily.   Taking  . EPINEPHrine 0.3 mg/0.3 mL IJ SOAJ injection Inject 0.3 mLs (0.3 mg total) into the muscle as needed (allergic reaction). 1 Device 12 Taking  . gatifloxacin (ZYMAXID) 0.5 % SOLN Place 1 drop into the left eye 3 (three) times daily.  0 Taking  . hydrocortisone (CORTEF) 10 MG tablet 2 tablets in the morning and 1 tablet at 5 PM. Please start taking on 06/18/17 (Patient taking differently: Take 10-20 mg by mouth See admin instructions. 2 tablets in the morning and 1 tablet at 5 PM. Please start taking on 06/18/17) 90 tablet 0 Taking  . levothyroxine (SYNTHROID, LEVOTHROID) 112 MCG tablet TAKE 2 TABLETS(224 MCG) BY MOUTH DAILY BEFORE BREAKFAST 180 tablet 0  Taking  . midodrine (PROAMATINE) 5 MG tablet TAKE 1 TABLET(5 MG) BY MOUTH TWICE DAILY WITH A MEAL (Patient taking differently: Take 5 mg by mouth daily as needed (if blood pressure drop <110/40). ) 180 tablet 1 Taking  . Multiple Vitamin (MULTIVITAMIN WITH MINERALS) TABS tablet Take 1 tablet by mouth daily. 30 tablet 0 Taking  . sertraline (ZOLOFT) 100 MG tablet Take 100 mg by mouth daily.   Taking  . testosterone cypionate (DEPO-TESTOSTERONE) 200 MG/ML injection Inject 0.75 mLs (150 mg total) into the muscle every 14 (fourteen) days. 10 mL 5 Taking  . topiramate (TOPAMAX) 50 MG tablet Take 1 tablet (50 mg total) by mouth 2 (two) times daily. 180 tablet 3 Taking    Assessment: 72 yo man on eliquis PTA for afib, h/o CVA to start heparin bridge for planned colonoscopy. Last dose eliquis unknown as of now, did not receive any doses here. Will monitor aPTT and heparin levels initially Goal of Therapy:  APTT 66-102 sec Heparin level 0.3-.07 units/ml Monitor platelets by anticoagulation protocol: Yes   Plan:  Check baseline aPTT and heparin level Start heparin drip with no bolus at 1700 units/hr Check aPTT and heparin level 8 hours after start  Tayjah Lobdell Poteet 03/01/2018,7:47 AM

## 2018-03-01 NOTE — Progress Notes (Signed)
PROGRESS NOTE   Dennis Gentle Sr.  RSW:[id number]    DOB: [birth year]    DOA: 02/28/2018  PCP: Dennis Beck, FNP   I have briefly reviewed patients previous medical records in [facility].  Brief Narrative:  72 year old male with PMH of HTN, HLD, hypothyroid, OSA on CPAP, CAD, NSTEMI, chronic diastolic CHF, Eliquis, Parkinson's disease, Addison's disease, stroke, testicular cancer presented to ED with history of chronic diarrhea of 3 weeks duration which reportedly started after contrasted CT at [facility], worsened to a point of fecal incontinence.  Seen in ED 8/13: C. difficile negative, treated with Cipro and Flagyl for suspected colitis.  Last colonoscopy 8 years prior.  While in ED developed chest pain with radiation to left arm.  [facility] cardiology consulted.  GI plan colonoscopy 8/23.  Eliquis held and bridging with IV heparin prior to procedure.     Assessment & Plan:   Principal Problem:   Diarrhea Active Problems:   Sleep apnea   Acquired hypothyroidism   Hypercholesterolemia   Essential hypertension   Addison disease ([name])   Primary Parkinsonism (HCC)   Paroxysmal atrial fibrillation (HCC)   Chest pain   Diarrhea: Unclear etiology.  DD: Post antibiotic diarrhea, microscopic colitis, drug reaction/colchicine for recent gout flare, infectious etiology felt less likely, collagenous colitis.  C. difficile testing twice recently negative.  GI panel negative.  Blood cultures negative to date.  As per GI recommendations, held Eliquis and transition to IV heparin which can be turned off few hours prior to procedure.  Colonoscopy scheduled for 8/23.  Discussed with GI team.  Acute kidney injury: Secondary to GI losses and dehydration.  Resolved.  Dehydration: Secondary to GI losses.  Improved with IV fluids.  Continue gentle IV fluid hydration.  Hypokalemia: Secondary to GI losses.  Replace and follow.  Atypical chest pain: Experienced brief episode of  chest pain in ED lasting approximately 30 seconds that resolved spontaneously without recurrence.  Troponins negative.  Cardiology input appreciated.  History of non-STEMI in the past for which she had cardiac catheterization that showed minimal CAD.  At this point cardiology do not think that he needs any additional work-up and have cleared him from cardiology standpoint to have GI procedures.  Paroxysmal A. fib: Controlled ventricular rate.  As stated above, Eliquis changed to IV heparin peri-colonoscopy. CHA2DS2VASc =6 (age, CHF, HTN, CVA, vascular disease)  History of multiple strokes: Anticoagulation as above.  OSA: Continue nightly CPAP.  Hypothyroid: Clinically euthyroid.  Continue Synthroid.  History of Addison's disease: Hemodynamically stable.  Continue hydrocortisone 20 mg daily and 10 mg at bedtime.  Parkinson's disease: Continue Sinemet.  History of gout: No acute flare.  Remains on allopurinol and colchicine.?  Consider stopping colchicine.  Hyperlipidemia: Continue statins.  Essential hypertension: Controlled.  Not on antihypertensives.  Has been on midodrine for intermittent hypotension.   DVT prophylaxis: Currently on IV heparin infusion. Code Status: Full Family Communication: None at bedside Disposition: DC home pending clinical improvement and further work-up.  Due to patient's ongoing profuse significant diarrhea of 3 weeks duration leading to dehydration, acute kidney injury, electrolyte abnormalities, unresolved despite ED visit and treatment, he warrants further evaluation as recommended by GI consultation including colonoscopy tomorrow and further therapies to treat him.  Thereby he meets inpatient criteria and hence changed.   Consultants:  [facility]  Procedures:  None  Antimicrobials:  None   Subjective: No recurrence of chest pain.  Ongoing diarrhea without significant change  since admission.  Multiple times, watery without blood or  pain.  ROS: As above, otherwise negative.  Objective:  Vitals:   02/28/18 1900 03/01/18 0012 03/01/18 0446 03/01/18 1500  BP: 113/67 135/78 109/64 105/63  Pulse:  63 69 63  Resp:  17 18 20   Temp:   97.9 F (36.6 C) 98 F (36.7 C)  TempSrc:   Oral Oral  SpO2:  100% 100% 100%  Weight:   (!) 144.6 kg   Height:        Examination:  General exam: Does not elderly male, moderately built and obese lying comfortably supine in bed.  Oral mucosa moist. Respiratory system: Clear to auscultation. Respiratory effort normal. Cardiovascular system: S1 & S2 heard, irregularly irregular. No JVD, murmurs, rubs, gallops or clicks. No pedal edema. Gastrointestinal system: Abdomen is nondistended, soft and nontender. No organomegaly or masses felt. Normal bowel sounds heard. Central nervous system: Alert and oriented. No focal neurological deficits. Extremities: Symmetric 5 x 5 power. Skin: No rashes, lesions or ulcers Psychiatry: Judgement and insight appear normal. Mood & affect appropriate.     Data Reviewed: I have personally reviewed following labs and imaging studies  CBC: Recent Labs  Lab 02/28/18 1150  WBC 5.7  NEUTROABS 4.1  HGB 15.2  HCT 47.8  MCV 85.2  PLT 465   Basic Metabolic Panel: Recent Labs  Lab 02/28/18 1150 03/01/18 0747  NA 141 138  K 3.4* 3.4*  CL 108 108  CO2 27 23  GLUCOSE 112* 101*  BUN 10 10  CREATININE 1.34* 1.04  CALCIUM 8.9 8.2*   Liver Function Tests: Recent Labs  Lab 02/28/18 1150  AST 34  ALT 7  ALKPHOS 53  BILITOT 1.4*  PROT 6.4*  ALBUMIN 3.3*   Cardiac Enzymes: Recent Labs  Lab 02/28/18 1535 02/28/18 2040 03/01/18 0229  TROPONINI <0.03 <0.03 <0.03     Recent Results (from the past 240 hour(s))  C difficile quick scan w PCR reflex     Status: None   Collection Time: 02/20/18  1:32 PM  Result Value Ref Range Status   C Diff antigen NEGATIVE NEGATIVE Final   C Diff toxin NEGATIVE NEGATIVE Final   C Diff interpretation No  C. difficile detected.  Final    Comment: Performed at [facility], [place] [address] [place], [place] [address]  Culture, blood (Routine x 2)     Status: None (Preliminary result)   Collection Time: 02/28/18 12:45 PM  Result Value Ref Range Status   Specimen Description BLOOD RIGHT ANTECUBITAL  Final   Special Requests   Final    BOTTLES DRAWN AEROBIC AND ANAEROBIC Blood Culture adequate volume   Culture   Final    NO GROWTH < 24 HOURS Performed at [facility], [place] [address] [place], [place] [address]    Report Status PENDING  Incomplete  Culture, blood (Routine x 2)     Status: None (Preliminary result)   Collection Time: 02/28/18 12:47 PM  Result Value Ref Range Status   Specimen Description BLOOD RIGHT ARM  Final   Special Requests   Final    BOTTLES DRAWN AEROBIC AND ANAEROBIC Blood Culture results may not be optimal due to an inadequate volume of blood received in culture bottles   Culture   Final    NO GROWTH < 24 HOURS Performed at [facility], [place] [address], [place], [place] [address]    Report Status PENDING  Incomplete  Gastrointestinal  Panel by PCR , Stool     Status: None   Collection Time: 02/28/18  5:26 PM  Result Value Ref Range Status   Campylobacter species NOT DETECTED NOT DETECTED Final   Plesimonas shigelloides NOT DETECTED NOT DETECTED Final   Salmonella species NOT DETECTED NOT DETECTED Final   Yersinia enterocolitica NOT DETECTED NOT DETECTED Final   Vibrio species NOT DETECTED NOT DETECTED Final   Vibrio cholerae NOT DETECTED NOT DETECTED Final   Enteroaggregative E coli (EAEC) NOT DETECTED NOT DETECTED Final   Enteropathogenic E coli (EPEC) NOT DETECTED NOT DETECTED Final   Enterotoxigenic E coli (ETEC) NOT DETECTED NOT DETECTED Final   Shiga like toxin producing E coli (STEC) NOT DETECTED NOT DETECTED Final   Shigella/Enteroinvasive E coli (EIEC) NOT DETECTED NOT DETECTED Final   Cryptosporidium NOT DETECTED NOT DETECTED Final    Cyclospora cayetanensis NOT DETECTED NOT DETECTED Final   Entamoeba histolytica NOT DETECTED NOT DETECTED Final   Giardia lamblia NOT DETECTED NOT DETECTED Final   Adenovirus F40/41 NOT DETECTED NOT DETECTED Final   Astrovirus NOT DETECTED NOT DETECTED Final   Norovirus GI/GII NOT DETECTED NOT DETECTED Final   Rotavirus A NOT DETECTED NOT DETECTED Final   Sapovirus (I, II, IV, and V) NOT DETECTED NOT DETECTED Final    Comment: Performed at [facility], [address] [place], [place] [address]  C difficile quick scan w PCR reflex     Status: None   Collection Time: 02/28/18  5:26 PM  Result Value Ref Range Status   C Diff antigen NEGATIVE NEGATIVE Final   C Diff toxin NEGATIVE NEGATIVE Final   C Diff interpretation No C. difficile detected.  Final         Radiology Studies: Dg Chest Portable 1 View  Result Date: 02/28/2018 CLINICAL DATA:  Chest pain EXAM: PORTABLE CHEST 1 VIEW COMPARISON:  06/26/2017 FINDINGS: Stable cardiomegaly. Loop recording device projects over the left heart shadow. Mild aortic atherosclerosis without definite aneurysm. No acute pulmonary consolidations. Slight vascular congestion is noted without effusion or pneumothorax. Probable left basilar subsegmental atelectasis or slight pleural thickening. No acute osseous abnormality. IMPRESSION: Low lung volumes with stable cardiomegaly and mild aortic atherosclerosis. Mild central vascular congestion. Electronically Signed   By: Ashley Royalty M.D.   On: 02/28/2018 13:55        Scheduled Meds: . allopurinol  300 mg Oral Daily  . aspirin EC  81 mg Oral Daily  . atorvastatin  20 mg Oral Daily  . carbidopa-levodopa  2 tablet Oral QID  . colchicine  0.6 mg Oral Daily  . hydrocortisone  10 mg Oral QHS  . hydrocortisone  20 mg Oral Daily  . levothyroxine  224 mcg Oral QAC breakfast  . metoCLOPramide (REGLAN) injection  10 mg Intravenous Once   Followed by  . [START ON 03/02/2018] metoCLOPramide (REGLAN)  injection  10 mg Intravenous Once  . peg 3350 powder  0.5 kit Oral Once   And  . [START ON 03/02/2018] peg 3350 powder  0.5 kit Oral Once  . sertraline  100 mg Oral Daily  . topiramate  50 mg Oral BID   Continuous Infusions: . sodium chloride 125 mL/hr at 03/01/18 1500  . heparin 1,700 Units/hr (03/01/18 1500)     LOS: 0 days     Vernell Leep, MD, FACP, [facility]. [facility] Pager [phone]  If 7PM-7AM, please contact night-coverage [email or web address] Password [facility] 03/01/2018, 5:58 PM

## 2018-03-01 NOTE — Progress Notes (Signed)
Pt. States he can place cpap on himself. Pt. Requested his pressure setting to be lowered to 10 cmh20. RT assisted pt. With the setting. No issues at this time.

## 2018-03-01 NOTE — H&P (View-Only) (Signed)
          Daily Rounding Note  03/01/2018, 11:01 AM  LOS: 0 days   SUBJECTIVE:   Chief complaint: still having diarrhea.        OBJECTIVE:         Vital signs in last 24 hours:    Temp:  [97.7 F (36.5 C)-97.9 F (36.6 C)] 97.9 F (36.6 C) (08/22 0446) Pulse Rate:  [50-92] 69 (08/22 0446) Resp:  [10-21] 18 (08/22 0446) BP: (90-135)/(64-88) 109/64 (08/22 0446) SpO2:  [95 %-100 %] 100 % (08/22 0446) Weight:  [144.2 kg-144.7 kg] 144.6 kg (08/22 0446) Last BM Date: 02/28/18 Filed Weights   02/28/18 1237 02/28/18 1800 03/01/18 0446  Weight: (!) 144.2 kg (!) 144.7 kg (!) 144.6 kg   General: Obese, looks somewhat unhealthy but not acutely ill Heart: RRR. Chest: Clear bilaterally.  No labored breathing or cough. Abdomen: Soft.  Not tender or distended.  Bowel sounds active. Extremities: Pedal/ankle edema. Neuro/Psych: Oriented x3.  Fluid speech.  No gross deficits.  Intake/Output from previous day: 08/21 0701 - 08/22 0700 In: 2249.2 [P.O.:360; I.V.:1889.2] Out: 2 [Urine:1; Stool:1]  Intake/Output this shift: Total I/O In: 360 [P.O.:360] Out: -   Lab Results: Recent Labs    02/28/18 1150  WBC 5.7  HGB 15.2  HCT 47.8  PLT 151   BMET Recent Labs    02/28/18 1150 03/01/18 0747  NA 141 138  K 3.4* 3.4*  CL 108 108  CO2 27 23  GLUCOSE 112* 101*  BUN 10 10  CREATININE 1.34* 1.04  CALCIUM 8.9 8.2*   LFT Recent Labs    02/28/18 1150  PROT 6.4*  ALBUMIN 3.3*  AST 34  ALT 7  ALKPHOS 53  BILITOT 1.4*   PT/INR No results for input(s): LABPROT, INR in the last 72 hours. Hepatitis Panel No results for input(s): HEPBSAG, HCVAB, HEPAIGM, HEPBIGM in the last 72 hours.  Studies/Results: Dg Chest Portable 1 View  Result Date: 02/28/2018 CLINICAL DATA:  Chest pain EXAM: PORTABLE CHEST 1 VIEW COMPARISON:  06/26/2017 FINDINGS: Stable cardiomegaly. Loop recording device projects over the left heart shadow. Mild  aortic atherosclerosis without definite aneurysm. No acute pulmonary consolidations. Slight vascular congestion is noted without effusion or pneumothorax. Probable left basilar subsegmental atelectasis or slight pleural thickening. No acute osseous abnormality. IMPRESSION: Low lung volumes with stable cardiomegaly and mild aortic atherosclerosis. Mild central vascular congestion. Electronically Signed   By: Ashley Royalty M.D.   On: 02/28/2018 13:55    ASSESMENT:   *  3 weeks diarrhea C diff neg 8/13,   *  Recent gout flare and cellulitis late 12/2017.  On 1 x daily Colchicine.  Wonder if this is cause of diarrhea.    *  Mild AKI due to volume depletion from diarrhea.    *   Chronic Eliquis, on hold.  Last dose was 8/20 .  Heparin IV in place.    *  OSA on CPAP.     PLAN   *   Colonoscopy tomorrow morning.  Patient agreeable.  Will hold heparin a few hours before the procedure.    Azucena Freed  03/01/2018, 11:01 AM Phone [phone]

## 2018-03-02 ENCOUNTER — Encounter (HOSPITAL_COMMUNITY)
Admission: EM | Disposition: A | Discharge status: Home / Self Care | Payer: Self-pay | Source: Home / Self Care | Attending: Internal Medicine

## 2018-03-02 ENCOUNTER — Encounter (HOSPITAL_COMMUNITY): Payer: Self-pay

## 2018-03-02 DIAGNOSIS — K641 Second degree hemorrhoids: Secondary | ICD-10-CM | POA: Diagnosis not present

## 2018-03-02 DIAGNOSIS — K529 Noninfective gastroenteritis and colitis, unspecified: Secondary | ICD-10-CM

## 2018-03-02 DIAGNOSIS — K644 Residual hemorrhoidal skin tags: Secondary | ICD-10-CM | POA: Diagnosis not present

## 2018-03-02 DIAGNOSIS — D696 Thrombocytopenia, unspecified: Secondary | ICD-10-CM

## 2018-03-02 HISTORY — PX: COLONOSCOPY WITH PROPOFOL: SHX5780

## 2018-03-02 HISTORY — PX: BIOPSY: SHX5522

## 2018-03-02 LAB — CBC
HCT: 43.5 % (ref 39.0–52.0)
Hemoglobin: 13.9 g/dL (ref 13.0–17.0)
MCH: 27.4 pg (ref 26.0–34.0)
MCHC: 32 g/dL (ref 30.0–36.0)
MCV: 85.6 fL (ref 78.0–100.0)
Platelets: 136 10*3/uL — ABNORMAL LOW (ref 150–400)
RBC: 5.08 MIL/uL (ref 4.22–5.81)
RDW: 14.7 % (ref 11.5–15.5)
WBC: 6.1 10*3/uL (ref 4.0–10.5)

## 2018-03-02 LAB — BASIC METABOLIC PANEL
Anion gap: 6 (ref 5–15)
BUN: 8 mg/dL (ref 8–23)
CO2: 19 mmol/L — ABNORMAL LOW (ref 22–32)
Calcium: 8 mg/dL — ABNORMAL LOW (ref 8.9–10.3)
Chloride: 112 mmol/L — ABNORMAL HIGH (ref 98–111)
Creatinine, Ser: 0.98 mg/dL (ref 0.61–1.24)
GFR calc Af Amer: >60 mL/min (ref >60)
GFR calc non Af Amer: >60 mL/min (ref >60)
Glucose, Bld: 95 mg/dL (ref 70–99)
Potassium: 3.7 mmol/L (ref 3.5–5.1)
Sodium: 137 mmol/L (ref 135–145)

## 2018-03-02 LAB — APTT: aPTT: 79 seconds — ABNORMAL HIGH (ref 24–36)

## 2018-03-02 LAB — HEPARIN LEVEL (UNFRACTIONATED): Heparin Unfractionated: 0.45 IU/mL (ref 0.30–0.70)

## 2018-03-02 LAB — MAGNESIUM: Magnesium: 1.7 mg/dL (ref 1.7–2.4)

## 2018-03-02 SURGERY — COLONOSCOPY WITH PROPOFOL
Anesthesia: Monitor Anesthesia Care

## 2018-03-02 MED ORDER — MIDAZOLAM HCL 5 MG/ML IJ SOLN
INTRAMUSCULAR | Status: AC
  Filled 2018-03-02: qty 3

## 2018-03-02 MED ORDER — APIXABAN 5 MG PO TABS
5.0000 mg | ORAL_TABLET | Freq: Two times a day (BID) | ORAL | Status: DC
  Administered 2018-03-02 – 2018-03-03 (×2): 5 mg via ORAL
  Filled 2018-03-02 (×2): qty 1

## 2018-03-02 MED ORDER — LOPERAMIDE HCL 2 MG PO CAPS
2.0000 mg | ORAL_CAPSULE | ORAL | Status: DC | PRN
  Administered 2018-03-03: 2 mg via ORAL
  Filled 2018-03-02: qty 1

## 2018-03-02 MED ORDER — DIPHENHYDRAMINE HCL 50 MG/ML IJ SOLN
INTRAMUSCULAR | Status: DC | PRN
  Administered 2018-03-02 (×2): 25 mg via INTRAVENOUS

## 2018-03-02 MED ORDER — MIDAZOLAM HCL 10 MG/2ML IJ SOLN
INTRAMUSCULAR | Status: DC | PRN
  Administered 2018-03-02: 2 mg via INTRAVENOUS
  Administered 2018-03-02: 1 mg via INTRAVENOUS
  Administered 2018-03-02 (×2): 2 mg via INTRAVENOUS

## 2018-03-02 MED ORDER — FENTANYL CITRATE (PF) 100 MCG/2ML IJ SOLN
INTRAMUSCULAR | Status: DC | PRN
  Administered 2018-03-02 (×3): 25 ug via INTRAVENOUS

## 2018-03-02 MED ORDER — LACTATED RINGERS IV SOLN
INTRAVENOUS | Status: DC
  Administered 2018-03-02: 14:00:00 via INTRAVENOUS

## 2018-03-02 MED ORDER — DIPHENHYDRAMINE HCL 50 MG/ML IJ SOLN
INTRAMUSCULAR | Status: AC
  Filled 2018-03-02: qty 1

## 2018-03-02 MED ORDER — FENTANYL CITRATE (PF) 100 MCG/2ML IJ SOLN
INTRAMUSCULAR | Status: AC
  Filled 2018-03-02: qty 4

## 2018-03-02 SURGICAL SUPPLY — 22 items

## 2018-03-02 NOTE — Interval H&P Note (Signed)
History and Physical Interval Note:  03/02/2018 4:25 PM  Dennis Nations Dise Sr.  has presented today for surgery, with the diagnosis of diarrhea for 3 plus weeks  The various methods of treatment have been discussed with the patient and family. After consideration of risks, benefits and other options for treatment, the patient has consented to  Procedure(s): COLONOSCOPY WITH PROPOFOL (N/A) BIOPSY as a surgical intervention .  The patient's history has been reviewed, patient examined, no change in status, stable for surgery.  I have reviewed the patient's chart and labs.  Questions were answered to the patient's satisfaction.     [employer]

## 2018-03-02 NOTE — Progress Notes (Signed)
PROGRESS NOTE   Dennis Trueba Sr.  WLN:[id number]    DOB: [birth year]    DOA: 02/28/2018  PCP: [name], FNP   I have briefly reviewed patients previous medical records in [facility].  Brief Narrative:  72 year old male with PMH of HTN, HLD, hypothyroid, OSA on CPAP, CAD, NSTEMI, chronic diastolic CHF, Eliquis, Parkinson's disease, Addison's disease, stroke, testicular cancer presented to ED with history of chronic diarrhea of 3 weeks duration which reportedly started after contrasted [facility] at [facility] urology, worsened to a point of fecal incontinence.  Seen in ED 8/13: C. difficile negative, treated with Cipro and Flagyl for suspected colitis.  Last colonoscopy 8 years prior.  While in ED developed chest pain with radiation to left arm.  [place] [facility] consulted.  Status post colonoscopy and biopsies 8/23.  Assessment & Plan:   Principal Problem:   Diarrhea Active Problems:   Sleep apnea   Acquired hypothyroidism   Hypercholesterolemia   Essential hypertension   Addison disease ([place])   Primary Parkinsonism (HCC)   Paroxysmal atrial fibrillation (HCC)   Chest pain   Diarrhea: Unclear etiology.  DD: Post antibiotic diarrhea, microscopic colitis, drug reaction/colchicine for recent gout flare, infectious etiology felt less likely, collagenous colitis.  C. difficile testing twice recently negative.  GI panel negative.  Blood cultures negative to date.  Eliquis was held and patient was bridged with IV heparin prior to procedure.  He underwent colonoscopy 8/23 which showed nonthrombosed external hemorrhoids, ileitis, patchy moderate inflammation in the proximal transverse colon, at the hepatic flexure and the ascending colon and in the cecum which were biopsied.  As per my discussion with Dr. Louanna Raw, recommend scheduled Imodium at discharge, outpatient follow-up with pathology results.  Ruling out IBD versus other causes.  Acute kidney injury: Secondary to GI  losses and dehydration.  Resolved.  Dehydration: Secondary to GI losses.  Improved with IV fluids.  Encourage oral intake.  Hypokalemia: Secondary to GI losses.  Replaced.  Atypical chest pain: Experienced brief episode of chest pain in ED lasting approximately 30 seconds that resolved spontaneously without recurrence.  Troponins negative.  Cardiology input appreciated.  History of non-STEMI in the past for which she had cardiac catheterization that showed minimal CAD.  At this point cardiology do not think that he needs any additional work-up and have cleared him from cardiology standpoint to have GI procedures.  Paroxysmal A. fib: Controlled ventricular rate.  As stated above, Eliquis changed to IV heparin peri-colonoscopy. CHA2DS2VASc =6 (age, CHF, HTN, CVA, vascular disease).  Postprocedure will resume Eliquis and stop IV heparin.  History of multiple strokes: Anticoagulation as above.  OSA: Continue nightly CPAP.  Hypothyroid: Clinically euthyroid.  Continue Synthroid.  History of Addison's disease: Hemodynamically stable.  Continue hydrocortisone 20 mg daily and 10 mg at bedtime.  Parkinson's disease: Continue Sinemet.  History of gout: No acute flare.  Remains on allopurinol and colchicine.?  Consider stopping colchicine.  Hyperlipidemia: Continue statins.  Essential hypertension: Controlled.  Not on antihypertensives.  Has been on midodrine for intermittent hypotension.  Thrombocytopenia: Platelets have dropped from 151 on 8/21 to 136, likely related to IV heparin.  DC IV heparin.  Follow CBC in a.m.   DVT prophylaxis: Currently on IV heparin infusion, will be switched to home Eliquis.. Code Status: Full Family Communication: None at bedside Disposition: DC home pending stability of platelets (new thrombocytopenia) in a.m. and clinical stability.   Consultants:  [facility]  Procedures:  None  Antimicrobials:  None   Subjective: Patient was seen prior  to procedure.  Had multiple watery clear BMs with bowel prep.  No pain reported.  Eager to take orally so that he does not miss his parkinsonian medications.  ROS: As above, otherwise negative.  Objective:  Vitals:   03/02/18 1545 03/02/18 1555 03/02/18 1605 03/02/18 1640  BP: (!) 109/47 (!) 101/54 (!) 103/58   Pulse: 73 67 71   Resp: (!) 23 (!) 21 15   Temp:    98 F (36.7 C)  TempSrc:    Oral  SpO2: 100% 98% 98%   Weight:      Height:        Examination: No significant change in clinical exam compared to yesterday except mild parkinsonian tremors of his hands.  General exam: Does not elderly male, moderately built and obese lying comfortably supine in bed.  Oral mucosa moist. Respiratory system: Clear to auscultation. Respiratory effort normal. Cardiovascular system: S1 & S2 heard, irregularly irregular. No JVD, murmurs, rubs, gallops or clicks. No pedal edema. Gastrointestinal system: Abdomen is nondistended, soft and nontender. No organomegaly or masses felt. Normal bowel sounds heard. Central nervous system: Alert and oriented. No focal neurological deficits. Extremities: Symmetric 5 x 5 power. Skin: No rashes, lesions or ulcers Psychiatry: Judgement and insight appear normal. Mood & affect appropriate.     Data Reviewed: I have personally reviewed following labs and imaging studies  CBC: Recent Labs  Lab 02/28/18 1150 03/02/18 0203  WBC 5.7 6.1  NEUTROABS 4.1  --   HGB 15.2 13.9  HCT 47.8 43.5  MCV 85.2 85.6  PLT 151 803*   Basic Metabolic Panel: Recent Labs  Lab 02/28/18 1150 03/01/18 0747 03/02/18 0203  NA 141 138 137  K 3.4* 3.4* 3.7  CL 108 108 112*  CO2 27 23 19*  GLUCOSE 112* 101* 95  BUN 10 10 8   CREATININE 1.34* 1.04 0.98  CALCIUM 8.9 8.2* 8.0*  MG  --   --  1.7   Liver Function Tests: Recent Labs  Lab 02/28/18 1150  AST 34  ALT 7  ALKPHOS 53  BILITOT 1.4*  PROT 6.4*  ALBUMIN 3.3*   Cardiac Enzymes: Recent Labs  Lab  02/28/18 1535 02/28/18 2040 03/01/18 0229  TROPONINI <0.03 <0.03 <0.03     Recent Results (from the past 240 hour(s))  Culture, blood (Routine x 2)     Status: None (Preliminary result)   Collection Time: 02/28/18 12:45 PM  Result Value Ref Range Status   Specimen Description BLOOD RIGHT ANTECUBITAL  Final   Special Requests   Final    BOTTLES DRAWN AEROBIC AND ANAEROBIC Blood Culture adequate volume   Culture   Final    NO GROWTH 2 DAYS Performed at [facility], [place] [address], [place], [place] [address]    Report Status PENDING  Incomplete  Culture, blood (Routine x 2)     Status: None (Preliminary result)   Collection Time: 02/28/18 12:47 PM  Result Value Ref Range Status   Specimen Description BLOOD RIGHT ARM  Final   Special Requests   Final    BOTTLES DRAWN AEROBIC AND ANAEROBIC Blood Culture results may not be optimal due to an inadequate volume of blood received in culture bottles   Culture   Final    NO GROWTH 2 DAYS Performed at [facility], [place] [address], [place], [place] [address]    Report Status PENDING  Incomplete  Gastrointestinal  Panel by PCR , Stool     Status: None   Collection Time: 02/28/18  5:26 PM  Result Value Ref Range Status   Campylobacter species NOT DETECTED NOT DETECTED Final   Plesimonas shigelloides NOT DETECTED NOT DETECTED Final   Salmonella species NOT DETECTED NOT DETECTED Final   Yersinia enterocolitica NOT DETECTED NOT DETECTED Final   Vibrio species NOT DETECTED NOT DETECTED Final   Vibrio cholerae NOT DETECTED NOT DETECTED Final   Enteroaggregative E coli (EAEC) NOT DETECTED NOT DETECTED Final   Enteropathogenic E coli (EPEC) NOT DETECTED NOT DETECTED Final   Enterotoxigenic E coli (ETEC) NOT DETECTED NOT DETECTED Final   Shiga like toxin producing E coli (STEC) NOT DETECTED NOT DETECTED Final   Shigella/Enteroinvasive E coli (EIEC) NOT DETECTED NOT DETECTED Final   Cryptosporidium NOT DETECTED NOT DETECTED Final    Cyclospora cayetanensis NOT DETECTED NOT DETECTED Final   Entamoeba histolytica NOT DETECTED NOT DETECTED Final   Giardia lamblia NOT DETECTED NOT DETECTED Final   Adenovirus F40/41 NOT DETECTED NOT DETECTED Final   Astrovirus NOT DETECTED NOT DETECTED Final   Norovirus GI/GII NOT DETECTED NOT DETECTED Final   Rotavirus A NOT DETECTED NOT DETECTED Final   Sapovirus (I, II, IV, and V) NOT DETECTED NOT DETECTED Final    Comment: Performed at [facility], [address] [place], [place] [address]  C difficile quick scan w PCR reflex     Status: None   Collection Time: 02/28/18  5:26 PM  Result Value Ref Range Status   C Diff antigen NEGATIVE NEGATIVE Final   C Diff toxin NEGATIVE NEGATIVE Final   C Diff interpretation No C. difficile detected.  Final         Radiology Studies: No results found.      Scheduled Meds: . allopurinol  300 mg Oral Daily  . aspirin EC  81 mg Oral Daily  . atorvastatin  20 mg Oral Daily  . carbidopa-levodopa  2 tablet Oral QID  . colchicine  0.6 mg Oral Daily  . hydrocortisone  10 mg Oral QHS  . hydrocortisone  20 mg Oral Daily  . levothyroxine  224 mcg Oral QAC breakfast  . metoCLOPramide (REGLAN) injection  10 mg Intravenous Once  . sertraline  100 mg Oral Daily  . topiramate  50 mg Oral BID   Continuous Infusions: . sodium chloride 125 mL/hr at 03/02/18 0000     LOS: 1 day     Vernell Leep, MD, [place], [facility]. [facility] Pager [phone]  If 7PM-7AM, please contact night-coverage [email or web address] Password [facility] 03/02/2018, 6:14 PM

## 2018-03-02 NOTE — Progress Notes (Signed)
ANTICOAGULATION CONSULT NOTE - [place] for heparin Indication: atrial fibrillation  Allergies  Allergen Reactions  . Bee Venom Anaphylaxis  . Shrimp [Shellfish Allergy] Anaphylaxis and Other (See Comments)    "just shrimp"  . Stadol [Butorphanol] Anaphylaxis and Other (See Comments)    respiratory  Distress, couldn't breathe, cardiac arrest  . Wasp Venom Anaphylaxis    Patient Measurements: Height: 6\' 5"  (195.6 cm) Weight: (!) 318 lb 11.2 oz (144.6 kg) IBW/kg (Calculated) : 89.1 Heparin Dosing Weight: 121.4 kg  Vital Signs: Temp: 98 F (36.7 C) (08/23 1640) Temp Source: Oral (08/23 1640) BP: 103/58 (08/23 1605) Pulse Rate: 71 (08/23 1605)  Labs: Recent Labs    02/28/18 1150 02/28/18 1535 02/28/18 2040 03/01/18 0229 03/01/18 0747 03/01/18 1843 03/02/18 0203  HGB 15.2  --   --   --   --   --  13.9  HCT 47.8  --   --   --   --   --  43.5  PLT 151  --   --   --   --   --  136*  APTT  --   --   --   --  35 95* 79*  HEPARINUNFRC  --   --   --   --  0.58 0.70 0.45  CREATININE 1.34*  --   --   --  1.04  --  0.98  TROPONINI  --  <0.03 <0.03 <0.03  --   --   --     Estimated Creatinine Clearance: 107.3 mL/min (by C-G formula based on SCr of 0.98 mg/dL).   Medical History: Past Medical History:  Diagnosis Date  . Addison's disease ([place]) 01/2016  . Arthritis    low back - DDD  . Cellulitis, scrotum 08/02/2014  . Chronic diastolic CHF (congestive heart failure) ([place])    Echo 12/18: severe LVH, EF 60-65, Gr 1 DD // Echo 5/18: EF 65-70, Gr 1 DD  . Chronic lower back pain    "from [place] 2007"  . Complication of anesthesia    Sometimes has N&V /w anesth.   . Coronary artery disease    NSTEMI >> LHC 9/01: prox and mid LAD 50-70; mRCA 40 >> med Rx // Nuc 8/13 [facility]):  no infarct or ischemia, EF 59  . Elevated PSA   . Epididymitis, left 08/04/2014  . History of chronic bronchitis   . History of gout   . History of stroke 01/22/2016   2004 -  "right brain stem; no residual " // 2017  . Hypertension   . Hypocholesteremia   . Hypothyroidism   . Infection of skin due to methicillin resistant Staphylococcus aureus (MRSA) 12/28/2017  . Kidney stone   . Myocardial infarction [facility]) 2001   2001- cardiac cath., cardiac clearanece note dr Otho Perl 05-14-13 on chart, stress test results 02-21-12 on chart  . OSA on CPAP    cpap setting of 10  . Parkinson's disease ([place])    stage 4   . Pneumonia 2000's and 2013  . PONV (postoperative nausea and vomiting)   . [facility] spotted fever   . Testicular cancer ([place]) 2015    Medications:  Medications Prior to Admission  Medication Sig Dispense Refill Last Dose  . allopurinol (ZYLOPRIM) 300 MG tablet Take 1 tablet (300 mg total) by mouth daily. 30 tablet 11 02/28/2018 at Unknown time  . apixaban (ELIQUIS) 5 MG TABS tablet Take 5 mg by mouth 2 (two) times daily.  02/28/2018 at 0800  . atorvastatin (LIPITOR) 20 MG tablet TAKE 1 TABLET(20 MG) BY MOUTH DAILY (Patient taking differently: Take 20 mg by mouth daily. ) 30 tablet 11 02/28/2018 at Unknown time  . carbidopa-levodopa (SINEMET IR) 25-250 MG tablet Take 2 tablets by mouth 4 (four) times daily.   02/28/2018 at Unknown time  . Cholecalciferol (VITAMIN D PO) Take 500 Units by mouth daily.   02/28/2018 at Unknown time  . colchicine 0.6 MG tablet Take 1 tablet (0.6 mg total) by mouth 2 (two) times daily. (Patient taking differently: Take 0.6 mg by mouth daily. ) 10 tablet 0 02/28/2018 at Unknown time  . EPINEPHrine 0.3 mg/0.3 mL IJ SOAJ injection Inject 0.3 mLs (0.3 mg total) into the muscle as needed (allergic reaction). 1 Device 12 prn  . gatifloxacin (ZYMAXID) 0.5 % SOLN Place 1 drop into the left eye 3 (three) times daily.  0 02/28/2018 at Unknown time  . hydrocortisone (CORTEF) 10 MG tablet 2 tablets in the morning and 1 tablet at 5 PM. Please start taking on 06/18/17 (Patient taking differently: Take 10-20 mg by mouth See admin instructions. 2 tablets  in the morning and 1 tablet at 5 PM. Please start taking on 06/18/17) 90 tablet 0 02/28/2018 at Unknown time  . levothyroxine (SYNTHROID, LEVOTHROID) 112 MCG tablet TAKE 2 TABLETS(224 MCG) BY MOUTH DAILY BEFORE BREAKFAST (Patient taking differently: Take 224 mcg by mouth daily before breakfast. ) 180 tablet 0 02/28/2018 at Unknown time  . midodrine (PROAMATINE) 5 MG tablet TAKE 1 TABLET(5 MG) BY MOUTH TWICE DAILY WITH A MEAL (Patient taking differently: Take 5 mg by mouth daily as needed (if blood pressure drop <90/40). ) 180 tablet 1 never  . Multiple Vitamin (MULTIVITAMIN WITH MINERALS) TABS tablet Take 1 tablet by mouth daily. 30 tablet 0 02/28/2018 at Unknown time  . sertraline (ZOLOFT) 100 MG tablet Take 100 mg by mouth daily.   02/28/2018 at Unknown time  . testosterone cypionate (DEPO-TESTOSTERONE) 200 MG/ML injection Inject 0.75 mLs (150 mg total) into the muscle every 14 (fourteen) days. 10 mL 5 02/28/2018 at Unknown time  . topiramate (TOPAMAX) 50 MG tablet Take 1 tablet (50 mg total) by mouth 2 (two) times daily. 180 tablet 3 02/28/2018 at Unknown time    Assessment: 72 yo man on eliquis PTA for afib, h/o CVA to start heparin bridge for planned colonoscopy. Last dose eliquis unknown as of now, did not receive any doses here.  Pt had IV heparin stopped this morning prior to colonoscopy. Per GI and attending, okay to resume Eliquis tonight.  Goal of Therapy:  Heparin level 0.3-.07 units/ml Monitor platelets by anticoagulation protocol: Yes   Plan:  Resume Eliquis 5mg  BID Pharmacy will sign off, reconsult as needed  Arrie Senate, PharmD, BCPS Clinical Pharmacist [phone] Please check AMION for all [place] numbers 03/02/2018

## 2018-03-02 NOTE — Op Note (Signed)
[facility] Patient Name: Dennis [name] Procedure Date : 03/02/2018 MRN: [id number] Attending MD: Justice Britain , MD Date of Birth: 10-30-1945 CSN: [id number] Age: 72 Admit Type: Inpatient Procedure:                Colonoscopy Indications:              Clinically significant diarrhea of unexplained                            origin, Abnormal CT of the GI tract Providers:                Justice Britain, MD, Kingsley Plan, RN,                            Alan Mulder, Technician Referring MD:             Azucena Freed PA, PA Medicines:                Diphenhydramine 50 mg IV, Midazolam 7 mg IV,                            Fentanyl 75 micrograms IV Complications:            No immediate complications. Estimated Blood Loss:     Estimated blood loss: none. Procedure:                Pre-Anesthesia Assessment:                           - Prior to the procedure, a History and Physical                            was performed, and patient medications and                            allergies were reviewed. The patient's tolerance of                            previous anesthesia was also reviewed. The risks                            and benefits of the procedure and the sedation                            options and risks were discussed with the patient.                            All questions were answered, and informed consent                            was obtained. Prior Anticoagulants: The patient has                            taken heparin, last dose was day of procedure. ASA  Grade Assessment: III - A patient with severe                            systemic disease. After reviewing the risks and                            benefits, the patient was deemed in satisfactory                            condition to undergo the procedure.                           After obtaining informed consent, the colonoscope                            was  passed under direct vision. Throughout the                            procedure, the patient's blood pressure, pulse, and                            oxygen saturations were monitored continuously. The                            CF-HQ190L ([id number] Olympus adult colon was                            introduced through the anus and advanced to the 8                            cm into the ileum. The colonoscopy was performed                            without difficulty. The patient tolerated the                            procedure. The quality of the bowel preparation was                            fair. Scope In: 3:15:00 PM Scope Out: 3:44:54 PM Scope Withdrawal Time: 0 hours 25 minutes 16 seconds  Total Procedure Duration: 0 hours 29 minutes 54 seconds  Findings:      The digital rectal exam findings include non-thrombosed external       hemorrhoids. Pertinent negatives include no palpable rectal lesions.      Inflammation, moderate in severity and characterized by congestion       (edema), erosions and friability was found in the terminal ileum and in       the ileocecal valve. Biopsies were taken with a cold forceps for       histology.      Patchy moderate inflammation characterized by congestion (edema),       erythema, friability, granularity and shallow ulcerations was found in       the proximal transverse colon, at the hepatic flexure, in the ascending  colon and in the cecum. Biopsies were taken with a cold forceps for       histology.      Normal mucosa was found in the recto-sigmoid colon, in the sigmoid colon       and in the descending colon. Biopsies were taken with a cold forceps for       histology.      Normal mucosa was found in the rectum. Biopsies were taken with a cold       forceps for histology.      Non-bleeding non-thrombosed external and internal hemorrhoids were found       during retroflexion, during perianal exam and during digital exam. The        hemorrhoids were Grade II (internal hemorrhoids that prolapse but reduce       spontaneously). Impression:               - Preparation of the colon was fair.                           - Non-thrombosed external hemorrhoids found on                            digital rectal exam.                           - Ileitis. Biopsied.                           - Patchy moderate inflammation was found in the                            proximal transverse colon, at the hepatic flexure,                            in the ascending colon and in the cecum. Biopsied.                           - Normal mucosa in the recto-sigmoid colon, in the                            sigmoid colon and in the descending colon. Biopsied.                           - Normal mucosa in the rectum. Biopsied.                           - Non-bleeding non-thrombosed external and internal                            hemorrhoids. Moderate Sedation:      Moderate (conscious) sedation was administered by the endoscopy nurse       and supervised by the endoscopist. The patient's oxygen saturation,       heart rate, blood pressure and response to care were monitored. Total       physician intraservice time was 30 minutes. Recommendation:           - Return patient to hospital ward  for ongoing care.                           - Await pathology results.                           - Minimize all NSAIDs as able other than Aspirin if                            required.                           - While awaiting biopsies to help understand                            etiology of his issues and whether chronicity is                            playing a role, would be reasonable to start                            Immodium. Would take 4 mg in the AM with first                            diarrheal movement and then 2 mg every 4 hours if                            necessary - as a means of trying slow bowels.                           - We will  plan on patient being seen in follow up                            in clinic to discuss results as necessary and                            determine further workup based on his overall                            clinical symptomatology as well as treatment if a                            chronicity is noted on pathology.                           - The findings and recommendations were discussed                            with the patient.                           - The findings and recommendations were discussed  with the referring physician. Procedure Code(s):        --- Professional ---                           [phone], Colonoscopy, flexible; with biopsy, single                            or multiple                           G0500, Moderate sedation services provided by the                            same physician or other qualified health care                            professional performing a gastrointestinal                            endoscopic service that sedation supports,                            requiring the presence of an independent trained                            observer to assist in the monitoring of the                            patient's level of consciousness and physiological                            status; initial 15 minutes of intra-service time;                            patient age 1 years or older (additional time may                            be reported with [phone], as appropriate)                           [phone], Moderate sedation services provided by the                            same physician or other qualified health care                            professional performing the diagnostic or                            therapeutic service that the sedation supports,                            requiring the presence of an independent trained  observer to assist in the monitoring of the                             patient's level of consciousness and physiological                            status; each additional 15 minutes intraservice                            time (List separately in addition to code for                            primary service) Diagnosis Code(s):        --- Professional ---                           K64.1, Second degree hemorrhoids                           K64.4, Residual hemorrhoidal skin tags                           K52.9, Noninfective gastroenteritis and colitis,                            unspecified                           R19.7, Diarrhea, unspecified                           R93.3, Abnormal findings on diagnostic imaging of                            other parts of digestive tract CPT copyright 2017 [employer]. All rights reserved. The codes documented in this report are preliminary and upon coder review may  be revised to meet current compliance requirements. Justice Britain, MD 03/02/2018 4:57:04 PM Number of Addenda: 0

## 2018-03-02 NOTE — Progress Notes (Signed)
ANTICOAGULATION CONSULT NOTE - [place] for heparin Indication: atrial fibrillation  Allergies  Allergen Reactions  . Bee Venom Anaphylaxis  . Shrimp [Shellfish Allergy] Anaphylaxis and Other (See Comments)    "just shrimp"  . Stadol [Butorphanol] Anaphylaxis and Other (See Comments)    respiratory  Distress, couldn't breathe, cardiac arrest  . Wasp Venom Anaphylaxis    Patient Measurements: Height: 6\' 5"  (195.6 cm) Weight: (!) 318 lb 11.2 oz (144.6 kg) IBW/kg (Calculated) : 89.1 Heparin Dosing Weight: 121.4 kg  Vital Signs: Temp: 98 F (36.7 C) (08/22 2002) Temp Source: Oral (08/22 2002) BP: 132/101 (08/22 2002) Pulse Rate: 70 (08/22 2002)  Labs: Recent Labs    02/28/18 1150 02/28/18 1535 02/28/18 2040 03/01/18 0229 03/01/18 0747 03/01/18 1843 03/02/18 0203  HGB 15.2  --   --   --   --   --  13.9  HCT 47.8  --   --   --   --   --  43.5  PLT 151  --   --   --   --   --  136*  APTT  --   --   --   --  35 95* 79*  HEPARINUNFRC  --   --   --   --  0.58 0.70 0.45  CREATININE 1.34*  --   --   --  1.04  --   --   TROPONINI  --  <0.03 <0.03 <0.03  --   --   --     Estimated Creatinine Clearance: 101.1 mL/min (by C-G formula based on SCr of 1.04 mg/dL).   Medical History: Past Medical History:  Diagnosis Date  . Addison's disease ([place]) 01/2016  . Arthritis    low back - DDD  . Cellulitis, scrotum 08/02/2014  . Chronic diastolic CHF (congestive heart failure) ([place])    Echo 12/18: severe LVH, EF 60-65, Gr 1 DD // Echo 5/18: EF 65-70, Gr 1 DD  . Chronic lower back pain    "from [place] 2007"  . Complication of anesthesia    Sometimes has N&V /w anesth.   . Coronary artery disease    NSTEMI >> LHC 9/01: prox and mid LAD 50-70; mRCA 40 >> med Rx // Nuc 8/13 [facility]):  no infarct or ischemia, EF 59  . Elevated PSA   . Epididymitis, left 08/04/2014  . History of chronic bronchitis   . History of gout   . History of stroke 01/22/2016   2004 -  "right brain stem; no residual " // 2017  . Hypertension   . Hypocholesteremia   . Hypothyroidism   . Infection of skin due to methicillin resistant Staphylococcus aureus (MRSA) 12/28/2017  . Kidney stone   . Myocardial infarction [facility]) 2001   2001- cardiac cath., cardiac clearanece note dr Otho Perl 05-14-13 on chart, stress test results 02-21-12 on chart  . OSA on CPAP    cpap setting of 10  . Parkinson's disease ([place])    stage 4   . Pneumonia 2000's and 2013  . PONV (postoperative nausea and vomiting)   . [facility] spotted fever   . Testicular cancer ([place]) 2015    Medications:  Medications Prior to Admission  Medication Sig Dispense Refill Last Dose  . allopurinol (ZYLOPRIM) 300 MG tablet Take 1 tablet (300 mg total) by mouth daily. 30 tablet 11 02/28/2018 at Unknown time  . apixaban (ELIQUIS) 5 MG TABS tablet Take 5 mg by mouth 2 (two) times  daily.   02/28/2018 at 0800  . atorvastatin (LIPITOR) 20 MG tablet TAKE 1 TABLET(20 MG) BY MOUTH DAILY (Patient taking differently: Take 20 mg by mouth daily. ) 30 tablet 11 02/28/2018 at Unknown time  . carbidopa-levodopa (SINEMET IR) 25-250 MG tablet Take 2 tablets by mouth 4 (four) times daily.   02/28/2018 at Unknown time  . Cholecalciferol (VITAMIN D PO) Take 500 Units by mouth daily.   02/28/2018 at Unknown time  . colchicine 0.6 MG tablet Take 1 tablet (0.6 mg total) by mouth 2 (two) times daily. (Patient taking differently: Take 0.6 mg by mouth daily. ) 10 tablet 0 02/28/2018 at Unknown time  . EPINEPHrine 0.3 mg/0.3 mL IJ SOAJ injection Inject 0.3 mLs (0.3 mg total) into the muscle as needed (allergic reaction). 1 Device 12 prn  . gatifloxacin (ZYMAXID) 0.5 % SOLN Place 1 drop into the left eye 3 (three) times daily.  0 02/28/2018 at Unknown time  . hydrocortisone (CORTEF) 10 MG tablet 2 tablets in the morning and 1 tablet at 5 PM. Please start taking on 06/18/17 (Patient taking differently: Take 10-20 mg by mouth See admin instructions. 2 tablets  in the morning and 1 tablet at 5 PM. Please start taking on 06/18/17) 90 tablet 0 02/28/2018 at Unknown time  . levothyroxine (SYNTHROID, LEVOTHROID) 112 MCG tablet TAKE 2 TABLETS(224 MCG) BY MOUTH DAILY BEFORE BREAKFAST (Patient taking differently: Take 224 mcg by mouth daily before breakfast. ) 180 tablet 0 02/28/2018 at Unknown time  . midodrine (PROAMATINE) 5 MG tablet TAKE 1 TABLET(5 MG) BY MOUTH TWICE DAILY WITH A MEAL (Patient taking differently: Take 5 mg by mouth daily as needed (if blood pressure drop <90/40). ) 180 tablet 1 never  . Multiple Vitamin (MULTIVITAMIN WITH MINERALS) TABS tablet Take 1 tablet by mouth daily. 30 tablet 0 02/28/2018 at Unknown time  . sertraline (ZOLOFT) 100 MG tablet Take 100 mg by mouth daily.   02/28/2018 at Unknown time  . testosterone cypionate (DEPO-TESTOSTERONE) 200 MG/ML injection Inject 0.75 mLs (150 mg total) into the muscle every 14 (fourteen) days. 10 mL 5 02/28/2018 at Unknown time  . topiramate (TOPAMAX) 50 MG tablet Take 1 tablet (50 mg total) by mouth 2 (two) times daily. 180 tablet 3 02/28/2018 at Unknown time    Assessment: 72 yo man on eliquis PTA for afib, h/o CVA to start heparin bridge for planned colonoscopy. Last dose eliquis unknown as of now, did not receive any doses here.  Repeat HL and aPTT are therapeutic and seem to correlate. Will continue only monitoring heparin levels moving forward.   Goal of Therapy:  Heparin level 0.3-.07 units/ml Monitor platelets by anticoagulation protocol: Yes   Plan:  Continue IV heparin 1700 units/hr Monitor daily HL, CBC and s/s of bleeding   Albertina Parr, PharmD., BCPS Clinical Pharmacist Clinical phone for 03/02/18: [phone]

## 2018-03-02 NOTE — Progress Notes (Signed)
Pt c/o lethargy and drowsiness.  Stated not feeling all right.  Administered Reglan as scheduled, and he stated it helped a lot.   VSS.  Idolina Primer, RN

## 2018-03-02 NOTE — Progress Notes (Signed)
Placed patient on CPAP for the night with pressure set at 10cm.   

## 2018-03-03 LAB — SEDIMENTATION RATE: Sed Rate: 6 mm/hr (ref 0–16)

## 2018-03-03 LAB — C-REACTIVE PROTEIN: CRP: 1.2 mg/dL — ABNORMAL HIGH (ref <1.0)

## 2018-03-03 LAB — CBC
HCT: 41.4 % (ref 39.0–52.0)
Hemoglobin: 13.1 g/dL (ref 13.0–17.0)
MCH: 27.3 pg (ref 26.0–34.0)
MCHC: 31.6 g/dL (ref 30.0–36.0)
MCV: 86.3 fL (ref 78.0–100.0)
Platelets: 135 10*3/uL — ABNORMAL LOW (ref 150–400)
RBC: 4.8 MIL/uL (ref 4.22–5.81)
RDW: 15.1 % (ref 11.5–15.5)
WBC: 4.4 10*3/uL (ref 4.0–10.5)

## 2018-03-03 LAB — BASIC METABOLIC PANEL
Anion gap: 6 (ref 5–15)
BUN: 7 mg/dL — ABNORMAL LOW (ref 8–23)
CO2: 22 mmol/L (ref 22–32)
Calcium: 8.4 mg/dL — ABNORMAL LOW (ref 8.9–10.3)
Chloride: 111 mmol/L (ref 98–111)
Creatinine, Ser: 1.2 mg/dL (ref 0.61–1.24)
GFR calc Af Amer: >60 mL/min (ref >60)
GFR calc non Af Amer: 59 mL/min — ABNORMAL LOW (ref >60)
Glucose, Bld: 130 mg/dL — ABNORMAL HIGH (ref 70–99)
Potassium: 3.5 mmol/L (ref 3.5–5.1)
Sodium: 139 mmol/L (ref 135–145)

## 2018-03-03 MED ORDER — COLCHICINE 0.6 MG PO TABS: 0.6000 mg | ORAL_TABLET | Freq: Every day | ORAL | Status: DC

## 2018-03-03 MED ORDER — MIDODRINE HCL 5 MG PO TABS: 5.0000 mg | ORAL_TABLET | Freq: Every day | ORAL | Status: DC | PRN

## 2018-03-03 MED ORDER — LOPERAMIDE HCL 2 MG PO CAPS: 2.0000 mg | ORAL_CAPSULE | ORAL | 0 refills | Status: DC | PRN

## 2018-03-03 NOTE — Discharge Summary (Signed)
Physician Discharge Summary  Dennis Bearman Ruggirello Sr. KDT:[id number] DOB: April 11, 1946  PCP: [name], FNP  Admit date: 02/28/2018 Discharge date: 03/03/2018  Recommendations for Outpatient Follow-up:  1. Clarene Reamer, FNP/PCP in 5 days with repeat labs (CBC & BMP). 2. Dr. Justice Britain, [facility] follow-up with results of colonoscopy biopsy results. 3. Dr. Lauree Chandler, Cardiology  Home Health: None Equipment/Devices: None  Discharge Condition: Improved and stable CODE STATUS: Full Diet recommendation: Heart healthy diet.  Discharge Diagnoses:  Principal Problem:   Diarrhea Active Problems:   Sleep apnea   Acquired hypothyroidism   Hypercholesterolemia   Essential hypertension   Addison disease ([place])   Primary Parkinsonism (HCC)   Paroxysmal atrial fibrillation (HCC)   Chest pain   Brief Summary: 72 year old male with PMH of HTN, HLD, hypothyroid, OSA on CPAP, CAD, NSTEMI, chronic diastolic CHF, Eliquis, Parkinson's disease, Addison's disease, stroke, testicular cancer presented to ED with history of chronic diarrhea of 3 weeks duration which reportedly started after contrasted CT at [facility] urology, worsened to a point of fecal incontinence.  Seen in ED 8/13: C. difficile negative, treated with Cipro and Flagyl for suspected colitis.  Last colonoscopy 8 years prior.  While in ED developed chest pain with radiation to left arm.  [facility] and cardiology consulted.  Status post colonoscopy and biopsies 8/23.  Assessment & Plan:   Diarrhea: Unclear etiology.  DD: Post antibiotic diarrhea, microscopic colitis, drug reaction/colchicine, infectious etiology felt less likely, collagenous colitis.  C. difficile testing twice recently negative.  GI panel negative.  Blood cultures negative to date.  Eliquis was held and patient was bridged with IV heparin prior to procedure.  He underwent colonoscopy 8/23 which showed nonthrombosed external hemorrhoids, ileitis,  patchy moderate inflammation in the proximal transverse colon, at the hepatic flexure and the ascending colon and in the cecum which were biopsied.    As per [facility]/Dr. Louanna Raw, recommend scheduled Imodium at discharge, outpatient follow-up with pathology results and cleared for discharge home.  Ruling out IBD versus other causes.  Acute kidney injury: Secondary to GI losses and dehydration.  Resolved.  Dehydration: Secondary to GI losses.    Resolved with IV fluids.  Hypokalemia: Secondary to GI losses.  Replaced.  Atypical chest pain: Experienced brief episode of chest pain in ED lasting approximately 30 seconds that resolved spontaneously without recurrence.  Troponins negative.  Cardiology input appreciated.  History of non-STEMI in the past for which he had cardiac catheterization that showed minimal CAD.  At this point cardiology do not think that he needs any additional work-up and cleared him from cardiology standpoint to have GI procedures.  Paroxysmal A. fib: Controlled ventricular rate.  CHA2DS2VASc =6 (age, CHF, HTN, CVA, vascular disease).    Eliquis was temporarily held and he was bridged with IV heparin prior to colonoscopy.  Post colonoscopy, Eliquis has been resumed.  History of multiple strokes: Anticoagulation as above.  OSA: Continue nightly CPAP.  Hypothyroid: Clinically euthyroid.  Continue Synthroid.  History of Addison's disease: Hemodynamically stable.  Continue hydrocortisone 20 mg daily and 10 mg at bedtime.  Parkinson's disease: Continue Sinemet.  History of gout: No acute flare.  Remains on allopurinol and colchicine.?  Consider stopping colchicine.  Hyperlipidemia: Continue statins.  Essential hypertension: Controlled.  Not on antihypertensives.  Has been on PRN midodrine for intermittent hypotension.  Thrombocytopenia: Platelets have dropped from 151 on 8/21 to 136, likely related to IV heparin.    Platelets are stable as yesterday at  135.  Follow CBC next week.  No bleeding reported.   Consultants:  [facility]  Procedures:  Colonoscopy 8/23   Discharge Instructions  Discharge Instructions    Call MD for:   Complete by:  As directed    Worsening diarrhea.   Call MD for:  extreme fatigue   Complete by:  As directed    Call MD for:  persistant dizziness or light-headedness   Complete by:  As directed    Call MD for:  persistant nausea and vomiting   Complete by:  As directed    Call MD for:  severe uncontrolled pain   Complete by:  As directed    Call MD for:  temperature >100.4   Complete by:  As directed    Diet - low sodium heart healthy   Complete by:  As directed    Increase activity slowly   Complete by:  As directed        Medication List    TAKE these medications   allopurinol 300 MG tablet Commonly known as:  ZYLOPRIM Take 1 tablet (300 mg total) by mouth daily.   atorvastatin 20 MG tablet Commonly known as:  LIPITOR TAKE 1 TABLET(20 MG) BY MOUTH DAILY What changed:  See the new instructions.   carbidopa-levodopa 25-250 MG tablet Commonly known as:  SINEMET IR Take 2 tablets by mouth 4 (four) times daily.   colchicine 0.6 MG tablet Take 1 tablet (0.6 mg total) by mouth daily.   ELIQUIS 5 MG Tabs tablet Generic drug:  apixaban Take 5 mg by mouth 2 (two) times daily.   EPINEPHrine 0.3 mg/0.3 mL Soaj injection Commonly known as:  EPI-PEN Inject 0.3 mLs (0.3 mg total) into the muscle as needed (allergic reaction).   gatifloxacin 0.5 % Soln Commonly known as:  ZYMAXID Place 1 drop into the left eye 3 (three) times daily.   hydrocortisone 10 MG tablet Commonly known as:  CORTEF 2 tablets in the morning and 1 tablet at 5 PM. Please start taking on 06/18/17 What changed:    how much to take  how to take this  when to take this   levothyroxine 112 MCG tablet Commonly known as:  SYNTHROID, LEVOTHROID TAKE 2 TABLETS(224 MCG) BY MOUTH DAILY BEFORE BREAKFAST What  changed:  See the new instructions.   loperamide 2 MG capsule Commonly known as:  IMODIUM Take 1 capsule (2 mg total) by mouth as needed for diarrhea or loose stools (Would take 4 mg in the morning with first diarrheal movement and then 2 mg every 6 hours if needed. Max: 16 mg/day.).   midodrine 5 MG tablet Commonly known as:  PROAMATINE Take 1 tablet (5 mg total) by mouth daily as needed (if blood pressure drop <90/40). What changed:  See the new instructions.   multivitamin with minerals Tabs tablet Take 1 tablet by mouth daily.   sertraline 100 MG tablet Commonly known as:  ZOLOFT Take 100 mg by mouth daily.   testosterone cypionate 200 MG/ML injection Commonly known as:  DEPOTESTOSTERONE CYPIONATE Inject 0.75 mLs (150 mg total) into the muscle every 14 (fourteen) days.   topiramate 50 MG tablet Commonly known as:  TOPAMAX Take 1 tablet (50 mg total) by mouth 2 (two) times daily.   VITAMIN D PO Take 500 Units by mouth daily.      Follow-up Information    [name], FNP. Schedule an appointment as soon as possible for a visit in 5 day(s).  Specialties:  Nurse Practitioner, Family Medicine Why:  To be seen with repeat labs (CBC & BMP). Contact information: [place] [address] [phone]        Burnell Blanks, MD. Schedule an appointment as soon as possible for a visit.   Specialty:  Cardiology Contact information: [address] [place] [address] [phone]        Mansouraty, Telford Nab., MD. Call.   Specialties:  Gastroenterology, Internal Medicine Contact information: [place] [address] [phone]          Allergies  Allergen Reactions  . Bee Venom Anaphylaxis  . Shrimp [Shellfish Allergy] Anaphylaxis and Other (See Comments)    "just shrimp"  . Stadol [Butorphanol] Anaphylaxis and Other (See Comments)    respiratory  Distress, couldn't breathe, cardiac arrest  .  Wasp Venom Anaphylaxis      Procedures/Studies: Dg Chest Portable 1 View  Result Date: 02/28/2018 CLINICAL DATA:  Chest pain EXAM: PORTABLE CHEST 1 VIEW COMPARISON:  06/26/2017 FINDINGS: Stable cardiomegaly. Loop recording device projects over the left heart shadow. Mild aortic atherosclerosis without definite aneurysm. No acute pulmonary consolidations. Slight vascular congestion is noted without effusion or pneumothorax. Probable left basilar subsegmental atelectasis or slight pleural thickening. No acute osseous abnormality. IMPRESSION: Low lung volumes with stable cardiomegaly and mild aortic atherosclerosis. Mild central vascular congestion. Electronically Signed   By: Ashley Royalty M.D.   On: 02/28/2018 13:55      Subjective: Feels that his diarrhea may be slightly better this morning.  No pain reported.  Tolerating diet.  Eager to go home.  Indicates that he has been on colchicine for 20+ years.  Has an appointment to see outpatient rheumatology soon on [place].  Discharge Exam:  Vitals:   03/02/18 1640 03/02/18 2128 03/02/18 2230 03/03/18 0427  BP:  (!) 95/59  115/73  Pulse:  66 71 62  Resp:  19 17 19   Temp: 98 F (36.7 C) 98.2 F (36.8 C)  (!) 97.5 F (36.4 C)  TempSrc: Oral Oral  Oral  SpO2:  93% 95% 100%  Weight:      Height:        General exam: Does not elderly male, moderately built and obese sitting up comfortably at edge of bed.  Oral mucosa moist. Respiratory system: Clear to auscultation. Respiratory effort normal. Cardiovascular system: S1 & S2 heard, irregularly irregular. No JVD, murmurs, rubs, gallops or clicks. No pedal edema. Gastrointestinal system: Abdomen is nondistended, soft and nontender. No organomegaly or masses felt. Normal bowel sounds heard. Central nervous system: Alert and oriented. No focal neurological deficits. Extremities: Symmetric 5 x 5 power. Skin: No rashes, lesions or ulcers Psychiatry: Judgement and insight appear  normal. Mood & affect appropriate.     The results of significant diagnostics from this hospitalization (including imaging, microbiology, ancillary and laboratory) are listed below for reference.     Microbiology: Recent Results (from the past 240 hour(s))  Culture, blood (Routine x 2)     Status: None (Preliminary result)   Collection Time: 02/28/18 12:45 PM  Result Value Ref Range Status   Specimen Description BLOOD RIGHT ANTECUBITAL  Final   Special Requests   Final    BOTTLES DRAWN AEROBIC AND ANAEROBIC Blood Culture adequate volume   Culture   Final    NO GROWTH 3 DAYS Performed at [facility], [address], [place], [place] [address]  Report Status PENDING  Incomplete  Culture, blood (Routine x 2)     Status: None (Preliminary result)   Collection Time: 02/28/18 12:47 PM  Result Value Ref Range Status   Specimen Description BLOOD RIGHT ARM  Final   Special Requests   Final    BOTTLES DRAWN AEROBIC AND ANAEROBIC Blood Culture results may not be optimal due to an inadequate volume of blood received in culture bottles   Culture   Final    NO GROWTH 3 DAYS Performed at [facility], [place] [address] [place], [place] [address]    Report Status PENDING  Incomplete  Gastrointestinal Panel by PCR , Stool     Status: None   Collection Time: 02/28/18  5:26 PM  Result Value Ref Range Status   Campylobacter species NOT DETECTED NOT DETECTED Final   Plesimonas shigelloides NOT DETECTED NOT DETECTED Final   Salmonella species NOT DETECTED NOT DETECTED Final   Yersinia enterocolitica NOT DETECTED NOT DETECTED Final   Vibrio species NOT DETECTED NOT DETECTED Final   Vibrio cholerae NOT DETECTED NOT DETECTED Final   Enteroaggregative E coli (EAEC) NOT DETECTED NOT DETECTED Final   Enteropathogenic E coli (EPEC) NOT DETECTED NOT DETECTED Final   Enterotoxigenic E coli (ETEC) NOT DETECTED NOT DETECTED Final   Shiga like toxin producing E coli (STEC) NOT DETECTED NOT  DETECTED Final   Shigella/Enteroinvasive E coli (EIEC) NOT DETECTED NOT DETECTED Final   Cryptosporidium NOT DETECTED NOT DETECTED Final   Cyclospora cayetanensis NOT DETECTED NOT DETECTED Final   Entamoeba histolytica NOT DETECTED NOT DETECTED Final   Giardia lamblia NOT DETECTED NOT DETECTED Final   Adenovirus F40/41 NOT DETECTED NOT DETECTED Final   Astrovirus NOT DETECTED NOT DETECTED Final   Norovirus GI/GII NOT DETECTED NOT DETECTED Final   Rotavirus A NOT DETECTED NOT DETECTED Final   Sapovirus (I, II, IV, and V) NOT DETECTED NOT DETECTED Final    Comment: Performed at [facility], [address] [place], [place] [address]  C difficile quick scan w PCR reflex     Status: None   Collection Time: 02/28/18  5:26 PM  Result Value Ref Range Status   C Diff antigen NEGATIVE NEGATIVE Final   C Diff toxin NEGATIVE NEGATIVE Final   C Diff interpretation No C. difficile detected.  Final     Labs: CBC: Recent Labs  Lab 02/28/18 1150 03/02/18 0203 03/03/18 0445  WBC 5.7 6.1 4.4  NEUTROABS 4.1  --   --   HGB 15.2 13.9 13.1  HCT 47.8 43.5 41.4  MCV 85.2 85.6 86.3  PLT 151 136* 601*   Basic Metabolic Panel: Recent Labs  Lab 02/28/18 1150 03/01/18 0747 03/02/18 0203 03/03/18 0445  NA 141 138 137 139  K 3.4* 3.4* 3.7 3.5  CL 108 108 112* 111  CO2 27 23 19* 22  GLUCOSE 112* 101* 95 130*  BUN 10 10 8  7*  CREATININE 1.34* 1.04 0.98 1.20  CALCIUM 8.9 8.2* 8.0* 8.4*  MG  --   --  1.7  --    Liver Function Tests: Recent Labs  Lab 02/28/18 1150  AST 34  ALT 7  ALKPHOS 53  BILITOT 1.4*  PROT 6.4*  ALBUMIN 3.3*   BNP (last 3 results) Recent Labs    05/27/17 1618 06/06/17 1017  BNP 47.0 46.0   Cardiac Enzymes: Recent Labs  Lab 02/28/18 1535 02/28/18 2040 03/01/18 0229  TROPONINI <0.03 <0.03 <0.03  Time coordinating discharge: 35 minutes  SIGNED:  Vernell Leep, MD, FACP, [facility]. [facility] Pager [phone]  If 7PM-7AM,  please contact night-coverage [email or web address] Password [facility] 03/03/2018, 5:34 PM

## 2018-03-03 NOTE — Discharge Instructions (Signed)

## 2018-03-05 ENCOUNTER — Telehealth: Payer: Self-pay | Admitting: *Deleted

## 2018-03-05 LAB — CULTURE, BLOOD (ROUTINE X 2)
Culture: NO GROWTH
Culture: NO GROWTH
Special Requests: ADEQUATE

## 2018-03-05 NOTE — Telephone Encounter (Signed)
Lm requesting return call to complete TCM and confirm hosp f/u appt  

## 2018-03-06 ENCOUNTER — Telehealth: Payer: Self-pay | Admitting: *Deleted

## 2018-03-06 ENCOUNTER — Ambulatory Visit (INDEPENDENT_AMBULATORY_CARE_PROVIDER_SITE_OTHER): Payer: Medicare Other | Admitting: *Deleted

## 2018-03-06 ENCOUNTER — Encounter (HOSPITAL_COMMUNITY): Payer: Self-pay | Admitting: Gastroenterology

## 2018-03-06 DIAGNOSIS — I639 Cerebral infarction, unspecified: Secondary | ICD-10-CM

## 2018-03-06 NOTE — Progress Notes (Signed)
Carelink Summary Report / Loop Recorder 

## 2018-03-06 NOTE — Telephone Encounter (Signed)
Copied from [place] [phone]. Topic: General - Other >> Mar 06, 2018  9:59 AM Ahmed Prima L wrote: Reason for CRM: Patient said he is will be having cataract eye surgery Thursday 8/29 and wants to know can the eye center fax the form over for clearance or does he need to come in? He said he just saw [place] in July. Please let patient know either way. Call back# [phone]

## 2018-03-06 NOTE — Telephone Encounter (Signed)
We received the form from [facility] and it's in [place] In box.

## 2018-03-06 NOTE — Telephone Encounter (Signed)
Rinaldo Cloud, NP from [facility] calling.  She will be faxing over a clearance for for pt to have eye surgery on 08/29.  They need this faxed back ASAP since he has been hospitalized. Caryl Pina can be reached at [phone]

## 2018-03-06 NOTE — Telephone Encounter (Signed)
Lm requesting return call to complete TCM and confirm hosp f/u appt. CRM created to schedule hospital f/u should pt return call

## 2018-03-07 NOTE — Telephone Encounter (Signed)
Form completed. Patient medically cleared for cataract removal. Had recent cardiology clearance for colonoscopy during hospitalization. Form given to Ramond Craver to fax.

## 2018-03-07 NOTE — Telephone Encounter (Signed)
Paperwork faxed °

## 2018-03-08 ENCOUNTER — Encounter: Payer: Self-pay | Admitting: Gastroenterology

## 2018-03-08 ENCOUNTER — Telehealth: Payer: Self-pay

## 2018-03-08 ENCOUNTER — Other Ambulatory Visit: Payer: Self-pay | Admitting: Gastroenterology

## 2018-03-08 ENCOUNTER — Telehealth: Payer: Self-pay | Admitting: Gastroenterology

## 2018-03-08 DIAGNOSIS — H2511 Age-related nuclear cataract, right eye: Secondary | ICD-10-CM | POA: Diagnosis not present

## 2018-03-08 DIAGNOSIS — Z961 Presence of intraocular lens: Secondary | ICD-10-CM | POA: Diagnosis not present

## 2018-03-08 DIAGNOSIS — Z9841 Cataract extraction status, right eye: Secondary | ICD-10-CM | POA: Diagnosis not present

## 2018-03-08 DIAGNOSIS — Z139 Encounter for screening, unspecified: Secondary | ICD-10-CM

## 2018-03-08 DIAGNOSIS — Z9842 Cataract extraction status, left eye: Secondary | ICD-10-CM | POA: Diagnosis not present

## 2018-03-08 DIAGNOSIS — K529 Noninfective gastroenteritis and colitis, unspecified: Secondary | ICD-10-CM

## 2018-03-08 DIAGNOSIS — H25811 Combined forms of age-related cataract, right eye: Secondary | ICD-10-CM | POA: Diagnosis not present

## 2018-03-08 NOTE — Telephone Encounter (Signed)
I called and spoke with the patient to give him an update on his results. The patient describes that he has been doing well over the course the last few days since his discharge and actually is now off of all Imodium. He underwent cataract surgery today. He is not noticing any blood. He has soft more formed stools than when he was admitted to the hospital. His biopsy results are noted in the chart but do show evidence of active chronic ileitis as well as active chronic colitis throughout the colon even in the portions of the colon that were endoscopically normal-appearing.  Interestingly after discussion with pathology the chronicity is manifested mostly by lamina propria and basal lymphoplasma cytosis with only mild architectural distortions with no evidence of granulomas or dysplasia and thus the concern of a de novo inflammatory bowel disease is felt to be less likely though not impossible.  Other differential diagnoses that were considered would be medication induced or autoimmune immune enterocolitis.  We will attempt to send off labs to see what his overall inflammation rate is from his hospital stay to now as well as getting labs to try and further confirm or remove from our differential inflammatory bowel disease.  As the patient is doing well at this point in time I do not think that we need to start him on treatment but we will keep that in mind (consideration of steroids, mesalamine products, antimotility agents).  I will hold off on the IBD-Profile.  I may consider role of rescoping in a few months if he is doing better (though with chronic changes already occurring, I don't know it would change potential workup for now). We will schedule patient for follow-up in the next 2 to 3 weeks and discuss things further from there.  I will hold off on having pathology reread for now at [facility]. All questions answered to the best of our ability when I was on the phone with him and he is appreciative for the  call back.  Justice Britain, MD [facility] Office # [phone]

## 2018-03-08 NOTE — Telephone Encounter (Signed)
-----   Message from Irving Copas., MD sent at 03/08/2018  4:16 PM EDT ----- Regarding: For Follow up and Other Dear Chong Sicilian, Please schedule this patient for a follow up in clinic in the next 2-3 weeks. I have placed lab orders to be performed prior to his clinic visit if possible (they are all future orders). I want to get the Anti-Enterocyte Antibody test ([email or web address]=[email or web address]_[email or web address]), but I cannot order it. Please let me know if you can get this ordered. Thanks.  Chester Holstein

## 2018-03-09 NOTE — Telephone Encounter (Signed)
Thanks for update, please let me know when you do if it can be done or if it needs to be done in the hospital setting. Appreciate it.

## 2018-03-09 NOTE — Telephone Encounter (Signed)
The lab can be done at [facility].  They will need to have a diagnosis code attached to the order and they refer the pt bring in the order with them.  Per Dr Rush Landmark he will just follow up with the pt in clinic and decide at that point.

## 2018-03-09 NOTE — Telephone Encounter (Signed)
The pt has been advised and will have labs as ordered.     Dr Rush Landmark I have called [facility], [facility] and our lab and none are aware of the test you would like.  I have been unable to get it ordered for you.  Please advise

## 2018-03-13 NOTE — Telephone Encounter (Signed)
Thank you for the information. I am forwarding to Dr. Amil Amen who is seeing the patient soon for his chronic gout.

## 2018-03-15 DIAGNOSIS — M1A09X1 Idiopathic chronic gout, multiple sites, with tophus (tophi): Secondary | ICD-10-CM | POA: Diagnosis not present

## 2018-03-15 DIAGNOSIS — Z6841 Body Mass Index (BMI) 40.0 and over, adult: Secondary | ICD-10-CM | POA: Diagnosis not present

## 2018-03-19 LAB — CUP PACEART REMOTE DEVICE CHECK
Date Time Interrogation Session: 20190725143633
Implantable Pulse Generator Implant Date: 20170719

## 2018-03-21 ENCOUNTER — Encounter: Payer: Self-pay | Admitting: Family Medicine

## 2018-03-21 ENCOUNTER — Ambulatory Visit (INDEPENDENT_AMBULATORY_CARE_PROVIDER_SITE_OTHER): Payer: Medicare Other | Admitting: Family Medicine

## 2018-03-21 VITALS — BP 102/70 | HR 90 | Temp 97.6°F | Ht 77.0 in | Wt 318.0 lb

## 2018-03-21 DIAGNOSIS — M1A9XX1 Chronic gout, unspecified, with tophus (tophi): Secondary | ICD-10-CM | POA: Diagnosis not present

## 2018-03-21 DIAGNOSIS — R634 Abnormal weight loss: Secondary | ICD-10-CM | POA: Diagnosis not present

## 2018-03-21 DIAGNOSIS — K2960 Other gastritis without bleeding: Secondary | ICD-10-CM

## 2018-03-21 DIAGNOSIS — Z23 Encounter for immunization: Secondary | ICD-10-CM

## 2018-03-21 DIAGNOSIS — Z09 Encounter for follow-up examination after completed treatment for conditions other than malignant neoplasm: Secondary | ICD-10-CM

## 2018-03-21 NOTE — Patient Instructions (Signed)
Good to see you today!  
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
## 2018-03-29 DIAGNOSIS — K2970 Gastritis, unspecified, without bleeding: Secondary | ICD-10-CM | POA: Insufficient documentation

## 2018-04-03 ENCOUNTER — Other Ambulatory Visit (INDEPENDENT_AMBULATORY_CARE_PROVIDER_SITE_OTHER): Payer: Medicare Other

## 2018-04-03 DIAGNOSIS — E230 Hypopituitarism: Secondary | ICD-10-CM

## 2018-04-03 LAB — TSH: TSH: 2.9 u[IU]/mL (ref 0.35–4.50)

## 2018-04-03 LAB — CBC
HCT: 46.6 % (ref 39.0–52.0)
Hemoglobin: 15.6 g/dL (ref 13.0–17.0)
MCHC: 33.5 g/dL (ref 30.0–36.0)
MCV: 83.5 fl (ref 78.0–100.0)
Platelets: 160 10*3/uL (ref 150.0–400.0)
RBC: 5.58 Mil/uL (ref 4.22–5.81)
RDW: 16.2 % — ABNORMAL HIGH (ref 11.5–15.5)
WBC: 5.4 10*3/uL (ref 4.0–10.5)

## 2018-04-03 LAB — BASIC METABOLIC PANEL
BUN: 21 mg/dL (ref 6–23)
CO2: 25 mEq/L (ref 19–32)
Calcium: 9.5 mg/dL (ref 8.4–10.5)
Chloride: 105 mEq/L (ref 96–112)
Creatinine, Ser: 0.99 mg/dL (ref 0.40–1.50)
GFR: 78.87 mL/min (ref >60.00)
Glucose, Bld: 93 mg/dL (ref 70–99)
Potassium: 3.4 mEq/L — ABNORMAL LOW (ref 3.5–5.1)
Sodium: 138 mEq/L (ref 135–145)

## 2018-04-03 LAB — T3, FREE: T3, Free: 4.9 pg/mL — ABNORMAL HIGH (ref 2.3–4.2)

## 2018-04-03 LAB — CUP PACEART REMOTE DEVICE CHECK
Date Time Interrogation Session: 20190827163938
Implantable Pulse Generator Implant Date: 20170719

## 2018-04-03 LAB — TESTOSTERONE: Testosterone: 526.84 ng/dL (ref 300.00–890.00)

## 2018-04-03 LAB — T4, FREE: Free T4: 0.84 ng/dL (ref 0.60–1.60)

## 2018-04-05 ENCOUNTER — Telehealth: Payer: Self-pay

## 2018-04-05 ENCOUNTER — Other Ambulatory Visit (INDEPENDENT_AMBULATORY_CARE_PROVIDER_SITE_OTHER): Payer: Medicare Other

## 2018-04-05 DIAGNOSIS — K529 Noninfective gastroenteritis and colitis, unspecified: Secondary | ICD-10-CM

## 2018-04-05 LAB — C-REACTIVE PROTEIN: CRP: 0.5 mg/dL (ref 0.5–20.0)

## 2018-04-05 NOTE — Telephone Encounter (Signed)
Spoke w/ pt and requested that he send a manual transmission b/c his home monitor has not updated in at least 14 days.   

## 2018-04-05 NOTE — Addendum Note (Signed)
Addended by: Isaiah Serge D on: 04/05/2018 11:06 AM   Modules accepted: Orders

## 2018-04-06 ENCOUNTER — Ambulatory Visit (INDEPENDENT_AMBULATORY_CARE_PROVIDER_SITE_OTHER): Payer: Medicare Other | Admitting: Endocrinology

## 2018-04-06 ENCOUNTER — Other Ambulatory Visit: Payer: Self-pay | Admitting: Endocrinology

## 2018-04-06 ENCOUNTER — Encounter: Payer: Self-pay | Admitting: Endocrinology

## 2018-04-06 VITALS — BP 112/64 | HR 68 | Ht 77.0 in | Wt 323.0 lb

## 2018-04-06 DIAGNOSIS — I6381 Other cerebral infarction due to occlusion or stenosis of small artery: Secondary | ICD-10-CM | POA: Diagnosis not present

## 2018-04-06 DIAGNOSIS — E876 Hypokalemia: Secondary | ICD-10-CM | POA: Diagnosis not present

## 2018-04-06 DIAGNOSIS — K529 Noninfective gastroenteritis and colitis, unspecified: Secondary | ICD-10-CM

## 2018-04-06 DIAGNOSIS — E230 Hypopituitarism: Secondary | ICD-10-CM

## 2018-04-06 LAB — HCV COMMENT:

## 2018-04-06 LAB — HEPATITIS C ANTIBODY (REFLEX): HCV Ab: <0.1 s/co ratio (ref 0.0–0.9)

## 2018-04-06 MED ORDER — POTASSIUM CHLORIDE ER 10 MEQ PO TBCR: 10.0000 meq | EXTENDED_RELEASE_TABLET | Freq: Every day | ORAL | 0 refills | Status: DC
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
## 2018-04-06 NOTE — Patient Instructions (Addendum)
Testo dose 0.5 cc every 2 weeks  Get lab in [facility] November  Check potassium

## 2018-04-09 ENCOUNTER — Ambulatory Visit (INDEPENDENT_AMBULATORY_CARE_PROVIDER_SITE_OTHER): Payer: Medicare Other | Admitting: *Deleted

## 2018-04-09 DIAGNOSIS — I639 Cerebral infarction, unspecified: Secondary | ICD-10-CM | POA: Diagnosis not present

## 2018-04-09 LAB — ANTI-NUCLEAR AB-TITER (ANA TITER): ANA Titer 1: 1:80 {titer} — ABNORMAL HIGH

## 2018-04-09 LAB — HEPATITIS A ANTIBODY, TOTAL: Hepatitis A AB,Total: NONREACTIVE

## 2018-04-09 LAB — ANA: Anti Nuclear Antibody(ANA): POSITIVE — AB

## 2018-04-09 LAB — HEPATITIS B SURFACE ANTIGEN: Hepatitis B Surface Ag: NONREACTIVE

## 2018-04-09 LAB — HEPATITIS B CORE ANTIBODY, TOTAL: Hep B Core Total Ab: NONREACTIVE

## 2018-04-09 LAB — HEPATITIS B SURFACE ANTIBODY,QUALITATIVE: Hep B S Ab: NONREACTIVE

## 2018-04-09 NOTE — Progress Notes (Signed)
Carelink Summary Report / Loop Recorder 

## 2018-04-10 ENCOUNTER — Ambulatory Visit (INDEPENDENT_AMBULATORY_CARE_PROVIDER_SITE_OTHER): Payer: Medicare Other | Admitting: Gastroenterology

## 2018-04-10 ENCOUNTER — Encounter: Payer: Self-pay | Admitting: Gastroenterology

## 2018-04-10 ENCOUNTER — Other Ambulatory Visit (INDEPENDENT_AMBULATORY_CARE_PROVIDER_SITE_OTHER): Payer: Medicare Other

## 2018-04-10 VITALS — BP 138/90 | HR 80 | Ht 77.0 in | Wt 325.6 lb

## 2018-04-10 DIAGNOSIS — K529 Noninfective gastroenteritis and colitis, unspecified: Secondary | ICD-10-CM

## 2018-04-10 DIAGNOSIS — Z7952 Long term (current) use of systemic steroids: Secondary | ICD-10-CM | POA: Diagnosis not present

## 2018-04-10 DIAGNOSIS — R933 Abnormal findings on diagnostic imaging of other parts of digestive tract: Secondary | ICD-10-CM

## 2018-04-10 DIAGNOSIS — Z87898 Personal history of other specified conditions: Secondary | ICD-10-CM

## 2018-04-10 DIAGNOSIS — Z0000 Encounter for general adult medical examination without abnormal findings: Secondary | ICD-10-CM | POA: Diagnosis not present

## 2018-04-10 LAB — HIGH SENSITIVITY CRP: CRP, High Sensitivity: 3.28 mg/L (ref 0.000–5.000)
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
## 2018-04-10 NOTE — Patient Instructions (Signed)
Your provider has requested that you go to the basement level for lab work before leaving today. Press "B" on the elevator. The lab is located at the first door on the left as you exit the elevator.  You will be notified by our office for a follow up appointment for 4/20  Thank you for entrusting me with your care and choosing [facility].  Dr Rush Landmark

## 2018-04-11 DIAGNOSIS — N3021 Other chronic cystitis with hematuria: Secondary | ICD-10-CM | POA: Diagnosis not present

## 2018-04-11 LAB — CUP PACEART REMOTE DEVICE CHECK
Date Time Interrogation Session: 20190929173616
Implantable Pulse Generator Implant Date: 20170719

## 2018-04-12 ENCOUNTER — Encounter: Payer: Self-pay | Admitting: Gastroenterology

## 2018-04-12 DIAGNOSIS — Z87898 Personal history of other specified conditions: Secondary | ICD-10-CM | POA: Insufficient documentation

## 2018-04-12 DIAGNOSIS — R933 Abnormal findings on diagnostic imaging of other parts of digestive tract: Secondary | ICD-10-CM | POA: Insufficient documentation

## 2018-04-12 DIAGNOSIS — K529 Noninfective gastroenteritis and colitis, unspecified: Secondary | ICD-10-CM | POA: Insufficient documentation

## 2018-04-12 LAB — IBD EXPANDED PANEL
ACCA: 109 units — ABNORMAL HIGH (ref 0–90)
ALCA: 40 units (ref 0–60)
AMCA: 160 units — ABNORMAL HIGH (ref 0–100)
Atypical pANCA: NEGATIVE
gASCA: 69 units — ABNORMAL HIGH (ref 0–50)

## 2018-04-13 LAB — VITAMIN D 1,25 DIHYDROXY
Vitamin D 1, 25 (OH)2 Total: 25 pg/mL (ref 18–72)
Vitamin D2 1, 25 (OH)2: <8 pg/mL
Vitamin D3 1, 25 (OH)2: 25 pg/mL

## 2018-04-16 ENCOUNTER — Other Ambulatory Visit: Payer: Medicare Other

## 2018-04-17 LAB — CALPROTECTIN, FECAL: Calprotectin, Fecal: 113 ug/g (ref 0–120)

## 2018-04-20 ENCOUNTER — Telehealth: Payer: Self-pay | Admitting: Gastroenterology

## 2018-04-20 ENCOUNTER — Other Ambulatory Visit: Payer: Self-pay

## 2018-04-20 DIAGNOSIS — K529 Noninfective gastroenteritis and colitis, unspecified: Secondary | ICD-10-CM

## 2018-04-20 DIAGNOSIS — K559 Vascular disorder of intestine, unspecified: Secondary | ICD-10-CM

## 2018-04-20 NOTE — Telephone Encounter (Signed)
The patient has been notified of this information and all questions answered. The pt has been advised of the information and verbalized understanding.    

## 2018-04-20 NOTE — Telephone Encounter (Signed)
I called and spoke with patient on 04/19/2018. We discussed his lab results including a normal vitamin D, a CRP that was normal, and a fecal calprotectin that was borderline abnormal.  We had sent in IBD expanded panel at the time of his last visit to further clarify whether the patient could have underlying ulcerative colitis versus Crohn's disease understanding the sensitivity and specificity of the test values are not always significant enough for [name] to give a significant diagnosis.  That being said the patient was gASCA positive, ACCA positive, AMCA positive-and this would be suggestive of underlying Crohn's disease rather than ulcerative colitis.  The interpretation or comments from the agency suggests that this could be a patient who has a degree of risk in regards to aggressiveness of disease.  There this is not necessarily confirmatory.  When you put the patient's clinical history and diagnostic evaluation face-to-face I do suspect this is an inflammatory bowel disease that is currently indeterminate but likely more associated with Crohn's.  Patient has been doing extremely well at home still.  I think to further clarify the patient's diagnosis and understand our need for potentially considering a biologic agent for I would entertain in enterography either MR or CT.  I would like an MR enterography if we can do this in an open MRI if not a CT enterography will be fine patient has had issues with prior MRI.  I discussed these results with the patient and his wife and discussed about symptoms to be on the look out for for them to let me know should we need to consider therapy sooner rather than later.  I will discuss his case with a few of my colleagues as well to better understand our next steps in evaluation and potential therapies of this patient.  I again consider this an indeterminate colitis which has been partially maintained in a symptomatic remission while being on steroids for his adrenal  insufficiency.

## 2018-04-20 NOTE — Telephone Encounter (Signed)
Left message on machine to call back  

## 2018-04-20 NOTE — Telephone Encounter (Signed)
You have been scheduled for an MRI at [facility] on 05/01/18. Your appointment time is 1 pm. Please arrive at 11 am minutes prior to your appointment time for registration purposes. Please make certain not to have anything to eat or drink 6 hours prior to your test. In addition, if you have any metal in your body, have a pacemaker or defibrillator, please be sure to let your ordering physician know. This test typically takes 45 minutes to 1 hour to complete. Should you need to reschedule, please call [phone] to do so.

## 2018-04-26 ENCOUNTER — Other Ambulatory Visit: Payer: Self-pay | Admitting: Gastroenterology

## 2018-04-26 ENCOUNTER — Other Ambulatory Visit: Payer: Self-pay

## 2018-04-26 DIAGNOSIS — I998 Other disorder of circulatory system: Secondary | ICD-10-CM

## 2018-04-30 ENCOUNTER — Other Ambulatory Visit: Payer: Self-pay | Admitting: Family Medicine

## 2018-05-01 ENCOUNTER — Ambulatory Visit (HOSPITAL_COMMUNITY)
Admission: RE | Admit: 2018-05-01 | Discharge: 2018-05-01 | Disposition: A | Discharge status: Home / Self Care | Payer: Medicare Other | Source: Ambulatory Visit | Attending: Gastroenterology | Admitting: Gastroenterology

## 2018-05-01 DIAGNOSIS — I998 Other disorder of circulatory system: Secondary | ICD-10-CM | POA: Insufficient documentation

## 2018-05-01 DIAGNOSIS — K529 Noninfective gastroenteritis and colitis, unspecified: Secondary | ICD-10-CM | POA: Insufficient documentation

## 2018-05-01 DIAGNOSIS — I700 Atherosclerosis of aorta: Secondary | ICD-10-CM | POA: Diagnosis not present

## 2018-05-01 DIAGNOSIS — K559 Vascular disorder of intestine, unspecified: Secondary | ICD-10-CM | POA: Insufficient documentation

## 2018-05-01 DIAGNOSIS — Z8719 Personal history of other diseases of the digestive system: Secondary | ICD-10-CM | POA: Diagnosis not present

## 2018-05-01 MED ORDER — BARIUM SULFATE 0.1 % PO SUSP
ORAL | Status: AC
  Filled 2018-05-01: qty 3

## 2018-05-01 MED ORDER — GADOBUTROL 1 MMOL/ML IV SOLN
10.0000 mL | Freq: Once | INTRAVENOUS | Status: AC | PRN
  Administered 2018-05-01: 9 mL via INTRAVENOUS

## 2018-05-02 ENCOUNTER — Other Ambulatory Visit: Payer: Self-pay | Admitting: Family Medicine

## 2018-05-02 ENCOUNTER — Other Ambulatory Visit: Payer: Self-pay | Admitting: Endocrinology

## 2018-05-02 DIAGNOSIS — E230 Hypopituitarism: Secondary | ICD-10-CM

## 2018-05-02 NOTE — Telephone Encounter (Signed)
Historical medication not filled by you. Okay to refill?

## 2018-05-02 NOTE — Telephone Encounter (Signed)
Called and spoke to patient. He states he will call Dr.Kumar but he is really hurting and really needs this refilled. He states Dr.Kumar told him that his primary will continue to refill this prescription. I informed him to contact the office if anything was further needed.

## 2018-05-02 NOTE — Telephone Encounter (Signed)
Last Rx 06/2017 and written by alternate provider. Last OV 03/2018

## 2018-05-02 NOTE — Telephone Encounter (Signed)
Please call patient and see if he can get his endocrinologist to refill his Cortef.

## 2018-05-02 NOTE — Telephone Encounter (Signed)
Copied from [place] [id number]. Topic: Quick Communication - Rx Refill/Question >> May 02, 2018 11:15 AM Yvette Rack wrote: Medication: hydrocortisone (CORTEF) 10 MG tablet  pt going out of town tomorrow pharmacy sent request over on Monday  Has the patient contacted their pharmacy? Yes.  Requested this medicine Monday (Agent: If no, request that the patient contact the pharmacy for the refill.) (Agent: If yes, when and what did the pharmacy advise?) advised pt request was sent out on Monday  Preferred Pharmacy (with phone number or street name): [id number] [address], [place] [phone] (Phone) [phone] (Fax)    Agent: Please be advised that RX refills may take up to 3 business days. We ask that you follow-up with your pharmacy.

## 2018-05-02 NOTE — Telephone Encounter (Signed)
Cortef refill request

## 2018-05-02 NOTE — Telephone Encounter (Signed)
Do not want him to be without. Sent in 90 day supply

## 2018-05-02 NOTE — Telephone Encounter (Signed)
Patient stated Dr. Dwyane Dee filled hydrocortisone (CORTEF) 10 MG tablet, levothyroxine (SYNTHROID, LEVOTHROID) 112 MCG tablet, testosterone cypionate (DEPO-TESTOSTERONE) 200 MG/ML injection once without refills. Patient is stating Primary Care is refusing to refill these medications. Patient would like these filled with refills. Please Advise.  Ph # [phone] [id number] [address], [place]

## 2018-05-07 MED ORDER — LEVOTHYROXINE SODIUM 112 MCG PO TABS: ORAL_TABLET | ORAL | 0 refills | Status: DC

## 2018-05-07 NOTE — Addendum Note (Signed)
Addended by: Benson Setting L on: 05/07/2018 10:01 AM   Modules accepted: Orders

## 2018-05-07 NOTE — Telephone Encounter (Signed)
Hydrocortisone was refilled by primary care on 05/02/18.  Refills for levothyroxine sent to pharmacy. Message has been forwarded to Dr. Dwyane Dee to address approval for Testosterone refill

## 2018-05-10 ENCOUNTER — Other Ambulatory Visit: Payer: Self-pay | Admitting: Endocrinology

## 2018-05-10 DIAGNOSIS — M1A09X1 Idiopathic chronic gout, multiple sites, with tophus (tophi): Secondary | ICD-10-CM | POA: Diagnosis not present

## 2018-05-10 DIAGNOSIS — M25532 Pain in left wrist: Secondary | ICD-10-CM | POA: Diagnosis not present

## 2018-05-10 DIAGNOSIS — Z6841 Body Mass Index (BMI) 40.0 and over, adult: Secondary | ICD-10-CM | POA: Diagnosis not present

## 2018-05-11 ENCOUNTER — Ambulatory Visit (INDEPENDENT_AMBULATORY_CARE_PROVIDER_SITE_OTHER): Payer: Medicare Other | Admitting: *Deleted

## 2018-05-11 DIAGNOSIS — I639 Cerebral infarction, unspecified: Secondary | ICD-10-CM

## 2018-05-12 NOTE — Progress Notes (Signed)
Carelink Summary Report / Loop Recorder 

## 2018-05-14 MED ORDER — TESTOSTERONE CYPIONATE 200 MG/ML IM SOLN: 150.0000 mg | INTRAMUSCULAR | 5 refills | Status: DC

## 2018-06-01 LAB — CUP PACEART REMOTE DEVICE CHECK
Date Time Interrogation Session: 20191101174038
Implantable Pulse Generator Implant Date: 20170719

## 2018-06-05 ENCOUNTER — Ambulatory Visit (INDEPENDENT_AMBULATORY_CARE_PROVIDER_SITE_OTHER): Payer: Medicare Other | Admitting: Family Medicine

## 2018-06-05 ENCOUNTER — Ambulatory Visit (INDEPENDENT_AMBULATORY_CARE_PROVIDER_SITE_OTHER): Payer: Medicare Other

## 2018-06-05 ENCOUNTER — Encounter: Payer: Self-pay | Admitting: Family Medicine

## 2018-06-05 VITALS — BP 112/62 | HR 112 | Temp 97.9°F | Wt 327.0 lb

## 2018-06-05 DIAGNOSIS — R05 Cough: Secondary | ICD-10-CM | POA: Diagnosis not present

## 2018-06-05 DIAGNOSIS — R059 Cough, unspecified: Secondary | ICD-10-CM

## 2018-06-05 DIAGNOSIS — J069 Acute upper respiratory infection, unspecified: Secondary | ICD-10-CM

## 2018-06-05 DIAGNOSIS — I6381 Other cerebral infarction due to occlusion or stenosis of small artery: Secondary | ICD-10-CM

## 2018-06-05 DIAGNOSIS — R000 Tachycardia, unspecified: Secondary | ICD-10-CM

## 2018-06-05 DIAGNOSIS — R0989 Other specified symptoms and signs involving the circulatory and respiratory systems: Secondary | ICD-10-CM | POA: Diagnosis not present

## 2018-06-05 DIAGNOSIS — R042 Hemoptysis: Secondary | ICD-10-CM | POA: Diagnosis not present

## 2018-06-05 NOTE — Progress Notes (Signed)
Subjective:    Patient ID: Dennis Fountain Sr., male    DOB: 1946/02/23, 72 y.o.   MRN: [id number]  No chief complaint on file. Patient is accompanied by his wife.  HPI Patient was seen today for acute concern.  Pt with pmh sig for Parkinson's, CHF, CVA, OSA on CPAP, Addison's disease, effusion,h/o prostate cancer, h/o pneumonia. Pt seen by Clarene Reamer, NP.  Pt endorses chills, sore throat, decreased appetite, coughing with occasional blood tinged sputum, weakness, body aches x2 days.  Pt states his "lungs feel wet".  Pt has not tried anything for his symptoms.  Denies sick contacts, fever, HAs, ear pain/pressure.  Past Medical History:  Diagnosis Date  . Addison's disease ([place]) 01/2016  . Arthritis    low back - DDD  . Cellulitis, scrotum 08/02/2014  . Chronic diastolic CHF (congestive heart failure) ([place])    Echo 12/18: severe LVH, EF 60-65, Gr 1 DD // Echo 5/18: EF 65-70, Gr 1 DD  . Chronic lower back pain    "from [place] 2007"  . Complication of anesthesia    Sometimes has N&V /w anesth.   . Coronary artery disease    NSTEMI >> LHC 9/01: prox and mid LAD 50-70; mRCA 40 >> med Rx // Nuc 8/13 [facility]):  no infarct or ischemia, EF 59  . Elevated PSA   . Epididymitis, left 08/04/2014  . History of chronic bronchitis   . History of gout   . History of stroke 01/22/2016   2004 - "right brain stem; no residual " // 2017  . Hypertension   . Hypocholesteremia   . Hypothyroidism   . Infection of skin due to methicillin resistant Staphylococcus aureus (MRSA) 12/28/2017  . Kidney stone   . Myocardial infarction [facility]) 2001   2001- cardiac cath., cardiac clearanece note dr Otho Perl 05-14-13 on chart, stress test results 02-21-12 on chart  . OSA on CPAP    cpap setting of 10  . Parkinson's disease ([place])    stage 4   . Pneumonia 2000's and 2013  . PONV (postoperative nausea and vomiting)   . [facility] spotted fever   . Testicular cancer ([place]) 2015    Allergies  Allergen Reactions   . Bee Venom Anaphylaxis  . Shrimp [Shellfish Allergy] Anaphylaxis and Other (See Comments)    "just shrimp"  . Stadol [Butorphanol] Anaphylaxis and Other (See Comments)    respiratory  Distress, couldn't breathe, cardiac arrest  . Wasp Venom Anaphylaxis    ROS General: Denies fever, chills, night sweats, changes in weight, changes in appetite  +chills, body aches HEENT: Denies headaches, ear pain, changes in vision, rhinorrhea   +sore throat CV: Denies CP, palpitations, SOB, orthopnea Pulm: Denies SOB, wheezing  +cough with blood tinged sputum GI: Denies abdominal pain, nausea, vomiting, diarrhea, constipation GU: Denies dysuria, hematuria, frequency, vaginal discharge Msk: Denies muscle cramps, joint pains Neuro: Denies weakness, numbness, tingling Skin: Denies rashes, bruising Psych: Denies depression, anxiety, hallucinations     Objective:    Blood pressure 112/62, pulse (!) 112, temperature 97.9 F (36.6 C), temperature source Oral, weight (!) 327 lb (148.3 kg), SpO2 98 %.  Gen. Pleasant, well-nourished, in mild distress, normal affect HEENT: [place]/AT, face symmetric, no scleral icterus, PERRLA, nares patent without drainage, pharynx with erythema, no exudate.  TMs full b/l. Lungs: cough, no accessory muscle use, CTAB, no wheezes or rales Cardiovascular: tachycardic, no m/r/g, no peripheral edema Neuro:  A&Ox3, CN II-XII intact, slow stiff gait Skin:  Warm,  no lesions/ rash  Wt Readings from Last 3 Encounters:  04/10/18 (!) 325 lb 9.6 oz (147.7 kg)  04/06/18 (!) 323 lb (146.5 kg)  03/21/18 (!) 318 lb (144.2 kg)    Lab Results  Component Value Date   WBC 5.4 04/03/2018   HGB 15.6 04/03/2018   HCT 46.6 04/03/2018   PLT 160.0 04/03/2018   GLUCOSE 93 04/03/2018   CHOL 117 06/27/2017   TRIG 105 06/27/2017   HDL 35 (L) 06/27/2017   LDLCALC 61 06/27/2017   ALT 7 02/28/2018   AST 34 02/28/2018   NA 138 04/03/2018   K 3.4 (L) 04/03/2018   CL 105 04/03/2018   CREATININE  0.99 04/03/2018   BUN 21 04/03/2018   CO2 25 04/03/2018   TSH 2.90 04/03/2018   PSA 1.220 02/22/2017   INR 1.09 11/15/2016   HGBA1C 5.2 11/17/2017   MICROALBUR 0.9 01/12/2018    Assessment/Plan:  Cough  -rapid influenza---negative -discussed supportive care -given upcoming holiday wknd will obtain CXR - Plan: DG Chest 2 View -Given RTC or ED precautions for worsening symptoms  Viral upper respiratory tract infection -supportive care -given handout -given RTC or ED precautions  Tachycardia -possibly 2/2 coughing, discomfort, h/o afib, PE- less likely as pt on Eliquis -discussed the importance of hydration.  F/u prn  Grier Mitts, MD

## 2018-06-05 NOTE — Patient Instructions (Signed)
Upper Respiratory Infection, Adult Most upper respiratory infections (URIs) are a viral infection of the air passages leading to the lungs. A URI affects the nose, throat, and upper air passages. The most common type of URI is nasopharyngitis and is typically referred to as "the common cold." URIs run their course and usually go away on their own. Most of the time, a URI does not require medical attention, but sometimes a bacterial infection in the upper airways can follow a viral infection. This is called a secondary infection. Sinus and middle ear infections are common types of secondary upper respiratory infections. Bacterial pneumonia can also complicate a URI. A URI can worsen asthma and chronic obstructive pulmonary disease (COPD). Sometimes, these complications can require emergency medical care and may be life threatening. What are the causes? Almost all URIs are caused by viruses. A virus is a type of germ and can spread from one person to another. What increases the risk? You may be at risk for a URI if:  You smoke.  You have chronic heart or lung disease.  You have a weakened defense (immune) system.  You are very young or very old.  You have nasal allergies or asthma.  You work in crowded or poorly ventilated areas.  You work in health care facilities or schools.  What are the signs or symptoms? Symptoms typically develop 2-3 days after you come in contact with a cold virus. Most viral URIs last 7-10 days. However, viral URIs from the influenza virus (flu virus) can last 14-18 days and are typically more severe. Symptoms may include:  Runny or stuffy (congested) nose.  Sneezing.  Cough.  Sore throat.  Headache.  Fatigue.  Fever.  Loss of appetite.  Pain in your forehead, behind your eyes, and over your cheekbones (sinus pain).  Muscle aches.  How is this diagnosed? Your health care provider may diagnose a URI by:  Physical exam.  Tests to check that your  symptoms are not due to another condition such as: ? Strep throat. ? Sinusitis. ? Pneumonia. ? Asthma.  How is this treated? A URI goes away on its own with time. It cannot be cured with medicines, but medicines may be prescribed or recommended to relieve symptoms. Medicines may help:  Reduce your fever.  Reduce your cough.  Relieve nasal congestion.  Follow these instructions at home:  Take medicines only as directed by your health care provider.  Gargle warm saltwater or take cough drops to comfort your throat as directed by your health care provider.  Use a warm mist humidifier or inhale steam from a shower to increase air moisture. This may make it easier to breathe.  Drink enough fluid to keep your urine clear or pale yellow.  Eat soups and other clear broths and maintain good nutrition.  Rest as needed.  Return to work when your temperature has returned to normal or as your health care provider advises. You may need to stay home longer to avoid infecting others. You can also use a face mask and careful hand washing to prevent spread of the virus.  Increase the usage of your inhaler if you have asthma.  Do not use any tobacco products, including cigarettes, chewing tobacco, or electronic cigarettes. If you need help quitting, ask your health care provider. How is this prevented? The best way to protect yourself from getting a cold is to practice good hygiene.  Avoid oral or hand contact with people with cold symptoms.  Wash your   hands often if contact occurs.  There is no clear evidence that vitamin C, vitamin E, echinacea, or exercise reduces the chance of developing a cold. However, it is always recommended to get plenty of rest, exercise, and practice good nutrition. Contact a health care provider if:  You are getting worse rather than better.  Your symptoms are not controlled by medicine.  You have chills.  You have worsening shortness of breath.  You have  brown or red mucus.  You have yellow or brown nasal discharge.  You have pain in your face, especially when you bend forward.  You have a fever.  You have swollen neck glands.  You have pain while swallowing.  You have white areas in the back of your throat. Get help right away if:  You have severe or persistent: ? Headache. ? Ear pain. ? Sinus pain. ? Chest pain.  You have chronic lung disease and any of the following: ? Wheezing. ? Prolonged cough. ? Coughing up blood. ? A change in your usual mucus.  You have a stiff neck.  You have changes in your: ? Vision. ? Hearing. ? Thinking. ? Mood. This information is not intended to replace advice given to you by your health care provider. Make sure you discuss any questions you have with your health care provider. Document Released: 12/21/2000 Document Revised: 02/28/2016 Document Reviewed: 10/02/2013 Elsevier Interactive Patient Education  2018 Elsevier Inc.  

## 2018-06-08 ENCOUNTER — Encounter: Payer: Self-pay | Admitting: Family Medicine

## 2018-06-08 ENCOUNTER — Ambulatory Visit: Payer: Self-pay | Admitting: *Deleted

## 2018-06-08 NOTE — Telephone Encounter (Signed)
Summary: clinical advice   Relation to pt: self  Call back number: [phone] Pharmacy: [facility], [place] [phone] (Phone) [phone] (Fax)    Reason for call:  Patient was last seen Billie Ruddy, MD 06/05/18 for congestion, lungs are rattling and chills, sore throat, symptoms have not improved patient seeking clinical advice. Patient also inquiring about imaging results that were taken 06/05/18 patient inquiring if he had Pneumonia, please advise       Reason for Disposition . Coughing up rusty-colored (reddish-brown) sputum  Answer Assessment - Initial Assessment Questions 1. ONSET: "When did the cough begin?"      4-5 days 2. SEVERITY: "How bad is the cough today?"      Easier today- very productive 3. RESPIRATORY DISTRESS: "Describe your breathing."      Not effecting breathing 4. FEVER: "Do you have a fever?" If so, ask: "What is your temperature, how was it measured, and when did it start?"     no 5. SPUTUM: "Describe the color of your sputum" (clear, white, yellow, green)     Dark chocolate color 6. HEMOPTYSIS: "Are you coughing up any blood?" If so ask: "How much?" (flecks, streaks, tablespoons, etc.)     no 7. CARDIAC HISTORY: "Do you have any history of heart disease?" (e.g., heart attack, congestive heart failure)      CHF 8. LUNG HISTORY: "Do you have any history of lung disease?"  (e.g., pulmonary embolus, asthma, emphysema)     no 9. PE RISK FACTORS: "Do you have a history of blood clots?" (or: recent major surgery, recent prolonged travel, bedridden)     Uses Eliquis 10. OTHER SYMPTOMS: "Do you have any other symptoms?" (e.g., runny nose, wheezing, chest pain)       When breathes patient feels rattling 11. PREGNANCY: "Is there any chance you are pregnant?" "When was your last menstrual period?"       n/a 12. TRAVEL: "Have you traveled out of the country in the last  month?" (e.g., travel history, exposures)       n/a  Protocols used: [place]

## 2018-06-09 ENCOUNTER — Ambulatory Visit (INDEPENDENT_AMBULATORY_CARE_PROVIDER_SITE_OTHER): Payer: Medicare Other | Admitting: Family Medicine

## 2018-06-09 ENCOUNTER — Encounter: Payer: Self-pay | Admitting: Family Medicine

## 2018-06-09 VITALS — BP 122/84 | HR 80 | Temp 98.0°F | Ht 77.0 in | Wt 320.5 lb

## 2018-06-09 DIAGNOSIS — J40 Bronchitis, not specified as acute or chronic: Secondary | ICD-10-CM

## 2018-06-09 MED ORDER — PREDNISONE 20 MG PO TABS: 40.0000 mg | ORAL_TABLET | Freq: Every day | ORAL | 0 refills | Status: AC

## 2018-06-09 MED ORDER — AZITHROMYCIN 250 MG PO TABS: ORAL_TABLET | ORAL | 0 refills | Status: DC

## 2018-06-09 NOTE — Patient Instructions (Signed)
Continue to push fluids, practice good hand hygiene, and cover your mouth if you cough.  If you start having fevers, shaking or shortness of breath, seek immediate care.  For symptoms, consider using Vick's VapoRub on chest or under nose, air humidifier, Benadryl at night, and elevating the head of the bed. Tylenol and ibuprofen for aches and pains you may be experiencing.   Give it 24 hours before taking azithromycin.   Let us know if you need anything.

## 2018-06-09 NOTE — Progress Notes (Addendum)
Chief Complaint  Patient presents with  . productive cough    cough started last Sunday. Parkinson's and Addison's disease., C/o productive cough tinged with blood. C/o sinus congestion. Phlegm is blackish/dark brown. Congestion in his chest. C/o chills and fever 101. He has tried taking Coricidin HBP, alka seltzer cold. Sx are worse in the evenings. Sx have improved since they started. C/o wheezing. Uses CPAP. No asthma.     Dennis Nations Nesby Sr. here for URI complaints.  Duration: 6 days  Associated symptoms: Fever, sinus congestion, rhinorrhea, wheezing, shortness of breath and productive cough Denies: sinus pain, itchy watery eyes, ear pain, ear drainage and sore throat Treatment to date: OTC meds Sick contacts: No  ROS:  Const: Denies current fevers HEENT: As noted in HPI Lungs: +cough  Past Medical History:  Diagnosis Date  . Addison's disease ([place]) 01/2016  . Arthritis    low back - DDD  . Cellulitis, scrotum 08/02/2014  . Chronic diastolic CHF (congestive heart failure) ([place])    Echo 12/18: severe LVH, EF 60-65, Gr 1 DD // Echo 5/18: EF 65-70, Gr 1 DD  . Chronic lower back pain    "from [place] 2007"  . Complication of anesthesia    Sometimes has N&V /w anesth.   . Coronary artery disease    NSTEMI >> LHC 9/01: prox and mid LAD 50-70; mRCA 40 >> med Rx // Nuc 8/13 [facility]):  no infarct or ischemia, EF 59  . Elevated PSA   . Epididymitis, left 08/04/2014  . History of chronic bronchitis   . History of gout   . History of stroke 01/22/2016   2004 - "right brain stem; no residual " // 2017  . Hypertension   . Hypocholesteremia   . Hypothyroidism   . Infection of skin due to methicillin resistant Staphylococcus aureus (MRSA) 12/28/2017  . Kidney stone   . Myocardial infarction [facility]) 2001   2001- cardiac cath., cardiac clearanece note dr Otho Perl 05-14-13 on chart, stress test results 02-21-12 on chart  . OSA on CPAP    cpap setting of 10  . Parkinson's disease ([place])    stage 4    . Pneumonia 2000's and 2013  . PONV (postoperative nausea and vomiting)   . [facility] spotted fever   . Testicular cancer ([place]) 2015    BP 122/84 (BP Location: Left Arm, Patient Position: Sitting, Cuff Size: Large)   Pulse 80   Temp 98 F (36.7 C) (Oral)   Ht 6\' 5"  (1.956 m)   Wt (!) 320 lb 8 oz (145.4 kg)   SpO2 96%   BMI 38.01 kg/m  General: Awake, alert, appears stated age HEENT: AT, [place], ears patent b/l and TMs neg, nares patent w/o discharge, pharynx pink and without exudates, MMM Neck: No masses or asymmetry Heart: RRR Lungs: +wheezing at bases, +transmitted upper airway sounds, no accessory muscle use Psych: Age appropriate judgment and insight, normal mood and affect  Wheezy bronchitis - Plan: azithromycin (ZITHROMAX) 250 MG tablet, predniSONE (DELTASONE) 20 MG tablet  Cont home Cortef. Add pred. If no improvement over next 24-48 hrs, OK to take Zpak.  Continue to push fluids, practice good hand hygiene, cover mouth when coughing. F/u prn. If starting to experience fevers, shaking, or shortness of breath, seek immediate care. Pt voiced understanding and agreement to the plan.  [place], DO 06/09/18 10:01 AM

## 2018-06-10 NOTE — Telephone Encounter (Signed)
Noted. Patient with appointment tomorrow.

## 2018-06-11 ENCOUNTER — Ambulatory Visit (INDEPENDENT_AMBULATORY_CARE_PROVIDER_SITE_OTHER): Payer: Medicare Other | Admitting: Family Medicine

## 2018-06-11 ENCOUNTER — Other Ambulatory Visit: Payer: Self-pay | Admitting: Family Medicine

## 2018-06-11 ENCOUNTER — Encounter: Payer: Self-pay | Admitting: Family Medicine

## 2018-06-11 ENCOUNTER — Telehealth: Payer: Self-pay | Admitting: *Deleted

## 2018-06-11 VITALS — BP 134/80 | HR 101 | Temp 97.9°F | Ht 77.0 in | Wt 307.0 lb

## 2018-06-11 DIAGNOSIS — R062 Wheezing: Secondary | ICD-10-CM

## 2018-06-11 DIAGNOSIS — R0602 Shortness of breath: Secondary | ICD-10-CM

## 2018-06-11 DIAGNOSIS — R05 Cough: Secondary | ICD-10-CM | POA: Diagnosis not present

## 2018-06-11 DIAGNOSIS — J22 Unspecified acute lower respiratory infection: Secondary | ICD-10-CM

## 2018-06-11 DIAGNOSIS — R251 Tremor, unspecified: Secondary | ICD-10-CM

## 2018-06-11 DIAGNOSIS — R059 Cough, unspecified: Secondary | ICD-10-CM

## 2018-06-11 DIAGNOSIS — I6381 Other cerebral infarction due to occlusion or stenosis of small artery: Secondary | ICD-10-CM | POA: Diagnosis not present

## 2018-06-11 DIAGNOSIS — I10 Essential (primary) hypertension: Secondary | ICD-10-CM | POA: Diagnosis not present

## 2018-06-11 DIAGNOSIS — F458 Other somatoform disorders: Secondary | ICD-10-CM | POA: Diagnosis not present

## 2018-06-11 MED ORDER — ALBUTEROL SULFATE (2.5 MG/3ML) 0.083% IN NEBU: 2.5000 mg | INHALATION_SOLUTION | Freq: Four times a day (QID) | RESPIRATORY_TRACT | 12 refills | Status: DC | PRN

## 2018-06-11 MED ORDER — POTASSIUM CHLORIDE ER 10 MEQ PO TBCR: 10.0000 meq | EXTENDED_RELEASE_TABLET | Freq: Every day | ORAL | 3 refills | Status: AC
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
## 2018-06-11 NOTE — Patient Instructions (Addendum)
Good to see you today  I think you are getting better from your upper respiratory infection  Can take Mucinex DM for cough and to loosen sputum  Use your albuterol nebulizer every 4-6 hours as needed for wheezing/cough, chest tightness  Let me know if you aren't feeling better in 2-4 days

## 2018-06-11 NOTE — Telephone Encounter (Signed)
Spoke to pt who states he attempted to have albuterol Rx filled but [facility] states that a Dx code is required on the prescription. I advised pt that the Dx was on the original Rx, but he states the actual Dx code has to be included in order for it to be covered.

## 2018-06-11 NOTE — Telephone Encounter (Signed)
Prescription resubmitted

## 2018-06-13 ENCOUNTER — Ambulatory Visit (INDEPENDENT_AMBULATORY_CARE_PROVIDER_SITE_OTHER): Payer: Medicare Other

## 2018-06-13 DIAGNOSIS — I639 Cerebral infarction, unspecified: Secondary | ICD-10-CM

## 2018-06-14 NOTE — Progress Notes (Signed)
Carelink Summary Report / Loop Recorder 

## 2018-06-25 ENCOUNTER — Other Ambulatory Visit: Payer: Self-pay

## 2018-06-25 ENCOUNTER — Telehealth: Payer: Self-pay | Admitting: Endocrinology

## 2018-06-25 MED ORDER — HYDROCORTISONE 10 MG PO TABS: ORAL_TABLET | ORAL | 0 refills | Status: DC

## 2018-06-25 NOTE — Telephone Encounter (Signed)
rx sent

## 2018-06-25 NOTE — Telephone Encounter (Signed)
MEDICATION: hydrocortisone (CORTEF) 10 MG tablet  PHARMACY:  [id number] - [place], [place]  IS THIS A 90 DAY SUPPLY : Yes  IS PATIENT OUT OF MEDICATION: Yes  IF NOT; HOW MUCH IS LEFT:   LAST APPOINTMENT DATE: @10 /31/2019  NEXT APPOINTMENT DATE:@1 /23/2020  DO WE HAVE YOUR PERMISSION TO LEAVE A DETAILED MESSAGE:  OTHER COMMENTS:  Patient states PCP would like Dr. Dwyane Dee to continue feeling this RX. Please Advise with patient on why.   **Let patient know to contact pharmacy at the end of the day to make sure medication is ready. **  ** Please notify patient to allow 48-72 hours to process**  **Encourage patient to contact the pharmacy for refills or they can request refills through [facility]**

## 2018-07-15 LAB — CUP PACEART REMOTE DEVICE CHECK
Date Time Interrogation Session: 20191204173728
Implantable Pulse Generator Implant Date: 20170719

## 2018-07-16 ENCOUNTER — Ambulatory Visit (INDEPENDENT_AMBULATORY_CARE_PROVIDER_SITE_OTHER): Payer: Medicare Other

## 2018-07-16 ENCOUNTER — Encounter: Payer: Self-pay | Admitting: Family Medicine

## 2018-07-16 ENCOUNTER — Encounter (HOSPITAL_COMMUNITY): Payer: Self-pay

## 2018-07-16 ENCOUNTER — Ambulatory Visit (HOSPITAL_COMMUNITY)
Admission: RE | Admit: 2018-07-16 | Discharge: 2018-07-16 | Disposition: A | Discharge status: Home / Self Care | Payer: Medicare Other | Source: Ambulatory Visit | Attending: Cardiovascular Disease | Admitting: Cardiovascular Disease

## 2018-07-16 ENCOUNTER — Ambulatory Visit (INDEPENDENT_AMBULATORY_CARE_PROVIDER_SITE_OTHER): Payer: Medicare Other | Admitting: Family Medicine

## 2018-07-16 VITALS — BP 110/82 | HR 75 | Temp 98.7°F | Ht 77.0 in | Wt 307.0 lb

## 2018-07-16 DIAGNOSIS — M79604 Pain in right leg: Secondary | ICD-10-CM

## 2018-07-16 DIAGNOSIS — I639 Cerebral infarction, unspecified: Secondary | ICD-10-CM

## 2018-07-16 LAB — CUP PACEART REMOTE DEVICE CHECK
Date Time Interrogation Session: 20200106170019
Implantable Pulse Generator Implant Date: 20170719

## 2018-07-16 MED ORDER — PREDNISONE 5 MG PO TABS: ORAL_TABLET | ORAL | 0 refills | Status: DC
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
## 2018-07-16 NOTE — Assessment & Plan Note (Signed)
Concern for clot given suspected findings on ultrasound and acuteness of his symptoms.  He reports not missing any Eliquis doses.  Also possible could be associated with sciatica.  Has a history of multiple surgeries - doppler today. Received call that is negative for clot - possible symptoms are related to sciatica. Will send in prednisone  - given indications to follow up.

## 2018-07-16 NOTE — Patient Instructions (Signed)
Good to see you  Please get the doppler performed  They will call me after it is completed.

## 2018-07-16 NOTE — Progress Notes (Signed)
Right lower extremity venous duplex performed. No evidence of deep or superficial thrombosis of the right lower extremity. Preliminary report called to Dr. Doristine Locks nurse, [place]. Patient talked directly to provider and given further instructions.

## 2018-07-17 NOTE — Progress Notes (Signed)
Carelink Summary Report / Loop Recorder 

## 2018-07-19 ENCOUNTER — Emergency Department (HOSPITAL_COMMUNITY)
Admission: EM | Admit: 2018-07-19 | Discharge: 2018-07-19 | Disposition: A | Discharge status: Home / Self Care | Payer: Medicare Other | Attending: Emergency Medicine | Admitting: Emergency Medicine

## 2018-07-19 ENCOUNTER — Emergency Department (HOSPITAL_COMMUNITY): Payer: Medicare Other

## 2018-07-19 ENCOUNTER — Encounter (HOSPITAL_COMMUNITY): Payer: Self-pay | Admitting: Emergency Medicine

## 2018-07-19 ENCOUNTER — Other Ambulatory Visit: Payer: Self-pay

## 2018-07-19 DIAGNOSIS — M545 Low back pain: Secondary | ICD-10-CM | POA: Diagnosis present

## 2018-07-19 DIAGNOSIS — I110 Hypertensive heart disease with heart failure: Secondary | ICD-10-CM | POA: Insufficient documentation

## 2018-07-19 DIAGNOSIS — I2510 Atherosclerotic heart disease of native coronary artery without angina pectoris: Secondary | ICD-10-CM | POA: Insufficient documentation

## 2018-07-19 DIAGNOSIS — R600 Localized edema: Secondary | ICD-10-CM | POA: Diagnosis not present

## 2018-07-19 DIAGNOSIS — M5431 Sciatica, right side: Secondary | ICD-10-CM | POA: Insufficient documentation

## 2018-07-19 DIAGNOSIS — Z7901 Long term (current) use of anticoagulants: Secondary | ICD-10-CM | POA: Insufficient documentation

## 2018-07-19 DIAGNOSIS — Z8547 Personal history of malignant neoplasm of testis: Secondary | ICD-10-CM | POA: Diagnosis not present

## 2018-07-19 DIAGNOSIS — Z79899 Other long term (current) drug therapy: Secondary | ICD-10-CM | POA: Insufficient documentation

## 2018-07-19 DIAGNOSIS — M5416 Radiculopathy, lumbar region: Secondary | ICD-10-CM | POA: Diagnosis not present

## 2018-07-19 DIAGNOSIS — E039 Hypothyroidism, unspecified: Secondary | ICD-10-CM | POA: Diagnosis not present

## 2018-07-19 DIAGNOSIS — I5032 Chronic diastolic (congestive) heart failure: Secondary | ICD-10-CM | POA: Insufficient documentation

## 2018-07-19 DIAGNOSIS — R531 Weakness: Secondary | ICD-10-CM | POA: Diagnosis not present

## 2018-07-19 DIAGNOSIS — R52 Pain, unspecified: Secondary | ICD-10-CM | POA: Diagnosis not present

## 2018-07-19 DIAGNOSIS — G20 Parkinson's disease: Secondary | ICD-10-CM | POA: Insufficient documentation

## 2018-07-19 DIAGNOSIS — Z96652 Presence of left artificial knee joint: Secondary | ICD-10-CM | POA: Diagnosis not present

## 2018-07-19 DIAGNOSIS — Z8673 Personal history of transient ischemic attack (TIA), and cerebral infarction without residual deficits: Secondary | ICD-10-CM | POA: Diagnosis not present

## 2018-07-19 DIAGNOSIS — R0902 Hypoxemia: Secondary | ICD-10-CM | POA: Diagnosis not present

## 2018-07-19 LAB — BASIC METABOLIC PANEL
Anion gap: 6 (ref 5–15)
BUN: 20 mg/dL (ref 8–23)
CO2: 26 mmol/L (ref 22–32)
Calcium: 9.2 mg/dL (ref 8.9–10.3)
Chloride: 103 mmol/L (ref 98–111)
Creatinine, Ser: 1.09 mg/dL (ref 0.61–1.24)
GFR calc Af Amer: >60 mL/min (ref >60)
GFR calc non Af Amer: >60 mL/min (ref >60)
Glucose, Bld: 130 mg/dL — ABNORMAL HIGH (ref 70–99)
Potassium: 3.5 mmol/L (ref 3.5–5.1)
Sodium: 135 mmol/L (ref 135–145)

## 2018-07-19 LAB — CBC WITH DIFFERENTIAL/PLATELET
Abs Immature Granulocytes: 0.04 10*3/uL (ref 0.00–0.07)
Basophils Absolute: 0 10*3/uL (ref 0.0–0.1)
Basophils Relative: 0 %
Eosinophils Absolute: 0 10*3/uL (ref 0.0–0.5)
Eosinophils Relative: 0 %
HCT: 47.9 % (ref 39.0–52.0)
Hemoglobin: 15.3 g/dL (ref 13.0–17.0)
Immature Granulocytes: 1 %
Lymphocytes Relative: 13 %
Lymphs Abs: 1 10*3/uL (ref 0.7–4.0)
MCH: 28.4 pg (ref 26.0–34.0)
MCHC: 31.9 g/dL (ref 30.0–36.0)
MCV: 89 fL (ref 80.0–100.0)
Monocytes Absolute: 0.5 10*3/uL (ref 0.1–1.0)
Monocytes Relative: 6 %
Neutro Abs: 6.7 10*3/uL (ref 1.7–7.7)
Neutrophils Relative %: 80 %
Platelets: 171 10*3/uL (ref 150–400)
RBC: 5.38 MIL/uL (ref 4.22–5.81)
RDW: 13.8 % (ref 11.5–15.5)
WBC: 8.3 10*3/uL (ref 4.0–10.5)
nRBC: 0 % (ref 0.0–0.2)

## 2018-07-19 MED ORDER — GADOBUTROL 1 MMOL/ML IV SOLN
10.0000 mL | Freq: Once | INTRAVENOUS | Status: AC | PRN
  Administered 2018-07-19: 10 mL via INTRAVENOUS

## 2018-07-19 MED ORDER — OXYCODONE-ACETAMINOPHEN 5-325 MG PO TABS
2.0000 | ORAL_TABLET | Freq: Once | ORAL | Status: AC
  Administered 2018-07-19: 2 via ORAL
  Filled 2018-07-19: qty 2

## 2018-07-19 MED ORDER — OXYCODONE-ACETAMINOPHEN 5-325 MG PO TABS: 1.0000 | ORAL_TABLET | Freq: Four times a day (QID) | ORAL | 0 refills | Status: DC | PRN

## 2018-07-19 NOTE — Discharge Instructions (Addendum)
You were evaluated in the emergency department for worsening right-sided sciatic pain.  Your MRI did not show any acute surgical process.  Reviewed this with neurosurgery and they felt you should keep your appointment in the office.  We are prescribing you some pain medication to help until you are able to see neurosurgery.  Please return if any worsening neurologic symptoms as bowel or bladder incontinence or weakness.

## 2018-07-19 NOTE — ED Notes (Signed)
Bladder scan 291 cc highest volume

## 2018-07-19 NOTE — ED Notes (Signed)
Reviewed d/c instructions with pt, who verbalized understanding and had no outstanding questions. Pt departed in NAD.   

## 2018-07-19 NOTE — ED Triage Notes (Addendum)
Patient presents to the ED by EMS with c/o  severe back pain in the lowrer back with radiation down the right leg x1 week. Recently screened for dvt which was negative. Pain persists. No SOB/Chx pain.   Fentanyl 100 mcg given in route.
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
## 2018-07-23 DIAGNOSIS — M5416 Radiculopathy, lumbar region: Secondary | ICD-10-CM | POA: Diagnosis not present

## 2018-07-30 DIAGNOSIS — M5416 Radiculopathy, lumbar region: Secondary | ICD-10-CM | POA: Diagnosis not present

## 2018-07-30 DIAGNOSIS — M7061 Trochanteric bursitis, right hip: Secondary | ICD-10-CM | POA: Diagnosis not present

## 2018-07-30 DIAGNOSIS — M1711 Unilateral primary osteoarthritis, right knee: Secondary | ICD-10-CM | POA: Diagnosis not present

## 2018-08-02 ENCOUNTER — Other Ambulatory Visit (INDEPENDENT_AMBULATORY_CARE_PROVIDER_SITE_OTHER): Payer: Medicare Other

## 2018-08-02 DIAGNOSIS — K529 Noninfective gastroenteritis and colitis, unspecified: Secondary | ICD-10-CM

## 2018-08-02 DIAGNOSIS — E230 Hypopituitarism: Secondary | ICD-10-CM | POA: Diagnosis not present

## 2018-08-02 LAB — CBC
HCT: 47 % (ref 39.0–52.0)
Hemoglobin: 15.6 g/dL (ref 13.0–17.0)
MCHC: 33.2 g/dL (ref 30.0–36.0)
MCV: 87.2 fl (ref 78.0–100.0)
Platelets: 243 10*3/uL (ref 150.0–400.0)
RBC: 5.39 Mil/uL (ref 4.22–5.81)
RDW: 14.7 % (ref 11.5–15.5)
WBC: 9 10*3/uL (ref 4.0–10.5)

## 2018-08-02 LAB — TESTOSTERONE: Testosterone: 177.48 ng/dL — ABNORMAL LOW (ref 300.00–890.00)

## 2018-08-03 DIAGNOSIS — M1A09X1 Idiopathic chronic gout, multiple sites, with tophus (tophi): Secondary | ICD-10-CM | POA: Diagnosis not present

## 2018-08-03 DIAGNOSIS — Z6841 Body Mass Index (BMI) 40.0 and over, adult: Secondary | ICD-10-CM | POA: Diagnosis not present

## 2018-08-03 DIAGNOSIS — M150 Primary generalized (osteo)arthritis: Secondary | ICD-10-CM | POA: Diagnosis not present

## 2018-08-07 ENCOUNTER — Ambulatory Visit (INDEPENDENT_AMBULATORY_CARE_PROVIDER_SITE_OTHER): Payer: Medicare Other | Admitting: Endocrinology

## 2018-08-07 ENCOUNTER — Encounter: Payer: Self-pay | Admitting: Endocrinology

## 2018-08-07 VITALS — BP 110/80 | HR 88 | Ht 77.0 in | Wt 332.4 lb

## 2018-08-07 DIAGNOSIS — I639 Cerebral infarction, unspecified: Secondary | ICD-10-CM

## 2018-08-07 DIAGNOSIS — E230 Hypopituitarism: Secondary | ICD-10-CM
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
## 2018-08-07 NOTE — Patient Instructions (Signed)
Injections to be 0.6mg  every 2 weeks

## 2018-08-20 ENCOUNTER — Ambulatory Visit (INDEPENDENT_AMBULATORY_CARE_PROVIDER_SITE_OTHER): Payer: Medicare Other

## 2018-08-20 DIAGNOSIS — I639 Cerebral infarction, unspecified: Secondary | ICD-10-CM

## 2018-08-20 LAB — CUP PACEART REMOTE DEVICE CHECK
Date Time Interrogation Session: 20200208183640
Implantable Pulse Generator Implant Date: 20170719

## 2018-08-24 ENCOUNTER — Encounter: Payer: Self-pay | Admitting: Family Medicine

## 2018-08-28 ENCOUNTER — Encounter: Payer: Self-pay | Admitting: Family Medicine

## 2018-08-30 NOTE — Progress Notes (Signed)
Carelink Summary Report / Loop Recorder 

## 2018-08-31 ENCOUNTER — Encounter: Payer: Self-pay | Admitting: Family Medicine

## 2018-09-07 ENCOUNTER — Ambulatory Visit (INDEPENDENT_AMBULATORY_CARE_PROVIDER_SITE_OTHER): Payer: Medicare Other | Admitting: Family Medicine

## 2018-09-07 ENCOUNTER — Ambulatory Visit (INDEPENDENT_AMBULATORY_CARE_PROVIDER_SITE_OTHER): Payer: Medicare Other

## 2018-09-07 ENCOUNTER — Ambulatory Visit: Payer: Medicare Other | Admitting: Family Medicine

## 2018-09-07 ENCOUNTER — Encounter: Payer: Self-pay | Admitting: Family Medicine

## 2018-09-07 VITALS — BP 130/80 | HR 69 | Temp 97.4°F | Ht 76.5 in | Wt 342.7 lb

## 2018-09-07 DIAGNOSIS — Z7952 Long term (current) use of systemic steroids: Secondary | ICD-10-CM

## 2018-09-07 DIAGNOSIS — Z0000 Encounter for general adult medical examination without abnormal findings: Secondary | ICD-10-CM | POA: Diagnosis not present

## 2018-09-07 DIAGNOSIS — F341 Dysthymic disorder: Secondary | ICD-10-CM

## 2018-09-07 DIAGNOSIS — R29890 Loss of height: Secondary | ICD-10-CM | POA: Diagnosis not present

## 2018-09-07 DIAGNOSIS — M5441 Lumbago with sciatica, right side: Secondary | ICD-10-CM

## 2018-09-07 DIAGNOSIS — G8929 Other chronic pain: Secondary | ICD-10-CM

## 2018-09-07 DIAGNOSIS — Z13820 Encounter for screening for osteoporosis: Secondary | ICD-10-CM | POA: Diagnosis not present

## 2018-09-07 DIAGNOSIS — I639 Cerebral infarction, unspecified: Secondary | ICD-10-CM

## 2018-09-07 DIAGNOSIS — M5442 Lumbago with sciatica, left side: Secondary | ICD-10-CM

## 2018-09-07 MED ORDER — DULOXETINE HCL 30 MG PO CPEP: 30.0000 mg | ORAL_CAPSULE | Freq: Every day | ORAL | 5 refills | Status: DC

## 2018-09-07 NOTE — Progress Notes (Signed)
PCP notes:   Health maintenance:  No gaps identified.   Abnormal screenings:   Hearing - failed  Hearing Screening   125Hz  250Hz  500Hz  1000Hz  2000Hz  3000Hz  4000Hz  6000Hz  8000Hz   Right ear:   40 40 40  0    Left ear:   40 40 0  0    Vision Screening Comments: Vision exam in Nov 19 with Dr. Alain Honey  Patient concerns:   Patient desires to have Zoloft 100 mg refilled.  Patient desires to have medication for arthritic pain.  Nurse concerns:  None
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
## 2018-09-07 NOTE — Patient Instructions (Signed)
Good to see you today  Take 1/2 tablet sertraline (Zoloft) daily for 7 days. Switch to duloxetine one tablet daily. Starting you out at low dose. If you get to end of first prescription and feel like you need a higher dose, let me know and I'll send in a new prescription for the next dose  Follow up in 6 months or sooner if needed
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
## 2018-09-07 NOTE — Progress Notes (Signed)
PCP notes:   Health maintenance:   Abnormal screenings:   Hearing - failed  Hearing Screening   125Hz  250Hz  500Hz  1000Hz  2000Hz  3000Hz  4000Hz  6000Hz  8000Hz   Right ear:   40 40 40  0    Left ear:   40 40 0  0    Vision Screening Comments: Vision exam in Nov 2019 with Dr. Alain Honey   Patient concerns:   Refill for Zoloft 100 mg requested.  Patient is concerned with arthritic pain  Nurse concerns:  None  Next PCP appt:   09/07/18 @ 1115

## 2018-09-07 NOTE — Patient Instructions (Signed)
Dennis [name] , Thank you for taking time to come for your Medicare Wellness Visit. I appreciate your ongoing commitment to your health goals. Please review the following plan we discussed and let me know if I can assist you in the future.   These are the goals we discussed: Goals    . Patient Stated     Starting 09/07/18, I will continue to take medications as prescribed.        This is a list of the screening recommended for you and due dates:  Health Maintenance  Topic Date Due  . Tetanus Vaccine  10/02/2019  . Colon Cancer Screening  03/02/2028  . Flu Shot  Completed  .  Hepatitis C: One time screening is recommended by Center for Disease Control  (CDC) for  adults born from [birth year] through 1965.   Completed  . Pneumonia vaccines  Completed   Preventive Care for Adults  A healthy lifestyle and preventive care can promote health and wellness. Preventive health guidelines for adults include the following key practices.  . A routine yearly physical is a good way to check with your health care provider about your health and preventive screening. It is a chance to share any concerns and updates on your health and to receive a thorough exam.  . Visit your dentist for a routine exam and preventive care every 6 months. Brush your teeth twice a day and floss once a day. Good oral hygiene prevents tooth decay and gum disease.  . The frequency of eye exams is based on your age, health, family medical history, use  of contact lenses, and other factors. Follow your health care provider's recommendations for frequency of eye exams.  . Eat a healthy diet. Foods like vegetables, fruits, whole grains, low-fat dairy products, and lean protein foods contain the nutrients you need without too many calories. Decrease your intake of foods high in solid fats, added sugars, and salt. Eat the right amount of calories for you. Get information about a proper diet from your health care provider, if necessary.  .  Regular physical exercise is one of the most important things you can do for your health. Most adults should get at least 150 minutes of moderate-intensity exercise (any activity that increases your heart rate and causes you to sweat) each week. In addition, most adults need muscle-strengthening exercises on 2 or more days a week.  Silver Sneakers may be a benefit available to you. To determine eligibility, you may visit the website: www.silversneakers.com or contact program at 2096863653 Mon-Fri between 8AM-8PM.   . Maintain a healthy weight. The body mass index (BMI) is a screening tool to identify possible weight problems. It provides an estimate of body fat based on height and weight. Your health care provider can find your BMI and can help you achieve or maintain a healthy weight.   For adults 20 years and older: ? A BMI below 18.5 is considered underweight. ? A BMI of 18.5 to 24.9 is normal. ? A BMI of 25 to 29.9 is considered overweight. ? A BMI of 30 and above is considered obese.   . Maintain normal blood lipids and cholesterol levels by exercising and minimizing your intake of saturated fat. Eat a balanced diet with plenty of fruit and vegetables. Blood tests for lipids and cholesterol should begin at age 61 and be repeated every 5 years. If your lipid or cholesterol levels are high, you are over 50, or you are at high risk for  heart disease, you may need your cholesterol levels checked more frequently. Ongoing high lipid and cholesterol levels should be treated with medicines if diet and exercise are not working.  . If you smoke, find out from your health care provider how to quit. If you do not use tobacco, please do not start.  . If you choose to drink alcohol, please do not consume more than 2 drinks per day. One drink is considered to be 12 ounces (355 mL) of beer, 5 ounces (148 mL) of wine, or 1.5 ounces (44 mL) of liquor.  . If you are 35-66 years old, ask your health care  provider if you should take aspirin to prevent strokes.  . Use sunscreen. Apply sunscreen liberally and repeatedly throughout the day. You should seek shade when your shadow is shorter than you. Protect yourself by wearing long sleeves, pants, a wide-brimmed hat, and sunglasses year round, whenever you are outdoors.  . Once a month, do a whole body skin exam, using a mirror to look at the skin on your back. Tell your health care provider of new moles, moles that have irregular borders, moles that are larger than a pencil eraser, or moles that have changed in shape or color.

## 2018-09-09 NOTE — Progress Notes (Signed)
I reviewed health advisor's note, was available for consultation, and agree with documentation and plan.  

## 2018-09-20 ENCOUNTER — Ambulatory Visit (INDEPENDENT_AMBULATORY_CARE_PROVIDER_SITE_OTHER): Payer: Medicare Other | Admitting: *Deleted

## 2018-09-20 DIAGNOSIS — I639 Cerebral infarction, unspecified: Secondary | ICD-10-CM

## 2018-09-23 LAB — CUP PACEART REMOTE DEVICE CHECK
Date Time Interrogation Session: 20200312184001
Implantable Pulse Generator Implant Date: 20170719

## 2018-09-27 NOTE — Progress Notes (Signed)
Carelink Summary Report / Loop Recorder 

## 2018-10-02 ENCOUNTER — Encounter: Payer: Self-pay | Admitting: Family Medicine

## 2018-10-03 ENCOUNTER — Other Ambulatory Visit: Payer: Self-pay

## 2018-10-03 ENCOUNTER — Encounter: Payer: Self-pay | Admitting: Family Medicine

## 2018-10-03 ENCOUNTER — Ambulatory Visit (INDEPENDENT_AMBULATORY_CARE_PROVIDER_SITE_OTHER): Payer: Medicare Other | Admitting: Family Medicine

## 2018-10-03 VITALS — BP 112/72 | HR 104 | Temp 97.7°F | Ht 76.5 in | Wt 348.4 lb

## 2018-10-03 DIAGNOSIS — R300 Dysuria: Secondary | ICD-10-CM

## 2018-10-03 DIAGNOSIS — N10 Acute tubulo-interstitial nephritis: Secondary | ICD-10-CM

## 2018-10-03 DIAGNOSIS — I639 Cerebral infarction, unspecified: Secondary | ICD-10-CM | POA: Diagnosis not present

## 2018-10-03 LAB — POC URINALSYSI DIPSTICK (AUTOMATED)
Glucose, UA: NEGATIVE
Ketones, UA: NEGATIVE
Nitrite, UA: POSITIVE
Protein, UA: NEGATIVE
Spec Grav, UA: >=1.03 — AB (ref 1.010–1.025)
Urobilinogen, UA: 1 E.U./dL
pH, UA: 5 (ref 5.0–8.0)

## 2018-10-03 MED ORDER — SULFAMETHOXAZOLE-TRIMETHOPRIM 800-160 MG PO TABS: 1.0000 | ORAL_TABLET | Freq: Two times a day (BID) | ORAL | 0 refills | Status: AC

## 2018-10-03 MED ORDER — CEFTRIAXONE SODIUM 1 G IJ SOLR
1.0000 g | Freq: Once | INTRAMUSCULAR | Status: AC
  Administered 2018-10-03: 1 g via INTRAMUSCULAR
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
## 2018-10-03 NOTE — Patient Instructions (Addendum)
  Increase fluids  Let me know if you have any persistent vomiting, increased pain, fever over 100.5  I will notify you of culture results Pyelonephritis, Adult  Pyelonephritis is a kidney infection. The kidneys are organs that help clean your blood by moving waste out of your blood and into your pee (urine). This infection can happen quickly, or it can last for a long time. In most cases, it clears up with treatment and does not cause other problems. Follow these instructions at home: Medicines  Take over-the-counter and prescription medicines only as told by your doctor.  Take your antibiotic medicine as told by your doctor. Do not stop taking the medicine even if you start to feel better. General instructions  Drink enough fluid to keep your pee clear or pale yellow.  Avoid caffeine, tea, and carbonated drinks.  Pee (urinate) often. Avoid holding in pee for long periods of time.  Pee before and after sex.  After pooping (having a bowel movement), women should wipe from front to back. Use each tissue only once.  Keep all follow-up visits as told by your doctor. This is important. Contact a doctor if:  You do not feel better after 2 days.  Your symptoms get worse.  You have a fever. Get help right away if:  You cannot take your medicine or drink fluids as told.  You have chills and shaking.  You throw up (vomit).  You have very bad pain in your side (flank) or back.  You feel very weak or you pass out (faint). This information is not intended to replace advice given to you by your health care provider. Make sure you discuss any questions you have with your health care provider. Document Released: 08/04/2004 Document Revised: 12/03/2015 Document Reviewed: 10/20/2014 Elsevier Interactive Patient Education  [employer].

## 2018-10-04 LAB — URINE CULTURE
MICRO NUMBER:: 352617
Result:: NO GROWTH
SPECIMEN QUALITY:: ADEQUATE

## 2018-10-05 ENCOUNTER — Encounter: Payer: Self-pay | Admitting: Family Medicine

## 2018-10-15 ENCOUNTER — Other Ambulatory Visit: Payer: Self-pay | Admitting: Family Medicine

## 2018-10-15 ENCOUNTER — Encounter: Payer: Self-pay | Admitting: Family Medicine

## 2018-10-15 NOTE — Progress Notes (Signed)
Bone density test expiration date extended due to COVID-19.

## 2018-10-19 ENCOUNTER — Other Ambulatory Visit: Payer: Self-pay | Admitting: Family Medicine

## 2018-10-19 NOTE — Telephone Encounter (Signed)
Requested medication (s) are due for refill today: yes  Requested medication (s) are on the active medication list:yes  Last refill:01/24/18  Future visit scheduled: yes  Notes to clinic: historical provider   Requested Prescriptions  Pending Prescriptions Disp Refills   colchicine 0.6 MG tablet [Pharmacy Med Name: COLCHICINE 0.6MG  TABLETS] 60 tablet 0    Sig: TAKE 1 TABLET BY MOUTH TWICE DAILY     There is no refill protocol information for this order

## 2018-10-20 ENCOUNTER — Encounter: Payer: Self-pay | Admitting: Family Medicine

## 2018-10-21 ENCOUNTER — Encounter: Payer: Self-pay | Admitting: Family Medicine

## 2018-10-22 ENCOUNTER — Other Ambulatory Visit: Payer: Self-pay | Admitting: Family Medicine

## 2018-10-22 ENCOUNTER — Encounter: Payer: Self-pay | Admitting: Family Medicine

## 2018-10-22 DIAGNOSIS — M1A00X1 Idiopathic chronic gout, unspecified site, with tophus (tophi): Secondary | ICD-10-CM

## 2018-10-22 MED ORDER — PREDNISONE 20 MG PO TABS: ORAL_TABLET | ORAL | 1 refills | Status: DC

## 2018-10-22 NOTE — Telephone Encounter (Signed)
mychart msg also sent about prednisone... it does not look like this has been filled in awhile... please advise

## 2018-10-23 ENCOUNTER — Ambulatory Visit (INDEPENDENT_AMBULATORY_CARE_PROVIDER_SITE_OTHER): Payer: Medicare Other | Admitting: *Deleted

## 2018-10-23 ENCOUNTER — Other Ambulatory Visit: Payer: Self-pay

## 2018-10-23 DIAGNOSIS — I639 Cerebral infarction, unspecified: Secondary | ICD-10-CM | POA: Diagnosis not present

## 2018-10-23 LAB — CUP PACEART REMOTE DEVICE CHECK
Date Time Interrogation Session: 20200414204030
Implantable Pulse Generator Implant Date: 20170719

## 2018-10-24 ENCOUNTER — Other Ambulatory Visit: Payer: Medicare Other

## 2018-10-30 NOTE — Progress Notes (Signed)
Carelink Summary Report / Loop Recorder 

## 2018-11-05 ENCOUNTER — Other Ambulatory Visit (INDEPENDENT_AMBULATORY_CARE_PROVIDER_SITE_OTHER): Payer: Medicare Other

## 2018-11-05 ENCOUNTER — Other Ambulatory Visit: Payer: Self-pay

## 2018-11-05 DIAGNOSIS — E230 Hypopituitarism: Secondary | ICD-10-CM

## 2018-11-05 LAB — TESTOSTERONE: Testosterone: 325.77 ng/dL (ref 300.00–890.00)

## 2018-11-06 LAB — TSH: TSH: 22.66 u[IU]/mL — ABNORMAL HIGH (ref 0.35–4.50)

## 2018-11-06 LAB — T4, FREE: Free T4: 0.56 ng/dL — ABNORMAL LOW (ref 0.60–1.60)

## 2018-11-07 ENCOUNTER — Other Ambulatory Visit: Payer: Self-pay | Admitting: Family Medicine

## 2018-11-07 ENCOUNTER — Encounter: Payer: Self-pay | Admitting: Family Medicine

## 2018-11-07 DIAGNOSIS — L03115 Cellulitis of right lower limb: Secondary | ICD-10-CM

## 2018-11-07 MED ORDER — CEPHALEXIN 500 MG PO CAPS: 500.0000 mg | ORAL_CAPSULE | Freq: Three times a day (TID) | ORAL | 0 refills | Status: DC

## 2018-11-07 MED ORDER — EPINEPHRINE 0.3 MG/0.3ML IJ SOAJ: 0.3000 mg | INTRAMUSCULAR | 0 refills | Status: DC | PRN

## 2018-11-08 ENCOUNTER — Ambulatory Visit (INDEPENDENT_AMBULATORY_CARE_PROVIDER_SITE_OTHER): Payer: Medicare Other | Admitting: Endocrinology

## 2018-11-08 ENCOUNTER — Encounter: Payer: Self-pay | Admitting: Endocrinology

## 2018-11-08 ENCOUNTER — Other Ambulatory Visit: Payer: Self-pay

## 2018-11-08 DIAGNOSIS — I639 Cerebral infarction, unspecified: Secondary | ICD-10-CM

## 2018-11-08 DIAGNOSIS — E039 Hypothyroidism, unspecified: Secondary | ICD-10-CM | POA: Diagnosis not present

## 2018-11-08 DIAGNOSIS — E230 Hypopituitarism: Secondary | ICD-10-CM
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
## 2018-11-11 ENCOUNTER — Other Ambulatory Visit: Payer: Self-pay | Admitting: Endocrinology

## 2018-11-14 ENCOUNTER — Other Ambulatory Visit: Payer: Self-pay

## 2018-11-14 ENCOUNTER — Other Ambulatory Visit: Payer: Medicare Other | Admitting: Student

## 2018-11-14 DIAGNOSIS — Z515 Encounter for palliative care: Secondary | ICD-10-CM

## 2018-11-14 NOTE — Progress Notes (Signed)
[facility] Consult Note Telephone: [phone]  Fax: [phone]  PATIENT NAME: Dennis Sainato Sr. DOB: Oct 13, 1945 MRN: [id number]  PRIMARY CARE PROVIDER:   [name], FNP  REFERRING PROVIDER:  Elby [name], [place], [place] [address]  RESPONSIBLE PARTY: Wife, Dennis [name]  ASSESSMENT: Due to the COVID-19 crisis, this visit was completed via telemedicine from my office and it was initiated and consent by this patient and or family. We discussed role of Palliative Medicine as a specialized medicine for people living with serious illness and providing symptom management. We discussed ongoing goals of care and symptom management. Palliative Medicine will continue to provide support and make recommendations as needed.     RECOMMENDATIONS and PLAN:  1. Code status: DNR.  2. Medical goals of therapy/ symptom management: Plan is to start therapy once therapy services resumes. Palliative Medicine will focus on symptom management and make recommendations as needed.   3. Discharge Planning: Dennis [name] will continue to reside at home with assistance from wife and son.  Palliative Medicine will follow up with phone call in 2 months or sooner, if needed.  I spent 15 minutes providing this consultation,  from 1:00pm to 1:15pm. More than 50% of the time in this consultation was spent coordinating communication.   HISTORY OF PRESENT ILLNESS:  Dennis Hollingshed Bracken Sr. is a 73 y.o. male with multiple medical problems including Parkinson's disease, CHF, Addison's disease, CVA, MI, sleep apnea, hypertension, hyperlipidemia, hypothyroidism, gout, history of testicular cancer and prostate cancer. Palliative Care was asked to help address goals of care; he is seen for follow up visit today. Dennis [name] states that he has been doing pretty well. He reports arthritic pain, but states this is managed. He denies shortness of breath, nausea or  constipation. He states his appetite is improving. He is sleeping well at night. He is using walker for ambulation. He states that the [place] had ordered therapy, but this is currently on hold due to the coronavirus. He has been following up with PCP and specialist. He was recently started on keflex for cellulitis to right lower extremity. He denies any other concerns or needs at this time.    CODE STATUS: DNR  PPS: 70% HOSPICE ELIGIBILITY/DIAGNOSIS: TBD  PAST MEDICAL HISTORY:  Past Medical History:  Diagnosis Date  . Addison's disease ([place]) 01/2016  . Arthritis    low back - DDD  . Cataract 2019   corrected with surgery  . Cellulitis, scrotum 08/02/2014  . Chronic diastolic CHF (congestive heart failure) ([place])    Echo 12/18: severe LVH, EF 60-65, Gr 1 DD // Echo 5/18: EF 65-70, Gr 1 DD  . Chronic lower back pain    "from [place] 2007"  . Complication of anesthesia    Sometimes has N&V /w anesth.   . Coronary artery disease    NSTEMI >> LHC 9/01: prox and mid LAD 50-70; mRCA 40 >> med Rx // Nuc 8/13 [facility]):  no infarct or ischemia, EF 59  . Elevated PSA   . Epididymitis, left 08/04/2014  . History of chronic bronchitis   . History of gout   . History of stroke 01/22/2016   2004 - "right brain stem; no residual " // 2017  . Hypertension   . Hypocholesteremia   . Hypothyroidism   . Infection of skin due to methicillin resistant Staphylococcus aureus (MRSA) 12/28/2017  . Kidney stone   . Myocardial infarction (  [facility]) 2001   2001- cardiac cath., cardiac clearanece note dr Otho Perl 05-14-13 on chart, stress test results 02-21-12 on chart  . OSA on CPAP    cpap setting of 10  . Parkinson's disease ([place])    stage 4   . Pneumonia 2000's and 2013  . PONV (postoperative nausea and vomiting)   . [facility] spotted fever   . Testicular cancer ([place]) 2015    SOCIAL HX:  Social History   Tobacco Use  . Smoking status: Never Smoker  . Smokeless tobacco: Never Used  Substance Use Topics  .  Alcohol use: Not Currently    Frequency: Never    ALLERGIES:  Allergies  Allergen Reactions  . Bee Venom Anaphylaxis  . Shrimp [Shellfish Allergy] Anaphylaxis and Other (See Comments)    "just shrimp"  . Stadol [Butorphanol] Anaphylaxis and Other (See Comments)    respiratory  Distress, couldn't breathe, cardiac arrest  . Wasp Venom Anaphylaxis       PHYSICAL EXAM:   General: NAD  Dennis Slocumb, NP

## 2018-11-15 ENCOUNTER — Telehealth: Payer: Self-pay

## 2018-11-15 ENCOUNTER — Ambulatory Visit (INDEPENDENT_AMBULATORY_CARE_PROVIDER_SITE_OTHER): Payer: Medicare Other | Admitting: Gastroenterology

## 2018-11-15 ENCOUNTER — Other Ambulatory Visit: Payer: Self-pay

## 2018-11-15 DIAGNOSIS — E2740 Unspecified adrenocortical insufficiency: Secondary | ICD-10-CM | POA: Diagnosis not present

## 2018-11-15 DIAGNOSIS — Z7901 Long term (current) use of anticoagulants: Secondary | ICD-10-CM | POA: Diagnosis not present

## 2018-11-15 DIAGNOSIS — K529 Noninfective gastroenteritis and colitis, unspecified: Secondary | ICD-10-CM

## 2018-11-15 NOTE — Patient Instructions (Addendum)
It has been recommended to you by your physician that you have a(n) Colonoscopy in 2 months completed. We did not schedule the procedure(s) today as schedule was not available. Please contact our office at [phone] at a later date to schedule procedure.   We will contact Dr.McAlhaney to get clearance to hold Eliquis 2 days prior to procedure.   We will contact Dr. Dwyane Dee office to ask about adjusting or increasing steroids for colonoscopy and  anesthesia due to adrenal insufficiency.     Thank you for choosing me and [facility].  Dr. Rush Landmark

## 2018-11-15 NOTE — Telephone Encounter (Signed)
   Primary Cardiologist:Christopher Angelena Form, MD  Chart reviewed as part of pre-operative protocol coverage. Because of Nickalas Mccarrick Ogando Sr.'s past medical history and time since last visit, he/she will require a follow-up visit in order to better assess preoperative cardiovascular risk.   Pre-op covering staff: - Please schedule appointment and call patient to inform them.  Please arrange a virtual VIDEO visit with an APP on [name] team.  - Please contact requesting surgeon's office via preferred method (i.e, phone, fax) to inform them of need for appointment prior to surgery.  If applicable, this message will also be routed to pharmacy pool and/or primary cardiologist for input on holding anticoagulant/antiplatelet agent as requested below so that this information is available at time of patient's appointment.   [place], PA  11/15/2018, 3:21 PM
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
## 2018-11-15 NOTE — Telephone Encounter (Signed)
Patient with diagnosis of afib on Eliquis for anticoagulation.    Procedure: Colonsocopy Date of procedure: TBD  CHADS2-VASc score of  5 (CHF, HTN, AGE, DM2, stroke/tia x 2, CAD, AGE, male)  CrCl 123ml/min  Due to patients history of multiple strokes, patient is at high risk off anticoagulation.  Per office protocol, patient can hold Eliquis for 1 day prior to procedure.

## 2018-11-15 NOTE — Telephone Encounter (Signed)
Request for surgical clearance:     Endoscopy Procedure  What type of surgery is being performed?     Colonoscopy  When is this surgery scheduled?     TBD  What type of clearance is required ?   Pharmacy   Please have Dr. Dwyane Dee to advise if steroids should be increased at time of colonoscopy and anesthesia due to Adrenal Insufficieny?  Practice name and name of physician performing surgery?      [facility]   What is your office phone and fax number?      Phone- [phone]  Fax- [phone] Attn: Mattea Seger-CCMA  Anesthesia type (None, local, MAC, general) ?       MAC

## 2018-11-15 NOTE — Telephone Encounter (Signed)
Request for surgical clearance:     Endoscopy Procedure  What type of surgery is being performed? Colonoscopy  When is this surgery scheduled?     TBD  What type of clearance is required ?   Pharmacy  Are there any medications that need to be held prior to surgery and how long? Eliquis 2 days prior to procedure.  Practice name and name of physician performing surgery?      [facility]  What is your office phone and fax number?      Phone- [phone]  Fax- [phone] Attn: [place]   Anesthesia type (None, local, MAC, general) ?       MAC

## 2018-11-16 ENCOUNTER — Encounter: Payer: Self-pay | Admitting: Gastroenterology

## 2018-11-16 NOTE — Telephone Encounter (Signed)
He will take 60 mg of hydrocortisone an hour before his planned procedure and additional 20 mg when returning home, he will resume the usual dose the next day

## 2018-11-18 DIAGNOSIS — K529 Noninfective gastroenteritis and colitis, unspecified: Secondary | ICD-10-CM | POA: Insufficient documentation

## 2018-11-18 DIAGNOSIS — Z7901 Long term (current) use of anticoagulants: Secondary | ICD-10-CM | POA: Insufficient documentation

## 2018-11-18 HISTORY — DX: Noninfective gastroenteritis and colitis, unspecified: K52.9

## 2018-11-19 NOTE — Telephone Encounter (Signed)
Dr. Dwyane Dee, Thank you for the update. When his procedure comes around we will ensure he gets his dosing. All the best. Valarie Merino

## 2018-11-19 NOTE — Telephone Encounter (Signed)
Please see recommendation from Dr. Dwyane Dee.

## 2018-11-19 NOTE — Telephone Encounter (Signed)
Called pt re: surgical clearance.  Per Pt, he didn't think the Colonoscopy was being performed for another 4 months or so.  Per pt, he didn't want to pursue with this until after the Covid19 calms down.  Pt said he was going to contact his GI and discuss again with them.  Pt understands that before the procedure is done, he will need an office visit in order to be cleared.  Pt verbalized understanding and states that he understood and would call to schedule an appt closer to when his procedure will be performed.  Will route this to the requesting surgeon's office and delete out of the preop pool.

## 2018-11-26 ENCOUNTER — Other Ambulatory Visit: Payer: Self-pay

## 2018-11-26 ENCOUNTER — Ambulatory Visit (INDEPENDENT_AMBULATORY_CARE_PROVIDER_SITE_OTHER): Payer: Medicare Other | Admitting: *Deleted

## 2018-11-26 DIAGNOSIS — I639 Cerebral infarction, unspecified: Secondary | ICD-10-CM | POA: Diagnosis not present

## 2018-11-26 LAB — CUP PACEART REMOTE DEVICE CHECK
Date Time Interrogation Session: 20200517215333
Implantable Pulse Generator Implant Date: 20170719

## 2018-11-28 DIAGNOSIS — M79641 Pain in right hand: Secondary | ICD-10-CM | POA: Diagnosis not present

## 2018-11-28 DIAGNOSIS — M10442 Other secondary gout, left hand: Secondary | ICD-10-CM | POA: Diagnosis not present

## 2018-12-04 NOTE — Progress Notes (Signed)
Carelink Summary Report / Loop Recorder 

## 2018-12-11 DIAGNOSIS — R2689 Other abnormalities of gait and mobility: Secondary | ICD-10-CM | POA: Diagnosis not present

## 2018-12-11 DIAGNOSIS — M6281 Muscle weakness (generalized): Secondary | ICD-10-CM | POA: Diagnosis not present

## 2018-12-13 ENCOUNTER — Encounter: Payer: Self-pay | Admitting: Family Medicine

## 2018-12-17 DIAGNOSIS — M6281 Muscle weakness (generalized): Secondary | ICD-10-CM | POA: Diagnosis not present

## 2018-12-17 DIAGNOSIS — R2689 Other abnormalities of gait and mobility: Secondary | ICD-10-CM | POA: Diagnosis not present

## 2018-12-25 DIAGNOSIS — M6281 Muscle weakness (generalized): Secondary | ICD-10-CM | POA: Diagnosis not present

## 2018-12-25 DIAGNOSIS — R2689 Other abnormalities of gait and mobility: Secondary | ICD-10-CM | POA: Diagnosis not present

## 2018-12-28 ENCOUNTER — Ambulatory Visit (INDEPENDENT_AMBULATORY_CARE_PROVIDER_SITE_OTHER): Payer: Medicare Other | Admitting: *Deleted

## 2018-12-28 DIAGNOSIS — I63312 Cerebral infarction due to thrombosis of left middle cerebral artery: Secondary | ICD-10-CM | POA: Diagnosis not present

## 2018-12-31 ENCOUNTER — Other Ambulatory Visit: Payer: Self-pay

## 2018-12-31 ENCOUNTER — Ambulatory Visit (INDEPENDENT_AMBULATORY_CARE_PROVIDER_SITE_OTHER): Payer: Medicare Other | Admitting: Family Medicine

## 2018-12-31 VITALS — BP 128/76 | HR 82 | Temp 98.9°F | Resp 22 | Ht 76.5 in | Wt 371.8 lb

## 2018-12-31 DIAGNOSIS — L03115 Cellulitis of right lower limb: Secondary | ICD-10-CM | POA: Diagnosis not present

## 2018-12-31 LAB — CUP PACEART REMOTE DEVICE CHECK
Date Time Interrogation Session: 20200619221022
Implantable Pulse Generator Implant Date: 20170719

## 2018-12-31 MED ORDER — CEPHALEXIN 500 MG PO CAPS: 500.0000 mg | ORAL_CAPSULE | Freq: Four times a day (QID) | ORAL | 0 refills | Status: AC

## 2018-12-31 NOTE — Patient Instructions (Signed)
Take the antibiotic  If the redness gets worse or is not improving over 1-2 days, would recommend follow-up appointment

## 2018-12-31 NOTE — Progress Notes (Signed)
   Subjective:     Dennis Tienda Ayyad Sr. is a 73 y.o. male presenting for Edema (x 1 week. Right lower leg and foot swollen. redness present.)     HPI   #Edema - hx of cellulitis on the right leg - currently with right leg swelling and erythema - hx of requiring IV antibiotics - swelling started 1 week ago - has been elevating and moving around to see if that would help - doing PT through the [place] currently  - some pain present - Chills - but also had addison's disease - feels like the swelling is getting worse - redness has been spreading and also with splotchy rash too   Review of Systems  Constitutional: Positive for chills. Negative for fever.  Skin: Positive for color change.     Social History   Tobacco Use  Smoking Status Never Smoker  Smokeless Tobacco Never Used        Objective:    BP Readings from Last 3 Encounters:  12/31/18 128/76  10/03/18 112/72  09/07/18 130/80   Wt Readings from Last 3 Encounters:  12/31/18 (!) 371 lb 12 oz (168.6 kg)  10/03/18 (!) 348 lb 6.4 oz (158 kg)  09/07/18 (!) 342 lb 11.2 oz (155.4 kg)    BP 128/76   Pulse 82   Temp 98.9 F (37.2 C)   Resp (!) 22   Ht 6' 4.5" (1.943 m)   Wt (!) 371 lb 12 oz (168.6 kg)   SpO2 98%   BMI 44.66 kg/m    Physical Exam Constitutional:      Appearance: Normal appearance. He is obese. He is not ill-appearing or diaphoretic.  HENT:     Right Ear: External ear normal.     Left Ear: External ear normal.     Nose: Nose normal.  Eyes:     General: No scleral icterus.    Extraocular Movements: Extraocular movements intact.     Conjunctiva/sclera: Conjunctivae normal.  Neck:     Musculoskeletal: Neck supple.  Cardiovascular:     Rate and Rhythm: Normal rate.  Pulmonary:     Effort: Pulmonary effort is normal.  Musculoskeletal:     Right lower leg: Edema (2+ to knee) present.     Left lower leg: Edema (trace) present.     Comments: Slow movement due to back pain.   Skin:  General: Skin is warm and dry.     Comments: Right LE with erythema over the calf. No TTP. Mildly warm compared to the left LE. Also with some scattered erythematous scabbed lesions above the area of erythema.  Neurological:     Mental Status: He is alert. Mental status is at baseline.  Psychiatric:        Mood and Affect: Mood normal.           Assessment & Plan:   Problem List Items Addressed This Visit      Other   Cellulitis of right leg - Primary   Relevant Medications   cephALEXin (KEFLEX) 500 MG capsule     Given pt history suspect cellulitis and will treat with Keflex. Considered blood clot but has been active recently. Also considered venous stasis changes, but pt notes that erythema persists with elevation which would be inconsistent.    Return if symptoms worsen or fail to improve.  [name], MD

## 2019-01-02 ENCOUNTER — Other Ambulatory Visit: Payer: Self-pay | Admitting: Family Medicine

## 2019-01-02 ENCOUNTER — Encounter: Payer: Self-pay | Admitting: Family Medicine

## 2019-01-02 DIAGNOSIS — I480 Paroxysmal atrial fibrillation: Secondary | ICD-10-CM

## 2019-01-02 MED ORDER — APIXABAN 5 MG PO TABS: 5.0000 mg | ORAL_TABLET | Freq: Two times a day (BID) | ORAL | 3 refills | Status: DC

## 2019-01-02 MED ORDER — DULOXETINE HCL 60 MG PO CPEP: 60.0000 mg | ORAL_CAPSULE | Freq: Every day | ORAL | 3 refills | Status: DC

## 2019-01-02 MED ORDER — TOPIRAMATE 50 MG PO TABS: 50.0000 mg | ORAL_TABLET | Freq: Two times a day (BID) | ORAL | 3 refills | Status: DC

## 2019-01-02 NOTE — Progress Notes (Signed)
Carelink Summary Report / Loop Recorder 

## 2019-01-03 DIAGNOSIS — Z6841 Body Mass Index (BMI) 40.0 and over, adult: Secondary | ICD-10-CM | POA: Diagnosis not present

## 2019-01-03 DIAGNOSIS — I10 Essential (primary) hypertension: Secondary | ICD-10-CM | POA: Diagnosis not present

## 2019-01-03 DIAGNOSIS — M549 Dorsalgia, unspecified: Secondary | ICD-10-CM | POA: Diagnosis not present

## 2019-01-03 DIAGNOSIS — M545 Low back pain: Secondary | ICD-10-CM | POA: Diagnosis not present

## 2019-01-11 ENCOUNTER — Encounter: Payer: Self-pay | Admitting: Family Medicine

## 2019-01-13 ENCOUNTER — Encounter: Payer: Self-pay | Admitting: Family Medicine

## 2019-01-22 ENCOUNTER — Encounter: Payer: Self-pay | Admitting: Gastroenterology

## 2019-01-22 ENCOUNTER — Other Ambulatory Visit: Payer: Self-pay | Admitting: Family Medicine

## 2019-01-22 DIAGNOSIS — M549 Dorsalgia, unspecified: Secondary | ICD-10-CM | POA: Diagnosis not present

## 2019-01-22 DIAGNOSIS — L989 Disorder of the skin and subcutaneous tissue, unspecified: Secondary | ICD-10-CM

## 2019-01-22 NOTE — Progress Notes (Signed)
The pt has been advised and states that the lab may have been some other testing.  He will wait for further testing after next colonoscopy.

## 2019-01-22 NOTE — Progress Notes (Signed)
Dermatology referral order placed per patient request.

## 2019-01-22 NOTE — Progress Notes (Signed)
We have received medical record a follow-up. The patient did not have an anti-enterocyte antibody performed per any of their records. We will have this scanned into the chart.  Patty, can you reach out to the patient and let him know that we cannot find this. I am not sure if he ever ended up getting it completed although he stated that he had. It is something that we may consider retrying to do after we follow-up his next colonoscopy. If he remains sure that this was completed then we will need him to reach out to his insurance to see if he ever ended up having this laboratory completed otherwise we may consider reviewing it.   Justice Britain, MD [facility] Advanced [facility]oscopy Office # [phone]

## 2019-01-23 ENCOUNTER — Encounter: Payer: Self-pay | Admitting: Family Medicine

## 2019-01-25 ENCOUNTER — Telehealth: Payer: Self-pay | Admitting: Family Medicine

## 2019-01-25 NOTE — Telephone Encounter (Signed)
Sleep study dropped off On cart to be delivered

## 2019-01-29 DIAGNOSIS — L821 Other seborrheic keratosis: Secondary | ICD-10-CM | POA: Diagnosis not present

## 2019-01-29 DIAGNOSIS — L308 Other specified dermatitis: Secondary | ICD-10-CM | POA: Diagnosis not present

## 2019-01-29 DIAGNOSIS — L820 Inflamed seborrheic keratosis: Secondary | ICD-10-CM | POA: Diagnosis not present

## 2019-01-30 ENCOUNTER — Other Ambulatory Visit: Payer: Self-pay | Admitting: Family Medicine

## 2019-01-30 ENCOUNTER — Ambulatory Visit (INDEPENDENT_AMBULATORY_CARE_PROVIDER_SITE_OTHER): Payer: Medicare Other | Admitting: *Deleted

## 2019-01-30 DIAGNOSIS — I480 Paroxysmal atrial fibrillation: Secondary | ICD-10-CM | POA: Diagnosis not present

## 2019-01-30 DIAGNOSIS — G4733 Obstructive sleep apnea (adult) (pediatric): Secondary | ICD-10-CM

## 2019-01-30 DIAGNOSIS — I63312 Cerebral infarction due to thrombosis of left middle cerebral artery: Secondary | ICD-10-CM

## 2019-01-30 NOTE — Telephone Encounter (Signed)
Faxed to equipment company. Original to be mailed back to patient.

## 2019-01-30 NOTE — Progress Notes (Signed)
Order for cpap to [facility] with copy of sleep study provided by patient.

## 2019-01-31 LAB — CUP PACEART REMOTE DEVICE CHECK
Date Time Interrogation Session: 20200722223727
Implantable Pulse Generator Implant Date: 20170719

## 2019-02-01 ENCOUNTER — Encounter: Payer: Self-pay | Admitting: Family Medicine

## 2019-02-01 DIAGNOSIS — M150 Primary generalized (osteo)arthritis: Secondary | ICD-10-CM | POA: Diagnosis not present

## 2019-02-01 DIAGNOSIS — M1A09X1 Idiopathic chronic gout, multiple sites, with tophus (tophi): Secondary | ICD-10-CM | POA: Diagnosis not present

## 2019-02-04 ENCOUNTER — Ambulatory Visit: Payer: Medicare Other | Admitting: Family Medicine

## 2019-02-04 ENCOUNTER — Encounter: Payer: Self-pay | Admitting: Family Medicine

## 2019-02-04 ENCOUNTER — Other Ambulatory Visit: Payer: Self-pay

## 2019-02-04 ENCOUNTER — Other Ambulatory Visit: Payer: Self-pay | Admitting: Family Medicine

## 2019-02-04 ENCOUNTER — Ambulatory Visit
Admission: RE | Admit: 2019-02-04 | Discharge: 2019-02-04 | Disposition: A | Discharge status: Home / Self Care | Payer: Medicare Other | Source: Ambulatory Visit | Attending: Family Medicine | Admitting: Family Medicine

## 2019-02-04 ENCOUNTER — Ambulatory Visit (INDEPENDENT_AMBULATORY_CARE_PROVIDER_SITE_OTHER): Payer: Medicare Other | Admitting: Family Medicine

## 2019-02-04 VITALS — BP 166/84 | HR 90 | Temp 97.2°F | Ht 76.5 in

## 2019-02-04 DIAGNOSIS — G20 Parkinson's disease: Secondary | ICD-10-CM

## 2019-02-04 DIAGNOSIS — L03115 Cellulitis of right lower limb: Secondary | ICD-10-CM

## 2019-02-04 DIAGNOSIS — M545 Low back pain, unspecified: Secondary | ICD-10-CM

## 2019-02-04 DIAGNOSIS — L03116 Cellulitis of left lower limb: Secondary | ICD-10-CM

## 2019-02-04 DIAGNOSIS — Z13820 Encounter for screening for osteoporosis: Secondary | ICD-10-CM | POA: Insufficient documentation

## 2019-02-04 DIAGNOSIS — M5441 Lumbago with sciatica, right side: Secondary | ICD-10-CM

## 2019-02-04 DIAGNOSIS — M109 Gout, unspecified: Secondary | ICD-10-CM | POA: Diagnosis not present

## 2019-02-04 DIAGNOSIS — I639 Cerebral infarction, unspecified: Secondary | ICD-10-CM

## 2019-02-04 DIAGNOSIS — G20C Parkinsonism, unspecified: Secondary | ICD-10-CM

## 2019-02-04 DIAGNOSIS — E039 Hypothyroidism, unspecified: Secondary | ICD-10-CM

## 2019-02-04 DIAGNOSIS — G8929 Other chronic pain: Secondary | ICD-10-CM

## 2019-02-04 DIAGNOSIS — R29890 Loss of height: Secondary | ICD-10-CM | POA: Diagnosis not present

## 2019-02-04 DIAGNOSIS — R8290 Unspecified abnormal findings in urine: Secondary | ICD-10-CM | POA: Diagnosis not present

## 2019-02-04 DIAGNOSIS — Z7952 Long term (current) use of systemic steroids: Secondary | ICD-10-CM | POA: Diagnosis not present

## 2019-02-04 DIAGNOSIS — E271 Primary adrenocortical insufficiency: Secondary | ICD-10-CM

## 2019-02-04 DIAGNOSIS — M5442 Lumbago with sciatica, left side: Secondary | ICD-10-CM

## 2019-02-04 LAB — POC URINALSYSI DIPSTICK (AUTOMATED)
Bilirubin, UA: 1
Blood, UA: NEGATIVE
Glucose, UA: NEGATIVE
Ketones, UA: 5
Leukocytes, UA: NEGATIVE
Nitrite, UA: NEGATIVE
Protein, UA: POSITIVE — AB
Spec Grav, UA: >=1.03 — AB (ref 1.010–1.025)
Urobilinogen, UA: 1 E.U./dL
pH, UA: 6 (ref 5.0–8.0)

## 2019-02-04 LAB — CBC WITH DIFFERENTIAL/PLATELET
Basophils Absolute: 0 10*3/uL (ref 0.0–0.1)
Basophils Relative: 0.9 % (ref 0.0–3.0)
Eosinophils Absolute: 0.2 10*3/uL (ref 0.0–0.7)
Eosinophils Relative: 4.3 % (ref 0.0–5.0)
HCT: 47.4 % (ref 39.0–52.0)
Hemoglobin: 15.7 g/dL (ref 13.0–17.0)
Lymphocytes Relative: 27.1 % (ref 12.0–46.0)
Lymphs Abs: 1.5 10*3/uL (ref 0.7–4.0)
MCHC: 33.1 g/dL (ref 30.0–36.0)
MCV: 86 fl (ref 78.0–100.0)
Monocytes Absolute: 0.4 10*3/uL (ref 0.1–1.0)
Monocytes Relative: 7 % (ref 3.0–12.0)
Neutro Abs: 3.3 10*3/uL (ref 1.4–7.7)
Neutrophils Relative %: 60.7 % (ref 43.0–77.0)
Platelets: 206 10*3/uL (ref 150.0–400.0)
RBC: 5.52 Mil/uL (ref 4.22–5.81)
RDW: 14.6 % (ref 11.5–15.5)
WBC: 5.5 10*3/uL (ref 4.0–10.5)

## 2019-02-04 LAB — COMPREHENSIVE METABOLIC PANEL
ALT: 14 U/L (ref 0–53)
AST: 15 U/L (ref 0–37)
Albumin: 4.3 g/dL (ref 3.5–5.2)
Alkaline Phosphatase: 60 U/L (ref 39–117)
BUN: 20 mg/dL (ref 6–23)
CO2: 27 mEq/L (ref 19–32)
Calcium: 10.2 mg/dL (ref 8.4–10.5)
Chloride: 103 mEq/L (ref 96–112)
Creatinine, Ser: 1.27 mg/dL (ref 0.40–1.50)
GFR: 55.54 mL/min — ABNORMAL LOW (ref >60.00)
Glucose, Bld: 111 mg/dL — ABNORMAL HIGH (ref 70–99)
Potassium: 3.9 mEq/L (ref 3.5–5.1)
Sodium: 139 mEq/L (ref 135–145)
Total Bilirubin: 1.2 mg/dL (ref 0.2–1.2)
Total Protein: 7.4 g/dL (ref 6.0–8.3)

## 2019-02-04 LAB — URIC ACID: Uric Acid, Serum: 6.9 mg/dL (ref 4.0–7.8)

## 2019-02-04 MED ORDER — SULFAMETHOXAZOLE-TRIMETHOPRIM 800-160 MG PO TABS: 1.0000 | ORAL_TABLET | Freq: Two times a day (BID) | ORAL | 0 refills | Status: DC

## 2019-02-04 MED ORDER — HYDROCORTISONE 10 MG PO TABS: ORAL_TABLET | ORAL | 0 refills | Status: DC

## 2019-02-04 MED ORDER — LEVOTHYROXINE SODIUM 137 MCG PO TABS: ORAL_TABLET | ORAL | 0 refills | Status: DC

## 2019-02-04 MED ORDER — CARBIDOPA-LEVODOPA 25-250 MG PO TABS: 2.0000 | ORAL_TABLET | Freq: Four times a day (QID) | ORAL | 0 refills | Status: DC

## 2019-02-04 MED ORDER — TOPIRAMATE 50 MG PO TABS: 50.0000 mg | ORAL_TABLET | Freq: Two times a day (BID) | ORAL | 0 refills | Status: DC
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
## 2019-02-04 NOTE — Assessment & Plan Note (Signed)
Pt desired cmet and uric acid for his rheumatologist Dr Amil Amen today

## 2019-02-04 NOTE — Patient Instructions (Addendum)
I sent in your px  Lets check your urine and some labs   Take bactrim DS for cellulitis-if not improving let us know and get to the ER  Watch for increased pain /redness/fever please go   Keep feet elevated if you can

## 2019-02-04 NOTE — Assessment & Plan Note (Signed)
Today-it appears to be both feet  In setting of pedal edema in obese male with complex medical hx and venous insuff Past h/o mrsa per pt and hosp for this  He wants to avoid hospitalization (also disc with family) and will begin tx with bactrim  Enc elevation of legs If no imp in 1-2 d or if worse will call and go to ED

## 2019-02-04 NOTE — Assessment & Plan Note (Signed)
Back pain is much worse today (with movement and change of position) He plans to check in with his neurosurgeon  complic hx of fusion with revision   UA shows some protein and concentration Enc fluids  cx sent as well

## 2019-02-04 NOTE — Assessment & Plan Note (Signed)
Refilled cortef once in absence of pcp

## 2019-02-04 NOTE — Assessment & Plan Note (Signed)
Refilled levothyroxine times one while pcp is out

## 2019-02-04 NOTE — Assessment & Plan Note (Signed)
Refilled medication in absence of pcp

## 2019-02-04 NOTE — Assessment & Plan Note (Signed)
With poor mobility  This with dependent legs may have worsened edema

## 2019-02-06 ENCOUNTER — Telehealth: Payer: Self-pay | Admitting: Cardiovascular Disease

## 2019-02-06 LAB — URINE CULTURE
MICRO NUMBER:: 707107
SPECIMEN QUALITY:: ADEQUATE

## 2019-02-06 NOTE — Telephone Encounter (Signed)
New message:    Patient calling stating that he need some one to call him concering his Loop Recorded. Please call patient.

## 2019-02-06 NOTE — Telephone Encounter (Signed)
Pt had questions regarding his loop recorder battery. Battery status ok. Pt states he wants to continue monitoring. Informed pt that someone would give him a call when the battery reaches RRT. No further questions at this time.

## 2019-02-12 ENCOUNTER — Encounter: Payer: Self-pay | Admitting: Family Medicine

## 2019-02-13 NOTE — Progress Notes (Signed)
Carelink Summary Report / Loop Recorder 

## 2019-02-19 DIAGNOSIS — M5416 Radiculopathy, lumbar region: Secondary | ICD-10-CM | POA: Diagnosis not present

## 2019-02-20 ENCOUNTER — Other Ambulatory Visit (HOSPITAL_COMMUNITY): Payer: Self-pay | Admitting: Student

## 2019-02-20 ENCOUNTER — Telehealth: Payer: Self-pay | Admitting: Cardiovascular Disease

## 2019-02-20 ENCOUNTER — Other Ambulatory Visit: Payer: Self-pay | Admitting: Student

## 2019-02-20 DIAGNOSIS — M5416 Radiculopathy, lumbar region: Secondary | ICD-10-CM

## 2019-02-20 NOTE — Telephone Encounter (Signed)
New message:    Patient calling he needs to have a MRI and he have a loop recorder that he has in his chest and 2 hospital has turned him down. Please call patient.

## 2019-02-20 NOTE — Telephone Encounter (Signed)
Spoke with patient. He reports that he was told he could not have an MRI at [facility] or [facility] as the device could "blow up" in his chest. Explained to patient that his device is FDA-approved for certain types of MRIs and that we regularly scan patients at [facility] facilities. Pt reports he does have an MRI scheduled within our system on 03/04/19. He reports it is very inconvenient that he cannot have an MRI done everywhere and asked when his device will reach RRT. Explained it has been in for >3 years (implanted 01/27/2016), but that it is not yet at RRT. Discussed options at this point, pt wishes to discontinue monitoring and wants to discuss LINQ removal with [name]. Scheduled for OV on 03/27/19 at 11:00am. Unenrolled from [place], return kit ordered. Pt aware to unplug monitor. No further questions at this time.

## 2019-02-21 ENCOUNTER — Emergency Department (HOSPITAL_COMMUNITY): Payer: Medicare Other

## 2019-02-21 ENCOUNTER — Telehealth: Payer: Self-pay | Admitting: Cardiovascular Disease

## 2019-02-21 ENCOUNTER — Inpatient Hospital Stay (HOSPITAL_COMMUNITY)
Admission: EM | Admit: 2019-02-21 | Discharge: 2019-02-24 | DRG: 292 | Disposition: A | Discharge status: Home / Self Care | Payer: Medicare Other | Attending: Internal Medicine | Admitting: Internal Medicine

## 2019-02-21 ENCOUNTER — Other Ambulatory Visit: Payer: Self-pay

## 2019-02-21 DIAGNOSIS — R0602 Shortness of breath: Secondary | ICD-10-CM | POA: Diagnosis not present

## 2019-02-21 DIAGNOSIS — F329 Major depressive disorder, single episode, unspecified: Secondary | ICD-10-CM | POA: Diagnosis present

## 2019-02-21 DIAGNOSIS — R000 Tachycardia, unspecified: Secondary | ICD-10-CM | POA: Diagnosis not present

## 2019-02-21 DIAGNOSIS — I5033 Acute on chronic diastolic (congestive) heart failure: Secondary | ICD-10-CM | POA: Diagnosis present

## 2019-02-21 DIAGNOSIS — E8779 Other fluid overload: Secondary | ICD-10-CM | POA: Diagnosis not present

## 2019-02-21 DIAGNOSIS — G8929 Other chronic pain: Secondary | ICD-10-CM

## 2019-02-21 DIAGNOSIS — E8770 Fluid overload, unspecified: Secondary | ICD-10-CM

## 2019-02-21 DIAGNOSIS — I110 Hypertensive heart disease with heart failure: Principal | ICD-10-CM | POA: Diagnosis present

## 2019-02-21 DIAGNOSIS — M5442 Lumbago with sciatica, left side: Secondary | ICD-10-CM

## 2019-02-21 DIAGNOSIS — Z8547 Personal history of malignant neoplasm of testis: Secondary | ICD-10-CM

## 2019-02-21 DIAGNOSIS — Z6841 Body Mass Index (BMI) 40.0 and over, adult: Secondary | ICD-10-CM | POA: Diagnosis not present

## 2019-02-21 DIAGNOSIS — Z20828 Contact with and (suspected) exposure to other viral communicable diseases: Secondary | ICD-10-CM | POA: Diagnosis present

## 2019-02-21 DIAGNOSIS — I509 Heart failure, unspecified: Secondary | ICD-10-CM | POA: Insufficient documentation

## 2019-02-21 DIAGNOSIS — E271 Primary adrenocortical insufficiency: Secondary | ICD-10-CM | POA: Diagnosis present

## 2019-02-21 DIAGNOSIS — M5127 Other intervertebral disc displacement, lumbosacral region: Secondary | ICD-10-CM | POA: Diagnosis present

## 2019-02-21 DIAGNOSIS — M549 Dorsalgia, unspecified: Secondary | ICD-10-CM | POA: Diagnosis present

## 2019-02-21 DIAGNOSIS — M109 Gout, unspecified: Secondary | ICD-10-CM | POA: Diagnosis present

## 2019-02-21 DIAGNOSIS — Z79891 Long term (current) use of opiate analgesic: Secondary | ICD-10-CM

## 2019-02-21 DIAGNOSIS — G20 Parkinson's disease: Secondary | ICD-10-CM | POA: Diagnosis present

## 2019-02-21 DIAGNOSIS — E785 Hyperlipidemia, unspecified: Secondary | ICD-10-CM | POA: Diagnosis present

## 2019-02-21 DIAGNOSIS — Z981 Arthrodesis status: Secondary | ICD-10-CM

## 2019-02-21 DIAGNOSIS — E039 Hypothyroidism, unspecified: Secondary | ICD-10-CM | POA: Diagnosis not present

## 2019-02-21 DIAGNOSIS — Z823 Family history of stroke: Secondary | ICD-10-CM

## 2019-02-21 DIAGNOSIS — I252 Old myocardial infarction: Secondary | ICD-10-CM

## 2019-02-21 DIAGNOSIS — Z7901 Long term (current) use of anticoagulants: Secondary | ICD-10-CM

## 2019-02-21 DIAGNOSIS — I2510 Atherosclerotic heart disease of native coronary artery without angina pectoris: Secondary | ICD-10-CM | POA: Diagnosis not present

## 2019-02-21 DIAGNOSIS — I5032 Chronic diastolic (congestive) heart failure: Secondary | ICD-10-CM | POA: Diagnosis present

## 2019-02-21 DIAGNOSIS — I480 Paroxysmal atrial fibrillation: Secondary | ICD-10-CM | POA: Diagnosis present

## 2019-02-21 DIAGNOSIS — Z833 Family history of diabetes mellitus: Secondary | ICD-10-CM

## 2019-02-21 DIAGNOSIS — Z885 Allergy status to narcotic agent status: Secondary | ICD-10-CM

## 2019-02-21 DIAGNOSIS — Z8701 Personal history of pneumonia (recurrent): Secondary | ICD-10-CM

## 2019-02-21 DIAGNOSIS — I10 Essential (primary) hypertension: Secondary | ICD-10-CM | POA: Diagnosis not present

## 2019-02-21 DIAGNOSIS — Z8249 Family history of ischemic heart disease and other diseases of the circulatory system: Secondary | ICD-10-CM

## 2019-02-21 DIAGNOSIS — E2740 Unspecified adrenocortical insufficiency: Secondary | ICD-10-CM | POA: Diagnosis present

## 2019-02-21 DIAGNOSIS — Z79890 Hormone replacement therapy (postmenopausal): Secondary | ICD-10-CM

## 2019-02-21 DIAGNOSIS — Z8673 Personal history of transient ischemic attack (TIA), and cerebral infarction without residual deficits: Secondary | ICD-10-CM

## 2019-02-21 DIAGNOSIS — Z79899 Other long term (current) drug therapy: Secondary | ICD-10-CM

## 2019-02-21 LAB — CBC
HCT: 49.9 % (ref 39.0–52.0)
Hemoglobin: 16.3 g/dL (ref 13.0–17.0)
MCH: 28.5 pg (ref 26.0–34.0)
MCHC: 32.7 g/dL (ref 30.0–36.0)
MCV: 87.4 fL (ref 80.0–100.0)
Platelets: 213 10*3/uL (ref 150–400)
RBC: 5.71 MIL/uL (ref 4.22–5.81)
RDW: 13.5 % (ref 11.5–15.5)
WBC: 9.9 10*3/uL (ref 4.0–10.5)
nRBC: 0 % (ref 0.0–0.2)

## 2019-02-21 LAB — BASIC METABOLIC PANEL
Anion gap: 10 (ref 5–15)
BUN: 14 mg/dL (ref 8–23)
CO2: 24 mmol/L (ref 22–32)
Calcium: 9.9 mg/dL (ref 8.9–10.3)
Chloride: 103 mmol/L (ref 98–111)
Creatinine, Ser: 1.37 mg/dL — ABNORMAL HIGH (ref 0.61–1.24)
GFR calc Af Amer: 59 mL/min — ABNORMAL LOW (ref >60)
GFR calc non Af Amer: 51 mL/min — ABNORMAL LOW (ref >60)
Glucose, Bld: 123 mg/dL — ABNORMAL HIGH (ref 70–99)
Potassium: 4.3 mmol/L (ref 3.5–5.1)
Sodium: 137 mmol/L (ref 135–145)

## 2019-02-21 LAB — BRAIN NATRIURETIC PEPTIDE: B Natriuretic Peptide: 29.3 pg/mL (ref 0.0–100.0)

## 2019-02-21 LAB — MAGNESIUM: Magnesium: 2 mg/dL (ref 1.7–2.4)

## 2019-02-21 LAB — TSH: TSH: 2.21 u[IU]/mL (ref 0.350–4.500)

## 2019-02-21 LAB — D-DIMER, QUANTITATIVE: D-Dimer, Quant: 1.15 ug/mL-FEU — ABNORMAL HIGH (ref 0.00–0.50)

## 2019-02-21 MED ORDER — TOPIRAMATE 25 MG PO TABS
50.0000 mg | ORAL_TABLET | Freq: Two times a day (BID) | ORAL | Status: DC
  Administered 2019-02-21 – 2019-02-24 (×6): 50 mg via ORAL
  Filled 2019-02-21 (×6): qty 2

## 2019-02-21 MED ORDER — CARBIDOPA-LEVODOPA 25-250 MG PO TABS
2.0000 | ORAL_TABLET | Freq: Four times a day (QID) | ORAL | Status: DC
  Administered 2019-02-21 – 2019-02-24 (×11): 2 via ORAL
  Filled 2019-02-21 (×13): qty 2

## 2019-02-21 MED ORDER — TESTOSTERONE CYPIONATE 200 MG/ML IM SOLN
150.0000 mg | INTRAMUSCULAR | Status: DC
  Filled 2019-02-21: qty 0.75

## 2019-02-21 MED ORDER — HYDROCORTISONE 10 MG PO TABS: 10.0000 mg | ORAL_TABLET | ORAL | Status: DC

## 2019-02-21 MED ORDER — MIDODRINE HCL 5 MG PO TABS: 5.0000 mg | ORAL_TABLET | Freq: Every day | ORAL | Status: DC | PRN

## 2019-02-21 MED ORDER — TRAMADOL HCL 50 MG PO TABS: 50.0000 mg | ORAL_TABLET | Freq: Four times a day (QID) | ORAL | Status: DC | PRN

## 2019-02-21 MED ORDER — HYDROCORTISONE 10 MG PO TABS
10.0000 mg | ORAL_TABLET | Freq: Every day | ORAL | Status: DC
  Administered 2019-02-21 – 2019-02-23 (×3): 10 mg via ORAL
  Filled 2019-02-21 (×4): qty 1

## 2019-02-21 MED ORDER — SODIUM CHLORIDE 0.9% FLUSH: 3.0000 mL | INTRAVENOUS | Status: DC | PRN

## 2019-02-21 MED ORDER — SODIUM CHLORIDE 0.9% FLUSH
3.0000 mL | Freq: Two times a day (BID) | INTRAVENOUS | Status: DC
  Administered 2019-02-21 – 2019-02-24 (×6): 3 mL via INTRAVENOUS

## 2019-02-21 MED ORDER — SODIUM CHLORIDE 0.9% FLUSH
3.0000 mL | Freq: Once | INTRAVENOUS | Status: AC
  Administered 2019-02-21: 3 mL via INTRAVENOUS

## 2019-02-21 MED ORDER — ATORVASTATIN CALCIUM 10 MG PO TABS
20.0000 mg | ORAL_TABLET | Freq: Every day | ORAL | Status: DC
  Administered 2019-02-21 – 2019-02-24 (×4): 20 mg via ORAL
  Filled 2019-02-21 (×4): qty 2

## 2019-02-21 MED ORDER — SODIUM CHLORIDE 0.9 % IV SOLN: 250.0000 mL | INTRAVENOUS | Status: DC | PRN

## 2019-02-21 MED ORDER — HYDROCORTISONE 20 MG PO TABS
20.0000 mg | ORAL_TABLET | Freq: Every day | ORAL | Status: DC
  Administered 2019-02-22 – 2019-02-24 (×3): 20 mg via ORAL
  Filled 2019-02-21 (×3): qty 1

## 2019-02-21 MED ORDER — APIXABAN 5 MG PO TABS
5.0000 mg | ORAL_TABLET | Freq: Two times a day (BID) | ORAL | Status: DC
  Administered 2019-02-21 – 2019-02-24 (×6): 5 mg via ORAL
  Filled 2019-02-21 (×6): qty 1

## 2019-02-21 MED ORDER — ACETAMINOPHEN 325 MG PO TABS: 650.0000 mg | ORAL_TABLET | ORAL | Status: DC | PRN

## 2019-02-21 MED ORDER — COLCHICINE 0.6 MG PO TABS
0.6000 mg | ORAL_TABLET | Freq: Two times a day (BID) | ORAL | Status: DC
  Administered 2019-02-21 – 2019-02-24 (×6): 0.6 mg via ORAL
  Filled 2019-02-21 (×6): qty 1

## 2019-02-21 MED ORDER — LEVOTHYROXINE SODIUM 137 MCG PO TABS
274.0000 ug | ORAL_TABLET | Freq: Every day | ORAL | Status: DC
  Filled 2019-02-21: qty 2

## 2019-02-21 MED ORDER — ONDANSETRON HCL 4 MG/2ML IJ SOLN: 4.0000 mg | Freq: Four times a day (QID) | INTRAMUSCULAR | Status: DC | PRN

## 2019-02-21 MED ORDER — FUROSEMIDE 10 MG/ML IJ SOLN
40.0000 mg | Freq: Two times a day (BID) | INTRAMUSCULAR | Status: DC
  Administered 2019-02-21 – 2019-02-24 (×6): 40 mg via INTRAVENOUS
  Filled 2019-02-21 (×6): qty 4

## 2019-02-21 MED ORDER — FUROSEMIDE 10 MG/ML IJ SOLN
40.0000 mg | Freq: Once | INTRAMUSCULAR | Status: AC
  Administered 2019-02-21: 40 mg via INTRAVENOUS
  Filled 2019-02-21: qty 4

## 2019-02-21 MED ORDER — DULOXETINE HCL 60 MG PO CPEP
60.0000 mg | ORAL_CAPSULE | Freq: Every day | ORAL | Status: DC
  Administered 2019-02-21 – 2019-02-24 (×4): 60 mg via ORAL
  Filled 2019-02-21 (×4): qty 1

## 2019-02-21 MED ORDER — ENOXAPARIN SODIUM 40 MG/0.4ML ~~LOC~~ SOLN: 40.0000 mg | SUBCUTANEOUS | Status: DC

## 2019-02-21 NOTE — Telephone Encounter (Signed)
Pts wife called to report that the pt has been feeling very bad over the past several days and it is worsening.. he has been on steroids for a few months and his weight has been rapidly increasing.. his weight his up 45 lbs over the past 3-4 weeks.. his extremities are very swollen along with his abdomen (he cannot wear his normal pants) and he is having terrible back pain.. he is very sob and very uncomfortable. She reports that his feet are quite painful and they are turning purple and cold to touch.  After talking with his wife and he was also on the phone... they agree to have EMS take him to the ER.

## 2019-02-21 NOTE — ED Triage Notes (Signed)
Pt reports worsening SOB for the last 5 weeks. Pt reports a weight gain of 60lbs. Pt states that he was sent by cardiology. Pt reports swelling in legs and abdomen.

## 2019-02-21 NOTE — Telephone Encounter (Signed)
New Message     Pt c/o swelling: STAT is pt has developed SOB within 24 hours  1) How much weight have you gained and in what time span? 45lbs in less than 4 weeks  2) If swelling, where is the swelling located? Stomoch, Thighs, Legs, Feet, and ankles.  Legs are cold and purple.  3) Are you currently taking a fluid pill? No  4) Are you currently SOB? Yes  5) Do you have a log of your daily weights (if so, list)? Tuesday 385lbs is the only weight recorded they have  6) Have you gained 3 pounds in a day or 5 pounds in a week? Yes   7) Have you traveled recently? NO

## 2019-02-21 NOTE — Plan of Care (Signed)
  Problem: Education: Goal: Knowledge of General Education information will improve Description Including pain rating scale, medication(s)/side effects and non-pharmacologic comfort measures Outcome: Progressing   

## 2019-02-21 NOTE — Progress Notes (Signed)
Late entry- pt received at change of shift to 3e24, pt alert and oriented, vss, CCMD called and confirmed sr on box 20, pt says he has chronic pain and he is fine, wife at bedside and set up home cpap, report given to oncoming nurse Ray.
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
## 2019-02-21 NOTE — ED Notes (Signed)
Attempted report to floor, no bed assigned to patient at this time.
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
## 2019-02-22 ENCOUNTER — Observation Stay (HOSPITAL_COMMUNITY): Payer: Medicare Other

## 2019-02-22 ENCOUNTER — Encounter (HOSPITAL_COMMUNITY): Payer: Self-pay

## 2019-02-22 ENCOUNTER — Other Ambulatory Visit: Payer: Self-pay

## 2019-02-22 ENCOUNTER — Observation Stay (HOSPITAL_BASED_OUTPATIENT_CLINIC_OR_DEPARTMENT_OTHER): Payer: Medicare Other

## 2019-02-22 DIAGNOSIS — G8929 Other chronic pain: Secondary | ICD-10-CM | POA: Diagnosis present

## 2019-02-22 DIAGNOSIS — G20 Parkinson's disease: Secondary | ICD-10-CM | POA: Diagnosis present

## 2019-02-22 DIAGNOSIS — I252 Old myocardial infarction: Secondary | ICD-10-CM | POA: Diagnosis not present

## 2019-02-22 DIAGNOSIS — Z20828 Contact with and (suspected) exposure to other viral communicable diseases: Secondary | ICD-10-CM | POA: Diagnosis present

## 2019-02-22 DIAGNOSIS — Z8673 Personal history of transient ischemic attack (TIA), and cerebral infarction without residual deficits: Secondary | ICD-10-CM | POA: Diagnosis not present

## 2019-02-22 DIAGNOSIS — I110 Hypertensive heart disease with heart failure: Secondary | ICD-10-CM | POA: Diagnosis present

## 2019-02-22 DIAGNOSIS — E2740 Unspecified adrenocortical insufficiency: Secondary | ICD-10-CM | POA: Diagnosis not present

## 2019-02-22 DIAGNOSIS — I5033 Acute on chronic diastolic (congestive) heart failure: Secondary | ICD-10-CM | POA: Diagnosis present

## 2019-02-22 DIAGNOSIS — E039 Hypothyroidism, unspecified: Secondary | ICD-10-CM | POA: Diagnosis not present

## 2019-02-22 DIAGNOSIS — Z823 Family history of stroke: Secondary | ICD-10-CM | POA: Diagnosis not present

## 2019-02-22 DIAGNOSIS — Z6841 Body Mass Index (BMI) 40.0 and over, adult: Secondary | ICD-10-CM | POA: Diagnosis not present

## 2019-02-22 DIAGNOSIS — I480 Paroxysmal atrial fibrillation: Secondary | ICD-10-CM | POA: Diagnosis present

## 2019-02-22 DIAGNOSIS — I5031 Acute diastolic (congestive) heart failure: Secondary | ICD-10-CM

## 2019-02-22 DIAGNOSIS — F329 Major depressive disorder, single episode, unspecified: Secondary | ICD-10-CM | POA: Diagnosis present

## 2019-02-22 DIAGNOSIS — Z885 Allergy status to narcotic agent status: Secondary | ICD-10-CM | POA: Diagnosis not present

## 2019-02-22 DIAGNOSIS — E271 Primary adrenocortical insufficiency: Secondary | ICD-10-CM | POA: Diagnosis present

## 2019-02-22 DIAGNOSIS — Z7901 Long term (current) use of anticoagulants: Secondary | ICD-10-CM | POA: Diagnosis not present

## 2019-02-22 DIAGNOSIS — Z8547 Personal history of malignant neoplasm of testis: Secondary | ICD-10-CM | POA: Diagnosis not present

## 2019-02-22 DIAGNOSIS — M5127 Other intervertebral disc displacement, lumbosacral region: Secondary | ICD-10-CM | POA: Diagnosis not present

## 2019-02-22 DIAGNOSIS — E785 Hyperlipidemia, unspecified: Secondary | ICD-10-CM | POA: Diagnosis present

## 2019-02-22 DIAGNOSIS — I2510 Atherosclerotic heart disease of native coronary artery without angina pectoris: Secondary | ICD-10-CM | POA: Diagnosis present

## 2019-02-22 DIAGNOSIS — M109 Gout, unspecified: Secondary | ICD-10-CM | POA: Diagnosis present

## 2019-02-22 DIAGNOSIS — R0602 Shortness of breath: Secondary | ICD-10-CM | POA: Diagnosis not present

## 2019-02-22 DIAGNOSIS — Z8249 Family history of ischemic heart disease and other diseases of the circulatory system: Secondary | ICD-10-CM | POA: Diagnosis not present

## 2019-02-22 DIAGNOSIS — Z833 Family history of diabetes mellitus: Secondary | ICD-10-CM | POA: Diagnosis not present

## 2019-02-22 DIAGNOSIS — Z981 Arthrodesis status: Secondary | ICD-10-CM | POA: Diagnosis not present

## 2019-02-22 LAB — ECHOCARDIOGRAM COMPLETE
Height: 77 in
Weight: 5796.8 oz

## 2019-02-22 LAB — BASIC METABOLIC PANEL
Anion gap: 9 (ref 5–15)
BUN: 21 mg/dL (ref 8–23)
CO2: 27 mmol/L (ref 22–32)
Calcium: 9.2 mg/dL (ref 8.9–10.3)
Chloride: 102 mmol/L (ref 98–111)
Creatinine, Ser: 1.38 mg/dL — ABNORMAL HIGH (ref 0.61–1.24)
GFR calc Af Amer: 58 mL/min — ABNORMAL LOW (ref >60)
GFR calc non Af Amer: 50 mL/min — ABNORMAL LOW (ref >60)
Glucose, Bld: 115 mg/dL — ABNORMAL HIGH (ref 70–99)
Potassium: 3.5 mmol/L (ref 3.5–5.1)
Sodium: 138 mmol/L (ref 135–145)

## 2019-02-22 LAB — SARS CORONAVIRUS 2 (TAT 6-24 HRS): SARS Coronavirus 2: NEGATIVE

## 2019-02-22 MED ORDER — PERFLUTREN LIPID MICROSPHERE
1.0000 mL | INTRAVENOUS | Status: AC | PRN
  Administered 2019-02-22: 2 mL via INTRAVENOUS
  Filled 2019-02-22: qty 10

## 2019-02-22 MED ORDER — LEVOTHYROXINE SODIUM 50 MCG PO TABS
274.0000 ug | ORAL_TABLET | Freq: Every day | ORAL | Status: DC
  Administered 2019-02-22 – 2019-02-24 (×3): 274 ug via ORAL
  Filled 2019-02-22 (×3): qty 1

## 2019-02-22 MED ORDER — PNEUMOCOCCAL VAC POLYVALENT 25 MCG/0.5ML IJ INJ: 0.5000 mL | INJECTION | INTRAMUSCULAR | Status: DC

## 2019-02-22 MED ORDER — TECHNETIUM TO 99M ALBUMIN AGGREGATED
1.7000 | Freq: Once | INTRAVENOUS | Status: AC | PRN
  Administered 2019-02-22: 1.7 via INTRAVENOUS

## 2019-02-22 MED ORDER — GADOBUTROL 1 MMOL/ML IV SOLN
10.0000 mL | Freq: Once | INTRAVENOUS | Status: AC | PRN
  Administered 2019-02-22: 10 mL via INTRAVENOUS

## 2019-02-22 NOTE — Progress Notes (Signed)
Progress Note    Dennis Albornoz Sr.  DGL:[id number] DOB: 1946-02-01  DOA: 02/21/2019 PCP: [name], FNP    Brief Narrative:     Medical records reviewed and are as summarized below:  Dennis Nations Warehime Sr. is an 73 y.o. male  with medical history significant of several comorbidities including chronic diastolic congestive heart failure, paroxysmal A. fib on Eliquis, Addison's disease on hydrocortisone, chronic low back pain presented to ER with a complaint of shortness of breath.  According to patient, he has been having shortness of breath since about 5 weeks which is been getting worse to the point that he feels shortness of breath even at rest.  He could not tell me if he has any orthopnea.  He also has associated bilateral lower extremity edema for the same.   Assessment/Plan:   Active Problems:   Chronic back pain   S/P lumbar spinal fusion   CAD (coronary artery disease)   Chronic diastolic CHF (congestive heart failure) (HCC)   Acquired hypothyroidism   Morbid obesity (HCC)   Essential hypertension   Adrenal insufficiency (HCC)   Paroxysmal atrial fibrillation (HCC)   Volume overload    Acute diastolic CHF -Dyspnea/volume overload/bilateral lower extremity edema: -overnight he responded well to lasix-- will continue with close monitoring of his Cr  -  Last echo in the chart is from December 2018 which showed normal ejection fraction:  Repeat echo shows diastolic Doppler parameters consistent with  pseudo normalization  -daily weights -strict I/Os  Addison's disease:  -Resume home dose of hydrocortisone.  Blood pressure controlled.  Hypothyroidism:  -Resume Synthroid.  PAF: - In sinus rhythm.  Resume home medications and monitor on telemetry.  Chronic low back pain:  -Resume home tramadol. -this has been worsening and I wonder if it is contributing to some of his above issues -MRI was requested by [facility] Ronnald Ramp) but has been unable to be done  outpatient due to loop recorder-- will get MRI here   Parkinson's disease:  -Resume home dose of Sinemet.  Hyperlipidemia:  -Resume atorvastatin.  obesity Body mass index is 42.96 kg/m.   Family Communication/Anticipated D/C date and plan/Code Status   DVT prophylaxis: eliquis Code Status: Full Code.  Family Communication: wife Disposition Plan:    Medical Consultants:    None.     Subjective:   Thinks swelling is better but he is c/o back pain  Objective:    Vitals:   02/21/19 2358 02/22/19 0543 02/22/19 0543 02/22/19 0841  BP: 107/69  102/68 103/73  Pulse: 78  81 62  Resp: 20  20 17   Temp: 98.2 F (36.8 C)  98 F (36.7 C) 98 F (36.7 C)  TempSrc: Oral  Oral Oral  SpO2: 95%  98% 97%  Weight:  (!) 164.3 kg    Height:        Intake/Output Summary (Last 24 hours) at 02/22/2019 1320 Last data filed at 02/22/2019 0600 Gross per 24 hour  Intake 240 ml  Output 875 ml  Net -635 ml   Filed Weights   02/21/19 1618 02/21/19 1906 02/22/19 0543  Weight: (!) 176.2 kg (!) 168 kg (!) 164.3 kg    Exam: In bed, NAD rrr +LE pitting edema, rrr, chronic venous stasis changes present b/l A+Ox3   Data Reviewed:   I have personally reviewed following labs and imaging studies:  Labs: Labs show the following:   Basic Metabolic Panel: Recent Labs  Lab 02/21/19 1306 02/21/19 1930  02/22/19 0536  NA 137  --  138  K 4.3  --  3.5  CL 103  --  102  CO2 24  --  27  GLUCOSE 123*  --  115*  BUN 14  --  21  CREATININE 1.37*  --  1.38*  CALCIUM 9.9  --  9.2  MG  --  2.0  --    GFR Estimated Creatinine Clearance: 80.4 mL/min (A) (by C-G formula based on SCr of 1.38 mg/dL (H)). Liver Function Tests: No results for input(s): AST, ALT, ALKPHOS, BILITOT, PROT, ALBUMIN in the last 168 hours. No results for input(s): LIPASE, AMYLASE in the last 168 hours. No results for input(s): AMMONIA in the last 168 hours. Coagulation profile No results for input(s): INR,  PROTIME in the last 168 hours.  CBC: Recent Labs  Lab 02/21/19 1306  WBC 9.9  HGB 16.3  HCT 49.9  MCV 87.4  PLT 213   Cardiac Enzymes: No results for input(s): CKTOTAL, CKMB, CKMBINDEX, TROPONINI in the last 168 hours. BNP (last 3 results) No results for input(s): PROBNP in the last 8760 hours. CBG: No results for input(s): GLUCAP in the last 168 hours. D-Dimer: Recent Labs    02/21/19 1742  DDIMER 1.15*   Hgb A1c: No results for input(s): HGBA1C in the last 72 hours. Lipid Profile: No results for input(s): CHOL, HDL, LDLCALC, TRIG, CHOLHDL, LDLDIRECT in the last 72 hours. Thyroid function studies: Recent Labs    02/21/19 1930  TSH 2.210   Anemia work up: No results for input(s): VITAMINB12, FOLATE, FERRITIN, TIBC, IRON, RETICCTPCT in the last 72 hours. Sepsis Labs: Recent Labs  Lab 02/21/19 1306  WBC 9.9    Microbiology Recent Results (from the past 240 hour(s))  SARS CORONAVIRUS 2 Nasal Swab Aptima Multi Swab     Status: None   Collection Time: 02/21/19  4:12 PM   Specimen: Aptima Multi Swab; Nasal Swab  Result Value Ref Range Status   SARS Coronavirus 2 NEGATIVE NEGATIVE Final    Comment: (NOTE) SARS-CoV-2 target nucleic acids are NOT DETECTED. The SARS-CoV-2 RNA is generally detectable in upper and lower respiratory specimens during the acute phase of infection. Negative results do not preclude SARS-CoV-2 infection, do not rule out co-infections with other pathogens, and should not be used as the sole basis for treatment or other patient management decisions. Negative results must be combined with clinical observations, patient history, and epidemiological information. The expected result is Negative. Fact Sheet for Patients: SugarRoll.be Fact Sheet for Healthcare Providers: https://www.woods-mathews.com/ This test is not yet approved or cleared by the [place] FDA and  has been authorized for detection  and/or diagnosis of SARS-CoV-2 by FDA under an Emergency Use Authorization (EUA). This EUA will remain  in effect (meaning this test can be used) for the duration of the COVID-19 declaration under Section 56 4(b)(1) of the Act, 21 U.S.C. section 360bbb-3(b)(1), unless the authorization is terminated or revoked sooner. Performed at [facility], [place] [address] [place], [place] [address]     Procedures and diagnostic studies:  Dg Chest 2 View  Result Date: 02/21/2019 CLINICAL DATA:  Shortness of breath EXAM: CHEST - 2 VIEW COMPARISON:  June 05, 2018 FINDINGS: There is no appreciable edema or consolidation. Heart size and pulmonary vascularity are normal. No adenopathy. There is a loop recorder on the left anteriorly. There is degenerative change in the thoracic spine. IMPRESSION: No edema or consolidation.  Stable cardiac silhouette. Electronically Signed  By: Lowella Grip III M.D.   On: 02/21/2019 13:44   Nm Pulmonary Perfusion  Result Date: 02/22/2019 CLINICAL DATA:  Difficulty breathing, leg edema. Concern pulmonary embolism EXAM: NUCLEAR MEDICINE PERFUSION LUNG SCAN TECHNIQUE: Perfusion images were obtained in multiple projections after intravenous injection of radiopharmaceutical. Ventilation scans intentionally deferred if perfusion scan and chest x-ray adequate for interpretation during COVID 19 epidemic. RADIOPHARMACEUTICALS:  1.7 mCi Tc-17m MAA IV COMPARISON:  Chest radiograph FINDINGS: No wedge-shaped peripheral perfusion defects to suggest acute pulmonary embolism. Decreased perfusion seen posteriorly at the LEFT lung base is not matched companion views and may represent soft tissue attenuation. IMPRESSION: No evidence acute pulmonary embolism. Electronically Signed   By: Suzy Bouchard M.D.   On: 02/22/2019 11:52    Medications:   . apixaban  5 mg Oral BID  . atorvastatin  20 mg Oral Daily  . carbidopa-levodopa  2 tablet Oral QID  . colchicine  0.6 mg Oral BID   . DULoxetine  60 mg Oral Daily  . furosemide  40 mg Intravenous BID  . hydrocortisone  20 mg Oral Daily   And  . hydrocortisone  10 mg Oral Q1500  . levothyroxine  274 mcg Oral QAC breakfast  . [START ON 02/23/2019] pneumococcal 23 valent vaccine  0.5 mL Intramuscular Tomorrow-1000  . sodium chloride flush  3 mL Intravenous Q12H  . [START ON 02/25/2019] testosterone cypionate  150 mg Intramuscular Q14 Days  . topiramate  50 mg Oral BID   Continuous Infusions: . sodium chloride       LOS: 0 days   Geradine Girt  [facility]   How to contact the [facility] Attending or Consulting provider [place] or covering provider during after hours [place], for this patient?  1. Check the care team in [facility] and look for a) attending/consulting TR[facility] provider listed and b) the [facility] team listed 2. Log into [email or web address] and use [place]'s universal password to access. If you do not have the password, please contact the hospital operator. 3. Locate the [facility] provider you are looking for under [facility] and page to a number that you can be directly reached. 4. If you still have difficulty reaching the provider, please page the [facility] (Director on Call) for the Hospitalists listed on amion for assistance.  02/22/2019, 1:20 PM

## 2019-02-22 NOTE — Progress Notes (Signed)
  Echocardiogram 2D Echocardiogram w/ definity has been performed.  Technically difficult study due to patient body habitus.   Chosen Garron L Androw 02/22/2019, 10:15 AM

## 2019-02-23 LAB — BASIC METABOLIC PANEL
Anion gap: 8 (ref 5–15)
BUN: 24 mg/dL — ABNORMAL HIGH (ref 8–23)
CO2: 29 mmol/L (ref 22–32)
Calcium: 9 mg/dL (ref 8.9–10.3)
Chloride: 100 mmol/L (ref 98–111)
Creatinine, Ser: 1.34 mg/dL — ABNORMAL HIGH (ref 0.61–1.24)
GFR calc Af Amer: >60 mL/min (ref >60)
GFR calc non Af Amer: 52 mL/min — ABNORMAL LOW (ref >60)
Glucose, Bld: 103 mg/dL — ABNORMAL HIGH (ref 70–99)
Potassium: 3.1 mmol/L — ABNORMAL LOW (ref 3.5–5.1)
Sodium: 137 mmol/L (ref 135–145)

## 2019-02-23 LAB — CBC
HCT: 45.1 % (ref 39.0–52.0)
Hemoglobin: 14.8 g/dL (ref 13.0–17.0)
MCH: 28 pg (ref 26.0–34.0)
MCHC: 32.8 g/dL (ref 30.0–36.0)
MCV: 85.4 fL (ref 80.0–100.0)
Platelets: 178 10*3/uL (ref 150–400)
RBC: 5.28 MIL/uL (ref 4.22–5.81)
RDW: 13.4 % (ref 11.5–15.5)
WBC: 5.3 10*3/uL (ref 4.0–10.5)
nRBC: 0 % (ref 0.0–0.2)

## 2019-02-23 MED ORDER — METHOCARBAMOL 500 MG PO TABS: 500.0000 mg | ORAL_TABLET | Freq: Four times a day (QID) | ORAL | Status: DC | PRN

## 2019-02-23 MED ORDER — POTASSIUM CHLORIDE CRYS ER 20 MEQ PO TBCR
40.0000 meq | EXTENDED_RELEASE_TABLET | Freq: Once | ORAL | Status: AC
  Administered 2019-02-23: 40 meq via ORAL
  Filled 2019-02-23: qty 2

## 2019-02-23 MED ORDER — ALLOPURINOL 300 MG PO TABS
450.0000 mg | ORAL_TABLET | Freq: Every day | ORAL | Status: DC
  Administered 2019-02-23 – 2019-02-24 (×2): 450 mg via ORAL
  Filled 2019-02-23 (×2): qty 2

## 2019-02-23 MED ORDER — LIDOCAINE 5 % EX PTCH
1.0000 | MEDICATED_PATCH | CUTANEOUS | Status: DC
  Administered 2019-02-23 – 2019-02-24 (×2): 1 via TRANSDERMAL
  Filled 2019-02-23 (×2): qty 1

## 2019-02-23 NOTE — Evaluation (Signed)
Physical Therapy Evaluation Patient Details Name: Dennis Guiney Krus Sr. MRN: [id number] DOB: January 08, 1946 Today's Date: 02/23/2019   History of Present Illness  Pt is a 73 y.o. M with significant PMH of chronic diastolic congestive heart failure, paroxysmal A. fib, Addison's disease, chronic low back pain with prior back surgeries, Parkinson's, who presents to ER with complaint of shortness of breath.   Clinical Impression  Pt admitted with above diagnosis. Pt presents with decreased functional mobility secondary to chronic back and hip pain, decreased gait speed, dynamic balance impairments, decreased flexibility. Ambulating 800 feet modI, SpO2 100% on RA, HR peak 131 bpm. Pt would benefit from further OPPT with neuro specialty considering Parkinson diagnosis to address goals for pain management and maximize functional mobility.      Follow Up Recommendations Outpatient PT (NEURO)    Equipment Recommendations  None recommended by PT    Recommendations for Other Services       Precautions / Restrictions Precautions Precautions: Fall Restrictions Weight Bearing Restrictions: No      Mobility  Bed Mobility Overal bed mobility: Modified Independent                Transfers Overall transfer level: Modified independent Equipment used: None                Ambulation/Gait Ambulation/Gait assistance: Modified independent (Device/Increase time) Gait Distance (Feet): 800 Feet Assistive device: None Gait Pattern/deviations: Step-through pattern;Decreased stride length;Antalgic Gait velocity: decreased   General Gait Details: Pt with slow steady, gait. Increased antalgic pattern with increased distance. able to perform starts/stops and negotiation around obstacles without overt LOB. Unable to significantly alter gait speed  Stairs            Wheelchair Mobility    Modified Rankin (Stroke Patients Only)       Balance Overall balance assessment: Mild deficits  observed, not formally tested                                           Pertinent Vitals/Pain Pain Assessment: Faces Faces Pain Scale: Hurts even more Pain Location: L hip, back Pain Descriptors / Indicators: Constant;Grimacing;Guarding Pain Intervention(s): Monitored during session    Home Living Family/patient expects to be discharged to:: Private residence Living Arrangements: Spouse/significant other Available Help at Discharge: Family Type of Home: House Home Access: Ramped entrance     Home Layout: Able to live on main level with bedroom/bathroom Home Equipment: Cane - single point;Shower seat;Grab bars - tub/shower;Electric scooter;Wheelchair - Education administrator (comment)(U walker)      Prior Function Level of Independence: Independent               Hand Dominance        Extremity/Trunk Assessment   Upper Extremity Assessment Upper Extremity Assessment: Overall WFL for tasks assessed    Lower Extremity Assessment Lower Extremity Assessment: Overall WFL for tasks assessed       Communication   Communication: No difficulties  Cognition Arousal/Alertness: Awake/alert Behavior During Therapy: WFL for tasks assessed/performed Overall Cognitive Status: Within Functional Limits for tasks assessed                                        General Comments      Exercises Other Exercises Other Exercises: education provided on stretching  including knee to chest, piriformis, and upper back rotation   Assessment/Plan    PT Assessment Patient needs continued PT services  PT Problem List Decreased activity tolerance;Decreased balance;Decreased mobility;Pain       PT Treatment Interventions Gait training;Stair training;Functional mobility training;Therapeutic activities;Therapeutic exercise;Balance training;Patient/family education    PT Goals (Current goals can be found in the Care Plan section)  Acute Rehab PT Goals Patient  Stated Goal: "Be able to play golf again." PT Goal Formulation: With patient Time For Goal Achievement: 03/09/19 Potential to Achieve Goals: Good    Frequency Min 3X/week   Barriers to discharge        Co-evaluation               AM-PAC PT "6 Clicks" Mobility  Outcome Measure Help needed turning from your back to your side while in a flat bed without using bedrails?: None Help needed moving from lying on your back to sitting on the side of a flat bed without using bedrails?: None Help needed moving to and from a bed to a chair (including a wheelchair)?: None Help needed standing up from a chair using your arms (e.g., wheelchair or bedside chair)?: None Help needed to walk in hospital room?: None Help needed climbing 3-5 steps with a railing? : None 6 Click Score: 24    End of Session   Activity Tolerance: Patient tolerated treatment well Patient left: in bed;with call bell/phone within reach Nurse Communication: Mobility status PT Visit Diagnosis: Difficulty in walking, not elsewhere classified (R26.2)    Time: 6886-4847 PT Time Calculation (min) (ACUTE ONLY): 37 min   Charges:   PT Evaluation $PT Eval Moderate Complexity: 1 Mod PT Treatments $Therapeutic Activity: 8-22 mins        Dennis [name], PT, DPT Acute Rehabilitation Services Pager [phone] Office [phone]   Willy Eddy 02/23/2019, 5:12 PM

## 2019-02-23 NOTE — Progress Notes (Signed)
Patient ID: Dennis Fountain Sr., male   DOB: 22-Jun-1946, 73 y.o.   MRN: [id number] I have reviewed the patient's MRI of the lumbar spine.  I do not see significant adjacent level disease.  The MRI is stable from his previous MRI.  There is a small annular tear at L5-S1 that could cause back pain and radiculitis but requires no type of surgical intervention.  Recommend pain management therapy and follow-up as an outpatient

## 2019-02-23 NOTE — Progress Notes (Signed)
Progress Note    Dennis Borges Sr.  JSE:[id number] DOB: Nov 20, 1945  DOA: 02/21/2019 PCP: [name], FNP    Brief Narrative:     Medical records reviewed and are as summarized below:  Dennis Nations Ridings Sr. is an 73 y.o. male  with medical history significant of several comorbidities including chronic diastolic congestive heart failure, paroxysmal A. fib on Eliquis, Addison's disease on hydrocortisone, chronic low back pain presented to ER with a complaint of shortness of breath.  According to patient, he has been having shortness of breath since about 5 weeks which is been getting worse to the point that he feels shortness of breath even at rest.  He could not tell me if he has any orthopnea.  He also has associated bilateral lower extremity edema for the same.   Assessment/Plan:   Active Problems:   Chronic back pain   S/P lumbar spinal fusion   CAD (coronary artery disease)   Chronic diastolic CHF (congestive heart failure) (HCC)   Acquired hypothyroidism   Morbid obesity (HCC)   Essential hypertension   Adrenal insufficiency (HCC)   Paroxysmal atrial fibrillation (HCC)   Volume overload   Acute on chronic diastolic CHF (congestive heart failure) (HCC)    Acute diastolic CHF -Dyspnea/volume overload/bilateral lower extremity edema: -overnight he responded well to lasix-- will continue IV lasix with close monitoring of his Cr  -  Last echo in the chart is from December 2018 which showed normal ejection fraction:  Repeat echo shows diastolic Doppler parameters consistent with  pseudo normalization  -daily weights -strict I/Os (patient says he has been urinating a ton but nothing is documented)  Addison's disease:  -Resume home dose of hydrocortisone  Hypothyroidism:  -Resume Synthroid.  PAF: - In sinus rhythm.  Resume home medications and monitor on telemetry.  Chronic low back pain:  -Resume home tramadol-- may need to escalate to a low dose oxy if  not better with more conservative measures such as lidocaine patch -MRI was done (appreciated Dr. Adah Salvage recommendations) and do not think this was contributing to his LE Edema as it is stable  Parkinson's disease:  -Resume home dose of Sinemet.  Hyperlipidemia:  -Resume atorvastatin.  obesity Body mass index is 42.97 kg/m.   Family Communication/Anticipated D/C date and plan/Code Status   DVT prophylaxis: eliquis Code Status: Full Code.  Family Communication: wife Disposition Plan: PT eval   Medical Consultants:    None.     Subjective:   Has been urinating a bunch  Objective:    Vitals:   02/22/19 0841 02/22/19 2125 02/23/19 0048 02/23/19 0547  BP: 103/73 (!) 96/56 103/67 95/67  Pulse: 62 76 71 61  Resp: 17 17 15 15   Temp: 98 F (36.7 C) 98.6 F (37 C) 97.8 F (36.6 C) 97.6 F (36.4 C)  TempSrc: Oral Oral Oral Oral  SpO2: 97% 95% 95% 99%  Weight:    (!) 164.4 kg  Height:        Intake/Output Summary (Last 24 hours) at 02/23/2019 1403 Last data filed at 02/22/2019 1600 Gross per 24 hour  Intake 240 ml  Output --  Net 240 ml   Filed Weights   02/21/19 1906 02/22/19 0543 02/23/19 0547  Weight: (!) 168 kg (!) 164.3 kg (!) 164.4 kg    Exam: In bed, NAD rrr +LE pitting edema, rrr, chronic venous stasis changes present b/l A+Ox3   Data Reviewed:   I have personally reviewed following labs  and imaging studies:  Labs: Labs show the following:   Basic Metabolic Panel: Recent Labs  Lab 02/21/19 1306 02/21/19 1930 02/22/19 0536 02/23/19 0541  NA 137  --  138 137  K 4.3  --  3.5 3.1*  CL 103  --  102 100  CO2 24  --  27 29  GLUCOSE 123*  --  115* 103*  BUN 14  --  21 24*  CREATININE 1.37*  --  1.38* 1.34*  CALCIUM 9.9  --  9.2 9.0  MG  --  2.0  --   --    GFR Estimated Creatinine Clearance: 82.8 mL/min (A) (by C-G formula based on SCr of 1.34 mg/dL (H)). Liver Function Tests: No results for input(s): AST, ALT, ALKPHOS, BILITOT,  PROT, ALBUMIN in the last 168 hours. No results for input(s): LIPASE, AMYLASE in the last 168 hours. No results for input(s): AMMONIA in the last 168 hours. Coagulation profile No results for input(s): INR, PROTIME in the last 168 hours.  CBC: Recent Labs  Lab 02/21/19 1306 02/23/19 0541  WBC 9.9 5.3  HGB 16.3 14.8  HCT 49.9 45.1  MCV 87.4 85.4  PLT 213 178   Cardiac Enzymes: No results for input(s): CKTOTAL, CKMB, CKMBINDEX, TROPONINI in the last 168 hours. BNP (last 3 results) No results for input(s): PROBNP in the last 8760 hours. CBG: No results for input(s): GLUCAP in the last 168 hours. D-Dimer: Recent Labs    02/21/19 1742  DDIMER 1.15*   Hgb A1c: No results for input(s): HGBA1C in the last 72 hours. Lipid Profile: No results for input(s): CHOL, HDL, LDLCALC, TRIG, CHOLHDL, LDLDIRECT in the last 72 hours. Thyroid function studies: Recent Labs    02/21/19 1930  TSH 2.210   Anemia work up: No results for input(s): VITAMINB12, FOLATE, FERRITIN, TIBC, IRON, RETICCTPCT in the last 72 hours. Sepsis Labs: Recent Labs  Lab 02/21/19 1306 02/23/19 0541  WBC 9.9 5.3    Microbiology Recent Results (from the past 240 hour(s))  SARS CORONAVIRUS 2 Nasal Swab Aptima Multi Swab     Status: None   Collection Time: 02/21/19  4:12 PM   Specimen: Aptima Multi Swab; Nasal Swab  Result Value Ref Range Status   SARS Coronavirus 2 NEGATIVE NEGATIVE Final    Comment: (NOTE) SARS-CoV-2 target nucleic acids are NOT DETECTED. The SARS-CoV-2 RNA is generally detectable in upper and lower respiratory specimens during the acute phase of infection. Negative results do not preclude SARS-CoV-2 infection, do not rule out co-infections with other pathogens, and should not be used as the sole basis for treatment or other patient management decisions. Negative results must be combined with clinical observations, patient history, and epidemiological information. The expected result is  Negative. Fact Sheet for Patients: SugarRoll.be Fact Sheet for Healthcare Providers: https://www.woods-mathews.com/ This test is not yet approved or cleared by the [place] FDA and  has been authorized for detection and/or diagnosis of SARS-CoV-2 by FDA under an Emergency Use Authorization (EUA). This EUA will remain  in effect (meaning this test can be used) for the duration of the COVID-19 declaration under Section 56 4(b)(1) of the Act, 21 U.S.C. section 360bbb-3(b)(1), unless the authorization is terminated or revoked sooner. Performed at [facility], [place], [place] [address], [place], [place] [address]     Procedures and diagnostic studies:  Mr Lumbar Spine W Wo Contrast  Result Date: 02/22/2019 CLINICAL DATA:  Chronic low back pain. EXAM: MRI LUMBAR SPINE WITHOUT AND WITH CONTRAST  TECHNIQUE: Multiplanar and multiecho pulse sequences of the lumbar spine were obtained without and with intravenous contrast. CONTRAST:  10 mL Gadavist intravenous contrast. COMPARISON:  MRI lumbar spine dated July 19, 2018. FINDINGS: Segmentation:  Standard. Alignment:  Physiologic. Vertebrae: No fracture, evidence of discitis, or bone lesion. Prior L2-L5 PLIF. Conus medullaris and cauda equina: Conus extends to the L2 level. Conus and cauda equina appear normal. No intradural enhancement. Paraspinal and other soft tissues: Paraspinous postsurgical changes. Otherwise negative. Disc levels: T12-L1:  Negative. L1-L2: Unchanged minimal disc bulging and mild bilateral facet arthropathy. No stenosis. L2-L3: Prior posterior decompression and fusion. Unchanged focal spurring in the right lateral recess potentially affecting the descending right L3 nerve root. No spinal canal or neuroforaminal stenosis. L3-L4: Prior PLIF. Unchanged right foraminal disc protrusion. No stenosis. L4-L5: Prior posterior decompression and fusion. Unchanged mild circumferential endplate spurring.  No stenosis. L5-S1: Unchanged broad-based posterior disc protrusion with increased central annular fissure. Unchanged mild bilateral facet arthropathy. Unchanged mild bilateral neuroforaminal stenosis. No spinal canal stenosis. IMPRESSION: 1. Unchanged L2-L5 PLIF. Unchanged focal spurring in the right lateral recess at L2-L3 potentially affecting the descending right L3 nerve root. 2. Unchanged mild bilateral neuroforaminal stenosis at L5-S1. Electronically Signed   By: Titus Dubin M.D.   On: 02/22/2019 15:32   Nm Pulmonary Perfusion  Result Date: 02/22/2019 CLINICAL DATA:  Difficulty breathing, leg edema. Concern pulmonary embolism EXAM: NUCLEAR MEDICINE PERFUSION LUNG SCAN TECHNIQUE: Perfusion images were obtained in multiple projections after intravenous injection of radiopharmaceutical. Ventilation scans intentionally deferred if perfusion scan and chest x-ray adequate for interpretation during COVID 19 epidemic. RADIOPHARMACEUTICALS:  1.7 mCi Tc-5m MAA IV COMPARISON:  Chest radiograph FINDINGS: No wedge-shaped peripheral perfusion defects to suggest acute pulmonary embolism. Decreased perfusion seen posteriorly at the LEFT lung base is not matched companion views and may represent soft tissue attenuation. IMPRESSION: No evidence acute pulmonary embolism. Electronically Signed   By: Suzy Bouchard M.D.   On: 02/22/2019 11:52    Medications:    allopurinol  450 mg Oral Daily   apixaban  5 mg Oral BID   atorvastatin  20 mg Oral Daily   carbidopa-levodopa  2 tablet Oral QID   colchicine  0.6 mg Oral BID   DULoxetine  60 mg Oral Daily   furosemide  40 mg Intravenous BID   hydrocortisone  20 mg Oral Daily   And   hydrocortisone  10 mg Oral Q1500   levothyroxine  274 mcg Oral QAC breakfast   lidocaine  1 patch Transdermal Q24H   pneumococcal 23 valent vaccine  0.5 mL Intramuscular Tomorrow-1000   sodium chloride flush  3 mL Intravenous Q12H   topiramate  50 mg Oral BID    Continuous Infusions:  sodium chloride       LOS: 1 day   Geradine Girt  [facility]   How to contact the [facility] Attending or Consulting provider [place] or covering provider during after hours [place], for this patient?  1. Check the care team in [facility] and look for a) attending/consulting [facility] provider listed and b) the [facility] team listed 2. Log into [email or web address] and use [place]'s universal password to access. If you do not have the password, please contact the hospital operator. 3. Locate the [facility] provider you are looking for under [facility] and page to a number that you can be directly reached. 4. If you still have difficulty reaching the provider, please page the [facility] (Director on Call) for  the Hospitalists listed on amion for assistance.  02/23/2019, 2:03 PM

## 2019-02-24 ENCOUNTER — Encounter: Payer: Self-pay | Admitting: Family Medicine

## 2019-02-24 DIAGNOSIS — E2740 Unspecified adrenocortical insufficiency: Secondary | ICD-10-CM

## 2019-02-24 LAB — BASIC METABOLIC PANEL
Anion gap: 12 (ref 5–15)
BUN: 25 mg/dL — ABNORMAL HIGH (ref 8–23)
CO2: 27 mmol/L (ref 22–32)
Calcium: 9.5 mg/dL (ref 8.9–10.3)
Chloride: 98 mmol/L (ref 98–111)
Creatinine, Ser: 1.38 mg/dL — ABNORMAL HIGH (ref 0.61–1.24)
GFR calc Af Amer: 58 mL/min — ABNORMAL LOW (ref >60)
GFR calc non Af Amer: 50 mL/min — ABNORMAL LOW (ref >60)
Glucose, Bld: 114 mg/dL — ABNORMAL HIGH (ref 70–99)
Potassium: 3.4 mmol/L — ABNORMAL LOW (ref 3.5–5.1)
Sodium: 137 mmol/L (ref 135–145)

## 2019-02-24 MED ORDER — FUROSEMIDE 40 MG PO TABS: 40.0000 mg | ORAL_TABLET | Freq: Every day | ORAL | 1 refills | Status: DC

## 2019-02-24 MED ORDER — LIDOCAINE 5 % EX PTCH: 1.0000 | MEDICATED_PATCH | CUTANEOUS | 0 refills | Status: DC

## 2019-02-24 MED ORDER — POTASSIUM CHLORIDE CRYS ER 20 MEQ PO TBCR
40.0000 meq | EXTENDED_RELEASE_TABLET | Freq: Once | ORAL | Status: AC
  Administered 2019-02-24: 40 meq via ORAL
  Filled 2019-02-24: qty 2

## 2019-02-24 NOTE — Progress Notes (Signed)
Late entry- at shift change instructed pt to use urinal for intake and output " I was using it but nobody was empting so I stopped" instructed to call and we would empty and apologized

## 2019-02-24 NOTE — Discharge Instructions (Addendum)

## 2019-02-24 NOTE — Discharge Summary (Signed)
Physician Discharge Summary  Dennis Abdelaziz Jarrett Sr. DPO:[id number] DOB: 1945/12/28 DOA: 02/21/2019  PCP: [name], FNP  Admit date: 02/21/2019 Discharge date: 02/24/2019  Admitted From: home Discharge disposition: home   Recommendations for Outpatient Follow-Up:   Outpatient PT BMP 1 week Consider TED hose Daily weights Titrate lasix up vs down or perhaps PRN  Discharge Diagnosis:   Active Problems:   Chronic back pain   S/P lumbar spinal fusion   CAD (coronary artery disease)   Chronic diastolic CHF (congestive heart failure) (HCC)   Acquired hypothyroidism   Morbid obesity ([place])   Essential hypertension   Adrenal insufficiency (HCC)   Paroxysmal atrial fibrillation (HCC)   Volume overload   Acute on chronic diastolic CHF (congestive heart failure) ([place])    Discharge Condition: Improved.  Diet recommendation: Low sodium, heart healthy.  Wound care: None.  Code status: Full.   History of Present Illness:   Dennis Wence Radell Sr. is a 73 y.o. male with medical history significant of several comorbidities including chronic diastolic congestive heart failure, paroxysmal A. fib on Eliquis, Addison's disease on hydrocortisone, chronic low back pain presented to ER with a complaint of shortness of breath.  According to patient, he has been having shortness of breath since about 5 weeks which is been getting worse to the point that he feels shortness of breath even at rest.  He could not tell me if he has any orthopnea.  He also has associated bilateral lower extremity edema for the same.  Of time and he claims that he has gained approximately 35 pounds of weight in about same time.  He denies any chest pain, headache, dizziness, fever, chills, sweating, nausea, vomiting, any problem with urination or bowel movement.  He called his cardiologist today with those symptoms and he was asked to go to the emergency department.  He also tells me that he was seen by his  PCP within last 1 month and was thought to be having bilateral lower extremity cellulitis and was treated with Bactrim DS.  He has recently been started on tapering prednisone pack by his neurologist/neurosurgeon for low back pain.   Hospital Course by Problem:   Acute diastolic CHF -Dyspnea/volume overload/bilateral lower extremity edema: -overnight he responded well to lasix-- will continue IV lasix with close monitoring of his Cr - Last echo in the chart is from December 2018 which showed normal ejection fraction:  Repeat echo shows diastolic Doppler parameters consistent with  pseudo normalization  -daily weights- weight down quite a bt -strict I/Os (patient says he has been urinating a ton but nothing is documented)  Addison's disease: -Resume home dose of hydrocortisone  Hypothyroidism: -Resume Synthroid.  PAF: -In sinus rhythm. Resume home medications and monitor on telemetry.  Chronic low back pain: -Resume home tramadol-- did well with lidocaine patch -MRI was done (appreciated Dr. Adah Salvage recommendations) and do not think this was contributing to his LE Edema as it is stable  Parkinson's disease: -Resume home dose of Sinemet.  Hyperlipidemia: -Resume atorvastatin.  obesity Body mass index is 42.97 kg/m.    Medical Consultants:      Discharge Exam:   Vitals:   02/24/19 0427 02/24/19 0918  BP: 111/70 115/71  Pulse: 76 63  Resp: 18 16  Temp: 97.7 F (36.5 C)   SpO2: 97% 99%   Vitals:   02/23/19 0547 02/23/19 1944 02/24/19 0427 02/24/19 0918  BP: 95/67 113/73 111/70 115/71  Pulse: 61 70 76  63  Resp: 15 18 18 16   Temp: 97.6 F (36.4 C) 98 F (36.7 C) 97.7 F (36.5 C)   TempSrc: Oral Oral Oral   SpO2: 99% 97% 97% 99%  Weight: (!) 164.4 kg  (!) 162.6 kg   Height:        General exam: Appears calm and comfortable.   The results of significant diagnostics from this hospitalization (including imaging, microbiology, ancillary and  laboratory) are listed below for reference.     Procedures and Diagnostic Studies:   Dg Chest 2 View  Result Date: 02/21/2019 CLINICAL DATA:  Shortness of breath EXAM: CHEST - 2 VIEW COMPARISON:  June 05, 2018 FINDINGS: There is no appreciable edema or consolidation. Heart size and pulmonary vascularity are normal. No adenopathy. There is a loop recorder on the left anteriorly. There is degenerative change in the thoracic spine. IMPRESSION: No edema or consolidation.  Stable cardiac silhouette. Electronically Signed   By: Lowella Grip III M.D.   On: 02/21/2019 13:44   Mr Lumbar Spine W Wo Contrast  Result Date: 02/22/2019 CLINICAL DATA:  Chronic low back pain. EXAM: MRI LUMBAR SPINE WITHOUT AND WITH CONTRAST TECHNIQUE: Multiplanar and multiecho pulse sequences of the lumbar spine were obtained without and with intravenous contrast. CONTRAST:  10 mL Gadavist intravenous contrast. COMPARISON:  MRI lumbar spine dated July 19, 2018. FINDINGS: Segmentation:  Standard. Alignment:  Physiologic. Vertebrae: No fracture, evidence of discitis, or bone lesion. Prior L2-L5 PLIF. Conus medullaris and cauda equina: Conus extends to the L2 level. Conus and cauda equina appear normal. No intradural enhancement. Paraspinal and other soft tissues: Paraspinous postsurgical changes. Otherwise negative. Disc levels: T12-L1:  Negative. L1-L2: Unchanged minimal disc bulging and mild bilateral facet arthropathy. No stenosis. L2-L3: Prior posterior decompression and fusion. Unchanged focal spurring in the right lateral recess potentially affecting the descending right L3 nerve root. No spinal canal or neuroforaminal stenosis. L3-L4: Prior PLIF. Unchanged right foraminal disc protrusion. No stenosis. L4-L5: Prior posterior decompression and fusion. Unchanged mild circumferential endplate spurring. No stenosis. L5-S1: Unchanged broad-based posterior disc protrusion with increased central annular fissure. Unchanged mild  bilateral facet arthropathy. Unchanged mild bilateral neuroforaminal stenosis. No spinal canal stenosis. IMPRESSION: 1. Unchanged L2-L5 PLIF. Unchanged focal spurring in the right lateral recess at L2-L3 potentially affecting the descending right L3 nerve root. 2. Unchanged mild bilateral neuroforaminal stenosis at L5-S1. Electronically Signed   By: Titus Dubin M.D.   On: 02/22/2019 15:32   Nm Pulmonary Perfusion  Result Date: 02/22/2019 CLINICAL DATA:  Difficulty breathing, leg edema. Concern pulmonary embolism EXAM: NUCLEAR MEDICINE PERFUSION LUNG SCAN TECHNIQUE: Perfusion images were obtained in multiple projections after intravenous injection of radiopharmaceutical. Ventilation scans intentionally deferred if perfusion scan and chest x-ray adequate for interpretation during COVID 19 epidemic. RADIOPHARMACEUTICALS:  1.7 mCi Tc-61m MAA IV COMPARISON:  Chest radiograph FINDINGS: No wedge-shaped peripheral perfusion defects to suggest acute pulmonary embolism. Decreased perfusion seen posteriorly at the LEFT lung base is not matched companion views and may represent soft tissue attenuation. IMPRESSION: No evidence acute pulmonary embolism. Electronically Signed   By: Suzy Bouchard M.D.   On: 02/22/2019 11:52     Labs:   Basic Metabolic Panel: Recent Labs  Lab 02/21/19 1306 02/21/19 1930 02/22/19 0536 02/23/19 0541 02/24/19 0445  NA 137  --  138 137 137  K 4.3  --  3.5 3.1* 3.4*  CL 103  --  102 100 98  CO2 24  --  27 29 27   GLUCOSE  123*  --  115* 103* 114*  BUN 14  --  21 24* 25*  CREATININE 1.37*  --  1.38* 1.34* 1.38*  CALCIUM 9.9  --  9.2 9.0 9.5  MG  --  2.0  --   --   --    GFR Estimated Creatinine Clearance: 79.9 mL/min (A) (by C-G formula based on SCr of 1.38 mg/dL (H)). Liver Function Tests: No results for input(s): AST, ALT, ALKPHOS, BILITOT, PROT, ALBUMIN in the last 168 hours. No results for input(s): LIPASE, AMYLASE in the last 168 hours. No results for input(s):  AMMONIA in the last 168 hours. Coagulation profile No results for input(s): INR, PROTIME in the last 168 hours.  CBC: Recent Labs  Lab 02/21/19 1306 02/23/19 0541  WBC 9.9 5.3  HGB 16.3 14.8  HCT 49.9 45.1  MCV 87.4 85.4  PLT 213 178   Cardiac Enzymes: No results for input(s): CKTOTAL, CKMB, CKMBINDEX, TROPONINI in the last 168 hours. BNP: Invalid input(s): POCBNP CBG: No results for input(s): GLUCAP in the last 168 hours. D-Dimer Recent Labs    02/21/19 1742  DDIMER 1.15*   Hgb A1c No results for input(s): HGBA1C in the last 72 hours. Lipid Profile No results for input(s): CHOL, HDL, LDLCALC, TRIG, CHOLHDL, LDLDIRECT in the last 72 hours. Thyroid function studies Recent Labs    02/21/19 1930  TSH 2.210   Anemia work up No results for input(s): VITAMINB12, FOLATE, FERRITIN, TIBC, IRON, RETICCTPCT in the last 72 hours. Microbiology Recent Results (from the past 240 hour(s))  SARS CORONAVIRUS 2 Nasal Swab Aptima Multi Swab     Status: None   Collection Time: 02/21/19  4:12 PM   Specimen: Aptima Multi Swab; Nasal Swab  Result Value Ref Range Status   SARS Coronavirus 2 NEGATIVE NEGATIVE Final    Comment: (NOTE) SARS-CoV-2 target nucleic acids are NOT DETECTED. The SARS-CoV-2 RNA is generally detectable in upper and lower respiratory specimens during the acute phase of infection. Negative results do not preclude SARS-CoV-2 infection, do not rule out co-infections with other pathogens, and should not be used as the sole basis for treatment or other patient management decisions. Negative results must be combined with clinical observations, patient history, and epidemiological information. The expected result is Negative. Fact Sheet for Patients: SugarRoll.be Fact Sheet for Healthcare Providers: https://www.woods-mathews.com/ This test is not yet approved or cleared by the [place] FDA and  has been authorized for  detection and/or diagnosis of SARS-CoV-2 by FDA under an Emergency Use Authorization (EUA). This EUA will remain  in effect (meaning this test can be used) for the duration of the COVID-19 declaration under Section 56 4(b)(1) of the Act, 21 U.S.C. section 360bbb-3(b)(1), unless the authorization is terminated or revoked sooner. Performed at [facility], [place] [address] [place], [place] [address]      Discharge Instructions:   Discharge Instructions    (HEART FAILURE PATIENTS) Call MD:  Anytime you have any of the following symptoms: 1) 3 pound weight gain in 24 hours or 5 pounds in 1 week 2) shortness of breath, with or without a dry hacking cough 3) swelling in the hands, feet or stomach 4) if you have to sleep on extra pillows at night in order to breathe.   Complete by: As directed    Ambulatory referral to Physical Therapy   Complete by: As directed    Neuro rehab   Diet - low sodium heart healthy   Complete by: As  directed    Discharge instructions   Complete by: As directed    Outpatient neuro PT Aspercreme Lidocaine Back Patch if prescription medication too costly May benefit from compression stocking-- discuss with PCP   Increase activity slowly   Complete by: As directed      Allergies as of 02/24/2019      Reactions   Bee Venom Anaphylaxis   Shrimp [shellfish Allergy] Anaphylaxis, Other (See Comments)   "just shrimp"   Stadol [butorphanol] Anaphylaxis, Other (See Comments)   respiratory  Distress, couldn't breathe, cardiac arrest   Wasp Venom Anaphylaxis      Medication List    STOP taking these medications   methylPREDNISolone 4 MG Tbpk tablet Commonly known as: MEDROL DOSEPAK   sulfamethoxazole-trimethoprim 800-160 MG tablet Commonly known as: BACTRIM DS     TAKE these medications   ALLOPURINOL PO Take 450 mg by mouth daily.   apixaban 5 MG Tabs tablet Commonly known as: Eliquis Take 1 tablet (5 mg total) by mouth 2 (two) times daily.     atorvastatin 20 MG tablet Commonly known as: LIPITOR TAKE 1 TABLET(20 MG) BY MOUTH DAILY What changed: See the new instructions.   carbidopa-levodopa 25-250 MG tablet Commonly known as: SINEMET IR Take 2 tablets by mouth 4 (four) times daily.   colchicine 0.6 MG tablet TAKE 1 TABLET BY MOUTH TWICE DAILY   DULoxetine 60 MG capsule Commonly known as: CYMBALTA Take 1 capsule (60 mg total) by mouth daily.   EPINEPHrine 0.3 mg/0.3 mL Soaj injection Commonly known as: EPI-PEN Inject 0.3 mLs (0.3 mg total) into the muscle as needed (allergic reaction).   furosemide 40 MG tablet Commonly known as: Lasix Take 1 tablet (40 mg total) by mouth daily.   hydrocortisone 10 MG tablet Commonly known as: CORTEF TAKE 2 TABLETS BY MOUTH EVERY MORNING AND 1 TABLET AT 5 PM EVERY DAY What changed:   how much to take  how to take this  when to take this  additional instructions   levothyroxine 137 MCG tablet Commonly known as: SYNTHROID TAKE 2 TABLETS BY MOUTH DAILY BEFORE BREAKFAST What changed:   how much to take  how to take this  when to take this   lidocaine 5 % Commonly known as: LIDODERM Place 1 patch onto the skin daily. Remove & Discard patch within 12 hours or as directed by MD Start taking on: February 25, 2019   midodrine 5 MG tablet Commonly known as: PROAMATINE Take 1 tablet (5 mg total) by mouth daily as needed (if blood pressure drop <90/40).   multivitamin with minerals Tabs tablet Take 1 tablet by mouth daily.   potassium chloride 10 MEQ tablet Commonly known as: K-DUR Take 1 tablet (10 mEq total) by mouth daily.   testosterone cypionate 200 MG/ML injection Commonly known as: Depo-Testosterone Inject 0.75 mLs (150 mg total) into the muscle every 14 (fourteen) days.   topiramate 50 MG tablet Commonly known as: TOPAMAX Take 1 tablet (50 mg total) by mouth 2 (two) times daily.   traMADol 50 MG tablet Commonly known as: ULTRAM Take 50 mg by mouth as  needed.   VITAMIN D PO Take 500 Units by mouth daily.      Follow-up Information    [name], FNP Follow up in 1 week(s).   Specialties: Nurse Practitioner, Family Medicine Why: BP check and BMP Contact information: [place] [address] [phone]        Lauree Chandler  D, MD .   Specialty: Cardiology Contact information: [address] [place] [address] [phone]        [facility] Follow up.   Specialty: Rehabilitation Contact information: [place] [id number] [address] [place] [phone]           Time coordinating discharge: 35 min  Signed:  Geradine Girt DO  [facility] 02/24/2019, 2:05 PM

## 2019-02-24 NOTE — TOC Transition Note (Signed)
Transition of Care [facility]) - CM/SW Discharge Note   Patient Details  Name: Dennis Pocock Macchi Sr. MRN: [id number] Date of Birth: Mar 04, 1946  [facility] CM/SW Contact:  Carles Collet, RN Phone Number: 02/24/2019, 10:49 AM   Clinical Narrative:   Referral placed to [facility] OP Neuro Pt per patient request.  Patient lives at home with wife and son. Denies barriers to care, medication, follow up access. Patient had access to scale No other CM needs identified.     Final next level of care: Home/Self Care Barriers to Discharge: No Barriers Identified   Patient Goals and CMS Choice        Discharge Placement                       Discharge Plan and Services                                     Social Determinants of Health (SDOH) Interventions     Readmission Risk Interventions No flowsheet data found.

## 2019-02-25 ENCOUNTER — Telehealth: Payer: Self-pay

## 2019-02-25 ENCOUNTER — Encounter: Payer: Self-pay | Admitting: Family Medicine

## 2019-02-25 NOTE — Telephone Encounter (Signed)
Transition Care Management Follow-up Telephone Call   Date discharged? 02/24/19   How have you been since you were released from the hospital?  Doing okay.  He is using the 4% Lidocaine patch OTC and it is helping with the pain.    Do you understand why you were in the hospital? Yes   Do you understand the discharge instructions? Yes   Where were you discharged to? Home    Items Reviewed:  Medications reviewed: Yes  Allergies reviewed: Yes  Dietary changes reviewed: Heart healthy, low fat/sodium watch sugars  Referrals reviewed: Has follow up with neurology, cardiology and orthopedics    Functional Questionnaire:   Activities of Daily Living (ADLs):   He states they are independent in the following: dressing, bathing, grooming, toileting, ambulation, feeding and meal prep, medication management.  States they require assistance with the following:  He does not require any ADL assistance.    Any transportation issues/concerns?: none        Any patient concerns? He will talk with Tor Netters, NP about his medication questions when comes in for follow up on Wednesday.  No questions at the present.    Confirmed importance and date/time of follow-up visits scheduled: Yes  Provider Appointment booked with Tor Netters, NP at 11am.   Confirmed with patient if condition begins to worsen call PCP or go to the ER.  Patient was given the office number and encouraged to call back with question or concerns.  : Yes

## 2019-02-25 NOTE — Telephone Encounter (Signed)
Noted  

## 2019-02-25 NOTE — Telephone Encounter (Signed)
Pt scheduled Wed 02/27/2019 at 11am with [place] for your [facility] Follow up

## 2019-02-27 ENCOUNTER — Other Ambulatory Visit: Payer: Self-pay

## 2019-02-27 ENCOUNTER — Telehealth: Payer: Self-pay | Admitting: Family Medicine

## 2019-02-27 ENCOUNTER — Encounter: Payer: Self-pay | Admitting: Family Medicine

## 2019-02-27 ENCOUNTER — Ambulatory Visit (INDEPENDENT_AMBULATORY_CARE_PROVIDER_SITE_OTHER): Payer: Medicare Other | Admitting: Family Medicine

## 2019-02-27 VITALS — BP 112/74 | HR 111 | Temp 98.4°F | Ht 77.0 in | Wt 361.1 lb

## 2019-02-27 DIAGNOSIS — M5442 Lumbago with sciatica, left side: Secondary | ICD-10-CM

## 2019-02-27 DIAGNOSIS — I5031 Acute diastolic (congestive) heart failure: Secondary | ICD-10-CM

## 2019-02-27 DIAGNOSIS — M5441 Lumbago with sciatica, right side: Secondary | ICD-10-CM

## 2019-02-27 DIAGNOSIS — G8929 Other chronic pain: Secondary | ICD-10-CM | POA: Diagnosis not present

## 2019-02-27 DIAGNOSIS — Z09 Encounter for follow-up examination after completed treatment for conditions other than malignant neoplasm: Secondary | ICD-10-CM

## 2019-02-27 LAB — BASIC METABOLIC PANEL
BUN: 23 mg/dL (ref 6–23)
CO2: 29 mEq/L (ref 19–32)
Calcium: 10 mg/dL (ref 8.4–10.5)
Chloride: 100 mEq/L (ref 96–112)
Creatinine, Ser: 1.36 mg/dL (ref 0.40–1.50)
GFR: 51.31 mL/min — ABNORMAL LOW (ref >60.00)
Glucose, Bld: 113 mg/dL — ABNORMAL HIGH (ref 70–99)
Potassium: 3.9 mEq/L (ref 3.5–5.1)
Sodium: 137 mEq/L (ref 135–145)

## 2019-02-27 NOTE — Telephone Encounter (Signed)
I have placed a hand written prescription on your desk. Please fax when you can.

## 2019-02-27 NOTE — Telephone Encounter (Signed)
Pt calling on 3-way call with Langley Gauss at [facility].  Requesting an order be faxed over for a pressure change to his CPAP machine.  Pt current pressure is at 8-10 and this is needing to be increased to 10-12. Roselyn Reef states that she is going to go ahead and make the pressure change and will just need a faxed order to finalize everything.    Fax number to [place] [phone] ATTN: Langley Gauss

## 2019-02-27 NOTE — Progress Notes (Signed)
Subjective:    Patient ID: Dennis Fountain Sr., male    DOB: February 08, 1946, 73 y.o.   MRN: [id number]  HPI This is a 73 yo male, accompanied by his wife, who presents today for hospital follow up. He was admitted 8/13-16/2020 for shortness of breath, bilateral lower extremity edema and weight gain. He was treated with diuretics for acute diastolic CHF. He had echo that showed diastolic doppler parameters consistent with pseudo normalization.  He was significantly diuresed with IV furosemide.  He reports since getting home his weight has been stable and he is not having any lower extremity edema.  He is doing daily weights.  He does complain of feeling very dry with dry mouth.  He has had a little bit of positional lightheadedness that resolves within a few seconds.  He is concerned that his blood pressure is running low with increased diuretic dosage.  He has had problems in the past with positional hypertension.  He had acute on chronic worsening back pain and had MRI on 02/22/2019.  It was essentially unchanged from previous MRI.  He is having pain midline lumbar region and is using lidocaine patches with improvement.  He is particularly stiff in the mornings and once he gets moving around and applies lidocaine patch does much better.  Physical therapy has been ordered.  He has been increasingly sedentary. Able to ambulate around his home with a walker and perform ADLs. His wife and son do a lot for him.   ROS- denies CP, SOB, no diarrhea/constipation, dysuria, some frequency in am after furosemide.   Current Outpatient Medications  Medication Instructions  . ALLOPURINOL PO 450 mg, Oral, Daily  . apixaban (ELIQUIS) 5 mg, Oral, 2 times daily  . atorvastatin (LIPITOR) 20 MG tablet TAKE 1 TABLET(20 MG) BY MOUTH DAILY  . carbidopa-levodopa (SINEMET IR) 25-250 MG tablet 2 tablets, Oral, 4 times daily  . Cholecalciferol (VITAMIN D PO) 500 Units, Oral, Daily  . colchicine 0.6 MG tablet TAKE 1 TABLET BY  MOUTH TWICE DAILY  . DULoxetine (CYMBALTA) 60 mg, Oral, Daily  . EPINEPHrine (EPI-PEN) 0.3 mg, Intramuscular, As needed  . furosemide (LASIX) 40 mg, Oral, Daily  . hydrocortisone (CORTEF) 10 MG tablet TAKE 2 TABLETS BY MOUTH EVERY MORNING AND 1 TABLET AT 5 PM EVERY DAY  . levothyroxine (SYNTHROID) 137 MCG tablet TAKE 2 TABLETS BY MOUTH DAILY BEFORE BREAKFAST  . Lidocaine (HM LIDOCAINE PATCH) 4 %, Apply externally, Daily, Remove after 12 hours.  . lidocaine (LIDODERM) 5 % 1 patch, Transdermal, Every 24 hours, Remove & Discard patch within 12 hours or as directed by MD  . midodrine (PROAMATINE) 5 mg, Oral, Daily PRN  . Multiple Vitamin (MULTIVITAMIN WITH MINERALS) TABS tablet 1 tablet, Oral, Daily  . potassium chloride (K-DUR) 10 MEQ tablet 10 mEq, Oral, Daily  . testosterone cypionate (DEPO-TESTOSTERONE) 150 mg, Intramuscular, Every 14 days  . topiramate (TOPAMAX) 50 mg, Oral, 2 times daily  . traMADol (ULTRAM) 50 mg, Oral, As needed     Past Medical History:  Diagnosis Date  . Addison's disease ([place]) 01/2016  . Arthritis    low back - DDD  . Cataract 2019   corrected with surgery  . Cellulitis, scrotum 08/02/2014  . Chronic diastolic CHF (congestive heart failure) ([place])    Echo 12/18: severe LVH, EF 60-65, Gr 1 DD // Echo 5/18: EF 65-70, Gr 1 DD  . Chronic lower back pain    "from [place] 2007"  . Complication of anesthesia  Sometimes has N&V /w anesth.   . Coronary artery disease    NSTEMI >> LHC 9/01: prox and mid LAD 50-70; mRCA 40 >> med Rx // Nuc 8/13 [facility]):  no infarct or ischemia, EF 59  . Elevated PSA   . Epididymitis, left 08/04/2014  . History of chronic bronchitis   . History of gout   . History of stroke 01/22/2016   2004 - "right brain stem; no residual " // 2017  . Hypertension   . Hypocholesteremia   . Hypothyroidism   . Infection of skin due to methicillin resistant Staphylococcus aureus (MRSA) 12/28/2017  . Kidney stone   . Myocardial infarction [facility]) 2001    2001- cardiac cath., cardiac clearanece note dr Otho Perl 05-14-13 on chart, stress test results 02-21-12 on chart  . OSA on CPAP    cpap setting of 10  . Parkinson's disease ([place])    stage 4   . Pneumonia 2000's and 2013  . PONV (postoperative nausea and vomiting)   . [facility] spotted fever   . Testicular cancer ([place]) 2015    Review of Systems Per HPI    Objective:   Physical Exam Vitals signs reviewed.  Constitutional:      General: He is not in acute distress.    Appearance: Normal appearance. He is obese. He is ill-appearing (chronically). He is not toxic-appearing or diaphoretic.  HENT:     Head: Normocephalic and atraumatic.  Eyes:     Conjunctiva/sclera: Conjunctivae normal.  Neck:     Musculoskeletal: Normal range of motion and neck supple.  Cardiovascular:     Rate and Rhythm: Normal rate and regular rhythm.     Heart sounds: Normal heart sounds.     Comments: Normal heart rate on auscultation. Pulmonary:     Effort: Pulmonary effort is normal.     Breath sounds: Normal breath sounds.  Musculoskeletal:     Right lower leg: No edema.     Left lower leg: No edema.     Comments: Slightly ataxic gait.  He did walk from the exam room to the waiting room without assistance, and was able to get onto exam table without assistance.  Mild tenderness across lumbar back.  Lidocaine patch in place.  Skin:    General: Skin is warm and dry.     Comments: Lower extremities with chronic hyperpigmentation.   Neurological:     Mental Status: He is alert and oriented to person, place, and time.  Psychiatric:        Mood and Affect: Mood normal.        Behavior: Behavior normal.        Thought Content: Thought content normal.        Judgment: Judgment normal.       BP 112/74 (BP Location: Left Arm, Patient Position: Sitting, Cuff Size: Large)   Pulse (!) 111   Temp 98.4 F (36.9 C) (Temporal)   Ht 6\' 5"  (1.956 m)   Wt (!) 361 lb 1.9 oz (163.8 kg)   SpO2 98%   BMI 42.82  kg/m  Wt Readings from Last 3 Encounters:  02/27/19 (!) 361 lb 1.9 oz (163.8 kg)  02/24/19 (!) 358 lb 8 oz (162.6 kg)  12/31/18 (!) 371 lb 12 oz (168.6 kg)       Assessment & Plan:  1. Hospital discharge follow-up - patient understands hospital course and discharge instructions. PT has been ordered. They feel that their home is well equipped and decline  OT evaluation. Medications reviewed.   2. Acute diastolic heart failure ([place]) - significantly improved with diuresis, discussed importance of daily weights, will decrease furosemide to 1/2 tablet daily and increase if weight up by 3 pounds or more - Basic Metabolic Panel  3. Chronic bilateral low back pain with bilateral sciatica - PT pending, encouraged him to increase activity - lidocaine (LIDODERM) 5 %; Place 1 patch onto the skin daily. Remove & Discard patch within 12 hours or as directed by MD  Dispense: 30 patch; Refill: 2   Clarene Reamer, FNP-BC   Primary Care at [facility]  03/02/2019 8:48 AM

## 2019-02-27 NOTE — Patient Instructions (Signed)
Decrease your furosemide to 1/2 tablet daily, if weight increases more than 3 pounds in a day, take a full tablet.

## 2019-02-28 NOTE — Telephone Encounter (Signed)
Rx for CPAP pressure change faxed back to [phone] Attn: Langley Gauss. Placed to scan  Nothing further needed.

## 2019-03-02 ENCOUNTER — Encounter: Payer: Self-pay | Admitting: Family Medicine

## 2019-03-02 MED ORDER — LIDOCAINE 5 % EX PTCH: 1.0000 | MEDICATED_PATCH | CUTANEOUS | 2 refills | Status: DC

## 2019-03-03 ENCOUNTER — Other Ambulatory Visit: Payer: Self-pay | Admitting: Family Medicine

## 2019-03-04 ENCOUNTER — Other Ambulatory Visit: Payer: Self-pay | Admitting: Family Medicine

## 2019-03-04 ENCOUNTER — Telehealth: Payer: Self-pay | Admitting: Family Medicine

## 2019-03-04 ENCOUNTER — Other Ambulatory Visit: Payer: Self-pay

## 2019-03-04 ENCOUNTER — Other Ambulatory Visit (INDEPENDENT_AMBULATORY_CARE_PROVIDER_SITE_OTHER): Payer: Medicare Other

## 2019-03-04 DIAGNOSIS — M79645 Pain in left finger(s): Secondary | ICD-10-CM | POA: Insufficient documentation

## 2019-03-04 DIAGNOSIS — E230 Hypopituitarism: Secondary | ICD-10-CM

## 2019-03-04 DIAGNOSIS — E039 Hypothyroidism, unspecified: Secondary | ICD-10-CM

## 2019-03-04 DIAGNOSIS — R350 Frequency of micturition: Secondary | ICD-10-CM

## 2019-03-04 DIAGNOSIS — R6883 Chills (without fever): Secondary | ICD-10-CM

## 2019-03-04 DIAGNOSIS — M10442 Other secondary gout, left hand: Secondary | ICD-10-CM | POA: Diagnosis not present

## 2019-03-04 LAB — CBC
HCT: 47.2 % (ref 39.0–52.0)
Hemoglobin: 16 g/dL (ref 13.0–17.0)
MCHC: 33.9 g/dL (ref 30.0–36.0)
MCV: 85.1 fl (ref 78.0–100.0)
Platelets: 159 10*3/uL (ref 150.0–400.0)
RBC: 5.55 Mil/uL (ref 4.22–5.81)
RDW: 14.7 % (ref 11.5–15.5)
WBC: 5.7 10*3/uL (ref 4.0–10.5)

## 2019-03-04 LAB — COMPREHENSIVE METABOLIC PANEL
ALT: 19 U/L (ref 0–53)
AST: 19 U/L (ref 0–37)
Albumin: 4.4 g/dL (ref 3.5–5.2)
Alkaline Phosphatase: 52 U/L (ref 39–117)
BUN: 19 mg/dL (ref 6–23)
CO2: 25 mEq/L (ref 19–32)
Calcium: 9.7 mg/dL (ref 8.4–10.5)
Chloride: 100 mEq/L (ref 96–112)
Creatinine, Ser: 1.36 mg/dL (ref 0.40–1.50)
GFR: 51.31 mL/min — ABNORMAL LOW (ref >60.00)
Glucose, Bld: 127 mg/dL — ABNORMAL HIGH (ref 70–99)
Potassium: 3.8 mEq/L (ref 3.5–5.1)
Sodium: 134 mEq/L — ABNORMAL LOW (ref 135–145)
Total Bilirubin: 1.3 mg/dL — ABNORMAL HIGH (ref 0.2–1.2)
Total Protein: 7.4 g/dL (ref 6.0–8.3)

## 2019-03-04 NOTE — Progress Notes (Signed)
urin

## 2019-03-04 NOTE — Telephone Encounter (Signed)
Patient's wife contacted me via her mychart and let me know that Dennis [name] has not been doing well. I called and spoke with wife, son and the patient. The patient reports that he is feeling better today. He has a lab visit for Dr. Dwyane Dee and an appointment with Dr. Amedeo Plenty for his gout in his hands. He has not been febrile, but has had chills. No URI symptoms. Some urinary frequency without dysuria. Has not eaten for last several days, taking fluids. Mostly staying in bed, in part due to back pain. Has appointment with ortho on 03/07/19. Using lidocaine patches with some improvement. Legs swelling continues to be improved, no increased weights. His family will bring in urine sample.

## 2019-03-05 LAB — T4, FREE: Free T4: 1.11 ng/dL (ref 0.60–1.60)

## 2019-03-05 LAB — TSH: TSH: 1.39 u[IU]/mL (ref 0.35–4.50)

## 2019-03-05 LAB — TESTOSTERONE: Testosterone: 155.83 ng/dL — ABNORMAL LOW (ref 300.00–890.00)

## 2019-03-06 ENCOUNTER — Encounter: Payer: Self-pay | Admitting: Family Medicine

## 2019-03-06 ENCOUNTER — Encounter (HOSPITAL_COMMUNITY): Payer: Self-pay

## 2019-03-06 ENCOUNTER — Ambulatory Visit (HOSPITAL_COMMUNITY): Admission: RE | Admit: 2019-03-06 | Payer: Medicare Other | Source: Ambulatory Visit

## 2019-03-07 ENCOUNTER — Other Ambulatory Visit (INDEPENDENT_AMBULATORY_CARE_PROVIDER_SITE_OTHER): Payer: Medicare Other

## 2019-03-07 ENCOUNTER — Other Ambulatory Visit: Payer: Self-pay

## 2019-03-07 ENCOUNTER — Ambulatory Visit (INDEPENDENT_AMBULATORY_CARE_PROVIDER_SITE_OTHER): Payer: Medicare Other | Admitting: Endocrinology

## 2019-03-07 ENCOUNTER — Encounter: Payer: Self-pay | Admitting: Endocrinology

## 2019-03-07 DIAGNOSIS — I639 Cerebral infarction, unspecified: Secondary | ICD-10-CM

## 2019-03-07 DIAGNOSIS — E230 Hypopituitarism: Secondary | ICD-10-CM | POA: Diagnosis not present

## 2019-03-07 DIAGNOSIS — R350 Frequency of micturition: Secondary | ICD-10-CM | POA: Diagnosis not present

## 2019-03-07 DIAGNOSIS — M5440 Lumbago with sciatica, unspecified side: Secondary | ICD-10-CM | POA: Diagnosis not present

## 2019-03-07 DIAGNOSIS — M5416 Radiculopathy, lumbar region: Secondary | ICD-10-CM | POA: Diagnosis not present

## 2019-03-07 LAB — POCT URINALYSIS DIPSTICK
Bilirubin, UA: NEGATIVE
Blood, UA: 6
Glucose, UA: NEGATIVE
Ketones, UA: NEGATIVE
Leukocytes, UA: NEGATIVE
Nitrite, UA: NEGATIVE
Protein, UA: POSITIVE — AB
Spec Grav, UA: >=1.03 — AB (ref 1.010–1.025)
Urobilinogen, UA: 0.2 E.U./dL
pH, UA: 6 (ref 5.0–8.0)

## 2019-03-07 MED ORDER — LEVOTHYROXINE SODIUM 137 MCG PO TABS: ORAL_TABLET | ORAL | 1 refills | Status: DC

## 2019-03-07 MED ORDER — HYDROCORTISONE 10 MG PO TABS: ORAL_TABLET | ORAL | 2 refills | Status: DC

## 2019-03-07 MED ORDER — TESTOSTERONE CYPIONATE 200 MG/ML IM SOLN: 120.0000 mg | INTRAMUSCULAR | 5 refills | Status: DC
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
## 2019-03-07 NOTE — Addendum Note (Signed)
Addended by: Virl Cagey on: 03/07/2019 01:57 PM   Modules accepted: Orders

## 2019-03-08 ENCOUNTER — Ambulatory Visit: Payer: Medicare Other | Admitting: Family Medicine

## 2019-03-08 NOTE — Telephone Encounter (Signed)
PA completed. Approved through Medicare Part D through 07/11/2019 PA # [id number]  Pt aware to contact his pharmacy to have them run this prescription again and they will be able to see the new copay adjustment.

## 2019-03-09 LAB — URINE CULTURE
MICRO NUMBER:: 819069
SPECIMEN QUALITY:: ADEQUATE

## 2019-03-11 ENCOUNTER — Other Ambulatory Visit: Payer: Self-pay | Admitting: Family Medicine

## 2019-03-11 DIAGNOSIS — G8929 Other chronic pain: Secondary | ICD-10-CM

## 2019-03-11 DIAGNOSIS — M9979 Connective tissue and disc stenosis of intervertebral foramina of abdomen and other regions: Secondary | ICD-10-CM

## 2019-03-11 DIAGNOSIS — M5442 Lumbago with sciatica, left side: Secondary | ICD-10-CM

## 2019-03-11 DIAGNOSIS — Z981 Arthrodesis status: Secondary | ICD-10-CM

## 2019-03-13 ENCOUNTER — Encounter: Payer: Self-pay | Admitting: Family Medicine

## 2019-03-14 ENCOUNTER — Other Ambulatory Visit: Payer: Self-pay | Admitting: Family Medicine

## 2019-03-14 DIAGNOSIS — M5442 Lumbago with sciatica, left side: Secondary | ICD-10-CM

## 2019-03-14 DIAGNOSIS — G8929 Other chronic pain: Secondary | ICD-10-CM

## 2019-03-14 DIAGNOSIS — M9979 Connective tissue and disc stenosis of intervertebral foramina of abdomen and other regions: Secondary | ICD-10-CM

## 2019-03-14 MED ORDER — OXYCODONE-ACETAMINOPHEN 5-325 MG PO TABS: 1.0000 | ORAL_TABLET | Freq: Four times a day (QID) | ORAL | 0 refills | Status: AC | PRN

## 2019-03-14 MED ORDER — METHOCARBAMOL 500 MG PO TABS: 500.0000 mg | ORAL_TABLET | Freq: Three times a day (TID) | ORAL | 1 refills | Status: DC | PRN

## 2019-03-14 NOTE — Progress Notes (Signed)
Patient awaiting appointment with [facility] for pain management. Five day supply of pain medication provided.

## 2019-03-14 NOTE — Progress Notes (Signed)
m °

## 2019-03-26 ENCOUNTER — Encounter: Payer: Self-pay | Admitting: Family Medicine

## 2019-03-26 ENCOUNTER — Telehealth: Payer: Self-pay | Admitting: *Deleted

## 2019-03-26 NOTE — Telephone Encounter (Signed)
    [facility] Pre-operative Risk Assessment    Request for surgical clearance:  1. What type of surgery is being performed? SPINAL CORD STIMULATOR   2. When is this surgery scheduled? TBD   3. What type of clearance is required (medical clearance vs. Pharmacy clearance to hold med vs. Both)? MEDICAL  4. Are there any medications that need to be held prior to surgery and how long? HOLD PLAVIX and ASA 7 DAYS PRIOR TO PROCEDURE and 7 DAYS DURING TRIAL WITH RESTART 24 HOURS AFTER LEAD PULL; THIS WILL BE A TOTAL OF 15 DAYS TO HOLD PLAVIX    5. Practice name and name of physician performing surgery? [facility]; LEFT MESSAGE FOR NAME OF SURGEON   6. What is your office phone number [phone]   7.   What is your office fax number [phone]  8.   Anesthesia type (None, local, MAC, general) ? LEFT MESSAGE FOR TYPE OF ANESTHESIA   Julaine Hua 03/26/2019, 1:59 PM  _________________________________________________________________   (provider comments below)

## 2019-03-27 ENCOUNTER — Other Ambulatory Visit: Payer: Self-pay

## 2019-03-27 ENCOUNTER — Encounter: Payer: Self-pay | Admitting: Internal Medicine

## 2019-03-27 ENCOUNTER — Ambulatory Visit (INDEPENDENT_AMBULATORY_CARE_PROVIDER_SITE_OTHER): Payer: Medicare Other | Admitting: Internal Medicine

## 2019-03-27 VITALS — BP 138/82 | HR 111 | Ht 77.0 in | Wt 367.0 lb

## 2019-03-27 DIAGNOSIS — I63312 Cerebral infarction due to thrombosis of left middle cerebral artery: Secondary | ICD-10-CM

## 2019-03-27 DIAGNOSIS — I639 Cerebral infarction, unspecified: Secondary | ICD-10-CM
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
## 2019-03-27 NOTE — Telephone Encounter (Signed)
Follow up       [facility] Pre-operative Risk Assessment    Request for surgical clearance:  1. What type of surgery is being performed? Spinal cord stimulator  2. When is this surgery scheduled? TBD   3. What type of clearance is required (medical clearance vs. Pharmacy clearance to hold med vs. Both)? Both  4. Are there any medications that need to be held prior to surgery and how long? Hold Plavix and ASA 7 days prior to procedures and 7 days during trial with restart 24 hours after lead pull. This will be a toral of 15 days to hold Plavix  5. Practice name and name of physician performing surgery?  [facility]: Dr. Sheliah Mends  6. What is your office phone number [phone]   7.   What is your office fax number [phone]  8.   Anesthesia type (None, local, MAC, general) ? MAC   Dennis [name] 03/27/2019, 3:18 PM  _________________________________________________________________   (provider comments below)

## 2019-03-27 NOTE — Patient Instructions (Addendum)
  Medication Instructions:  Your physician recommends that you continue on your current medications as directed. Please refer to the Current Medication list given to you today.  Labwork: None ordered.  Testing/Procedures: None ordered.  Follow-Up:  Your physician wants you to follow-up in: as needed with [name]    Implantable Loop Recorder Placement, Care After Refer to this sheet in the next few weeks. These instructions provide you with information about caring for yourself after your procedure. Your health care provider may also give you more specific instructions. Your treatment has been planned according to current medical practices, but problems sometimes occur. Call your health care provider if you have any problems or questions after your procedure. What can I expect after the procedure? After the procedure, it is common to have:  Soreness or pain near the cut from surgery (incision).  Some swelling or bruising near the incision.  Follow these instructions at home:  Medicines  Take over-the-counter and prescription medicines only as told by your health care provider.  If you were prescribed an antibiotic medicine, take it as told by your health care provider. Do not stop taking the antibiotic even if you start to feel better.  Bathing Do not take baths, swim, or use a hot tub until your health care provider approves. You may shower 24 hours after removal of your monitor.  Incision care  Follow instructions from your health care provider about how to take care of your incision. Make sure you: ? Remove your top dressing after 24 hours (before you shower) ? Leave stitches (sutures), skin glue, or adhesive strips in place. These skin closures may need to stay in place for 2 weeks or longer. If adhesive strip edges start to loosen and curl up, you may trim the loose edges. Do not remove adhesive strips completely unless your health care provider tells you to do  that.  Check your incision area every day for signs of infection. Check for: ? More redness, swelling, or pain. ? Fluid or blood. ? Warmth. ? Pus or a bad smell.  Contact a health care provider if:  You have more redness, swelling, or pain around your incision.  You have more fluid or blood coming from your incision.  Your incision feels warm to the touch.  You have pus or a bad smell coming from your incision.  You have a fever.  You have pain that is not relieved by your pain medicine.  You have triggered your device because of fainting (syncope) or because of a heartbeat that feels like it is racing, slow, fluttering, or skipping (palpitations).

## 2019-03-28 DIAGNOSIS — M961 Postlaminectomy syndrome, not elsewhere classified: Secondary | ICD-10-CM | POA: Diagnosis not present

## 2019-03-28 DIAGNOSIS — G894 Chronic pain syndrome: Secondary | ICD-10-CM | POA: Diagnosis not present

## 2019-03-28 DIAGNOSIS — M792 Neuralgia and neuritis, unspecified: Secondary | ICD-10-CM | POA: Diagnosis not present

## 2019-03-28 DIAGNOSIS — M5416 Radiculopathy, lumbar region: Secondary | ICD-10-CM | POA: Diagnosis not present

## 2019-03-28 NOTE — Telephone Encounter (Signed)
Patient with diagnosis of afib on Eliquis for anticoagulation.    Procedure: Spinal cord stimulator Date of procedure: TBD  CHADS2-VASc score of  6 (CHF, HTN, AGE, stroke/tia x 2, CAD)  CrCl 82 ml/min  Patient is high risk off anticoagulation. Patient would need to hold Elqiuis 3 days prior to procedure and although not mentioned in clearance request, I assume would need to hold for 7 days after. Due to patients history of stroke, it would not be recommended to hold anticoagulation for this long. I will route to MD for input

## 2019-03-28 NOTE — Telephone Encounter (Signed)
It is really a risk/benefit question. I am not sure what the incremental benfit of lovenox would be compared to stopping Eliquis for 3 days prior (vs 2 days of lovenox) vs being off of all anti-coagulation for as much as a week post procedure. We have no data to tell us differently. If he wants to proceed with the spinal stimulator then proceed. There is a small stroke risk. He is in NSR. GT

## 2019-03-28 NOTE — Telephone Encounter (Signed)
Pt is not on aspirin and plavix. He is on Eliquis for afib and hx of stroke. I will send request to our pharmacy for input on holding Eliquis for the procedure.

## 2019-03-29 NOTE — Telephone Encounter (Signed)
   Primary Cardiologist:Christopher Angelena Form, MD  Chart reviewed as part of pre-operative protocol coverage. Because of Dennis Kokal Kabat Sr.'s past medical history and time since last visit, he/she will require a follow-up visit in order to better assess preoperative cardiovascular risk.  Pre-op covering staff: - Please schedule appointment and call patient to inform them. - Please contact requesting surgeon's office via preferred method (i.e, phone, fax) to inform them of need for appointment prior to surgery.  If applicable, this message will also be routed to pharmacy pool and/or primary cardiologist for input on holding anticoagulant/antiplatelet agent as requested below so that this information is available at time of patient's appointment.   Kathyrn Drown, NP  03/29/2019, 9:17 AM

## 2019-03-29 NOTE — Telephone Encounter (Signed)
I will route to Robbie Lis, [facility] for appt on 04/02/19

## 2019-03-29 NOTE — Telephone Encounter (Signed)
Pt aware he needs appt for surgery clearance. Pt has been scheduled to see Robbie Lis, PA 9/22 @ 1:45 pm. Pt states he is going to need his wife to come because he has a hard time walking due to pain in his back. Pt and his wife thanked me for the call. Both the pt and his wife understand that they are to arrive 15 minutes before appt and will be asked covid questions and temp taken when they arrive and to wear their masks. Both the pt and his wife thanked me for the call and my help.

## 2019-04-02 ENCOUNTER — Other Ambulatory Visit: Payer: Self-pay

## 2019-04-02 ENCOUNTER — Ambulatory Visit (INDEPENDENT_AMBULATORY_CARE_PROVIDER_SITE_OTHER): Payer: Medicare Other | Admitting: Physician Assistant

## 2019-04-02 ENCOUNTER — Encounter: Payer: Self-pay | Admitting: Physician Assistant

## 2019-04-02 VITALS — BP 120/80 | HR 87 | Ht 77.0 in | Wt 379.0 lb

## 2019-04-02 DIAGNOSIS — I480 Paroxysmal atrial fibrillation: Secondary | ICD-10-CM

## 2019-04-02 DIAGNOSIS — I639 Cerebral infarction, unspecified: Secondary | ICD-10-CM | POA: Diagnosis not present

## 2019-04-02 DIAGNOSIS — I2510 Atherosclerotic heart disease of native coronary artery without angina pectoris: Secondary | ICD-10-CM | POA: Diagnosis not present

## 2019-04-02 DIAGNOSIS — Z01810 Encounter for preprocedural cardiovascular examination: Secondary | ICD-10-CM

## 2019-04-02 DIAGNOSIS — I5032 Chronic diastolic (congestive) heart failure: Secondary | ICD-10-CM | POA: Diagnosis not present
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
## 2019-04-02 NOTE — Patient Instructions (Signed)
Medication Instructions:  Your physician recommends that you continue on your current medications as directed. Please refer to the Current Medication list given to you today.  If you need a refill on your cardiac medications before your next appointment, please call your pharmacy.   Lab work: NONE ORDERED  TODAY   If you have labs (blood work) drawn today and your tests are completely normal, you will receive your results only by: [place] Kitchen MyChart Message (if you have MyChart) OR . A paper copy in the mail If you have any lab test that is abnormal or we need to change your treatment, we will call you to review the results.  Testing/Procedures: NONE ORDERED  TODAY  Follow-Up: At [facility], you and your health needs are our priority.  As part of our continuing mission to provide you with exceptional heart care, we have created designated Provider Care Teams.  These Care Teams include your primary Cardiologist (physician) and Advanced Practice Providers (APPs -  Physician Assistants and Nurse Practitioners) who all work together to provide you with the care you need, when you need it. You will need a follow up appointment in:  6 months.  Please call our office 2 months in advance to schedule this appointment.  You may see Lauree Chandler, MD or one of the following Advanced Practice Providers on your designated Care Team: Richardson Dopp, PA-C [place], [place] . Daune Perch, NP  Any Other Special Instructions Will Be Listed Below (If Applicable).

## 2019-04-09 DIAGNOSIS — R310 Gross hematuria: Secondary | ICD-10-CM | POA: Diagnosis not present

## 2019-04-10 ENCOUNTER — Ambulatory Visit: Payer: Medicare Other | Admitting: Family Medicine

## 2019-04-11 ENCOUNTER — Other Ambulatory Visit: Payer: Self-pay | Admitting: Family Medicine

## 2019-04-11 ENCOUNTER — Encounter: Payer: Self-pay | Admitting: Family Medicine

## 2019-04-11 DIAGNOSIS — L03119 Cellulitis of unspecified part of limb: Secondary | ICD-10-CM

## 2019-04-11 MED ORDER — SULFAMETHOXAZOLE-TRIMETHOPRIM 800-160 MG PO TABS: 1.0000 | ORAL_TABLET | Freq: Two times a day (BID) | ORAL | 0 refills | Status: AC

## 2019-04-12 ENCOUNTER — Encounter: Payer: Self-pay | Admitting: Family Medicine

## 2019-04-12 ENCOUNTER — Other Ambulatory Visit: Payer: Self-pay

## 2019-04-12 ENCOUNTER — Ambulatory Visit (INDEPENDENT_AMBULATORY_CARE_PROVIDER_SITE_OTHER): Payer: Medicare Other | Admitting: Family Medicine

## 2019-04-12 VITALS — BP 130/76 | HR 115 | Temp 98.7°F | Ht 77.0 in | Wt 374.0 lb

## 2019-04-12 DIAGNOSIS — E291 Testicular hypofunction: Secondary | ICD-10-CM | POA: Diagnosis not present

## 2019-04-12 DIAGNOSIS — G8929 Other chronic pain: Secondary | ICD-10-CM | POA: Diagnosis not present

## 2019-04-12 DIAGNOSIS — I10 Essential (primary) hypertension: Secondary | ICD-10-CM | POA: Diagnosis not present

## 2019-04-12 DIAGNOSIS — M7989 Other specified soft tissue disorders: Secondary | ICD-10-CM

## 2019-04-12 DIAGNOSIS — M5442 Lumbago with sciatica, left side: Secondary | ICD-10-CM

## 2019-04-12 DIAGNOSIS — E559 Vitamin D deficiency, unspecified: Secondary | ICD-10-CM | POA: Diagnosis not present

## 2019-04-12 DIAGNOSIS — L0390 Cellulitis, unspecified: Secondary | ICD-10-CM | POA: Diagnosis not present

## 2019-04-12 DIAGNOSIS — E7800 Pure hypercholesterolemia, unspecified: Secondary | ICD-10-CM | POA: Diagnosis not present

## 2019-04-12 DIAGNOSIS — I639 Cerebral infarction, unspecified: Secondary | ICD-10-CM

## 2019-04-12 DIAGNOSIS — R739 Hyperglycemia, unspecified: Secondary | ICD-10-CM | POA: Diagnosis not present

## 2019-04-12 DIAGNOSIS — M5441 Lumbago with sciatica, right side: Secondary | ICD-10-CM | POA: Diagnosis not present
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
## 2019-04-12 NOTE — Patient Instructions (Signed)
Increase lasix to 1 and 1/2 tablets daily x 3 days for swelling  I will notify you of compression stocking information  Increase mobility- move every hour during the day.    Schedule early morning lab visit for testosterone and other labs  Good luck with procedure, let me know if you need neuro PT orders  Use Zeasorb on feet- can try SSD for a week first, apply once a day

## 2019-04-13 ENCOUNTER — Encounter: Payer: Self-pay | Admitting: Family Medicine

## 2019-04-13 DIAGNOSIS — M5441 Lumbago with sciatica, right side: Secondary | ICD-10-CM | POA: Insufficient documentation

## 2019-04-13 DIAGNOSIS — E291 Testicular hypofunction: Secondary | ICD-10-CM | POA: Insufficient documentation

## 2019-04-13 DIAGNOSIS — G8929 Other chronic pain: Secondary | ICD-10-CM | POA: Insufficient documentation

## 2019-04-15 ENCOUNTER — Ambulatory Visit: Payer: Medicare Other | Admitting: Family Medicine

## 2019-04-15 DIAGNOSIS — N3021 Other chronic cystitis with hematuria: Secondary | ICD-10-CM | POA: Diagnosis not present

## 2019-04-15 DIAGNOSIS — N401 Enlarged prostate with lower urinary tract symptoms: Secondary | ICD-10-CM | POA: Diagnosis not present

## 2019-04-15 DIAGNOSIS — R350 Frequency of micturition: Secondary | ICD-10-CM | POA: Diagnosis not present

## 2019-04-15 NOTE — Addendum Note (Signed)
Addended by: Ellamae Sia on: 04/15/2019 10:18 AM   Modules accepted: Orders

## 2019-04-19 ENCOUNTER — Encounter: Payer: Self-pay | Admitting: Family Medicine

## 2019-04-19 DIAGNOSIS — M961 Postlaminectomy syndrome, not elsewhere classified: Secondary | ICD-10-CM | POA: Insufficient documentation

## 2019-04-22 ENCOUNTER — Telehealth: Payer: Self-pay | Admitting: Adult Health Nurse Practitioner

## 2019-04-22 NOTE — Telephone Encounter (Signed)
Reached out to patient to schedule follow up appointment.  Scheduled telephone appointment for 05/13/2019 at 11:15 AM Shelah Heatley K. Olena Heckle NP

## 2019-04-25 ENCOUNTER — Encounter: Payer: Self-pay | Admitting: Family Medicine

## 2019-04-29 DIAGNOSIS — M961 Postlaminectomy syndrome, not elsewhere classified: Secondary | ICD-10-CM | POA: Diagnosis not present

## 2019-04-30 ENCOUNTER — Encounter: Payer: Self-pay | Admitting: Family Medicine

## 2019-05-13 ENCOUNTER — Other Ambulatory Visit: Payer: Medicare Other | Admitting: Adult Health Nurse Practitioner

## 2019-05-13 ENCOUNTER — Telehealth: Payer: Self-pay | Admitting: Adult Health Nurse Practitioner

## 2019-05-13 ENCOUNTER — Other Ambulatory Visit: Payer: Self-pay

## 2019-05-13 NOTE — Telephone Encounter (Signed)
Patient returned call.  Stated he is having surgery soon to put pain stimulator in back and that he was getting COVID tested today prior to the surgery.  Wants to call provider back sometime after the surgery to schedule an appointment. Pink Maye K. Olena Heckle NP

## 2019-05-14 ENCOUNTER — Encounter: Payer: Self-pay | Admitting: Family Medicine

## 2019-05-20 DIAGNOSIS — M961 Postlaminectomy syndrome, not elsewhere classified: Secondary | ICD-10-CM | POA: Diagnosis not present

## 2019-05-21 ENCOUNTER — Encounter: Payer: Self-pay | Admitting: Family Medicine

## 2019-05-21 NOTE — Telephone Encounter (Signed)
Will send to [facility] as Juluis Rainier

## 2019-06-03 ENCOUNTER — Encounter: Payer: Self-pay | Admitting: Family Medicine

## 2019-06-03 ENCOUNTER — Other Ambulatory Visit: Payer: Self-pay | Admitting: Family Medicine

## 2019-06-03 ENCOUNTER — Other Ambulatory Visit: Payer: Self-pay

## 2019-06-03 DIAGNOSIS — R8290 Unspecified abnormal findings in urine: Secondary | ICD-10-CM

## 2019-06-03 DIAGNOSIS — R32 Unspecified urinary incontinence: Secondary | ICD-10-CM

## 2019-06-03 MED ORDER — CARBIDOPA-LEVODOPA 25-250 MG PO TABS: 2.0000 | ORAL_TABLET | Freq: Four times a day (QID) | ORAL | 1 refills | Status: DC

## 2019-06-03 NOTE — Telephone Encounter (Signed)
Orders placed for send out ua/culture.

## 2019-06-03 NOTE — Telephone Encounter (Signed)
Spoke with [name] - she states that onset of symptoms was noticed because of incontinence. Urine is very cloudy and very yellow.  No fever, no blood in urine or worsening leg edema  Pt is having general weakness - not worse than normal.  Back pain is the same which is actually improving with the implant -- hard to tell his any back pain from UTI/bladder infection.  She is going to either drop off a urine tomorrow around 9am OR bring him in for a urine sample.

## 2019-06-03 NOTE — Telephone Encounter (Signed)
Noted  

## 2019-06-03 NOTE — Progress Notes (Signed)
Orders placed for urine micro/culture due to new incontinence and abnormal urine odor.

## 2019-06-03 NOTE — Telephone Encounter (Signed)
Name of Medication:carbidopalevodopa 25-250 mg  Name of Pharmacy: [facility] Last Fill or Written Date and Quantity: #720 on 03/06/19 Last Office Visit and Type: 04/12/2019 FU Next Office Visit and Type: 09/13/2019 annual visit   Pt said pharmacy has been requesting refill of carbidopa-levodopa for 1 wk. I advised pt that I cannot find the refill request in pts chart and I am requesting refill now. Pt said he is not out of med but [place] and request sent to [facility].Please advise.

## 2019-06-03 NOTE — Telephone Encounter (Signed)
Spoke with Pt [name], she states that she sent in a message this morning to [place] but I did not see one on his chart.  She advised that she sent it under HER chart not his. I advised that its best to send messages regarding Dennis [name] under his mychart not hers so that it is traceable. I went into her Mychart account and saw that she has mistakenly sent the message to Dr Ubaldo Glassing who responded to her telling her to contact [name] .   She requested that I send this to [name] as FYI but to let her know that Done possibly has another UTI - became incontinent over the weekend - he had an old prescription on hand that he started taking ( she does not recall the name at this time, [place] ) and it seems to be helping. She states that she will look when she gets home and will send another message.  She also stated that he is following up with this other Provider this week regarding his spinal implant.   Good morning dear heart  I know you are not working but we need some directive here with Dennis [name].  I think Dennis [name] has another UTI.  He cannot hold his kidneys to make it to the bathroom and I have him back in male  diapers.  It's gotten worse in the last twisty days and yesterday was horrible.  He had an antibiotic here and  now he's taking it last night.  He is drinking plenty of fluids but I thing the recent surgeries and  just overall stress might have brought this on.  I can bring in a specimen Monday.  Thoughts??  Does he need something else RX wise ? Thank you always.

## 2019-06-03 NOTE — Telephone Encounter (Signed)
Please call patient- I can not send a reply through mychart since it did not come from me. Can she bring a urine tomorrow or have him come for a lab only visit? Anything else unusual? Back pain, blood in urine, fever, pain, leg weakness or swelling?

## 2019-06-04 ENCOUNTER — Other Ambulatory Visit (INDEPENDENT_AMBULATORY_CARE_PROVIDER_SITE_OTHER): Payer: Medicare Other

## 2019-06-04 ENCOUNTER — Encounter: Payer: Self-pay | Admitting: Family Medicine

## 2019-06-04 DIAGNOSIS — R32 Unspecified urinary incontinence: Secondary | ICD-10-CM

## 2019-06-04 DIAGNOSIS — R8290 Unspecified abnormal findings in urine: Secondary | ICD-10-CM | POA: Diagnosis not present

## 2019-06-04 DIAGNOSIS — M961 Postlaminectomy syndrome, not elsewhere classified: Secondary | ICD-10-CM | POA: Diagnosis not present

## 2019-06-04 DIAGNOSIS — Z9689 Presence of other specified functional implants: Secondary | ICD-10-CM | POA: Diagnosis not present

## 2019-06-04 LAB — POCT URINALYSIS DIPSTICK
Bilirubin, UA: NEGATIVE
Blood, UA: NEGATIVE
Glucose, UA: NEGATIVE
Ketones, UA: NEGATIVE
Leukocytes, UA: NEGATIVE
Nitrite, UA: NEGATIVE
Protein, UA: NEGATIVE
Spec Grav, UA: >=1.03 — AB (ref 1.010–1.025)
Urobilinogen, UA: 0.2 E.U./dL
pH, UA: 6 (ref 5.0–8.0)

## 2019-06-04 NOTE — Telephone Encounter (Signed)
Pt dropped off urine and culture collected.   Nothing further needed.

## 2019-06-05 ENCOUNTER — Encounter: Payer: Self-pay | Admitting: Family Medicine

## 2019-06-05 LAB — URINALYSIS, ROUTINE W REFLEX MICROSCOPIC
Bilirubin Urine: NEGATIVE
Hgb urine dipstick: NEGATIVE
Ketones, ur: NEGATIVE
Nitrite: NEGATIVE
RBC / HPF: NONE SEEN (ref 0–?)
Specific Gravity, Urine: 1.025 (ref 1.000–1.030)
Total Protein, Urine: NEGATIVE
Urine Glucose: NEGATIVE
Urobilinogen, UA: 0.2 (ref 0.0–1.0)
pH: 5.5 (ref 5.0–8.0)

## 2019-06-06 LAB — URINE CULTURE
MICRO NUMBER:: 1134705
SPECIMEN QUALITY:: ADEQUATE

## 2019-06-11 ENCOUNTER — Telehealth: Payer: Self-pay

## 2019-06-11 NOTE — Telephone Encounter (Signed)
Pt returned call, states that he has "device" placed in his back and would like to wait to schedule colonoscopy. Pt advised that he needs to have colonoscopy. Pt states that he would like to give [name] a call back later to schedule office visit to discuss colonoscopy.

## 2019-06-11 NOTE — Telephone Encounter (Signed)
Called pt- had to leave message for pt. Need to get pt scheduled for office visit to discuss scheduling colonoscopy with Dr Rush Landmark.

## 2019-06-11 NOTE — Telephone Encounter (Signed)
Pt stated that he just had surgery to have "a medical device placed in back for chronic pain."  Pt will call back in January to schedule OV.  FYI.

## 2019-06-12 DIAGNOSIS — R609 Edema, unspecified: Secondary | ICD-10-CM | POA: Diagnosis not present

## 2019-06-12 DIAGNOSIS — M79604 Pain in right leg: Secondary | ICD-10-CM | POA: Diagnosis not present

## 2019-06-12 DIAGNOSIS — M1711 Unilateral primary osteoarthritis, right knee: Secondary | ICD-10-CM | POA: Diagnosis not present

## 2019-06-12 DIAGNOSIS — R600 Localized edema: Secondary | ICD-10-CM | POA: Diagnosis not present

## 2019-06-21 ENCOUNTER — Inpatient Hospital Stay (HOSPITAL_COMMUNITY): Payer: Medicare Other | Admitting: Anesthesiology

## 2019-06-21 ENCOUNTER — Other Ambulatory Visit: Payer: Self-pay | Admitting: Ophthalmology

## 2019-06-21 ENCOUNTER — Encounter (HOSPITAL_COMMUNITY): Admission: AD | Disposition: A | Discharge status: Home / Self Care | Payer: Self-pay | Attending: Ophthalmology

## 2019-06-21 ENCOUNTER — Encounter: Payer: Self-pay | Admitting: Family Medicine

## 2019-06-21 ENCOUNTER — Ambulatory Visit (HOSPITAL_COMMUNITY)
Admission: AD | Admit: 2019-06-21 | Discharge: 2019-06-21 | Disposition: A | Discharge status: Home / Self Care | Payer: Medicare Other | Source: Other Acute Inpatient Hospital | Attending: Ophthalmology | Admitting: Ophthalmology

## 2019-06-21 ENCOUNTER — Other Ambulatory Visit: Payer: Self-pay

## 2019-06-21 ENCOUNTER — Encounter (HOSPITAL_COMMUNITY): Payer: Self-pay | Admitting: Ophthalmology

## 2019-06-21 DIAGNOSIS — H43811 Vitreous degeneration, right eye: Secondary | ICD-10-CM | POA: Diagnosis not present

## 2019-06-21 DIAGNOSIS — H33002 Unspecified retinal detachment with retinal break, left eye: Secondary | ICD-10-CM

## 2019-06-21 DIAGNOSIS — M109 Gout, unspecified: Secondary | ICD-10-CM | POA: Diagnosis not present

## 2019-06-21 DIAGNOSIS — H33012 Retinal detachment with single break, left eye: Secondary | ICD-10-CM | POA: Diagnosis not present

## 2019-06-21 DIAGNOSIS — G20 Parkinson's disease: Secondary | ICD-10-CM | POA: Insufficient documentation

## 2019-06-21 DIAGNOSIS — Z8547 Personal history of malignant neoplasm of testis: Secondary | ICD-10-CM | POA: Insufficient documentation

## 2019-06-21 DIAGNOSIS — Z79899 Other long term (current) drug therapy: Secondary | ICD-10-CM | POA: Diagnosis not present

## 2019-06-21 DIAGNOSIS — M479 Spondylosis, unspecified: Secondary | ICD-10-CM | POA: Diagnosis not present

## 2019-06-21 DIAGNOSIS — I5032 Chronic diastolic (congestive) heart failure: Secondary | ICD-10-CM | POA: Diagnosis not present

## 2019-06-21 DIAGNOSIS — Z87442 Personal history of urinary calculi: Secondary | ICD-10-CM | POA: Diagnosis not present

## 2019-06-21 DIAGNOSIS — E785 Hyperlipidemia, unspecified: Secondary | ICD-10-CM | POA: Insufficient documentation

## 2019-06-21 DIAGNOSIS — H31092 Other chorioretinal scars, left eye: Secondary | ICD-10-CM | POA: Diagnosis not present

## 2019-06-21 DIAGNOSIS — F329 Major depressive disorder, single episode, unspecified: Secondary | ICD-10-CM | POA: Insufficient documentation

## 2019-06-21 DIAGNOSIS — Z79890 Hormone replacement therapy (postmenopausal): Secondary | ICD-10-CM | POA: Diagnosis not present

## 2019-06-21 DIAGNOSIS — Z981 Arthrodesis status: Secondary | ICD-10-CM | POA: Diagnosis not present

## 2019-06-21 DIAGNOSIS — E271 Primary adrenocortical insufficiency: Secondary | ICD-10-CM | POA: Diagnosis not present

## 2019-06-21 DIAGNOSIS — H35372 Puckering of macula, left eye: Secondary | ICD-10-CM | POA: Diagnosis not present

## 2019-06-21 DIAGNOSIS — I252 Old myocardial infarction: Secondary | ICD-10-CM | POA: Insufficient documentation

## 2019-06-21 DIAGNOSIS — E039 Hypothyroidism, unspecified: Secondary | ICD-10-CM | POA: Diagnosis not present

## 2019-06-21 DIAGNOSIS — G4733 Obstructive sleep apnea (adult) (pediatric): Secondary | ICD-10-CM | POA: Diagnosis not present

## 2019-06-21 DIAGNOSIS — H3322 Serous retinal detachment, left eye: Secondary | ICD-10-CM | POA: Diagnosis not present

## 2019-06-21 DIAGNOSIS — Z20828 Contact with and (suspected) exposure to other viral communicable diseases: Secondary | ICD-10-CM | POA: Insufficient documentation

## 2019-06-21 DIAGNOSIS — Z7901 Long term (current) use of anticoagulants: Secondary | ICD-10-CM | POA: Insufficient documentation

## 2019-06-21 DIAGNOSIS — Z6841 Body Mass Index (BMI) 40.0 and over, adult: Secondary | ICD-10-CM | POA: Diagnosis not present

## 2019-06-21 DIAGNOSIS — I2510 Atherosclerotic heart disease of native coronary artery without angina pectoris: Secondary | ICD-10-CM | POA: Diagnosis not present

## 2019-06-21 DIAGNOSIS — I110 Hypertensive heart disease with heart failure: Secondary | ICD-10-CM | POA: Insufficient documentation

## 2019-06-21 HISTORY — PX: GAS INSERTION: SHX5336

## 2019-06-21 HISTORY — PX: MEMBRANE PEEL: SHX5967

## 2019-06-21 HISTORY — PX: GAS/FLUID EXCHANGE: SHX5334

## 2019-06-21 HISTORY — PX: PARS PLANA VITRECTOMY: SHX2166

## 2019-06-21 HISTORY — PX: PHOTOCOAGULATION WITH LASER: SHX6027

## 2019-06-21 LAB — RESPIRATORY PANEL BY RT PCR (FLU A&B, COVID)
Influenza A by PCR: NEGATIVE
Influenza B by PCR: NEGATIVE
SARS Coronavirus 2 by RT PCR: NEGATIVE

## 2019-06-21 LAB — CBC
HCT: 50.2 % (ref 39.0–52.0)
Hemoglobin: 16.1 g/dL (ref 13.0–17.0)
MCH: 27.9 pg (ref 26.0–34.0)
MCHC: 32.1 g/dL (ref 30.0–36.0)
MCV: 86.9 fL (ref 80.0–100.0)
Platelets: 170 10*3/uL (ref 150–400)
RBC: 5.78 MIL/uL (ref 4.22–5.81)
RDW: 13.7 % (ref 11.5–15.5)
WBC: 6 10*3/uL (ref 4.0–10.5)
nRBC: 0 % (ref 0.0–0.2)

## 2019-06-21 LAB — COMPREHENSIVE METABOLIC PANEL
ALT: 16 U/L (ref 0–44)
AST: 15 U/L (ref 15–41)
Albumin: 4 g/dL (ref 3.5–5.0)
Alkaline Phosphatase: 69 U/L (ref 38–126)
Anion gap: 7 (ref 5–15)
BUN: 15 mg/dL (ref 8–23)
CO2: 29 mmol/L (ref 22–32)
Calcium: 9.2 mg/dL (ref 8.9–10.3)
Chloride: 102 mmol/L (ref 98–111)
Creatinine, Ser: 1.29 mg/dL — ABNORMAL HIGH (ref 0.61–1.24)
GFR calc Af Amer: >60 mL/min (ref >60)
GFR calc non Af Amer: 55 mL/min — ABNORMAL LOW (ref >60)
Glucose, Bld: 94 mg/dL (ref 70–99)
Potassium: 4.1 mmol/L (ref 3.5–5.1)
Sodium: 138 mmol/L (ref 135–145)
Total Bilirubin: 1.6 mg/dL — ABNORMAL HIGH (ref 0.3–1.2)
Total Protein: 7.1 g/dL (ref 6.5–8.1)

## 2019-06-21 LAB — PROTIME-INR
INR: 1 (ref 0.8–1.2)
Prothrombin Time: 13.1 seconds (ref 11.4–15.2)

## 2019-06-21 SURGERY — PARS PLANA VITRECTOMY WITH 25 GAUGE
Anesthesia: General | Site: Eye | Laterality: Left

## 2019-06-21 MED ORDER — ONDANSETRON HCL 4 MG/2ML IJ SOLN
INTRAMUSCULAR | Status: DC | PRN
  Administered 2019-06-21: 4 mg via INTRAVENOUS

## 2019-06-21 MED ORDER — ROCURONIUM BROMIDE 10 MG/ML (PF) SYRINGE
PREFILLED_SYRINGE | INTRAVENOUS | Status: AC
  Filled 2019-06-21: qty 10

## 2019-06-21 MED ORDER — DEXAMETHASONE SODIUM PHOSPHATE 10 MG/ML IJ SOLN
INTRAMUSCULAR | Status: AC
  Filled 2019-06-21: qty 2

## 2019-06-21 MED ORDER — BSS IO SOLN
INTRAOCULAR | Status: AC
  Filled 2019-06-21: qty 15

## 2019-06-21 MED ORDER — BSS PLUS IO SOLN
INTRAOCULAR | Status: AC
  Filled 2019-06-21: qty 500

## 2019-06-21 MED ORDER — ONDANSETRON HCL 4 MG/2ML IJ SOLN
INTRAMUSCULAR | Status: AC
  Filled 2019-06-21: qty 2

## 2019-06-21 MED ORDER — DEXAMETHASONE SODIUM PHOSPHATE 10 MG/ML IJ SOLN
INTRAMUSCULAR | Status: AC
  Filled 2019-06-21: qty 1

## 2019-06-21 MED ORDER — EPHEDRINE SULFATE-NACL 50-0.9 MG/10ML-% IV SOSY
PREFILLED_SYRINGE | INTRAVENOUS | Status: DC | PRN
  Administered 2019-06-21: 5 mg via INTRAVENOUS
  Administered 2019-06-21 (×2): 10 mg via INTRAVENOUS
  Administered 2019-06-21: 5 mg via INTRAVENOUS

## 2019-06-21 MED ORDER — DEXAMETHASONE SODIUM PHOSPHATE 10 MG/ML IJ SOLN
INTRAMUSCULAR | Status: DC | PRN
  Administered 2019-06-21: 10 mg

## 2019-06-21 MED ORDER — MIDAZOLAM HCL 5 MG/5ML IJ SOLN
INTRAMUSCULAR | Status: DC | PRN
  Administered 2019-06-21: 2 mg via INTRAVENOUS

## 2019-06-21 MED ORDER — PROPOFOL 10 MG/ML IV BOLUS
INTRAVENOUS | Status: AC
  Filled 2019-06-21: qty 20

## 2019-06-21 MED ORDER — FENTANYL CITRATE (PF) 100 MCG/2ML IJ SOLN: 25.0000 ug | INTRAMUSCULAR | Status: DC | PRN

## 2019-06-21 MED ORDER — LIDOCAINE HCL 2 % IJ SOLN
INTRAMUSCULAR | Status: DC | PRN
  Administered 2019-06-21: 6 mL via RETROBULBAR

## 2019-06-21 MED ORDER — ATROPINE SULFATE 1 % OP SOLN
OPHTHALMIC | Status: AC
  Administered 2019-06-21: 18:00:00 1 [drp] via OPHTHALMIC
  Filled 2019-06-21: qty 5

## 2019-06-21 MED ORDER — BSS PLUS IO SOLN
INTRAOCULAR | Status: DC | PRN
  Administered 2019-06-21: 2 via INTRAOCULAR

## 2019-06-21 MED ORDER — HYPROMELLOSE (GONIOSCOPIC) 2.5 % OP SOLN
OPHTHALMIC | Status: DC | PRN
  Administered 2019-06-21: 1 [drp] via OPHTHALMIC

## 2019-06-21 MED ORDER — PROPOFOL 10 MG/ML IV BOLUS
INTRAVENOUS | Status: DC | PRN
  Administered 2019-06-21: 200 mg via INTRAVENOUS

## 2019-06-21 MED ORDER — LIDOCAINE HCL 2 % IJ SOLN
INTRAMUSCULAR | Status: AC
  Filled 2019-06-21: qty 20

## 2019-06-21 MED ORDER — ATROPINE SULFATE 1 % OP SOLN
OPHTHALMIC | Status: DC | PRN
  Administered 2019-06-21: 1 [drp] via OPHTHALMIC

## 2019-06-21 MED ORDER — DEXAMETHASONE SODIUM PHOSPHATE 10 MG/ML IJ SOLN
INTRAMUSCULAR | Status: DC | PRN
  Administered 2019-06-21: 10 mg via INTRAVENOUS

## 2019-06-21 MED ORDER — PHENYLEPHRINE HCL-NACL 10-0.9 MG/250ML-% IV SOLN
INTRAVENOUS | Status: DC | PRN
  Administered 2019-06-21: 20 ug/min via INTRAVENOUS

## 2019-06-21 MED ORDER — PHENYLEPHRINE 40 MCG/ML (10ML) SYRINGE FOR IV PUSH (FOR BLOOD PRESSURE SUPPORT)
PREFILLED_SYRINGE | INTRAVENOUS | Status: DC | PRN
  Administered 2019-06-21 (×2): 120 ug via INTRAVENOUS
  Administered 2019-06-21 (×2): 80 ug via INTRAVENOUS

## 2019-06-21 MED ORDER — BUPIVACAINE HCL (PF) 0.75 % IJ SOLN
INTRAMUSCULAR | Status: AC
  Filled 2019-06-21: qty 10

## 2019-06-21 MED ORDER — TETRACAINE HCL 0.5 % OP SOLN
OPHTHALMIC | Status: AC
  Filled 2019-06-21: qty 4

## 2019-06-21 MED ORDER — OXYCODONE HCL 5 MG/5ML PO SOLN: 5.0000 mg | Freq: Once | ORAL | Status: DC | PRN

## 2019-06-21 MED ORDER — EPHEDRINE 5 MG/ML INJ
INTRAVENOUS | Status: AC
  Filled 2019-06-21: qty 10

## 2019-06-21 MED ORDER — FENTANYL CITRATE (PF) 250 MCG/5ML IJ SOLN
INTRAMUSCULAR | Status: AC
  Filled 2019-06-21: qty 5

## 2019-06-21 MED ORDER — LIDOCAINE 2% (20 MG/ML) 5 ML SYRINGE
INTRAMUSCULAR | Status: DC | PRN
  Administered 2019-06-21: 80 mg via INTRAVENOUS

## 2019-06-21 MED ORDER — EPINEPHRINE PF 1 MG/ML IJ SOLN
INTRAMUSCULAR | Status: AC
  Filled 2019-06-21: qty 1

## 2019-06-21 MED ORDER — PHENYLEPHRINE HCL 2.5 % OP SOLN
1.0000 [drp] | OPHTHALMIC | Status: DC | PRN
  Administered 2019-06-21 (×2): 1 [drp] via OPHTHALMIC

## 2019-06-21 MED ORDER — TOBRAMYCIN-DEXAMETHASONE 0.3-0.1 % OP OINT
TOPICAL_OINTMENT | OPHTHALMIC | Status: AC
  Filled 2019-06-21: qty 3.5

## 2019-06-21 MED ORDER — LIDOCAINE-EPINEPHRINE 2 %-1:100000 IJ SOLN
INTRAMUSCULAR | Status: AC
  Filled 2019-06-21: qty 1

## 2019-06-21 MED ORDER — ONDANSETRON HCL 4 MG/2ML IJ SOLN: 4.0000 mg | Freq: Once | INTRAMUSCULAR | Status: DC | PRN

## 2019-06-21 MED ORDER — MIDAZOLAM HCL 2 MG/2ML IJ SOLN
INTRAMUSCULAR | Status: AC
  Filled 2019-06-21: qty 2

## 2019-06-21 MED ORDER — SUCCINYLCHOLINE CHLORIDE 200 MG/10ML IV SOSY
PREFILLED_SYRINGE | INTRAVENOUS | Status: DC | PRN
  Administered 2019-06-21: 160 mg via INTRAVENOUS

## 2019-06-21 MED ORDER — PHENYLEPHRINE 40 MCG/ML (10ML) SYRINGE FOR IV PUSH (FOR BLOOD PRESSURE SUPPORT)
PREFILLED_SYRINGE | INTRAVENOUS | Status: AC
  Filled 2019-06-21: qty 10

## 2019-06-21 MED ORDER — EPHEDRINE SULFATE 50 MG/ML IJ SOLN
INTRAMUSCULAR | Status: DC | PRN
  Administered 2019-06-21 (×2): 5 mg via INTRAVENOUS

## 2019-06-21 MED ORDER — ALBUMIN HUMAN 5 % IV SOLN
INTRAVENOUS | Status: DC | PRN
  Administered 2019-06-21: 19:00:00 via INTRAVENOUS

## 2019-06-21 MED ORDER — ATROPINE SULFATE 1 % OP SOLN
OPHTHALMIC | Status: AC
  Filled 2019-06-21: qty 5

## 2019-06-21 MED ORDER — EPINEPHRINE PF 1 MG/ML IJ SOLN
INTRAOCULAR | Status: DC | PRN
  Administered 2019-06-21: 500 mL

## 2019-06-21 MED ORDER — PHENYLEPHRINE HCL 2.5 % OP SOLN
OPHTHALMIC | Status: AC
  Administered 2019-06-21: 18:00:00 1 [drp] via OPHTHALMIC
  Filled 2019-06-21: qty 2

## 2019-06-21 MED ORDER — SUCCINYLCHOLINE CHLORIDE 200 MG/10ML IV SOSY
PREFILLED_SYRINGE | INTRAVENOUS | Status: AC
  Filled 2019-06-21: qty 10

## 2019-06-21 MED ORDER — SODIUM CHLORIDE 0.9 % IV SOLN
INTRAVENOUS | Status: DC
  Administered 2019-06-21 (×2): via INTRAVENOUS

## 2019-06-21 MED ORDER — LIDOCAINE 2% (20 MG/ML) 5 ML SYRINGE
INTRAMUSCULAR | Status: AC
  Filled 2019-06-21: qty 5

## 2019-06-21 MED ORDER — SUGAMMADEX SODIUM 200 MG/2ML IV SOLN
INTRAVENOUS | Status: DC | PRN
  Administered 2019-06-21: 350 mg via INTRAVENOUS

## 2019-06-21 MED ORDER — CEFAZOLIN SUBCONJUNCTIVAL INJECTION 100 MG/0.5 ML
INJECTION | SUBCONJUNCTIVAL | Status: DC | PRN
  Administered 2019-06-21: 100 mg via SUBCONJUNCTIVAL

## 2019-06-21 MED ORDER — HYALURONIDASE HUMAN 150 UNIT/ML IJ SOLN
INTRAMUSCULAR | Status: AC
  Filled 2019-06-21: qty 1

## 2019-06-21 MED ORDER — ROCURONIUM BROMIDE 50 MG/5ML IV SOSY
PREFILLED_SYRINGE | INTRAVENOUS | Status: DC | PRN
  Administered 2019-06-21: 50 mg via INTRAVENOUS

## 2019-06-21 MED ORDER — ATROPINE SULFATE 1 % OP SOLN
1.0000 [drp] | OPHTHALMIC | Status: DC | PRN
  Administered 2019-06-21 (×2): 1 [drp] via OPHTHALMIC

## 2019-06-21 MED ORDER — CEFAZOLIN SUBCONJUNCTIVAL INJECTION 100 MG/0.5 ML
100.0000 mg | INJECTION | SUBCONJUNCTIVAL | Status: DC
  Filled 2019-06-21: qty 5

## 2019-06-21 MED ORDER — FENTANYL CITRATE (PF) 250 MCG/5ML IJ SOLN
INTRAMUSCULAR | Status: DC | PRN
  Administered 2019-06-21: 50 ug via INTRAVENOUS
  Administered 2019-06-21 (×2): 100 ug via INTRAVENOUS

## 2019-06-21 MED ORDER — HYPROMELLOSE (GONIOSCOPIC) 2.5 % OP SOLN
OPHTHALMIC | Status: AC
  Filled 2019-06-21: qty 15

## 2019-06-21 MED ORDER — TOBRAMYCIN-DEXAMETHASONE 0.3-0.1 % OP OINT
TOPICAL_OINTMENT | OPHTHALMIC | Status: DC | PRN
  Administered 2019-06-21: 1 via OPHTHALMIC

## 2019-06-21 MED ORDER — OXYCODONE HCL 5 MG PO TABS: 5.0000 mg | ORAL_TABLET | Freq: Once | ORAL | Status: DC | PRN

## 2019-06-21 SURGICAL SUPPLY — 67 items
ACCESSORY FRAGMATOME (MISCELLANEOUS) IMPLANT
APPLICATOR DR MATTHEWS STRL (MISCELLANEOUS) IMPLANT
BAND WRIST GAS GREEN (MISCELLANEOUS) IMPLANT
BLADE MVR KNIFE 20G (BLADE) IMPLANT
BNDG EYE OVAL (GAUZE/BANDAGES/DRESSINGS) IMPLANT
CABLE BIPOLOR RESECTION CORD (MISCELLANEOUS) ×2 IMPLANT
CANNULA ANT CHAM MAIN (OPHTHALMIC RELATED) IMPLANT
CANNULA ANTERIOR CHAMBER 27GA (MISCELLANEOUS) IMPLANT
CANNULA DUAL BORE 23G (CANNULA) IMPLANT
CANNULA DUALBORE 25G (CANNULA) IMPLANT
CANNULA VLV SOFT TIP 25GA (OPHTHALMIC) ×2 IMPLANT
CAUTERY EYE LOW TEMP 1300F FIN (OPHTHALMIC RELATED) IMPLANT
CLSR STERI-STRIP ANTIMIC 1/2X4 (GAUZE/BANDAGES/DRESSINGS) ×2 IMPLANT
CORD BIPOLAR FORCEPS 12FT (ELECTRODE) ×2 IMPLANT
COVER MAYO STAND STRL (DRAPES) IMPLANT
COVER WAND RF STERILE (DRAPES) ×2 IMPLANT
DRAPE HALF SHEET 40X57 (DRAPES) ×2 IMPLANT
DRAPE INCISE 51X51 W/FILM STRL (DRAPES) IMPLANT
DRAPE RETRACTOR (MISCELLANEOUS) ×2 IMPLANT
ERASER HMR WETFIELD 23G BP (MISCELLANEOUS) IMPLANT
FILTER BLUE MILLIPORE (MISCELLANEOUS) IMPLANT
FORCEPS ECKARDT ILM 25G SERR (OPHTHALMIC RELATED) IMPLANT
FORCEPS GRIESHABER ILM 25G A (INSTRUMENTS) ×2 IMPLANT
GAS AUTO FILL CONSTEL (OPHTHALMIC) ×2
GAS AUTO FILL CONSTELLATION (OPHTHALMIC) ×1 IMPLANT
GAS WRIST BAND GREEN (MISCELLANEOUS)
GLOVE BIO SURGEON STRL SZ7.5 (GLOVE) ×4 IMPLANT
GOWN STRL REUS W/ TWL LRG LVL3 (GOWN DISPOSABLE) ×2 IMPLANT
GOWN STRL REUS W/TWL LRG LVL3 (GOWN DISPOSABLE) ×2
HANDLE PNEUMATIC FOR CONSTEL (OPHTHALMIC) ×4 IMPLANT
KIT BASIN OR (CUSTOM PROCEDURE TRAY) ×2 IMPLANT
KIT TURNOVER KIT B (KITS) ×2 IMPLANT
LENS BIOM SUPER VIEW SET DISP (MISCELLANEOUS) ×2 IMPLANT
MICROPICK 25G (MISCELLANEOUS)
NEEDLE 18GX1X1/2 (RX/OR ONLY) (NEEDLE) ×2 IMPLANT
NEEDLE 25GX 5/8IN NON SAFETY (NEEDLE) ×2 IMPLANT
NEEDLE FILTER BLUNT 18X 1/2SAF (NEEDLE)
NEEDLE FILTER BLUNT 18X1 1/2 (NEEDLE) IMPLANT
NEEDLE HYPO 25GX1X1/2 BEV (NEEDLE) IMPLANT
NEEDLE HYPO 30X.5 LL (NEEDLE) ×4 IMPLANT
NEEDLE RETROBULBAR 25GX1.5 (NEEDLE) ×2 IMPLANT
NS IRRIG 1000ML POUR BTL (IV SOLUTION) ×2 IMPLANT
PACK FRAGMATOME (OPHTHALMIC) IMPLANT
PACK VITRECTOMY CUSTOM (CUSTOM PROCEDURE TRAY) ×2 IMPLANT
PAD ARMBOARD 7.5X6 YLW CONV (MISCELLANEOUS) ×4 IMPLANT
PAK PIK VITRECTOMY CVS 25GA (OPHTHALMIC) ×6 IMPLANT
PENCIL BIPOLAR 25GA STR DISP (OPHTHALMIC RELATED) IMPLANT
PICK MICROPICK 25G (MISCELLANEOUS) IMPLANT
PROBE LASER ILLUM FLEX CVD 25G (OPHTHALMIC) ×2 IMPLANT
ROLLS DENTAL (MISCELLANEOUS) IMPLANT
SCRAPER DIAMOND 25GA (OPHTHALMIC RELATED) IMPLANT
STOCKINETTE IMPERVIOUS 9X36 MD (GAUZE/BANDAGES/DRESSINGS) ×4 IMPLANT
STOCKINETTE IMPERVIOUS LG (DRAPES) ×4 IMPLANT
STOPCOCK 4 WAY LG BORE MALE ST (IV SETS) IMPLANT
SUT ETHILON 8 0 TG100 8 (SUTURE) IMPLANT
SUT VICRYL 7 0 TG140 8 (SUTURE) IMPLANT
SUT VICRYL 8 0 TG140 8 (SUTURE) IMPLANT
SUT VICRYL ABS 6-0 S29 18IN (SUTURE) IMPLANT
SYR 10ML LL (SYRINGE) IMPLANT
SYR 20ML LL LF (SYRINGE) ×2 IMPLANT
SYR 5ML LL (SYRINGE) ×2 IMPLANT
SYR TB 1ML LUER SLIP (SYRINGE) IMPLANT
TAPE SURG TRANSPORE 1 IN (GAUZE/BANDAGES/DRESSINGS) ×1 IMPLANT
TAPE SURGICAL TRANSPORE 1 IN (GAUZE/BANDAGES/DRESSINGS) ×1
TOWEL GREEN STERILE FF (TOWEL DISPOSABLE) ×2 IMPLANT
TUBE CONNECTING 12X1/4 (SUCTIONS) IMPLANT
WATER STERILE IRR 1000ML POUR (IV SOLUTION) ×2 IMPLANT

## 2019-06-21 NOTE — Anesthesia Postprocedure Evaluation (Signed)
Anesthesia Post Note  Patient: Dennis Nations Marcil Sr.  Procedure(s) Performed: PARS PLANA VITRECTOMY WITH 25 GAUGE (Left Eye) PHOTOCOAGULATION WITH LASER (Left Eye) GAS/FLUID EXCHANGE (Left Eye) Membrane Peel (Left Eye) C3F8 (Left Eye)     Patient location during evaluation: PACU Anesthesia Type: General Level of consciousness: awake and alert Pain management: pain level controlled Vital Signs Assessment: post-procedure vital signs reviewed and stable Respiratory status: spontaneous breathing, nonlabored ventilation, respiratory function stable and patient connected to nasal cannula oxygen Cardiovascular status: blood pressure returned to baseline and stable Postop Assessment: no apparent nausea or vomiting Anesthetic complications: no    Last Vitals:  Vitals:   06/21/19 2036 06/21/19 2049  BP: (!) 143/97 (!) 143/96  Pulse: 92 88  Resp: 18 18  Temp:  36.8 C  SpO2: 100% 98%    Last Pain:  Vitals:   06/21/19 2049  TempSrc:   PainSc: 0-No pain                 Erdem Naas P Edras [place]

## 2019-06-21 NOTE — Discharge Instructions (Signed)
Leave dressing in place. Do not remove dressing. Keep [employer] on you. You may sit up at 90 degrees when awake and lay on your right side only when resting.

## 2019-06-21 NOTE — Anesthesia Procedure Notes (Signed)
Procedure Name: Intubation Date/Time: 06/21/2019 6:58 PM Performed by: Shirlyn Goltz, CRNA Pre-anesthesia Checklist: Patient identified, Emergency Drugs available, Suction available and Patient being monitored Patient Re-evaluated:Patient Re-evaluated prior to induction Oxygen Delivery Method: Circle system utilized Preoxygenation: Pre-oxygenation with 100% oxygen Induction Type: IV induction Ventilation: Mask ventilation without difficulty Laryngoscope Size: Mac and 4 Grade View: Grade II Tube type: Oral Tube size: 7.5 mm Number of attempts: 1 Airway Equipment and Method: Stylet Placement Confirmation: ETT inserted through vocal cords under direct vision,  positive ETCO2 and breath sounds checked- equal and bilateral Secured at: 22 cm Tube secured with: Tape Dental Injury: Teeth and Oropharynx as per pre-operative assessment

## 2019-06-21 NOTE — Transfer of Care (Signed)
Immediate Anesthesia Transfer of Care Note  Patient: Dennis Nations Marsteller Sr.  Procedure(s) Performed: PARS PLANA VITRECTOMY WITH 25 GAUGE (Left Eye) PHOTOCOAGULATION WITH LASER (Left Eye) GAS/FLUID EXCHANGE (Left Eye) Membrane Peel (Left Eye) C3F8 (Left Eye)  Patient Location: PACU  Anesthesia Type:General  Level of Consciousness: awake, alert , oriented and patient cooperative  Airway & Oxygen Therapy: Patient Spontanous Breathing and Patient connected to nasal cannula oxygen  Post-op Assessment: Report given to RN and Post -op Vital signs reviewed and stable  Post vital signs: Reviewed and stable  Last Vitals:  Vitals Value Taken Time  BP 143/97 06/21/19 2036  Temp    Pulse 90 06/21/19 2039  Resp 18 06/21/19 2039  SpO2 95 % 06/21/19 2039  Vitals shown include unvalidated device data.  Last Pain:  Vitals:   06/21/19 1642  TempSrc: Oral         Complications: No apparent anesthesia complications

## 2019-06-21 NOTE — Brief Op Note (Signed)
06/21/2019  8:17 PM  PATIENT:  Dennis Nations Daubert Sr.  73 y.o. male  PRE-OPERATIVE DIAGNOSIS:  RETINAL DETACHMENT LEFT EYE  POST-OPERATIVE DIAGNOSIS:  * No post-op diagnosis entered *  PROCEDURE:  Procedure(s): PARS PLANA VITRECTOMY WITH 25 GAUGE (Left) PHOTOCOAGULATION WITH LASER (Left) GAS/FLUID EXCHANGE (Left)  SURGEON:  Surgeon(s) and Role:    Sherlynn Stalls, MD - Primary  PHYSICIAN ASSISTANT:   ASSISTANTS: none   ANESTHESIA:   general  EBL:  minimal   BLOOD ADMINISTERED:none  DRAINS: none   LOCAL MEDICATIONS USED:  BUPIVICAINE   SPECIMEN:  No Specimen  DISPOSITION OF SPECIMEN:  N/A  COUNTS:  YES  TOURNIQUET:  * No tourniquets in log *  DICTATION: .Other Dictation: Dictation Number [id number]  PLAN OF CARE: Discharge to home after PACU  PATIENT DISPOSITION:  PACU - hemodynamically stable.   Delay start of Pharmacological VTE agent (>24hrs) due to surgical blood loss or risk of bleeding: yes

## 2019-06-21 NOTE — Anesthesia Preprocedure Evaluation (Addendum)
Anesthesia Evaluation  Patient identified by MRN, date of birth, ID band Patient awake    Reviewed: Allergy & Precautions, NPO status , Patient's Chart, lab work & pertinent test results  History of Anesthesia Complications (+) PONV and history of anesthetic complications  Airway Mallampati: II  TM Distance: >3 FB Neck ROM: Full    Dental  (+) Chipped   Pulmonary sleep apnea and Continuous Positive Airway Pressure Ventilation ,    Pulmonary exam normal breath sounds clear to auscultation       Cardiovascular hypertension (no meds), + CAD and + Past MI  Normal cardiovascular exam+ dysrhythmias Atrial Fibrillation  Rhythm:Regular Rate:Normal  ECG: NSR, rate 87  '20 TTE - EF 60-65%. LV cavity size was mildly dilated. Left ventricular diastolic Doppler parameters are consistent with pseudonormalization. Left atrial size was mild-moderately dilated. No valvulopathy identified    Neuro/Psych PSYCHIATRIC DISORDERS Depression  Parkinson's disease Vertigo  TIACVA, No Residual Symptoms    GI/Hepatic negative GI ROS, Neg liver ROS,   Endo/Other  Hypothyroidism Morbid obesity Addison's disease   Renal/GU negative Renal ROS     Musculoskeletal  (+) Arthritis ,  Gout Chronic back pain    Abdominal (+) + obese,   Peds  Hematology Gout HLD   Anesthesia Other Findings Retinal detachment   Reproductive/Obstetrics                           Anesthesia Physical Anesthesia Plan  ASA: III  Anesthesia Plan: General   Post-op Pain Management:    Induction: Intravenous  PONV Risk Score and Plan: 3 and Treatment may vary due to age or medical condition, Ondansetron, Dexamethasone and Midazolam  Airway Management Planned: Oral ETT  Additional Equipment: None  Intra-op Plan:   Post-operative Plan: Extubation in OR  Informed Consent: I have reviewed the patients History and Physical, chart, labs  and discussed the procedure including the risks, benefits and alternatives for the proposed anesthesia with the patient or authorized representative who has indicated his/her understanding and acceptance.     Dental advisory given  Plan Discussed with: CRNA  Anesthesia Plan Comments: (Covid test negative)       Anesthesia Quick Evaluation
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
## 2019-06-22 ENCOUNTER — Encounter: Payer: Self-pay | Admitting: Family Medicine

## 2019-06-22 NOTE — Op Note (Signed)
NAME: [name], [name] MEDICAL RECORD [id number] ACCOUNT [id number] DATE OF BIRTH:[birth year] FACILITY: [facility] LOCATION: [facility] PHYSICIAN:Adelise Buswell Greg Cutter, MD  OPERATIVE REPORT  DATE OF PROCEDURE:  06/21/2019  SURGEON:  Sherlynn Stalls, MD   ANESTHESIA:  General with endotracheal intubation.  PREOPERATIVE DIAGNOSIS:  Retinal detachment, left eye.  POSTOPERATIVE DIAGNOSIS:  Retinal detachment, left eye.  OPERATION PROCEDURE:  A pars plana vitrectomy with endocautery membrane peeling, endolaser, gas fluid exchange using 14% C3F8 gas.  COMPLICATIONS:  None.  FINDINGS:  There is a macula on retinal detachment in the temporal retina of the left eye.  DESCRIPTION OF PROCEDURE:  The patient was identified in the preoperative holding area.  He was then taken to the operating room where he was placed under general anesthesia.  At that point in time, the left eye was anesthetized for postoperative pain  control using a retrobulbar block consisting of a 1:1 mixture of 0.75% bupivacaine and 1% lidocaine in 50 units of Hylenex.  After excellent akinesia and anesthesia was obtained, the left eye was prepped and draped in the usual sterile fashion for ocular  surgery.  A Lieberman speculum was placed between the left upper and lower eyelids for exposure.  Next, a 25-gauge trocar was used to introduce transconjunctival cannulas in the inferotemporal, supratemporal, and supranasal quadrants.  The trocars were  removed, leaving the cannulas in place.  An infusion cannula was connected to the inferior temporal cannula and confirmed to be in the vitreous cavity prior to its use.  The eye then underwent a core vitrectomy with the vitreous cutter and light pipe.   This was performed without difficulty and vitreous was shaved out to the periphery.  The eye was then inspected with scleral depression.  Retinal breaks were located in the temporal periphery marked using endocautery and endolaser.  Once this  was  completed, a gas fluid exchange performed using a soft tip extrusion cannula.  Residual posterior subretinal fluid was identified.  This was accessed by creating a small retinotomy using the endocautery probe in the superior temporal posterior pole  outside the macula.  Endocautery probe was removed and a soft tip extrusion cannula was used to access and remove the remaining subretinal fluid.  At this point, the retina was in excellent position.  Endolaser photocoagulation was then placed around the  retinotomy and all the retinal breaks.  Next, the eye was flushed with a 14% concentration of C3F8 gas.  After this was completed, the cannulas were removed and the sclerotomies.  Sclerotomies were inspected and found to be airtight.  The eye was then  treated with subconjunctival injections 50 mg Ancef to 1 mg dexamethasone.  I treated topically with one drop of 1% atropine and TobraDex ointment.  The speculum was removed.  Eyelids were cleaned and closed with a patch and shield.  The patient was then  taken to recovery in excellent condition, having tolerated the procedure well.  PN/NUANCE  D:06/21/2019 T:06/22/2019 JOB:[id number]

## 2019-06-24 NOTE — Telephone Encounter (Signed)
Will send to [facility] as Juluis Rainier

## 2019-07-16 DIAGNOSIS — M961 Postlaminectomy syndrome, not elsewhere classified: Secondary | ICD-10-CM | POA: Diagnosis not present

## 2019-07-16 DIAGNOSIS — Z9689 Presence of other specified functional implants: Secondary | ICD-10-CM | POA: Diagnosis not present

## 2019-07-16 DIAGNOSIS — M792 Neuralgia and neuritis, unspecified: Secondary | ICD-10-CM | POA: Diagnosis not present

## 2019-07-17 DIAGNOSIS — H33012 Retinal detachment with single break, left eye: Secondary | ICD-10-CM | POA: Diagnosis not present

## 2019-07-17 DIAGNOSIS — H31093 Other chorioretinal scars, bilateral: Secondary | ICD-10-CM | POA: Diagnosis not present

## 2019-07-30 ENCOUNTER — Encounter: Payer: Self-pay | Admitting: Family Medicine

## 2019-07-30 NOTE — Telephone Encounter (Signed)
Dennis [name]

## 2019-08-01 ENCOUNTER — Encounter: Payer: Self-pay | Admitting: Family Medicine

## 2019-08-05 DIAGNOSIS — M150 Primary generalized (osteo)arthritis: Secondary | ICD-10-CM | POA: Diagnosis not present

## 2019-08-05 DIAGNOSIS — M1A09X1 Idiopathic chronic gout, multiple sites, with tophus (tophi): Secondary | ICD-10-CM | POA: Diagnosis not present

## 2019-08-09 ENCOUNTER — Encounter: Payer: Self-pay | Admitting: Family Medicine

## 2019-08-14 DIAGNOSIS — H31092 Other chorioretinal scars, left eye: Secondary | ICD-10-CM | POA: Diagnosis not present

## 2019-08-19 ENCOUNTER — Encounter (HOSPITAL_COMMUNITY): Payer: Self-pay | Admitting: Emergency Medicine

## 2019-08-19 ENCOUNTER — Emergency Department (HOSPITAL_COMMUNITY)
Admission: EM | Admit: 2019-08-19 | Discharge: 2019-08-20 | Disposition: A | Discharge status: Home / Self Care | Payer: No Typology Code available for payment source | Attending: Emergency Medicine | Admitting: Emergency Medicine

## 2019-08-19 ENCOUNTER — Other Ambulatory Visit: Payer: Self-pay

## 2019-08-19 DIAGNOSIS — I110 Hypertensive heart disease with heart failure: Secondary | ICD-10-CM | POA: Insufficient documentation

## 2019-08-19 DIAGNOSIS — E039 Hypothyroidism, unspecified: Secondary | ICD-10-CM | POA: Diagnosis not present

## 2019-08-19 DIAGNOSIS — M545 Low back pain: Secondary | ICD-10-CM | POA: Diagnosis present

## 2019-08-19 DIAGNOSIS — M79604 Pain in right leg: Secondary | ICD-10-CM | POA: Insufficient documentation

## 2019-08-19 DIAGNOSIS — M5441 Lumbago with sciatica, right side: Secondary | ICD-10-CM | POA: Insufficient documentation

## 2019-08-19 DIAGNOSIS — M5136 Other intervertebral disc degeneration, lumbar region: Secondary | ICD-10-CM | POA: Insufficient documentation

## 2019-08-19 DIAGNOSIS — I5032 Chronic diastolic (congestive) heart failure: Secondary | ICD-10-CM | POA: Diagnosis not present

## 2019-08-19 DIAGNOSIS — Z79899 Other long term (current) drug therapy: Secondary | ICD-10-CM | POA: Diagnosis not present

## 2019-08-19 DIAGNOSIS — M5137 Other intervertebral disc degeneration, lumbosacral region: Secondary | ICD-10-CM

## 2019-08-19 DIAGNOSIS — I2510 Atherosclerotic heart disease of native coronary artery without angina pectoris: Secondary | ICD-10-CM | POA: Insufficient documentation

## 2019-08-19 MED ORDER — LEVOTHYROXINE SODIUM 137 MCG PO TABS
274.0000 ug | ORAL_TABLET | Freq: Every day | ORAL | Status: DC
  Administered 2019-08-20: 10:00:00 274 ug via ORAL
  Filled 2019-08-19: qty 2

## 2019-08-19 MED ORDER — COLCHICINE 0.6 MG PO TABS
0.6000 mg | ORAL_TABLET | Freq: Two times a day (BID) | ORAL | Status: DC
  Administered 2019-08-20: 0.6 mg via ORAL
  Filled 2019-08-19 (×2): qty 1

## 2019-08-19 MED ORDER — DULOXETINE HCL 60 MG PO CPEP
60.0000 mg | ORAL_CAPSULE | Freq: Every day | ORAL | Status: DC
  Administered 2019-08-20: 10:00:00 60 mg via ORAL
  Filled 2019-08-19: qty 1

## 2019-08-19 MED ORDER — POTASSIUM CHLORIDE CRYS ER 10 MEQ PO TBCR
10.0000 meq | EXTENDED_RELEASE_TABLET | Freq: Every day | ORAL | Status: DC
  Administered 2019-08-20: 10 meq via ORAL
  Filled 2019-08-19: qty 1

## 2019-08-19 MED ORDER — CARBIDOPA-LEVODOPA 25-250 MG PO TABS
2.0000 | ORAL_TABLET | ORAL | Status: DC
  Administered 2019-08-20 (×3): 2 via ORAL
  Filled 2019-08-19 (×6): qty 2

## 2019-08-19 MED ORDER — HYDROCORTISONE 10 MG PO TABS: 10.0000 mg | ORAL_TABLET | ORAL | Status: DC

## 2019-08-19 MED ORDER — ATORVASTATIN CALCIUM 10 MG PO TABS: 20.0000 mg | ORAL_TABLET | Freq: Every evening | ORAL | Status: DC

## 2019-08-19 MED ORDER — APIXABAN 5 MG PO TABS: 5.0000 mg | ORAL_TABLET | Freq: Two times a day (BID) | ORAL | Status: DC

## 2019-08-19 MED ORDER — FUROSEMIDE 20 MG PO TABS
40.0000 mg | ORAL_TABLET | Freq: Every day | ORAL | Status: DC
  Administered 2019-08-20: 10:00:00 40 mg via ORAL
  Filled 2019-08-19: qty 2

## 2019-08-19 MED ORDER — HYDROCORTISONE 10 MG PO TABS
10.0000 mg | ORAL_TABLET | ORAL | Status: DC
  Filled 2019-08-19: qty 1

## 2019-08-19 MED ORDER — HYDROMORPHONE HCL 1 MG/ML IJ SOLN
2.0000 mg | Freq: Once | INTRAMUSCULAR | Status: AC
  Administered 2019-08-19: 2 mg via INTRAMUSCULAR
  Filled 2019-08-19: qty 2

## 2019-08-19 MED ORDER — TOPIRAMATE 25 MG PO TABS
50.0000 mg | ORAL_TABLET | Freq: Two times a day (BID) | ORAL | Status: DC
  Administered 2019-08-20 (×2): 50 mg via ORAL
  Filled 2019-08-19 (×2): qty 2

## 2019-08-19 MED ORDER — HYDROCORTISONE 20 MG PO TABS
20.0000 mg | ORAL_TABLET | Freq: Every day | ORAL | Status: DC
  Administered 2019-08-20: 20 mg via ORAL
  Filled 2019-08-19: qty 1

## 2019-08-19 NOTE — ED Notes (Addendum)
Called and left a message 3 times for nevro representative. MRI and Dr. Eulis Foster notified that unable to get a hold of anyone at this time.

## 2019-08-19 NOTE — ED Notes (Addendum)
Called technical support at [phone]. Spoke with Anu about the nevro senza spinal cord stimulator. Per Anu, a representative will be paged to come assess stimulator before pt can have MRI done. Pt updated.
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
## 2019-08-19 NOTE — ED Notes (Addendum)
Pt updated that there is still no ETA for a representative. Pt verbalizing that he is upset. Pt stating "I don't understand why [facility] can do the MRI and you all can't." Pt upset that he has not had food since he has been here. Pt given the option to continue to wait for MRI or follow up with outpatient. Pt states he has to make a phone call before he can decide. Dr. Eulis Foster notified

## 2019-08-19 NOTE — ED Triage Notes (Signed)
Pt reports getting off the couch and noticed something happened to his back. Pt took some pain medications and went to sleep. Noticed the pain again when up using the bathroom today. Lower right side back pain. Pt has extensive back hx and has stimulator. States he is having trouble lifting legs. Dr. Ronnald Ramp is his neurosurgeon.

## 2019-08-19 NOTE — ED Notes (Signed)
Implanted medical device identification a care information:   [employer] senza spinal cord stimulator.  MRI Conditions: [email or web address].[email or web address]/mri Technical Support: [phone]  ID: [id number]  DR: Sheliah Mends   Product: IPG Model: [id number] Seriel/Lot [id number] Implant Date 05/20/2019  Product: Lead (x2) Model # [id number] Serial Lot [id number] Implant Date 05/20/2019  Product Anchor Model [phone]  Serial/Lot [id number] Implant Date 05/20/2019

## 2019-08-19 NOTE — Discharge Instructions (Addendum)
Follow-up with your doctor, or clinic for further care and treatment Please call Dr. Ronnald Ramp for follow up Return if any new weakness, loss of bowel or bladder control.

## 2019-08-19 NOTE — ED Notes (Signed)
Attempted to call MRI for update for pt with no answer. Will try again

## 2019-08-19 NOTE — ED Notes (Signed)
Assumed care of pt. Pt alert, resting on cart in NAD. Alert, breathing easy, non-labored. Call light within reach. Hourly rounds completed. Will continue to monitor

## 2019-08-19 NOTE — ED Notes (Signed)
Pt provided with ice water per request. Denies any other needs at this time. Will continue to monitor

## 2019-08-19 NOTE — ED Notes (Addendum)
Pt stating he cannot walk or be discharged due to his pain. Social worker at bedside to speak with the patient. This RN informed wife of plan of discharge. Wife verbalizing she is upset. Wife states "I am the POA and my husband is not competent to make his own decisions." Informed wife that pt has been alert, oriented, and appropriate. Wife informed of situation in detail specifically that our MRI staff per policy cannot perform an MRI on the patient until a [employer] representative comes to assess the device. Informed wife that [name] is not answering the phone. Dr. Eulis Foster notified and on phone with pt's wife

## 2019-08-19 NOTE — ED Notes (Signed)
MRI states pt has a spinal nerve stimulator, which a represenative has to test prior to MRI being preformed. MRI to call back with phone number for rep. To [phone]. Primary RN Kaelyn informed.   Pt. Updated at this time. Informed representative to review nerve stimulator for safety to et MRI

## 2019-08-20 ENCOUNTER — Encounter: Payer: Self-pay | Admitting: Family Medicine

## 2019-08-20 ENCOUNTER — Emergency Department (HOSPITAL_COMMUNITY): Payer: No Typology Code available for payment source

## 2019-08-20 DIAGNOSIS — M545 Low back pain: Secondary | ICD-10-CM | POA: Diagnosis not present

## 2019-08-20 MED ORDER — MORPHINE SULFATE (PF) 2 MG/ML IV SOLN
6.0000 mg | Freq: Once | INTRAVENOUS | Status: AC
  Administered 2019-08-20: 6 mg via INTRAMUSCULAR
  Filled 2019-08-20: qty 3

## 2019-08-20 MED ORDER — LEVOTHYROXINE SODIUM 25 MCG PO TABS: 137.0000 ug | ORAL_TABLET | Freq: Every day | ORAL | Status: DC

## 2019-08-20 MED ORDER — OXYCODONE-ACETAMINOPHEN 5-325 MG PO TABS: 2.0000 | ORAL_TABLET | ORAL | 0 refills | Status: DC | PRN

## 2019-08-20 MED ORDER — DEXAMETHASONE SODIUM PHOSPHATE 10 MG/ML IJ SOLN: 10.0000 mg | Freq: Once | INTRAMUSCULAR | Status: DC

## 2019-08-20 MED ORDER — COLCHICINE 0.6 MG PO TABS: 0.6000 mg | ORAL_TABLET | Freq: Two times a day (BID) | ORAL | Status: DC | PRN

## 2019-08-20 MED ORDER — MORPHINE SULFATE (PF) 2 MG/ML IV SOLN: 2.0000 mg | Freq: Once | INTRAVENOUS | Status: DC

## 2019-08-20 MED ORDER — ALLOPURINOL 100 MG PO TABS
450.0000 mg | ORAL_TABLET | Freq: Every day | ORAL | Status: DC
  Administered 2019-08-20: 13:00:00 450 mg via ORAL
  Filled 2019-08-20: qty 5

## 2019-08-20 MED ORDER — DEXAMETHASONE SODIUM PHOSPHATE 10 MG/ML IJ SOLN
10.0000 mg | Freq: Once | INTRAMUSCULAR | Status: AC
  Administered 2019-08-20: 10 mg via INTRAMUSCULAR
  Filled 2019-08-20: qty 1

## 2019-08-20 MED ORDER — DORZOLAMIDE HCL-TIMOLOL MAL 2-0.5 % OP SOLN
1.0000 [drp] | Freq: Two times a day (BID) | OPHTHALMIC | Status: DC
  Administered 2019-08-20: 1 [drp] via OPHTHALMIC
  Filled 2019-08-20: qty 10

## 2019-08-20 NOTE — ED Notes (Signed)
PT at bedside.

## 2019-08-20 NOTE — ED Notes (Signed)
Report given to and care endorsed to [place], [place]

## 2019-08-20 NOTE — Evaluation (Signed)
Physical Therapy Evaluation Patient Details Name: Dennis Kanas Crosby Sr. MRN: [id number] DOB: 1946/02/11 Today's Date: 08/20/2019   History of Present Illness  Pt is a 74 y/o male presenting to ED secondary to increased back pain. MRI showed new extrusion on S1 nerve root. PMH includes dCHF, Parkinson's disease, CAD, MI, CVA, HTN, gout, and OSA on CPAP.   Clinical Impression  Pt presenting with problem above and deficits below. Pt reporting 10/10 back pain with mobility. Was limited to a few steps at the EOB, and had to return to sitting secondary to increased nausea. Educated about SNF, however, pt refusing. Pt also refusing HHPT and stating he was being set up with neuro outpatient PT. Educated about using power chair to increase safety. Will continue to follow acutely to maximize functional mobility independence and safety.     Follow Up Recommendations Outpatient PT;Supervision/Assistance - 24 hour(pt refusing HHPT and SNF )    Equipment Recommendations  None recommended by PT    Recommendations for Other Services       Precautions / Restrictions Precautions Precautions: Fall Restrictions Weight Bearing Restrictions: No      Mobility  Bed Mobility               General bed mobility comments: Sitting EOB upon entry   Transfers Overall transfer level: Needs assistance Equipment used: Rolling walker (2 wheeled) Transfers: Sit to/from Stand Sit to Stand: Min assist         General transfer comment: Min A for steadying assist. Increased time required to perform.   Ambulation/Gait Ambulation/Gait assistance: Min guard Gait Distance (Feet): 1 Feet Assistive device: Rolling walker (2 wheeled)   Gait velocity: Decreased   General Gait Details: Took a couple of steps away from bed, however, pt reporting increased pain and nausea, so returned to sitting. Min guard for safety.   Stairs            Wheelchair Mobility    Modified Rankin (Stroke Patients Only)        Balance Overall balance assessment: Needs assistance Sitting-balance support: No upper extremity supported;Feet supported Sitting balance-Leahy Scale: Fair     Standing balance support: Bilateral upper extremity supported;During functional activity Standing balance-Leahy Scale: Poor Standing balance comment: Reliant on BUE support                              Pertinent Vitals/Pain Pain Assessment: 0-10 Pain Score: 10-Worst pain ever Pain Location: back Pain Descriptors / Indicators: Aching;Guarding;Grimacing Pain Intervention(s): Monitored during session;Limited activity within patient's tolerance;Repositioned    Home Living Family/patient expects to be discharged to:: Private residence Living Arrangements: Spouse/significant other Available Help at Discharge: Family Type of Home: House Home Access: Ramped entrance     Home Layout: Able to live on main level with bedroom/bathroom Home Equipment: Cane - single point;Shower seat;Grab bars - tub/shower;Electric scooter;Wheelchair - Education administrator (comment)(U walker)      Prior Function Level of Independence: Independent with assistive device(s)         Comments: Reports using power chair outside of home and uses U walker when inside of home     Hand Dominance        Extremity/Trunk Assessment   Upper Extremity Assessment Upper Extremity Assessment: Generalized weakness    Lower Extremity Assessment Lower Extremity Assessment: Generalized weakness    Cervical / Trunk Assessment Cervical / Trunk Assessment: Other exceptions Cervical / Trunk Exceptions: chronic back pain  Communication   Communication: No difficulties  Cognition Arousal/Alertness: Suspect due to medications Behavior During Therapy: Flat affect Overall Cognitive Status: No family/caregiver present to determine baseline cognitive functioning                                 General Comments: Pt reports feeling  "out of it" secondary to medications. Somewhat slow to respond to questions.       General Comments      Exercises     Assessment/Plan    PT Assessment Patient needs continued PT services  PT Problem List Decreased strength;Decreased balance;Decreased mobility;Decreased activity tolerance;Decreased knowledge of use of DME;Decreased knowledge of precautions;Decreased cognition;Decreased safety awareness;Pain       PT Treatment Interventions Gait training;DME instruction;Functional mobility training;Therapeutic exercise;Therapeutic activities;Balance training;Patient/family education;Wheelchair mobility training    PT Goals (Current goals can be found in the Care Plan section)  Acute Rehab PT Goals Patient Stated Goal: to decrease back pain  PT Goal Formulation: With patient Time For Goal Achievement: 09/03/19 Potential to Achieve Goals: Fair    Frequency Min 3X/week   Barriers to discharge        Co-evaluation               AM-PAC PT "6 Clicks" Mobility  Outcome Measure Help needed turning from your back to your side while in a flat bed without using bedrails?: A Little Help needed moving from lying on your back to sitting on the side of a flat bed without using bedrails?: A Little Help needed moving to and from a bed to a chair (including a wheelchair)?: A Little Help needed standing up from a chair using your arms (e.g., wheelchair or bedside chair)?: A Little Help needed to walk in hospital room?: A Lot Help needed climbing 3-5 steps with a railing? : A Lot 6 Click Score: 16    End of Session   Activity Tolerance: Patient limited by pain;Treatment limited secondary to medical complications (Comment)(nausea) Patient left: in bed;with call bell/phone within reach(sitting EOB ) Nurse Communication: Mobility status PT Visit Diagnosis: Other abnormalities of gait and mobility (R26.89);Muscle weakness (generalized) (M62.81);Difficulty in walking, not elsewhere  classified (R26.2)    Time: 1300-1315 PT Time Calculation (min) (ACUTE ONLY): 15 min   Charges:   PT Evaluation $PT Eval Moderate Complexity: 1 Mod          Reuel Derby, PT, DPT  Acute Rehabilitation Services  Pager: [phone] Office: [phone]   Rudean Hitt 08/20/2019, 3:48 PM

## 2019-08-20 NOTE — ED Notes (Addendum)
Meds given per MAR. Name/DOB verified with pt Sandwich and ice water provided

## 2019-08-20 NOTE — ED Notes (Signed)
Pt d/c home per MD order. Pt reports his son is discharge ride home. Discharge summary reviewed, pt verbalizes understanding. Pt off unit via WC

## 2019-08-20 NOTE — ED Notes (Signed)
Pt transported to MRI 

## 2019-08-20 NOTE — ED Notes (Signed)
Lunch ordered 

## 2019-08-20 NOTE — ED Provider Notes (Addendum)
Patient signed out to me with back pain pending MRI.  MRI resulted with S1 disc with protrusion.  Patient examined and does not appear to have any acute neurological deficit. Discussed with neurosurgery.  Patient has seen Dr. Ronnald Ramp past.  Patient given Decadron and morphine here.  We are attempting to ambulate him.  If patient is able to ambulate, plan discharged home to follow-up outpatient with neurosurgery. Patient ambulatory and states ready for discharge.  Will prescribe oxycodone and patient advised of return precautions and need for follow-up   Dennis Boss, MD 08/20/19 [id number]    Dennis Boss, MD 08/20/19 1409

## 2019-08-22 ENCOUNTER — Ambulatory Visit: Payer: Medicare Other

## 2019-08-22 DIAGNOSIS — M5126 Other intervertebral disc displacement, lumbar region: Secondary | ICD-10-CM | POA: Diagnosis not present

## 2019-09-03 ENCOUNTER — Encounter: Payer: Self-pay | Admitting: Family Medicine

## 2019-09-04 ENCOUNTER — Other Ambulatory Visit: Payer: Self-pay | Admitting: Family Medicine

## 2019-09-04 DIAGNOSIS — M5442 Lumbago with sciatica, left side: Secondary | ICD-10-CM

## 2019-09-04 DIAGNOSIS — G20 Parkinson's disease: Secondary | ICD-10-CM

## 2019-09-04 DIAGNOSIS — G8929 Other chronic pain: Secondary | ICD-10-CM

## 2019-09-04 DIAGNOSIS — I6340 Cerebral infarction due to embolism of unspecified cerebral artery: Secondary | ICD-10-CM

## 2019-09-04 DIAGNOSIS — G20C Parkinsonism, unspecified: Secondary | ICD-10-CM

## 2019-09-05 NOTE — Telephone Encounter (Signed)
Rx faxed back to [facility] in [place], [place] @ [phone] Confirmation received.  Pt made aware. Nothing further needed.

## 2019-09-09 ENCOUNTER — Encounter: Payer: Self-pay | Admitting: Family Medicine

## 2019-09-10 ENCOUNTER — Telehealth: Payer: Self-pay

## 2019-09-10 ENCOUNTER — Other Ambulatory Visit: Payer: Self-pay

## 2019-09-10 ENCOUNTER — Other Ambulatory Visit (INDEPENDENT_AMBULATORY_CARE_PROVIDER_SITE_OTHER): Payer: Medicare Other

## 2019-09-10 ENCOUNTER — Ambulatory Visit (INDEPENDENT_AMBULATORY_CARE_PROVIDER_SITE_OTHER): Payer: Medicare Other

## 2019-09-10 VITALS — Ht 77.0 in | Wt 360.0 lb

## 2019-09-10 DIAGNOSIS — R739 Hyperglycemia, unspecified: Secondary | ICD-10-CM | POA: Diagnosis not present

## 2019-09-10 DIAGNOSIS — Z0000 Encounter for general adult medical examination without abnormal findings: Secondary | ICD-10-CM | POA: Diagnosis not present

## 2019-09-10 DIAGNOSIS — E7800 Pure hypercholesterolemia, unspecified: Secondary | ICD-10-CM | POA: Diagnosis not present

## 2019-09-10 DIAGNOSIS — E291 Testicular hypofunction: Secondary | ICD-10-CM | POA: Diagnosis not present

## 2019-09-10 LAB — TESTOSTERONE: Testosterone: 132.98 ng/dL — ABNORMAL LOW (ref 300.00–890.00)

## 2019-09-10 LAB — LIPID PANEL
Cholesterol: 267 mg/dL — ABNORMAL HIGH (ref 0–200)
HDL: 38.7 mg/dL — ABNORMAL LOW (ref >39.00)
LDL Cholesterol: 193 mg/dL — ABNORMAL HIGH (ref 0–99)
NonHDL: 228.27
Total CHOL/HDL Ratio: 7
Triglycerides: 176 mg/dL — ABNORMAL HIGH (ref 0.0–149.0)
VLDL: 35.2 mg/dL (ref 0.0–40.0)

## 2019-09-10 NOTE — Progress Notes (Signed)
PCP notes:  Health Maintenance: Flu vaccine- due   Abnormal Screenings: none   Patient concerns: Patient says he sent a note to provider.   Nurse concerns: none   Next PCP appt.: 09/13/2019 @ 12 pm
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
## 2019-09-10 NOTE — Telephone Encounter (Signed)
Error

## 2019-09-10 NOTE — Patient Instructions (Signed)
Dennis [name] , Thank you for taking time to come for your Medicare Wellness Visit. I appreciate your ongoing commitment to your health goals. Please review the following plan we discussed and let me know if I can assist you in the future.   Screening recommendations/referrals: Colonoscopy: Up to date, completed 03/02/2018 Recommended yearly ophthalmology/optometry visit for glaucoma screening and checkup Recommended yearly dental visit for hygiene and checkup  Vaccinations: Influenza vaccine: due Pneumococcal vaccine: Completed series Tdap vaccine: Up to date, completed 10/01/2009 Shingles vaccine: discussed    Advanced directives: Please bring a copy of your POA (Power of West) and/or Living Will to your next appointment.   Conditions/risks identified: hypertension  Next appointment: 09/13/2019 @ 12 pm   Preventive Care 65 Years and Older, Male Preventive care refers to lifestyle choices and visits with your health care provider that can promote health and wellness. What does preventive care include?  A yearly physical exam. This is also called an annual well check.  Dental exams once or twice a year.  Routine eye exams. Ask your health care provider how often you should have your eyes checked.  Personal lifestyle choices, including:  Daily care of your teeth and gums.  Regular physical activity.  Eating a healthy diet.  Avoiding tobacco and drug use.  Limiting alcohol use.  Practicing safe sex.  Taking low doses of aspirin every day.  Taking vitamin and mineral supplements as recommended by your health care provider. What happens during an annual well check? The services and screenings done by your health care provider during your annual well check will depend on your age, overall health, lifestyle risk factors, and family history of disease. Counseling  Your health care provider may ask you questions about your:  Alcohol use.  Tobacco use.  Drug use.  Emotional  well-being.  Home and relationship well-being.  Sexual activity.  Eating habits.  History of falls.  Memory and ability to understand (cognition).  Work and work Statistician. Screening  You may have the following tests or measurements:  Height, weight, and BMI.  Blood pressure.  Lipid and cholesterol levels. These may be checked every 5 years, or more frequently if you are over 65 years old.  Skin check.  Lung cancer screening. You may have this screening every year starting at age 46 if you have a 30-pack-year history of smoking and currently smoke or have quit within the past 15 years.  Fecal occult blood test (FOBT) of the stool. You may have this test every year starting at age 61.  Flexible sigmoidoscopy or colonoscopy. You may have a sigmoidoscopy every 5 years or a colonoscopy every 10 years starting at age 74.  Prostate cancer screening. Recommendations will vary depending on your family history and other risks.  Hepatitis C blood test.  Hepatitis B blood test.  Sexually transmitted disease (STD) testing.  Diabetes screening. This is done by checking your blood sugar (glucose) after you have not eaten for a while (fasting). You may have this done every 1-3 years.  Abdominal aortic aneurysm (AAA) screening. You may need this if you are a current or former smoker.  Osteoporosis. You may be screened starting at age 60 if you are at high risk. Talk with your health care provider about your test results, treatment options, and if necessary, the need for more tests. Vaccines  Your health care provider may recommend certain vaccines, such as:  Influenza vaccine. This is recommended every year.  Tetanus, diphtheria, and acellular pertussis (  Tdap, Td) vaccine. You may need a Td booster every 10 years.  Zoster vaccine. You may need this after age 68.  Pneumococcal 13-valent conjugate (PCV13) vaccine. One dose is recommended after age 13.  Pneumococcal  polysaccharide (PPSV23) vaccine. One dose is recommended after age 44. Talk to your health care provider about which screenings and vaccines you need and how often you need them. This information is not intended to replace advice given to you by your health care provider. Make sure you discuss any questions you have with your health care provider. Document Released: 07/24/2015 Document Revised: 03/16/2016 Document Reviewed: 04/28/2015 Elsevier Interactive Patient Education  2017 [employer] Prevention in the Home Falls can cause injuries. They can happen to people of all ages. There are many things you can do to make your home safe and to help prevent falls. What can I do on the outside of my home?  Regularly fix the edges of walkways and driveways and fix any cracks.  Remove anything that might make you trip as you walk through a door, such as a raised step or threshold.  Trim any bushes or trees on the path to your home.  Use bright outdoor lighting.  Clear any walking paths of anything that might make someone trip, such as rocks or tools.  Regularly check to see if handrails are loose or broken. Make sure that both sides of any steps have handrails.  Any raised decks and porches should have guardrails on the edges.  Have any leaves, snow, or ice cleared regularly.  Use sand or salt on walking paths during winter.  Clean up any spills in your garage right away. This includes oil or grease spills. What can I do in the bathroom?  Use night lights.  Install grab bars by the toilet and in the tub and shower. Do not use towel bars as grab bars.  Use non-skid mats or decals in the tub or shower.  If you need to sit down in the shower, use a plastic, non-slip stool.  Keep the floor dry. Clean up any water that spills on the floor as soon as it happens.  Remove soap buildup in the tub or shower regularly.  Attach bath mats securely with double-sided non-slip rug  tape.  Do not have throw rugs and other things on the floor that can make you trip. What can I do in the bedroom?  Use night lights.  Make sure that you have a light by your bed that is easy to reach.  Do not use any sheets or blankets that are too big for your bed. They should not hang down onto the floor.  Have a firm chair that has side arms. You can use this for support while you get dressed.  Do not have throw rugs and other things on the floor that can make you trip. What can I do in the kitchen?  Clean up any spills right away.  Avoid walking on wet floors.  Keep items that you use a lot in easy-to-reach places.  If you need to reach something above you, use a strong step stool that has a grab bar.  Keep electrical cords out of the way.  Do not use floor polish or wax that makes floors slippery. If you must use wax, use non-skid floor wax.  Do not have throw rugs and other things on the floor that can make you trip. What can I do with my stairs?  Do  not leave any items on the stairs.  Make sure that there are handrails on both sides of the stairs and use them. Fix handrails that are broken or loose. Make sure that handrails are as long as the stairways.  Check any carpeting to make sure that it is firmly attached to the stairs. Fix any carpet that is loose or worn.  Avoid having throw rugs at the top or bottom of the stairs. If you do have throw rugs, attach them to the floor with carpet tape.  Make sure that you have a light switch at the top of the stairs and the bottom of the stairs. If you do not have them, ask someone to add them for you. What else can I do to help prevent falls?  Wear shoes that:  Do not have high heels.  Have rubber bottoms.  Are comfortable and fit you well.  Are closed at the toe. Do not wear sandals.  If you use a stepladder:  Make sure that it is fully opened. Do not climb a closed stepladder.  Make sure that both sides of the  stepladder are locked into place.  Ask someone to hold it for you, if possible.  Clearly mark and make sure that you can see:  Any grab bars or handrails.  First and last steps.  Where the edge of each step is.  Use tools that help you move around (mobility aids) if they are needed. These include:  Canes.  Walkers.  Scooters.  Crutches.  Turn on the lights when you go into a dark area. Replace any light bulbs as soon as they burn out.  Set up your furniture so you have a clear path. Avoid moving your furniture around.  If any of your floors are uneven, fix them.  If there are any pets around you, be aware of where they are.  Review your medicines with your doctor. Some medicines can make you feel dizzy. This can increase your chance of falling. Ask your doctor what other things that you can do to help prevent falls. This information is not intended to replace advice given to you by your health care provider. Make sure you discuss any questions you have with your health care provider. Document Released: 04/23/2009 Document Revised: 12/03/2015 Document Reviewed: 08/01/2014 Elsevier Interactive Patient Education  2017 [employer].

## 2019-09-13 ENCOUNTER — Ambulatory Visit (INDEPENDENT_AMBULATORY_CARE_PROVIDER_SITE_OTHER): Payer: Medicare Other | Admitting: Family Medicine

## 2019-09-13 ENCOUNTER — Encounter: Payer: Self-pay | Admitting: Family Medicine

## 2019-09-13 ENCOUNTER — Ambulatory Visit: Payer: Medicare Other

## 2019-09-13 ENCOUNTER — Other Ambulatory Visit: Payer: Self-pay

## 2019-09-13 VITALS — BP 122/68 | HR 113 | Temp 98.2°F | Ht 77.0 in | Wt 384.0 lb

## 2019-09-13 DIAGNOSIS — I6340 Cerebral infarction due to embolism of unspecified cerebral artery: Secondary | ICD-10-CM | POA: Diagnosis not present

## 2019-09-13 DIAGNOSIS — L299 Pruritus, unspecified: Secondary | ICD-10-CM

## 2019-09-13 DIAGNOSIS — E7800 Pure hypercholesterolemia, unspecified: Secondary | ICD-10-CM | POA: Diagnosis not present

## 2019-09-13 DIAGNOSIS — G903 Multi-system degeneration of the autonomic nervous system: Secondary | ICD-10-CM

## 2019-09-13 DIAGNOSIS — E291 Testicular hypofunction: Secondary | ICD-10-CM | POA: Diagnosis not present

## 2019-09-13 DIAGNOSIS — M5442 Lumbago with sciatica, left side: Secondary | ICD-10-CM | POA: Diagnosis not present

## 2019-09-13 DIAGNOSIS — G20 Parkinson's disease: Secondary | ICD-10-CM

## 2019-09-13 DIAGNOSIS — G8929 Other chronic pain: Secondary | ICD-10-CM

## 2019-09-13 DIAGNOSIS — M5441 Lumbago with sciatica, right side: Secondary | ICD-10-CM | POA: Diagnosis not present

## 2019-09-13 DIAGNOSIS — R635 Abnormal weight gain: Secondary | ICD-10-CM

## 2019-09-13 DIAGNOSIS — R21 Rash and other nonspecific skin eruption: Secondary | ICD-10-CM | POA: Diagnosis not present

## 2019-09-13 DIAGNOSIS — G20C Parkinsonism, unspecified: Secondary | ICD-10-CM

## 2019-09-13 DIAGNOSIS — I951 Orthostatic hypotension: Secondary | ICD-10-CM

## 2019-09-13 MED ORDER — ROSUVASTATIN CALCIUM 20 MG PO TABS: 20.0000 mg | ORAL_TABLET | Freq: Every day | ORAL | 3 refills | Status: AC

## 2019-09-13 MED ORDER — TOPIRAMATE 50 MG PO TABS: 50.0000 mg | ORAL_TABLET | Freq: Two times a day (BID) | ORAL | 1 refills | Status: DC

## 2019-09-13 NOTE — Patient Instructions (Addendum)
For groin- Zeasorb powder  For back, try hydrocortisone on moist hands  Make neurology appointment  Follow up about your testosterone

## 2019-09-13 NOTE — Progress Notes (Signed)
Subjective:    Patient ID: Dennis Fountain Sr., male    DOB: [birth year], 74 y.o.   MRN: [id number]  HPI Chief Complaint  Patient presents with  . Annual Exam    Pt hurt his back on Superbowl Sunday, herniated disc S1, right side pressing against sciatic nerve root. pt states that they do not want to do surgery. He is taking Oxycodone for pain.   This is a 74 yo male who presents today for follow up of chronic medical concerns.   Night sweats- multiple times a week. Poor sleep, awaking frequently. Daytime fatigue. Back pain.   Parkinson's- sees neurology at [facility], Dr. Maryruth Bun [phone]. Symptoms seem to be worsening, increased tremor. Difficulty with ambulation.   Back pain- taking percocet 5-325 3-4 times a day. Has follow up with neurosurgeon but was told that there is nothing that can be done regarding herniated disk and that it will get better. Some relief with meds and topical lidocaine patches. Was told that he can not do any therapy. Increased difficulty with ADLs per wife.   Groin rash- chronic, related to chemical exposures in [place]. Was examined by [place] PA recently and was recommended he keep as dry as possible. Not applying anything now. Sometimes uses hairdryer on cool to help with moisture after bathing. Per wife, patient having more difficulty with bathing, stepping over shower and is bathing 1-2 times a week.   Skin of back with itching. Saw Dermatology about 3 months ago. Multiple lesions, was told that they can't all be removed.   Weight gain- over last 2 months, activity level has significantly decreased. Some increased SOB with activity. No chest pain. Mild LE edema. No LE cellulitis per pt.   Testosterone- has not been doing injections. Needs refill from endocrine. Testosterone prior to this visit was 132.   Hyperlipidemia- currently on atorvastatin 40 mg, LDL 193  Recently received ruling on [place] case not in their favor.   Review of Systems Per HPI      Objective:   Physical Exam Vitals reviewed.  Constitutional:      General: He is not in acute distress.    Appearance: Normal appearance. He is obese. He is ill-appearing (chronically, in scooter). He is not toxic-appearing.  HENT:     Head: Normocephalic and atraumatic.  Cardiovascular:     Rate and Rhythm: Normal rate and regular rhythm.     Heart sounds: Normal heart sounds.  Pulmonary:     Effort: Pulmonary effort is normal.     Breath sounds: Normal breath sounds.  Musculoskeletal:     Cervical back: Normal range of motion and neck supple.     Right lower leg: Edema (1+) present.     Left lower leg: Edema (1+) present.  Skin:    General: Skin is warm and dry.     Comments: Back with multiple, chronic appearing raised, dry skin lesions. Mild underlying dryness, excoriations (has been scratching with backscratcher).   Neurological:     Mental Status: He is alert and oriented to person, place, and time.  Psychiatric:        Mood and Affect: Mood is depressed.       BP 122/68 (BP Location: Left Wrist, Patient Position: Sitting, Cuff Size: Normal)   Pulse (!) 113   Temp 98.2 F (36.8 C) (Temporal)   Ht 6\' 5"  (1.956 m)   Wt (!) 384 lb (174.2 kg)   SpO2 95%   BMI 45.54 kg/m  Wt Readings from Last 3 Encounters:  09/13/19 (!) 384 lb (174.2 kg)  09/10/19 (!) 360 lb (163.3 kg)  08/19/19 (!) 350 lb (158.8 kg)       Assessment & Plan:  1. Hypercholesterolemia - rosuvastatin (CRESTOR) 20 MG tablet; Take 1 tablet (20 mg total) by mouth daily.  Dispense: 90 tablet; Refill: 3  2. Primary Parkinsonism ([place]) - seems to be worsening, problems with ADLs, will ask home health to evaluate - Ambulatory referral to [place] - follow up with neurology, patient will call his established neurologist to schedule appointment  3. Dysautonomia orthostatic hypotension syndrome ([place]) - Ambulatory referral to Home Health - increased risk of falling with dizzizness  4. Chronic  bilateral low back pain with bilateral sciatica - Ambulatory referral to [place]  5. Groin rash - provided information regarding treatment, including antifungal powder, keeping area clean and dry  6. Hypogonadism in male - he will contact endocrinologist for refill of testosterone injections  7. Weight gain - increase furosemide from QOD to QD, monitor swelling and weight  8. Pruritus - avoid scratching, can apply otc hydrocortisone cream bid  This visit occurred during the SARS-CoV-2 public health emergency.  Safety protocols were in place, including screening questions prior to the visit, additional usage of staff PPE, and extensive cleaning of exam room while observing appropriate contact time as indicated for disinfecting solutions.    Over 60 minutes were spent face-to-face with the patient during this encounter and >50% of that time was spent on counseling and coordination of care   Clarene Reamer, FNP-BC  [facility], [place]  09/17/2019 11:28 AM

## 2019-09-16 ENCOUNTER — Ambulatory Visit: Payer: Medicare Other | Attending: Internal Medicine

## 2019-09-16 ENCOUNTER — Other Ambulatory Visit: Payer: Self-pay | Admitting: Family Medicine

## 2019-09-16 DIAGNOSIS — G20 Parkinson's disease: Secondary | ICD-10-CM

## 2019-09-16 DIAGNOSIS — Z23 Encounter for immunization: Secondary | ICD-10-CM

## 2019-09-16 DIAGNOSIS — I6340 Cerebral infarction due to embolism of unspecified cerebral artery: Secondary | ICD-10-CM

## 2019-09-16 DIAGNOSIS — G20C Parkinsonism, unspecified: Secondary | ICD-10-CM

## 2019-09-16 DIAGNOSIS — G903 Multi-system degeneration of the autonomic nervous system: Secondary | ICD-10-CM

## 2019-09-16 DIAGNOSIS — I951 Orthostatic hypotension: Secondary | ICD-10-CM

## 2019-09-16 NOTE — Progress Notes (Signed)
   [id number]  Name:  Dennis Attebery Offner Sr.    MRN: [id number] DOB: [birth year]  09/16/2019  Dennis [name] was observed post Covid-19 immunization for 15 minutes without incident. He was provided with Vaccine Information Sheet and instruction to access the V-Safe system.   Dennis [name] was instructed to call 911 with any severe reactions post vaccine: Marland Kitchen Difficulty breathing  . Swelling of face and throat  . A fast heartbeat  . A bad rash all over body  . Dizziness and weakness   Immunizations Administered    Name Date Dose VIS Date Route   Pfizer COVID-19 Vaccine 09/16/2019 11:41 AM 0.3 mL 06/21/2019 Intramuscular   Manufacturer: [place]   Lot: [id number]   Day: [id number]

## 2019-09-17 ENCOUNTER — Encounter: Payer: Self-pay | Admitting: Family Medicine

## 2019-09-17 ENCOUNTER — Telehealth: Payer: Self-pay | Admitting: Family Medicine

## 2019-09-17 NOTE — Telephone Encounter (Signed)
Documented in vaccine list  Nothing further needed.

## 2019-09-17 NOTE — Telephone Encounter (Signed)
Dennis [name],Dennis [name] at [facility].  She received 2 orders. One order was for Nursing and PT and the other order was for Nursing and OT.  She just needs clarification on which order is correct.

## 2019-09-19 DIAGNOSIS — M5126 Other intervertebral disc displacement, lumbar region: Secondary | ICD-10-CM | POA: Diagnosis not present

## 2019-09-19 NOTE — Telephone Encounter (Signed)
Spoke with Carlyon Shadow-- states that they received the referral for Home Health and OT but in the referral it mentions PT. Carlyon Shadow is needing it specified if PT or OT is needed or BOTH?  It was hard to tell from the referral notes which was requested because it states both in different parts.   Please advise, thanks.

## 2019-09-20 ENCOUNTER — Telehealth: Payer: Self-pay | Admitting: *Deleted

## 2019-09-20 ENCOUNTER — Other Ambulatory Visit: Payer: Self-pay | Admitting: Neurological Surgery

## 2019-09-20 DIAGNOSIS — M5126 Other intervertebral disc displacement, lumbar region: Secondary | ICD-10-CM

## 2019-09-20 NOTE — Telephone Encounter (Signed)
   Primary Cardiologist: Lauree Chandler, MD  Chart reviewed as part of pre-operative protocol coverage.   Patient with diagnosis of afib on Eliquis for anticoagulation.    Procedure: LUMBAR ESI Date of procedure: TBD  CHADS2-VASc score of  6 (CHF, HTN, AGE, stroke/tia x 2, CAD) CrCl 89 ml/min  Per previous clearance note from 03/26/2019 by [name]: "It is really a risk/benefit question. I am not sure what the incremental benfit of lovenox would be compared to stopping Eliquis for 3 days prior (vs 2 days of lovenox) vs being off of all anti-coagulation for as much as a week post procedure. We have no data to tell us differently. If he wants to proceed with the spinal stimulator then proceed. There is a small stroke risk. He is in NSR".  Therefore patient may hold anticoagulation 3 days prior to procedure. Per discussion with patient he accepts that there is a small risk of stroke off anticoagulation.  I will route this recommendation to the requesting party via Epic fax function and remove from pre-op pool.  Please call with questions.  Daune Perch, NP 09/20/2019, 4:29 PM

## 2019-09-20 NOTE — Telephone Encounter (Signed)
   [facility] Pre-operative Risk Assessment    Request for surgical clearance:  1. What type of surgery is being performed? LUMBAR ESI   2. When is this surgery scheduled? TBD   3. What type of clearance is required (medical clearance vs. Pharmacy clearance to hold med vs. Both)? PHARM  4. Are there any medications that need to be held prior to surgery and how long? ELIQUIS X 2 DAYS PRIOR  5. Practice name and name of physician performing surgery? [facility] Duck; MD NOT LISTEED   6. What is your office phone number [phone]    7.   What is your office fax number [phone]  8.   Anesthesia type (None, local, MAC, general) ? LOCAL   Julaine Hua 09/20/2019, 12:18 PM  _________________________________________________________________   (provider comments below)

## 2019-09-20 NOTE — Telephone Encounter (Signed)
Please call [facility] and tell them that I am sorry for the confusion. He needs OT to evaluate home set up, needs for additional equipment, ability to perform ADLs and safety of environment.

## 2019-09-20 NOTE — Telephone Encounter (Signed)
Left detailed message for Darlene with VO correction.  She is aware that if she needs anything further to call the office.   Nothing further needed.

## 2019-09-20 NOTE — Telephone Encounter (Signed)
I called and spoke to the patient about holding anticoagulation. He says that he has held anticoagulation several times, in the fall as approved in our office, and a couple of times through his PCP, and did not have any issues. He accepts that there is a small risk of stroke while off anticoagulation and wishes to proceed. I will fax our recommendations to the requesting provider.

## 2019-09-20 NOTE — Telephone Encounter (Signed)
Patient with diagnosis of afib on Eliquis for anticoagulation.    Procedure: LUMBAR ESI  Date of procedure: TBD  CHADS2-VASc score of  6 (CHF, HTN, AGE, stroke/tia x 2, CAD)  CrCl 89 ml/min  Per previous clearance note from 03/26/2019 by [name]   "It is really a risk/benefit question. I am not sure what the incremental benfit of lovenox would be compared to stopping Eliquis for 3 days prior (vs 2 days of lovenox) vs being off of all anti-coagulation for as much as a week post procedure. We have no data to tell us differently. If he wants to proceed with the spinal stimulator then proceed. There is a small stroke risk. He is in NSR".   Therefore patient may hold anticoagulation 3 days prior to procedure as long as patient accepts there is a small risk of stroke off anticoagulation.

## 2019-09-24 ENCOUNTER — Telehealth: Payer: Self-pay | Admitting: Family Medicine

## 2019-09-24 ENCOUNTER — Encounter: Payer: Self-pay | Admitting: Family Medicine

## 2019-09-24 NOTE — Telephone Encounter (Signed)
Received call from [facility] . Patient declined the [facility] Referral at this time, said he was seeing his Neurologist and would discuss this him him.

## 2019-09-24 NOTE — Telephone Encounter (Signed)
I spoke with pt; pt said that he is weak on rt side and having difficulty walking; pt did have some slurred speech while talking with me.I advised pt he needed to go to ED for eval and testing and asked pt if I needed to call (!1 for pt;  Someone took the phone and started talking; when she stopped talking I asked who I was speaking with and it was pts wife Haywood Filler said that yesterday pt had weakness in rt side; some difficulty in walking, droop on rt side of face, pt was not focusing and sleeping a lot. Pt has been off Eliquis for 3 days due to pt needing a procedure for back pain in 09/25/19.[name] did not know pt had contacted our office; [name] said pt has had 7 TIA's and she and her son who is there with them are well aware of pt condition and symptoms. [name] said pt has a spine stimulator put in at [facility] and will have stimulator rep on call to let [facility] know the stimulator is not on so pt can have procedure at [facility] on 09/25/19.  I advised pt needs to go to ED now for eval of possible stroke. [name] said that she is aware of what is going on with pt and they are going to wait until tomorrow to have procedure for back done. [name] said years ago that pt was in a lot of pain which caused pt to have a stroke and then a heart attack. I advised [name] that that could happen again and that waiting for back procedure that might not be necessary depending on pts condition prior to procedure. [name] said she and her son understand all that and I asked if she was going to take pt to ED today for eval and she said no they are going to wait  Until tomorrow so pt can have back procedure for pts pain. [name] said she appreciated my concern but they would wait until tomorrow and she thanked me for my advice. Sending note to Glenda Chroman FNP who is out of office and Dr Diona Browner who is in office.

## 2019-09-25 ENCOUNTER — Ambulatory Visit
Admission: RE | Admit: 2019-09-25 | Discharge: 2019-09-25 | Disposition: A | Discharge status: Home / Self Care | Payer: Medicare Other | Source: Ambulatory Visit | Attending: Neurological Surgery | Admitting: Neurological Surgery

## 2019-09-25 ENCOUNTER — Encounter: Payer: Self-pay | Admitting: Family Medicine

## 2019-09-25 ENCOUNTER — Other Ambulatory Visit: Payer: Self-pay

## 2019-09-25 DIAGNOSIS — M5126 Other intervertebral disc displacement, lumbar region: Secondary | ICD-10-CM

## 2019-09-25 DIAGNOSIS — M4727 Other spondylosis with radiculopathy, lumbosacral region: Secondary | ICD-10-CM | POA: Diagnosis not present

## 2019-09-25 MED ORDER — METHYLPREDNISOLONE ACETATE 40 MG/ML INJ SUSP (RADIOLOG
120.0000 mg | Freq: Once | INTRAMUSCULAR | Status: AC
  Administered 2019-09-25: 120 mg via EPIDURAL

## 2019-09-25 MED ORDER — IOPAMIDOL (ISOVUE-M 200) INJECTION 41%
1.0000 mL | Freq: Once | INTRAMUSCULAR | Status: AC
  Administered 2019-09-25: 1 mL via EPIDURAL

## 2019-09-25 NOTE — Telephone Encounter (Signed)
Spoke with patient this morning.  See other email message

## 2019-09-25 NOTE — Telephone Encounter (Signed)
Please call patient and check on him today.

## 2019-09-25 NOTE — Telephone Encounter (Signed)
Noted  

## 2019-09-25 NOTE — Telephone Encounter (Signed)
Spoke with patient, doing much better today.  Pt was seen at [place] and they did the nerve shot to help with pain - pt states that he got much relief from this.   Pt was told that coming off the Eliquis was causing his issues -- pt plans to start back on Eliquis today.  BP was good this morning, no slurred speech, no dizziness.   Pt to call if he needs anything.   Will send to [place] as FYI -see other message from this morning from the patient

## 2019-09-25 NOTE — Discharge Instructions (Signed)
Spinal Injection Discharge Instruction Sheet ° °1. You may resume a regular diet and any medications that you routinely take, including pain medications. ° °2. No driving the rest of the day of the procedure. ° °3. Light activity throughout the rest of the day.  Do not do any strenuous work, exercise, bending or lifting.  The day following the procedure, you may resume normal physical activity but you should refrain from exercising or physical therapy for at least three days. ° ° °Common Side Effects: ° °· Headaches- take your usual medications as directed by your physician.   ° °· Restlessness or inability to sleep- you may have trouble sleeping for the next few days.  Ask your referring physician if you need any medication for sleep if over the counter sleep medications do not help. ° °· Facial flushing or redness- this should subside within a few days. ° °· Increased pain- a temporary increase in pain a day or two following your procedure is not unusual.  Take your pain medication as prescribed by your referring physician.  You may use ice to the injection site as needed.  Please do not use heat for 24 hours. ° °· Leg cramps ° °Please contact our office at [phone] for the following symptoms: °· Fever greater than 100 degrees. °· Headaches unresolved with medication after 2-3 days. °· Increased swelling, pain, or redness at injection site. ° °Thank you for visiting our office. ° ° °You may resume Eliquis today. °

## 2019-10-02 DIAGNOSIS — H33012 Retinal detachment with single break, left eye: Secondary | ICD-10-CM | POA: Diagnosis not present

## 2019-10-02 DIAGNOSIS — H43811 Vitreous degeneration, right eye: Secondary | ICD-10-CM | POA: Diagnosis not present

## 2019-10-02 DIAGNOSIS — H31093 Other chorioretinal scars, bilateral: Secondary | ICD-10-CM | POA: Diagnosis not present

## 2019-10-03 DIAGNOSIS — H33012 Retinal detachment with single break, left eye: Secondary | ICD-10-CM | POA: Diagnosis not present

## 2019-10-03 DIAGNOSIS — H33022 Retinal detachment with multiple breaks, left eye: Secondary | ICD-10-CM | POA: Diagnosis not present

## 2019-10-04 ENCOUNTER — Encounter: Payer: Self-pay | Admitting: Family Medicine

## 2019-10-04 DIAGNOSIS — H33012 Retinal detachment with single break, left eye: Secondary | ICD-10-CM | POA: Diagnosis not present

## 2019-10-07 ENCOUNTER — Encounter: Payer: Self-pay | Admitting: Family Medicine

## 2019-10-08 ENCOUNTER — Ambulatory Visit: Payer: Medicare Other

## 2019-10-10 DIAGNOSIS — H33012 Retinal detachment with single break, left eye: Secondary | ICD-10-CM | POA: Diagnosis not present

## 2019-10-10 DIAGNOSIS — H31093 Other chorioretinal scars, bilateral: Secondary | ICD-10-CM | POA: Diagnosis not present

## 2019-10-28 ENCOUNTER — Telehealth: Payer: Self-pay | Admitting: Adult Health Nurse Practitioner

## 2019-10-28 ENCOUNTER — Encounter: Payer: Self-pay | Admitting: Family Medicine

## 2019-10-28 NOTE — Telephone Encounter (Signed)
Called to schedule visit.  Left VM with reason for call and call back info. Almus Woodham K. Olena Heckle NP

## 2019-10-28 NOTE — Telephone Encounter (Signed)
Patient returned phone call and just had eye surgery.  Wanted to wait till cleared by surgeon to schedule a visit.  Patient wants to call back later this week after his appointment with the surgeon.  Will call back next week if do not hear back from patient. Dola Lunsford K. Olena Heckle NP

## 2019-10-31 DIAGNOSIS — H33022 Retinal detachment with multiple breaks, left eye: Secondary | ICD-10-CM | POA: Diagnosis not present

## 2019-11-02 ENCOUNTER — Encounter: Payer: Self-pay | Admitting: Family Medicine

## 2019-11-04 ENCOUNTER — Other Ambulatory Visit: Payer: Self-pay | Admitting: Family Medicine

## 2019-11-04 ENCOUNTER — Telehealth: Payer: Self-pay

## 2019-11-04 MED ORDER — OXYCODONE-ACETAMINOPHEN 5-325 MG PO TABS: 2.0000 | ORAL_TABLET | ORAL | 0 refills | Status: DC | PRN

## 2019-11-04 NOTE — Telephone Encounter (Signed)
At the request of Amy NP, phone call placed to patient to confirm place of scheduled visit for 11/05/2019.

## 2019-11-04 NOTE — Telephone Encounter (Signed)
Amy NP had received VM from patient. Returned phone call to patient. VM left with return contact information provided.

## 2019-11-05 ENCOUNTER — Other Ambulatory Visit: Payer: Medicare Other | Admitting: Adult Health Nurse Practitioner

## 2019-11-05 ENCOUNTER — Other Ambulatory Visit: Payer: Self-pay

## 2019-11-05 DIAGNOSIS — Z515 Encounter for palliative care: Secondary | ICD-10-CM

## 2019-11-05 DIAGNOSIS — G20 Parkinson's disease: Secondary | ICD-10-CM

## 2019-11-05 NOTE — Progress Notes (Signed)
[place] Consult Note Telephone: [phone]  Fax: [phone]  PATIENT NAME: Dennis Kennon Sr. DOB: March 31, 1946 MRN: [id number]  PRIMARY CARE PROVIDER:   [name], FNP  REFERRING PROVIDER:  [name], [place],  [place] [address]  RESPONSIBLE PARTY:   Enzio Caterino, wife  [phone]       RECOMMENDATIONS and PLAN:  1.  Advanced care planning.  Patient is a DNR  2.  Pain.  Patient describes pain as being all over.  States that over the past 6 months to a year the pain has increased.  Pain score he describes as off the charts.  He has chronic low back pain in which he gets relief with the spinal stimulator.  Patient has a difficult time getting out of chair to stand and each step with walking causes pain.  He states that even sitting hurts. Has been coping with the pain by sleeping.  Wife does state that he usually does not get a good nights sleep as he does wake in pain.  His appetite has decreased and has lost from 384 pounds to about 348 pounds.  Wife states that he has pain related to his Parkinson's, osteoarthritis, rheumatoid arthritis. Tylenol gives little relief.  Has tried tramadol and gabapentin without relief.  Oxycontin 5/325 has given him relief in the past.  PCP did prescribe oxycontin 5/325 mg 2 tabs every 4 hours as needed for the pain.  Patient took 2 tabs last night and wife states that it was the best night's sleep he has had in a long time.  He states having a high pain threshold and does not like having to take opioids more than he has to.  Discussed staying ahead of the pain and not letting it get out of control.  Agreed to take 2 tabs this afternoon and then 2 tabs tonight to get ahead of the pain.  Want him to try to take 1 tab in the morning, 1 tab in the afternoon, and 2 tabs at night.  Though encouraged that if this is not enough to try taking every 6 hours a part.  Instructed family  to watch for increased drowsiness that could lead to falls and constipation.  They have Miralax on hand.  Instructed to try daily to prevent opioid induced constipation.  Have appointment in one week to evaluate effectiveness of this regimen.  3.  Itching.  Patient has lesions all over his back, arms, and abdomen believed to be related to toxic exposure while he was in the [employer].  He has been seen by dermatology in the past.  States that they itch really bad and he will scratch til they bleed at time with a back scratcher.  Have ordered triamcinolone 0.1% that he can use TID PRN for itching.  Will evaluate at our visit in one week.  Patient is having uncontrolled pain.  Will work with titrating oxycontin for effectiveness and will add fentanyl if needed. Patient having depression, decreased appetite, and functional problems related to uncontrolled pain.  Hopes are to control the pain and see if this helps with these issues before adding other medications.  Palliative will continue to monitor for symptom management  I spent 110 minutes providing this consultation,  from 1:30 to 3:20  Including time spent with patient/family, chart review, provider coordination, documentation. More than 50% of the time in this consultation was spent coordinating communication.   HISTORY  OF PRESENT ILLNESS:  Dennis Nakama Mossbarger Sr. is a 74 y.o. year old male with multiple medical problems including Parkinson's disease, CHF, Addison's disease, CVA, MI, sleep apnea, hypertension, hyperlipidemia, hypothyroidism, gout, history of testicular cancer and prostate cancer. Palliative Care was asked to help address goals of care.   CODE STATUS: DNR  PPS: 40% HOSPICE ELIGIBILITY/DIAGNOSIS: TBD  PHYSICAL EXAM:  HR 90 O2 97% on RA General: NAD, frail appearing Cardiovascular: regular rate and rhythm Pulmonary: lung sounds clear; normal respiratory effort Abdomen: soft, nontender, + bowel sounds GU: no suprapubic  tenderness Extremities: no edema, no joint deformities Skin: has multiple lesions over back due to toxic exposure (being seen by dermatology) Neurological: Weakness but otherwise nonfocal   PAST MEDICAL HISTORY:  Past Medical History:  Diagnosis Date   Addison's disease ([place]) 01/2016   Arthritis    low back - DDD   Cataract 2019   corrected with surgery   Cellulitis, scrotum 08/02/2014   Chronic diastolic CHF (congestive heart failure) ([place])    Echo 12/18: severe LVH, EF 60-65, Gr 1 DD // Echo 5/18: EF 65-70, Gr 1 DD   Chronic lower back pain    "from [place] [id number]"   Complication of anesthesia    Sometimes has N&V /w anesth.    Coronary artery disease    NSTEMI >> LHC 9/01: prox and mid LAD 50-70; mRCA 40 >> med Rx // Nuc 8/13 [facility]):  no infarct or ischemia, EF 59   Elevated PSA    Epididymitis, left 08/04/2014   History of chronic bronchitis    History of gout    History of stroke 01/22/2016   2004 - "right brain stem; no residual " // 2017   Hypertension    Hypocholesteremia    Hypothyroidism    Infection of skin due to methicillin resistant Staphylococcus aureus (MRSA) 12/28/2017   Kidney stone    Myocardial infarction [facility]) 2001   2001- cardiac cath., cardiac clearanece note dr Otho Perl 05-14-13 on chart, stress test results 02-21-12 on chart   OSA on CPAP    cpap setting of 10   Parkinson's disease ([place])    stage 4    Pneumonia 2000's and 2013   PONV (postoperative nausea and vomiting)    [facility] spotted fever    Testicular cancer ([place]) 2015    SOCIAL HX:  Social History   Tobacco Use   Smoking status: Never Smoker   Smokeless tobacco: Never Used  Substance Use Topics   Alcohol use: Not Currently    ALLERGIES:  Allergies  Allergen Reactions   Bee Venom Anaphylaxis   Shrimp [Shellfish Allergy] Anaphylaxis and Other (See Comments)    "just the protein in the shrimp"   Stadol [Butorphanol] Anaphylaxis, Shortness Of Breath and  Other (See Comments)    Respiratory distress, couldn't breathe, cardiac arrest   Wasp Venom Anaphylaxis     PERTINENT MEDICATIONS:  Outpatient Encounter Medications as of 11/05/2019  Medication Sig   allopurinol (ZYLOPRIM) 300 MG tablet Take 450 mg by mouth daily.    apixaban (ELIQUIS) 5 MG TABS tablet Take 1 tablet (5 mg total) by mouth 2 (two) times daily.   carbidopa-levodopa (SINEMET IR) 25-250 MG tablet Take 2 tablets by mouth 4 (four) times daily. (Patient taking differently: Take 2 tablets by mouth See admin instructions. Take 2 tablets by mouth four times daily- 8 AM, 12 PM (noon), 4 PM, and 8 PM)   colchicine 0.6 MG tablet TAKE 1 TABLET BY MOUTH  TWICE DAILY (Patient taking differently: Take 0.6 mg by mouth 2 (two) times daily. )   dorzolamide-timolol (COSOPT) 22.3-6.8 MG/ML ophthalmic solution Place 1 drop into the left eye 2 (two) times daily.    DULoxetine (CYMBALTA) 60 MG capsule Take 1 capsule (60 mg total) by mouth daily.   EPINEPHrine 0.3 mg/0.3 mL IJ SOAJ injection Inject 0.3 mLs (0.3 mg total) into the muscle as needed (allergic reaction).   furosemide (LASIX) 40 MG tablet Take 1 tablet (40 mg total) by mouth daily.   hydrocortisone (CORTEF) 10 MG tablet TAKE 2 TABLETS BY MOUTH EVERY MORNING AND 1 TABLET AT 5 PM EVERY DAY (Patient taking differently: Take 10-20 mg by mouth See admin instructions. Take 20 mg by mouth in the morning and 10 mg at 5 PM daily)   levothyroxine (SYNTHROID) 137 MCG tablet TAKE 2 TABLETS BY MOUTH DAILY BEFORE BREAKFAST (Patient taking differently: Take 137-274 mcg by mouth See admin instructions. Take 274 mcg by mouth in the morning and 137 mcg at 5 PM daily)   Multiple Vitamin (MULTIVITAMIN WITH MINERALS) TABS tablet Take 1 tablet by mouth daily.   oxyCODONE-acetaminophen (PERCOCET/ROXICET) 5-325 MG tablet Take 2 tablets by mouth every 4 (four) hours as needed for severe pain.   potassium chloride (K-DUR) 10 MEQ tablet Take 1 tablet (10 mEq  total) by mouth daily.   rosuvastatin (CRESTOR) 20 MG tablet Take 1 tablet (20 mg total) by mouth daily.   testosterone cypionate (DEPO-TESTOSTERONE) 200 MG/ML injection Inject 0.6 mLs (120 mg total) into the muscle every 14 (fourteen) days.   topiramate (TOPAMAX) 50 MG tablet Take 1 tablet (50 mg total) by mouth 2 (two) times daily.   No facility-administered encounter medications on file as of 11/05/2019.     Gerhardt Gleed Jenetta Downer, NP

## 2019-11-11 ENCOUNTER — Other Ambulatory Visit: Payer: Self-pay

## 2019-11-11 ENCOUNTER — Other Ambulatory Visit: Payer: Medicare Other | Admitting: Adult Health Nurse Practitioner

## 2019-11-11 DIAGNOSIS — G20 Parkinson's disease: Secondary | ICD-10-CM | POA: Diagnosis not present

## 2019-11-11 DIAGNOSIS — Z515 Encounter for palliative care: Secondary | ICD-10-CM

## 2019-11-11 NOTE — Progress Notes (Signed)
[place] Consult Note Telephone: [phone]  Fax: [phone]  PATIENT NAME: Dennis Sibbald Sr. DOB: 12/15/1945 MRN: [id number]  PRIMARY CARE PROVIDER:   [name], FNP  REFERRING PROVIDER:  [name], [place],  [place] [address]  RESPONSIBLE PARTY:   Karina Zeisloft, wife  [phone]        RECOMMENDATIONS and PLAN:  1.  Advanced care planning.  Patient is a DNR  2. Pain. Patient has been taking oxycontin 5/325 mg 2 tabs every 4-6 hours and has been getting good pain relief.  States that he was have some drowsiness at first but feels like this has abated.  States that he has been constipated and have encouraged Miralax daily until gets relief and then adjusting to see if he needs it daily or every other day to achieve regular BMs.  States that he is feeling like now that his pain is better controlled that he would like to start doing neurotherapy again.  After having retinal surgery have discussed getting approval from his eye surgeon before restarting therapy.  He has weakness related to decreased functional status related to pain.  Now that his pain is better controlled therapy would be of benefit.  Refilled  His oxycontin 5/325 mg 2 tabs every 4 hours as needed for pain today.  Looked up patient on [employer] substance abuse data base and he has an ORS of 350.    3.  Itching.  Patient states that the triamcinolone has relieved the itching.  Will continue with triamcinolone as needed.  4.  Appetite.  Patient is having decreased appetite.  He states having no appetite.  Patient is on Cymbalta for depression.  Discussed remeron for depression and appetite stimulant.  Will reach out to PCP with recommendation but may not be able to take due to interactions with his medications.  Palliative will continue to monitor for symptom management/decline and make recommendations as needed. Next visit is in 8 weeks.   Encouraged to call with any concerns and when need a refill on oxycontin.  I spent 90 minutes providing this consultation,  from 1:30 to 3:00 Including time spent with patient/family, chart review, provider coordination, documentation. More than 50% of the time in this consultation was spent coordinating communication.   HISTORY OF PRESENT ILLNESS:  Dennis Franzoni Kassing Sr. is a 74 y.o. year old male with multiple medical problems including Parkinson's disease, CHF, Addison's disease, CVA, MI, sleep apnea, hypertension, hyperlipidemia, hypothyroidism, gout, history of testicular cancer and prostate cancer. Palliative Care was asked to help address goals of care.   CODE STATUS: DNR  PPS: 40% HOSPICE ELIGIBILITY/DIAGNOSIS: TBD  PHYSICAL EXAM:   General: NAD, frail appearing Extremities: no edema, no joint deformities Neurological: Weakness but otherwise nonfocal  PAST MEDICAL HISTORY:  Past Medical History:  Diagnosis Date  . Addison's disease ([place]) 01/2016  . Arthritis    low back - DDD  . Cataract 2019   corrected with surgery  . Cellulitis, scrotum 08/02/2014  . Chronic diastolic CHF (congestive heart failure) ([place])    Echo 12/18: severe LVH, EF 60-65, Gr 1 DD // Echo 5/18: EF 65-70, Gr 1 DD  . Chronic lower back pain    "from [place] 2007"  . Complication of anesthesia    Sometimes has N&V /w anesth.   . Coronary artery disease    NSTEMI >> LHC 9/01: prox and mid LAD 50-70; mRCA 40 >>  med Rx // Nuc 8/13 [facility]):  no infarct or ischemia, EF 59  . Elevated PSA   . Epididymitis, left 08/04/2014  . History of chronic bronchitis   . History of gout   . History of stroke 01/22/2016   2004 - "right brain stem; no residual " // 2017  . Hypertension   . Hypocholesteremia   . Hypothyroidism   . Infection of skin due to methicillin resistant Staphylococcus aureus (MRSA) 12/28/2017  . Kidney stone   . Myocardial infarction [facility]) 2001   2001- cardiac cath., cardiac clearanece note dr Otho Perl  05-14-13 on chart, stress test results 02-21-12 on chart  . OSA on CPAP    cpap setting of 10  . Parkinson's disease ([place])    stage 4   . Pneumonia 2000's and 2013  . PONV (postoperative nausea and vomiting)   . [facility] spotted fever   . Testicular cancer ([place]) 2015    SOCIAL HX:  Social History   Tobacco Use  . Smoking status: Never Smoker  . Smokeless tobacco: Never Used  Substance Use Topics  . Alcohol use: Not Currently    ALLERGIES:  Allergies  Allergen Reactions  . Bee Venom Anaphylaxis  . Shrimp [Shellfish Allergy] Anaphylaxis and Other (See Comments)    "just the protein in the shrimp"  . Stadol [Butorphanol] Anaphylaxis, Shortness Of Breath and Other (See Comments)    Respiratory distress, couldn't breathe, cardiac arrest  . Wasp Venom Anaphylaxis     PERTINENT MEDICATIONS:  Outpatient Encounter Medications as of 11/11/2019  Medication Sig  . allopurinol (ZYLOPRIM) 300 MG tablet Take 450 mg by mouth daily.   Marland Kitchen apixaban (ELIQUIS) 5 MG TABS tablet Take 1 tablet (5 mg total) by mouth 2 (two) times daily.  . carbidopa-levodopa (SINEMET IR) 25-250 MG tablet Take 2 tablets by mouth 4 (four) times daily. (Patient taking differently: Take 2 tablets by mouth See admin instructions. Take 2 tablets by mouth four times daily- 8 AM, 12 PM (noon), 4 PM, and 8 PM)  . colchicine 0.6 MG tablet TAKE 1 TABLET BY MOUTH TWICE DAILY (Patient taking differently: Take 0.6 mg by mouth 2 (two) times daily. )  . dorzolamide-timolol (COSOPT) 22.3-6.8 MG/ML ophthalmic solution Place 1 drop into the left eye 2 (two) times daily.   . DULoxetine (CYMBALTA) 60 MG capsule Take 1 capsule (60 mg total) by mouth daily.  Marland Kitchen EPINEPHrine 0.3 mg/0.3 mL IJ SOAJ injection Inject 0.3 mLs (0.3 mg total) into the muscle as needed (allergic reaction).  . furosemide (LASIX) 40 MG tablet Take 1 tablet (40 mg total) by mouth daily.  . hydrocortisone (CORTEF) 10 MG tablet TAKE 2 TABLETS BY MOUTH EVERY MORNING AND 1  TABLET AT 5 PM EVERY DAY (Patient taking differently: Take 10-20 mg by mouth See admin instructions. Take 20 mg by mouth in the morning and 10 mg at 5 PM daily)  . levothyroxine (SYNTHROID) 137 MCG tablet TAKE 2 TABLETS BY MOUTH DAILY BEFORE BREAKFAST (Patient taking differently: Take 137-274 mcg by mouth See admin instructions. Take 274 mcg by mouth in the morning and 137 mcg at 5 PM daily)  . Multiple Vitamin (MULTIVITAMIN WITH MINERALS) TABS tablet Take 1 tablet by mouth daily.  Marland Kitchen oxyCODONE-acetaminophen (PERCOCET/ROXICET) 5-325 MG tablet Take 2 tablets by mouth every 4 (four) hours as needed for severe pain.  . potassium chloride (K-DUR) 10 MEQ tablet Take 1 tablet (10 mEq total) by mouth daily.  . rosuvastatin (CRESTOR) 20 MG tablet Take  1 tablet (20 mg total) by mouth daily.  Marland Kitchen testosterone cypionate (DEPO-TESTOSTERONE) 200 MG/ML injection Inject 0.6 mLs (120 mg total) into the muscle every 14 (fourteen) days.  Marland Kitchen topiramate (TOPAMAX) 50 MG tablet Take 1 tablet (50 mg total) by mouth 2 (two) times daily.   No facility-administered encounter medications on file as of 11/11/2019.     Aldrich Lloyd Jenetta Downer, NP

## 2019-11-18 ENCOUNTER — Ambulatory Visit (INDEPENDENT_AMBULATORY_CARE_PROVIDER_SITE_OTHER): Payer: Medicare Other | Admitting: Family Medicine

## 2019-11-18 ENCOUNTER — Other Ambulatory Visit: Payer: Self-pay

## 2019-11-18 ENCOUNTER — Encounter: Payer: Self-pay | Admitting: Family Medicine

## 2019-11-18 VITALS — BP 128/86 | HR 104 | Temp 98.2°F | Wt 343.0 lb

## 2019-11-18 DIAGNOSIS — R634 Abnormal weight loss: Secondary | ICD-10-CM

## 2019-11-18 DIAGNOSIS — M5441 Lumbago with sciatica, right side: Secondary | ICD-10-CM

## 2019-11-18 DIAGNOSIS — N3090 Cystitis, unspecified without hematuria: Secondary | ICD-10-CM | POA: Diagnosis not present

## 2019-11-18 DIAGNOSIS — G8929 Other chronic pain: Secondary | ICD-10-CM

## 2019-11-18 DIAGNOSIS — I6340 Cerebral infarction due to embolism of unspecified cerebral artery: Secondary | ICD-10-CM | POA: Diagnosis not present

## 2019-11-18 DIAGNOSIS — R531 Weakness: Secondary | ICD-10-CM

## 2019-11-18 DIAGNOSIS — R3915 Urgency of urination: Secondary | ICD-10-CM

## 2019-11-18 DIAGNOSIS — M5442 Lumbago with sciatica, left side: Secondary | ICD-10-CM | POA: Diagnosis not present

## 2019-11-18 LAB — POCT URINALYSIS DIPSTICK
Glucose, UA: NEGATIVE
Nitrite, UA: POSITIVE
Protein, UA: POSITIVE — AB
Spec Grav, UA: 1.025 (ref 1.010–1.025)
Urobilinogen, UA: 1 E.U./dL
pH, UA: 6 (ref 5.0–8.0)

## 2019-11-18 MED ORDER — SULFAMETHOXAZOLE-TRIMETHOPRIM 800-160 MG PO TABS: 1.0000 | ORAL_TABLET | Freq: Two times a day (BID) | ORAL | 0 refills | Status: AC

## 2019-11-18 NOTE — Patient Instructions (Addendum)
Premier Protein Shakes  Try lavender soap, if not better with groin irritation, can try bar soap with no dyes/ perfumes  I will get in touch with Amy and brainstorm for pain management

## 2019-11-18 NOTE — Progress Notes (Signed)
Subjective:    Patient ID: Dennis Fountain Sr., male    DOB: 1945/12/09, 74 y.o.   MRN: [id number]  HPI Chief Complaint  Patient presents with  . Urinary Tract Infection    x 3-4 days. Pt c/o urinary urgency and incontinence with bladder pressure. Denies fever. Hard to tell if really having pain given chronic back pain    This is a 74 yo male, accompanied by his wife, who presents today with above cc. No fever or abdominal pain/ nausea/ vomiting.   Decreased appetite- eating small amounts, infrequently. Coffee in am, water (42 ounces). BM every couple of days. Denies early satiety, just not interested in food.   Pain- taking percocet 5-325 mg, 2 tablets every 4 hours, pain is in legs, hips, not spasm, pain is constant, grabbing. No relief with position change. Has been having to lie on side due to retinal detachment. Can get brief relief with lying on back. Can not do any PT until cleared by eye. Appointment 12/03/17. Has gotten progressively weaker. Barely walking around his home. Difficult to get in shower even with assistance from his wife and son.   Rash, irritation in bilateral groin folds- using cream and powder from Hanley Ben, NP with some relief. Burning with peppermint castile soap and wash cloth.   [facility] at [facility] at 11 am  Neurology appointment 12/11/19. Worsening tremor.   Review of Systems Per HPI    Objective:   Physical Exam Vitals reviewed.  Constitutional:      General: He is not in acute distress.    Appearance: He is obese. He is ill-appearing. He is not toxic-appearing or diaphoretic.     Comments: Sitting in electric scooter. Appears chronically ill.   HENT:     Head: Normocephalic and atraumatic.  Eyes:     Conjunctiva/sclera: Conjunctivae normal.  Cardiovascular:     Rate and Rhythm: Normal rate and regular rhythm.     Heart sounds: Normal heart sounds.  Pulmonary:     Effort: Pulmonary effort is normal.     Breath sounds: Normal breath sounds.   Abdominal:     General: There is no distension.     Tenderness: There is no abdominal tenderness. There is no right CVA tenderness, left CVA tenderness, guarding or rebound.     Hernia: No hernia is present.  Musculoskeletal:     Right lower leg: Edema (trace) present.     Left lower leg: Edema (trace) present.  Skin:    General: Skin is warm and dry.     Comments: Multiple skin lesions on all visible skin, raised, patchy, flesh colored area.  Chronic venous stasis changes of bilateral lower extremities.   Neurological:     Mental Status: He is alert and oriented to person, place, and time.  Psychiatric:        Attention and Perception: Attention normal.        Mood and Affect: Mood is depressed.        Speech: Speech normal.        Behavior: Behavior normal.        Thought Content: Thought content normal.        Cognition and Memory: Cognition normal.        Judgment: Judgment normal.       BP 128/86 (BP Location: Left Arm, Patient Position: Sitting, Cuff Size: Large)   Pulse (!) 104   Temp 98.2 F (36.8 C) (Temporal)   Wt (!) 343 lb (  155.6 kg)   SpO2 95%   BMI 40.67 kg/m  Wt Readings from Last 3 Encounters:  11/18/19 (!) 343 lb (155.6 kg)  09/25/19 (!) 378 lb (171.5 kg)  09/13/19 (!) 384 lb (174.2 kg)   Results for orders placed or performed in visit on 11/18/19  Urinalysis Dipstick  Result Value Ref Range   Color, UA orange    Clarity, UA cloudy    Glucose, UA Negative Negative   Bilirubin, UA 1+    Ketones, UA +-    Spec Grav, UA 1.025 1.010 - 1.025   Blood, UA 3+    pH, UA 6.0 5.0 - 8.0   Protein, UA Positive (A) Negative   Urobilinogen, UA 1.0 0.2 or 1.0 E.U./dL   Nitrite, UA pos    Leukocytes, UA Moderate (2+) (A) Negative   Appearance     Odor         Assessment & Plan:  1. Urinary urgency - Urinalysis Dipstick  2. Cystitis - Urine Culture; Future - sulfamethoxazole-trimethoprim (BACTRIM DS) 800-160 MG tablet; Take 1 tablet by mouth 2 (two)  times daily for 7 days.  Dispense: 14 tablet; Refill: 0 - Urine Culture - Follow up precautions reviewed   3. Weakness - unfortunately, unable to do PT due to recent detached retina. Has to get clearance from optho prior to starting neuro PT.   4. Weight loss - decreased appetite, ? Related to depression, discussed increasing protein intake - offered nutrition consult, declined at this time  5. Chronic bilateral low back pain with bilateral sciatica - some improvement in back pain with current pain regiment. Having other pain that seems neuropathic in nature.   - Unsure about next steps with him, will touch base with palliative care NP to see if she has ideas  This visit occurred during the SARS-CoV-2 public health emergency.  Safety protocols were in place, including screening questions prior to the visit, additional usage of staff PPE, and extensive cleaning of exam room while observing appropriate contact time as indicated for disinfecting solutions.   Clarene Reamer, FNP-BC  [facility]  11/18/2019 5:41 PM

## 2019-11-20 LAB — URINE CULTURE
MICRO NUMBER:: 10458675
SPECIMEN QUALITY:: ADEQUATE

## 2019-11-22 MED ORDER — CEPHALEXIN 500 MG PO CAPS: 500.0000 mg | ORAL_CAPSULE | Freq: Two times a day (BID) | ORAL | 0 refills | Status: AC

## 2019-11-22 NOTE — Addendum Note (Signed)
Addended by: Clarene Reamer B on: 11/22/2019 07:55 AM   Modules accepted: Orders

## 2019-11-25 ENCOUNTER — Encounter: Payer: Self-pay | Admitting: Family Medicine

## 2019-11-26 NOTE — Telephone Encounter (Signed)
Received FMLA ppw through fax. Placed on cart.

## 2019-11-27 ENCOUNTER — Telehealth: Payer: Self-pay

## 2019-11-27 NOTE — Telephone Encounter (Signed)
Volunteer support call, message left

## 2019-11-29 DIAGNOSIS — H33022 Retinal detachment with multiple breaks, left eye: Secondary | ICD-10-CM | POA: Diagnosis not present

## 2019-12-17 DIAGNOSIS — M961 Postlaminectomy syndrome, not elsewhere classified: Secondary | ICD-10-CM | POA: Diagnosis not present

## 2019-12-17 DIAGNOSIS — Z9689 Presence of other specified functional implants: Secondary | ICD-10-CM | POA: Diagnosis not present

## 2020-01-01 ENCOUNTER — Other Ambulatory Visit: Payer: Self-pay

## 2020-01-01 ENCOUNTER — Other Ambulatory Visit: Payer: Self-pay | Admitting: Endocrinology

## 2020-01-01 ENCOUNTER — Telehealth: Payer: Self-pay | Admitting: Endocrinology

## 2020-01-01 DIAGNOSIS — E230 Hypopituitarism: Secondary | ICD-10-CM

## 2020-01-01 DIAGNOSIS — E039 Hypothyroidism, unspecified: Secondary | ICD-10-CM

## 2020-01-01 MED ORDER — TESTOSTERONE CYPIONATE 200 MG/ML IM SOLN: 120.0000 mg | INTRAMUSCULAR | 0 refills | Status: DC

## 2020-01-01 MED ORDER — LEVOTHYROXINE SODIUM 137 MCG PO TABS: ORAL_TABLET | ORAL | 0 refills | Status: DC

## 2020-01-01 MED ORDER — HYDROCORTISONE 10 MG PO TABS: ORAL_TABLET | ORAL | 0 refills | Status: DC

## 2020-01-01 NOTE — Telephone Encounter (Signed)
Rx sent 

## 2020-01-01 NOTE — Telephone Encounter (Signed)
Medication Refill Request  Did you call your pharmacy and request this refill first? Yes-PHARM told patient they have been sending request for over a week. Patient states he has been out of Levothyroxine for approx. 1 week. Patient requests the following RX's be sent asap.  If patient has not contacted pharmacy first, instruct them to do so for future refills.   Remind them that contacting the pharmacy for their refill is the quickest method to get the refill.   Refill policy also stated that it will take anywhere between 24-72 hours to receive the refill.    Name of medication? levothyroxine (SYNTHROID) 137 MCG tablet And testosterone cypionate (DEPO-TESTOSTERONE) 200 MG/ML injection AND hydrocortisone (CORTEF) 10 MG tablet  Is this a 90 day supply? Normally yes  Name and location of pharmacy?  [id number] [address], [place] AT [address] Phone:  [phone]  Fax:  [phone]       Is the request for diabetes test strips? No  If yes, what brand? N/A

## 2020-01-01 NOTE — Telephone Encounter (Signed)
Rx for Levothyroxine and Hydrocortisone have been sent in 30 day supply, which will sustain pt until his next appt. Please refill testosterone if appropriate.

## 2020-01-02 DIAGNOSIS — R259 Unspecified abnormal involuntary movements: Secondary | ICD-10-CM | POA: Insufficient documentation

## 2020-01-07 ENCOUNTER — Other Ambulatory Visit: Payer: Medicare Other | Admitting: Adult Health Nurse Practitioner

## 2020-01-07 ENCOUNTER — Other Ambulatory Visit: Payer: Self-pay

## 2020-01-07 DIAGNOSIS — G20 Parkinson's disease: Secondary | ICD-10-CM

## 2020-01-07 DIAGNOSIS — Z515 Encounter for palliative care: Secondary | ICD-10-CM

## 2020-01-07 NOTE — Progress Notes (Signed)
[facility] Consult Note Telephone: [phone]  Fax: [phone]  PATIENT NAME: Dennis Erven Sr. DOB: 01-06-1946 MRN: [id number]  PRIMARY CARE PROVIDER:   [name], FNP  REFERRING PROVIDER:  [name], [place],  [place] [address]  RESPONSIBLE PARTY:   Avrian Delfavero, wife [phone]     RECOMMENDATIONS and PLAN:  1.  Advanced care planning.  Patient is DNR.  2.  Pain.  Patient has oxycontin 5/325 mg 2 tabs every 4-6 hours.  He gets pain relief when he takes it.  Patient still has quite a few pills left after it was filled 57 days ago.  He has not been taking it consistently and wife and son state that he will have really bad pain and at times will take 3 or 4 tabs to get the pain under control.  Today getting up out of his chair and walking he is in pain.  Have encouraged that he take 2 tabs every 6 hours to stay ahead of the pain.  Will change this to oxycodone/APAP 10/325 mg to reduce the amount of acetaminophen he takes. Will call in a few days to see if he is getting better pain relief taking it more regularly  3.  Appetite.  Patient has decreased appetite.  He eats about 50% of a meal and maybe only one meal a day.  Does drink a premier protein drink daily.  Drinks plenty of water.  Last weight in Epic is 340 pounds with BMI 40.3 on 12/11/19.  On 09/13/19 he weighed 384 pounds with BMI of 45.54.  He states that his clothes feel looser.    4.  Depression.  Patient denies depression.  Family states that he sleeps a lot and is easily irritable. He is on duloxetine 60 mg daily.  Family states he does not get good relief with this but has limited options due to being on carbidopa/levidopa.  Had discussion about depression and inadequate pain relief.  He agrees to try to stay ahead of his pain. Hopes are that better pain relief will help improve depression, sleep, and appetite.    5.  Functional status.   Patient does have an electric wheelchair for when leaving home. Ambulates today by holding onto walls and furniture.  Family does state that he has to rest 30-60 minutes after a shower as it wears him out. He requires minimal assistance with ADLs. He does have some SOB with activity but more gets tired with activity.  Still has not been cleared by eye doctor for therapy.  Does have appointment on 01/10/20 with his eye doctor.    Denies cough, fever, N/V/D, headaches, dizziness, chest pain.  Will call in a few days to see how he is doing keeping his pain better managed. May take awhile to see benefits of better pain control on depression.  Did discuss hospice if he continues to have decreased appetite and weight loss.  He did not like talking about hospice and states that he will do better to control the pain.  Palliative will continue to monitor for symptom management/decline and make recommendations as needed.  Next appointment is in 4 weeks.  He does have upcoming endocrinology and rheumatology appointments with lab work at end of July.    I spent 80 minutes providing this consultation,  from 1:30 to 2:50 including time spent with patient/family, chart review, provider coordination, documentation. More than 50% of the time  in this consultation was spent coordinating communication.   HISTORY OF PRESENT ILLNESS:  Dennis Kuster Minihan Sr. is a 74 y.o. year old male with multiple medical problems including Parkinson's disease, CHF, Addison's disease, CVA, MI, sleep apnea, hypertension, hyperlipidemia, hypothyroidism, gout, history of testicular cancer and prostate cancer. Palliative Care was asked to help address goals of care.   CODE STATUS: DNR  PPS: 40% HOSPICE ELIGIBILITY/DIAGNOSIS: TBD  PHYSICAL EXAM:  BP  148/95  HR 65  O2 99% on RA General: NAD, frail appearing Cardiovascular: regular rate and rhythm Pulmonary: lung sounds clear; normal respiratory effort Abdomen: soft, nontender, + bowel  sounds GU: no suprapubic tenderness Extremities: trace to 1+ edema, no joint deformities Skin: no rashes on exposed skin Neurological: Weakness but otherwise nonfocal  PAST MEDICAL HISTORY:  Past Medical History:  Diagnosis Date  . Addison's disease ([facility]) 01/2016  . Arthritis    low back - DDD  . Cataract 2019   corrected with surgery  . Cellulitis, scrotum 08/02/2014  . Chronic diastolic CHF (congestive heart failure) ([place])    Echo 12/18: severe LVH, EF 60-65, Gr 1 DD // Echo 5/18: EF 65-70, Gr 1 DD  . Chronic lower back pain    "from [place] 2007"  . Complication of anesthesia    Sometimes has N&V /w anesth.   . Coronary artery disease    NSTEMI >> LHC 9/01: prox and mid LAD 50-70; mRCA 40 >> med Rx // Nuc 8/13 [facility]):  no infarct or ischemia, EF 59  . Elevated PSA   . Epididymitis, left 08/04/2014  . History of chronic bronchitis   . History of gout   . History of stroke 01/22/2016   2004 - "right brain stem; no residual " // 2017  . Hypertension   . Hypocholesteremia   . Hypothyroidism   . Infection of skin due to methicillin resistant Staphylococcus aureus (MRSA) 12/28/2017  . Kidney stone   . Myocardial infarction [facility]) 2001   2001- cardiac cath., cardiac clearanece note dr Otho Perl 05-14-13 on chart, stress test results 02-21-12 on chart  . OSA on CPAP    cpap setting of 10  . Parkinson's disease ([place])    stage 4   . Pneumonia 2000's and 2013  . PONV (postoperative nausea and vomiting)   . [facility] spotted fever   . Testicular cancer ([place]) 2015    SOCIAL HX:  Social History   Tobacco Use  . Smoking status: Never Smoker  . Smokeless tobacco: Never Used  Substance Use Topics  . Alcohol use: Not Currently    ALLERGIES:  Allergies  Allergen Reactions  . Bee Venom Anaphylaxis  . Shrimp [Shellfish Allergy] Anaphylaxis and Other (See Comments)    "just the protein in the shrimp"  . Stadol [Butorphanol] Anaphylaxis, Shortness Of Breath and Other (See Comments)     Respiratory distress, couldn't breathe, cardiac arrest  . Wasp Venom Anaphylaxis     PERTINENT MEDICATIONS:  Outpatient Encounter Medications as of 01/07/2020  Medication Sig  . allopurinol (ZYLOPRIM) 300 MG tablet Take 450 mg by mouth daily.   Marland Kitchen apixaban (ELIQUIS) 5 MG TABS tablet Take 1 tablet (5 mg total) by mouth 2 (two) times daily.  . carbidopa-levodopa (SINEMET IR) 25-250 MG tablet Take 2 tablets by mouth 4 (four) times daily. (Patient taking differently: Take 2 tablets by mouth See admin instructions. Take 2 tablets by mouth four times daily- 8 AM, 12 PM (noon), 4 PM, and 8 PM)  . colchicine  0.6 MG tablet TAKE 1 TABLET BY MOUTH TWICE DAILY (Patient taking differently: Take 0.6 mg by mouth 2 (two) times daily. )  . dorzolamide-timolol (COSOPT) 22.3-6.8 MG/ML ophthalmic solution Place 1 drop into the left eye 2 (two) times daily.   . DULoxetine (CYMBALTA) 60 MG capsule Take 1 capsule (60 mg total) by mouth daily.  Marland Kitchen EPINEPHrine 0.3 mg/0.3 mL IJ SOAJ injection Inject 0.3 mLs (0.3 mg total) into the muscle as needed (allergic reaction).  . furosemide (LASIX) 40 MG tablet Take 1 tablet (40 mg total) by mouth daily.  . hydrocortisone (CORTEF) 10 MG tablet TAKE 2 TABLETS BY MOUTH EVERY MORNING AND 1 TABLET AT 5 PM EVERY DAY  . levothyroxine (SYNTHROID) 137 MCG tablet Take 1.5 tablets by mouth Monday through Friday and 1 tablet on Saturday and Sunday.  . Multiple Vitamin (MULTIVITAMIN WITH MINERALS) TABS tablet Take 1 tablet by mouth daily.  Marland Kitchen oxyCODONE-acetaminophen (PERCOCET/ROXICET) 5-325 MG tablet Take 2 tablets by mouth every 4 (four) hours as needed for severe pain.  . potassium chloride (K-DUR) 10 MEQ tablet Take 1 tablet (10 mEq total) by mouth daily.  . rosuvastatin (CRESTOR) 20 MG tablet Take 1 tablet (20 mg total) by mouth daily.  Marland Kitchen testosterone cypionate (DEPO-TESTOSTERONE) 200 MG/ML injection Inject 0.6 mLs (120 mg total) into the muscle every 14 (fourteen) days.  Marland Kitchen topiramate  (TOPAMAX) 50 MG tablet Take 1 tablet (50 mg total) by mouth 2 (two) times daily.   No facility-administered encounter medications on file as of 01/07/2020.      Nashae Maudlin Jenetta Downer, NP

## 2020-01-08 ENCOUNTER — Ambulatory Visit: Payer: Medicare Other | Admitting: Endocrinology

## 2020-01-17 ENCOUNTER — Other Ambulatory Visit: Payer: Self-pay

## 2020-01-17 ENCOUNTER — Other Ambulatory Visit: Payer: Medicare Other | Admitting: Adult Health Nurse Practitioner

## 2020-01-17 DIAGNOSIS — G20 Parkinson's disease: Secondary | ICD-10-CM

## 2020-01-17 DIAGNOSIS — Z515 Encounter for palliative care: Secondary | ICD-10-CM

## 2020-01-18 NOTE — Progress Notes (Signed)
[facility] Consult Note Telephone: [phone]  Fax: [phone]  PATIENT NAME: Dennis Albro Sr. DOB: 12/02/1945 MRN: [id number]  PRIMARY CARE PROVIDER:   [name], FNP  REFERRING PROVIDER:  [name], [place],  [place] [address]  RESPONSIBLE PARTY:     Due to the COVID-19 crisis, this telephone evaluation and treatment contact was done via telephone and it was initiated and consent by this patient and or family.     RECOMMENDATIONS and PLAN:  1.  Advanced care planning.  Patient is DNR.  2.  Pain.  Patient calling for refill in oxycodone/APAP prescription.  Checked [employer] substance abuse website.  Changed from oxycodone/APAP 5/325 mg take 2 tabs every 4 hours PRN for pain to oxycodone/APAP 10/325 mg 1 tab every 4 hours PRN for pain.  Patient does get relief with this regimen.  Patient tends not to stay ahead of the pain and will wait until the pain gets severe before taking anything.  Have encouraged him to take the pain medicine every 4-6 hours to stay ahead of the pain.  Have spoke with the wife as patient can be forgetful with change from the 5/325 mg to the 10/325 mg and to only take 1 tab at a time.  Have sent the prescription to the pharmacy on [address] in [place].  Wife still has concerns that he is not eating much.  Have appointment in 3 weeks and will further discuss this.  I spent 20 minutes providing this consultation,  From 2:00  to 2:20 including time spent with patient/family, chart review, provider coordination, documentation. More than 50% of the time in this consultation was spent coordinating communication.   HISTORY OF PRESENT ILLNESS:  Dennis Mainwaring Amaral Sr. is a 74 y.o. year old male with multiple medical problems including Parkinson's disease, CHF, Addison's disease, CVA, MI, sleep apnea, hypertension, hyperlipidemia, hypothyroidism, gout, history of testicular cancer and  prostate cancer. Palliative Care was asked to help address goals of care.   CODE STATUS: DNR  PPS: 40% HOSPICE ELIGIBILITY/DIAGNOSIS: TBD  PHYSICAL EXAM:   Deferred  PAST MEDICAL HISTORY:  Past Medical History:  Diagnosis Date  . Addison's disease ([place]) 01/2016  . Arthritis    low back - DDD  . Cataract 2019   corrected with surgery  . Cellulitis, scrotum 08/02/2014  . Chronic diastolic CHF (congestive heart failure) ([place])    Echo 12/18: severe LVH, EF 60-65, Gr 1 DD // Echo 5/18: EF 65-70, Gr 1 DD  . Chronic lower back pain    "from [place] 2007"  . Complication of anesthesia    Sometimes has N&V /w anesth.   . Coronary artery disease    NSTEMI >> LHC 9/01: prox and mid LAD 50-70; mRCA 40 >> med Rx // Nuc 8/13 [facility]):  no infarct or ischemia, EF 59  . Elevated PSA   . Epididymitis, left 08/04/2014  . History of chronic bronchitis   . History of gout   . History of stroke 01/22/2016   2004 - "right brain stem; no residual " // 2017  . Hypertension   . Hypocholesteremia   . Hypothyroidism   . Infection of skin due to methicillin resistant Staphylococcus aureus (MRSA) 12/28/2017  . Kidney stone   . Myocardial infarction [facility]) 2001   2001- cardiac cath., cardiac clearanece note dr Otho Perl 05-14-13 on chart, stress test results 02-21-12 on chart  . OSA on  CPAP    cpap setting of 10  . Parkinson's disease ([place])    stage 4   . Pneumonia 2000's and 2013  . PONV (postoperative nausea and vomiting)   . [facility] spotted fever   . Testicular cancer ([place]) 2015    SOCIAL HX:  Social History   Tobacco Use  . Smoking status: Never Smoker  . Smokeless tobacco: Never Used  Substance Use Topics  . Alcohol use: Not Currently    ALLERGIES:  Allergies  Allergen Reactions  . Bee Venom Anaphylaxis  . Shrimp [Shellfish Allergy] Anaphylaxis and Other (See Comments)    "just the protein in the shrimp"  . Stadol [Butorphanol] Anaphylaxis, Shortness Of Breath and Other (See  Comments)    Respiratory distress, couldn't breathe, cardiac arrest  . Wasp Venom Anaphylaxis     PERTINENT MEDICATIONS:  Outpatient Encounter Medications as of 01/17/2020  Medication Sig  . allopurinol (ZYLOPRIM) 300 MG tablet Take 450 mg by mouth daily.   Marland Kitchen apixaban (ELIQUIS) 5 MG TABS tablet Take 1 tablet (5 mg total) by mouth 2 (two) times daily.  . carbidopa-levodopa (SINEMET IR) 25-250 MG tablet Take 2 tablets by mouth 4 (four) times daily. (Patient taking differently: Take 2 tablets by mouth See admin instructions. Take 2 tablets by mouth four times daily- 8 AM, 12 PM (noon), 4 PM, and 8 PM)  . colchicine 0.6 MG tablet TAKE 1 TABLET BY MOUTH TWICE DAILY (Patient taking differently: Take 0.6 mg by mouth 2 (two) times daily. )  . dorzolamide-timolol (COSOPT) 22.3-6.8 MG/ML ophthalmic solution Place 1 drop into the left eye 2 (two) times daily.   . DULoxetine (CYMBALTA) 60 MG capsule Take 1 capsule (60 mg total) by mouth daily.  Marland Kitchen EPINEPHrine 0.3 mg/0.3 mL IJ SOAJ injection Inject 0.3 mLs (0.3 mg total) into the muscle as needed (allergic reaction).  . furosemide (LASIX) 40 MG tablet Take 1 tablet (40 mg total) by mouth daily.  . hydrocortisone (CORTEF) 10 MG tablet TAKE 2 TABLETS BY MOUTH EVERY MORNING AND 1 TABLET AT 5 PM EVERY DAY  . levothyroxine (SYNTHROID) 137 MCG tablet Take 1.5 tablets by mouth Monday through Friday and 1 tablet on Saturday and Sunday.  . Multiple Vitamin (MULTIVITAMIN WITH MINERALS) TABS tablet Take 1 tablet by mouth daily.  Marland Kitchen oxyCODONE-acetaminophen (PERCOCET/ROXICET) 5-325 MG tablet Take 2 tablets by mouth every 4 (four) hours as needed for severe pain.  . potassium chloride (K-DUR) 10 MEQ tablet Take 1 tablet (10 mEq total) by mouth daily.  . rosuvastatin (CRESTOR) 20 MG tablet Take 1 tablet (20 mg total) by mouth daily.  Marland Kitchen testosterone cypionate (DEPO-TESTOSTERONE) 200 MG/ML injection Inject 0.6 mLs (120 mg total) into the muscle every 14 (fourteen) days.  Marland Kitchen  topiramate (TOPAMAX) 50 MG tablet Take 1 tablet (50 mg total) by mouth 2 (two) times daily.   No facility-administered encounter medications on file as of 01/17/2020.      Angie Piercey Jenetta Downer, NP

## 2020-01-28 ENCOUNTER — Other Ambulatory Visit: Payer: Self-pay | Admitting: Family Medicine

## 2020-01-28 MED ORDER — DULOXETINE HCL 60 MG PO CPEP: 60.0000 mg | ORAL_CAPSULE | Freq: Every day | ORAL | 3 refills | Status: AC

## 2020-01-28 NOTE — Telephone Encounter (Signed)
Fax received from [facility]([place]) for Duloxetine 60mg   Last refilled 01/02/2019 #90 x 3 refills.   Please advise, thanks.

## 2020-01-30 ENCOUNTER — Other Ambulatory Visit: Payer: Medicare Other | Admitting: Adult Health Nurse Practitioner

## 2020-01-30 ENCOUNTER — Other Ambulatory Visit: Payer: Self-pay

## 2020-01-30 ENCOUNTER — Telehealth: Payer: Self-pay | Admitting: Adult Health Nurse Practitioner

## 2020-01-30 DIAGNOSIS — G20 Parkinson's disease: Secondary | ICD-10-CM

## 2020-01-30 DIAGNOSIS — Z515 Encounter for palliative care: Secondary | ICD-10-CM | POA: Diagnosis not present

## 2020-01-30 DIAGNOSIS — G20C Parkinsonism, unspecified: Secondary | ICD-10-CM

## 2020-01-30 NOTE — Progress Notes (Signed)
[facility] Consult Note Telephone: [phone]  Fax: [phone]  PATIENT NAME: Dennis Migliaccio Sr. DOB: July 11, 1946 MRN: [id number]  PRIMARY CARE PROVIDER:   [name], FNP  REFERRING PROVIDER:  [name], [place],  [place] [address]  RESPONSIBLE PARTY:   Murel Shenberger, wife [phone]    RECOMMENDATIONS and PLAN:  1.  Advanced care planning.  Patient is DNR.  2. Pain. Patient on oxycodone 5/325 mg 1 tab every 4 hours as needed for pain. Patient states that he gets relief as long as he continues to keep up with it every 4 hours. States that his pain goes down to a 3 out of 10 when he takes the oxycodone. Wife does have concerns that his pain tolerance is high and that him may not be giving [name] an accurate depiction of his pain. Today he he is in pain when he walks but it has been 4-1/2 hours since his last dose of oxycodone. We will continue current dose of oxycodone as some of his pain may be related to his uncontrolled depression, see below  3. Depression. Patient patient endorses depression. He has denied being depressed in the past. Discussed sleep and appetite disturbances along with increased pain that depression can cause. Patient would like to try something to see if we can get his depression under control. We will reach out to PCP as options are limited with his Parkinson's medications.  4. Functional status. Patient is able to ambulate with walker or while holding onto furniture. Denies falls. Wife and son state they are having to assist him more with walking to make sure he does not fall. Is able to perform ADLs with minimal assistance. Patient did have an episode last night in which going over to see his father-in-law that he was so worn out that he collapsed while eating. Family was able to get him into bed. After a while he was able to get up and return to his own home. Wife does have  concerns that he may be getting to a point where she and their son may need to consider placement into facility.  5. Support. Wife is also taking care of her father who just got out of the hospital and lives across the street. She is becoming overwhelmed and is concerned about decline in her husband and her ability to continue taking care of him. We will continue to assess and will have social work referral when needed. Wife inquiring about ramp for the home. Will reach out to Education officer, museum for any resources. Wife gave provider email and will provide any resources through email.  Patient does state that he feels lightheaded most days. He sleeps 9 to 15 hours a day. Does have occasional constipation and have continue to endorse daily MiraLAX while taking oxycodone. Denies increased shortness of breath or cough, nausea, vomiting, dysuria, hematuria, fever, chest pain. Palliative will continue to monitor for symptom management/decline and make recommendations as needed. Next appointment in 2 weeks.  I spent 90 minutes providing this consultation,  from 11:00 to 12:30 including time spent with patient/family, chart review, provider coordination, documentation. More than 50% of the time in this consultation was spent coordinating communication.   HISTORY OF PRESENT ILLNESS:  Dennis Debes Barron Sr. is a 74 y.o. year old male with multiple medical problems including Parkinson's disease, CHF, Addison's disease, CVA, MI, sleep apnea, hypertension, hyperlipidemia, hypothyroidism, gout, history of testicular cancer  and prostate cancer. Palliative Care was asked to help address goals of care.   CODE STATUS: DNR  PPS: 40% HOSPICE ELIGIBILITY/DIAGNOSIS: TBD  PHYSICAL EXAM:   General: NAD, frail appearing Extremities: trace to 1+ edema, no joint deformities Skin: no rashes on exposed skin Neurological: Weakness but otherwise nonfocal  PAST MEDICAL HISTORY:  Past Medical History:  Diagnosis Date  . Addison's  disease ([place]) 01/2016  . Arthritis    low back - DDD  . Cataract 2019   corrected with surgery  . Cellulitis, scrotum 08/02/2014  . Chronic diastolic CHF (congestive heart failure) ([place])    Echo 12/18: severe LVH, EF 60-65, Gr 1 DD // Echo 5/18: EF 65-70, Gr 1 DD  . Chronic lower back pain    "from [place] 2007"  . Complication of anesthesia    Sometimes has N&V /w anesth.   . Coronary artery disease    NSTEMI >> LHC 9/01: prox and mid LAD 50-70; mRCA 40 >> med Rx // Nuc 8/13 [facility]):  no infarct or ischemia, EF 59  . Elevated PSA   . Epididymitis, left 08/04/2014  . History of chronic bronchitis   . History of gout   . History of stroke 01/22/2016   2004 - "right brain stem; no residual " // 2017  . Hypertension   . Hypocholesteremia   . Hypothyroidism   . Infection of skin due to methicillin resistant Staphylococcus aureus (MRSA) 12/28/2017  . Kidney stone   . Myocardial infarction [facility]) 2001   2001- cardiac cath., cardiac clearanece note dr Otho Perl 05-14-13 on chart, stress test results 02-21-12 on chart  . OSA on CPAP    cpap setting of 10  . Parkinson's disease ([place])    stage 4   . Pneumonia 2000's and 2013  . PONV (postoperative nausea and vomiting)   . [facility] spotted fever   . Testicular cancer ([place]) 2015    SOCIAL HX:  Social History   Tobacco Use  . Smoking status: Never Smoker  . Smokeless tobacco: Never Used  Substance Use Topics  . Alcohol use: Not Currently    ALLERGIES:  Allergies  Allergen Reactions  . Bee Venom Anaphylaxis  . Shrimp [Shellfish Allergy] Anaphylaxis and Other (See Comments)    "just the protein in the shrimp"  . Stadol [Butorphanol] Anaphylaxis, Shortness Of Breath and Other (See Comments)    Respiratory distress, couldn't breathe, cardiac arrest  . Wasp Venom Anaphylaxis     PERTINENT MEDICATIONS:  Outpatient Encounter Medications as of 01/30/2020  Medication Sig  . allopurinol (ZYLOPRIM) 300 MG tablet Take 450 mg by mouth daily.    Marland Kitchen apixaban (ELIQUIS) 5 MG TABS tablet Take 1 tablet (5 mg total) by mouth 2 (two) times daily.  . carbidopa-levodopa (SINEMET IR) 25-250 MG tablet Take 2 tablets by mouth 4 (four) times daily. (Patient taking differently: Take 2 tablets by mouth See admin instructions. Take 2 tablets by mouth four times daily- 8 AM, 12 PM (noon), 4 PM, and 8 PM)  . colchicine 0.6 MG tablet TAKE 1 TABLET BY MOUTH TWICE DAILY (Patient taking differently: Take 0.6 mg by mouth 2 (two) times daily. )  . dorzolamide-timolol (COSOPT) 22.3-6.8 MG/ML ophthalmic solution Place 1 drop into the left eye 2 (two) times daily.   . DULoxetine (CYMBALTA) 60 MG capsule Take 1 capsule (60 mg total) by mouth daily.  Marland Kitchen EPINEPHrine 0.3 mg/0.3 mL IJ SOAJ injection Inject 0.3 mLs (0.3 mg total) into the muscle as needed (  allergic reaction).  . furosemide (LASIX) 40 MG tablet Take 1 tablet (40 mg total) by mouth daily.  . hydrocortisone (CORTEF) 10 MG tablet TAKE 2 TABLETS BY MOUTH EVERY MORNING AND 1 TABLET AT 5 PM EVERY DAY  . levothyroxine (SYNTHROID) 137 MCG tablet Take 1.5 tablets by mouth Monday through Friday and 1 tablet on Saturday and Sunday.  . Multiple Vitamin (MULTIVITAMIN WITH MINERALS) TABS tablet Take 1 tablet by mouth daily.  Marland Kitchen oxyCODONE-acetaminophen (PERCOCET/ROXICET) 5-325 MG tablet Take 2 tablets by mouth every 4 (four) hours as needed for severe pain.  . potassium chloride (K-DUR) 10 MEQ tablet Take 1 tablet (10 mEq total) by mouth daily.  . rosuvastatin (CRESTOR) 20 MG tablet Take 1 tablet (20 mg total) by mouth daily.  Marland Kitchen testosterone cypionate (DEPO-TESTOSTERONE) 200 MG/ML injection Inject 0.6 mLs (120 mg total) into the muscle every 14 (fourteen) days.  Marland Kitchen topiramate (TOPAMAX) 50 MG tablet Take 1 tablet (50 mg total) by mouth 2 (two) times daily.   No facility-administered encounter medications on file as of 01/30/2020.     Heberto Sturdevant Jenetta Downer, NP

## 2020-01-30 NOTE — Telephone Encounter (Signed)
This is late entry.  Spoke with wife last night when she called with concerns that he was declining.  He is only eating bites.  Is keeping pain better controlled.  Is very weak.  Have scheduled appointment for 12:30 today. Loana Salvaggio K. Olena Heckle NP

## 2020-01-31 ENCOUNTER — Other Ambulatory Visit (INDEPENDENT_AMBULATORY_CARE_PROVIDER_SITE_OTHER): Payer: Medicare Other

## 2020-01-31 ENCOUNTER — Ambulatory Visit: Payer: Medicare Other

## 2020-01-31 ENCOUNTER — Other Ambulatory Visit: Payer: Self-pay

## 2020-01-31 ENCOUNTER — Other Ambulatory Visit: Payer: Self-pay | Admitting: Endocrinology

## 2020-01-31 DIAGNOSIS — E230 Hypopituitarism: Secondary | ICD-10-CM | POA: Diagnosis not present

## 2020-01-31 DIAGNOSIS — M1A9XX1 Chronic gout, unspecified, with tophus (tophi): Secondary | ICD-10-CM

## 2020-01-31 DIAGNOSIS — E876 Hypokalemia: Secondary | ICD-10-CM

## 2020-01-31 DIAGNOSIS — E039 Hypothyroidism, unspecified: Secondary | ICD-10-CM

## 2020-01-31 LAB — COMPREHENSIVE METABOLIC PANEL
ALT: 17 U/L (ref 0–53)
AST: 14 U/L (ref 0–37)
Albumin: 4.1 g/dL (ref 3.5–5.2)
Alkaline Phosphatase: 52 U/L (ref 39–117)
BUN: 16 mg/dL (ref 6–23)
CO2: 30 mEq/L (ref 19–32)
Calcium: 9.6 mg/dL (ref 8.4–10.5)
Chloride: 102 mEq/L (ref 96–112)
Creatinine, Ser: 1.18 mg/dL (ref 0.40–1.50)
GFR: 60.29 mL/min (ref >60.00)
Glucose, Bld: 112 mg/dL — ABNORMAL HIGH (ref 70–99)
Potassium: 3.8 mEq/L (ref 3.5–5.1)
Sodium: 138 mEq/L (ref 135–145)
Total Bilirubin: 1 mg/dL (ref 0.2–1.2)
Total Protein: 7.2 g/dL (ref 6.0–8.3)

## 2020-01-31 LAB — CBC
HCT: 42.6 % (ref 39.0–52.0)
Hemoglobin: 14.5 g/dL (ref 13.0–17.0)
MCHC: 34 g/dL (ref 30.0–36.0)
MCV: 87.3 fl (ref 78.0–100.0)
Platelets: 143 10*3/uL — ABNORMAL LOW (ref 150.0–400.0)
RBC: 4.88 Mil/uL (ref 4.22–5.81)
RDW: 14.6 % (ref 11.5–15.5)
WBC: 5.2 10*3/uL (ref 4.0–10.5)

## 2020-01-31 LAB — TSH: TSH: 6.57 u[IU]/mL — ABNORMAL HIGH (ref 0.35–4.50)

## 2020-01-31 LAB — TESTOSTERONE: Testosterone: 102.51 ng/dL — ABNORMAL LOW (ref 300.00–890.00)

## 2020-01-31 LAB — T4, FREE: Free T4: 0.99 ng/dL (ref 0.60–1.60)

## 2020-01-31 LAB — URIC ACID: Uric Acid, Serum: 5.8 mg/dL (ref 4.0–7.8)

## 2020-02-03 ENCOUNTER — Telehealth: Payer: Self-pay | Admitting: Adult Health Nurse Practitioner

## 2020-02-03 DIAGNOSIS — M1A09X1 Idiopathic chronic gout, multiple sites, with tophus (tophi): Secondary | ICD-10-CM | POA: Diagnosis not present

## 2020-02-03 DIAGNOSIS — M150 Primary generalized (osteo)arthritis: Secondary | ICD-10-CM | POA: Diagnosis not present

## 2020-02-03 DIAGNOSIS — M2550 Pain in unspecified joint: Secondary | ICD-10-CM | POA: Diagnosis not present

## 2020-02-03 NOTE — Telephone Encounter (Signed)
Opened in error °Mazi Schuff K. Daniel Johndrow NP °

## 2020-02-03 NOTE — Telephone Encounter (Signed)
Spoke with wife to get update on patient.  Discussed conversation with my supervising physician that his symptoms may be related to his Addison's disease.  Patient has had blood work drawn on 01/31/20 by endocrinology and he has virtual appt this Wednesday with his endocrinologist.  Wife also states that he has another panel of blood work scheduled to be drawn.  She has not heard back from PCP about recommendation for remeron and has left message in MyChart to call me. Will update once hear back from PCP.   Ashton Sabine K. Olena Heckle NP

## 2020-02-05 ENCOUNTER — Telehealth (INDEPENDENT_AMBULATORY_CARE_PROVIDER_SITE_OTHER): Payer: Medicare Other | Admitting: Endocrinology

## 2020-02-05 ENCOUNTER — Other Ambulatory Visit: Payer: Self-pay

## 2020-02-05 DIAGNOSIS — I6340 Cerebral infarction due to embolism of unspecified cerebral artery: Secondary | ICD-10-CM

## 2020-02-05 DIAGNOSIS — E230 Hypopituitarism: Secondary | ICD-10-CM

## 2020-02-05 DIAGNOSIS — E039 Hypothyroidism, unspecified: Secondary | ICD-10-CM
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
## 2020-02-08 ENCOUNTER — Other Ambulatory Visit: Payer: Self-pay | Admitting: Endocrinology

## 2020-02-11 ENCOUNTER — Other Ambulatory Visit: Payer: Medicare Other | Admitting: Adult Health Nurse Practitioner

## 2020-02-11 ENCOUNTER — Other Ambulatory Visit: Payer: Self-pay

## 2020-02-11 DIAGNOSIS — G20 Parkinson's disease: Secondary | ICD-10-CM

## 2020-02-11 DIAGNOSIS — Z515 Encounter for palliative care: Secondary | ICD-10-CM

## 2020-02-11 NOTE — Progress Notes (Signed)
[place] Consult Note Telephone: [phone]  Fax: [phone]  PATIENT NAME: Dennis Dismuke Sr. DOB: 25-Sep-1945 MRN: [id number]  PRIMARY CARE PROVIDER:   [name], FNP  REFERRING PROVIDER:  [name], [place],  [place] [address]  RESPONSIBLE PARTY:   Lando Alcalde, wife [phone]   RECOMMENDATIONS and PLAN: 1.Advanced care planning. Patient is DNR.  2.  Pain.  Patient currently taking oxycodone/APAP 10/325 mg 1 tab every 4 hours as needed for pain.  Patient states that he gets some relief of his pain with this regimen as long as he keeps up with taking 1 tab every 4 hours.  Does state that when he takes 2 tabs at bedtime he sleeps better.  We will add Xtampza ER 18 mg twice daily and continue the as needed oxycodone/APAP 10/325 mg 1 tab every 4 hours as needed for pain.  Hopefully this will decrease pill burden.  Have encouraged patient to keep a log of how often he is taking the as needed oxycodone and to monitor how long the [facility] ER lasts before pain sets in again.  Have appointment in 2 weeks to evaluate pain and make adjustments as needed to his pain medication  3.  Depression.  Patient is concerned that he is becoming increasingly irritable and will feel angry.  Family does state that he has been found screaming at the dogs and also endorses increased irritability.  Due to interactions with his Parkinson's medications will confirm with his PCP and neurologist for best management of his depression.  Have suggested Remeron previously but this looks like it has a drug interaction with levodopa.  I have looked up and looks like SNRIs may have fewer interactions with Parkinson's medications.  He is currently on Cymbalta 60 mg daily.  4.  Parkinson's.  Patient is having tremors in bilateral hands and arms today.  States that he is having more freezing episodes.  Patient also states that he  is hot and clammy all the time.  Patient is currently on Sinemet 25/250 mg 2 tabs 4 times daily.  Last saw a neurologist on June 2. We will reach out to PCP and neurology with these concerns  5. Functional status. Patient is able to ambulate with walker or while holding onto furniture. Denies falls. Wife and son have to assist him more with walking to make sure he does not fall. Able to perform ADLs with minimal assistance. Patient does endorse that he takes mostly sink baths because he is afraid of falling in the shower. Does require help from his family to take a shower. Have encouraged family to get help either from family, friends, church family, or through an agency. Have left list of resources with family. Have encouraged them to reach out to [facility] to see if they are able to provide any help for the family.  Patient's appetite continues to be decreased. No weight loss noted. Patient does continue to sleep up to 15 hours a day. Has occasional constipation and takes MiraLAX for this. Denies increased shortness of breath or cough, nausea, vomiting, dysuria, hematuria, fever, chest pain. Palliative will continue to monitor for symptom management/decline and make recommendations as needed. Next appointment in 2 weeks  I spent 90 minutes providing this consultation,  from 10:30 to 12:00 including time spent with patient/family, chart review, provider coordination, documentation. More than 50% of the time in this consultation was spent coordinating communication.  HISTORY OF PRESENT ILLNESS:  Dennis Achille Hoar Sr. is a 74 y.o. year old male with multiple medical problems including Parkinson's disease, CHF, Addison's disease, CVA, MI, sleep apnea, hypertension, hyperlipidemia, hypothyroidism, gout, history of testicular cancer and prostate cancer. Palliative Care was asked to help address goals of care.   CODE STATUS: DNR  PPS: 40% HOSPICE ELIGIBILITY/DIAGNOSIS: TBD  PHYSICAL EXAM:    General: NAD, frail appearing Cardiovascular: regular rate and rhythm Pulmonary: lung sounds clear; normal respiratory effort Abdomen: soft, nontender, + bowel sounds Extremities: 1-2+ edema to bilateral feet, ankles, and lower legs with more noted to right foot, no joint deformities Skin: no rashes on exposed skin Neurological: Weakness but otherwise nonfocal   PAST MEDICAL HISTORY:  Past Medical History:  Diagnosis Date  . Addison's disease ([place]) 01/2016  . Arthritis    low back - DDD  . Cataract 2019   corrected with surgery  . Cellulitis, scrotum 08/02/2014  . Chronic diastolic CHF (congestive heart failure) ([place])    Echo 12/18: severe LVH, EF 60-65, Gr 1 DD // Echo 5/18: EF 65-70, Gr 1 DD  . Chronic lower back pain    "from [place] 2007"  . Complication of anesthesia    Sometimes has N&V /w anesth.   . Coronary artery disease    NSTEMI >> LHC 9/01: prox and mid LAD 50-70; mRCA 40 >> med Rx // Nuc 8/13 [facility]):  no infarct or ischemia, EF 59  . Elevated PSA   . Epididymitis, left 08/04/2014  . History of chronic bronchitis   . History of gout   . History of stroke 01/22/2016   2004 - "right brain stem; no residual " // 2017  . Hypertension   . Hypocholesteremia   . Hypothyroidism   . Infection of skin due to methicillin resistant Staphylococcus aureus (MRSA) 12/28/2017  . Kidney stone   . Myocardial infarction [facility]) 2001   2001- cardiac cath., cardiac clearanece note dr Otho Perl 05-14-13 on chart, stress test results 02-21-12 on chart  . OSA on CPAP    cpap setting of 10  . Parkinson's disease ([place])    stage 4   . Pneumonia 2000's and 2013  . PONV (postoperative nausea and vomiting)   . [facility] spotted fever   . Testicular cancer ([place]) 2015    SOCIAL HX:  Social History   Tobacco Use  . Smoking status: Never Smoker  . Smokeless tobacco: Never Used  Substance Use Topics  . Alcohol use: Not Currently    ALLERGIES:  Allergies  Allergen Reactions  . Bee Venom  Anaphylaxis  . Shrimp [Shellfish Allergy] Anaphylaxis and Other (See Comments)    "just the protein in the shrimp"  . Stadol [Butorphanol] Anaphylaxis, Shortness Of Breath and Other (See Comments)    Respiratory distress, couldn't breathe, cardiac arrest  . Wasp Venom Anaphylaxis     PERTINENT MEDICATIONS:  Outpatient Encounter Medications as of 02/11/2020  Medication Sig  . allopurinol (ZYLOPRIM) 300 MG tablet Take 450 mg by mouth daily.   Marland Kitchen apixaban (ELIQUIS) 5 MG TABS tablet Take 1 tablet (5 mg total) by mouth 2 (two) times daily.  . carbidopa-levodopa (SINEMET IR) 25-250 MG tablet Take 2 tablets by mouth 4 (four) times daily. (Patient taking differently: Take 2 tablets by mouth See admin instructions. Take 2 tablets by mouth four times daily- 8 AM, 12 PM (noon), 4 PM, and 8 PM)  . colchicine 0.6 MG tablet TAKE 1 TABLET BY MOUTH TWICE DAILY (Patient  taking differently: Take 0.6 mg by mouth 2 (two) times daily. )  . dorzolamide-timolol (COSOPT) 22.3-6.8 MG/ML ophthalmic solution Place 1 drop into the left eye 2 (two) times daily.   . DULoxetine (CYMBALTA) 60 MG capsule Take 1 capsule (60 mg total) by mouth daily.  Marland Kitchen EPINEPHrine 0.3 mg/0.3 mL IJ SOAJ injection Inject 0.3 mLs (0.3 mg total) into the muscle as needed (allergic reaction).  . furosemide (LASIX) 40 MG tablet Take 1 tablet (40 mg total) by mouth daily.  . hydrocortisone (CORTEF) 10 MG tablet TAKE 2 TABLETS BY MOUTH EVERY MORNING AND 1 TABLET AT 5 PM EVERY DAY  . levothyroxine (SYNTHROID) 137 MCG tablet TAKE 2 tablets daily  before breakfast and 3 tablets on SUNDAY  . Multiple Vitamin (MULTIVITAMIN WITH MINERALS) TABS tablet Take 1 tablet by mouth daily.  Marland Kitchen oxyCODONE-acetaminophen (PERCOCET) 10-325 MG tablet Take 1 tablet by mouth every 4 (four) hours as needed for pain.  . potassium chloride (K-DUR) 10 MEQ tablet Take 1 tablet (10 mEq total) by mouth daily.  . rosuvastatin (CRESTOR) 20 MG tablet Take 1 tablet (20 mg total) by mouth  daily.  Marland Kitchen testosterone cypionate (DEPO-TESTOSTERONE) 200 MG/ML injection Inject 0.6 mLs (120 mg total) into the muscle every 14 (fourteen) days.  Marland Kitchen topiramate (TOPAMAX) 50 MG tablet Take 1 tablet (50 mg total) by mouth 2 (two) times daily.   No facility-administered encounter medications on file as of 02/11/2020.     Tyler Robidoux Jenetta Downer, NP

## 2020-02-12 DIAGNOSIS — M961 Postlaminectomy syndrome, not elsewhere classified: Secondary | ICD-10-CM | POA: Diagnosis not present

## 2020-02-12 DIAGNOSIS — M1711 Unilateral primary osteoarthritis, right knee: Secondary | ICD-10-CM | POA: Diagnosis not present

## 2020-02-12 DIAGNOSIS — Z6841 Body Mass Index (BMI) 40.0 and over, adult: Secondary | ICD-10-CM | POA: Diagnosis not present

## 2020-02-12 DIAGNOSIS — M5416 Radiculopathy, lumbar region: Secondary | ICD-10-CM | POA: Diagnosis not present

## 2020-02-13 DIAGNOSIS — Z48810 Encounter for surgical aftercare following surgery on the sense organs: Secondary | ICD-10-CM | POA: Diagnosis not present

## 2020-02-13 DIAGNOSIS — T85398A Other mechanical complication of other ocular prosthetic devices, implants and grafts, initial encounter: Secondary | ICD-10-CM | POA: Diagnosis not present

## 2020-02-13 DIAGNOSIS — Z8669 Personal history of other diseases of the nervous system and sense organs: Secondary | ICD-10-CM | POA: Diagnosis not present

## 2020-02-14 DIAGNOSIS — H4312 Vitreous hemorrhage, left eye: Secondary | ICD-10-CM | POA: Diagnosis not present

## 2020-02-21 DIAGNOSIS — H43811 Vitreous degeneration, right eye: Secondary | ICD-10-CM | POA: Diagnosis not present

## 2020-02-23 ENCOUNTER — Encounter: Payer: Self-pay | Admitting: Family Medicine

## 2020-02-25 ENCOUNTER — Other Ambulatory Visit: Payer: Self-pay

## 2020-02-25 ENCOUNTER — Other Ambulatory Visit: Payer: Medicare Other | Admitting: Adult Health Nurse Practitioner

## 2020-02-25 DIAGNOSIS — Z515 Encounter for palliative care: Secondary | ICD-10-CM | POA: Diagnosis not present

## 2020-02-25 DIAGNOSIS — G20 Parkinson's disease: Secondary | ICD-10-CM

## 2020-02-25 NOTE — Progress Notes (Signed)
[facility] Consult Note Telephone: [phone]  Fax: [phone]  PATIENT NAME: Dennis Petite Sr. DOB: Jul 29, 1945 MRN: [id number]  PRIMARY CARE PROVIDER:   [name], FNP  REFERRING PROVIDER:  [name], [place],  [place] [address]  RESPONSIBLE PARTY:   Riddik Senna, wife [phone]   RECOMMENDATIONS and PLAN: 1.Advanced care planning. Patient is DNR.  2.  Pain.  Patient is getting better pain relief with Xtampza ER 18 mg twice daily.  Has been taking his oxycodone 10/325 mg mg 1-2 times a day as needed for breakthrough pain.  Does state that he has been having hot flashes and unsure is the [facility] ER is causing it or other disease processes that he is going through such as Addison's, low testosterone, adjustments in his thyroid medication.  He and family have noticed that he is more focused on activities such as [employer] work that he does work for now that his pain is better managed.  Patient was having increased right knee pain and did receive an injection last week at Ortho for this.  States that it is that it is feeling better.  States that he usually gets yearly injections in that knee.  Continue current pain regimen.  Encouraged to call when time for refill.  3.  Depression.  Still early with the better pain management but patient states that he has not noticed any difference in his depression.  Have reached out to PCP with recommendations for either increasing the Cymbalta or adding Effexor which seemed to have fewer side effects with Parkinson's medications.  She has responded and well coordinate with Dr. Anna Genre, neurology, to see what is the best management for his depression.  4.  Nutritional status.  Patient still has poor appetite.  Some days will be better than other days but mostly just eats bites at his meals.  Does supplement with Ensure or boost.  Patient weight is staying  stable with no weight loss reported.  Current weight is 385 pounds.  Patient has not had any recent falls, infections, or hospital visits.  Denies increased shortness of breath or cough, N/V/D, dysuria, hematuria.  Does have occasional constipation and takes laxatives as needed.  Does state that has less constipation since starting the [facility] ER.  Palliative will continue to monitor for symptom management/decline and make recommendations as needed.  Next appointment is in 6 weeks.  I spent 80 minutes providing this consultation,  from 10:00 to 11:20 including time spent with patient/family, chart review, provider coordination, documentation. More than 50% of the time in this consultation was spent coordinating communication.   HISTORY OF PRESENT ILLNESS:  Dennis Petit Tesar Sr. is a 74 y.o. year old male with multiple medical problems including Parkinson's disease, CHF, Addison's disease, CVA, MI, sleep apnea, hypertension, hyperlipidemia, hypothyroidism, gout, history of testicular cancer and prostate cancer. Palliative Care was asked to help address goals of care.   CODE STATUS: DNR  PPS: 40% HOSPICE ELIGIBILITY/DIAGNOSIS: TBD  PHYSICAL EXAM:   General: NAD, frail appearing Extremities: 1-2+ edema to bilateral feet, ankles, and lower legs with more noted to right foot though seems less today than usual, no joint deformities Skin: no rashes on exposed skin Neurological: Weakness but otherwise nonfocal  PAST MEDICAL HISTORY:  Past Medical History:  Diagnosis Date  . Addison's disease ([place]) 01/2016  . Arthritis    low back - DDD  . Cataract 2019   corrected  with surgery  . Cellulitis, scrotum 08/02/2014  . Chronic diastolic CHF (congestive heart failure) ([place])    Echo 12/18: severe LVH, EF 60-65, Gr 1 DD // Echo 5/18: EF 65-70, Gr 1 DD  . Chronic lower back pain    "from [place] 2007"  . Complication of anesthesia    Sometimes has N&V /w anesth.   . Coronary artery disease    NSTEMI >>  LHC 9/01: prox and mid LAD 50-70; mRCA 40 >> med Rx // Nuc 8/13 [facility]):  no infarct or ischemia, EF 59  . Elevated PSA   . Epididymitis, left 08/04/2014  . History of chronic bronchitis   . History of gout   . History of stroke 01/22/2016   2004 - "right brain stem; no residual " // 2017  . Hypertension   . Hypocholesteremia   . Hypothyroidism   . Infection of skin due to methicillin resistant Staphylococcus aureus (MRSA) 12/28/2017  . Kidney stone   . Myocardial infarction [facility]) 2001   2001- cardiac cath., cardiac clearanece note dr Otho Perl 05-14-13 on chart, stress test results 02-21-12 on chart  . OSA on CPAP    cpap setting of 10  . Parkinson's disease ([place])    stage 4   . Pneumonia 2000's and 2013  . PONV (postoperative nausea and vomiting)   . [facility] spotted fever   . Testicular cancer ([place]) 2015    SOCIAL HX:  Social History   Tobacco Use  . Smoking status: Never Smoker  . Smokeless tobacco: Never Used  Substance Use Topics  . Alcohol use: Not Currently    ALLERGIES:  Allergies  Allergen Reactions  . Bee Venom Anaphylaxis  . Shrimp [Shellfish Allergy] Anaphylaxis and Other (See Comments)    "just the protein in the shrimp"  . Stadol [Butorphanol] Anaphylaxis, Shortness Of Breath and Other (See Comments)    Respiratory distress, couldn't breathe, cardiac arrest  . Wasp Venom Anaphylaxis     PERTINENT MEDICATIONS:  Outpatient Encounter Medications as of 02/25/2020  Medication Sig  . allopurinol (ZYLOPRIM) 300 MG tablet Take 450 mg by mouth daily.   Marland Kitchen apixaban (ELIQUIS) 5 MG TABS tablet Take 1 tablet (5 mg total) by mouth 2 (two) times daily.  . carbidopa-levodopa (SINEMET IR) 25-250 MG tablet Take 2 tablets by mouth 4 (four) times daily. (Patient taking differently: Take 2 tablets by mouth See admin instructions. Take 2 tablets by mouth four times daily- 8 AM, 12 PM (noon), 4 PM, and 8 PM)  . colchicine 0.6 MG tablet TAKE 1 TABLET BY MOUTH TWICE DAILY (Patient  taking differently: Take 0.6 mg by mouth 2 (two) times daily. )  . dorzolamide-timolol (COSOPT) 22.3-6.8 MG/ML ophthalmic solution Place 1 drop into the left eye 2 (two) times daily.   . DULoxetine (CYMBALTA) 60 MG capsule Take 1 capsule (60 mg total) by mouth daily.  Marland Kitchen EPINEPHrine 0.3 mg/0.3 mL IJ SOAJ injection Inject 0.3 mLs (0.3 mg total) into the muscle as needed (allergic reaction).  . furosemide (LASIX) 40 MG tablet Take 1 tablet (40 mg total) by mouth daily.  . hydrocortisone (CORTEF) 10 MG tablet TAKE 2 TABLETS BY MOUTH EVERY MORNING AND 1 TABLET AT 5 PM EVERY DAY  . levothyroxine (SYNTHROID) 137 MCG tablet TAKE 2 tablets daily  before breakfast and 3 tablets on SUNDAY  . Multiple Vitamin (MULTIVITAMIN WITH MINERALS) TABS tablet Take 1 tablet by mouth daily.  Marland Kitchen oxyCODONE-acetaminophen (PERCOCET) 10-325 MG tablet Take 1 tablet by mouth  every 4 (four) hours as needed for pain.  . potassium chloride (K-DUR) 10 MEQ tablet Take 1 tablet (10 mEq total) by mouth daily.  . rosuvastatin (CRESTOR) 20 MG tablet Take 1 tablet (20 mg total) by mouth daily.  Marland Kitchen testosterone cypionate (DEPO-TESTOSTERONE) 200 MG/ML injection Inject 0.6 mLs (120 mg total) into the muscle every 14 (fourteen) days.  Marland Kitchen topiramate (TOPAMAX) 50 MG tablet Take 1 tablet (50 mg total) by mouth 2 (two) times daily.   No facility-administered encounter medications on file as of 02/25/2020.     Alija Riano Jenetta Downer, NP

## 2020-02-27 ENCOUNTER — Telehealth: Payer: Self-pay | Admitting: Adult Health Nurse Practitioner

## 2020-02-27 NOTE — Progress Notes (Signed)
error 

## 2020-02-27 NOTE — Telephone Encounter (Signed)
Wife called with concerns that the [facility] ER not lasting as long as patient led provider to believe and that his anger is getting worse.  Encouraged to reach out to office with concerns for depression and anger.  Scheduled sooner appt for 9/20 at 3pm Maddy Graham K. Olena Heckle NP

## 2020-03-17 ENCOUNTER — Other Ambulatory Visit: Payer: Medicare Other | Admitting: Adult Health Nurse Practitioner

## 2020-03-17 DIAGNOSIS — H35372 Puckering of macula, left eye: Secondary | ICD-10-CM | POA: Diagnosis not present

## 2020-03-17 DIAGNOSIS — G20 Parkinson's disease: Secondary | ICD-10-CM

## 2020-03-17 DIAGNOSIS — H43811 Vitreous degeneration, right eye: Secondary | ICD-10-CM | POA: Diagnosis not present

## 2020-03-17 DIAGNOSIS — Z515 Encounter for palliative care: Secondary | ICD-10-CM

## 2020-03-17 NOTE — Progress Notes (Signed)
[facility] Consult Note Telephone: [phone]  Fax: [phone]  PATIENT NAME: Dennis Christiansen Sr. DOB: 09-08-1945 MRN: [id number]  PRIMARY CARE PROVIDER:   [name], FNP  REFERRING PROVIDER:  [name], [place],  [place] [address]  RESPONSIBLE PARTY:    Nichael Ehly, wife [phone]   RECOMMENDATIONS and PLAN: 1.Advanced care planning. Patient is DNR.  2.  Pain.  Patient called for Xtampza 18 mg BID and oxycodone/APAP 10/325 mg 1 tab Q 4 hrs PRN refills.  Patient having to take the breakthrough PRN oxy more often and is currently stating being in severe pain. Checked patient on [place] substance abuse website.  Sent in order for increased Xtampza to 27 mg BID and refilled oxycodone/APAP 10/325 mg 1 tab Q 4 hrs PRN for pain.  Have appointment in 2 weeks  Palliative will continue to monitor for symptom management/decline and make recommendations as needed.  Next appointment is in 2 weeks.  I spent 15 minutes providing this consultation,  from 5:00 to 5:15 time spent with patient, chart review, provider coordination, documentation. More than 50% of the time in this consultation was spent coordinating communication.   HISTORY OF PRESENT ILLNESS:  Dennis Sparacino Pharris Sr. is a 74 y.o. year old male with multiple medical problems including Parkinson's disease, CHF, Addison's disease, CVA, MI, sleep apnea, hypertension, hyperlipidemia, hypothyroidism, gout, history of testicular cancer and prostate cancer. Palliative Care was asked to help address goals of care.   CODE STATUS: DNR  PPS: 40% HOSPICE ELIGIBILITY/DIAGNOSIS: TBD  PHYSICAL EXAM:   Deferred   PAST MEDICAL HISTORY:  Past Medical History:  Diagnosis Date  . Addison's disease ([place]) 01/2016  . Arthritis    low back - DDD  . Cataract 2019   corrected with surgery  . Cellulitis, scrotum 08/02/2014  . Chronic diastolic CHF  (congestive heart failure) ([place])    Echo 12/18: severe LVH, EF 60-65, Gr 1 DD // Echo 5/18: EF 65-70, Gr 1 DD  . Chronic lower back pain    "from [place] 2007"  . Complication of anesthesia    Sometimes has N&V /w anesth.   . Coronary artery disease    NSTEMI >> LHC 9/01: prox and mid LAD 50-70; mRCA 40 >> med Rx // Nuc 8/13 [facility]):  no infarct or ischemia, EF 59  . Elevated PSA   . Epididymitis, left 08/04/2014  . History of chronic bronchitis   . History of gout   . History of stroke 01/22/2016   2004 - "right brain stem; no residual " // 2017  . Hypertension   . Hypocholesteremia   . Hypothyroidism   . Infection of skin due to methicillin resistant Staphylococcus aureus (MRSA) 12/28/2017  . Kidney stone   . Myocardial infarction [facility]) 2001   2001- cardiac cath., cardiac clearanece note dr Otho Perl 05-14-13 on chart, stress test results 02-21-12 on chart  . OSA on CPAP    cpap setting of 10  . Parkinson's disease ([place])    stage 4   . Pneumonia 2000's and 2013  . PONV (postoperative nausea and vomiting)   . [facility] spotted fever   . Testicular cancer ([place]) 2015    SOCIAL HX:  Social History   Tobacco Use  . Smoking status: Never Smoker  . Smokeless tobacco: Never Used  Substance Use Topics  . Alcohol use: Not Currently    ALLERGIES:  Allergies  Allergen Reactions  .  Bee Venom Anaphylaxis  . Shrimp [Shellfish Allergy] Anaphylaxis and Other (See Comments)    "just the protein in the shrimp"  . Stadol [Butorphanol] Anaphylaxis, Shortness Of Breath and Other (See Comments)    Respiratory distress, couldn't breathe, cardiac arrest  . Wasp Venom Anaphylaxis     PERTINENT MEDICATIONS:  Outpatient Encounter Medications as of 03/17/2020  Medication Sig  . allopurinol (ZYLOPRIM) 300 MG tablet Take 450 mg by mouth daily.   Marland Kitchen apixaban (ELIQUIS) 5 MG TABS tablet Take 1 tablet (5 mg total) by mouth 2 (two) times daily.  . carbidopa-levodopa (SINEMET IR) 25-250 MG tablet Take 2  tablets by mouth 4 (four) times daily. (Patient taking differently: Take 2 tablets by mouth See admin instructions. Take 2 tablets by mouth four times daily- 8 AM, 12 PM (noon), 4 PM, and 8 PM)  . colchicine 0.6 MG tablet TAKE 1 TABLET BY MOUTH TWICE DAILY (Patient taking differently: Take 0.6 mg by mouth 2 (two) times daily. )  . dorzolamide-timolol (COSOPT) 22.3-6.8 MG/ML ophthalmic solution Place 1 drop into the left eye 2 (two) times daily.   . DULoxetine (CYMBALTA) 60 MG capsule Take 1 capsule (60 mg total) by mouth daily.  Marland Kitchen EPINEPHrine 0.3 mg/0.3 mL IJ SOAJ injection Inject 0.3 mLs (0.3 mg total) into the muscle as needed (allergic reaction).  . furosemide (LASIX) 40 MG tablet Take 1 tablet (40 mg total) by mouth daily.  . hydrocortisone (CORTEF) 10 MG tablet TAKE 2 TABLETS BY MOUTH EVERY MORNING AND 1 TABLET AT 5 PM EVERY DAY  . levothyroxine (SYNTHROID) 137 MCG tablet TAKE 2 tablets daily  before breakfast and 3 tablets on SUNDAY  . Multiple Vitamin (MULTIVITAMIN WITH MINERALS) TABS tablet Take 1 tablet by mouth daily.  Marland Kitchen oxyCODONE-acetaminophen (PERCOCET) 10-325 MG tablet Take 1 tablet by mouth every 4 (four) hours as needed for pain.  . potassium chloride (K-DUR) 10 MEQ tablet Take 1 tablet (10 mEq total) by mouth daily.  . rosuvastatin (CRESTOR) 20 MG tablet Take 1 tablet (20 mg total) by mouth daily.  Marland Kitchen testosterone cypionate (DEPO-TESTOSTERONE) 200 MG/ML injection Inject 0.6 mLs (120 mg total) into the muscle every 14 (fourteen) days.  Marland Kitchen topiramate (TOPAMAX) 50 MG tablet Take 1 tablet (50 mg total) by mouth 2 (two) times daily.   No facility-administered encounter medications on file as of 03/17/2020.      Alyzza Andringa Jenetta Downer, NP

## 2020-03-18 ENCOUNTER — Other Ambulatory Visit: Payer: Self-pay

## 2020-03-23 ENCOUNTER — Other Ambulatory Visit: Payer: Self-pay | Admitting: Endocrinology

## 2020-03-23 ENCOUNTER — Other Ambulatory Visit: Payer: Self-pay | Admitting: Family Medicine

## 2020-03-30 ENCOUNTER — Other Ambulatory Visit: Payer: Medicare Other | Admitting: Adult Health Nurse Practitioner

## 2020-03-30 ENCOUNTER — Other Ambulatory Visit: Payer: Self-pay

## 2020-03-30 DIAGNOSIS — Z515 Encounter for palliative care: Secondary | ICD-10-CM

## 2020-03-30 DIAGNOSIS — G20 Parkinson's disease: Secondary | ICD-10-CM

## 2020-03-30 NOTE — Progress Notes (Signed)
[facility] Consult Note Telephone: [phone]  Fax: [phone]  PATIENT NAME: Dennis Meir Sr. DOB: Oct 22, 1945 MRN: [id number]  PRIMARY CARE PROVIDER:   [name], FNP  REFERRING PROVIDER:  [name], [place],   [address]  RESPONSIBLE PARTY:   Broughton Eppinger, wife [phone]   RECOMMENDATIONS and PLAN: 1.Advanced care planning. Patient is DNR.  2.  Pain.  Currently has Xtampza 27 mg BID with oxy 10/325 mg Q4 hrs PRN.  Has been taking more of the PRN oxy for breakthrough.  Is describing muscle spasms in back along with sciatic pain that goes down his right leg. When having severe pain he has no appetite and wife states that he does not eat much. He has been sleeping more.  Family does state that he has been less irritable. Has increased the setting on his back stimulator.  Have ordered flexeril 5 mg Q8 hrs PRN for muscle spasms.  States that he does have lidocaine 5% patches.  Have encouraged a multimodal approach to see if this gives better relief.  Have appointment in 2 days and will consider changing oxycodone to morphine if these changes are ineffective.     Palliative will continue to monitor for symptom management/decline and make recommendations as needed.  Next appointment is in 2 days.  I spent 60 minutes providing this consultation,  from 3:00 to 4:00 including time spent with patient/family, chart review, provider coordination, documentation. More than 50% of the time in this consultation was spent coordinating communication.   HISTORY OF PRESENT ILLNESS:  Dennis Roseboom Landgren Sr. is a 74 y.o. year old male with multiple medical problems including Parkinson's disease, CHF, Addison's disease, CVA, MI, sleep apnea, hypertension, hyperlipidemia, hypothyroidism, gout, history of testicular cancer and prostate cancer. Palliative Care was asked to help address goals of care. Patient  has not had any recent falls, infections, or hospital visits.  Denies increased shortness of breath or cough, N/V/D, dysuria, hematuria.  Does have occasional constipation and takes laxatives as needed.  CODE STATUS: DNR  PPS: 40% HOSPICE ELIGIBILITY/DIAGNOSIS: TBD  PHYSICAL EXAM:   General:  frail appearing, patient is in pain and has increased tremors due to the pain Extremities:1-2+edema to bilateral feet, ankles, and lower legs with more noted to right foot though seems less today than usual, no joint deformities Skin: no rasheson exposed skin Neurological: Weakness but otherwise nonfocal  PAST MEDICAL HISTORY:  Past Medical History:  Diagnosis Date  . Addison's disease ([place]) 01/2016  . Arthritis    low back - DDD  . Cataract 2019   corrected with surgery  . Cellulitis, scrotum 08/02/2014  . Chronic diastolic CHF (congestive heart failure) ([place])    Echo 12/18: severe LVH, EF 60-65, Gr 1 DD // Echo 5/18: EF 65-70, Gr 1 DD  . Chronic lower back pain    "from [place] 2007"  . Complication of anesthesia    Sometimes has N&V /w anesth.   . Coronary artery disease    NSTEMI >> LHC 9/01: prox and mid LAD 50-70; mRCA 40 >> med Rx // Nuc 8/13 [facility]):  no infarct or ischemia, EF 59  . Elevated PSA   . Epididymitis, left 08/04/2014  . History of chronic bronchitis   . History of gout   . History of stroke 01/22/2016   2004 - "right brain stem; no residual " // 2017  . Hypertension   . Hypocholesteremia   .  Hypothyroidism   . Infection of skin due to methicillin resistant Staphylococcus aureus (MRSA) 12/28/2017  . Kidney stone   . Myocardial infarction [facility]) 2001   2001- cardiac cath., cardiac clearanece note dr Otho Perl 05-14-13 on chart, stress test results 02-21-12 on chart  . OSA on CPAP    cpap setting of 10  . Parkinson's disease ([place])    stage 4   . Pneumonia 2000's and 2013  . PONV (postoperative nausea and vomiting)   . [facility] spotted fever   . Testicular cancer  ([place]) 2015    SOCIAL HX:  Social History   Tobacco Use  . Smoking status: Never Smoker  . Smokeless tobacco: Never Used  Substance Use Topics  . Alcohol use: Not Currently    ALLERGIES:  Allergies  Allergen Reactions  . Bee Venom Anaphylaxis  . Shrimp [Shellfish Allergy] Anaphylaxis and Other (See Comments)    "just the protein in the shrimp"  . Stadol [Butorphanol] Anaphylaxis, Shortness Of Breath and Other (See Comments)    Respiratory distress, couldn't breathe, cardiac arrest  . Wasp Venom Anaphylaxis     PERTINENT MEDICATIONS:  Outpatient Encounter Medications as of 03/30/2020  Medication Sig  . allopurinol (ZYLOPRIM) 300 MG tablet Take 450 mg by mouth daily.   Marland Kitchen apixaban (ELIQUIS) 5 MG TABS tablet Take 1 tablet (5 mg total) by mouth 2 (two) times daily.  . carbidopa-levodopa (SINEMET IR) 25-250 MG tablet TAKE 2 TABLETS BY MOUTH 4  TIMES DAILY  . colchicine 0.6 MG tablet TAKE 1 TABLET BY MOUTH TWICE DAILY (Patient taking differently: Take 0.6 mg by mouth 2 (two) times daily. )  . dorzolamide-timolol (COSOPT) 22.3-6.8 MG/ML ophthalmic solution Place 1 drop into the left eye 2 (two) times daily.   . DULoxetine (CYMBALTA) 60 MG capsule Take 1 capsule (60 mg total) by mouth daily.  Marland Kitchen EPINEPHrine 0.3 mg/0.3 mL IJ SOAJ injection Inject 0.3 mLs (0.3 mg total) into the muscle as needed (allergic reaction).  . furosemide (LASIX) 40 MG tablet Take 1 tablet (40 mg total) by mouth daily.  . hydrocortisone (CORTEF) 10 MG tablet TAKE 2 TABLETS BY MOUTH EVERY MORNING AND 1 TABLET AT 5 PM EVERY DAY  . levothyroxine (SYNTHROID) 137 MCG tablet TAKE 2 TABLETS BY MOUTH  DAILY BEFORE BREAKFAST  . Multiple Vitamin (MULTIVITAMIN WITH MINERALS) TABS tablet Take 1 tablet by mouth daily.  Marland Kitchen oxyCODONE-acetaminophen (PERCOCET) 10-325 MG tablet Take 1 tablet by mouth every 4 (four) hours as needed for pain.  . potassium chloride (K-DUR) 10 MEQ tablet Take 1 tablet (10 mEq total) by mouth daily.  .  rosuvastatin (CRESTOR) 20 MG tablet Take 1 tablet (20 mg total) by mouth daily.  Marland Kitchen testosterone cypionate (DEPO-TESTOSTERONE) 200 MG/ML injection Inject 0.6 mLs (120 mg total) into the muscle every 14 (fourteen) days.  Marland Kitchen topiramate (TOPAMAX) 50 MG tablet TAKE 1 TABLET BY MOUTH  TWICE DAILY   No facility-administered encounter medications on file as of 03/30/2020.      Ambra Haverstick Jenetta Downer, NP

## 2020-03-31 ENCOUNTER — Other Ambulatory Visit: Payer: Self-pay | Admitting: Family Medicine

## 2020-03-31 ENCOUNTER — Encounter: Payer: Self-pay | Admitting: Family Medicine

## 2020-03-31 DIAGNOSIS — G8929 Other chronic pain: Secondary | ICD-10-CM

## 2020-03-31 DIAGNOSIS — M5442 Lumbago with sciatica, left side: Secondary | ICD-10-CM

## 2020-03-31 NOTE — Telephone Encounter (Signed)
Last office visit 11/18/2019 for Urinary Tract Infection.   Not on current medication list.  No future appointments.  Refill?  Also see MyChart message.

## 2020-04-01 ENCOUNTER — Other Ambulatory Visit: Payer: Medicare Other | Admitting: Adult Health Nurse Practitioner

## 2020-04-01 ENCOUNTER — Other Ambulatory Visit: Payer: Self-pay

## 2020-04-01 DIAGNOSIS — Z515 Encounter for palliative care: Secondary | ICD-10-CM | POA: Diagnosis not present

## 2020-04-01 DIAGNOSIS — G20 Parkinson's disease: Secondary | ICD-10-CM

## 2020-04-01 NOTE — Telephone Encounter (Signed)
Handicap placard permit application printed and put on your desk.

## 2020-04-01 NOTE — Telephone Encounter (Signed)
Will you please pull a handicap placard permit for this patient and put in my box? I will fill it out when I return on Monday.

## 2020-04-02 NOTE — Progress Notes (Signed)
[place] Consult Note Telephone: [phone]  Fax: [phone]  PATIENT NAME: Dennis Antos Sr. DOB: [birth year] MRN: [id number]  PRIMARY CARE PROVIDER:   [name], FNP  REFERRING PROVIDER:  [name], [place],  [place] [address]  RESPONSIBLE PARTY:  Kayan Blissett, wife [phone]   RECOMMENDATIONS and PLAN: 1.Advanced care planning. Patient is DNR.  2.  Pain.  This visit to reevaluate pain from changes made 2 days ago.  Added lidocaine 5% patches and flexeril 10 mg Q8 hrs PRN for muscle spasms.  Patient is having better pain control and is taking less breakthrough oxy.  He does state that he notices his sciatic pain more with the pain of the muscle spasms better relieved.  Has slept better the last 2 nights.  Today he is able to concentrate better and his mobility though still limited is somewhat better.  Continue current pain regimen and will follow up in 2 weeks to reevaluate.  I spent 30 minutes providing this consultation,  from 3:45 to 4:15 including time spent with patient/family, chart review, provider coordination, documentation. More than 50% of the time in this consultation was spent coordinating communication.   HISTORY OF PRESENT ILLNESS:  Dennis Mally Hostetler Sr. is a 74 y.o. year old male with multiple medical problems including Parkinson's disease, CHF, Addison's disease, CVA, MI, sleep apnea, hypertension, hyperlipidemia, hypothyroidism, gout, history of testicular cancer and prostate cancer. Palliative Care was asked to help address goals of care.   CODE STATUS: DNR  PPS: 40% HOSPICE ELIGIBILITY/DIAGNOSIS: TBD  PHYSICAL EXAM:   General:  frail appearing, patient is in pain and has increased tremors due to the pain Extremities:1-2+edema to bilateral feet, ankles, and lower legs with more noted to right footthough seems less today than usual, no joint  deformities Skin: no rasheson exposed skin Neurological: Weakness but otherwise nonfocal  PAST MEDICAL HISTORY:  Past Medical History:  Diagnosis Date  . Addison's disease ([place]) 01/2016  . Arthritis    low back - DDD  . Cataract 2019   corrected with surgery  . Cellulitis, scrotum 08/02/2014  . Chronic diastolic CHF (congestive heart failure) ([place])    Echo 12/18: severe LVH, EF 60-65, Gr 1 DD // Echo 5/18: EF 65-70, Gr 1 DD  . Chronic lower back pain    "from [place] 2007"  . Complication of anesthesia    Sometimes has N&V /w anesth.   . Coronary artery disease    NSTEMI >> LHC 9/01: prox and mid LAD 50-70; mRCA 40 >> med Rx // Nuc 8/13 [facility]):  no infarct or ischemia, EF 59  . Elevated PSA   . Epididymitis, left 08/04/2014  . History of chronic bronchitis   . History of gout   . History of stroke 01/22/2016   2004 - "right brain stem; no residual " // 2017  . Hypertension   . Hypocholesteremia   . Hypothyroidism   . Infection of skin due to methicillin resistant Staphylococcus aureus (MRSA) 12/28/2017  . Kidney stone   . Myocardial infarction [facility]) 2001   2001- cardiac cath., cardiac clearanece note dr Otho Perl 05-14-13 on chart, stress test results 02-21-12 on chart  . OSA on CPAP    cpap setting of 10  . Parkinson's disease ([place])    stage 4   . Pneumonia 2000's and 2013  . PONV (postoperative nausea and vomiting)   . [facility] spotted fever   .  Testicular cancer ([place]) 2015    SOCIAL HX:  Social History   Tobacco Use  . Smoking status: Never Smoker  . Smokeless tobacco: Never Used  Substance Use Topics  . Alcohol use: Not Currently    ALLERGIES:  Allergies  Allergen Reactions  . Bee Venom Anaphylaxis  . Shrimp [Shellfish Allergy] Anaphylaxis and Other (See Comments)    "just the protein in the shrimp"  . Stadol [Butorphanol] Anaphylaxis, Shortness Of Breath and Other (See Comments)    Respiratory distress, couldn't breathe, cardiac arrest  . Wasp Venom  Anaphylaxis     PERTINENT MEDICATIONS:  Outpatient Encounter Medications as of 04/01/2020  Medication Sig  . allopurinol (ZYLOPRIM) 300 MG tablet Take 450 mg by mouth daily.   Marland Kitchen apixaban (ELIQUIS) 5 MG TABS tablet Take 1 tablet (5 mg total) by mouth 2 (two) times daily.  . carbidopa-levodopa (SINEMET IR) 25-250 MG tablet TAKE 2 TABLETS BY MOUTH 4  TIMES DAILY  . colchicine 0.6 MG tablet TAKE 1 TABLET BY MOUTH TWICE DAILY (Patient taking differently: Take 0.6 mg by mouth 2 (two) times daily. )  . dorzolamide-timolol (COSOPT) 22.3-6.8 MG/ML ophthalmic solution Place 1 drop into the left eye 2 (two) times daily.   . DULoxetine (CYMBALTA) 60 MG capsule Take 1 capsule (60 mg total) by mouth daily.  Marland Kitchen EPINEPHrine 0.3 mg/0.3 mL IJ SOAJ injection Inject 0.3 mLs (0.3 mg total) into the muscle as needed (allergic reaction).  . hydrocortisone (CORTEF) 10 MG tablet TAKE 2 TABLETS BY MOUTH EVERY MORNING AND 1 TABLET AT 5 PM EVERY DAY  . levothyroxine (SYNTHROID) 137 MCG tablet TAKE 2 TABLETS BY MOUTH  DAILY BEFORE BREAKFAST  . lidocaine (LIDODERM) 5 % APPLY 1 PATCH TO SKIN EVERY DAY. REMOVE AND DISCARD AFTER 12 HOURS OR AS DIRECTED BY MD  . Multiple Vitamin (MULTIVITAMIN WITH MINERALS) TABS tablet Take 1 tablet by mouth daily.  Marland Kitchen oxyCODONE-acetaminophen (PERCOCET) 10-325 MG tablet Take 1 tablet by mouth every 4 (four) hours as needed for pain.  . potassium chloride (K-DUR) 10 MEQ tablet Take 1 tablet (10 mEq total) by mouth daily.  . rosuvastatin (CRESTOR) 20 MG tablet Take 1 tablet (20 mg total) by mouth daily.  Marland Kitchen testosterone cypionate (DEPO-TESTOSTERONE) 200 MG/ML injection Inject 0.6 mLs (120 mg total) into the muscle every 14 (fourteen) days.  Marland Kitchen topiramate (TOPAMAX) 50 MG tablet TAKE 1 TABLET BY MOUTH  TWICE DAILY   No facility-administered encounter medications on file as of 04/01/2020.     Kody Vigil Jenetta Downer, NP

## 2020-04-06 ENCOUNTER — Other Ambulatory Visit: Payer: Self-pay

## 2020-04-07 MED ORDER — "SYRINGE 21G X 1"" 3 ML MISC": 3 refills | Status: AC

## 2020-04-07 MED ORDER — "NEEDLE (DISP) 22G X 1"" MISC": 3 refills | Status: AC

## 2020-04-08 ENCOUNTER — Telehealth: Payer: Self-pay

## 2020-04-08 NOTE — Telephone Encounter (Signed)
Called pt to inform him placard is ready for pick up for him and his wife.  Pt will stop by office tomorrow to pick up application.

## 2020-04-09 ENCOUNTER — Other Ambulatory Visit: Payer: Self-pay | Admitting: Family Medicine

## 2020-04-09 NOTE — Telephone Encounter (Signed)
Patient seen 5/21/

## 2020-04-09 NOTE — Telephone Encounter (Signed)
Last appt 04/30/2019. Last refill 02/24/2019. No upcoming appt.

## 2020-04-14 ENCOUNTER — Telehealth: Payer: Self-pay

## 2020-04-14 ENCOUNTER — Other Ambulatory Visit: Payer: Medicare Other | Admitting: Adult Health Nurse Practitioner

## 2020-04-14 ENCOUNTER — Other Ambulatory Visit: Payer: Self-pay

## 2020-04-14 DIAGNOSIS — M5442 Lumbago with sciatica, left side: Secondary | ICD-10-CM | POA: Diagnosis not present

## 2020-04-14 DIAGNOSIS — M5441 Lumbago with sciatica, right side: Secondary | ICD-10-CM | POA: Diagnosis not present

## 2020-04-14 DIAGNOSIS — G8929 Other chronic pain: Secondary | ICD-10-CM | POA: Diagnosis not present

## 2020-04-14 DIAGNOSIS — R32 Unspecified urinary incontinence: Secondary | ICD-10-CM

## 2020-04-14 NOTE — Progress Notes (Signed)
[place] Consult Note Telephone: [phone]  Fax: [phone]  PATIENT NAME: Dennis Vonada Sr. DOB: 07-06-1946 MRN: [id number]  PRIMARY CARE PROVIDER:   [name], FNP  REFERRING PROVIDER:  [name], [place],  [place] [address]  RESPONSIBLE PARTY:   Dennis [name], wife [phone]   RECOMMENDATIONS and PLAN: 1.Advanced care planning. Patient is DNR.  In light of the increased low back pain and loss of bladder function have recommended that patient go to the ER for evaluation.  With the pandemic going on can understand why patient does not want to go to the ER.  Did call patient's PCP office with concerns and to set up appointment for evaluation as soon as possible.  Front desk forwarded patient to triage nursing in which someone took down his information and did state that someone would call to set up an appointment with him.  Did discuss that his PCP office may also want him to go to the ER for evaluation.  Discussed that until testing and imaging are done to figure out exactly what is going on that there will be no changes made to his plan of care at this time.  Encouraged to rest, continue getting plenty of fluids, and to reduce the amount of his oxycodone that he uses as this could increase kidney injury if he is having pyelonephritis.  Patient expressed understanding and is awaiting callback from PCP office.  Patient states that he will update me when he gets an appointment.  If do not hear anything back from patient will call him in 2 days.  Set up appointment for next week for follow-up  I spent 40 minutes providing this consultation,  from 3:00 to 3:40 includingtime spent with patient/family, chart review, provider coordination, documentation. More than 50% of the time in this consultation was spent coordinating communication.   HISTORY OF PRESENT ILLNESS:  Dennis Hair Brossman Sr. is a  74 y.o. year old male with multiple medical problems including Parkinson's disease, CHF, Addison's disease, CVA, MI, sleep apnea, hypertension, hyperlipidemia, hypothyroidism, gout, history of testicular cancer and prostate cancer. Palliative Care was asked to help address goals of care.  Patient states about 3 to 4 days ago that it felt like his "S1 blew" causing increased back pain.  States that he is also started having increased urination and loss of bowel function.  States that by the time he feels like he has to urinate he is already urinating.  Patient states that even though he is drinking plenty of fluids and is urinating more that his urine is almost brown in color.  Has had hard stools despite taking MiraLAX every day.  Denies trauma, fever, N/V/D, increased shortness of breath or cough, chest pain.  CODE STATUS: DNR  PPS: 40% HOSPICE ELIGIBILITY/DIAGNOSIS: TBD  PHYSICAL EXAM:  BP 138/98 HR 73 O2 97% on room air General: Patient does have slowed movement and some moaning with increased pain today Cardiovascular: regular rate and rhythm Pulmonary: Lung sounds clear; normal respiratory effort Extremities:1-2+edema to bilateral feet, ankles, and lower legs with more noted to right footthough seems less today than usual, no joint deformities Skin: no rasheson exposed skin; does have scratches to left lower leg from new kitten and home.  There is some weeping noted around the scratches Neurological: Weakness but otherwise nonfocal  PAST MEDICAL HISTORY:  Past Medical History:  Diagnosis Date  . Addison's disease ([place]) 01/2016  .  Arthritis    low back - DDD  . Cataract 2019   corrected with surgery  . Cellulitis, scrotum 08/02/2014  . Chronic diastolic CHF (congestive heart failure) ([place])    Echo 12/18: severe LVH, EF 60-65, Gr 1 DD // Echo 5/18: EF 65-70, Gr 1 DD  . Chronic lower back pain    "from [place] 2007"  . Complication of anesthesia    Sometimes has N&V /w anesth.   .  Coronary artery disease    NSTEMI >> LHC 9/01: prox and mid LAD 50-70; mRCA 40 >> med Rx // Nuc 8/13 [facility]):  no infarct or ischemia, EF 59  . Elevated PSA   . Epididymitis, left 08/04/2014  . History of chronic bronchitis   . History of gout   . History of stroke 01/22/2016   2004 - "right brain stem; no residual " // 2017  . Hypertension   . Hypocholesteremia   . Hypothyroidism   . Infection of skin due to methicillin resistant Staphylococcus aureus (MRSA) 12/28/2017  . Kidney stone   . Myocardial infarction [facility]) 2001   2001- cardiac cath., cardiac clearanece note dr Otho Perl 05-14-13 on chart, stress test results 02-21-12 on chart  . OSA on CPAP    cpap setting of 10  . Parkinson's disease ([place])    stage 4   . Pneumonia 2000's and 2013  . PONV (postoperative nausea and vomiting)   . [facility] spotted fever   . Testicular cancer ([place]) 2015    SOCIAL HX:  Social History   Tobacco Use  . Smoking status: Never Smoker  . Smokeless tobacco: Never Used  Substance Use Topics  . Alcohol use: Not Currently    ALLERGIES:  Allergies  Allergen Reactions  . Bee Venom Anaphylaxis  . Shrimp [Shellfish Allergy] Anaphylaxis and Other (See Comments)    "just the protein in the shrimp"  . Stadol [Butorphanol] Anaphylaxis, Shortness Of Breath and Other (See Comments)    Respiratory distress, couldn't breathe, cardiac arrest  . Wasp Venom Anaphylaxis     PERTINENT MEDICATIONS:  Outpatient Encounter Medications as of 04/14/2020  Medication Sig  . allopurinol (ZYLOPRIM) 300 MG tablet Take 450 mg by mouth daily.   Marland Kitchen apixaban (ELIQUIS) 5 MG TABS tablet Take 1 tablet (5 mg total) by mouth 2 (two) times daily.  . carbidopa-levodopa (SINEMET IR) 25-250 MG tablet TAKE 2 TABLETS BY MOUTH 4  TIMES DAILY  . colchicine 0.6 MG tablet TAKE 1 TABLET BY MOUTH TWICE DAILY (Patient taking differently: Take 0.6 mg by mouth 2 (two) times daily. )  . dorzolamide-timolol (COSOPT) 22.3-6.8 MG/ML ophthalmic  solution Place 1 drop into the left eye 2 (two) times daily.   . DULoxetine (CYMBALTA) 60 MG capsule Take 1 capsule (60 mg total) by mouth daily.  Marland Kitchen EPINEPHrine 0.3 mg/0.3 mL IJ SOAJ injection Inject 0.3 mLs (0.3 mg total) into the muscle as needed (allergic reaction).  . furosemide (LASIX) 40 MG tablet TAKE 1 TABLET(40 MG) BY MOUTH DAILY  . hydrocortisone (CORTEF) 10 MG tablet TAKE 2 TABLETS BY MOUTH EVERY MORNING AND 1 TABLET AT 5 PM EVERY DAY  . levothyroxine (SYNTHROID) 137 MCG tablet TAKE 2 TABLETS BY MOUTH  DAILY BEFORE BREAKFAST  . lidocaine (LIDODERM) 5 % APPLY 1 PATCH TO SKIN EVERY DAY. REMOVE AND DISCARD AFTER 12 HOURS OR AS DIRECTED BY MD  . Multiple Vitamin (MULTIVITAMIN WITH MINERALS) TABS tablet Take 1 tablet by mouth daily.  Marland Kitchen NEEDLE, DISP, 22 G 22G  X 1" MISC Use every 14 days  . oxyCODONE-acetaminophen (PERCOCET) 10-325 MG tablet Take 1 tablet by mouth every 4 (four) hours as needed for pain.  . potassium chloride (K-DUR) 10 MEQ tablet Take 1 tablet (10 mEq total) by mouth daily.  . rosuvastatin (CRESTOR) 20 MG tablet Take 1 tablet (20 mg total) by mouth daily.  . Syringe/Needle, Disp, (SYRINGE 3CC/21GX1") 21G X 1" 3 ML MISC Use every 14 days  . testosterone cypionate (DEPO-TESTOSTERONE) 200 MG/ML injection Inject 0.6 mLs (120 mg total) into the muscle every 14 (fourteen) days.  Marland Kitchen topiramate (TOPAMAX) 50 MG tablet TAKE 1 TABLET BY MOUTH  TWICE DAILY   No facility-administered encounter medications on file as of 04/14/2020.     Anahli Arvanitis Jenetta Downer, NP

## 2020-04-14 NOTE — Telephone Encounter (Signed)
Pt said that access nurse said that pt needs appt within 24 hours; no available appts; pt cannot hold urine and when urinates urine is brown in color. Pt having back pain. Pt feels hot has not checked temp; having rt sided lower back pain; no abd pain noted. Pt has loss of taste for a good while; pt is very constipated. Pt has hx of kidney stones. Pt said he has sulfa left from another UTI should he start that med; I advised no because needs to have C&S to see what kind of bacteria is in urine if any and what abx the bacteria will respond to. pt voiced understanding.No available appts at [facility] today or tomorrow; pt said he will go to [facility] in [place] now. FYI to Glenda Chroman FNP.

## 2020-04-14 NOTE — Telephone Encounter (Signed)
Called and spoke to patient. Will see him in the office tomorrow at 12:30.

## 2020-04-14 NOTE — Telephone Encounter (Signed)
Pt is experiencing increased back pain and loss of bladder control. Transferred pt to access nurse to be triaged.

## 2020-04-15 ENCOUNTER — Encounter: Payer: Self-pay | Admitting: Family Medicine

## 2020-04-15 ENCOUNTER — Telehealth: Payer: Self-pay | Admitting: Adult Health Nurse Practitioner

## 2020-04-15 ENCOUNTER — Ambulatory Visit (INDEPENDENT_AMBULATORY_CARE_PROVIDER_SITE_OTHER): Payer: Medicare Other | Admitting: Family Medicine

## 2020-04-15 ENCOUNTER — Other Ambulatory Visit: Payer: Self-pay

## 2020-04-15 ENCOUNTER — Ambulatory Visit (INDEPENDENT_AMBULATORY_CARE_PROVIDER_SITE_OTHER)
Admission: RE | Admit: 2020-04-15 | Discharge: 2020-04-15 | Disposition: A | Discharge status: Home / Self Care | Payer: Medicare Other | Source: Ambulatory Visit | Attending: Family Medicine | Admitting: Family Medicine

## 2020-04-15 VITALS — BP 132/90 | HR 121 | Temp 96.9°F | Wt 377.0 lb

## 2020-04-15 DIAGNOSIS — R350 Frequency of micturition: Secondary | ICD-10-CM | POA: Diagnosis not present

## 2020-04-15 DIAGNOSIS — I6340 Cerebral infarction due to embolism of unspecified cerebral artery: Secondary | ICD-10-CM | POA: Diagnosis not present

## 2020-04-15 DIAGNOSIS — N3090 Cystitis, unspecified without hematuria: Secondary | ICD-10-CM

## 2020-04-15 DIAGNOSIS — K5900 Constipation, unspecified: Secondary | ICD-10-CM | POA: Diagnosis not present

## 2020-04-15 DIAGNOSIS — R3589 Other polyuria: Secondary | ICD-10-CM | POA: Diagnosis not present

## 2020-04-15 DIAGNOSIS — K5903 Drug induced constipation: Secondary | ICD-10-CM

## 2020-04-15 DIAGNOSIS — R82998 Other abnormal findings in urine: Secondary | ICD-10-CM

## 2020-04-15 DIAGNOSIS — R7309 Other abnormal glucose: Secondary | ICD-10-CM

## 2020-04-15 LAB — COMPREHENSIVE METABOLIC PANEL
ALT: 20 U/L (ref 0–53)
AST: 16 U/L (ref 0–37)
Albumin: 4.1 g/dL (ref 3.5–5.2)
Alkaline Phosphatase: 40 U/L (ref 39–117)
BUN: 15 mg/dL (ref 6–23)
CO2: 31 mEq/L (ref 19–32)
Calcium: 9.7 mg/dL (ref 8.4–10.5)
Chloride: 100 mEq/L (ref 96–112)
Creatinine, Ser: 1.14 mg/dL (ref 0.40–1.50)
GFR: 62.77 mL/min (ref >60.00)
Glucose, Bld: 118 mg/dL — ABNORMAL HIGH (ref 70–99)
Potassium: 4.5 mEq/L (ref 3.5–5.1)
Sodium: 136 mEq/L (ref 135–145)
Total Bilirubin: 1.5 mg/dL — ABNORMAL HIGH (ref 0.2–1.2)
Total Protein: 7.3 g/dL (ref 6.0–8.3)

## 2020-04-15 LAB — POC URINALSYSI DIPSTICK (AUTOMATED)
Bilirubin, UA: POSITIVE
Blood, UA: POSITIVE
Glucose, UA: NEGATIVE
Ketones, UA: POSITIVE
Nitrite, UA: POSITIVE
Protein, UA: POSITIVE — AB
Spec Grav, UA: >=1.03 — AB (ref 1.010–1.025)
Urobilinogen, UA: 1 E.U./dL
pH, UA: 6 (ref 5.0–8.0)

## 2020-04-15 LAB — CBC WITH DIFFERENTIAL/PLATELET
Basophils Absolute: 0 10*3/uL (ref 0.0–0.1)
Basophils Relative: 0.6 % (ref 0.0–3.0)
Eosinophils Absolute: 0.2 10*3/uL (ref 0.0–0.7)
Eosinophils Relative: 4.2 % (ref 0.0–5.0)
HCT: 43.5 % (ref 39.0–52.0)
Hemoglobin: 14.7 g/dL (ref 13.0–17.0)
Lymphocytes Relative: 28.3 % (ref 12.0–46.0)
Lymphs Abs: 1.3 10*3/uL (ref 0.7–4.0)
MCHC: 33.9 g/dL (ref 30.0–36.0)
MCV: 85.3 fl (ref 78.0–100.0)
Monocytes Absolute: 0.5 10*3/uL (ref 0.1–1.0)
Monocytes Relative: 9.7 % (ref 3.0–12.0)
Neutro Abs: 2.7 10*3/uL (ref 1.4–7.7)
Neutrophils Relative %: 57.2 % (ref 43.0–77.0)
Platelets: 145 10*3/uL — ABNORMAL LOW (ref 150.0–400.0)
RBC: 5.1 Mil/uL (ref 4.22–5.81)
RDW: 13.8 % (ref 11.5–15.5)
WBC: 4.7 10*3/uL (ref 4.0–10.5)

## 2020-04-15 LAB — HEMOGLOBIN A1C: Hgb A1c MFr Bld: 5.8 % (ref 4.6–6.5)

## 2020-04-15 LAB — MICROALBUMIN / CREATININE URINE RATIO
Creatinine,U: 216.9 mg/dL
Microalb Creat Ratio: 10.9 mg/g (ref 0.0–30.0)
Microalb, Ur: 23.6 mg/dL — ABNORMAL HIGH (ref 0.0–1.9)

## 2020-04-15 MED ORDER — CEPHALEXIN 500 MG PO CAPS: 500.0000 mg | ORAL_CAPSULE | Freq: Three times a day (TID) | ORAL | 0 refills | Status: DC

## 2020-04-15 NOTE — Patient Instructions (Signed)
Good to see you today  For constipation, please take a laxative like Dulcolax followed by 2 doses of Miralax (take one hour apart)  I will notify you of results

## 2020-04-15 NOTE — Telephone Encounter (Signed)
Spoke with patient to follow up on increased back pain and loss of bladder control.  His PCP was able to see him today.  Abdominal xrays, urinalysis, and blood work were done.  Patient is back home and waiting for results.  Encouraged to call with any concerns. Jenae Tomasello K. Olena Heckle NP

## 2020-04-15 NOTE — Telephone Encounter (Signed)
[facility] - Client TELEPHONE ADVICE RECORD [facility] Patient Name: Dennis [name] SR Gender: Male DOB: April 05, 1946 Age: 74 Y 5 M 5 D Return Phone Number: [phone] (Primary), [phone] (Secondary) Address: [place] [address] Client [facility] - Client Client Site [facility] - Day Physician Tor Netters- NP Contact Type Call Who Is Calling Patient / Member / Family / Caregiver Call Type Triage / Clinical Relationship To Patient Self Return Phone Number [phone] (Primary) Chief Complaint Back Pain - General Reason for Call Symptomatic / Request for [place] states is from [facility], pt is experiencing back pain and loss of bladder control. Translation No Nurse Assessment Nurse: Joya Gaskins, RN, Vonna Kotyk Date/Time Eilene Ghazi Time): 04/14/2020 4:04:33 PM Confirm and document reason for call. If symptomatic, describe symptoms. ---Caller states that he is experiencing back pain and loss of bladder control. Urine is dark and browning in color. Does the patient have any new or worsening symptoms? ---Yes Will a triage be completed? ---Yes Related visit to physician within the last 2 weeks? ---No Does the PT have any chronic conditions? (i.e. diabetes, asthma, this includes High risk factors for pregnancy, etc.) ---Yes List chronic conditions. ---prostate, testicle removed, CA hx, lumbar sx, Addison's, Is this a behavioral health or substance abuse call? ---No Guidelines Guideline Title Affirmed Question Affirmed Notes Nurse Date/Time Eilene Ghazi Time) Urinary Symptoms Side (flank) or lower back pain present Rachel Moulds 04/14/2020 4:06:49 PM Disp. Time Eilene Ghazi Time) Disposition Final User 04/14/2020 4:08:31 PM See PCP within 24 Hours Yes Joya Gaskins, RN, Aviva Kluver Disagree/Comply Comply Caller Understands Yes PreDisposition Call  Doctor PLEASE NOTE: All timestamps contained within this report are represented as [place] Standard Time. CONFIDENTIALTY NOTICE: This fax transmission is intended only for the addressee. It contains information that is legally privileged, confidential or otherwise protected from use or disclosure. If you are not the intended recipient, you are strictly prohibited from reviewing, disclosing, copying using or disseminating any of this information or taking any action in reliance on or regarding this information. If you have received this fax in error, please notify us immediately by telephone so that we can arrange for its return to [name]. Phone: [phone], Toll-Free: [phone], Fax: [phone] Page: 2 of 2 Call Id: [id number] Care Advice Given Per Guideline SEE PCP WITHIN 24 HOURS: * IF OFFICE WILL BE OPEN: You need to be examined within the next 24 hours. Call your doctor (or NP/PA) when the office opens and make an appointment. PAIN MEDICINES: CALL BACK IF: * Fever occurs * Unable to urinate and bladder feels full * You become worse CARE ADVICE given per Urinary Symptoms (Adult) guideline. Referrals REFERRED TO PCP OFFICE

## 2020-04-15 NOTE — Progress Notes (Signed)
Subjective:    Patient ID: Dennis Fountain Sr., male    DOB: 1945/11/03, 74 y.o.   MRN: [id number]  HPI Chief Complaint  Patient presents with  . Urinary Frequency    x 4days dark urine. Filling urinal 7-8 times a day  . Back Pain   This is a 74 yo male who presents today with above concerns. He is accompanied by his son.   Noticed significantly increased urine out put over last several days. Increased urgency. Was dark but this morning was a little lighter. No burning. No fever.   Has noticed increased constipation. Has been taking one dose of Miralax daily. Having large, hard bowel movements, every 1- 2 days. Does not feel bloated.   Decreased appetite.   Continues to be followed by palliative care for advanced Parkinson's disease and chronic back pain.  Also followed by neurosurgery and has a spinal nerve stimulator in place.  Recent visit with palliative care to improve pain control.  Review of Systems Denies headache, visual change, chest pain, shortness of breath, fever    Objective:   Physical Exam Vitals reviewed.  Constitutional:      General: He is not in acute distress.    Appearance: He is obese. He is ill-appearing and diaphoretic. He is not toxic-appearing.  HENT:     Head: Normocephalic and atraumatic.  Eyes:     Conjunctiva/sclera: Conjunctivae normal.  Cardiovascular:     Rate and Rhythm: Regular rhythm. Tachycardia present.     Heart sounds: Normal heart sounds.  Pulmonary:     Effort: Pulmonary effort is normal.     Breath sounds: Normal breath sounds.  Abdominal:     General: There is distension (mild).     Tenderness: There is no abdominal tenderness. There is no right CVA tenderness, left CVA tenderness, guarding or rebound.  Musculoskeletal:     Right lower leg: Edema (1+) present.     Left lower leg: Edema (1+) present.     Comments: Slow, ataxic gait.  Skin:    Comments: Extremities with chronic hyperpigmentation.  Neurological:     Mental  Status: He is alert and oriented to person, place, and time.  Psychiatric:        Behavior: Behavior normal.        Thought Content: Thought content normal.        Judgment: Judgment normal.     Comments: Depressed mood       BP 132/90   Pulse (!) 121   Temp (!) 96.9 F (36.1 C) (Temporal)   Wt (!) 377 lb (171 kg)   HC 127" (322.6 cm)   SpO2 93%   BMI 44.71 kg/m  Wt Readings from Last 3 Encounters:  04/15/20 (!) 377 lb (171 kg)  11/18/19 (!) 343 lb (155.6 kg)  09/25/19 (!) 378 lb (171.5 kg)       Assessment & Plan:  1. Urinary frequency - CBC with Differential - Comprehensive metabolic panel - Microalbumin / creatinine urine ratio - POCT Urinalysis Dipstick (Automated) - Hemoglobin A1c  2. Dark urine - Comprehensive metabolic panel - Urine Culture  3. Polyuria - Comprehensive metabolic panel - POCT Urinalysis Dipstick (Automated) - Hemoglobin A1c  4. Morbid obesity ([place]) - Hemoglobin A1c  5. Elevated glucose - Hemoglobin A1c  6. Drug-induced constipation - DG Abd 1 View; Future  7. Cystitis - cephALEXin (KEFLEX) 500 MG capsule; Take 1 capsule (500 mg total) by mouth 3 (three) times daily.  Dispense:  21 capsule; Refill: 0  This visit occurred during the SARS-CoV-2 public health emergency.  Safety protocols were in place, including screening questions prior to the visit, additional usage of staff PPE, and extensive cleaning of exam room while observing appropriate contact time as indicated for disinfecting solutions.    Clarene Reamer, FNP-BC  [facility]  04/21/2020 7:31 AM

## 2020-04-15 NOTE — Telephone Encounter (Signed)
Per this note Glenda Chroman FNP has spoken with pt and pt has appt today with Glenda Chroman FNP.

## 2020-04-17 ENCOUNTER — Ambulatory Visit: Payer: Medicare Other | Admitting: Family Medicine

## 2020-04-17 ENCOUNTER — Encounter: Payer: Self-pay | Admitting: Family Medicine

## 2020-04-17 LAB — URINE CULTURE
MICRO NUMBER:: 11038984
SPECIMEN QUALITY:: ADEQUATE

## 2020-04-20 ENCOUNTER — Other Ambulatory Visit: Payer: Self-pay | Admitting: Endocrinology

## 2020-04-21 ENCOUNTER — Encounter: Payer: Self-pay | Admitting: Family Medicine

## 2020-04-21 ENCOUNTER — Other Ambulatory Visit: Payer: Medicare Other | Admitting: Adult Health Nurse Practitioner

## 2020-04-21 ENCOUNTER — Other Ambulatory Visit: Payer: Self-pay

## 2020-04-21 DIAGNOSIS — G8929 Other chronic pain: Secondary | ICD-10-CM | POA: Diagnosis not present

## 2020-04-21 DIAGNOSIS — M5441 Lumbago with sciatica, right side: Secondary | ICD-10-CM | POA: Diagnosis not present

## 2020-04-21 DIAGNOSIS — M5442 Lumbago with sciatica, left side: Secondary | ICD-10-CM | POA: Diagnosis not present

## 2020-04-21 DIAGNOSIS — Z515 Encounter for palliative care: Secondary | ICD-10-CM

## 2020-04-21 NOTE — Progress Notes (Signed)
[facility] Consult Note Telephone: [phone]  Fax: [phone]  PATIENT NAME: Dennis Casale Sr. DOB: March 31, 1946 MRN: [id number]  PRIMARY CARE PROVIDER:   [name], FNP  REFERRING PROVIDER:  [name], [place],  [place] [address]  RESPONSIBLE PARTY:   Gaetan Spieker, wife [phone]   RECOMMENDATIONS and PLAN: 1.Advanced care planning. Patient is DNR.  2.  UTI.  Patient was having increased low back pain, dark urine, incontinence at visit last week.  Was seen by PCP and found to have UTI and was started on Keflex.  States that his urine is no longer dark and he is not having any more incontinent episodes.  Denies dysuria, fever, hematuria.  Patient encouraged to continue Keflex for full course  3.  Pain.  After treatment for UTI patient's low back pain has subsided some.  Still gets increased pain later in the afternoon.  Patient is currently on Xtampza ER 27 mg twice daily and oxycodone 10 mg every 4 hours as needed.  He is having to take as needed [place] codon almost every 4 hours to stay on top of the pain.  Also uses Flexeril 10 mg 3 times daily as needed and uses lidocaine patches throughout the day which adds to the relief of his pain.  Patient was advised to start neuro physical therapy about a year ago but has had several setbacks especially with retinal detachment.  Has received clearance from his eye doctor that he can participate in the neuro physical therapy.  Patient states he has the number to call to get this set up and is encouraged to do so.  Patient has gotten weaker and less active and discussed the benefits of participating in physical therapy for reconditioning of his muscles which may also give benefits for pain relief as well.  Will consider increasing the Xtampza ER to 36 mg twice daily when it is time for refill to see if this will give him better relief and less pill  burden of taking the oxycodone every 4 hours.  4.  Constipation.  Patient does take MiraLAX daily and Dulcolax 1 Daily.  States that since starting his antibiotic he has had several soft stools.  Have encouraged that once he is done with his antibiotic to use the Dulcolax twice daily to see if this will give him better relief of his constipation.  Denies increased shortness of breath or cough, fever, N/V/D.  Denies falls.  Palliative will continue to monitor for symptom management/decline and make recommendations as needed.  Next appointment is in 3 weeks.  Patient and wife encouraged to call with any questions or concerns.   I spent 50 minutes providing this consultation,  from 12:30 to 1:20 including time spent with patient/family, chart review, provider coordination, documentation. More than 50% of the time in this consultation was spent coordinating communication.   HISTORY OF PRESENT ILLNESS:  Dennis Carpenito Berni Sr. is a 74 y.o. year old male with multiple medical problems including Parkinson's disease, CHF, Addison's disease, CVA, MI, sleep apnea, hypertension, hyperlipidemia, hypothyroidism, gout, history of testicular cancer and prostate cancer. Palliative Care was asked to help address goals of care.   CODE STATUS: DNR  PPS: 40% HOSPICE ELIGIBILITY/DIAGNOSIS: TBD  PHYSICAL EXAM:   General: Patient does have slowed movement and has noted discomfort with movement Extremities:1-2+edema to bilateral feet, ankles, and lower legs with more noted to right foot Skin: no rasheson exposed  skin Neurological: Weakness but otherwise nonfocal  PAST MEDICAL HISTORY:  Past Medical History:  Diagnosis Date  . Addison's disease ([place]) 01/2016  . Arthritis    low back - DDD  . Cataract 2019   corrected with surgery  . Cellulitis, scrotum 08/02/2014  . Chronic diastolic CHF (congestive heart failure) ([place])    Echo 12/18: severe LVH, EF 60-65, Gr 1 DD // Echo 5/18: EF 65-70, Gr 1 DD  . Chronic  lower back pain    "from [place] 2007"  . Complication of anesthesia    Sometimes has N&V /w anesth.   . Coronary artery disease    NSTEMI >> LHC 9/01: prox and mid LAD 50-70; mRCA 40 >> med Rx // Nuc 8/13 [facility]):  no infarct or ischemia, EF 59  . Elevated PSA   . Epididymitis, left 08/04/2014  . History of chronic bronchitis   . History of gout   . History of stroke 01/22/2016   2004 - "right brain stem; no residual " // 2017  . Hypertension   . Hypocholesteremia   . Hypothyroidism   . Infection of skin due to methicillin resistant Staphylococcus aureus (MRSA) 12/28/2017  . Kidney stone   . Myocardial infarction [facility]) 2001   2001- cardiac cath., cardiac clearanece note dr Otho Perl 05-14-13 on chart, stress test results 02-21-12 on chart  . OSA on CPAP    cpap setting of 10  . Parkinson's disease ([place])    stage 4   . Pneumonia 2000's and 2013  . PONV (postoperative nausea and vomiting)   . [facility] spotted fever   . Testicular cancer ([place]) 2015    SOCIAL HX:  Social History   Tobacco Use  . Smoking status: Never Smoker  . Smokeless tobacco: Never Used  Substance Use Topics  . Alcohol use: Not Currently    ALLERGIES:  Allergies  Allergen Reactions  . Bee Venom Anaphylaxis  . Shrimp [Shellfish Allergy] Anaphylaxis and Other (See Comments)    "just the protein in the shrimp"  . Stadol [Butorphanol] Anaphylaxis, Shortness Of Breath and Other (See Comments)    Respiratory distress, couldn't breathe, cardiac arrest  . Wasp Venom Anaphylaxis     PERTINENT MEDICATIONS:  Outpatient Encounter Medications as of 04/21/2020  Medication Sig  . allopurinol (ZYLOPRIM) 300 MG tablet Take 450 mg by mouth daily.   Marland Kitchen apixaban (ELIQUIS) 5 MG TABS tablet Take 1 tablet (5 mg total) by mouth 2 (two) times daily.  . carbidopa-levodopa (SINEMET IR) 25-250 MG tablet TAKE 2 TABLETS BY MOUTH 4  TIMES DAILY  . cephALEXin (KEFLEX) 500 MG capsule Take 1 capsule (500 mg total) by mouth 3 (three)  times daily.  . colchicine 0.6 MG tablet TAKE 1 TABLET BY MOUTH TWICE DAILY (Patient taking differently: Take 0.6 mg by mouth 2 (two) times daily. )  . cyclobenzaprine (FLEXERIL) 5 MG tablet Take 5 mg by mouth 3 (three) times daily as needed.  . dorzolamide-timolol (COSOPT) 22.3-6.8 MG/ML ophthalmic solution Place 1 drop into the left eye 2 (two) times daily.   . DULoxetine (CYMBALTA) 60 MG capsule Take 1 capsule (60 mg total) by mouth daily.  Marland Kitchen EPINEPHrine 0.3 mg/0.3 mL IJ SOAJ injection Inject 0.3 mLs (0.3 mg total) into the muscle as needed (allergic reaction).  . furosemide (LASIX) 40 MG tablet TAKE 1 TABLET(40 MG) BY MOUTH DAILY  . hydrocortisone (CORTEF) 10 MG tablet TAKE 2 TABLETS BY MOUTH EVERY MORNING AND 1 TABLET AT 5 PM EVERY DAY  .  levothyroxine (SYNTHROID) 137 MCG tablet TAKE 2 TABLETS BY MOUTH  DAILY BEFORE BREAKFAST  . lidocaine (LIDODERM) 5 % APPLY 1 PATCH TO SKIN EVERY DAY. REMOVE AND DISCARD AFTER 12 HOURS OR AS DIRECTED BY MD  . Multiple Vitamin (MULTIVITAMIN WITH MINERALS) TABS tablet Take 1 tablet by mouth daily.  Marland Kitchen NEEDLE, DISP, 22 G 22G X 1" MISC Use every 14 days  . oxyCODONE-acetaminophen (PERCOCET) 10-325 MG tablet Take 1 tablet by mouth every 4 (four) hours as needed for pain.  . OXYCONTIN 20 MG 12 hr tablet 27 mg.  . potassium chloride (K-DUR) 10 MEQ tablet Take 1 tablet (10 mEq total) by mouth daily.  . rosuvastatin (CRESTOR) 20 MG tablet Take 1 tablet (20 mg total) by mouth daily.  . Syringe/Needle, Disp, (SYRINGE 3CC/21GX1") 21G X 1" 3 ML MISC Use every 14 days  . testosterone cypionate (DEPO-TESTOSTERONE) 200 MG/ML injection Inject 0.6 mLs (120 mg total) into the muscle every 14 (fourteen) days.  Marland Kitchen topiramate (TOPAMAX) 50 MG tablet TAKE 1 TABLET BY MOUTH  TWICE DAILY   No facility-administered encounter medications on file as of 04/21/2020.     Dennis Jeffords Jenetta Downer, NP

## 2020-04-24 ENCOUNTER — Other Ambulatory Visit: Payer: Self-pay | Admitting: Family Medicine

## 2020-04-24 DIAGNOSIS — Z91030 Bee allergy status: Secondary | ICD-10-CM

## 2020-04-24 MED ORDER — EPINEPHRINE 0.3 MG/0.3ML IJ SOAJ: 0.3000 mg | INTRAMUSCULAR | 1 refills | Status: DC | PRN

## 2020-04-28 MED ORDER — EPINEPHRINE 0.3 MG/0.3ML IJ SOAJ: 0.3000 mg | INTRAMUSCULAR | 1 refills | Status: DC | PRN

## 2020-04-28 NOTE — Addendum Note (Signed)
Addended by: Kem Parkinson on: 04/28/2020 10:57 AM   Modules accepted: Orders

## 2020-04-30 ENCOUNTER — Other Ambulatory Visit: Payer: Self-pay

## 2020-04-30 ENCOUNTER — Other Ambulatory Visit (INDEPENDENT_AMBULATORY_CARE_PROVIDER_SITE_OTHER): Payer: Medicare Other

## 2020-04-30 DIAGNOSIS — E039 Hypothyroidism, unspecified: Secondary | ICD-10-CM

## 2020-04-30 DIAGNOSIS — E230 Hypopituitarism: Secondary | ICD-10-CM

## 2020-04-30 LAB — T4, FREE: Free T4: 1.21 ng/dL (ref 0.60–1.60)

## 2020-04-30 LAB — CBC
HCT: 43.8 % (ref 39.0–52.0)
Hemoglobin: 14.7 g/dL (ref 13.0–17.0)
MCHC: 33.5 g/dL (ref 30.0–36.0)
MCV: 85.6 fl (ref 78.0–100.0)
Platelets: 160 10*3/uL (ref 150.0–400.0)
RBC: 5.11 Mil/uL (ref 4.22–5.81)
RDW: 13.7 % (ref 11.5–15.5)
WBC: 4.6 10*3/uL (ref 4.0–10.5)

## 2020-04-30 LAB — TSH: TSH: 1.06 u[IU]/mL (ref 0.35–4.50)

## 2020-04-30 LAB — TESTOSTERONE: Testosterone: 514.2 ng/dL (ref 300.00–890.00)

## 2020-05-02 ENCOUNTER — Ambulatory Visit (INDEPENDENT_AMBULATORY_CARE_PROVIDER_SITE_OTHER): Payer: Medicare Other

## 2020-05-02 ENCOUNTER — Other Ambulatory Visit: Payer: Self-pay

## 2020-05-02 DIAGNOSIS — Z23 Encounter for immunization: Secondary | ICD-10-CM

## 2020-05-04 ENCOUNTER — Telehealth: Payer: Self-pay

## 2020-05-04 ENCOUNTER — Encounter: Payer: Self-pay | Admitting: Family Medicine

## 2020-05-04 NOTE — Telephone Encounter (Signed)
Pt had flu shot on 05/02/20 and pt wants to know if due for pneumonia shot. Per immunization list pt had prevnar 13 on 06/10/2014 and pneumovax on 10/05/2016; advised pt per present guidelines pt is up to date on prevnar 13 and next pneumovax is not due until 2013. Pt voice understanding and pt having problems with My chart and I gave pt [phone] for assistance with mychart. FYI to Glenda Chroman FNP.

## 2020-05-04 NOTE — Telephone Encounter (Signed)
Noted. Pneumococcal 23 can be given 2023.

## 2020-05-05 ENCOUNTER — Other Ambulatory Visit: Payer: Self-pay

## 2020-05-05 ENCOUNTER — Telehealth (INDEPENDENT_AMBULATORY_CARE_PROVIDER_SITE_OTHER): Payer: Medicare Other | Admitting: Endocrinology

## 2020-05-05 ENCOUNTER — Other Ambulatory Visit: Payer: Self-pay | Admitting: *Deleted

## 2020-05-05 ENCOUNTER — Encounter: Payer: Self-pay | Admitting: Endocrinology

## 2020-05-05 VITALS — Wt 370.0 lb

## 2020-05-05 DIAGNOSIS — E230 Hypopituitarism: Secondary | ICD-10-CM | POA: Diagnosis not present

## 2020-05-05 DIAGNOSIS — I6340 Cerebral infarction due to embolism of unspecified cerebral artery: Secondary | ICD-10-CM | POA: Diagnosis not present

## 2020-05-05 DIAGNOSIS — E039 Hypothyroidism, unspecified: Secondary | ICD-10-CM

## 2020-05-05 MED ORDER — LEVOTHYROXINE SODIUM 137 MCG PO TABS: ORAL_TABLET | ORAL | 1 refills | Status: DC

## 2020-05-05 MED ORDER — HYDROCORTISONE 10 MG PO TABS: ORAL_TABLET | ORAL | 1 refills | Status: DC
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
## 2020-05-06 ENCOUNTER — Other Ambulatory Visit: Payer: Medicare Other | Admitting: Adult Health Nurse Practitioner

## 2020-05-06 ENCOUNTER — Other Ambulatory Visit: Payer: Self-pay | Admitting: Family Medicine

## 2020-05-06 ENCOUNTER — Other Ambulatory Visit: Payer: Self-pay

## 2020-05-06 ENCOUNTER — Encounter: Payer: Self-pay | Admitting: Family Medicine

## 2020-05-06 DIAGNOSIS — G20 Parkinson's disease: Secondary | ICD-10-CM | POA: Diagnosis not present

## 2020-05-06 DIAGNOSIS — Z515 Encounter for palliative care: Secondary | ICD-10-CM

## 2020-05-06 DIAGNOSIS — M5442 Lumbago with sciatica, left side: Secondary | ICD-10-CM | POA: Diagnosis not present

## 2020-05-06 DIAGNOSIS — G8929 Other chronic pain: Secondary | ICD-10-CM

## 2020-05-06 DIAGNOSIS — R531 Weakness: Secondary | ICD-10-CM

## 2020-05-06 NOTE — Progress Notes (Addendum)
[place] Consult Note Telephone: [phone]  Fax: [phone]  PATIENT NAME: Dennis Freel Sr. DOB: 08-Mar-1946 MRN: [id number]  PRIMARY CARE PROVIDER:   [name], FNP  REFERRING PROVIDER:  [name], [place],  [place] [address]  RESPONSIBLE PARTY:   Rana Adorno, wife [phone]   RECOMMENDATIONS and PLAN: 1.Advanced care planning. Patient is DNR.  2.  Pain.  Patient getting better relief with taking flexeril and using lidocaine patches.  Taking Xtampza 27 mg BID and oxycodone 10 mg Q 4hours PRN.  States taking the oxycodone every 4 hours but one month supply has lasted about 6 weeks.Still has more pain and tremors in the afternoon.  Needs refill on Xtampza.  Will increase Xtampza to 36 mg BID to see if we can reduce amount of breakthrough oxycodone he is taking.  [place] and the Xtampza was going to be too expensive. Will try Morphine extended release 15 mg every 8 hours.  This is much cheaper.  Will call in 2 days to check effectiveness and if need titration.  Check  substance abuse data base with no concerns.  Have also encouraged starting PT to help with weakness and deconditioning.  Patient does have difficulty going outside the home.  Spoke with in patient neuro rehab that he was referred to a year ago and they have recommended [facility].  Have reached out to PCP with this recommendation.  Palliative will continue to monitor for symptom management/decline and make recommendations as needed.  Will set up next appointment when call for follow up on pain.  Patient and wife encouraged to call with any questions or concerns.  I spent 40 minutes providing this consultation,  from 2:00 to 2:40 including time spent with patient/family, chart review, provider coordination, documentation. More than 50% of the time in this  consultation was spent coordinating communication.   HISTORY OF PRESENT ILLNESS:  Dennis Dorrance Desroches Sr. is a 74 y.o. year old male with multiple medical problems including Parkinson's disease, CHF, Addison's disease, CVA, MI, sleep apnea, hypertension, hyperlipidemia, hypothyroidism, gout, history of testicular cancer and prostate cancer. Palliative Care was asked to help address goals of care.   CODE STATUS: DNR  PPS: 40% HOSPICE ELIGIBILITY/DIAGNOSIS: TBD  PHYSICAL EXAM:  General:Patient does have slowed movement and has noted discomfort with movement Extremities:1-2+edema to bilateral feet, ankles, and lower legs with more noted to right foot Skin: no rasheson exposed skin Neurological: Weakness but otherwise nonfocal  PAST MEDICAL HISTORY:  Past Medical History:  Diagnosis Date  . Addison's disease ([place]) 01/2016  . Arthritis    low back - DDD  . Cataract 2019   corrected with surgery  . Cellulitis, scrotum 08/02/2014  . Chronic diastolic CHF (congestive heart failure) ([place])    Echo 12/18: severe LVH, EF 60-65, Gr 1 DD // Echo 5/18: EF 65-70, Gr 1 DD  . Chronic lower back pain    "from [place] 2007"  . Complication of anesthesia    Sometimes has N&V /w anesth.   . Coronary artery disease    NSTEMI >> LHC 9/01: prox and mid LAD 50-70; mRCA 40 >> med Rx // Nuc 8/13 [facility]):  no infarct or ischemia, EF 59  . Elevated PSA   . Epididymitis, left 08/04/2014  . History of chronic bronchitis   . History of gout   . History of stroke  01/22/2016   2004 - "right brain stem; no residual " // 2017  . Hypertension   . Hypocholesteremia   . Hypothyroidism   . Infection of skin due to methicillin resistant Staphylococcus aureus (MRSA) 12/28/2017  . Kidney stone   . Myocardial infarction [facility]) 2001   2001- cardiac cath., cardiac clearanece note dr Otho Perl 05-14-13 on chart, stress test results 02-21-12 on chart  . OSA on CPAP    cpap setting of 10  . Parkinson's disease ([place])    stage 4    . Pneumonia 2000's and 2013  . PONV (postoperative nausea and vomiting)   . [facility] spotted fever   . Testicular cancer ([place]) 2015    SOCIAL HX:  Social History   Tobacco Use  . Smoking status: Never Smoker  . Smokeless tobacco: Never Used  Substance Use Topics  . Alcohol use: Not Currently    ALLERGIES:  Allergies  Allergen Reactions  . Bee Venom Anaphylaxis  . Shrimp [Shellfish Allergy] Anaphylaxis and Other (See Comments)    "just the protein in the shrimp"  . Stadol [Butorphanol] Anaphylaxis, Shortness Of Breath and Other (See Comments)    Respiratory distress, couldn't breathe, cardiac arrest  . Wasp Venom Anaphylaxis     PERTINENT MEDICATIONS:  Outpatient Encounter Medications as of 05/06/2020  Medication Sig  . allopurinol (ZYLOPRIM) 300 MG tablet Take 450 mg by mouth daily.   Marland Kitchen apixaban (ELIQUIS) 5 MG TABS tablet Take 1 tablet (5 mg total) by mouth 2 (two) times daily.  . carbidopa-levodopa (SINEMET IR) 25-250 MG tablet TAKE 2 TABLETS BY MOUTH 4  TIMES DAILY  . cephALEXin (KEFLEX) 500 MG capsule Take 1 capsule (500 mg total) by mouth 3 (three) times daily.  . colchicine 0.6 MG tablet TAKE 1 TABLET BY MOUTH TWICE DAILY (Patient taking differently: Take 0.6 mg by mouth 2 (two) times daily. )  . cyclobenzaprine (FLEXERIL) 5 MG tablet Take 5 mg by mouth 3 (three) times daily as needed.  . dorzolamide-timolol (COSOPT) 22.3-6.8 MG/ML ophthalmic solution Place 1 drop into the left eye 2 (two) times daily.  (Patient not taking: Reported on 05/05/2020)  . DULoxetine (CYMBALTA) 60 MG capsule Take 1 capsule (60 mg total) by mouth daily.  Marland Kitchen EPINEPHrine 0.3 mg/0.3 mL IJ SOAJ injection Inject 0.3 mg into the muscle as needed (allergic reaction).  . furosemide (LASIX) 40 MG tablet TAKE 1 TABLET(40 MG) BY MOUTH DAILY  . hydrocortisone (CORTEF) 10 MG tablet TAKE 2 TABLETS BY MOUTH EVERY MORNING AND 1 TABLET AT 5 PM EVERY DAY  . levothyroxine (SYNTHROID) 137 MCG tablet TAKE 2  TABLETS BY MOUTH  DAILY BEFORE BREAKFAST AND TAKE 1 EXTRA TABLET WEEKLY  . lidocaine (LIDODERM) 5 % APPLY 1 PATCH TO SKIN EVERY DAY. REMOVE AND DISCARD AFTER 12 HOURS OR AS DIRECTED BY MD  . Multiple Vitamin (MULTIVITAMIN WITH MINERALS) TABS tablet Take 1 tablet by mouth daily.  Marland Kitchen NEEDLE, DISP, 22 G 22G X 1" MISC Use every 14 days  . oxyCODONE-acetaminophen (PERCOCET) 10-325 MG tablet Take 1 tablet by mouth every 4 (four) hours as needed for pain.  . OXYCONTIN 20 MG 12 hr tablet 27 mg.  . potassium chloride (K-DUR) 10 MEQ tablet Take 1 tablet (10 mEq total) by mouth daily.  . rosuvastatin (CRESTOR) 20 MG tablet Take 1 tablet (20 mg total) by mouth daily. (Patient not taking: Reported on 05/05/2020)  . Syringe/Needle, Disp, (SYRINGE 3CC/21GX1") 21G X 1" 3 ML MISC Use every 14 days  .  testosterone cypionate (DEPO-TESTOSTERONE) 200 MG/ML injection Inject 0.6 mLs (120 mg total) into the muscle every 14 (fourteen) days.  Marland Kitchen topiramate (TOPAMAX) 50 MG tablet TAKE 1 TABLET BY MOUTH  TWICE DAILY   No facility-administered encounter medications on file as of 05/06/2020.     Ailie Gage Jenetta Downer, NP

## 2020-05-06 NOTE — Telephone Encounter (Signed)
Please see patient's note regarding physical therapy.  I put in a referral on 05/06/2020 for home PT.  It looks like he needs to have special neuro PT at home and this was previously done by [facility] with someone out of [place].

## 2020-05-08 ENCOUNTER — Telehealth: Payer: Self-pay | Admitting: Adult Health Nurse Practitioner

## 2020-05-08 NOTE — Telephone Encounter (Signed)
Spoke with patient about change to Morphine ER 15 mg TID. He is having increased pain and stating that the morphine isn't touching his pain.  Have advised to increase to 2 tabs (30mg ) every 8 hours.  Set up appointment for Monday, 05/11/20, at 2pm for reevaluation. Bracy Pepper K. Olena Heckle NP

## 2020-05-11 ENCOUNTER — Other Ambulatory Visit: Payer: Self-pay

## 2020-05-11 ENCOUNTER — Other Ambulatory Visit: Payer: Medicare Other | Admitting: Adult Health Nurse Practitioner

## 2020-05-11 DIAGNOSIS — M5442 Lumbago with sciatica, left side: Secondary | ICD-10-CM

## 2020-05-11 DIAGNOSIS — G8929 Other chronic pain: Secondary | ICD-10-CM

## 2020-05-11 DIAGNOSIS — Z515 Encounter for palliative care: Secondary | ICD-10-CM

## 2020-05-11 NOTE — Progress Notes (Signed)
[place] Consult Note Telephone: [phone]  Fax: [phone]  PATIENT NAME: Dennis Amble Sr. DOB: 03-04-1946 MRN: [id number]  PRIMARY CARE PROVIDER:   [name], FNP  REFERRING PROVIDER:  [name], [place],   [id number]  RESPONSIBLE PARTY:  Heraclio Seidman, wife [phone]   RECOMMENDATIONS and PLAN: 1.Advanced care planning. Patient is DNR.  2.  Pain.  Switched from [place] to oral morphine ER at last visit due to patient being in doughnut hole and Xtampza was going to cost too much.  Morphine ER was cheaper and started 15 mg TID.  This was not touching the pain and increased to 2 tabs (30 mg TID).  This was not effective and patient was still having to take oxycodone 10 mg Q4 hours.  Tried 3 tabs (45mg ) of the ER morphine last night and had relief.  Was able to relax and sleep well with the Morphine ER 15 mg 3 tabs.  Took just 2 tabs this morning and states that he feels better relief with the 3 tabs without increased drowsiness or nausea.  Sent in escript for Morphine ER tabs 15 mg take 3 tabs Q 8 hours.  This is above the recommended daily dosage of 90 mg and discussed pros and cons and quality of life with patient.  Would like to proceed with the Morphine ER 15 mg 3 tabs as this has given him good relief.  Patient has severe chronic pain and has been having to use higher doses of oxycodone and has of yet to get adequate pain relief, especially in the evenings when it gets worse the more tired he gets.    Palliative will continue to monitor for symptom management/decline and make recommendations as needed. Next visit is in 2 weeks and will follow up in a couple days via phone.   Of note, patient called stating that insurance has form for this provider to fill out due to the increased dosage of morphine.  Will await the form and fill out and send back to insurance company once  received.  I spent 30 minutes providing this consultation,  from 12:25 to 12:55 including time spent with patient/family, chart review, provider coordination, documentation. More than 50% of the time in this consultation was spent coordinating communication.   HISTORY OF PRESENT ILLNESS:  Dennis Brissett Jansson Sr. is a 74 y.o. year old male with multiple medical problems including Parkinson's disease, CHF, Addison's disease, CVA, MI, sleep apnea, hypertension, hyperlipidemia, hypothyroidism, gout, history of testicular cancer and prostate cancer. Palliative Care was asked to help address goals of care.   CODE STATUS: DNR  PPS: 40% HOSPICE ELIGIBILITY/DIAGNOSIS: TBD  PHYSICAL EXAM:  General:Patient does have slowed movementand has noted discomfort with movement Extremities:1-2+edema to bilateral feet, ankles, and lower legs with more noted to right foot Skin: no rasheson exposed skin Neurological: Weakness but otherwise nonfocal  PAST MEDICAL HISTORY:  Past Medical History:  Diagnosis Date  . Addison's disease ([place]) 01/2016  . Arthritis    low back - DDD  . Cataract 2019   corrected with surgery  . Cellulitis, scrotum 08/02/2014  . Chronic diastolic CHF (congestive heart failure) ([place])    Echo 12/18: severe LVH, EF 60-65, Gr 1 DD // Echo 5/18: EF 65-70, Gr 1 DD  . Chronic lower back pain    "from [place] 2007"  . Complication of anesthesia    Sometimes has N&V /w anesth.   Marland Kitchen  Coronary artery disease    NSTEMI >> LHC 9/01: prox and mid LAD 50-70; mRCA 40 >> med Rx // Nuc 8/13 [facility]):  no infarct or ischemia, EF 59  . Elevated PSA   . Epididymitis, left 08/04/2014  . History of chronic bronchitis   . History of gout   . History of stroke 01/22/2016   2004 - "right brain stem; no residual " // 2017  . Hypertension   . Hypocholesteremia   . Hypothyroidism   . Infection of skin due to methicillin resistant Staphylococcus aureus (MRSA) 12/28/2017  . Kidney stone   . Myocardial  infarction [facility]) 2001   2001- cardiac cath., cardiac clearanece note dr Otho Perl 05-14-13 on chart, stress test results 02-21-12 on chart  . OSA on CPAP    cpap setting of 10  . Parkinson's disease ([place])    stage 4   . Pneumonia 2000's and 2013  . PONV (postoperative nausea and vomiting)   . [facility] spotted fever   . Testicular cancer ([place]) 2015    SOCIAL HX:  Social History   Tobacco Use  . Smoking status: Never Smoker  . Smokeless tobacco: Never Used  Substance Use Topics  . Alcohol use: Not Currently    ALLERGIES:  Allergies  Allergen Reactions  . Bee Venom Anaphylaxis  . Shrimp [Shellfish Allergy] Anaphylaxis and Other (See Comments)    "just the protein in the shrimp"  . Stadol [Butorphanol] Anaphylaxis, Shortness Of Breath and Other (See Comments)    Respiratory distress, couldn't breathe, cardiac arrest  . Wasp Venom Anaphylaxis     PERTINENT MEDICATIONS:  Outpatient Encounter Medications as of 05/11/2020  Medication Sig  . allopurinol (ZYLOPRIM) 300 MG tablet Take 450 mg by mouth daily.   Marland Kitchen apixaban (ELIQUIS) 5 MG TABS tablet Take 1 tablet (5 mg total) by mouth 2 (two) times daily.  . carbidopa-levodopa (SINEMET IR) 25-250 MG tablet TAKE 2 TABLETS BY MOUTH 4  TIMES DAILY  . cephALEXin (KEFLEX) 500 MG capsule Take 1 capsule (500 mg total) by mouth 3 (three) times daily.  . colchicine 0.6 MG tablet TAKE 1 TABLET BY MOUTH TWICE DAILY (Patient taking differently: Take 0.6 mg by mouth 2 (two) times daily. )  . cyclobenzaprine (FLEXERIL) 5 MG tablet Take 5 mg by mouth 3 (three) times daily as needed.  . dorzolamide-timolol (COSOPT) 22.3-6.8 MG/ML ophthalmic solution Place 1 drop into the left eye 2 (two) times daily.  (Patient not taking: Reported on 05/05/2020)  . DULoxetine (CYMBALTA) 60 MG capsule Take 1 capsule (60 mg total) by mouth daily.  Marland Kitchen EPINEPHrine 0.3 mg/0.3 mL IJ SOAJ injection Inject 0.3 mg into the muscle as needed (allergic reaction).  . furosemide (LASIX)  40 MG tablet TAKE 1 TABLET(40 MG) BY MOUTH DAILY  . hydrocortisone (CORTEF) 10 MG tablet TAKE 2 TABLETS BY MOUTH EVERY MORNING AND 1 TABLET AT 5 PM EVERY DAY  . levothyroxine (SYNTHROID) 137 MCG tablet TAKE 2 TABLETS BY MOUTH  DAILY BEFORE BREAKFAST AND TAKE 1 EXTRA TABLET WEEKLY  . lidocaine (LIDODERM) 5 % APPLY 1 PATCH TO SKIN EVERY DAY. REMOVE AND DISCARD AFTER 12 HOURS OR AS DIRECTED BY MD  . Multiple Vitamin (MULTIVITAMIN WITH MINERALS) TABS tablet Take 1 tablet by mouth daily.  Marland Kitchen NEEDLE, DISP, 22 G 22G X 1" MISC Use every 14 days  . oxyCODONE-acetaminophen (PERCOCET) 10-325 MG tablet Take 1 tablet by mouth every 4 (four) hours as needed for pain.  . OXYCONTIN 20 MG 12  hr tablet 27 mg.  . potassium chloride (K-DUR) 10 MEQ tablet Take 1 tablet (10 mEq total) by mouth daily.  . rosuvastatin (CRESTOR) 20 MG tablet Take 1 tablet (20 mg total) by mouth daily. (Patient not taking: Reported on 05/05/2020)  . Syringe/Needle, Disp, (SYRINGE 3CC/21GX1") 21G X 1" 3 ML MISC Use every 14 days  . testosterone cypionate (DEPO-TESTOSTERONE) 200 MG/ML injection Inject 0.6 mLs (120 mg total) into the muscle every 14 (fourteen) days.  Marland Kitchen topiramate (TOPAMAX) 50 MG tablet TAKE 1 TABLET BY MOUTH  TWICE DAILY   No facility-administered encounter medications on file as of 05/11/2020.     Frayda Egley Jenetta Downer, NP

## 2020-05-12 ENCOUNTER — Encounter: Payer: Self-pay | Admitting: Family Medicine

## 2020-05-12 DIAGNOSIS — G8929 Other chronic pain: Secondary | ICD-10-CM | POA: Diagnosis not present

## 2020-05-12 DIAGNOSIS — M5442 Lumbago with sciatica, left side: Secondary | ICD-10-CM | POA: Diagnosis not present

## 2020-05-12 DIAGNOSIS — Z515 Encounter for palliative care: Secondary | ICD-10-CM | POA: Diagnosis not present

## 2020-05-12 DIAGNOSIS — M5441 Lumbago with sciatica, right side: Secondary | ICD-10-CM | POA: Diagnosis not present

## 2020-05-13 ENCOUNTER — Telehealth: Payer: Self-pay | Admitting: Family Medicine

## 2020-05-13 NOTE — Telephone Encounter (Signed)
Called and spoke with PT at [facility], will have OT evaluate patient and, if needed, social work.

## 2020-05-13 NOTE — Telephone Encounter (Signed)
[name] called again and stated she is needing to verify that these services are not due to a liability claim. Need to verify before they see patient as they are scheduled to see him today at 10:30. Please advise and call back at [phone].

## 2020-05-13 NOTE — Telephone Encounter (Signed)
Called and left message for Dennis [name] that PT is not part of liability claim.

## 2020-05-13 NOTE — Telephone Encounter (Signed)
Called and spoke with patient's wife, [name]. She reports that it is becoming more difficult to care for patient at home, he has become very weak, has difficulty with any ADL- bathing, shaving, toileting. I have tried to get in touch with [facility] to discuss and see about adding social work, OT consults. Will try to contact later this afternoon.

## 2020-05-13 NOTE — Telephone Encounter (Signed)
[name] with [facility] called in stating patient will be seen by PT there and that they are neuro trained as well. Sending to PCP as FYI.

## 2020-05-13 NOTE — Telephone Encounter (Signed)
[name] (spouse) called wanting Debbie to call her regarding pt .  She is very tearful . She stated she wanted to talk about putting pt in faculity.  She also stated she sent debbie my chart message but she used her my chart not pt .

## 2020-05-14 ENCOUNTER — Telehealth: Payer: Self-pay

## 2020-05-14 ENCOUNTER — Telehealth: Payer: Self-pay | Admitting: Adult Health Nurse Practitioner

## 2020-05-14 DIAGNOSIS — G909 Disorder of the autonomic nervous system, unspecified: Secondary | ICD-10-CM | POA: Diagnosis not present

## 2020-05-14 DIAGNOSIS — Z96652 Presence of left artificial knee joint: Secondary | ICD-10-CM | POA: Diagnosis not present

## 2020-05-14 DIAGNOSIS — I5032 Chronic diastolic (congestive) heart failure: Secondary | ICD-10-CM | POA: Diagnosis not present

## 2020-05-14 DIAGNOSIS — M47816 Spondylosis without myelopathy or radiculopathy, lumbar region: Secondary | ICD-10-CM | POA: Diagnosis not present

## 2020-05-14 DIAGNOSIS — M5116 Intervertebral disc disorders with radiculopathy, lumbar region: Secondary | ICD-10-CM | POA: Diagnosis not present

## 2020-05-14 DIAGNOSIS — I252 Old myocardial infarction: Secondary | ICD-10-CM | POA: Diagnosis not present

## 2020-05-14 DIAGNOSIS — M109 Gout, unspecified: Secondary | ICD-10-CM | POA: Diagnosis not present

## 2020-05-14 DIAGNOSIS — E271 Primary adrenocortical insufficiency: Secondary | ICD-10-CM | POA: Diagnosis not present

## 2020-05-14 DIAGNOSIS — I872 Venous insufficiency (chronic) (peripheral): Secondary | ICD-10-CM | POA: Diagnosis not present

## 2020-05-14 DIAGNOSIS — I110 Hypertensive heart disease with heart failure: Secondary | ICD-10-CM | POA: Diagnosis not present

## 2020-05-14 DIAGNOSIS — E039 Hypothyroidism, unspecified: Secondary | ICD-10-CM | POA: Diagnosis not present

## 2020-05-14 DIAGNOSIS — Z7901 Long term (current) use of anticoagulants: Secondary | ICD-10-CM | POA: Diagnosis not present

## 2020-05-14 DIAGNOSIS — J42 Unspecified chronic bronchitis: Secondary | ICD-10-CM | POA: Diagnosis not present

## 2020-05-14 DIAGNOSIS — G8929 Other chronic pain: Secondary | ICD-10-CM | POA: Diagnosis not present

## 2020-05-14 DIAGNOSIS — Z8673 Personal history of transient ischemic attack (TIA), and cerebral infarction without residual deficits: Secondary | ICD-10-CM | POA: Diagnosis not present

## 2020-05-14 DIAGNOSIS — I2510 Atherosclerotic heart disease of native coronary artery without angina pectoris: Secondary | ICD-10-CM | POA: Diagnosis not present

## 2020-05-14 DIAGNOSIS — E230 Hypopituitarism: Secondary | ICD-10-CM | POA: Diagnosis not present

## 2020-05-14 DIAGNOSIS — F32A Depression, unspecified: Secondary | ICD-10-CM | POA: Diagnosis not present

## 2020-05-14 DIAGNOSIS — G4733 Obstructive sleep apnea (adult) (pediatric): Secondary | ICD-10-CM | POA: Diagnosis not present

## 2020-05-14 DIAGNOSIS — Z6841 Body Mass Index (BMI) 40.0 and over, adult: Secondary | ICD-10-CM | POA: Diagnosis not present

## 2020-05-14 DIAGNOSIS — E7800 Pure hypercholesterolemia, unspecified: Secondary | ICD-10-CM | POA: Diagnosis not present

## 2020-05-14 DIAGNOSIS — Z8547 Personal history of malignant neoplasm of testis: Secondary | ICD-10-CM | POA: Diagnosis not present

## 2020-05-14 DIAGNOSIS — G20 Parkinson's disease: Secondary | ICD-10-CM | POA: Diagnosis not present

## 2020-05-14 DIAGNOSIS — Z981 Arthrodesis status: Secondary | ICD-10-CM | POA: Diagnosis not present

## 2020-05-14 NOTE — Telephone Encounter (Signed)
This is a late entry.  Spoke with patient yesterday and he was able to get his medication filled and approved by insurance.  Encouraged to call with any concerns Akansha Wyche K. Olena Heckle NP

## 2020-05-14 NOTE — Telephone Encounter (Signed)
PT eval start of care done. Would like verbal orders for PT 2 x week 4 weeks then 1 x week f or 4 week.

## 2020-05-15 ENCOUNTER — Other Ambulatory Visit: Payer: Self-pay | Admitting: Family Medicine

## 2020-05-15 ENCOUNTER — Telehealth: Payer: Self-pay

## 2020-05-15 DIAGNOSIS — I110 Hypertensive heart disease with heart failure: Secondary | ICD-10-CM | POA: Diagnosis not present

## 2020-05-15 DIAGNOSIS — G8929 Other chronic pain: Secondary | ICD-10-CM | POA: Diagnosis not present

## 2020-05-15 DIAGNOSIS — M47816 Spondylosis without myelopathy or radiculopathy, lumbar region: Secondary | ICD-10-CM | POA: Diagnosis not present

## 2020-05-15 DIAGNOSIS — M5116 Intervertebral disc disorders with radiculopathy, lumbar region: Secondary | ICD-10-CM | POA: Diagnosis not present

## 2020-05-15 DIAGNOSIS — I5032 Chronic diastolic (congestive) heart failure: Secondary | ICD-10-CM | POA: Diagnosis not present

## 2020-05-15 DIAGNOSIS — G20 Parkinson's disease: Secondary | ICD-10-CM | POA: Diagnosis not present

## 2020-05-15 NOTE — Telephone Encounter (Signed)
Called and left message to ok verbal orders for PT as recommended.

## 2020-05-15 NOTE — Telephone Encounter (Signed)
Leanne OT with [facility] left v/m that pt OT eval was done and pts son was present during the evaluation; discussed ambulation, mobility,personal care and safety. When Leanne OT arrived pt was in pain at pain level 10; after OT eval pts pain level was 8 and pt did take oxycodone. In v/m Leanne said will move forward with OT services and Leanne did not request cb with request for OT [facility] orders. Sending note to Glenda Chroman FNP.

## 2020-05-16 ENCOUNTER — Other Ambulatory Visit: Payer: Self-pay | Admitting: Family Medicine

## 2020-05-18 ENCOUNTER — Telehealth: Payer: Self-pay | Admitting: Family Medicine

## 2020-05-18 NOTE — Telephone Encounter (Signed)
Last office visit faxed to [facility] ([place])

## 2020-05-18 NOTE — Telephone Encounter (Signed)
Dennis [name] needs last office note faxed to  [phone]

## 2020-05-18 NOTE — Telephone Encounter (Signed)
Noted  

## 2020-05-19 ENCOUNTER — Encounter: Payer: Self-pay | Admitting: Family Medicine

## 2020-05-19 DIAGNOSIS — Z23 Encounter for immunization: Secondary | ICD-10-CM | POA: Diagnosis not present

## 2020-05-21 ENCOUNTER — Telehealth: Payer: Self-pay

## 2020-05-21 NOTE — Telephone Encounter (Signed)
Written order ppw placed in folder to be scanned.

## 2020-05-21 NOTE — Telephone Encounter (Signed)
Detail written order PAP and Supplies faxed to Zeb Comfort at [facility]

## 2020-05-22 ENCOUNTER — Telehealth: Payer: Self-pay

## 2020-05-22 ENCOUNTER — Emergency Department (HOSPITAL_COMMUNITY)
Admission: EM | Admit: 2020-05-22 | Discharge: 2020-05-23 | Disposition: A | Discharge status: Home / Self Care | Payer: No Typology Code available for payment source | Attending: Emergency Medicine | Admitting: Emergency Medicine

## 2020-05-22 ENCOUNTER — Other Ambulatory Visit: Payer: Self-pay

## 2020-05-22 ENCOUNTER — Encounter (HOSPITAL_COMMUNITY): Payer: Self-pay | Admitting: Emergency Medicine

## 2020-05-22 ENCOUNTER — Emergency Department (HOSPITAL_COMMUNITY)
Admission: EM | Admit: 2020-05-22 | Discharge: 2020-05-22 | Discharge status: Absent without leave | Payer: Medicare Other

## 2020-05-22 ENCOUNTER — Emergency Department (HOSPITAL_COMMUNITY): Payer: No Typology Code available for payment source

## 2020-05-22 DIAGNOSIS — Z79899 Other long term (current) drug therapy: Secondary | ICD-10-CM | POA: Diagnosis not present

## 2020-05-22 DIAGNOSIS — N492 Inflammatory disorders of scrotum: Secondary | ICD-10-CM | POA: Insufficient documentation

## 2020-05-22 DIAGNOSIS — I110 Hypertensive heart disease with heart failure: Secondary | ICD-10-CM | POA: Diagnosis not present

## 2020-05-22 DIAGNOSIS — N451 Epididymitis: Secondary | ICD-10-CM | POA: Diagnosis not present

## 2020-05-22 DIAGNOSIS — N5089 Other specified disorders of the male genital organs: Secondary | ICD-10-CM

## 2020-05-22 DIAGNOSIS — N50811 Right testicular pain: Secondary | ICD-10-CM | POA: Diagnosis present

## 2020-05-22 DIAGNOSIS — I5032 Chronic diastolic (congestive) heart failure: Secondary | ICD-10-CM | POA: Insufficient documentation

## 2020-05-22 DIAGNOSIS — N503 Cyst of epididymis: Secondary | ICD-10-CM | POA: Diagnosis not present

## 2020-05-22 DIAGNOSIS — N433 Hydrocele, unspecified: Secondary | ICD-10-CM | POA: Diagnosis not present

## 2020-05-22 LAB — COMPREHENSIVE METABOLIC PANEL
ALT: 17 U/L (ref 0–44)
AST: 21 U/L (ref 15–41)
Albumin: 4 g/dL (ref 3.5–5.0)
Alkaline Phosphatase: 49 U/L (ref 38–126)
Anion gap: 10 (ref 5–15)
BUN: 7 mg/dL — ABNORMAL LOW (ref 8–23)
CO2: 26 mmol/L (ref 22–32)
Calcium: 9.1 mg/dL (ref 8.9–10.3)
Chloride: 98 mmol/L (ref 98–111)
Creatinine, Ser: 1.14 mg/dL (ref 0.61–1.24)
GFR, Estimated: >60 mL/min (ref >60)
Glucose, Bld: 115 mg/dL — ABNORMAL HIGH (ref 70–99)
Potassium: 3.9 mmol/L (ref 3.5–5.1)
Sodium: 134 mmol/L — ABNORMAL LOW (ref 135–145)
Total Bilirubin: 1.9 mg/dL — ABNORMAL HIGH (ref 0.3–1.2)
Total Protein: 7.6 g/dL (ref 6.5–8.1)

## 2020-05-22 LAB — CBC
HCT: 48.2 % (ref 39.0–52.0)
Hemoglobin: 15.5 g/dL (ref 13.0–17.0)
MCH: 27.8 pg (ref 26.0–34.0)
MCHC: 32.2 g/dL (ref 30.0–36.0)
MCV: 86.4 fL (ref 80.0–100.0)
Platelets: 174 10*3/uL (ref 150–400)
RBC: 5.58 MIL/uL (ref 4.22–5.81)
RDW: 13.7 % (ref 11.5–15.5)
WBC: 4.6 10*3/uL (ref 4.0–10.5)
nRBC: 0 % (ref 0.0–0.2)

## 2020-05-22 MED ORDER — LEVOFLOXACIN IN D5W 500 MG/100ML IV SOLN
500.0000 mg | Freq: Once | INTRAVENOUS | Status: AC
  Administered 2020-05-22: 500 mg via INTRAVENOUS
  Filled 2020-05-22: qty 100

## 2020-05-22 MED ORDER — LEVOFLOXACIN 500 MG PO TABS: 500.0000 mg | ORAL_TABLET | Freq: Every day | ORAL | 0 refills | Status: DC

## 2020-05-22 NOTE — Discharge Instructions (Signed)
Please read and follow all provided instructions.  Your diagnoses today include:  1. Scrotal swelling     Tests performed today include:  Blood counts and electrolytes  Ultrasound of the testicle - shows fluid in the scrotum, no severe infection  Vital signs. See below for your results today.   Medications prescribed:   Levofloxacin - antibiotic   You have been prescribed an antibiotic medicine: take the entire course of medicine even if you are feeling better. Stopping early can cause the antibiotic not to work.  Take any prescribed medications only as directed.  Home care instructions:  Follow any educational materials contained in this packet.  BE VERY CAREFUL not to take multiple medicines containing Tylenol (also called acetaminophen). Doing so can lead to an overdose which can damage your liver and cause liver failure and possibly death.   Follow-up instructions: Please follow-up with your urologist next week for recheck of your swelling.  Return instructions:   Please return to the Emergency Department if you experience worsening symptoms.   Please return if you have any other emergent concerns.  Additional Information:  Your vital signs today were: BP (!) 165/111 (BP Location: Left Arm)   Pulse 88   Temp 98.1 F (36.7 C) (Oral)   Resp 18   Ht 6\' 5"  (1.956 m)   Wt (!) 168 kg   SpO2 99%   BMI 43.92 kg/m  If your blood pressure (BP) was elevated above 135/85 this visit, please have this repeated by your doctor within one month. --------------
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
## 2020-05-22 NOTE — Telephone Encounter (Signed)
Dennis [name] OT with [facility] said pt cancelled OT visit today due to CA concerns; pt recently saw urologist. [name] does not need order to make up this visit. Tacey Heap FNP.

## 2020-05-22 NOTE — ED Triage Notes (Signed)
Patient complains of R testicle enlargement, to the size of 'a breakfast egg.' Sent by [facility] to get testicle looked at.

## 2020-05-23 ENCOUNTER — Encounter: Payer: Self-pay | Admitting: Family Medicine

## 2020-05-25 ENCOUNTER — Other Ambulatory Visit: Payer: Medicare Other | Admitting: Adult Health Nurse Practitioner

## 2020-05-25 ENCOUNTER — Other Ambulatory Visit: Payer: Self-pay

## 2020-05-25 DIAGNOSIS — Z515 Encounter for palliative care: Secondary | ICD-10-CM | POA: Diagnosis not present

## 2020-05-25 DIAGNOSIS — G8929 Other chronic pain: Secondary | ICD-10-CM | POA: Diagnosis not present

## 2020-05-25 DIAGNOSIS — M5441 Lumbago with sciatica, right side: Secondary | ICD-10-CM | POA: Diagnosis not present

## 2020-05-25 DIAGNOSIS — M5442 Lumbago with sciatica, left side: Secondary | ICD-10-CM | POA: Diagnosis not present

## 2020-05-25 NOTE — Progress Notes (Signed)
[facility] Consult Note Telephone: [phone]  Fax: [phone]  PATIENT NAME: Dennis Timothy Sr. DOB: 12/25/1945 MRN: [id number]  PRIMARY CARE PROVIDER:   [name], FNP  REFERRING PROVIDER:  [name], FNP [place],  [place] [address]  RESPONSIBLE PARTY:  [name], wife [phone]  Due to the COVID-19 crisis, this visit was done via telemedicine and it was initiated and consent by this patient and or family. Video-audio (telehealth) contact was unable to be done due to technical barriers from the patient's side. Patient changed this visit to telehealth as he and his wife are having a cough for past few days after getting COVID booster last week.     RECOMMENDATIONS and PLAN: 1.Advanced care planning. Patient is DNR.  2.  Pain.  Patient doing well with morphine ER 15 mg take 3 tabs Q8hrs.  States that he feels the pain is managed well.  Not having increased drowsiness.  Does have occasional constipation in which he is on laxatives for.  Has been taking less of the oxy 10 mg Q4hrs PRN for breakthrough pain.  He is due to run out of Morphine ER in 3 days.  Checked [place] substance abuse data base with no red flags.  Sent in escript for morphine ER 15 mg take 3 tabs Q 8 hrs for 2 weeks to [facility] in [place] on [address].  Will follow up next week in person.    Palliative will continue to monitor for symptom management/decline and make recommendations as needed.Next visit is in 1 week.  I spent 30 minutes providing this consultation,  from 10:00 to 10:30 including time spent with patient/family, chart review, provider coordination, documentation. More than 50% of the time in this consultation was spent coordinating communication.   HISTORY OF PRESENT ILLNESS:  Dennis Hemmelgarn Pipe Sr. is a 74 y.o. year old male with multiple medical problems including Parkinson's disease, CHF,  Addison's disease, CVA, MI, sleep apnea, hypertension, hyperlipidemia, hypothyroidism, gout, history of testicular cancer and prostate cancer. Palliative Care was asked to help address goals of care.   Patient seen in ER on 05/22/20 for swollen right testicle.  Found to have large hydrocele and started on levaquin for 5 days.  States that the pain has improved but the swelling has not gone down.  Has been having hot/cold flashes and coughing for past few days.  His wife also has these symptoms and believe it may be a reaction to the COVID booster they received last week.  Denies increased SOB or congestion, N/V/D, dysuria.    CODE STATUS: DNR  PPS: 40% HOSPICE ELIGIBILITY/DIAGNOSIS: TBD  PHYSICAL EXAM:   Deferred  PAST MEDICAL HISTORY:  Past Medical History:  Diagnosis Date  . Addison's disease ([place]) 01/2016  . Arthritis    low back - DDD  . Cataract 2019   corrected with surgery  . Cellulitis, scrotum 08/02/2014  . Chronic diastolic CHF (congestive heart failure) ([place])    Echo 12/18: severe LVH, EF 60-65, Gr 1 DD // Echo 5/18: EF 65-70, Gr 1 DD  . Chronic lower back pain    "from [place] 2007"  . Complication of anesthesia    Sometimes has N&V /w anesth.   . Coronary artery disease    NSTEMI >> LHC 9/01: prox and mid LAD 50-70; mRCA 40 >> med Rx // Nuc 8/13 [facility]):  no infarct or ischemia, EF 59  . Elevated PSA   .  Epididymitis, left 08/04/2014  . History of chronic bronchitis   . History of gout   . History of stroke 01/22/2016   2004 - "right brain stem; no residual " // 2017  . Hypertension   . Hypocholesteremia   . Hypothyroidism   . Infection of skin due to methicillin resistant Staphylococcus aureus (MRSA) 12/28/2017  . Kidney stone   . Myocardial infarction [facility]) 2001   2001- cardiac cath., cardiac clearanece note dr Otho Perl 05-14-13 on chart, stress test results 02-21-12 on chart  . OSA on CPAP    cpap setting of 10  . Parkinson's disease ([place])    stage 4   . Pneumonia  2000's and 2013  . PONV (postoperative nausea and vomiting)   . [facility] spotted fever   . Testicular cancer ([place]) 2015    SOCIAL HX:  Social History   Tobacco Use  . Smoking status: Never Smoker  . Smokeless tobacco: Never Used  Substance Use Topics  . Alcohol use: Not Currently    ALLERGIES:  Allergies  Allergen Reactions  . Bee Venom Anaphylaxis  . Shrimp [Shellfish Allergy] Anaphylaxis and Other (See Comments)    "just the protein in the shrimp"  . Stadol [Butorphanol] Anaphylaxis, Shortness Of Breath and Other (See Comments)    Respiratory distress, couldn't breathe, cardiac arrest  . Wasp Venom Anaphylaxis     PERTINENT MEDICATIONS:  Outpatient Encounter Medications as of 05/25/2020  Medication Sig  . allopurinol (ZYLOPRIM) 300 MG tablet Take 450 mg by mouth daily.   Marland Kitchen apixaban (ELIQUIS) 5 MG TABS tablet Take 1 tablet (5 mg total) by mouth 2 (two) times daily.  . carbidopa-levodopa (SINEMET IR) 25-250 MG tablet TAKE 2 TABLETS BY MOUTH 4  TIMES DAILY  . cephALEXin (KEFLEX) 500 MG capsule Take 1 capsule (500 mg total) by mouth 3 (three) times daily.  . colchicine 0.6 MG tablet TAKE 1 TABLET BY MOUTH TWICE DAILY (Patient taking differently: Take 0.6 mg by mouth 2 (two) times daily. )  . cyclobenzaprine (FLEXERIL) 5 MG tablet Take 5 mg by mouth 3 (three) times daily as needed.  . dorzolamide-timolol (COSOPT) 22.3-6.8 MG/ML ophthalmic solution Place 1 drop into the left eye 2 (two) times daily.  (Patient not taking: Reported on 05/05/2020)  . DULoxetine (CYMBALTA) 60 MG capsule Take 1 capsule (60 mg total) by mouth daily.  Marland Kitchen EPINEPHrine 0.3 mg/0.3 mL IJ SOAJ injection Inject 0.3 mg into the muscle as needed (allergic reaction).  . furosemide (LASIX) 40 MG tablet TAKE 1 TABLET(40 MG) BY MOUTH DAILY  . hydrocortisone (CORTEF) 10 MG tablet TAKE 2 TABLETS BY MOUTH EVERY MORNING AND 1 TABLET AT 5 PM EVERY DAY  . levofloxacin (LEVAQUIN) 500 MG tablet Take 1 tablet (500 mg  total) by mouth daily.  Marland Kitchen levothyroxine (SYNTHROID) 137 MCG tablet TAKE 2 TABLETS BY MOUTH  DAILY BEFORE BREAKFAST AND TAKE 1 EXTRA TABLET WEEKLY  . lidocaine (LIDODERM) 5 % APPLY 1 PATCH TO SKIN EVERY DAY. REMOVE AND DISCARD AFTER 12 HOURS OR AS DIRECTED BY MD  . Multiple Vitamin (MULTIVITAMIN WITH MINERALS) TABS tablet Take 1 tablet by mouth daily.  Marland Kitchen NEEDLE, DISP, 22 G 22G X 1" MISC Use every 14 days  . oxyCODONE-acetaminophen (PERCOCET) 10-325 MG tablet Take 1 tablet by mouth every 4 (four) hours as needed for pain.  . OXYCONTIN 20 MG 12 hr tablet 27 mg.  . potassium chloride (K-DUR) 10 MEQ tablet Take 1 tablet (10 mEq total) by mouth daily.  Marland Kitchen  rosuvastatin (CRESTOR) 20 MG tablet Take 1 tablet (20 mg total) by mouth daily. (Patient not taking: Reported on 05/05/2020)  . Syringe/Needle, Disp, (SYRINGE 3CC/21GX1") 21G X 1" 3 ML MISC Use every 14 days  . testosterone cypionate (DEPO-TESTOSTERONE) 200 MG/ML injection Inject 0.6 mLs (120 mg total) into the muscle every 14 (fourteen) days.  Marland Kitchen topiramate (TOPAMAX) 50 MG tablet TAKE 1 TABLET BY MOUTH  TWICE DAILY   No facility-administered encounter medications on file as of 05/25/2020.      Erico Stan Jenetta Downer, NP

## 2020-05-25 NOTE — Telephone Encounter (Signed)
Noted  

## 2020-05-27 NOTE — Telephone Encounter (Signed)
Faxed signed copy of Detail Written Order PAP & Supplies ppw to Francee Nodal at [facility].

## 2020-06-01 ENCOUNTER — Telehealth: Payer: Self-pay | Admitting: *Deleted

## 2020-06-01 NOTE — Telephone Encounter (Signed)
Noted  

## 2020-06-01 NOTE — Telephone Encounter (Signed)
Margarita Grizzle PT with [facility] left a voicemail stating that she is having a hard time seeing patient for his PT because of numerous issues. Mickel Baas stated that she went out for PT today and patient could not do it because he was nauseated and vomiting. Margarita Grizzle stated that she is going to try to see patient tomorrow for PT. Margarita Grizzle stated that she just wanted to let his PCP know this.

## 2020-06-02 ENCOUNTER — Telehealth: Payer: Self-pay

## 2020-06-02 ENCOUNTER — Other Ambulatory Visit: Payer: Self-pay

## 2020-06-02 ENCOUNTER — Encounter: Payer: Self-pay | Admitting: Family Medicine

## 2020-06-02 ENCOUNTER — Other Ambulatory Visit: Payer: Medicare Other | Admitting: Adult Health Nurse Practitioner

## 2020-06-02 DIAGNOSIS — M5442 Lumbago with sciatica, left side: Secondary | ICD-10-CM | POA: Diagnosis not present

## 2020-06-02 DIAGNOSIS — G20 Parkinson's disease: Secondary | ICD-10-CM

## 2020-06-02 DIAGNOSIS — Z515 Encounter for palliative care: Secondary | ICD-10-CM

## 2020-06-02 DIAGNOSIS — G8929 Other chronic pain: Secondary | ICD-10-CM

## 2020-06-02 DIAGNOSIS — M5441 Lumbago with sciatica, right side: Secondary | ICD-10-CM | POA: Diagnosis not present

## 2020-06-02 NOTE — Telephone Encounter (Signed)
Dennis [name] with [facility] said that [name] is having a lot of swelling and pain in scrotum and palliative care asked Maudie Mercury to hold [name] for 2 wks. Dennis needs verbal order OK to hold [facility] [name] for 2 wks.

## 2020-06-02 NOTE — Progress Notes (Signed)
[facility] Consult Note Telephone: [phone]  Fax: [phone]  PATIENT NAME: Dennis Monte Sr. DOB: [birth year] MRN: [id number]  PRIMARY CARE PROVIDER:   [name], FNP  REFERRING PROVIDER:  [name], [place],  [place] [address]  RESPONSIBLE PARTY:   Doctor Sheahan, wife [phone]  Chief complaint: Follow-up palliative visit/pain management   RECOMMENDATIONS and PLAN: 1.Advanced care planning. Patient is DNR.  No changes made today  2.  Pain.  Morphine ER at 45 mg every 8 hours is not effective and patient is having increased pain.  Patient's symptoms of N/V may be related to increased pain.  Patient and wife state that they would like to go back to the [facility] ER which was more effective and pay out-of-pocket due to being in the donut hole.  Checked [place] substance abuse database with no concerns.  Sent in prescription for Xtampza ER 36 mg every 12 hours to [facility] on [address] in [place].  We will follow up next week for effectiveness  3.  Scrotal hydrocele.  This has resolved with no further complaints of pain or swelling  Palliative will continue to monitor for symptom management/decline and make recommendations as needed.Next visit is in 1 week.  Encouraged to call with any questions or concerns.   HISTORY OF PRESENT ILLNESS:  Dennis Natter Shiley Sr. is a 74 y.o. year old male with multiple medical problems including Parkinson's disease, CHF, Addison's disease, CVA, MI, sleep apnea, hypertension, hyperlipidemia, hypothyroidism, gout, history of testicular cancer and prostate cancer. Palliative Care was asked to help address goals of care.  Patient reports having a lot of nausea and vomiting last night that turned into dry heaving.  Reports that he is still urinating and having soft stools.  States that the switch to extended release morphine is not helping.  Patient is observed  today walking up 3 steps with severe pain with each step.  Patient states that he has not been eating much solid food but has been trying to stay hydrated.  Does state that the hydrocele in his scrotum seems to have resolved.  He continues to use Flexeril and lidocaine patch for his pain.  States having his back stimulator on highest setting.  CODE STATUS: DNR  PPS: 40% HOSPICE ELIGIBILITY/DIAGNOSIS: TBD  PHYSICAL EXAM:  BP 150/88 HR 81 O2 96% on room air General: NAD, frail appearing Eyes: Anicteric and noninjected with no discharge noted Cardiovascular: regular rate and rhythm Pulmonary: Lung sounds clear; normal respiratory effort Abdomen: soft, nontender, + bowel sounds Extremities: Trace to 1+ edema, no joint deformities Skin: no rashes on post skin Neurological: Weakness but otherwise nonfocal   PAST MEDICAL HISTORY:  Past Medical History:  Diagnosis Date  . Addison's disease ([place]) 01/2016  . Arthritis    low back - DDD  . Cataract 2019   corrected with surgery  . Cellulitis, scrotum 08/02/2014  . Chronic diastolic CHF (congestive heart failure) ([place])    Echo 12/18: severe LVH, EF 60-65, Gr 1 DD // Echo 5/18: EF 65-70, Gr 1 DD  . Chronic lower back pain    "from [place] 2007"  . Complication of anesthesia    Sometimes has N&V /w anesth.   . Coronary artery disease    NSTEMI >> LHC 9/01: prox and mid LAD 50-70; mRCA 40 >> med Rx // Nuc 8/13 [facility]):  no infarct or ischemia, EF 59  . Elevated PSA   .  Epididymitis, left 08/04/2014  . History of chronic bronchitis   . History of gout   . History of stroke 01/22/2016   2004 - "right brain stem; no residual " // 2017  . Hypertension   . Hypocholesteremia   . Hypothyroidism   . Infection of skin due to methicillin resistant Staphylococcus aureus (MRSA) 12/28/2017  . Kidney stone   . Myocardial infarction [facility]) 2001   2001- cardiac cath., cardiac clearanece note dr Otho Perl 05-14-13 on chart, stress test results 02-21-12 on chart   . OSA on CPAP    cpap setting of 10  . Parkinson's disease ([place])    stage 4   . Pneumonia 2000's and 2013  . PONV (postoperative nausea and vomiting)   . [facility] spotted fever   . Testicular cancer ([place]) 2015    SOCIAL HX:  Social History   Tobacco Use  . Smoking status: Never Smoker  . Smokeless tobacco: Never Used  Substance Use Topics  . Alcohol use: Not Currently    ALLERGIES:  Allergies  Allergen Reactions  . Bee Venom Anaphylaxis  . Shrimp [Shellfish Allergy] Anaphylaxis and Other (See Comments)    "just the protein in the shrimp"  . Stadol [Butorphanol] Anaphylaxis, Shortness Of Breath and Other (See Comments)    Respiratory distress, couldn't breathe, cardiac arrest  . Wasp Venom Anaphylaxis     PERTINENT MEDICATIONS:  Outpatient Encounter Medications as of 06/02/2020  Medication Sig  . allopurinol (ZYLOPRIM) 300 MG tablet Take 450 mg by mouth daily.   Marland Kitchen apixaban (ELIQUIS) 5 MG TABS tablet Take 1 tablet (5 mg total) by mouth 2 (two) times daily.  . carbidopa-levodopa (SINEMET IR) 25-250 MG tablet TAKE 2 TABLETS BY MOUTH 4  TIMES DAILY  . cephALEXin (KEFLEX) 500 MG capsule Take 1 capsule (500 mg total) by mouth 3 (three) times daily.  . colchicine 0.6 MG tablet TAKE 1 TABLET BY MOUTH TWICE DAILY (Patient taking differently: Take 0.6 mg by mouth 2 (two) times daily. )  . cyclobenzaprine (FLEXERIL) 5 MG tablet Take 5 mg by mouth 3 (three) times daily as needed.  . dorzolamide-timolol (COSOPT) 22.3-6.8 MG/ML ophthalmic solution Place 1 drop into the left eye 2 (two) times daily.  (Patient not taking: Reported on 05/05/2020)  . DULoxetine (CYMBALTA) 60 MG capsule Take 1 capsule (60 mg total) by mouth daily.  Marland Kitchen EPINEPHrine 0.3 mg/0.3 mL IJ SOAJ injection Inject 0.3 mg into the muscle as needed (allergic reaction).  . furosemide (LASIX) 40 MG tablet TAKE 1 TABLET(40 MG) BY MOUTH DAILY  . hydrocortisone (CORTEF) 10 MG tablet TAKE 2 TABLETS BY MOUTH EVERY MORNING AND  1 TABLET AT 5 PM EVERY DAY  . levofloxacin (LEVAQUIN) 500 MG tablet Take 1 tablet (500 mg total) by mouth daily.  Marland Kitchen levothyroxine (SYNTHROID) 137 MCG tablet TAKE 2 TABLETS BY MOUTH  DAILY BEFORE BREAKFAST AND TAKE 1 EXTRA TABLET WEEKLY  . lidocaine (LIDODERM) 5 % APPLY 1 PATCH TO SKIN EVERY DAY. REMOVE AND DISCARD AFTER 12 HOURS OR AS DIRECTED BY MD  . Multiple Vitamin (MULTIVITAMIN WITH MINERALS) TABS tablet Take 1 tablet by mouth daily.  Marland Kitchen NEEDLE, DISP, 22 G 22G X 1" MISC Use every 14 days  . oxyCODONE-acetaminophen (PERCOCET) 10-325 MG tablet Take 1 tablet by mouth every 4 (four) hours as needed for pain.  . OXYCONTIN 20 MG 12 hr tablet 27 mg.  . potassium chloride (K-DUR) 10 MEQ tablet Take 1 tablet (10 mEq total) by mouth daily.  Marland Kitchen  rosuvastatin (CRESTOR) 20 MG tablet Take 1 tablet (20 mg total) by mouth daily. (Patient not taking: Reported on 05/05/2020)  . Syringe/Needle, Disp, (SYRINGE 3CC/21GX1") 21G X 1" 3 ML MISC Use every 14 days  . testosterone cypionate (DEPO-TESTOSTERONE) 200 MG/ML injection Inject 0.6 mLs (120 mg total) into the muscle every 14 (fourteen) days.  Marland Kitchen topiramate (TOPAMAX) 50 MG tablet TAKE 1 TABLET BY MOUTH  TWICE DAILY   No facility-administered encounter medications on file as of 06/02/2020.     Lonnel Gjerde Jenetta Downer, NP

## 2020-06-03 NOTE — Telephone Encounter (Signed)
Called and left message with verbal orders. 

## 2020-06-10 ENCOUNTER — Other Ambulatory Visit: Payer: Medicare Other | Admitting: Adult Health Nurse Practitioner

## 2020-06-10 ENCOUNTER — Other Ambulatory Visit: Payer: Self-pay

## 2020-06-10 DIAGNOSIS — M5441 Lumbago with sciatica, right side: Secondary | ICD-10-CM | POA: Diagnosis not present

## 2020-06-10 DIAGNOSIS — G8929 Other chronic pain: Secondary | ICD-10-CM | POA: Diagnosis not present

## 2020-06-10 DIAGNOSIS — Z515 Encounter for palliative care: Secondary | ICD-10-CM | POA: Diagnosis not present

## 2020-06-10 DIAGNOSIS — M5442 Lumbago with sciatica, left side: Secondary | ICD-10-CM | POA: Diagnosis not present

## 2020-06-10 NOTE — Progress Notes (Signed)
[facility] Consult Note Telephone: [phone]  Fax: [phone]  PATIENT NAME: Dennis [name]. DOB: 02-23-1946 MRN: [id number]  PRIMARY CARE PROVIDER:   [name], FNP  REFERRING PROVIDER:  [name], [place],  [place] [address]  RESPONSIBLE PARTY:   Keonte Daubenspeck, wife [phone]  Chief complaint: Follow-up palliative visit/pain management   RECOMMENDATIONS and PLAN: 1.Advanced care planning. Patient is DNR.  No changes made today  2.  Pain.  Patient getting better relief with the Xtampza 36 mg BID and oxycodone IR 10 mg Q4 hours.  Does seem to be taking less of the oxy IR, which was the goal.  He continues using the flexeril and lidocaine.  Have encouraged to make appointment with Dr. Michaelle Birks to evaluate knee.  Discussed talking with office of LTC for how much out of pocket expense it would be for short term rehab.  Recommend continuing to hold home health PT until seen by Dr. Michaelle Birks.    Palliative will continue to monitor for symptom management/decline and make recommendations as needed.Next visit is in 4 weeks.  Encouraged to call with any questions or concerns.  I spent 40 minutes providing this consultation. More than 50% of the time in this consultation was spent coordinating communication.   HISTORY OF PRESENT ILLNESS:  Dennis [name]. is a 74 y.o. year old male with multiple medical problems including Parkinson's disease, CHF, Addison's disease, CVA, MI, sleep apnea, hypertension, hyperlipidemia, hypothyroidism, gout, history of testicular cancer and prostate cancer. Palliative Care was asked to help address goals of care. Patient's pain is better controlled with the Xtampza 36 mg BID.  States that he has been taking a oxycodone 10 mg along with the Xtampza and that this has been giving him better relief and has been taking about 2 tabs of the oxycodone IR 10 mg in  between.  States that the pain is much more tolerable.  Wife states that he was able to get into the shower instead of washing at the sink for the first time in a long time.  Though it did wear him out. His home health PT has been put on hold until his pain is better controlled. Does state that his right knee has been acting up and that he needs to make an appointment with his ortho doctor, Dr. Michaelle Birks.  States that joint injections have given him relief in the past. Wife was interested in possible LTC placement short term for therapy.  Discussed that it is possible but would probably be out of pocket expense.   CODE STATUS: DNR  PPS: 40% HOSPICE ELIGIBILITY/DIAGNOSIS: TBD  PHYSICAL EXAM:   General: NAD, frail appearing Eyes: Anicteric and noninjected with no discharge noted Extremities: Trace to 1+ edema, no joint deformities Skin: no rashes on post skin Neurological: Weakness but otherwise nonfocal; tremors noted to upper extremities today  PAST MEDICAL HISTORY:  Past Medical History:  Diagnosis Date  . Addison's disease ([place]) 01/2016  . Arthritis    low back - DDD  . Cataract 2019   corrected with surgery  . Cellulitis, scrotum 08/02/2014  . Chronic diastolic CHF (congestive heart failure) ([place])    Echo 12/18: severe LVH, EF 60-65, Gr 1 DD // Echo 5/18: EF 65-70, Gr 1 DD  . Chronic lower back pain    "from [place] 2007"  . Complication of anesthesia    Sometimes has N&V /w anesth.   Marland Kitchen  Coronary artery disease    NSTEMI >> LHC 9/01: prox and mid LAD 50-70; mRCA 40 >> med Rx // Nuc 8/13 [facility]):  no infarct or ischemia, EF 59  . Elevated PSA   . Epididymitis, left 08/04/2014  . History of chronic bronchitis   . History of gout   . History of stroke 01/22/2016   2004 - "right brain stem; no residual " // 2017  . Hypertension   . Hypocholesteremia   . Hypothyroidism   . Infection of skin due to methicillin resistant Staphylococcus aureus (MRSA) 12/28/2017  . Kidney stone   .  Myocardial infarction [facility]) 2001   2001- cardiac cath., cardiac clearanece note dr Otho Perl 05-14-13 on chart, stress test results 02-21-12 on chart  . OSA on CPAP    cpap setting of 10  . Parkinson's disease ([place])    stage 4   . Pneumonia 2000's and 2013  . PONV (postoperative nausea and vomiting)   . [facility] spotted fever   . Testicular cancer ([place]) 2015    SOCIAL HX:  Social History   Tobacco Use  . Smoking status: Never Smoker  . Smokeless tobacco: Never Used  Substance Use Topics  . Alcohol use: Not Currently    ALLERGIES:  Allergies  Allergen Reactions  . Bee Venom Anaphylaxis  . Shrimp [Shellfish Allergy] Anaphylaxis and Other (See Comments)    "just the protein in the shrimp"  . Stadol [Butorphanol] Anaphylaxis, Shortness Of Breath and Other (See Comments)    Respiratory distress, couldn't breathe, cardiac arrest  . Wasp Venom Anaphylaxis     PERTINENT MEDICATIONS:  Outpatient Encounter Medications as of 06/10/2020  Medication Sig  . allopurinol (ZYLOPRIM) 300 MG tablet Take 450 mg by mouth daily.   Marland Kitchen apixaban (ELIQUIS) 5 MG TABS tablet Take 1 tablet (5 mg total) by mouth 2 (two) times daily.  . carbidopa-levodopa (SINEMET IR) 25-250 MG tablet TAKE 2 TABLETS BY MOUTH 4  TIMES DAILY  . cephALEXin (KEFLEX) 500 MG capsule Take 1 capsule (500 mg total) by mouth 3 (three) times daily.  . colchicine 0.6 MG tablet TAKE 1 TABLET BY MOUTH TWICE DAILY (Patient taking differently: Take 0.6 mg by mouth 2 (two) times daily. )  . cyclobenzaprine (FLEXERIL) 5 MG tablet Take 5 mg by mouth 3 (three) times daily as needed.  . dorzolamide-timolol (COSOPT) 22.3-6.8 MG/ML ophthalmic solution Place 1 drop into the left eye 2 (two) times daily.  (Patient not taking: Reported on 05/05/2020)  . DULoxetine (CYMBALTA) 60 MG capsule Take 1 capsule (60 mg total) by mouth daily.  Marland Kitchen EPINEPHrine 0.3 mg/0.3 mL IJ SOAJ injection Inject 0.3 mg into the muscle as needed (allergic reaction).  .  furosemide (LASIX) 40 MG tablet TAKE 1 TABLET(40 MG) BY MOUTH DAILY  . hydrocortisone (CORTEF) 10 MG tablet TAKE 2 TABLETS BY MOUTH EVERY MORNING AND 1 TABLET AT 5 PM EVERY DAY  . levofloxacin (LEVAQUIN) 500 MG tablet Take 1 tablet (500 mg total) by mouth daily.  Marland Kitchen levothyroxine (SYNTHROID) 137 MCG tablet TAKE 2 TABLETS BY MOUTH  DAILY BEFORE BREAKFAST AND TAKE 1 EXTRA TABLET WEEKLY  . lidocaine (LIDODERM) 5 % APPLY 1 PATCH TO SKIN EVERY DAY. REMOVE AND DISCARD AFTER 12 HOURS OR AS DIRECTED BY MD  . Multiple Vitamin (MULTIVITAMIN WITH MINERALS) TABS tablet Take 1 tablet by mouth daily.  Marland Kitchen NEEDLE, DISP, 22 G 22G X 1" MISC Use every 14 days  . oxyCODONE-acetaminophen (PERCOCET) 10-325 MG tablet Take 1 tablet  by mouth every 4 (four) hours as needed for pain.  . OXYCONTIN 20 MG 12 hr tablet 27 mg.  . potassium chloride (K-DUR) 10 MEQ tablet Take 1 tablet (10 mEq total) by mouth daily.  . rosuvastatin (CRESTOR) 20 MG tablet Take 1 tablet (20 mg total) by mouth daily. (Patient not taking: Reported on 05/05/2020)  . Syringe/Needle, Disp, (SYRINGE 3CC/21GX1") 21G X 1" 3 ML MISC Use every 14 days  . testosterone cypionate (DEPO-TESTOSTERONE) 200 MG/ML injection Inject 0.6 mLs (120 mg total) into the muscle every 14 (fourteen) days.  Marland Kitchen topiramate (TOPAMAX) 50 MG tablet TAKE 1 TABLET BY MOUTH  TWICE DAILY   No facility-administered encounter medications on file as of 06/10/2020.     Shayan Bramhall Jenetta Downer, NP

## 2020-06-13 DIAGNOSIS — G20 Parkinson's disease: Secondary | ICD-10-CM | POA: Diagnosis not present

## 2020-06-24 ENCOUNTER — Encounter: Payer: Self-pay | Admitting: Family Medicine

## 2020-07-13 ENCOUNTER — Other Ambulatory Visit: Payer: Medicare Other | Admitting: Adult Health Nurse Practitioner

## 2020-07-17 ENCOUNTER — Other Ambulatory Visit: Payer: Medicare Other

## 2020-07-17 ENCOUNTER — Telehealth: Payer: Self-pay | Admitting: Family Medicine

## 2020-07-17 DIAGNOSIS — R350 Frequency of micturition: Secondary | ICD-10-CM

## 2020-07-17 NOTE — Telephone Encounter (Signed)
Patient needs office visit so we can obtain urine specimen. Please schedule.

## 2020-07-17 NOTE — Telephone Encounter (Signed)
Noted, urine orders placed.

## 2020-07-17 NOTE — Telephone Encounter (Signed)
Pt called in wanted to know about getting a medication for uti he had a lot of them and he knows he has another one. And needs another script.

## 2020-07-17 NOTE — Telephone Encounter (Signed)
Called patient to schedule office visit for UTI. LVM to call back.

## 2020-07-17 NOTE — Telephone Encounter (Signed)
Pt wife called back schedule for virtual on 1/8 can you please put in order for wife to drop off urine specimen.

## 2020-07-18 ENCOUNTER — Encounter: Payer: Self-pay | Admitting: Internal Medicine

## 2020-07-18 ENCOUNTER — Telehealth (INDEPENDENT_AMBULATORY_CARE_PROVIDER_SITE_OTHER): Payer: Medicare Other | Admitting: Internal Medicine

## 2020-07-18 VITALS — Ht 77.0 in | Wt 370.0 lb

## 2020-07-18 DIAGNOSIS — M5442 Lumbago with sciatica, left side: Secondary | ICD-10-CM

## 2020-07-18 DIAGNOSIS — G894 Chronic pain syndrome: Secondary | ICD-10-CM

## 2020-07-18 DIAGNOSIS — G8929 Other chronic pain: Secondary | ICD-10-CM

## 2020-07-18 DIAGNOSIS — R3911 Hesitancy of micturition: Secondary | ICD-10-CM

## 2020-07-18 DIAGNOSIS — R8290 Unspecified abnormal findings in urine: Secondary | ICD-10-CM

## 2020-07-18 DIAGNOSIS — R350 Frequency of micturition: Secondary | ICD-10-CM | POA: Diagnosis not present

## 2020-07-18 DIAGNOSIS — R3915 Urgency of urination: Secondary | ICD-10-CM | POA: Diagnosis not present

## 2020-07-19 ENCOUNTER — Encounter: Payer: Self-pay | Admitting: Internal Medicine

## 2020-07-19 LAB — URINE CULTURE
MICRO NUMBER:: 11394472
SPECIMEN QUALITY:: ADEQUATE

## 2020-07-19 MED ORDER — LIDOCAINE 5 % EX PTCH: MEDICATED_PATCH | CUTANEOUS | 2 refills | Status: DC

## 2020-07-19 MED ORDER — NITROFURANTOIN MONOHYD MACRO 100 MG PO CAPS: 100.0000 mg | ORAL_CAPSULE | Freq: Two times a day (BID) | ORAL | 0 refills | Status: DC

## 2020-07-19 NOTE — Progress Notes (Signed)
Virtual Visit via Video Note  I connected with Dennis Nations Broaddus Sr. on 07/19/20 at 10:20 AM EST by a video enabled telemedicine application and verified that I am speaking with the correct person using two identifiers.  Location: Patient: Home Provider: Home  Persons participating in this video call: Dennis Silversmith, NP and Vinnie Langton   I discussed the limitations of evaluation and management by telemedicine and the availability of in person appointments. The patient expressed understanding and agreed to proceed.  History of Present Illness:  Patient reports urinary urgency, frequency, hesitancy and odor.  He reports this started 5 days ago.  He denies dysuria or blood in his urine.  He denies worsening low back pain or abdominal pain.  He denies fever, chills or nausea.  He has had a history of kidney stones in the past.  He has been pushing fluids.  He has not tried any medications OTC for this.  He reports history of UTIs in the past and reports this feels the same.  He would also like a refill of his lidocaine patches today.  Past Medical History:  Diagnosis Date  . Addison's disease ([place]) 01/2016  . Arthritis    low back - DDD  . Cataract 2019   corrected with surgery  . Cellulitis, scrotum 08/02/2014  . Chronic diastolic CHF (congestive heart failure) ([place])    Echo 12/18: severe LVH, EF 60-65, Gr 1 DD // Echo 5/18: EF 65-70, Gr 1 DD  . Chronic lower back pain    "from [place] 2007"  . Complication of anesthesia    Sometimes has N&V /w anesth.   . Coronary artery disease    NSTEMI >> LHC 9/01: prox and mid LAD 50-70; mRCA 40 >> med Rx // Nuc 8/13 [facility]):  no infarct or ischemia, EF 59  . Elevated PSA   . Epididymitis, left 08/04/2014  . History of chronic bronchitis   . History of gout   . History of stroke 01/22/2016   2004 - "right brain stem; no residual " // 2017  . Hypertension   . Hypocholesteremia   . Hypothyroidism   . Infection of skin due to methicillin resistant  Staphylococcus aureus (MRSA) 12/28/2017  . Kidney stone   . Myocardial infarction [facility]) 2001   2001- cardiac cath., cardiac clearanece note dr Otho Perl 05-14-13 on chart, stress test results 02-21-12 on chart  . OSA on CPAP    cpap setting of 10  . Parkinson's disease ([place])    stage 4   . Pneumonia 2000's and 2013  . PONV (postoperative nausea and vomiting)   . [facility] spotted fever   . Testicular cancer ([place]) 2015    Current Outpatient Medications  Medication Sig Dispense Refill  . allopurinol (ZYLOPRIM) 300 MG tablet Take 450 mg by mouth daily.     Marland Kitchen apixaban (ELIQUIS) 5 MG TABS tablet Take 1 tablet (5 mg total) by mouth 2 (two) times daily. 180 tablet 3  . carbidopa-levodopa (SINEMET IR) 25-250 MG tablet TAKE 2 TABLETS BY MOUTH 4  TIMES DAILY 720 tablet 1  . colchicine 0.6 MG tablet TAKE 1 TABLET BY MOUTH TWICE DAILY (Patient taking differently: Take 0.6 mg by mouth 2 (two) times daily.) 60 tablet 2  . cyclobenzaprine (FLEXERIL) 5 MG tablet Take 5 mg by mouth 3 (three) times daily as needed.    . dorzolamide-timolol (COSOPT) 22.3-6.8 MG/ML ophthalmic solution Place 1 drop into the left eye 2 (two) times daily.    Marland Kitchen  DULoxetine (CYMBALTA) 60 MG capsule Take 1 capsule (60 mg total) by mouth daily. 90 capsule 3  . EPINEPHrine 0.3 mg/0.3 mL IJ SOAJ injection Inject 0.3 mg into the muscle as needed (allergic reaction). 2 each 1  . furosemide (LASIX) 40 MG tablet TAKE 1 TABLET(40 MG) BY MOUTH DAILY 90 tablet 0  . hydrocortisone (CORTEF) 10 MG tablet TAKE 2 TABLETS BY MOUTH EVERY MORNING AND 1 TABLET AT 5 PM EVERY DAY 270 tablet 1  . levofloxacin (LEVAQUIN) 500 MG tablet Take 1 tablet (500 mg total) by mouth daily. 10 tablet 0  . levothyroxine (SYNTHROID) 137 MCG tablet TAKE 2 TABLETS BY MOUTH  DAILY BEFORE BREAKFAST AND TAKE 1 EXTRA TABLET WEEKLY 192 tablet 1  . Multiple Vitamin (MULTIVITAMIN WITH MINERALS) TABS tablet Take 1 tablet by mouth daily. 30 tablet 0  . NEEDLE, DISP, 22 G 22G X 1"  MISC Use every 14 days 10 each 3  . oxyCODONE-acetaminophen (PERCOCET) 10-325 MG tablet Take 1 tablet by mouth every 4 (four) hours as needed for pain.    . OXYCONTIN 20 MG 12 hr tablet 27 mg.    . potassium chloride (K-DUR) 10 MEQ tablet Take 1 tablet (10 mEq total) by mouth daily. 90 tablet 3  . rosuvastatin (CRESTOR) 20 MG tablet Take 1 tablet (20 mg total) by mouth daily. 90 tablet 3  . Syringe/Needle, Disp, (SYRINGE 3CC/21GX1") 21G X 1" 3 ML MISC Use every 14 days 6 each 3  . testosterone cypionate (DEPO-TESTOSTERONE) 200 MG/ML injection Inject 0.6 mLs (120 mg total) into the muscle every 14 (fourteen) days. 10 mL 0  . topiramate (TOPAMAX) 50 MG tablet TAKE 1 TABLET BY MOUTH  TWICE DAILY 180 tablet 3  . lidocaine (LIDODERM) 5 % APPLY 1 PATCH TO SKIN EVERY DAY. REMOVE AND DISCARD AFTER 12 HOURS OR AS DIRECTED BY MD 30 patch 2  . nitrofurantoin, macrocrystal-monohydrate, (MACROBID) 100 MG capsule Take 1 capsule (100 mg total) by mouth 2 (two) times daily. 14 capsule 0   No current facility-administered medications for this visit.    Allergies  Allergen Reactions  . Bee Venom Anaphylaxis  . Shrimp [Shellfish Allergy] Anaphylaxis and Other (See Comments)    "just the protein in the shrimp"  . Stadol [Butorphanol] Anaphylaxis, Shortness Of Breath and Other (See Comments)    Respiratory distress, couldn't breathe, cardiac arrest  . Wasp Venom Anaphylaxis  . Other Hives    Family History  Problem Relation Age of Onset  . Cervical cancer Mother   . Diabetes type II Mother   . Hypertension Mother   . Stroke Mother   . Heart attack Mother   . Dementia Father   . Diabetes type II Sister   . Hypertension Sister   . CAD Sister   . Colon cancer Neg Hx   . Esophageal cancer Neg Hx   . Inflammatory bowel disease Neg Hx   . Liver disease Neg Hx   . Pancreatic cancer Neg Hx   . Rectal cancer Neg Hx   . Stomach cancer Neg Hx     Social History   Socioeconomic History  . Marital  status: Married    Spouse name: Not on file  . Number of children: 1  . Years of education: Not on file  . Highest education level: Not on file  Occupational History  . Occupation: Retired  Tobacco Use  . Smoking status: Never Smoker  . Smokeless tobacco: Never Used  Vaping Use  . Vaping  Use: Never used  Substance and Sexual Activity  . Alcohol use: Not Currently  . Drug use: No  . Sexual activity: Not Currently  Other Topics Concern  . Not on file  Social History Narrative  . Not on file   Social Determinants of Health   Financial Resource Strain: Low Risk   . Difficulty of Paying Living Expenses: Not hard at all  Food Insecurity: No Food Insecurity  . Worried About Programme researcher, broadcasting/film/video in the Last Year: Never true  . Ran Out of Food in the Last Year: Never true  Transportation Needs: No Transportation Needs  . Lack of Transportation (Medical): No  . Lack of Transportation (Non-Medical): No  Physical Activity: Inactive  . Days of Exercise per Week: 0 days  . Minutes of Exercise per Session: 0 min  Stress: No Stress Concern Present  . Feeling of Stress : Not at all  Social Connections: Not on file  Intimate Partner Violence: Not At Risk  . Fear of Current or Ex-Partner: No  . Emotionally Abused: No  . Physically Abused: No  . Sexually Abused: No     Constitutional: Denies fever, malaise, fatigue, headache or abrupt weight changes.  Gastrointestinal: Denies abdominal pain, bloating, constipation, diarrhea or blood in the stool.  GU: Patient reports urinary urgency, frequency, hesitancy and odor.  Denies pain with urination, burning sensation, blood in urine, or discharge. Musculoskeletal: Patient reports chronic back pain.  Denies decrease in range of motion, difficulty with gait, or joint swelling.   No other specific complaints in a complete review of systems (except as listed in HPI above).  Observations/Objective:  Ht 6\' 5"  (1.956 m)   Wt (!) 370 lb (167.8 kg)    BMI 43.88 kg/m  Wt Readings from Last 3 Encounters:  07/18/20 (!) 370 lb (167.8 kg)  05/22/20 (!) 370 lb 6 oz (168 kg)  05/05/20 (!) 370 lb (167.8 kg)    General: Appears his stated age, obese, in NAD. Pulmonary/Chest: Normal effort. Neurological: Alert and oriented.    BMET    Component Value Date/Time   NA 134 (L) 05/22/2020 1717   NA 139 02/22/2017 0000   NA 139 08/11/2012 1640   K 3.9 05/22/2020 1717   K 3.6 08/11/2012 1640   CL 98 05/22/2020 1717   CL 102 08/11/2012 1640   CO2 26 05/22/2020 1717   CO2 30 08/11/2012 1640   GLUCOSE 115 (H) 05/22/2020 1717   GLUCOSE 85 08/11/2012 1640   BUN 7 (L) 05/22/2020 1717   BUN 25 (A) 02/22/2017 0000   BUN 14 08/11/2012 1640   CREATININE 1.14 05/22/2020 1717   CREATININE 0.98 08/11/2012 1640   CALCIUM 9.1 05/22/2020 1717   CALCIUM 9.2 08/11/2012 1640   GFRNONAA >60 05/22/2020 1717   GFRNONAA >60 08/11/2012 1640   GFRAA >60 06/21/2019 1714   GFRAA >60 08/11/2012 1640    Lipid Panel     Component Value Date/Time   CHOL 267 (H) 09/10/2019 1009   TRIG 176.0 (H) 09/10/2019 1009   HDL 38.70 (L) 09/10/2019 1009   CHOLHDL 7 09/10/2019 1009   VLDL 35.2 09/10/2019 1009   LDLCALC 193 (H) 09/10/2019 1009    CBC    Component Value Date/Time   WBC 4.6 05/22/2020 1717   RBC 5.58 05/22/2020 1717   HGB 15.5 05/22/2020 1717   HGB 15.3 08/11/2012 1640   HCT 48.2 05/22/2020 1717   HCT 45.4 08/11/2012 1640   PLT 174 05/22/2020 1717  PLT 214 08/11/2012 1640   MCV 86.4 05/22/2020 1717   MCV 82 08/11/2012 1640   MCH 27.8 05/22/2020 1717   MCHC 32.2 05/22/2020 1717   RDW 13.7 05/22/2020 1717   RDW 14.9 (H) 08/11/2012 1640   LYMPHSABS 1.3 04/15/2020 1115   LYMPHSABS 1.6 08/11/2012 1640   MONOABS 0.5 04/15/2020 1115   MONOABS 0.5 08/11/2012 1640   EOSABS 0.2 04/15/2020 1115   EOSABS 0.3 08/11/2012 1640   BASOSABS 0.0 04/15/2020 1115   BASOSABS 0.0 08/11/2012 1640    Hgb A1C Lab Results  Component Value Date   HGBA1C  5.8 04/15/2020       Assessment and Plan:  Urinary Frequency, Urgency, Hesitancy and Odor:  Unable to locate results of urine sample that was dropped off yesterday or the sample itself Given history of UTIs we will treat with Macrobid 100 mg twice daily x7 days Push fluids   Chronic Back Pain:  Lidocaine patches refilled  Return precautions discussed. Follow Up Instructions:    I discussed the assessment and treatment plan with the patient. The patient was provided an opportunity to ask questions and all were answered. The patient agreed with the plan and demonstrated an understanding of the instructions.   The patient was advised to call back or seek an in-person evaluation if the symptoms worsen or if the condition fails to improve as anticipated.  Dennis Silversmith, NP

## 2020-07-19 NOTE — Patient Instructions (Signed)
Urinary Tract Infection, Adult A urinary tract infection (UTI) is an infection of any part of the urinary tract. The urinary tract includes:  The kidneys.  The ureters.  The bladder.  The urethra. These organs make, store, and get rid of pee (urine) in the body. What are the causes? This infection is caused by germs (bacteria) in your genital area. These germs grow and cause swelling (inflammation) of your urinary tract. What increases the risk? The following factors may make you more likely to develop this condition:  Using a small, thin tube (catheter) to drain pee.  Not being able to control when you pee or poop (incontinence).  Being male. If you are male, these things can increase the risk: ? Using these methods to prevent pregnancy:  A medicine that kills sperm (spermicide).  A device that blocks sperm (diaphragm). ? Having low levels of a male hormone (estrogen). ? Being pregnant. You are more likely to develop this condition if:  You have genes that add to your risk.  You are sexually active.  You take antibiotic medicines.  You have trouble peeing because of: ? A prostate that is bigger than normal, if you are male. ? A blockage in the part of your body that drains pee from the bladder. ? A kidney stone. ? A nerve condition that affects your bladder. ? Not getting enough to drink. ? Not peeing often enough.  You have other conditions, such as: ? Diabetes. ? A weak disease-fighting system (immune system). ? Sickle cell disease. ? Gout. ? Injury of the spine. What are the signs or symptoms? Symptoms of this condition include:  Needing to pee right away.  Peeing small amounts often.  Pain or burning when peeing.  Blood in the pee.  Pee that smells bad or not like normal.  Trouble peeing.  Pee that is cloudy.  Fluid coming from the vagina, if you are male.  Pain in the belly or lower back. Other symptoms include:  Vomiting.  Not  feeling hungry.  Feeling mixed up (confused). This may be the first symptom in older adults.  Being tired and grouchy (irritable).  A fever.  Watery poop (diarrhea). How is this treated?  Taking antibiotic medicine.  Taking other medicines.  Drinking enough water. In some cases, you may need to see a specialist. Follow these instructions at home: Medicines  Take over-the-counter and prescription medicines only as told by your doctor.  If you were prescribed an antibiotic medicine, take it as told by your doctor. Do not stop taking it even if you start to feel better. General instructions  Make sure you: ? Pee until your bladder is empty. ? Do not hold pee for a long time. ? Empty your bladder after sex. ? Wipe from front to back after peeing or pooping if you are a male. Use each tissue one time when you wipe.  Drink enough fluid to keep your pee pale yellow.  Keep all follow-up visits.   Contact a doctor if:  You do not get better after 1-2 days.  Your symptoms go away and then come back. Get help right away if:  You have very bad back pain.  You have very bad pain in your lower belly.  You have a fever.  You have chills.  You feeling like you will vomit or you vomit. Summary  A urinary tract infection (UTI) is an infection of any part of the urinary tract.  This condition is caused by   germs in your genital area.  There are many risk factors for a UTI.  Treatment includes antibiotic medicines.  Drink enough fluid to keep your pee pale yellow. This information is not intended to replace advice given to you by your health care provider. Make sure you discuss any questions you have with your health care provider. Document Revised: 02/07/2020 Document Reviewed: 02/07/2020 Elsevier Patient Education  2021 Elsevier Inc.  

## 2020-07-20 ENCOUNTER — Encounter: Payer: Self-pay | Admitting: Internal Medicine

## 2020-07-31 ENCOUNTER — Other Ambulatory Visit: Payer: Self-pay

## 2020-07-31 ENCOUNTER — Other Ambulatory Visit: Payer: Medicare Other | Admitting: Adult Health Nurse Practitioner

## 2020-07-31 DIAGNOSIS — G8929 Other chronic pain: Secondary | ICD-10-CM | POA: Diagnosis not present

## 2020-07-31 DIAGNOSIS — Z515 Encounter for palliative care: Secondary | ICD-10-CM

## 2020-07-31 DIAGNOSIS — M5442 Lumbago with sciatica, left side: Secondary | ICD-10-CM | POA: Diagnosis not present

## 2020-07-31 DIAGNOSIS — G20 Parkinson's disease: Secondary | ICD-10-CM

## 2020-07-31 DIAGNOSIS — K5900 Constipation, unspecified: Secondary | ICD-10-CM

## 2020-07-31 DIAGNOSIS — M5441 Lumbago with sciatica, right side: Secondary | ICD-10-CM | POA: Diagnosis not present

## 2020-07-31 NOTE — Progress Notes (Signed)
Therapist, nutritional Palliative Care Consult Note Telephone: [phone]  Fax: [phone]  PATIENT NAME: Dennis Clinkenbeard Sr. DOB: 1945-11-10 MRN: [id number]  PRIMARY CARE PROVIDER:  Dr. Gweneth Dimitri  REFERRING PROVIDER:  Emi Belfast, FNP No address on file  RESPONSIBLE PARTY:   Viraaj Biehl, wife [phone]  Chief complaint:  Follow up palliative visit/pain/short term memory loss  Due to the COVID-19 crisis, this visit was done via telemedicine and it was initiated and consent by this patient and or family. Video-audio (telehealth) contact was unable to be done due to technical barriers from the patient's side.      RECOMMENDATIONS and PLAN:  1.  Advanced care planning.  Patient is DNR  2.  Pain.  Patient needing refill on oxycodone 10 mg Q 4hrs PRN.  Checked [place] substance abuse data base.  Sent escript to [facility] on [address] in [place].    3.  Parkinson's.  Patient having more forgetfulness.  Discussed that this could be a part of Parkinson's progression.  Advised to reach out to neurology for an appointment.  Patient/wife state that they will call neurology for an appointment.  4.  Constipation.  Have encouraged to take Miralax daily instead of PRN to get more regular BMs  Palliative will continue to monitor for symptom management/decline and make recommendations as needed.Next visit is in 3 weeks.  Encouraged to call with any questions or concerns.  I spent 60 minutes providing this consultation. More than 50% of the time in this consultation was spent coordinating communication.   HISTORY OF PRESENT ILLNESS:  Dennis Ravenscraft Arechiga Sr. is a 75 y.o. year old male with multiple medical problems including Parkinson's disease, CHF, Addison's disease, CVA, MI, sleep apnea, hypertension, hyperlipidemia, hypothyroidism, gout, history of testicular cancer and prostate cancer. Palliative Care was asked to help address goals of care. Patient was treated  for UTI recently.  Denies dysuria, hematuria, fever today.  Does have constipation but states that his BMs have been softer with the antibiotic therapy.  He does take dulcolax daily and Miralax PRN. Patient getting adequate pain relief with Xtampza 36 mg BID and oxycodone 10 mg Q 4 hours PRN.  States that he takes the oxycodone 10 mg about every 4 hours but a 30 day supply lasts him longer than 30 days.  He also uses flexeril and lidocaine patches with good relief.  Wife states that he has been having more forgetfulness over the past 4-6 weeks.  He is forgetting financial transactions that have already been done, forgetting his account passwords that he uses more frequently.  Patient's appetite has decreased and wife states that it is getting harder to get him to eat.  He states that he has dropped a size in his clothes.  His PCP is no longer with [facility] and they have assigned him to a different provider at the practice.  Has appointment with this provider on 08/19/20 and would like me join virtually.  Will accommodate if possible.  CODE STATUS: DNR  PPS: 40% HOSPICE ELIGIBILITY/DIAGNOSIS: TBD  PHYSICAL EXAM:   Deferred   PAST MEDICAL HISTORY:  Past Medical History:  Diagnosis Date  . Addison's disease ([facility]) 01/2016  . Arthritis    low back - DDD  . Cataract 2019   corrected with surgery  . Cellulitis, scrotum 08/02/2014  . Chronic diastolic CHF (congestive heart failure) ([facility])    Echo 12/18: severe LVH, EF 60-65, Gr 1 DD // Echo 5/18: EF 65-70,  Gr 1 DD  . Chronic lower back pain    "from [place] 2007"  . Complication of anesthesia    Sometimes has N&V /w anesth.   . Coronary artery disease    NSTEMI >> LHC 9/01: prox and mid LAD 50-70; mRCA 40 >> med Rx // Nuc 8/13 [facility]):  no infarct or ischemia, EF 59  . Elevated PSA   . Epididymitis, left 08/04/2014  . History of chronic bronchitis   . History of gout   . History of stroke 01/22/2016   2004 - "right brain stem; no residual " // 2017   . Hypertension   . Hypocholesteremia   . Hypothyroidism   . Infection of skin due to methicillin resistant Staphylococcus aureus (MRSA) 12/28/2017  . Kidney stone   . Myocardial infarction [facility]) 2001   2001- cardiac cath., cardiac clearanece note dr Otho Perl 05-14-13 on chart, stress test results 02-21-12 on chart  . OSA on CPAP    cpap setting of 10  . Parkinson's disease ([place])    stage 4   . Pneumonia 2000's and 2013  . PONV (postoperative nausea and vomiting)   . [facility] spotted fever   . Testicular cancer ([place]) 2015    SOCIAL HX:  Social History   Tobacco Use  . Smoking status: Never Smoker  . Smokeless tobacco: Never Used  Substance Use Topics  . Alcohol use: Not Currently    ALLERGIES:  Allergies  Allergen Reactions  . Bee Venom Anaphylaxis  . Shrimp [Shellfish Allergy] Anaphylaxis and Other (See Comments)    "just the protein in the shrimp"  . Stadol [Butorphanol] Anaphylaxis, Shortness Of Breath and Other (See Comments)    Respiratory distress, couldn't breathe, cardiac arrest  . Wasp Venom Anaphylaxis  . Other Hives     PERTINENT MEDICATIONS:  Outpatient Encounter Medications as of 07/31/2020  Medication Sig  . allopurinol (ZYLOPRIM) 300 MG tablet Take 450 mg by mouth daily.   Marland Kitchen apixaban (ELIQUIS) 5 MG TABS tablet Take 1 tablet (5 mg total) by mouth 2 (two) times daily.  . carbidopa-levodopa (SINEMET IR) 25-250 MG tablet TAKE 2 TABLETS BY MOUTH 4  TIMES DAILY  . colchicine 0.6 MG tablet TAKE 1 TABLET BY MOUTH TWICE DAILY (Patient taking differently: Take 0.6 mg by mouth 2 (two) times daily.)  . cyclobenzaprine (FLEXERIL) 5 MG tablet Take 5 mg by mouth 3 (three) times daily as needed.  . dorzolamide-timolol (COSOPT) 22.3-6.8 MG/ML ophthalmic solution Place 1 drop into the left eye 2 (two) times daily.  . DULoxetine (CYMBALTA) 60 MG capsule Take 1 capsule (60 mg total) by mouth daily.  Marland Kitchen EPINEPHrine 0.3 mg/0.3 mL IJ SOAJ injection Inject 0.3 mg into the muscle  as needed (allergic reaction).  . furosemide (LASIX) 40 MG tablet TAKE 1 TABLET(40 MG) BY MOUTH DAILY  . hydrocortisone (CORTEF) 10 MG tablet TAKE 2 TABLETS BY MOUTH EVERY MORNING AND 1 TABLET AT 5 PM EVERY DAY  . levofloxacin (LEVAQUIN) 500 MG tablet Take 1 tablet (500 mg total) by mouth daily.  Marland Kitchen levothyroxine (SYNTHROID) 137 MCG tablet TAKE 2 TABLETS BY MOUTH  DAILY BEFORE BREAKFAST AND TAKE 1 EXTRA TABLET WEEKLY  . lidocaine (LIDODERM) 5 % APPLY 1 PATCH TO SKIN EVERY DAY. REMOVE AND DISCARD AFTER 12 HOURS OR AS DIRECTED BY MD  . Multiple Vitamin (MULTIVITAMIN WITH MINERALS) TABS tablet Take 1 tablet by mouth daily.  Marland Kitchen NEEDLE, DISP, 22 G 22G X 1" MISC Use every 14 days  .  nitrofurantoin, macrocrystal-monohydrate, (MACROBID) 100 MG capsule Take 1 capsule (100 mg total) by mouth 2 (two) times daily.  Marland Kitchen oxyCODONE-acetaminophen (PERCOCET) 10-325 MG tablet Take 1 tablet by mouth every 4 (four) hours as needed for pain.  . OXYCONTIN 20 MG 12 hr tablet 27 mg.  . potassium chloride (K-DUR) 10 MEQ tablet Take 1 tablet (10 mEq total) by mouth daily.  . rosuvastatin (CRESTOR) 20 MG tablet Take 1 tablet (20 mg total) by mouth daily.  . Syringe/Needle, Disp, (SYRINGE 3CC/21GX1") 21G X 1" 3 ML MISC Use every 14 days  . testosterone cypionate (DEPO-TESTOSTERONE) 200 MG/ML injection Inject 0.6 mLs (120 mg total) into the muscle every 14 (fourteen) days.  Marland Kitchen topiramate (TOPAMAX) 50 MG tablet TAKE 1 TABLET BY MOUTH  TWICE DAILY   No facility-administered encounter medications on file as of 07/31/2020.     Krishan Mcbreen Jenetta Downer, NP

## 2020-08-03 ENCOUNTER — Other Ambulatory Visit: Payer: Medicare Other | Admitting: Adult Health Nurse Practitioner

## 2020-08-03 ENCOUNTER — Other Ambulatory Visit: Payer: Self-pay

## 2020-08-03 DIAGNOSIS — M5442 Lumbago with sciatica, left side: Secondary | ICD-10-CM

## 2020-08-03 DIAGNOSIS — N390 Urinary tract infection, site not specified: Secondary | ICD-10-CM | POA: Diagnosis not present

## 2020-08-03 DIAGNOSIS — R634 Abnormal weight loss: Secondary | ICD-10-CM

## 2020-08-03 DIAGNOSIS — G20 Parkinson's disease: Secondary | ICD-10-CM | POA: Diagnosis not present

## 2020-08-03 DIAGNOSIS — Z515 Encounter for palliative care: Secondary | ICD-10-CM | POA: Diagnosis not present

## 2020-08-03 DIAGNOSIS — G8929 Other chronic pain: Secondary | ICD-10-CM | POA: Diagnosis not present

## 2020-08-03 DIAGNOSIS — M5441 Lumbago with sciatica, right side: Secondary | ICD-10-CM | POA: Diagnosis not present

## 2020-08-03 DIAGNOSIS — G20C Parkinsonism, unspecified: Secondary | ICD-10-CM

## 2020-08-03 NOTE — Progress Notes (Signed)
Therapist, nutritional Palliative Care Consult Note Telephone: [phone]  Fax: [phone]  PATIENT NAME: Dennis Borey Sr. DOB: [birth year] MRN: [id number]  PRIMARY CARE PROVIDER:   Emi Belfast, FNP (Inactive)  REFERRING PROVIDER:  No referring provider defined for this encounter.  RESPONSIBLE PARTY:   Amith Noblett, wife [phone]  Chief complaint:  Follow up palliative visit/possible UTI   RECOMMENDATIONS and PLAN:  1.  Advanced care planning.  Patient is DNR  2.  Parkinson's.  Patient memory loss and increased tremor of arms and upper body.  Patient/wife trying to get appointment with neurology  3.  Pain.  Though patient getting better pain relief with multiple modalities, Xtampza 36 mg BID, oxycodone 10 mg Q4 hrs PRN, flexeril PRN, lidocaine patches, and has back stimulator.  He still has pain with walking.  Patient changing PCP and would like to confer with the new PCP before making any further changes.    4.  Possible UTI.  Patient has been having more frequent UTIs.  He is having dark urine and foul odor today.  Has appointment with provider at primary office tomorrow.    5.  Weight loss.  Distortion in taste could be related to a vitamin deficiency and he has appointment with provider at primary office tomorrow.  Of note wife is concerned that it may be difficult getting him to the provider's office tomorrow  Discussed today with his weight loss and continued functional decline that will discuss hospice eligibility with hospice physicians.  They are in agreement with hospice services if eligible.  Will call wife back with hospice physicians' response.  Will set up next appointment if not hospice eligible. Spent more than 20 minutes discussing ACP and complex medical decision making.  Discussed progression of Parkinson's versus acute processes.  Patient does not want to go to the hospital if possible.    HISTORY OF PRESENT ILLNESS:  Dennis Constanza  Beyl Sr. is a 75 y.o. year old male with multiple medical problems including Parkinson's disease, CHF, Addison's disease, CVA, MI, sleep apnea, hypertension, hyperlipidemia, hypothyroidism, gout, history of testicular cancer and prostate cancer. Palliative Care was asked to help address goals of care. Patient is having dark urine and foul odor to his urine.  Denies fever or dysuria.  He is having nausea and has had one episode of vomiting over the weekend.  He has chills and hot sweats but this is not new for him.  Family states that he has been having increased memory loss for about 4-6 weeks now.  Will forget taking care of financial concerns.  His appetite is decreasing and states that food just does not taste good anymore.  He has lost 26.3 pounds over the past 2 months.  He weighed 370 with BMI of 43.92 in November and today weighed 343.7 with BMI of 40.8, which is 7.1% weight loss in 2 months.  Wife states that he eats less than 1000 calories a day.  He is still able to get up on his own and can walk short distances but is getting weaker and is having to get more help with ADLs.   CODE STATUS: DNR  PPS: 40% HOSPICE ELIGIBILITY/DIAGNOSIS: TBD  PHYSICAL EXAM:  BP 128/88  HR 86  O2 99% on RA General: NAD, frail appearing, patient looks pale today Eyes:  Sclera anicteric and noninjected with no discharge noted ENMT: moist mucous membranes Cardiovascular: regular rate and rhythm Pulmonary: lung sounds clear; normal respiratory effort Abdomen:  soft, nontender, hypoactive bowel sounds GU: no suprapubic tenderness Extremities: no edema, no joint deformities Skin: no rashes on exposed skin Neurological: Weakness; having forgetfulness   PAST MEDICAL HISTORY:  Past Medical History:  Diagnosis Date  . Addison's disease ([place]) 01/2016  . Arthritis    low back - DDD  . Cataract 2019   corrected with surgery  . Cellulitis, scrotum 08/02/2014  . Chronic diastolic CHF (congestive heart failure)  ([place])    Echo 12/18: severe LVH, EF 60-65, Gr 1 DD // Echo 5/18: EF 65-70, Gr 1 DD  . Chronic lower back pain    "from [place] 2007"  . Complication of anesthesia    Sometimes has N&V /w anesth.   . Coronary artery disease    NSTEMI >> LHC 9/01: prox and mid LAD 50-70; mRCA 40 >> med Rx // Nuc 8/13 [facility]):  no infarct or ischemia, EF 59  . Elevated PSA   . Epididymitis, left 08/04/2014  . History of chronic bronchitis   . History of gout   . History of stroke 01/22/2016   2004 - "right brain stem; no residual " // 2017  . Hypertension   . Hypocholesteremia   . Hypothyroidism   . Infection of skin due to methicillin resistant Staphylococcus aureus (MRSA) 12/28/2017  . Kidney stone   . Myocardial infarction [facility]) 2001   2001- cardiac cath., cardiac clearanece note dr Otho Perl 05-14-13 on chart, stress test results 02-21-12 on chart  . OSA on CPAP    cpap setting of 10  . Parkinson's disease ([place])    stage 4   . Pneumonia 2000's and 2013  . PONV (postoperative nausea and vomiting)   . [facility] spotted fever   . Testicular cancer ([place]) 2015    SOCIAL HX:  Social History   Tobacco Use  . Smoking status: Never Smoker  . Smokeless tobacco: Never Used  Substance Use Topics  . Alcohol use: Not Currently    ALLERGIES:  Allergies  Allergen Reactions  . Bee Venom Anaphylaxis  . Shrimp [Shellfish Allergy] Anaphylaxis and Other (See Comments)    "just the protein in the shrimp"  . Stadol [Butorphanol] Anaphylaxis, Shortness Of Breath and Other (See Comments)    Respiratory distress, couldn't breathe, cardiac arrest  . Wasp Venom Anaphylaxis  . Other Hives     PERTINENT MEDICATIONS:  Outpatient Encounter Medications as of 75/24/2022  Medication Sig  . allopurinol (ZYLOPRIM) 300 MG tablet Take 450 mg by mouth daily.   Marland Kitchen apixaban (ELIQUIS) 5 MG TABS tablet Take 1 tablet (5 mg total) by mouth 2 (two) times daily.  . carbidopa-levodopa (SINEMET IR) 25-250 MG tablet TAKE 2 TABLETS BY  MOUTH 4  TIMES DAILY  . colchicine 0.6 MG tablet TAKE 1 TABLET BY MOUTH TWICE DAILY (Patient taking differently: Take 0.6 mg by mouth 2 (two) times daily.)  . cyclobenzaprine (FLEXERIL) 5 MG tablet Take 5 mg by mouth 3 (three) times daily as needed.  . dorzolamide-timolol (COSOPT) 22.3-6.8 MG/ML ophthalmic solution Place 1 drop into the left eye 2 (two) times daily.  . DULoxetine (CYMBALTA) 60 MG capsule Take 1 capsule (60 mg total) by mouth daily.  Marland Kitchen EPINEPHrine 0.3 mg/0.3 mL IJ SOAJ injection Inject 0.3 mg into the muscle as needed (allergic reaction).  . furosemide (LASIX) 40 MG tablet TAKE 1 TABLET(40 MG) BY MOUTH DAILY  . hydrocortisone (CORTEF) 10 MG tablet TAKE 2 TABLETS BY MOUTH EVERY MORNING AND 1 TABLET AT 5 PM EVERY DAY  .  levofloxacin (LEVAQUIN) 500 MG tablet Take 1 tablet (500 mg total) by mouth daily.  Marland Kitchen levothyroxine (SYNTHROID) 137 MCG tablet TAKE 2 TABLETS BY MOUTH  DAILY BEFORE BREAKFAST AND TAKE 1 EXTRA TABLET WEEKLY  . lidocaine (LIDODERM) 5 % APPLY 1 PATCH TO SKIN EVERY DAY. REMOVE AND DISCARD AFTER 12 HOURS OR AS DIRECTED BY MD  . Multiple Vitamin (MULTIVITAMIN WITH MINERALS) TABS tablet Take 1 tablet by mouth daily.  Marland Kitchen NEEDLE, DISP, 22 G 22G X 1" MISC Use every 14 days  . nitrofurantoin, macrocrystal-monohydrate, (MACROBID) 100 MG capsule Take 1 capsule (100 mg total) by mouth 2 (two) times daily.  Marland Kitchen oxyCODONE-acetaminophen (PERCOCET) 10-325 MG tablet Take 1 tablet by mouth every 4 (four) hours as needed for pain.  . OXYCONTIN 20 MG 12 hr tablet 27 mg.  . potassium chloride (K-DUR) 10 MEQ tablet Take 1 tablet (10 mEq total) by mouth daily.  . rosuvastatin (CRESTOR) 20 MG tablet Take 1 tablet (20 mg total) by mouth daily.  . Syringe/Needle, Disp, (SYRINGE 3CC/21GX1") 21G X 1" 3 ML MISC Use every 14 days  . testosterone cypionate (DEPO-TESTOSTERONE) 200 MG/ML injection Inject 0.6 mLs (120 mg total) into the muscle every 14 (fourteen) days.  Marland Kitchen topiramate (TOPAMAX) 50 MG tablet  TAKE 1 TABLET BY MOUTH  TWICE DAILY   No facility-administered encounter medications on file as of 08/03/2020.     Dontavious Emily Jenetta Downer, NP

## 2020-08-04 ENCOUNTER — Encounter: Payer: Self-pay | Admitting: Family Medicine

## 2020-08-04 ENCOUNTER — Ambulatory Visit (INDEPENDENT_AMBULATORY_CARE_PROVIDER_SITE_OTHER): Payer: Medicare Other | Admitting: Family Medicine

## 2020-08-04 ENCOUNTER — Telehealth: Payer: Self-pay | Admitting: Adult Health Nurse Practitioner

## 2020-08-04 ENCOUNTER — Other Ambulatory Visit: Payer: Self-pay

## 2020-08-04 VITALS — BP 124/80 | HR 111 | Temp 97.8°F | Ht 77.0 in | Wt 348.0 lb

## 2020-08-04 DIAGNOSIS — Z125 Encounter for screening for malignant neoplasm of prostate: Secondary | ICD-10-CM

## 2020-08-04 DIAGNOSIS — D696 Thrombocytopenia, unspecified: Secondary | ICD-10-CM

## 2020-08-04 DIAGNOSIS — R634 Abnormal weight loss: Secondary | ICD-10-CM

## 2020-08-04 DIAGNOSIS — R300 Dysuria: Secondary | ICD-10-CM | POA: Diagnosis not present

## 2020-08-04 DIAGNOSIS — G20 Parkinson's disease: Secondary | ICD-10-CM

## 2020-08-04 MED ORDER — CEPHALEXIN 500 MG PO CAPS: 500.0000 mg | ORAL_CAPSULE | Freq: Two times a day (BID) | ORAL | 0 refills | Status: DC

## 2020-08-04 NOTE — Telephone Encounter (Signed)
Spoke with wife about hospice physicians deciding that husband not quite ready for hospice but getting closer.  Wanting to do more frequent visits.  Scheduled next visit for 08/20/20 @3pm  Carlinda Ohlson K. Olena Heckle NP

## 2020-08-04 NOTE — Patient Instructions (Signed)
Go to the lab on the way out.   If you have mychart we'll likely use that to update you.    Restart keflex.  Take care.  Glad to see you.

## 2020-08-04 NOTE — Progress Notes (Signed)
This visit occurred during the SARS-CoV-2 public health emergency.  Safety protocols were in place, including screening questions prior to the visit, additional usage of staff PPE, and extensive cleaning of exam room while observing appropriate contact time as indicated for disinfecting solutions.  H/o Parkinson's with known agent orange exposure.  Discussed.  Weight loss noted, of unclear source.  Discussed rechecking labs given his weight loss.  History of prostate cancer noted.  History of recurrent UTI.  He was treated for UTI.  E coli sens to macrobid.  Sx didn't improve.  Still with flank pain, left sided usually, rarely on the R side.  Fever about 2 weeks ago.  No fever in the last few days.  No cough.    In wheelchair at baseline.  Here today with his wife.  Meds, vitals, and allergies reviewed.   ROS: Per HPI unless specifically indicated in ROS section   nad In wheelchair Speech and judgment normal. rrr ctab abd soft, not ttp Extremities with 1+ edema.  Skin well perfused.  30 minutes were devoted to patient care in this encounter (this includes time spent reviewing the patient's file/history, interviewing and examining the patient, counseling/reviewing plan with patient).

## 2020-08-05 DIAGNOSIS — R300 Dysuria: Secondary | ICD-10-CM | POA: Insufficient documentation

## 2020-08-05 DIAGNOSIS — R634 Abnormal weight loss: Secondary | ICD-10-CM | POA: Insufficient documentation

## 2020-08-05 LAB — CBC WITH DIFFERENTIAL/PLATELET
Basophils Absolute: 0.1 10*3/uL (ref 0.0–0.1)
Basophils Relative: 1.1 % (ref 0.0–3.0)
Eosinophils Absolute: 0.1 10*3/uL (ref 0.0–0.7)
Eosinophils Relative: 1.2 % (ref 0.0–5.0)
HCT: 46.1 % (ref 39.0–52.0)
Hemoglobin: 15.5 g/dL (ref 13.0–17.0)
Lymphocytes Relative: 13.4 % (ref 12.0–46.0)
Lymphs Abs: 0.9 10*3/uL (ref 0.7–4.0)
MCHC: 33.6 g/dL (ref 30.0–36.0)
MCV: 82.5 fl (ref 78.0–100.0)
Monocytes Absolute: 0.4 10*3/uL (ref 0.1–1.0)
Monocytes Relative: 5.1 % (ref 3.0–12.0)
Neutro Abs: 5.4 10*3/uL (ref 1.4–7.7)
Neutrophils Relative %: 79.2 % — ABNORMAL HIGH (ref 43.0–77.0)
Platelets: 206 10*3/uL (ref 150.0–400.0)
RBC: 5.6 Mil/uL (ref 4.22–5.81)
RDW: 13.9 % (ref 11.5–15.5)
WBC: 6.8 10*3/uL (ref 4.0–10.5)

## 2020-08-05 LAB — COMPREHENSIVE METABOLIC PANEL
ALT: 9 U/L (ref 0–53)
AST: 14 U/L (ref 0–37)
Albumin: 4.2 g/dL (ref 3.5–5.2)
Alkaline Phosphatase: 60 U/L (ref 39–117)
BUN: 16 mg/dL (ref 6–23)
CO2: 30 mEq/L (ref 19–32)
Calcium: 9.8 mg/dL (ref 8.4–10.5)
Chloride: 96 mEq/L (ref 96–112)
Creatinine, Ser: 1.29 mg/dL (ref 0.40–1.50)
GFR: 54.5 mL/min — ABNORMAL LOW (ref >60.00)
Glucose, Bld: 107 mg/dL — ABNORMAL HIGH (ref 70–99)
Potassium: 4.4 mEq/L (ref 3.5–5.1)
Sodium: 133 mEq/L — ABNORMAL LOW (ref 135–145)
Total Bilirubin: 1.3 mg/dL — ABNORMAL HIGH (ref 0.2–1.2)
Total Protein: 7.3 g/dL (ref 6.0–8.3)

## 2020-08-05 LAB — URIC ACID: Uric Acid, Serum: 6.3 mg/dL (ref 4.0–7.8)

## 2020-08-05 LAB — VITAMIN B12: Vitamin B-12: 281 pg/mL (ref 211–911)

## 2020-08-05 LAB — TSH: TSH: 3.2 u[IU]/mL (ref 0.35–4.50)

## 2020-08-05 LAB — PSA, MEDICARE: PSA: 3.99 ng/ml (ref 0.10–4.00)

## 2020-08-05 NOTE — Assessment & Plan Note (Signed)
Presumed UTI.  Okay for outpatient follow-up.  Restart Keflex.  Check urinalysis.  See notes on labs.  Routine cautions given to patient.  He agrees with plan.

## 2020-08-05 NOTE — Assessment & Plan Note (Signed)
With history of mild thrombocytopenia.  Weight loss noted.  No clear source.  Check labs today.  He agrees.

## 2020-08-06 ENCOUNTER — Telehealth: Payer: Self-pay | Admitting: Adult Health Nurse Practitioner

## 2020-08-06 ENCOUNTER — Encounter: Payer: Self-pay | Admitting: Family Medicine

## 2020-08-06 LAB — URINE CULTURE
MICRO NUMBER:: 11454346
SPECIMEN QUALITY:: ADEQUATE

## 2020-08-06 NOTE — Telephone Encounter (Signed)
Returned patient's VM.  He had some concerns about some off numbers on his lab work.  Stated that provider at PCP was not concerned about these numbers.  Reassured him that what he was seeing was not concerning and the small levels that they were off explained by the UTI and decreased oral intake he is presently going through.  Encouraged to call with any further questions. Jaxiel Kines K. Olena Heckle NP

## 2020-08-19 ENCOUNTER — Ambulatory Visit (INDEPENDENT_AMBULATORY_CARE_PROVIDER_SITE_OTHER): Payer: Medicare Other | Admitting: Family Medicine

## 2020-08-19 ENCOUNTER — Encounter: Payer: Self-pay | Admitting: Family Medicine

## 2020-08-19 ENCOUNTER — Other Ambulatory Visit: Payer: Self-pay

## 2020-08-19 VITALS — BP 120/78 | HR 112 | Temp 97.6°F | Ht 77.0 in | Wt 350.5 lb

## 2020-08-19 DIAGNOSIS — R300 Dysuria: Secondary | ICD-10-CM | POA: Diagnosis not present

## 2020-08-19 DIAGNOSIS — I10 Essential (primary) hypertension: Secondary | ICD-10-CM | POA: Diagnosis not present

## 2020-08-19 DIAGNOSIS — E230 Hypopituitarism: Secondary | ICD-10-CM

## 2020-08-19 DIAGNOSIS — I480 Paroxysmal atrial fibrillation: Secondary | ICD-10-CM | POA: Diagnosis not present

## 2020-08-19 DIAGNOSIS — M109 Gout, unspecified: Secondary | ICD-10-CM | POA: Diagnosis not present

## 2020-08-19 DIAGNOSIS — M7989 Other specified soft tissue disorders: Secondary | ICD-10-CM

## 2020-08-19 DIAGNOSIS — M5442 Lumbago with sciatica, left side: Secondary | ICD-10-CM | POA: Diagnosis not present

## 2020-08-19 DIAGNOSIS — G8929 Other chronic pain: Secondary | ICD-10-CM

## 2020-08-19 DIAGNOSIS — G20 Parkinson's disease: Secondary | ICD-10-CM

## 2020-08-19 DIAGNOSIS — E039 Hypothyroidism, unspecified: Secondary | ICD-10-CM | POA: Diagnosis not present

## 2020-08-19 DIAGNOSIS — I5032 Chronic diastolic (congestive) heart failure: Secondary | ICD-10-CM | POA: Diagnosis not present

## 2020-08-19 DIAGNOSIS — G20C Parkinsonism, unspecified: Secondary | ICD-10-CM

## 2020-08-19 DIAGNOSIS — M5441 Lumbago with sciatica, right side: Secondary | ICD-10-CM | POA: Diagnosis not present

## 2020-08-19 NOTE — Assessment & Plan Note (Signed)
Cont testosterone. Follows with endo. stable

## 2020-08-19 NOTE — Assessment & Plan Note (Signed)
Follows with cardiology. Cont lasix 40 mg. Appears stable on exam today.

## 2020-08-19 NOTE — Assessment & Plan Note (Signed)
Notes he is only taking prn allopurinol when has flares. Follows with ortho. Cont prn treatment

## 2020-08-19 NOTE — Assessment & Plan Note (Signed)
Regular today. Cont eliquis 5 mg bid

## 2020-08-19 NOTE — Assessment & Plan Note (Addendum)
Progressive. Following with Dr. Anna Genre and palliative. Wheel Chair bound. Worsening over the last year. Motor skills are significantly declining. Unable to work with PT 2/2 to chronic back pain

## 2020-08-19 NOTE — Assessment & Plan Note (Signed)
Recently treated for UTI. Then persistent symptoms but UCx with mixed flora. Completed abx w/o improvement. Discussed that likely not infection and advised urology. He will call Dr. Jackson Latino office.

## 2020-08-19 NOTE — Assessment & Plan Note (Signed)
Most limiting and greatest source of pain. Working with palliative on pain regimen which is helping. Appreciate palliative support.

## 2020-08-19 NOTE — Assessment & Plan Note (Signed)
Follows with endocrinology. Cont levothyroxine 137 mcg Lab Results  Component Value Date   TSH 3.20 08/04/2020

## 2020-08-19 NOTE — Patient Instructions (Signed)
Urine symptoms - try drinking more water - consider Cranberry juice - call Dr. Jackson Latino office

## 2020-08-19 NOTE — Assessment & Plan Note (Signed)
Chronic right side > left. At baseline on exam today.

## 2020-08-19 NOTE — Progress Notes (Signed)
Subjective:     Dennis Relph Segel Sr. is a 75 y.o. male presenting for Transitions Of Care     HPI  #Recurrent UTI - sees Dr. Lovena Neighbours at [facility] - has not seen him for this recently   #chronic back pain - has a stim device - working with palliative for pain management - oxycodone 36 mg BID long acting - oxycodone 10 mg every 4 hours - flexeril, lidocaine, and back stimulator - pain location is primarily the back   Review of Systems   Social History   Tobacco Use  Smoking Status Never Smoker  Smokeless Tobacco Never Used        Objective:    BP Readings from Last 3 Encounters:  08/19/20 120/78  08/04/20 124/80  05/23/20 (!) 139/93   Wt Readings from Last 3 Encounters:  08/19/20 (!) 350 lb 8 oz (159 kg)  08/04/20 (!) 348 lb (157.9 kg)  07/18/20 (!) 370 lb (167.8 kg)    BP 120/78   Pulse (!) 112   Temp 97.6 F (36.4 C) (Temporal)   Ht 6\' 5"  (1.956 m)   Wt (!) 350 lb 8 oz (159 kg)   SpO2 93%   BMI 41.56 kg/m    Physical Exam Constitutional:      Appearance: Normal appearance. He is obese. He is not ill-appearing or diaphoretic.  HENT:     Right Ear: External ear normal.     Left Ear: External ear normal.     Nose: Nose normal.  Eyes:     General: No scleral icterus.    Extraocular Movements: Extraocular movements intact.     Conjunctiva/sclera: Conjunctivae normal.  Cardiovascular:     Rate and Rhythm: Normal rate and regular rhythm.  Pulmonary:     Effort: Pulmonary effort is normal. No respiratory distress.     Breath sounds: Normal breath sounds. No wheezing.  Musculoskeletal:     Cervical back: Neck supple.     Right lower leg: Edema (1+) present.     Left lower leg: Edema (trace) present.  Skin:    General: Skin is warm and dry.     Comments: Venous stasis skin changes on b/l legs  Neurological:     Mental Status: He is alert. Mental status is at baseline.     Comments: In electric scooter  Psychiatric:        Mood and Affect:  Mood normal.           Assessment & Plan:   Problem List Items Addressed This Visit      Cardiovascular and Mediastinum   Chronic diastolic CHF (congestive heart failure) ([place]) - Primary    Follows with cardiology. Cont lasix 40 mg. Appears stable on exam today.       Essential hypertension    BP controlled. Cont furosemide. Notes other medications stopped due to hypotension.       Paroxysmal atrial fibrillation (H[facility])    Regular today. Cont eliquis 5 mg bid        Endocrine   Acquired hypothyroidism    Follows with endocrinology. Cont levothyroxine 137 mcg Lab Results  Component Value Date   TSH 3.20 08/04/2020         Hypogonadotropic hypogonadism ([facility])    Cont testosterone. Follows with endo. stable        Nervous and Auditory   Primary parkinsonism ([place])    Progressive. Following with Dr. Anna Genre and palliative. Wheel Chair bound. Worsening over the last year.  Motor skills are significantly declining. Unable to work with PT 2/2 to chronic back pain        Musculoskeletal and Integument   Gouty arthritis    Notes he is only taking prn allopurinol when has flares. Follows with ortho. Cont prn treatment        Other   Chronic back pain    Most limiting and greatest source of pain. Working with palliative on pain regimen which is helping. Appreciate palliative support.       Localized swelling of lower extremity    Chronic right side > left. At baseline on exam today.       Dysuria    Recently treated for UTI. Then persistent symptoms but UCx with mixed flora. Completed abx w/o improvement. Discussed that likely not infection and advised urology. He will call Dr. Jackson Latino office.           Return in about 6 months (around 02/16/2021).  [name], MD  This visit occurred during the SARS-CoV-2 public health emergency.  Safety protocols were in place, including screening questions prior to the visit, additional usage of staff PPE, and extensive  cleaning of exam room while observing appropriate contact time as indicated for disinfecting solutions.

## 2020-08-19 NOTE — Assessment & Plan Note (Signed)
BP controlled. Cont furosemide. Notes other medications stopped due to hypotension.

## 2020-08-20 ENCOUNTER — Other Ambulatory Visit: Payer: Medicare Other | Admitting: Adult Health Nurse Practitioner

## 2020-08-20 DIAGNOSIS — M5441 Lumbago with sciatica, right side: Secondary | ICD-10-CM | POA: Diagnosis not present

## 2020-08-20 DIAGNOSIS — G8929 Other chronic pain: Secondary | ICD-10-CM

## 2020-08-20 DIAGNOSIS — N390 Urinary tract infection, site not specified: Secondary | ICD-10-CM

## 2020-08-20 DIAGNOSIS — Z515 Encounter for palliative care: Secondary | ICD-10-CM

## 2020-08-20 DIAGNOSIS — M5442 Lumbago with sciatica, left side: Secondary | ICD-10-CM | POA: Diagnosis not present

## 2020-08-20 NOTE — Progress Notes (Signed)
[facility] Palliative Care Consult Note Telephone: [phone]  Fax: [phone]  PATIENT NAME: Dennis Van Sr. DOB: October 25, 1945 MRN: [id number]  PRIMARY CARE PROVIDER:   [name], MD  REFERRING PROVIDER:  No referring provider defined for this encounter.  RESPONSIBLE PARTY:   Joshva Labreck, wife [phone]  Chief complaint:Follow up palliative visit/possible UTI   RECOMMENDATIONS and PLAN:  1.  Advanced care planning.  Patient is DNR  2.  Pain.  Patient needed refill on Xtampza and called yesterday for this.  Checked [place] substance abuse data base and sent in refill to [facility] on [address] in [place].  Later today pharmacy informed patient that they would not have this til Monday.  Instead of using another pharmacy patient would like to use his PRN oxycodone til then.  Have encouraged to take it every 4 hours to stay on top of the pain til Monday.  Have also discussed using Miralax daily to help with constipation.    3.  Recurrent UTI.  Have reached out to patient's urologist via [place] email with concerns for his ongoing urinary issues, see below, to see if he can be seen soon.    4.  Support.  With his increasing difficulty with transfers have discussed using medical transport.  [facility] does not come to his area as he lives in [place] but most of his appointments are in [place].  Family needing more help in home with him.  Have put in SW referral to help with resources  Palliative will continue to monitor for symptom management/decline and make recommendations as needed.  Follow up in 4 weeks.  Encouraged to call with any questions or concerns.  I spent 60 minutes providing this consultation, including time spent with patient/family, provider coordination, chart review, documentation. More than 50% of the time in this consultation was spent coordinating communication.   HISTORY OF PRESENT ILLNESS:  Dennis Lanum Lilley Sr. is a  75 y.o. year old male with multiple medical problems including Parkinson's disease, CHF, Addison's disease, CVA, MI, sleep apnea, hypertension, hyperlipidemia, hypothyroidism, gout, history of testicular cancer and prostate cancer. Palliative Care was asked to help address goals of care. Patient has been having dark urine with an odor, urgency, will fill his urinal 3-4 times per day for past 4 months.  He has been treated 3 times for UTI and has had hydrocele of right testicle that has resolved in that time.  He drinks about 6-8 cups of water per day along with other fluids.  He is not eating much.  States that things do not taste good.  He continues to have back pain which he gets some relief with Xtampza, PRN oxycodone, flexeril, and lidocaine patches.  He does have back stimulator as well.  Patient will have chills and sweats.  This has been ongoing for a long time.  Denies fever.  He has constipation and takes miralax with relief, though does not take it consistently.  He does get tremor in his upper body at times.  Though Parkinson's seems mostly stable on present dose of sinemet.  Denies falls.  Patient is having more difficulty with transfers and ADLs and is requiring more help.  CODE STATUS: DNR  PPS: 40% HOSPICE ELIGIBILITY/DIAGNOSIS: TBD  PHYSICAL EXAM:  BP 146/88  HR 74 O2 99% on RA General: NAD, frail appearing Eyes:  Sclera anicteric and noninjected with no discharge noted ENMT: moist mucous membranes Cardiovascular: regular rate and rhythm Pulmonary:  lung sounds clear; normal respiratory effort Abdomen: soft, nontender, hypoactive bowel sounds GU: no suprapubic tenderness; tea colored urine Extremities: 1+ edema to bilateral feet, no joint deformities Skin: no rashes on exposed skin Neurological: Weakness; having forgetfulness  PAST MEDICAL HISTORY:  Past Medical History:  Diagnosis Date  . Addison's disease ([place]) 01/2016  . Arthritis    low back - DDD  . Cataract 2019    corrected with surgery  . Cellulitis, scrotum 08/02/2014  . Chronic diastolic CHF (congestive heart failure) ([place])    Echo 12/18: severe LVH, EF 60-65, Gr 1 DD // Echo 5/18: EF 65-70, Gr 1 DD  . Chronic lower back pain    "from [place] 2007"  . Complication of anesthesia    Sometimes has N&V /w anesth.   . Coronary artery disease    NSTEMI >> LHC 9/01: prox and mid LAD 50-70; mRCA 40 >> med Rx // Nuc 8/13 [facility]):  no infarct or ischemia, EF 59  . Elevated PSA   . Epididymitis, left 08/04/2014  . History of chronic bronchitis   . History of gout   . History of stroke 01/22/2016   2004 - "right brain stem; no residual " // 2017  . Hypertension   . Hypocholesteremia   . Hypothyroidism   . Ileitis 11/18/2018  . Infection of skin due to methicillin resistant Staphylococcus aureus (MRSA) 12/28/2017  . Kidney stone   . Myocardial infarction [facility]) 2001   2001- cardiac cath., cardiac clearanece note dr Otho Perl 05-14-13 on chart, stress test results 02-21-12 on chart  . OSA on CPAP    cpap setting of 10  . Parkinson's disease ([place])    stage 4   . Pneumonia 2000's and 2013  . PONV (postoperative nausea and vomiting)   . [facility] spotted fever   . Stroke (cerebrum) ([place])   . Testicular cancer ([place]) 2015    SOCIAL HX:  Social History   Tobacco Use  . Smoking status: Never Smoker  . Smokeless tobacco: Never Used  Substance Use Topics  . Alcohol use: Not Currently    ALLERGIES:  Allergies  Allergen Reactions  . Bee Venom Anaphylaxis  . Shrimp [Shellfish Allergy] Anaphylaxis and Other (See Comments)    "just the protein in the shrimp"  . Stadol [Butorphanol] Anaphylaxis, Shortness Of Breath and Other (See Comments)    Respiratory distress, couldn't breathe, cardiac arrest  . Wasp Venom Anaphylaxis  . Other Hives     PERTINENT MEDICATIONS:  Outpatient Encounter Medications as of 08/20/2020  Medication Sig  . allopurinol (ZYLOPRIM) 300 MG tablet Take 450 mg by mouth daily.   Marland Kitchen  apixaban (ELIQUIS) 5 MG TABS tablet Take 1 tablet (5 mg total) by mouth 2 (two) times daily.  . carbidopa-levodopa (SINEMET IR) 25-250 MG tablet TAKE 2 TABLETS BY MOUTH 4  TIMES DAILY  . colchicine 0.6 MG tablet TAKE 1 TABLET BY MOUTH TWICE DAILY (Patient taking differently: Take 0.6 mg by mouth 2 (two) times daily.)  . cyclobenzaprine (FLEXERIL) 5 MG tablet Take 5 mg by mouth 3 (three) times daily as needed.  . DULoxetine (CYMBALTA) 60 MG capsule Take 1 capsule (60 mg total) by mouth daily.  Marland Kitchen EPINEPHrine 0.3 mg/0.3 mL IJ SOAJ injection Inject 0.3 mg into the muscle as needed (allergic reaction).  . furosemide (LASIX) 40 MG tablet TAKE 1 TABLET(40 MG) BY MOUTH DAILY  . hydrocortisone (CORTEF) 10 MG tablet TAKE 2 TABLETS BY MOUTH EVERY MORNING AND 1 TABLET AT 5  PM EVERY DAY  . levothyroxine (SYNTHROID) 137 MCG tablet TAKE 2 TABLETS BY MOUTH  DAILY BEFORE BREAKFAST AND TAKE 1 EXTRA TABLET WEEKLY  . lidocaine (LIDODERM) 5 % APPLY 1 PATCH TO SKIN EVERY DAY. REMOVE AND DISCARD AFTER 12 HOURS OR AS DIRECTED BY MD  . Multiple Vitamin (MULTIVITAMIN WITH MINERALS) TABS tablet Take 1 tablet by mouth daily.  Marland Kitchen NEEDLE, DISP, 22 G 22G X 1" MISC Use every 14 days  . oxyCODONE-acetaminophen (PERCOCET) 10-325 MG tablet Take 1 tablet by mouth every 4 (four) hours as needed for pain.  . OXYCONTIN 20 MG 12 hr tablet 27 mg.  . potassium chloride (K-DUR) 10 MEQ tablet Take 1 tablet (10 mEq total) by mouth daily.  . rosuvastatin (CRESTOR) 20 MG tablet Take 1 tablet (20 mg total) by mouth daily.  . Syringe/Needle, Disp, (SYRINGE 3CC/21GX1") 21G X 1" 3 ML MISC Use every 14 days  . testosterone cypionate (DEPO-TESTOSTERONE) 200 MG/ML injection Inject 0.6 mLs (120 mg total) into the muscle every 14 (fourteen) days.  Marland Kitchen topiramate (TOPAMAX) 50 MG tablet TAKE 1 TABLET BY MOUTH  TWICE DAILY   No facility-administered encounter medications on file as of 08/20/2020.     Amy Jenetta Downer, NP

## 2020-08-28 ENCOUNTER — Telehealth: Payer: Self-pay | Admitting: Adult Health Nurse Practitioner

## 2020-08-28 NOTE — Telephone Encounter (Signed)
Returned patient's VM.  He is needing letter of what palliative is doing with him for his provider at the [place].  Will write letter and have put on office letterhead.  Will give to patient at next visit. Amy K. Olena Heckle NP

## 2020-08-29 ENCOUNTER — Encounter (HOSPITAL_COMMUNITY): Payer: Self-pay | Admitting: Emergency Medicine

## 2020-08-29 ENCOUNTER — Emergency Department (HOSPITAL_COMMUNITY): Payer: No Typology Code available for payment source

## 2020-08-29 ENCOUNTER — Emergency Department (HOSPITAL_COMMUNITY)
Admission: EM | Admit: 2020-08-29 | Discharge: 2020-08-29 | Disposition: A | Discharge status: Home / Self Care | Payer: No Typology Code available for payment source | Attending: Orthopedic Surgery | Admitting: Orthopedic Surgery

## 2020-08-29 ENCOUNTER — Other Ambulatory Visit: Payer: Self-pay

## 2020-08-29 DIAGNOSIS — I5032 Chronic diastolic (congestive) heart failure: Secondary | ICD-10-CM | POA: Diagnosis not present

## 2020-08-29 DIAGNOSIS — M1120 Other chondrocalcinosis, unspecified site: Secondary | ICD-10-CM | POA: Insufficient documentation

## 2020-08-29 DIAGNOSIS — Z820 Family history of epilepsy and other diseases of the nervous system: Secondary | ICD-10-CM | POA: Insufficient documentation

## 2020-08-29 DIAGNOSIS — Z91030 Bee allergy status: Secondary | ICD-10-CM | POA: Insufficient documentation

## 2020-08-29 DIAGNOSIS — M1990 Unspecified osteoarthritis, unspecified site: Secondary | ICD-10-CM

## 2020-08-29 DIAGNOSIS — R29810 Facial weakness: Secondary | ICD-10-CM | POA: Diagnosis not present

## 2020-08-29 DIAGNOSIS — M25551 Pain in right hip: Secondary | ICD-10-CM | POA: Diagnosis not present

## 2020-08-29 DIAGNOSIS — N189 Chronic kidney disease, unspecified: Secondary | ICD-10-CM | POA: Diagnosis not present

## 2020-08-29 DIAGNOSIS — Z981 Arthrodesis status: Secondary | ICD-10-CM | POA: Insufficient documentation

## 2020-08-29 DIAGNOSIS — Z87898 Personal history of other specified conditions: Secondary | ICD-10-CM | POA: Diagnosis not present

## 2020-08-29 DIAGNOSIS — M79604 Pain in right leg: Secondary | ICD-10-CM | POA: Diagnosis present

## 2020-08-29 DIAGNOSIS — M25461 Effusion, right knee: Secondary | ICD-10-CM | POA: Insufficient documentation

## 2020-08-29 DIAGNOSIS — R531 Weakness: Secondary | ICD-10-CM | POA: Diagnosis not present

## 2020-08-29 DIAGNOSIS — G8929 Other chronic pain: Secondary | ICD-10-CM | POA: Insufficient documentation

## 2020-08-29 DIAGNOSIS — Z823 Family history of stroke: Secondary | ICD-10-CM | POA: Insufficient documentation

## 2020-08-29 DIAGNOSIS — Z8547 Personal history of malignant neoplasm of testis: Secondary | ICD-10-CM | POA: Diagnosis not present

## 2020-08-29 DIAGNOSIS — E271 Primary adrenocortical insufficiency: Secondary | ICD-10-CM | POA: Insufficient documentation

## 2020-08-29 DIAGNOSIS — M069 Rheumatoid arthritis, unspecified: Secondary | ICD-10-CM | POA: Diagnosis not present

## 2020-08-29 DIAGNOSIS — M1711 Unilateral primary osteoarthritis, right knee: Secondary | ICD-10-CM | POA: Insufficient documentation

## 2020-08-29 DIAGNOSIS — Z79899 Other long term (current) drug therapy: Secondary | ICD-10-CM | POA: Insufficient documentation

## 2020-08-29 DIAGNOSIS — Z885 Allergy status to narcotic agent status: Secondary | ICD-10-CM | POA: Insufficient documentation

## 2020-08-29 DIAGNOSIS — I130 Hypertensive heart and chronic kidney disease with heart failure and stage 1 through stage 4 chronic kidney disease, or unspecified chronic kidney disease: Secondary | ICD-10-CM | POA: Diagnosis not present

## 2020-08-29 DIAGNOSIS — I2510 Atherosclerotic heart disease of native coronary artery without angina pectoris: Secondary | ICD-10-CM | POA: Insufficient documentation

## 2020-08-29 DIAGNOSIS — R52 Pain, unspecified: Secondary | ICD-10-CM | POA: Diagnosis not present

## 2020-08-29 DIAGNOSIS — I252 Old myocardial infarction: Secondary | ICD-10-CM | POA: Diagnosis not present

## 2020-08-29 DIAGNOSIS — Z7901 Long term (current) use of anticoagulants: Secondary | ICD-10-CM | POA: Insufficient documentation

## 2020-08-29 DIAGNOSIS — E230 Hypopituitarism: Secondary | ICD-10-CM | POA: Diagnosis not present

## 2020-08-29 DIAGNOSIS — M79605 Pain in left leg: Secondary | ICD-10-CM | POA: Diagnosis not present

## 2020-08-29 DIAGNOSIS — G20 Parkinson's disease: Secondary | ICD-10-CM | POA: Diagnosis not present

## 2020-08-29 DIAGNOSIS — Z8546 Personal history of malignant neoplasm of prostate: Secondary | ICD-10-CM | POA: Insufficient documentation

## 2020-08-29 DIAGNOSIS — Z833 Family history of diabetes mellitus: Secondary | ICD-10-CM | POA: Diagnosis not present

## 2020-08-29 DIAGNOSIS — M549 Dorsalgia, unspecified: Secondary | ICD-10-CM | POA: Insufficient documentation

## 2020-08-29 DIAGNOSIS — Z8049 Family history of malignant neoplasm of other genital organs: Secondary | ICD-10-CM | POA: Diagnosis not present

## 2020-08-29 DIAGNOSIS — M160 Bilateral primary osteoarthritis of hip: Secondary | ICD-10-CM | POA: Diagnosis not present

## 2020-08-29 DIAGNOSIS — Z8249 Family history of ischemic heart disease and other diseases of the circulatory system: Secondary | ICD-10-CM | POA: Insufficient documentation

## 2020-08-29 DIAGNOSIS — Z8673 Personal history of transient ischemic attack (TIA), and cerebral infarction without residual deficits: Secondary | ICD-10-CM | POA: Insufficient documentation

## 2020-08-29 DIAGNOSIS — M25561 Pain in right knee: Secondary | ICD-10-CM | POA: Diagnosis not present

## 2020-08-29 DIAGNOSIS — M25469 Effusion, unspecified knee: Secondary | ICD-10-CM

## 2020-08-29 DIAGNOSIS — I480 Paroxysmal atrial fibrillation: Secondary | ICD-10-CM | POA: Insufficient documentation

## 2020-08-29 DIAGNOSIS — Z91013 Allergy to seafood: Secondary | ICD-10-CM | POA: Insufficient documentation

## 2020-08-29 LAB — SYNOVIAL CELL COUNT + DIFF, W/ CRYSTALS
Crystals, Fluid: NONE SEEN
Eosinophils-Synovial: 0 % (ref 0–1)
Lymphocytes-Synovial Fld: 2 % (ref 0–20)
Monocyte-Macrophage-Synovial Fluid: 11 % — ABNORMAL LOW (ref 50–90)
Neutrophil, Synovial: 87 % — ABNORMAL HIGH (ref 0–25)
WBC, Synovial: 1865 /mm3 — ABNORMAL HIGH (ref 0–200)

## 2020-08-29 LAB — GRAM STAIN

## 2020-08-29 LAB — CBC WITH DIFFERENTIAL/PLATELET
Abs Immature Granulocytes: 0.03 10*3/uL (ref 0.00–0.07)
Basophils Absolute: 0 10*3/uL (ref 0.0–0.1)
Basophils Relative: 0 %
Eosinophils Absolute: 0 10*3/uL (ref 0.0–0.5)
Eosinophils Relative: 1 %
HCT: 49 % (ref 39.0–52.0)
Hemoglobin: 16 g/dL (ref 13.0–17.0)
Immature Granulocytes: 0 %
Lymphocytes Relative: 11 %
Lymphs Abs: 0.8 10*3/uL (ref 0.7–4.0)
MCH: 27.3 pg (ref 26.0–34.0)
MCHC: 32.7 g/dL (ref 30.0–36.0)
MCV: 83.6 fL (ref 80.0–100.0)
Monocytes Absolute: 0.5 10*3/uL (ref 0.1–1.0)
Monocytes Relative: 7 %
Neutro Abs: 5.6 10*3/uL (ref 1.7–7.7)
Neutrophils Relative %: 81 %
Platelets: 132 10*3/uL — ABNORMAL LOW (ref 150–400)
RBC: 5.86 MIL/uL — ABNORMAL HIGH (ref 4.22–5.81)
RDW: 13.8 % (ref 11.5–15.5)
WBC: 6.9 10*3/uL (ref 4.0–10.5)
nRBC: 0 % (ref 0.0–0.2)

## 2020-08-29 LAB — BASIC METABOLIC PANEL
Anion gap: 11 (ref 5–15)
BUN: 19 mg/dL (ref 8–23)
CO2: 28 mmol/L (ref 22–32)
Calcium: 9.7 mg/dL (ref 8.9–10.3)
Chloride: 94 mmol/L — ABNORMAL LOW (ref 98–111)
Creatinine, Ser: 1.12 mg/dL (ref 0.61–1.24)
GFR, Estimated: >60 mL/min (ref >60)
Glucose, Bld: 120 mg/dL — ABNORMAL HIGH (ref 70–99)
Potassium: 4.1 mmol/L (ref 3.5–5.1)
Sodium: 133 mmol/L — ABNORMAL LOW (ref 135–145)

## 2020-08-29 MED ORDER — HYDROMORPHONE HCL 1 MG/ML IJ SOLN
1.0000 mg | Freq: Once | INTRAMUSCULAR | Status: AC
  Administered 2020-08-29: 1 mg via INTRAVENOUS
  Filled 2020-08-29: qty 1

## 2020-08-29 MED ORDER — METHYLPREDNISOLONE SODIUM SUCC 125 MG IJ SOLR
125.0000 mg | Freq: Once | INTRAMUSCULAR | Status: AC
  Administered 2020-08-29: 125 mg via INTRAVENOUS
  Filled 2020-08-29: qty 2

## 2020-08-29 MED ORDER — LIDOCAINE HCL (PF) 1 % IJ SOLN
30.0000 mL | Freq: Once | INTRAMUSCULAR | Status: AC
  Administered 2020-08-29: 30 mL
  Filled 2020-08-29: qty 30

## 2020-08-29 MED ORDER — HYDROMORPHONE HCL 2 MG/ML IJ SOLN: 2.0000 mg | Freq: Once | INTRAMUSCULAR | Status: DC

## 2020-08-29 MED ORDER — ONDANSETRON HCL 4 MG/2ML IJ SOLN
4.0000 mg | Freq: Once | INTRAMUSCULAR | Status: AC
  Administered 2020-08-29: 4 mg via INTRAVENOUS
  Filled 2020-08-29: qty 2

## 2020-08-29 MED ORDER — PREDNISONE 10 MG PO TABS: 20.0000 mg | ORAL_TABLET | Freq: Every day | ORAL | 0 refills | Status: AC
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
## 2020-08-29 NOTE — ED Triage Notes (Signed)
BIBA Per EMS:  Pt coming from home with pain in R leg; chronic cellulitis; unable to bear wt today  palliative care for parkinson's  Pain 10/10  139/92 110HR 20 RR 99% RA

## 2020-08-29 NOTE — ED Notes (Addendum)
Went to patients room to update vitals however he is gone for a procedure.

## 2020-08-29 NOTE — ED Notes (Signed)
Patient given food/drink per Madelon Lips, PA orders.
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
## 2020-08-29 NOTE — ED Notes (Signed)
Pt placed on 2L O2 per verbal order by Hina, PA. O2 sats currently 96% 2L.

## 2020-08-29 NOTE — ED Notes (Signed)
Patient requested food/drink. Per Madelon Lips, PA orders, hold off on this under lab results come back from knee aspiration.

## 2020-08-31 ENCOUNTER — Telehealth: Payer: Self-pay

## 2020-08-31 ENCOUNTER — Telehealth: Payer: Self-pay | Admitting: Adult Health Nurse Practitioner

## 2020-08-31 NOTE — Telephone Encounter (Signed)
Spoke with wife about increased pain in right knee. He was advised to go to ER when she called [facility]'s main line.

## 2020-08-31 NOTE — Telephone Encounter (Signed)
Patient having Mild to moderate appearing osteoarthritis. Chondrocalcinosis. Small to moderate joint effusion.  Patient has appointment with ortho on Wednesday.  Wife will call me back on Wednesday to update on appointment and will schedule a sooner appointment if needed. Amy K. Olena Heckle NP

## 2020-08-31 NOTE — Telephone Encounter (Signed)
08/31/20 @855AM : Palliative care SW outreached patient/spouse per NP request.  Call unsuccessful;. SW left VM. Awaiting return call.

## 2020-09-01 ENCOUNTER — Emergency Department (HOSPITAL_COMMUNITY): Payer: No Typology Code available for payment source

## 2020-09-01 ENCOUNTER — Encounter (HOSPITAL_COMMUNITY): Payer: Self-pay | Admitting: Emergency Medicine

## 2020-09-01 ENCOUNTER — Telehealth: Payer: Self-pay

## 2020-09-01 ENCOUNTER — Emergency Department (HOSPITAL_BASED_OUTPATIENT_CLINIC_OR_DEPARTMENT_OTHER): Payer: No Typology Code available for payment source

## 2020-09-01 ENCOUNTER — Inpatient Hospital Stay (HOSPITAL_COMMUNITY)
Admission: EM | Admit: 2020-09-01 | Discharge: 2020-09-10 | DRG: 554 | Disposition: A | Discharge status: Skilled Nursing Facility | Payer: No Typology Code available for payment source | Attending: Internal Medicine | Admitting: Internal Medicine

## 2020-09-01 ENCOUNTER — Other Ambulatory Visit: Payer: Self-pay

## 2020-09-01 DIAGNOSIS — E2740 Unspecified adrenocortical insufficiency: Secondary | ICD-10-CM | POA: Diagnosis not present

## 2020-09-01 DIAGNOSIS — M79604 Pain in right leg: Secondary | ICD-10-CM | POA: Diagnosis not present

## 2020-09-01 DIAGNOSIS — E230 Hypopituitarism: Secondary | ICD-10-CM | POA: Diagnosis present

## 2020-09-01 DIAGNOSIS — E7800 Pure hypercholesterolemia, unspecified: Secondary | ICD-10-CM | POA: Diagnosis present

## 2020-09-01 DIAGNOSIS — E039 Hypothyroidism, unspecified: Secondary | ICD-10-CM | POA: Diagnosis present

## 2020-09-01 DIAGNOSIS — Z66 Do not resuscitate: Secondary | ICD-10-CM | POA: Diagnosis present

## 2020-09-01 DIAGNOSIS — E291 Testicular hypofunction: Secondary | ICD-10-CM | POA: Diagnosis present

## 2020-09-01 DIAGNOSIS — Z7901 Long term (current) use of anticoagulants: Secondary | ICD-10-CM

## 2020-09-01 DIAGNOSIS — Z20822 Contact with and (suspected) exposure to covid-19: Secondary | ICD-10-CM | POA: Diagnosis present

## 2020-09-01 DIAGNOSIS — Z91030 Bee allergy status: Secondary | ICD-10-CM

## 2020-09-01 DIAGNOSIS — M1A9XX0 Chronic gout, unspecified, without tophus (tophi): Secondary | ICD-10-CM | POA: Diagnosis present

## 2020-09-01 DIAGNOSIS — R441 Visual hallucinations: Secondary | ICD-10-CM | POA: Diagnosis not present

## 2020-09-01 DIAGNOSIS — I6381 Other cerebral infarction due to occlusion or stenosis of small artery: Secondary | ICD-10-CM | POA: Diagnosis not present

## 2020-09-01 DIAGNOSIS — I5032 Chronic diastolic (congestive) heart failure: Secondary | ICD-10-CM | POA: Diagnosis present

## 2020-09-01 DIAGNOSIS — M79661 Pain in right lower leg: Secondary | ICD-10-CM | POA: Diagnosis not present

## 2020-09-01 DIAGNOSIS — Z833 Family history of diabetes mellitus: Secondary | ICD-10-CM

## 2020-09-01 DIAGNOSIS — Z8673 Personal history of transient ischemic attack (TIA), and cerebral infarction without residual deficits: Secondary | ICD-10-CM

## 2020-09-01 DIAGNOSIS — M5441 Lumbago with sciatica, right side: Secondary | ICD-10-CM | POA: Diagnosis not present

## 2020-09-01 DIAGNOSIS — Z8249 Family history of ischemic heart disease and other diseases of the circulatory system: Secondary | ICD-10-CM

## 2020-09-01 DIAGNOSIS — G894 Chronic pain syndrome: Secondary | ICD-10-CM | POA: Diagnosis present

## 2020-09-01 DIAGNOSIS — I700 Atherosclerosis of aorta: Secondary | ICD-10-CM | POA: Diagnosis not present

## 2020-09-01 DIAGNOSIS — M25561 Pain in right knee: Secondary | ICD-10-CM | POA: Diagnosis present

## 2020-09-01 DIAGNOSIS — Z8701 Personal history of pneumonia (recurrent): Secondary | ICD-10-CM

## 2020-09-01 DIAGNOSIS — R609 Edema, unspecified: Secondary | ICD-10-CM | POA: Diagnosis not present

## 2020-09-01 DIAGNOSIS — G20A1 Parkinson's disease without dyskinesia, without mention of fluctuations: Secondary | ICD-10-CM | POA: Diagnosis present

## 2020-09-01 DIAGNOSIS — I10 Essential (primary) hypertension: Secondary | ICD-10-CM | POA: Diagnosis not present

## 2020-09-01 DIAGNOSIS — R0902 Hypoxemia: Secondary | ICD-10-CM | POA: Diagnosis present

## 2020-09-01 DIAGNOSIS — Z6841 Body Mass Index (BMI) 40.0 and over, adult: Secondary | ICD-10-CM

## 2020-09-01 DIAGNOSIS — Z823 Family history of stroke: Secondary | ICD-10-CM

## 2020-09-01 DIAGNOSIS — M545 Low back pain, unspecified: Secondary | ICD-10-CM | POA: Diagnosis not present

## 2020-09-01 DIAGNOSIS — K4020 Bilateral inguinal hernia, without obstruction or gangrene, not specified as recurrent: Secondary | ICD-10-CM | POA: Diagnosis not present

## 2020-09-01 DIAGNOSIS — M549 Dorsalgia, unspecified: Secondary | ICD-10-CM

## 2020-09-01 DIAGNOSIS — Z79890 Hormone replacement therapy (postmenopausal): Secondary | ICD-10-CM

## 2020-09-01 DIAGNOSIS — Z888 Allergy status to other drugs, medicaments and biological substances status: Secondary | ICD-10-CM

## 2020-09-01 DIAGNOSIS — I480 Paroxysmal atrial fibrillation: Secondary | ICD-10-CM | POA: Diagnosis present

## 2020-09-01 DIAGNOSIS — E785 Hyperlipidemia, unspecified: Secondary | ICD-10-CM | POA: Diagnosis present

## 2020-09-01 DIAGNOSIS — Z79899 Other long term (current) drug therapy: Secondary | ICD-10-CM

## 2020-09-01 DIAGNOSIS — Z8547 Personal history of malignant neoplasm of testis: Secondary | ICD-10-CM

## 2020-09-01 DIAGNOSIS — Z91013 Allergy to seafood: Secondary | ICD-10-CM

## 2020-09-01 DIAGNOSIS — G4733 Obstructive sleep apnea (adult) (pediatric): Secondary | ICD-10-CM | POA: Diagnosis present

## 2020-09-01 DIAGNOSIS — G20 Parkinson's disease: Secondary | ICD-10-CM | POA: Diagnosis present

## 2020-09-01 DIAGNOSIS — G8929 Other chronic pain: Secondary | ICD-10-CM

## 2020-09-01 DIAGNOSIS — Z8546 Personal history of malignant neoplasm of prostate: Secondary | ICD-10-CM

## 2020-09-01 DIAGNOSIS — I252 Old myocardial infarction: Secondary | ICD-10-CM

## 2020-09-01 DIAGNOSIS — I110 Hypertensive heart disease with heart failure: Secondary | ICD-10-CM | POA: Diagnosis present

## 2020-09-01 DIAGNOSIS — E271 Primary adrenocortical insufficiency: Secondary | ICD-10-CM | POA: Diagnosis present

## 2020-09-01 DIAGNOSIS — M1711 Unilateral primary osteoarthritis, right knee: Secondary | ICD-10-CM | POA: Diagnosis not present

## 2020-09-01 DIAGNOSIS — I2510 Atherosclerotic heart disease of native coronary artery without angina pectoris: Secondary | ICD-10-CM | POA: Diagnosis present

## 2020-09-01 DIAGNOSIS — J9811 Atelectasis: Secondary | ICD-10-CM | POA: Diagnosis not present

## 2020-09-01 DIAGNOSIS — Z96652 Presence of left artificial knee joint: Secondary | ICD-10-CM | POA: Diagnosis present

## 2020-09-01 DIAGNOSIS — Z79891 Long term (current) use of opiate analgesic: Secondary | ICD-10-CM

## 2020-09-01 DIAGNOSIS — Z993 Dependence on wheelchair: Secondary | ICD-10-CM

## 2020-09-01 LAB — CBC
HCT: 48 % (ref 39.0–52.0)
Hemoglobin: 15.6 g/dL (ref 13.0–17.0)
MCH: 27.6 pg (ref 26.0–34.0)
MCHC: 32.5 g/dL (ref 30.0–36.0)
MCV: 85 fL (ref 80.0–100.0)
Platelets: 203 10*3/uL (ref 150–400)
RBC: 5.65 MIL/uL (ref 4.22–5.81)
RDW: 13.7 % (ref 11.5–15.5)
WBC: 9.8 10*3/uL (ref 4.0–10.5)
nRBC: 0 % (ref 0.0–0.2)

## 2020-09-01 LAB — BASIC METABOLIC PANEL
Anion gap: 11 (ref 5–15)
BUN: 23 mg/dL (ref 8–23)
CO2: 25 mmol/L (ref 22–32)
Calcium: 9.5 mg/dL (ref 8.9–10.3)
Chloride: 96 mmol/L — ABNORMAL LOW (ref 98–111)
Creatinine, Ser: 1.17 mg/dL (ref 0.61–1.24)
GFR, Estimated: >60 mL/min (ref >60)
Glucose, Bld: 142 mg/dL — ABNORMAL HIGH (ref 70–99)
Potassium: 4.2 mmol/L (ref 3.5–5.1)
Sodium: 132 mmol/L — ABNORMAL LOW (ref 135–145)

## 2020-09-01 LAB — D-DIMER, QUANTITATIVE: D-Dimer, Quant: 1.14 ug/mL-FEU — ABNORMAL HIGH (ref 0.00–0.50)

## 2020-09-01 LAB — CK: Total CK: 22 U/L — ABNORMAL LOW (ref 49–397)

## 2020-09-01 LAB — TROPONIN I (HIGH SENSITIVITY)
Troponin I (High Sensitivity): 10 ng/L (ref <18)
Troponin I (High Sensitivity): 5 ng/L (ref <18)

## 2020-09-01 LAB — LACTIC ACID, PLASMA: Lactic Acid, Venous: 0.9 mmol/L (ref 0.5–1.9)

## 2020-09-01 LAB — SARS CORONAVIRUS 2 (TAT 6-24 HRS): SARS Coronavirus 2: NEGATIVE

## 2020-09-01 LAB — GLUCOSE, BODY FLUID OTHER: Glucose, Body Fluid Other: 120 mg/dL

## 2020-09-01 LAB — PROTEIN, BODY FLUID (OTHER): Total Protein, Body Fluid Other: 4 g/dL

## 2020-09-01 MED ORDER — TOPIRAMATE 25 MG PO TABS
50.0000 mg | ORAL_TABLET | Freq: Two times a day (BID) | ORAL | Status: DC
  Administered 2020-09-01 – 2020-09-10 (×18): 50 mg via ORAL
  Filled 2020-09-01 (×19): qty 2

## 2020-09-01 MED ORDER — SODIUM CHLORIDE 0.9 % IV SOLN
250.0000 mL | INTRAVENOUS | Status: DC | PRN
Start: 2020-09-01 — End: 2020-09-10

## 2020-09-01 MED ORDER — ACETAMINOPHEN 325 MG PO TABS
650.0000 mg | ORAL_TABLET | Freq: Four times a day (QID) | ORAL | Status: DC | PRN
  Administered 2020-09-04 – 2020-09-06 (×2): 650 mg via ORAL
  Filled 2020-09-01 (×2): qty 2

## 2020-09-01 MED ORDER — LEVOTHYROXINE SODIUM 25 MCG PO TABS: 137.0000 ug | ORAL_TABLET | ORAL | Status: DC

## 2020-09-01 MED ORDER — ALLOPURINOL 300 MG PO TABS
450.0000 mg | ORAL_TABLET | Freq: Every day | ORAL | Status: DC
  Administered 2020-09-02 – 2020-09-10 (×9): 450 mg via ORAL
  Filled 2020-09-01 (×9): qty 2

## 2020-09-01 MED ORDER — SODIUM CHLORIDE 0.9% FLUSH
3.0000 mL | Freq: Two times a day (BID) | INTRAVENOUS | Status: DC
  Administered 2020-09-02 – 2020-09-10 (×18): 3 mL via INTRAVENOUS

## 2020-09-01 MED ORDER — BISACODYL 10 MG RE SUPP
10.0000 mg | Freq: Every day | RECTAL | Status: DC | PRN
Start: 2020-09-01 — End: 2020-09-10

## 2020-09-01 MED ORDER — POLYETHYLENE GLYCOL 3350 17 G PO PACK: 17.0000 g | PACK | Freq: Every day | ORAL | Status: DC | PRN

## 2020-09-01 MED ORDER — HYDROCORTISONE 20 MG PO TABS
20.0000 mg | ORAL_TABLET | Freq: Every day | ORAL | Status: DC
  Administered 2020-09-02 – 2020-09-10 (×9): 20 mg via ORAL
  Filled 2020-09-01 (×9): qty 1

## 2020-09-01 MED ORDER — LEVOTHYROXINE SODIUM 50 MCG PO TABS
274.0000 ug | ORAL_TABLET | Freq: Every day | ORAL | Status: DC
  Administered 2020-09-02 – 2020-09-10 (×9): 274 ug via ORAL
  Filled 2020-09-01 (×9): qty 2

## 2020-09-01 MED ORDER — ONDANSETRON HCL 4 MG PO TABS: 4.0000 mg | ORAL_TABLET | Freq: Four times a day (QID) | ORAL | Status: DC | PRN

## 2020-09-01 MED ORDER — OXYCODONE-ACETAMINOPHEN 10-325 MG PO TABS: 1.0000 | ORAL_TABLET | ORAL | Status: DC | PRN

## 2020-09-01 MED ORDER — LIDOCAINE 5 % EX PTCH
1.0000 | MEDICATED_PATCH | CUTANEOUS | Status: DC
  Administered 2020-09-01: 1 via TRANSDERMAL
  Filled 2020-09-01: qty 1

## 2020-09-01 MED ORDER — CARBIDOPA-LEVODOPA 25-250 MG PO TABS
2.0000 | ORAL_TABLET | Freq: Four times a day (QID) | ORAL | Status: DC
  Administered 2020-09-01 – 2020-09-10 (×34): 2 via ORAL
  Filled 2020-09-01 (×40): qty 2

## 2020-09-01 MED ORDER — CYCLOBENZAPRINE HCL 5 MG PO TABS
5.0000 mg | ORAL_TABLET | Freq: Three times a day (TID) | ORAL | Status: DC | PRN
  Administered 2020-09-02 – 2020-09-06 (×3): 5 mg via ORAL
  Filled 2020-09-01 (×3): qty 1

## 2020-09-01 MED ORDER — SODIUM CHLORIDE 0.9% FLUSH: 3.0000 mL | INTRAVENOUS | Status: DC | PRN

## 2020-09-01 MED ORDER — COLCHICINE 0.6 MG PO TABS
0.6000 mg | ORAL_TABLET | Freq: Every day | ORAL | Status: DC | PRN
  Administered 2020-09-06: 0.6 mg via ORAL
  Filled 2020-09-01: qty 1

## 2020-09-01 MED ORDER — OXYCODONE ER 36 MG PO C12A: 1.0000 | EXTENDED_RELEASE_CAPSULE | Freq: Two times a day (BID) | ORAL | Status: DC

## 2020-09-01 MED ORDER — SENNA 8.6 MG PO TABS
1.0000 | ORAL_TABLET | Freq: Two times a day (BID) | ORAL | Status: DC
  Administered 2020-09-01 – 2020-09-03 (×4): 8.6 mg via ORAL
  Filled 2020-09-01 (×4): qty 1

## 2020-09-01 MED ORDER — ROSUVASTATIN CALCIUM 20 MG PO TABS
20.0000 mg | ORAL_TABLET | Freq: Every day | ORAL | Status: DC
  Administered 2020-09-01 – 2020-09-10 (×10): 20 mg via ORAL
  Filled 2020-09-01 (×10): qty 1

## 2020-09-01 MED ORDER — FENTANYL CITRATE (PF) 100 MCG/2ML IJ SOLN
50.0000 ug | Freq: Once | INTRAMUSCULAR | Status: AC
  Administered 2020-09-01: 50 ug via INTRAVENOUS
  Filled 2020-09-01: qty 2

## 2020-09-01 MED ORDER — LEVOTHYROXINE SODIUM 25 MCG PO TABS
137.0000 ug | ORAL_TABLET | ORAL | Status: DC
  Administered 2020-09-10: 137 ug via ORAL

## 2020-09-01 MED ORDER — OXYCODONE-ACETAMINOPHEN 5-325 MG PO TABS: 1.0000 | ORAL_TABLET | ORAL | Status: DC | PRN

## 2020-09-01 MED ORDER — OXYCODONE HCL ER 10 MG PO T12A
20.0000 mg | EXTENDED_RELEASE_TABLET | Freq: Two times a day (BID) | ORAL | Status: DC
  Administered 2020-09-01 – 2020-09-02 (×2): 20 mg via ORAL
  Filled 2020-09-01 (×2): qty 2

## 2020-09-01 MED ORDER — ACETAMINOPHEN 650 MG RE SUPP: 650.0000 mg | Freq: Four times a day (QID) | RECTAL | Status: DC | PRN

## 2020-09-01 MED ORDER — FUROSEMIDE 40 MG PO TABS
40.0000 mg | ORAL_TABLET | Freq: Every day | ORAL | Status: DC
  Administered 2020-09-02 – 2020-09-10 (×9): 40 mg via ORAL
  Filled 2020-09-01 (×9): qty 1

## 2020-09-01 MED ORDER — OXYCODONE HCL 5 MG PO TABS: 5.0000 mg | ORAL_TABLET | ORAL | Status: DC | PRN

## 2020-09-01 MED ORDER — ONDANSETRON HCL 4 MG/2ML IJ SOLN: 4.0000 mg | Freq: Four times a day (QID) | INTRAMUSCULAR | Status: DC | PRN

## 2020-09-01 MED ORDER — DULOXETINE HCL 60 MG PO CPEP
60.0000 mg | ORAL_CAPSULE | Freq: Every day | ORAL | Status: DC
  Administered 2020-09-01 – 2020-09-10 (×10): 60 mg via ORAL
  Filled 2020-09-01 (×10): qty 1

## 2020-09-01 MED ORDER — HYDROCORTISONE 10 MG PO TABS
10.0000 mg | ORAL_TABLET | Freq: Every day | ORAL | Status: DC
  Administered 2020-09-01 – 2020-09-09 (×8): 10 mg via ORAL
  Filled 2020-09-01 (×10): qty 1

## 2020-09-01 NOTE — ED Notes (Signed)
Report given to Nancy,RN on 6N.

## 2020-09-01 NOTE — ED Triage Notes (Signed)
Pt to triage via [facility] from home.  Reports sciatica and chronic pain to R hip and R knee pain.  Reports increased R hip pain that radiates to R knee and R knee swelling since Saturday night.  Unable to ambulate due to pain.  ON EMS arrival O2 sats 80 % on room air.  Pt denies SOB.  95% on 2 liters [place].  EMS administered Fentanyl 200 mcg.  20 g RFA.

## 2020-09-01 NOTE — Telephone Encounter (Signed)
09/01/20 @10AM : Palliative care SW returning spouse phone call.  Call unsuccessful;. SW left VM. Awaiting return call.

## 2020-09-01 NOTE — CV Procedure (Signed)
RLE venous duplex completed. Preliminary results given to Providence Lanius, PA.  Results can be found under chart review under CV PROC. 09/01/2020 8:02 PM Priest Lockridge RVT, RDMS

## 2020-09-01 NOTE — ED Notes (Signed)
Attempted report, informed nurse will call me back.

## 2020-09-01 NOTE — ED Notes (Signed)
Attempted to call to give report, giving a busy signal when I called

## 2020-09-01 NOTE — H&P (Addendum)
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 Sheffield MD   On: 09/01/2020 18:45   CT Renal Stone Study  Result Date: 09/01/2020 CLINICAL DATA:  Right flank pain EXAM: CT ABDOMEN AND PELVIS WITHOUT CONTRAST TECHNIQUE: Multidetector CT imaging of the abdomen and pelvis was performed following the standard protocol without IV contrast. COMPARISON:  CT 02/08/2018 FINDINGS: Lower chest: Lung bases demonstrate no acute consolidation or effusion. Bilateral gynecomastia. Coronary vascular calcification. Hepatobiliary: No focal liver abnormality is seen. No gallstones, gallbladder wall thickening, or biliary dilatation. Pancreas: Unremarkable. No pancreatic ductal dilatation  or surrounding inflammatory changes. Spleen: Normal in size without focal abnormality. Adrenals/Urinary Tract: Adrenal glands are unremarkable. Kidneys show no hydronephrosis. Negative for ureteral stone. Bladder is unremarkable Stomach/Bowel: Stomach is within normal limits. Appendix appears normal. No evidence of bowel wall thickening, distention, or inflammatory changes. Vascular/Lymphatic: Mild aortic atherosclerosis. No aneurysm. No suspicious nodes Reproductive: Negative for mass. Other: Negative for free  air or free fluid. Right greater than left fat containing inguinal hernias. Musculoskeletal: Orthopedic hardware L2 through L5, see separately dictated lumbar spine CT report. Ascending thoracic stimulator leads terminating at T7-T8. IMPRESSION: 1. Negative for hydronephrosis or ureteral stone. 2. No CT evidence for acute intra-abdominal or pelvic abnormality. Aortic Atherosclerosis (ICD10-I70.0). Electronically Signed   By: Donavan Foil M.D.   On: 09/01/2020 19:07   VAS [name] LOWER EXTREMITY VENOUS (DVT) (ONLY [facility] & [facility])  Result Date: 09/01/2020  Lower Venous DVT Study Indications: RLE pain.  Risk Factors: HX of arthritis and sciatica. Comparison Study: Last exam was on 07/16/18 - negative Performing Technologist: Rogelia Rohrer  Examination Guidelines: A complete evaluation includes B-mode imaging, spectral Doppler, color Doppler, and power Doppler as needed of all accessible portions of each vessel. Bilateral testing is considered an integral part of a complete examination. Limited examinations for reoccurring indications may be performed as noted. The reflux portion of the exam is performed with the patient in reverse Trendelenburg.  +---------+---------------+---------+-----------+----------+--------------+ RIGHT    CompressibilityPhasicitySpontaneityPropertiesThrombus Aging +---------+---------------+---------+-----------+----------+--------------+ CFV      Full           Yes      Yes                                  +---------+---------------+---------+-----------+----------+--------------+ SFJ      Full                                                        +---------+---------------+---------+-----------+----------+--------------+ FV Prox  Full           Yes      Yes                                 +---------+---------------+---------+-----------+----------+--------------+ FV Mid   Full           Yes      Yes                                 +---------+---------------+---------+-----------+----------+--------------+ FV DistalFull           Yes      Yes                                 +---------+---------------+---------+-----------+----------+--------------+ PFV      Full                                                        +---------+---------------+---------+-----------+----------+--------------+ POP      Full           Yes      Yes                                 +---------+---------------+---------+-----------+----------+--------------+ PTV      Full                                                        +---------+---------------+---------+-----------+----------+--------------+  PERO     Full                                                        +---------+---------------+---------+-----------+----------+--------------+   +----+---------------+---------+-----------+----------+--------------+ LEFTCompressibilityPhasicitySpontaneityPropertiesThrombus Aging +----+---------------+---------+-----------+----------+--------------+ CFV Full           Yes      Yes                                 +----+---------------+---------+-----------+----------+--------------+     Summary: RIGHT: - There is no evidence of deep vein thrombosis in the lower extremity. - There is no evidence of superficial venous thrombosis.  - No cystic structure found in the popliteal fossa.  LEFT: - No evidence of common femoral vein obstruction.  *See table(s) above  for measurements and observations.    Preliminary     Chart has been reviewed    Assessment/Plan     74 y.o. male with medical history significant of chronic diastolic CHF, HTN, hypogonadotrophic hypogonadism, Parkinson disease, paroxysmal atrial fibrillation on anticoagulation, hypothyroidism, gout, chronic right knee pain, chronic pain syndrome, Admitted for right knee pain severe requiring IV narcotics for pain control inability to ambulate  Present on Admission: . Right knee pain -multifactorial there is evidence of fluid reaccumulation on physical exam.  Prior fluid showed more of inflammatory markers did not respond completely to steroids.  Appreciate orthopedics consult At this point no fever white blood cell count elevation to suggest infectious process Have to consider possible hematoma in setting of recent procedure .  Hold Lovenox and hold anticoagulation for tonight  . Primary parkinsonism ([place]) -chronic stable continue home medication Will need PT OT assessment prior to discharge   . Paroxysmal atrial fibrillation (HCC) hold anticoagulation for tonight as probably will need procedures done to the knee as well as to assess for any possibility of hematoma  . Morbid obesity ([place]) -no need to follow-up as an outpatient nutrition  . 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 Hypercholesterolemia -chronic stable continue crest  . Essential hypertension -has not been able to tolerate other than Lasix will continue  . Chronic diastolic CHF (congestive heart failure) (HCC) continue Lasix and monitor fluid Balance  . CAD (coronary artery disease) -chronic continue statin currently no chest pain Does not tolerate beta-blocker  . Adrenal insufficiency ([place]) continue Cortef  . Acquired hypothyroidism continue home medication check TSH  History of chronic pain continue home medications and add IV fentanyl as needed monitor for  oversedation  Transient hypoxia -currently on room air no monitor for any evidence of hypoxia with pain medication administration.    Other plan as per orders.  DVT prophylaxis:  SCD      Code Status:    Code Status: Prior FULL CODE  as per patient   I had personally discussed CODE STATUS with patient     Family Communication:   Family not at  Bedside    Disposition Plan:   likely will need placement for rehabilitation                    Following barriers for discharge:  Electrolytes corrected                                                           Pain controlled with PO medications                                                        Will likely need home health                            Will need consultants to evaluate patient prior to discharge                       Would benefit from PT/OT eval prior to [place] called: orthopedics    Admission status:  ED Disposition    ED Disposition Condition [place]: [place] [[id number]]  Level of Care: Med-Surg [16]  I expect the patient will be discharged within 24 hours: No (not a candidate for 5C-Observation unit)  Covid Evaluation: Asymptomatic Screening Protocol (No Symptoms)  Diagnosis: Right knee pain [[id number]]  Admitting Physician: Toy Baker [[id number]]  Attending Physician: Toy Baker [[id number]]        Obs   Level of care      medical floor    Lab Results  Component Value Date   [place] NEGATIVE 06/21/2019     Precautions: admitted as  Covid Negative     PPE: Used by the provider:   N95   eye Goggles,  Gloves    Lorissa Kishbaugh 09/02/2020, 12:59 AM     [facility]     after 2 AM please page floor coverage PA If 7AM-7PM, please contact the day team taking care of the patient using [email or web address]   Patient was evaluated in the context of the global COVID-19  pandemic, which necessitated consideration that the patient might be at risk for infection with the SARS-CoV-2 virus that causes COVID-19. Institutional protocols and algorithms that pertain to the evaluation of patients at risk for COVID-19 are in a state of rapid change based on information released by regulatory bodies including the CDC and federal and state organizations. These policies and algorithms were followed during the patient's care.

## 2020-09-01 NOTE — ED Provider Notes (Signed)
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 administration in time range)  cyclobenzaprine (FLEXERIL) tablet 5 mg (has no administration in time range)  DULoxetine (CYMBALTA) DR capsule 60 mg (60 mg Oral Given 09/01/20 2227)  furosemide (LASIX) tablet 40 mg (40 mg Oral Patient Refused/Not Given 09/01/20 2230)  hydrocortisone (CORTEF) tablet 20 mg (has no administration in time range)  lidocaine (LIDODERM) 5 % 1 patch (has no administration in time range)  oxyCODONE (OXYCONTIN) 12 hr tablet 20 mg (20 mg Oral Given 09/01/20 2225)  rosuvastatin (CRESTOR) tablet 20 mg (has no administration in time range)  topiramate (TOPAMAX) tablet 50 mg (50 mg Oral Given 09/01/20 2224)  acetaminophen (TYLENOL) tablet 650 mg (has no administration in time range)    Or  acetaminophen (TYLENOL) suppository 650 mg (has no administration in time range)  sodium chloride flush (NS) 0.9 % injection 3 mL (has no administration in time range)  sodium chloride flush (NS) 0.9 % injection 3 mL (has no  administration in time range)  0.9 %  sodium chloride infusion (has no administration in time range)  senna (SENOKOT) tablet 8.6 mg (8.6 mg Oral Given 09/01/20 2225)  polyethylene glycol (MIRALAX / GLYCOLAX) packet 17 g (has no administration in time range)  bisacodyl (DULCOLAX) suppository 10 mg (has no administration in time range)  ondansetron (ZOFRAN) tablet 4 mg (has no administration in time range)    Or  ondansetron (ZOFRAN) injection 4 mg (has no administration in time range)  hydrocortisone (CORTEF) tablet 10 mg (10 mg Oral Given 09/01/20 2228)  oxyCODONE-acetaminophen (PERCOCET/ROXICET) 5-325 MG per tablet 1 tablet (has no administration in time range)    And  oxyCODONE (Oxy IR/ROXICODONE) immediate release tablet 5 mg (has no administration in time range)  levothyroxine (SYNTHROID) tablet 274 mcg (has no administration in time range)    And  levothyroxine (SYNTHROID) tablet 137 mcg (has no administration in time range)  fentaNYL (SUBLIMAZE) injection 50 mcg (50 mcg Intravenous Given 09/01/20 1923)    ED Course  I have reviewed the triage vital signs and the nursing notes.  Pertinent labs & imaging results that were available during my care of the patient were reviewed by me  and considered in my medical decision making (see chart for details).    MDM Rules/Calculators/A&P                         patient here for evaluation of progressive right lower extremity pain. He does have fullness and pain to the knee but also has radicular symptoms throughout the entire right lower extremity. He had an arthritis and teases performed on Saturday with fluid profile consistent with inflammatory arthritis. He did have improvement in his knee pain after that procedure but he did have some persistent pain throughout the right lower extremity. On today's evaluation he is well perfused and that extremity with recurrent right knee effusion that is warm to the touch without overlying erythema. He has  pain throughout the entire extremity with inability to test strength due to severe pain. Presentation is not consistent with acute infectious process. His pain is so severe that pain medications induce hypoxia. Plan to admit to the medicine service for further workup and management for his severe pain. Discussed with Dr. Mardelle Matte, with orthopedics. The orthopedic service will follow in consult tomorrow.  No evidence of DVT or infection.   Final Clinical Impression(s) / ED Diagnoses Final diagnoses:  Back pain  Right leg pain    Rx / DC Orders ED Discharge Orders    None       Quintella Reichert, MD 09/01/20 2243

## 2020-09-02 ENCOUNTER — Telehealth: Payer: Self-pay

## 2020-09-02 DIAGNOSIS — M25561 Pain in right knee: Secondary | ICD-10-CM

## 2020-09-02 DIAGNOSIS — Z7401 Bed confinement status: Secondary | ICD-10-CM | POA: Diagnosis not present

## 2020-09-02 DIAGNOSIS — Z66 Do not resuscitate: Secondary | ICD-10-CM | POA: Diagnosis present

## 2020-09-02 DIAGNOSIS — Z7189 Other specified counseling: Secondary | ICD-10-CM

## 2020-09-02 DIAGNOSIS — M1A9XX0 Chronic gout, unspecified, without tophus (tophi): Secondary | ICD-10-CM | POA: Diagnosis present

## 2020-09-02 DIAGNOSIS — Z515 Encounter for palliative care: Secondary | ICD-10-CM

## 2020-09-02 DIAGNOSIS — R269 Unspecified abnormalities of gait and mobility: Secondary | ICD-10-CM | POA: Diagnosis not present

## 2020-09-02 DIAGNOSIS — I252 Old myocardial infarction: Secondary | ICD-10-CM | POA: Diagnosis not present

## 2020-09-02 DIAGNOSIS — G8929 Other chronic pain: Secondary | ICD-10-CM

## 2020-09-02 DIAGNOSIS — M545 Low back pain, unspecified: Secondary | ICD-10-CM

## 2020-09-02 DIAGNOSIS — M6281 Muscle weakness (generalized): Secondary | ICD-10-CM | POA: Diagnosis not present

## 2020-09-02 DIAGNOSIS — Z8546 Personal history of malignant neoplasm of prostate: Secondary | ICD-10-CM | POA: Diagnosis not present

## 2020-09-02 DIAGNOSIS — R2681 Unsteadiness on feet: Secondary | ICD-10-CM | POA: Diagnosis not present

## 2020-09-02 DIAGNOSIS — R0902 Hypoxemia: Secondary | ICD-10-CM | POA: Diagnosis not present

## 2020-09-02 DIAGNOSIS — M2550 Pain in unspecified joint: Secondary | ICD-10-CM | POA: Diagnosis not present

## 2020-09-02 DIAGNOSIS — I480 Paroxysmal atrial fibrillation: Secondary | ICD-10-CM | POA: Diagnosis present

## 2020-09-02 DIAGNOSIS — M5442 Lumbago with sciatica, left side: Secondary | ICD-10-CM | POA: Diagnosis not present

## 2020-09-02 DIAGNOSIS — Z20822 Contact with and (suspected) exposure to covid-19: Secondary | ICD-10-CM | POA: Diagnosis present

## 2020-09-02 DIAGNOSIS — M1711 Unilateral primary osteoarthritis, right knee: Secondary | ICD-10-CM | POA: Diagnosis present

## 2020-09-02 DIAGNOSIS — R52 Pain, unspecified: Secondary | ICD-10-CM | POA: Diagnosis not present

## 2020-09-02 DIAGNOSIS — Z8673 Personal history of transient ischemic attack (TIA), and cerebral infarction without residual deficits: Secondary | ICD-10-CM | POA: Diagnosis not present

## 2020-09-02 DIAGNOSIS — I110 Hypertensive heart disease with heart failure: Secondary | ICD-10-CM | POA: Diagnosis present

## 2020-09-02 DIAGNOSIS — R441 Visual hallucinations: Secondary | ICD-10-CM | POA: Diagnosis not present

## 2020-09-02 DIAGNOSIS — M25461 Effusion, right knee: Secondary | ICD-10-CM | POA: Diagnosis not present

## 2020-09-02 DIAGNOSIS — M549 Dorsalgia, unspecified: Secondary | ICD-10-CM | POA: Diagnosis present

## 2020-09-02 DIAGNOSIS — R41841 Cognitive communication deficit: Secondary | ICD-10-CM | POA: Diagnosis not present

## 2020-09-02 DIAGNOSIS — G20 Parkinson's disease: Secondary | ICD-10-CM | POA: Diagnosis present

## 2020-09-02 DIAGNOSIS — E271 Primary adrenocortical insufficiency: Secondary | ICD-10-CM | POA: Diagnosis present

## 2020-09-02 DIAGNOSIS — M79604 Pain in right leg: Secondary | ICD-10-CM | POA: Diagnosis not present

## 2020-09-02 DIAGNOSIS — M19071 Primary osteoarthritis, right ankle and foot: Secondary | ICD-10-CM | POA: Diagnosis not present

## 2020-09-02 DIAGNOSIS — M6259 Muscle wasting and atrophy, not elsewhere classified, multiple sites: Secondary | ICD-10-CM | POA: Diagnosis not present

## 2020-09-02 DIAGNOSIS — E230 Hypopituitarism: Secondary | ICD-10-CM | POA: Diagnosis present

## 2020-09-02 DIAGNOSIS — M5441 Lumbago with sciatica, right side: Secondary | ICD-10-CM | POA: Diagnosis not present

## 2020-09-02 DIAGNOSIS — I5032 Chronic diastolic (congestive) heart failure: Secondary | ICD-10-CM

## 2020-09-02 DIAGNOSIS — I2510 Atherosclerotic heart disease of native coronary artery without angina pectoris: Secondary | ICD-10-CM | POA: Diagnosis present

## 2020-09-02 DIAGNOSIS — R2689 Other abnormalities of gait and mobility: Secondary | ICD-10-CM | POA: Diagnosis not present

## 2020-09-02 DIAGNOSIS — E039 Hypothyroidism, unspecified: Secondary | ICD-10-CM

## 2020-09-02 DIAGNOSIS — K5903 Drug induced constipation: Secondary | ICD-10-CM | POA: Diagnosis not present

## 2020-09-02 DIAGNOSIS — Z96652 Presence of left artificial knee joint: Secondary | ICD-10-CM | POA: Diagnosis present

## 2020-09-02 DIAGNOSIS — G894 Chronic pain syndrome: Secondary | ICD-10-CM | POA: Diagnosis present

## 2020-09-02 DIAGNOSIS — Z8547 Personal history of malignant neoplasm of testis: Secondary | ICD-10-CM | POA: Diagnosis not present

## 2020-09-02 DIAGNOSIS — E2740 Unspecified adrenocortical insufficiency: Secondary | ICD-10-CM | POA: Diagnosis not present

## 2020-09-02 DIAGNOSIS — Z8701 Personal history of pneumonia (recurrent): Secondary | ICD-10-CM | POA: Diagnosis not present

## 2020-09-02 DIAGNOSIS — M7731 Calcaneal spur, right foot: Secondary | ICD-10-CM | POA: Diagnosis not present

## 2020-09-02 DIAGNOSIS — I10 Essential (primary) hypertension: Secondary | ICD-10-CM | POA: Diagnosis not present

## 2020-09-02 DIAGNOSIS — Z993 Dependence on wheelchair: Secondary | ICD-10-CM | POA: Diagnosis not present

## 2020-09-02 DIAGNOSIS — Z6841 Body Mass Index (BMI) 40.0 and over, adult: Secondary | ICD-10-CM | POA: Diagnosis not present

## 2020-09-02 LAB — BODY FLUID CULTURE W GRAM STAIN: Culture: NO GROWTH

## 2020-09-02 LAB — CREATININE, URINE, RANDOM: Creatinine, Urine: 166.61 mg/dL

## 2020-09-02 LAB — COMPREHENSIVE METABOLIC PANEL
ALT: 5 U/L (ref 0–44)
AST: 13 U/L — ABNORMAL LOW (ref 15–41)
Albumin: 3.4 g/dL — ABNORMAL LOW (ref 3.5–5.0)
Alkaline Phosphatase: 47 U/L (ref 38–126)
Anion gap: 8 (ref 5–15)
BUN: 21 mg/dL (ref 8–23)
CO2: 27 mmol/L (ref 22–32)
Calcium: 9.3 mg/dL (ref 8.9–10.3)
Chloride: 96 mmol/L — ABNORMAL LOW (ref 98–111)
Creatinine, Ser: 0.98 mg/dL (ref 0.61–1.24)
GFR, Estimated: >60 mL/min (ref >60)
Glucose, Bld: 115 mg/dL — ABNORMAL HIGH (ref 70–99)
Potassium: 4.1 mmol/L (ref 3.5–5.1)
Sodium: 131 mmol/L — ABNORMAL LOW (ref 135–145)
Total Bilirubin: 1.2 mg/dL (ref 0.3–1.2)
Total Protein: 6.8 g/dL (ref 6.5–8.1)

## 2020-09-02 LAB — CBC WITH DIFFERENTIAL/PLATELET
Abs Immature Granulocytes: 0.03 10*3/uL (ref 0.00–0.07)
Basophils Absolute: 0 10*3/uL (ref 0.0–0.1)
Basophils Relative: 0 %
Eosinophils Absolute: 0 10*3/uL (ref 0.0–0.5)
Eosinophils Relative: 0 %
HCT: 44.4 % (ref 39.0–52.0)
Hemoglobin: 14.1 g/dL (ref 13.0–17.0)
Immature Granulocytes: 0 %
Lymphocytes Relative: 7 %
Lymphs Abs: 0.7 10*3/uL (ref 0.7–4.0)
MCH: 26.8 pg (ref 26.0–34.0)
MCHC: 31.8 g/dL (ref 30.0–36.0)
MCV: 84.3 fL (ref 80.0–100.0)
Monocytes Absolute: 0.4 10*3/uL (ref 0.1–1.0)
Monocytes Relative: 5 %
Neutro Abs: 8.6 10*3/uL — ABNORMAL HIGH (ref 1.7–7.7)
Neutrophils Relative %: 88 %
Platelets: 186 10*3/uL (ref 150–400)
RBC: 5.27 MIL/uL (ref 4.22–5.81)
RDW: 13.6 % (ref 11.5–15.5)
WBC: 9.8 10*3/uL (ref 4.0–10.5)
nRBC: 0 % (ref 0.0–0.2)

## 2020-09-02 LAB — OSMOLALITY, URINE: Osmolality, Ur: 823 mOsm/kg (ref 300–900)

## 2020-09-02 LAB — MAGNESIUM: Magnesium: 2.1 mg/dL (ref 1.7–2.4)

## 2020-09-02 LAB — SODIUM, URINE, RANDOM: Sodium, Ur: 50 mmol/L

## 2020-09-02 LAB — TSH: TSH: 1.778 u[IU]/mL (ref 0.350–4.500)

## 2020-09-02 LAB — LACTIC ACID, PLASMA: Lactic Acid, Venous: 0.9 mmol/L (ref 0.5–1.9)

## 2020-09-02 LAB — PHOSPHORUS: Phosphorus: 3 mg/dL (ref 2.5–4.6)

## 2020-09-02 LAB — OSMOLALITY: Osmolality: 290 mOsm/kg (ref 275–295)

## 2020-09-02 MED ORDER — FENTANYL CITRATE (PF) 100 MCG/2ML IJ SOLN
50.0000 ug | INTRAMUSCULAR | Status: DC | PRN
  Administered 2020-09-02: 50 ug via INTRAVENOUS
  Filled 2020-09-02: qty 2

## 2020-09-02 MED ORDER — OXYCODONE HCL 5 MG PO TABS
15.0000 mg | ORAL_TABLET | ORAL | Status: DC | PRN
  Administered 2020-09-02: 15 mg via ORAL
  Filled 2020-09-02: qty 3

## 2020-09-02 MED ORDER — OXYCODONE HCL ER 10 MG PO T12A
40.0000 mg | EXTENDED_RELEASE_TABLET | Freq: Two times a day (BID) | ORAL | Status: DC
  Administered 2020-09-02 – 2020-09-03 (×2): 40 mg via ORAL
  Filled 2020-09-02 (×2): qty 2

## 2020-09-02 MED ORDER — METHYLPREDNISOLONE ACETATE 40 MG/ML IJ SUSP
40.0000 mg | Freq: Once | INTRAMUSCULAR | Status: DC
  Filled 2020-09-02: qty 1

## 2020-09-02 MED ORDER — OXYCODONE-ACETAMINOPHEN 5-325 MG PO TABS
1.0000 | ORAL_TABLET | ORAL | Status: DC | PRN
  Administered 2020-09-02: 1 via ORAL
  Filled 2020-09-02: qty 1

## 2020-09-02 MED ORDER — OXYCODONE-ACETAMINOPHEN 5-325 MG PO TABS
1.0000 | ORAL_TABLET | ORAL | Status: DC | PRN
  Administered 2020-09-02 – 2020-09-05 (×5): 1 via ORAL
  Filled 2020-09-02 (×5): qty 1

## 2020-09-02 MED ORDER — MAGNESIUM CITRATE PO SOLN
1.0000 | Freq: Once | ORAL | Status: AC
  Administered 2020-09-02: 1 via ORAL
  Filled 2020-09-02: qty 296

## 2020-09-02 MED ORDER — OXYCODONE HCL 5 MG PO TABS: 20.0000 mg | ORAL_TABLET | ORAL | Status: DC | PRN

## 2020-09-02 MED ORDER — LIDOCAINE 5 % EX PTCH
2.0000 | MEDICATED_PATCH | CUTANEOUS | Status: DC
  Administered 2020-09-02 – 2020-09-09 (×9): 2 via TRANSDERMAL
  Filled 2020-09-02 (×10): qty 2

## 2020-09-02 MED ORDER — BUPIVACAINE HCL (PF) 0.5 % IJ SOLN
10.0000 mL | Freq: Once | INTRAMUSCULAR | Status: DC
  Filled 2020-09-02: qty 10

## 2020-09-02 MED ORDER — OXYCODONE HCL 5 MG PO TABS
5.0000 mg | ORAL_TABLET | ORAL | Status: DC | PRN
  Administered 2020-09-02: 5 mg via ORAL
  Filled 2020-09-02 (×2): qty 1

## 2020-09-02 NOTE — Progress Notes (Signed)
OT Cancellation Note  Patient Details Name: Dennis [name]. MRN: [id number] DOB: December 20, 1945   Cancelled Treatment:    Reason Eval/Treat Not Completed: Patient at procedure or test/ unavailable (Pt with palliative care and to have a his knee aspirated this afternoon. Will follow.)  Malka So 09/02/2020, 12:09 PM  Nestor Lewandowsky, OTR/L Acute Rehabilitation Services Pager: [phone] Office: [phone]

## 2020-09-02 NOTE — Progress Notes (Signed)
PT Cancellation Note  Patient Details Name: Dennis Keelan Laguna Sr. MRN: [id number] DOB: March 23, 1946   Cancelled Treatment:    Reason Eval/Treat Not Completed: Patient at procedure or test/unavailable Patient with palliative care and to have a his knee aspirated this afternoon. Will follow.  Shann Merrick A. Gilford Rile PT, DPT Acute Rehabilitation Services Pager [phone] Office [phone]    Linna Hoff 09/02/2020, 12:10 PM

## 2020-09-02 NOTE — NC FL2 (Signed)
[place] LEVEL OF CARE SCREENING TOOL     IDENTIFICATION  Patient Name: Dennis [name]. Birthdate: 1946/01/16 Sex: male Admission Date (Current Location): 09/01/2020  [facility] and [place] Number:  Herbalist and Address:  [facility], [place] [address], [place], [place] [address]      Provider Number: [id number]  Attending Physician Name and Address:  Donne Hazel, MD  Relative Name and Phone Number:       Current Level of Care: Hospital Recommended Level of Care: [place] Prior Approval Number:    Date Approved/Denied:   PASRR Number: [id number]  Discharge Plan: SNF    Current Diagnoses: Patient Active Problem List   Diagnosis Date Noted  . Right knee pain 09/01/2020  . Abnormal weight loss 08/05/2020  . Dysuria 08/05/2020  . Postlaminectomy syndrome, not elsewhere classified 04/19/2019  . Hypogonadism in male 04/13/2019  . Chronic bilateral low back pain with bilateral sciatica 04/13/2019  . Pain in finger of left hand 03/04/2019  . Chronic anticoagulation 11/18/2018  . Gouty arthritis 09/28/2017  . Dysautonomia orthostatic hypotension syndrome 08/04/2017  . 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 Paroxysmal atrial fibrillation ([place]) 06/30/2017  . Atypical chest pain 06/26/2017  . Primary parkinsonism ([place]) 05/19/2017  . History of prostate cancer 02/10/2017  . S/P prostatectomy 02/10/2017  . Spontaneous bruising 02/08/2017  . Acute on chronic kidney failure ([place]) 12/11/2016  . History of stroke   . Localized swelling of lower extremity 11/15/2016  . Addison disease ([place]) 11/15/2016  . Hypogonadotropic hypogonadism ([place]) 06/09/2016  . Adrenal insufficiency ([place]) 03/18/2016  . Vertigo 03/17/2016  . Essential hypertension   . Hypotension due to drugs   . Acquired hypothyroidism 11/04/2015  . Arthritis 11/04/2015  . Narrowing of intervertebral disc space 11/04/2015   . Major depressive disorder, single episode, mild ([place]) 11/04/2015  . H/o Lyme disease 11/04/2015  . Hypercholesterolemia 11/04/2015  . Morbid obesity ([place]) 11/04/2015  . Temporary cerebral vascular dysfunction 11/04/2015  . Chronic tophaceous gout 09/21/2015  . Sleep apnea 08/03/2014  . Chronic diastolic CHF (congestive heart failure) ([place]) 08/03/2014  . CAD (coronary artery disease) 08/02/2014  . S/P lumbar spinal fusion 07/17/2013  . Chronic back pain     Orientation RESPIRATION BLADDER Height & Weight     Self,Time,Situation,Place  Normal Continent Weight: (!) 352 lb 11.8 oz (160 kg) Height:  6\' 5"  (195.6 cm)  BEHAVIORAL SYMPTOMS/MOOD NEUROLOGICAL BOWEL NUTRITION STATUS      Continent Diet (See discharge summary)  AMBULATORY STATUS COMMUNICATION OF NEEDS Skin   Extensive Assist Verbally Normal                       Personal Care Assistance Level of Assistance  Bathing,Feeding,Dressing Bathing Assistance: Maximum assistance Feeding assistance: Limited assistance Dressing Assistance: Maximum assistance     Functional Limitations Info  Speech,Hearing,Sight Sight Info: Adequate Hearing Info: Adequate Speech Info: Adequate    SPECIAL CARE FACTORS FREQUENCY  PT (By licensed PT),OT (By licensed OT)     PT Frequency: 5x a week OT Frequency: 5x a week            Contractures Contractures Info: Not present    Additional Factors Info  Code Status,Allergies Code Status Info: DNR Allergies Info: Bee Venom   Shrimp (Shellfish Allergy)   Stadol (Butorphanol)   Wasp Venom   Other  Current Medications (09/02/2020):  This is the current hospital active medication list Current Facility-Administered Medications  Medication Dose Route Frequency Provider Last Rate Last Admin  . 0.9 %  sodium chloride infusion  250 mL Intravenous PRN Doutova, Nyoka Lint, MD      . acetaminophen (TYLENOL) tablet 650 mg  650 mg Oral Q6H PRN Doutova, Anastassia, MD       Or   . acetaminophen (TYLENOL) suppository 650 mg  650 mg Rectal Q6H PRN Doutova, Anastassia, MD      . allopurinol (ZYLOPRIM) tablet 450 mg  450 mg Oral Daily Doutova, Anastassia, MD   450 mg at 09/02/20 0931  . bisacodyl (DULCOLAX) suppository 10 mg  10 mg Rectal Daily PRN Doutova, Anastassia, MD      . bupivacaine (MARCAINE) 0.5 % injection 10 mL  10 mL Infiltration Once Owens Shark, Blaine K, PA-C      . carbidopa-levodopa (SINEMET IR) 25-250 MG per tablet immediate release 2 tablet  2 tablet Oral QID Toy Baker, MD   2 tablet at 09/02/20 1523  . colchicine tablet 0.6 mg  0.6 mg Oral Daily PRN Toy Baker, MD      . cyclobenzaprine (FLEXERIL) tablet 5 mg  5 mg Oral TID PRN Toy Baker, MD   5 mg at 09/02/20 0954  . DULoxetine (CYMBALTA) DR capsule 60 mg  60 mg Oral Daily Doutova, Anastassia, MD   60 mg at 09/02/20 0932  . fentaNYL (SUBLIMAZE) injection 50 mcg  50 mcg Intravenous Q2H PRN Toy Baker, MD   50 mcg at 09/02/20 0525  . furosemide (LASIX) tablet 40 mg  40 mg Oral Daily Doutova, Anastassia, MD   40 mg at 09/02/20 0932  . hydrocortisone (CORTEF) tablet 10 mg  10 mg Oral Q supper Toy Baker, MD   10 mg at 09/01/20 2228  . hydrocortisone (CORTEF) tablet 20 mg  20 mg Oral Daily Doutova, Anastassia, MD   20 mg at 09/02/20 0932  . levothyroxine (SYNTHROID) tablet 274 mcg  274 mcg Oral Q0600 Toy Baker, MD   274 mcg at 09/02/20 0527   And  . [START ON 09/03/2020] levothyroxine (SYNTHROID) tablet 137 mcg  137 mcg Oral Once per day on Thu Doutova, Anastassia, MD      . lidocaine (LIDODERM) 5 % 2 patch  2 patch Transdermal Q75F Pershing Proud, NP      . methylPREDNISolone acetate (DEPO-MEDROL) injection 40 mg  40 mg Intra-articular Once Brown, Blaine K, PA-C      . ondansetron (ZOFRAN) tablet 4 mg  4 mg Oral Q6H PRN Toy Baker, MD       Or  . ondansetron (ZOFRAN) injection 4 mg  4 mg Intravenous Q6H PRN Doutova, Anastassia, MD      .  oxyCODONE-acetaminophen (PERCOCET/ROXICET) 5-325 MG per tablet 1 tablet  1 tablet Oral F6B PRN Pershing Proud, NP       And  . oxyCODONE (Oxy IR/ROXICODONE) immediate release tablet 5 mg  5 mg Oral W4Y PRN Pershing Proud, NP      . oxyCODONE (OXYCONTIN) 12 hr tablet 40 mg  40 mg Oral K59D Vinie Sill C, NP      . polyethylene glycol (MIRALAX / GLYCOLAX) packet 17 g  17 g Oral Daily PRN Doutova, Anastassia, MD      . rosuvastatin (CRESTOR) tablet 20 mg  20 mg Oral Daily Doutova, Anastassia, MD   20 mg at 09/02/20 0954  . senna (SENOKOT) tablet 8.6 mg  1  tablet Oral BID Toy Baker, MD   8.6 mg at 09/02/20 0932  . sodium chloride flush (NS) 0.9 % injection 3 mL  3 mL Intravenous Q12H Doutova, Anastassia, MD   3 mL at 09/02/20 0956  . sodium chloride flush (NS) 0.9 % injection 3 mL  3 mL Intravenous PRN Doutova, Anastassia, MD      . topiramate (TOPAMAX) tablet 50 mg  50 mg Oral BID Toy Baker, MD   50 mg at 09/02/20 0932     Discharge Medications: Please see discharge summary for a list of discharge medications.  Relevant Imaging Results:  Relevant Lab Results:   Additional Information SS# [place], [place]

## 2020-09-02 NOTE — Progress Notes (Signed)
PROGRESS NOTE    Dennis Shams Sr.  JIR:[id number] DOB: 12/18/1945 DOA: 09/01/2020 PCP: [name], MD    Brief Narrative:  75 y.o. male with medical history significant of chronic diastolic CHF, HTN, hypogonadotrophic hypogonadism, Parkinson disease, paroxysmal atrial fibrillation on anticoagulation, hypothyroidism, gout, chronic right knee pain, chronic pain syndrome Pt presented with   worsening right knee pain.  Initially was seen on 19 February presented to emergency department with severe right knee pain unable to ambulate at all. Limited range of motion passively and actively. Knee tap was done at the time showed inflammatory changes Cultures still no growth.  Assessment & Plan:   Active Problems:   CAD (coronary artery disease)   Chronic diastolic CHF (congestive heart failure) (HCC)   Acquired hypothyroidism   Hypercholesterolemia   Morbid obesity (HCC)   Essential hypertension   Adrenal insufficiency (HCC)   Primary parkinsonism (HCC)   Paroxysmal atrial fibrillation ([place])   Hypopituitarism due to empty sella syndrome ([place])   Right knee pain   Present on Admission: . Right knee pain  -Non-infectious and no crystals seen on fluid analysis -Orthopedic following. Pt has known hx of osteoarthritic changes -Plan today for re-tap with injection of knee -PT/OT consulted with recs for SNF -This AM when seen with Palliative Care at bedside, pt was completely immobile and not tolerating even passive movement of RLE, secondary to marked and uncontrolled pain -TOC to follow regarding possible SNF placement  . Primary parkinsonism ([place])  -chronic stable continue home medication -Therapy recs for SNF noted   . Paroxysmal atrial fibrillation (HCC)  -Anticoagulation was placed on hold anticoagulation at time of presentation in anticipation of knee aspiration -Would resume when OK with orthopedic surgery  . Morbid obesity ([place])  -Recommend diet/lifestyle  modification  . Hypopituitarism due to empty sella syndrome ([place]) resume home medications  . Hypercholesterolemia -chronic stable continue crest  . Essential hypertension  -BP currently stable and controlled at this time  . Chronic diastolic CHF (congestive heart failure) (HCC) continue Lasix and -Currently appears euvolemic  . CAD (coronary artery disease) -chronic continue statin currently no chest pain Does not tolerate beta-blocker  . Adrenal insufficiency ([place]) continue Cortef  . Acquired hypothyroidism continue home medication check TSH  History of chronic pain continue home medications as tolerated and cont to titrate to pt comfort -Appreciate Palliative Care input regarding analgesia recs  Transient hypoxia -currently on room air no monitor for any evidence of hypoxia with pain medication administration.   DVT prophylaxis: SCD's Code Status: DNR Family Communication: Pt in room, family at bedside  Status is: Observation  The patient will require care spanning > 2 midnights and should be moved to inpatient because: Ongoing active pain requiring inpatient pain management and Unsafe d/c plan  Dispo: The patient is from: Home              Anticipated d/c is to: SNF              Anticipated d/c date is: 2 days              Patient currently is not medically stable to d/c.   Difficult to place patient No       Consultants:   Orthopedic Surgery  Procedures:     Antimicrobials: Anti-infectives (From admission, onward)   None       Subjective: Complaining of marked RLE/knee pain even with gentle passive leg movement  Objective: Vitals:   09/01/20 2343 09/02/20  0250 09/02/20 0532 09/02/20 1452  BP: 127/79  126/80 101/84  Pulse: 78  73 91  Resp: 18  18 18   Temp: 97.8 F (36.6 C)  97.6 F (36.4 C) 97.6 F (36.4 C)  TempSrc: Oral  Oral Oral  SpO2: 100%  98% 97%  Weight:  (!) 160 kg    Height:  6\' 5"  (1.956 m)      Intake/Output Summary  (Last 24 hours) at 09/02/2020 1632 Last data filed at 09/02/2020 1400 Gross per 24 hour  Intake 440 ml  Output 1150 ml  Net -710 ml   Filed Weights   09/02/20 0250  Weight: (!) 160 kg    Examination:  General exam: Appears in acute pain and uncomfortable Respiratory system: Clear to auscultation. Respiratory effort normal. Cardiovascular system: S1 & S2 heard, Regular Gastrointestinal system: Abdomen is nondistended, soft and nontender. No organomegaly or masses felt. Normal bowel sounds heard. Central nervous system: Alert and oriented. No focal neurological deficits. Extremities: Symmetric 5 x 5 power. Skin: No rashes, lesions Psychiatry: Judgement and insight appear normal. Mood & affect appropriate.   Data Reviewed: I have personally reviewed following labs and imaging studies  CBC: Recent Labs  Lab 08/29/20 1440 09/01/20 1352 09/02/20 0137  WBC 6.9 9.8 9.8  NEUTROABS 5.6  --  8.6*  HGB 16.0 15.6 14.1  HCT 49.0 48.0 44.4  MCV 83.6 85.0 84.3  PLT 132* 203 269   Basic Metabolic Panel: Recent Labs  Lab 08/29/20 1440 09/01/20 1352 09/02/20 0137  NA 133* 132* 131*  K 4.1 4.2 4.1  CL 94* 96* 96*  CO2 28 25 27   GLUCOSE 120* 142* 115*  BUN 19 23 21   CREATININE 1.12 1.17 0.98  CALCIUM 9.7 9.5 9.3  MG  --   --  2.1  PHOS  --   --  3.0   GFR: Estimated Creatinine Clearance: 109.9 mL/min (by C-G formula based on SCr of 0.98 mg/dL). Liver Function Tests: Recent Labs  Lab 09/02/20 0137  AST 13*  ALT 5  ALKPHOS 47  BILITOT 1.2  PROT 6.8  ALBUMIN 3.4*   No results for input(s): LIPASE, AMYLASE in the last 168 hours. No results for input(s): AMMONIA in the last 168 hours. Coagulation Profile: No results for input(s): INR, PROTIME in the last 168 hours. Cardiac Enzymes: Recent Labs  Lab 09/01/20 2047  CKTOTAL 22*   BNP (last 3 results) No results for input(s): PROBNP in the last 8760 hours. HbA1C: No results for input(s): HGBA1C in the last 72  hours. CBG: No results for input(s): GLUCAP in the last 168 hours. Lipid Profile: No results for input(s): CHOL, HDL, LDLCALC, TRIG, CHOLHDL, LDLDIRECT in the last 72 hours. Thyroid Function Tests: Recent Labs    09/02/20 0137  TSH 1.778   Anemia Panel: No results for input(s): VITAMINB12, FOLATE, FERRITIN, TIBC, IRON, RETICCTPCT in the last 72 hours. Sepsis Labs: Recent Labs  Lab 09/01/20 2120 09/02/20 0137  LATICACIDVEN 0.9 0.9    Recent Results (from the past 240 hour(s))  Gram stain     Status: None   Collection Time: 08/29/20  7:32 PM   Specimen: KNEE; Synovial Fluid  Result Value Ref Range Status   Specimen Description KNEE  Final   Special Requests NONE  Final   Gram Stain   Final    WBC SEEN FEW NO ORGANISMS SEEN Performed at [facility] [address], [place], [place] [address]    Report Status 08/29/2020  FINAL  Final  Body fluid culture w Gram Stain     Status: None   Collection Time: 08/29/20  7:32 PM   Specimen: KNEE; Synovial Fluid  Result Value Ref Range Status   Specimen Description   Final    KNEE Performed at [facility] [address], [place], [place] [address]    Special Requests   Final    NONE Performed at [facility], [place] [address], [place], [place] [address]    Gram Stain   Final    FEW WBC PRESENT, PREDOMINANTLY PMN NO ORGANISMS SEEN    Culture   Final    NO GROWTH 3 DAYS Performed at [facility] [address], [place], [place] [address]    Report Status 09/02/2020 FINAL  Final  SARS CORONAVIRUS 2 (TAT 6-24 HRS) Nasopharyngeal Nasopharyngeal Swab     Status: None   Collection Time: 09/01/20  5:18 PM   Specimen: Nasopharyngeal Swab  Result Value Ref Range Status   SARS Coronavirus 2 NEGATIVE NEGATIVE Final    Comment: (NOTE) SARS-CoV-2 target nucleic acids are NOT DETECTED.  The SARS-CoV-2 RNA is generally detectable in upper and lower respiratory specimens  during the acute phase of infection. Negative results do not preclude SARS-CoV-2 infection, do not rule out co-infections with other pathogens, and should not be used as the sole basis for treatment or other patient management decisions. Negative results must be combined with clinical observations, patient history, and epidemiological information. The expected result is Negative.  Fact Sheet for Patients: SugarRoll.be  Fact Sheet for Healthcare Providers: https://www.woods-mathews.com/  This test is not yet approved or cleared by the [place] FDA and  has been authorized for detection and/or diagnosis of SARS-CoV-2 by FDA under an Emergency Use Authorization (EUA). This EUA will remain  in effect (meaning this test can be used) for the duration of the COVID-19 declaration under Se ction 564(b)(1) of the Act, 21 U.S.C. section 360bbb-3(b)(1), unless the authorization is terminated or revoked sooner.  Performed at [facility], [place] [address] [place], [place] [address]      Radiology Studies: DG Chest 2 View  Result Date: 09/01/2020 CLINICAL DATA:  Hypoxemia. EXAM: CHEST - 2 VIEW COMPARISON:  02/21/2019 FINDINGS: Hypoventilation. Decreased lung volume with mild atelectasis in the bases. Negative for heart failure or pneumonia. No pleural effusion. Spinal cord stimulator midthoracic spine has been placed in the interval. Cardiac loop recorder has been removed. IMPRESSION: Hypoventilation with bibasilar atelectasis. Electronically Signed   By: Franchot Gallo M.D.   On: 09/01/2020 15:02   CT Head Wo Contrast  Result Date: 09/01/2020 CLINICAL DATA:  Previous history of stroke right hip pain EXAM: CT HEAD WITHOUT CONTRAST TECHNIQUE: Contiguous axial images were obtained from the base of the skull through the vertex without intravenous contrast. COMPARISON:  MRI 06/27/2017, 08/30/2017, CT brain 06/27/2017 FINDINGS: Brain: No acute  territorial infarction, hemorrhage, or intracranial mass is visualized. Chronic lacunar infarcts within the left basal ganglia and white matter. Mild to moderate chronic small vessel ischemic change of the white matter. Moderate atrophy. Stable ventricle size. Vascular: No hyperdense vessels. Vertebral and carotid vascular calcification Skull: Normal. Negative for fracture or focal lesion. Sinuses/Orbits: Mucosal thickening in the sinuses Other: None IMPRESSION: 1. No CT evidence for acute intracranial abnormality. 2. Atrophy and chronic small vessel ischemic changes of the white matter. Chronic lacunar infarcts in the left basal ganglia and white matter. Electronically Signed   By:  Donavan Foil M.D.   On: 09/01/2020 18:47   CT L-SPINE NO CHARGE  Result Date: 09/01/2020 CLINICAL DATA:  Sciatica and chronic pain to right hip and right knee, progressive. EXAM: CT LUMBAR SPINE WITHOUT CONTRAST TECHNIQUE: Multidetector CT imaging of the lumbar spine was performed without intravenous contrast administration. Multiplanar CT image reconstructions were also generated. COMPARISON:  MRI lumbar spine 02/22/2019. FINDINGS: Segmentation: Inferior-most fully formed intervertebral disc is labeled L5-S1. Alignment: No substantial subluxation. Vertebrae: L2-L5 PLIF. Evidence of osseous fusion across L2-L3, L3-L4, and L4-L5. Vertebral body heights are maintained. No evidence of acute fracture. Paraspinal and other soft tissues: Calcific atherosclerosis of the aorta. Partially imaged, spinal cord stimulator which enters the canal at T11-T12 dorsally. Disc levels: At L2-L3 there is focal right subarticular bony spurring, which may affect the descending right L3 nerve root and was demonstrated on prior MRI. Multilevel facet hypertrophy. Limited evaluation of the canal given absence of contrast and streak artifact from fusion IMPRESSION: 1. No evidence of acute fracture or traumatic malalignment. 2. At L2-L3 there is focal right  subarticular bony spurring, which may affect the descending right L3 nerve root and was demonstrated on prior MRI. 3. Limited evaluation of the canal due to streak artifact from hardware and absence of contrast. An MRI or CT myelogram could further characterize the canal/cord/foramina if clinically indicated. Electronically Signed   By: Margaretha Sheffield MD   On: 09/01/2020 18:45   CT Renal Stone Study  Result Date: 09/01/2020 CLINICAL DATA:  Right flank pain EXAM: CT ABDOMEN AND PELVIS WITHOUT CONTRAST TECHNIQUE: Multidetector CT imaging of the abdomen and pelvis was performed following the standard protocol without IV contrast. COMPARISON:  CT 02/08/2018 FINDINGS: Lower chest: Lung bases demonstrate no acute consolidation or effusion. Bilateral gynecomastia. Coronary vascular calcification. Hepatobiliary: No focal liver abnormality is seen. No gallstones, gallbladder wall thickening, or biliary dilatation. Pancreas: Unremarkable. No pancreatic ductal dilatation or surrounding inflammatory changes. Spleen: Normal in size without focal abnormality. Adrenals/Urinary Tract: Adrenal glands are unremarkable. Kidneys show no hydronephrosis. Negative for ureteral stone. Bladder is unremarkable Stomach/Bowel: Stomach is within normal limits. Appendix appears normal. No evidence of bowel wall thickening, distention, or inflammatory changes. Vascular/Lymphatic: Mild aortic atherosclerosis. No aneurysm. No suspicious nodes Reproductive: Negative for mass. Other: Negative for free air or free fluid. Right greater than left fat containing inguinal hernias. Musculoskeletal: Orthopedic hardware L2 through L5, see separately dictated lumbar spine CT report. Ascending thoracic stimulator leads terminating at T7-T8. IMPRESSION: 1. Negative for hydronephrosis or ureteral stone. 2. No CT evidence for acute intra-abdominal or pelvic abnormality. Aortic Atherosclerosis (ICD10-I70.0). Electronically Signed   By: Donavan Foil M.D.    On: 09/01/2020 19:07   VAS [name] LOWER EXTREMITY VENOUS (DVT) (ONLY [facility] & [facility])  Result Date: 09/02/2020  Lower Venous DVT Study Indications: RLE pain.  Risk Factors: HX of arthritis and sciatica. Comparison Study: Last exam was on 07/16/18 - negative Performing Technologist: Rogelia Rohrer  Examination Guidelines: A complete evaluation includes B-mode imaging, spectral Doppler, color Doppler, and power Doppler as needed of all accessible portions of each vessel. Bilateral testing is considered an integral part of a complete examination. Limited examinations for reoccurring indications may be performed as noted. The reflux portion of the exam is performed with the patient in reverse Trendelenburg.  +---------+---------------+---------+-----------+----------+--------------+ RIGHT    CompressibilityPhasicitySpontaneityPropertiesThrombus Aging +---------+---------------+---------+-----------+----------+--------------+ CFV      Full           Yes      Yes                                 +---------+---------------+---------+-----------+----------+--------------+  SFJ      Full                                                        +---------+---------------+---------+-----------+----------+--------------+ FV Prox  Full           Yes      Yes                                 +---------+---------------+---------+-----------+----------+--------------+ FV Mid   Full           Yes      Yes                                 +---------+---------------+---------+-----------+----------+--------------+ FV DistalFull           Yes      Yes                                 +---------+---------------+---------+-----------+----------+--------------+ PFV      Full                                                        +---------+---------------+---------+-----------+----------+--------------+ POP      Full           Yes      Yes                                  +---------+---------------+---------+-----------+----------+--------------+ PTV      Full                                                        +---------+---------------+---------+-----------+----------+--------------+ PERO     Full                                                        +---------+---------------+---------+-----------+----------+--------------+   +----+---------------+---------+-----------+----------+--------------+ LEFTCompressibilityPhasicitySpontaneityPropertiesThrombus Aging +----+---------------+---------+-----------+----------+--------------+ CFV Full           Yes      Yes                                 +----+---------------+---------+-----------+----------+--------------+     Summary: RIGHT: - There is no evidence of deep vein thrombosis in the lower extremity. - There is no evidence of superficial venous thrombosis.  - No cystic structure found in the popliteal fossa.  LEFT: - No evidence of common femoral vein obstruction.  *See table(s) above for measurements and observations. Electronically signed by Deitra Mayo MD on 09/02/2020 at 5:58:03 AM.    Final  Scheduled Meds: . allopurinol  450 mg Oral Daily  . bupivacaine  10 mL Infiltration Once  . carbidopa-levodopa  2 tablet Oral QID  . DULoxetine  60 mg Oral Daily  . furosemide  40 mg Oral Daily  . hydrocortisone  10 mg Oral Q supper  . hydrocortisone  20 mg Oral Daily  . levothyroxine  274 mcg Oral Q0600   And  . [START ON 09/03/2020] levothyroxine  137 mcg Oral Once per day on Thu  . lidocaine  2 patch Transdermal Q24H  . methylPREDNISolone acetate  40 mg Intra-articular Once  . oxyCODONE  40 mg Oral Q12H  . rosuvastatin  20 mg Oral Daily  . senna  1 tablet Oral BID  . sodium chloride flush  3 mL Intravenous Q12H  . topiramate  50 mg Oral BID   Continuous Infusions: . sodium chloride       LOS: 0 days   Marylu Lund, MD [facility] Pager On Amion  If 7PM-7AM,  please contact night-coverage 09/02/2020, 4:32 PM

## 2020-09-02 NOTE — Consult Note (Signed)
Consultation Note Date: 09/02/2020   Patient Name: Dennis [name].  DOB: 1945-11-14  MRN: [id number]  Age / Sex: 75 y.o., male  PCP: [name], MD Referring Physician: Donne Hazel, MD  Reason for Consultation: Establishing goals of care  HPI/Patient Profile: 75 y.o. male  with past medical history of diastolic heart failure, hypertension, paroxysmal atrial fibrillation on anticoagulation, OSA on CPAP, hypogonadotrophic hypogonadism, Parkinson's disease (wheelchair bound), Addison's disease, h/o testicular and prostate cancer, gout, chronic right knee pain, chronic pain syndrome admitted on 09/01/2020 with worsening right knee pain. Followed by outpatient palliative care with [facility] last visit documented 08/20/20 where he is documented to be DNR and nearing hospice eligibility and that patient and family open to hospice support at home. Orthopedics to see for knee pain.   Clinical Assessment and Goals of Care: I met today at Dennis [name]'s bedside along with wife. Joined by CSW and then by Dr. Wyline Copas. I spent much time discussing with them his pain medication regimen. He is having increasing pain and they report that he does get some relief but seems pain has overall been increasing over time. Seems to get relief from pain medication but this does make him lethargic sometimes and often when awake he is still having pain. His pain has been limiting his functional status although he has had gradually declining lower body weakness due to Parkinson's disease but his upper body remains strong. They have lift chair, ramp, and all equipment at home. However, they have been struggling to care for him at home and feel he will need placement. Hopeful that he can have some improvement with rehab and be able to assist with transfers again but they also know that he may be nearing the need for long term care as well. Hospice  has evaluated recently and this is not an option at this stage but may be nearing eligibility with declining functional status, beginning to have some more confusion, decreasing intake with weight loss.   I spoke with them further regarding goals of care. I asked them to consider and discuss if they can care for him at home even with the assistance of hospice care if his functional status is declining. This does not need to be answered today. They both confirm desire for DNR status. They have completed MOST form and plan to bring in so we can review for any changes and place on file. Open to hospice care when eligible.   All questions/concerns addressed. Emotional support provided.   Primary Decision Maker PATIENT    SUMMARY OF RECOMMENDATIONS   - DNR  - SNF rehab needed - Continue outpatient palliative care   Code Status/Advance Care Planning:  DNR   Symptom Management:   Chronic pain from low back and right knee (difficult to differentiate symptoms from back vs knee):  Spinal stimulator.   OxyCONTIN 40 mg every 12 hours (equivolent dosage to home dose Xtampza ER 36 mg every 12 hours). Discussed with outpatient palliative provider Dennis Ben, NP and  may need to consider transition to fentanyl patch as may be time for opioid rotation. Will consider fentanyl patch 50 mcg/hr tomorrow - will discuss with patient/family and medical team.   Oxycodone-acetaminophen 10-325 mg every 4 hours. Patient reports taking 2-3 of these at a time when pain gets bad. Amy reports that he  often has pills left over at end of the month.   Continue lidoderm patches to low back and right knee.   Continue flexeril TID as needed. Consider scheduled dosing.  Continue Cymbalta.   Bowel Regimen:   Continue senokot 1 tab twice daily.  LBM 08/29/20. Mag citrate to be given today as they report this is usually effective for him. May consider increasing senokot dosing.   Palliative Prophylaxis:   Bowel  Regimen, Delirium Protocol, Frequent Pain Assessment and Oral Care   Psycho-social/Spiritual:   Desire for further Chaplaincy support:yes  Additional Recommendations: Caregiver support/resources.   Prognosis:   Overall prognosis poor. Likely approaching hospice eligibility.   Discharge Planning: [place] for rehab with Palliative care service follow-up      Primary Diagnoses: Present on Admission: . Right knee pain . Primary parkinsonism ([place]) . Paroxysmal atrial fibrillation ([facility]) . Morbid obesity ([place]) . Hypopituitarism due to empty sella syndrome ([place]) . Hypercholesterolemia . Essential hypertension . Chronic diastolic CHF (congestive heart failure) ([place]) . CAD (coronary artery disease) . Adrenal insufficiency ([place]) . Acquired hypothyroidism   I have reviewed the medical record, interviewed the patient and family, and examined the patient. The following aspects are pertinent.  Past Medical History:  Diagnosis Date  . Addison's disease ([place]) 01/2016  . Arthritis    low back - DDD  . Cataract 2019   corrected with surgery  . Cellulitis, scrotum 08/02/2014  . Chronic diastolic CHF (congestive heart failure) ([place])    Echo 12/18: severe LVH, EF 60-65, Gr 1 DD // Echo 5/18: EF 65-70, Gr 1 DD  . Chronic lower back pain    "from [place] 2007"  . Complication of anesthesia    Sometimes has N&V /w anesth.   . Coronary artery disease    NSTEMI >> LHC 9/01: prox and mid LAD 50-70; mRCA 40 >> med Rx // Nuc 8/13 [facility]):  no infarct or ischemia, EF 59  . Elevated PSA   . Epididymitis, left 08/04/2014  . History of chronic bronchitis   . History of gout   . History of stroke 01/22/2016   2004 - "right brain stem; no residual " // 2017  . Hypertension   . Hypocholesteremia   . Hypothyroidism   . Ileitis 11/18/2018  . Infection of skin due to methicillin resistant Staphylococcus aureus (MRSA) 12/28/2017  . Kidney stone   . Myocardial infarction [facility]) 2001    2001- cardiac cath., cardiac clearanece note dr Otho Perl 05-14-13 on chart, stress test results 02-21-12 on chart  . OSA on CPAP    cpap setting of 10  . Parkinson's disease ([place])    stage 4   . Pneumonia 2000's and 2013  . PONV (postoperative nausea and vomiting)   . [facility] spotted fever   . Stroke (cerebrum) ([place])   . Testicular cancer ([place]) 2015   Social History   Socioeconomic History  . Marital status: Married    Spouse name: [name]  . Number of children: 1  . Years of education: Not on file  . Highest education level: Not on file  Occupational History  . Occupation: Retired  Tobacco Use  .  Smoking status: Never Smoker  . Smokeless tobacco: Never Used  Vaping Use  . Vaping Use: Never used  Substance and Sexual Activity  . Alcohol use: Not Currently  . Drug use: No  . Sexual activity: Not Currently  Other Topics Concern  . 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 Difficulty of Paying Living Expenses: Not hard at all  Food Insecurity: No Food Insecurity  . Worried About Charity fundraiser in the Last Year: Never true  . Ran Out of Food in the Last Year: Never true  Transportation Needs: No Transportation Needs  . Lack of Transportation (Medical): No  . Lack of Transportation (Non-Medical): No  Physical Activity: Inactive  . Days of Exercise per Week: 0 days  . Minutes of Exercise per Session: 0 min  Stress: No Stress Concern Present  . Feeling of Stress : Not at all   Social Connections: Not on file   Family History  Problem Relation Age of Onset  . Cervical cancer Mother   . Diabetes type II Mother   . Hypertension Mother   . Stroke Mother   . Heart attack Mother   . Dementia Father   . Diabetes type II Sister   . Hypertension Sister   . CAD Sister   . Colon cancer Neg Hx   . Esophageal cancer Neg Hx   . Inflammatory bowel disease Neg Hx   . Liver disease Neg Hx   . Pancreatic cancer Neg Hx   . Rectal cancer Neg Hx   . Stomach cancer Neg Hx    Scheduled Meds: . allopurinol  450 mg Oral Daily  . bupivacaine  10 mL Infiltration Once  . carbidopa-levodopa  2 tablet Oral QID  . DULoxetine  60 mg Oral Daily  . furosemide  40 mg Oral Daily  . hydrocortisone  10 mg Oral Q supper  . hydrocortisone  20 mg Oral Daily  . levothyroxine  274 mcg Oral Q0600   And  . [START ON 09/03/2020] levothyroxine  137 mcg Oral Once per day on Thu  . lidocaine  1 patch Transdermal Q24H  . methylPREDNISolone acetate  40 mg Intra-articular Once  . oxyCODONE  20 mg Oral Q12H  . rosuvastatin  20 mg Oral Daily  . senna  1 tablet Oral BID  . sodium chloride flush  3 mL Intravenous Q12H  . topiramate  50 mg Oral BID   Continuous Infusions: . sodium chloride     PRN Meds:.sodium chloride, acetaminophen **OR** acetaminophen, bisacodyl, colchicine, cyclobenzaprine, fentaNYL (SUBLIMAZE) injection, ondansetron **OR** ondansetron (ZOFRAN) IV, oxyCODONE-acetaminophen **AND** oxyCODONE, polyethylene glycol, sodium chloride flush Allergies  Allergen Reactions  . Bee Venom Anaphylaxis  . Shrimp [Shellfish Allergy] Anaphylaxis and Other (See Comments)    "just the protein in the shrimp"  . Stadol [Butorphanol] Anaphylaxis,  Shortness Of Breath and Other (See Comments)    Respiratory distress, couldn't breathe, cardiac arrest  . Wasp Venom Anaphylaxis  . Other Other (See Comments)    Protein supplement - unknown   Review of Systems  Constitutional: Positive for  activity change, appetite change and fatigue.  Respiratory: Negative for shortness of breath.   Gastrointestinal: Positive for constipation.  Neurological: Positive for weakness.       Confusion    Physical Exam Vitals and nursing note reviewed.  Constitutional:      General: He is not in acute distress.    Appearance: He is morbidly obese. He is ill-appearing.  Cardiovascular:     Rate and Rhythm: Normal rate.  Pulmonary:     Effort: Pulmonary effort is normal. No tachypnea, accessory muscle usage or respiratory distress.     Comments: Sleep apnea - CPAP brought from home Abdominal:     General: There is distension.     Palpations: Abdomen is soft.  Neurological:     Mental Status: He is alert and oriented to person, place, and time.     Vital Signs: BP 126/80 (BP Location: Left Arm)   Pulse 73   Temp 97.6 F (36.4 C) (Oral)   Resp 18   Ht _0  (1.956 m)   Wt (!) 160 kg   SpO2 98%   BMI 41.83 kg/m  Pain Scale: 0-10   Pain Score: Asleep   SpO2: SpO2: 98 % O2 Device:SpO2: 98 % O2 Flow Rate: .O2 Flow Rate (L/min): 2 L/min  IO: Intake/output summary:   Intake/Output Summary (Last 24 hours) at 09/02/2020 1020 Last data filed at 09/02/2020 0750 Gross per 24 hour  Intake 200 ml  Output 600 ml  Net -400 ml    LBM: Last BM Date: 09/01/20 Baseline Weight: Weight: (!) 160 kg Most recent weight: Weight: (!) 160 kg     Palliative Assessment/Data:     Time In: 1100 Time Out: 1300 Time Total: 120 min Greater than 50%  of this time was spent counseling and coordinating care related to the above assessment and plan.  Signed by: Vinie Sill, NP Palliative Medicine Team Pager # [phone] (M-F 8a-5p) Team Phone # [phone] (Nights/Weekends)

## 2020-09-02 NOTE — Progress Notes (Signed)
Patient admitted to [phone]. Alert and oriented x4. Vital signs within normal limits. Denies pain when not moving. Oriente to room and remote.

## 2020-09-02 NOTE — Evaluation (Signed)
Physical Therapy Evaluation Patient Details Name: Dennis Livingood Hostetter Sr. MRN: [id number] DOB: 1946/06/15 Today's Date: 09/02/2020   History of Present Illness  Pt is a 75 year old man admitted on 09/01/20 with severe R knee pain and inability to ambulate. Underwent aspiration of knee on 09/02/20. PMH: Parkinsons Disease, CHF, Addison's Disease, PAF, gout, back surgery.  Clinical Impression  PTA, patient was ambulating short distances with cane and using manual or motorized w/c for longer distances. Lived with wife and son who helped with ADLs. Patient presents with severe R knee pain, generalized weakness, impaired functional mobility, and decreased activity tolerance. Patient min guard for bed mobility, however +2 modA for sit to stand from max elevated surface and RW. Unable to take steps this session due to significant pain. Patient will benefit from skilled PT services during acute stay to address listed deficits. Recommend SNF following discharge as patient requires significant amount of assistance for OOB mobility and to assist with decreasing caregiver burden.    Follow Up Recommendations SNF    Equipment Recommendations  None recommended by PT (patient owns necessary DME)    Recommendations for Other Services       Precautions / Restrictions Precautions Precautions: Fall Restrictions Weight Bearing Restrictions: No      Mobility  Bed Mobility Overal bed mobility: Needs Assistance Bed Mobility: Supine to Sit;Sit to Supine     Supine to sit: Min guard;HOB elevated Sit to supine: Min guard   General bed mobility comments: increased time, use of rail    Transfers Overall transfer level: Needs assistance Equipment used: Rolling walker (2 wheeled) Transfers: Sit to/from Stand Sit to Stand: +2 physical assistance;Mod assist;From elevated surface         General transfer comment: bed maximally elevated, assist to rise, pt able to steady himself once upright, but unable to  take steps  Ambulation/Gait             General Gait Details: unable due to pain  Stairs            Wheelchair Mobility    Modified Rankin (Stroke Patients Only)       Balance Overall balance assessment: Needs assistance Sitting-balance support: Bilateral upper extremity supported;Feet supported Sitting balance-Leahy Scale: Fair     Standing balance support: Bilateral upper extremity supported Standing balance-Leahy Scale: Poor Standing balance comment: heavily reliant on UEs                             Pertinent Vitals/Pain Pain Assessment: Faces Faces Pain Scale: Hurts whole lot Pain Location: R LE Pain Descriptors / Indicators: Aching;Grimacing;Guarding Pain Intervention(s): Monitored during session;Premedicated before session;Repositioned    Home Living Family/patient expects to be discharged to:: Private residence Living Arrangements: Spouse/significant other;Children Available Help at Discharge: Family;Available 24 hours/day Type of Home: House Home Access: Ramped entrance     Home Layout: One level Home Equipment: Walker - 2 wheels;Cane - single point;Wheelchair - Education officer, community - power;Other (comment);Shower seat;Bedside commode (lift chair)      Prior Function Level of Independence: Needs assistance   Gait / Transfers Assistance Needed: could walk short distances to bathroom with cane  ADL's / Homemaking Assistance Needed: assisted for bathing, dressing, toileting and all IADL  Comments: family is struggling to care for pt at home     Hand Dominance   Dominant Hand: Right    Extremity/Trunk Assessment   Upper Extremity Assessment Upper Extremity Assessment: Defer to OT  evaluation    Lower Extremity Assessment Lower Extremity Assessment: Generalized weakness (functional weakness >3/5)    Cervical / Trunk Assessment Cervical / Trunk Assessment: Other exceptions Cervical / Trunk Exceptions: increased body habitus, hx  of back sx  Communication   Communication: No difficulties  Cognition Arousal/Alertness: Awake/alert Behavior During Therapy: WFL for tasks assessed/performed Overall Cognitive Status: Impaired/Different from baseline Area of Impairment: Memory;Problem solving                     Memory: Decreased short-term memory       Problem Solving: Slow processing;Decreased initiation;Difficulty sequencing;Requires verbal cues General Comments: poor historian      General Comments      Exercises     Assessment/Plan    PT Assessment Patient needs continued PT services  PT Problem List Decreased strength;Decreased activity tolerance;Decreased balance;Decreased mobility;Decreased range of motion;Pain       PT Treatment Interventions DME instruction;Gait training;Functional mobility training;Therapeutic activities;Therapeutic exercise;Balance training;Patient/family education    PT Goals (Current goals can be found in the Care Plan section)  Acute Rehab PT Goals Patient Stated Goal: go to rehab, pain relief PT Goal Formulation: With patient/family Time For Goal Achievement: 09/16/20 Potential to Achieve Goals: Fair    Frequency Min 2X/week   Barriers to discharge        Co-evaluation PT/OT/SLP Co-Evaluation/Treatment: Yes Reason for Co-Treatment: For patient/therapist safety PT goals addressed during session: Mobility/safety with mobility;Balance OT goals addressed during session: ADL's and self-care       AM-PAC PT "6 Clicks" Mobility  Outcome Measure Help needed turning from your back to your side while in a flat bed without using bedrails?: A Little Help needed moving from lying on your back to sitting on the side of a flat bed without using bedrails?: A Little Help needed moving to and from a bed to a chair (including a wheelchair)?: A Lot Help needed standing up from a chair using your arms (e.g., wheelchair or bedside chair)?: A Lot Help needed to walk in  hospital room?: A Lot Help needed climbing 3-5 steps with a railing? : Total 6 Click Score: 13    End of Session   Activity Tolerance: Patient limited by pain Patient left: in bed;with call bell/phone within reach;with family/visitor present Nurse Communication: Mobility status PT Visit Diagnosis: Unsteadiness on feet (R26.81);Muscle weakness (generalized) (M62.81);Other abnormalities of gait and mobility (R26.89)    Time: 1434-1510 PT Time Calculation (min) (ACUTE ONLY): 36 min   Charges:   PT Evaluation $PT Eval Moderate Complexity: 1 Mod          Alayna A. Gilford Rile PT, DPT Acute Rehabilitation Services Pager [phone] Office [phone]   Linna Hoff 09/02/2020, 4:16 PM

## 2020-09-02 NOTE — Plan of Care (Signed)
  Problem: Health Behavior/Discharge Planning: Goal: Ability to manage health-related needs will improve Outcome: Progressing   

## 2020-09-02 NOTE — TOC Initial Note (Signed)
Transition of Care (TOC) - Initial/Assessment Note    Patient Details  Name: Dennis Madariaga Lunz Sr. MRN: [id number] Date of Birth: 07-12-1945  Transition of Care [facility]) CM/SW Contact:    Emeterio Reeve, [place] Phone Number: 09/02/2020, 2:17 PM  Clinical Narrative:                  CSW met with pt at bedside. CSW introduced self and explained her role at the hospital.  Pts wife [name] was also present for conversation. [name] stated PTA pt was living at home with her. She provides all of his care. Pt has multiple DME equipment including a motorized scooter and lifts. Up until the last few weeks pt could preform all transfers and could walk short distances to bathroom from bed.  Pt is covid vaccinated.   PT/OT have not yet worked with pt, [name] is sure pt will need SNF and she would like for him to go to [facility], he has been there in the past. CSW called [facility] to alert them of pts admission. CSW had to leave a message and requested a call back. CSW confirmed with [facility] that they do not accept VA insurance and pt will have to use his medicare to go to [facility]. Pts wife aware.   Expected Discharge Plan: Skilled Nursing Facility Barriers to Discharge: Continued Medical Work up   Patient Goals and CMS Choice Patient states their goals for this hospitalization and ongoing recovery are:: get better, not be in pain CMS Medicare.gov Compare Post Acute Care list provided to:: Patient Choice offered to / list presented to : Patient  Expected Discharge Plan and Services Expected Discharge Plan: [place]       Living arrangements for the past 2 months: Single Family Home                                      Prior Living Arrangements/Services Living arrangements for the past 2 months: Single Family Home Lives with:: Spouse Patient language and need for interpreter reviewed:: Yes Do you feel safe going back to the place where you live?: Yes      Need for Family  Participation in Patient Care: Yes (Comment) Care giver support system in place?: Yes (comment) Current home services: DME Criminal Activity/Legal Involvement Pertinent to Current Situation/Hospitalization: No - Comment as needed  Activities of Daily Living Home Assistive Devices/Equipment: Eyeglasses ADL Screening (condition at time of admission) Patient's cognitive ability adequate to safely complete daily activities?: Yes Is the patient deaf or have difficulty hearing?: No Does the patient have difficulty seeing, even when wearing glasses/contacts?: No Does the patient have difficulty concentrating, remembering, or making decisions?: No Patient able to express need for assistance with ADLs?: Yes Does the patient have difficulty dressing or bathing?: No Independently performs ADLs?: Yes (appropriate for developmental age) Does the patient have difficulty walking or climbing stairs?: Yes Weakness of Legs: Right Weakness of Arms/Hands: None  Permission Sought/Granted Permission sought to share information with : Chartered certified accountant granted to share information with : Yes, Verbal Permission Granted     Permission granted to share info w AGENCY: SNF  Permission granted to share info w Relationship: Wife     Emotional Assessment Appearance:: Appears stated age Attitude/Demeanor/Rapport: Engaged Affect (typically observed): Appropriate Orientation: : Oriented to Self,Oriented to Situation,Oriented to Place,Oriented to  Time Alcohol / Substance Use: Not  Applicable Psych Involvement: No (comment)  Admission diagnosis:  Back pain [M54.9] Right leg pain [M79.604] Right knee pain [M25.561] Patient Active Problem List   Diagnosis Date Noted  . Right knee pain 09/01/2020  . Abnormal weight loss 08/05/2020  . Dysuria 08/05/2020  . Postlaminectomy syndrome, not elsewhere classified 04/19/2019  . Hypogonadism in male 04/13/2019  . Chronic bilateral low back pain  with bilateral sciatica 04/13/2019  . Pain in finger of left hand 03/04/2019  . Chronic anticoagulation 11/18/2018  . Gouty arthritis 09/28/2017  . Dysautonomia orthostatic hypotension syndrome 08/04/2017  . Hypopituitarism due to empty sella syndrome ([place]) 08/04/2017  . Paroxysmal atrial fibrillation ([place]) 06/30/2017  . Atypical chest pain 06/26/2017  . Primary parkinsonism ([place]) 05/19/2017  . History of prostate cancer 02/10/2017  . S/P prostatectomy 02/10/2017  . Spontaneous bruising 02/08/2017  . Acute on chronic kidney failure ([place]) 12/11/2016  . History of stroke   . Localized swelling of lower extremity 11/15/2016  . Addison disease ([place]) 11/15/2016  . Hypogonadotropic hypogonadism ([place]) 06/09/2016  . Adrenal insufficiency ([place]) 03/18/2016  . Vertigo 03/17/2016  . Essential hypertension   . Hypotension due to drugs   . Acquired hypothyroidism 11/04/2015  . Arthritis 11/04/2015  . Narrowing of intervertebral disc space 11/04/2015  . Major depressive disorder, single episode, mild ([place]) 11/04/2015  . H/o Lyme disease 11/04/2015  . Hypercholesterolemia 11/04/2015  . Morbid obesity ([place]) 11/04/2015  . Temporary cerebral vascular dysfunction 11/04/2015  . Chronic tophaceous gout 09/21/2015  . Sleep apnea 08/03/2014  . Chronic diastolic CHF (congestive heart failure) ([place]) 08/03/2014  . CAD (coronary artery disease) 08/02/2014  . S/P lumbar spinal fusion 07/17/2013  . Chronic back pain    PCP:  [name], MD Pharmacy:   [id number] [address], [place] [address] Phone: [phone] Fax: [phone]     Social Determinants of Health ([place]) Interventions    Readmission Risk Interventions Readmission Risk Prevention Plan 02/24/2019  Transportation Screening Complete  PCP or Specialist Appt within 5-7 Days Complete  Home Care Screening Complete  Medication  Review (RN CM) Complete  Some recent data might be hidden   Emeterio Reeve, Latanya Presser, [place] Social Worker [phone]
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
## 2020-09-02 NOTE — Evaluation (Signed)
Occupational Therapy Evaluation Patient Details Name: Dennis Sonn Trawick Sr. MRN: [id number] DOB: January 01, 1946 Today's Date: 09/02/2020    History of Present Illness Pt is a 75 year old man admitted on 09/01/20 with severe R knee pain and inability to ambulate. Underwent aspiration of knee on 09/02/20. PMH: Parkinsons Disease, CHF, Addison's Disease, PAF, gout, back surgery.   Clinical Impression   Pt was ambulating with a cane short distances, using manual or motorized w/c for longer distances. Pt is assisted for bathing, dressing and toileting and self feeds and grooms himself. Pt presents with significant R knee pain. He was able to get himself to the EOB without physical assist and stand with +2 mod assist from elevated bed, but unable to take steps. Will follow acutely. Recommending SNF, pt and wife in agreement.     Follow Up Recommendations  SNF;Supervision/Assistance - 24 hour    Equipment Recommendations  None recommended by OT    Recommendations for Other Services       Precautions / Restrictions Precautions Precautions: Fall      Mobility Bed Mobility Overal bed mobility: Needs Assistance Bed Mobility: Supine to Sit;Sit to Supine     Supine to sit: Min guard;HOB elevated Sit to supine: Min guard   General bed mobility comments: increased time, use of rail    Transfers Overall transfer level: Needs assistance Equipment used: Rolling walker (2 wheeled) Transfers: Sit to/from Stand Sit to Stand: +2 physical assistance;Mod assist;From elevated surface         General transfer comment: bed maximally elevated, assist to rise, pt able to steady himself once upright, but unable to take steps    Balance Overall balance assessment: Needs assistance   Sitting balance-Leahy Scale: Fair       Standing balance-Leahy Scale: Poor Standing balance comment: heavily reliant on UEs                           ADL either performed or assessed with clinical  judgement   ADL Overall ADL's : Needs assistance/impaired Eating/Feeding: Independent;Bed level   Grooming: Set up;Sitting   Upper Body Bathing: Moderate assistance;Sitting   Lower Body Bathing: Total assistance;+2 for physical assistance;Sit to/from stand   Upper Body Dressing : Set up;Sitting   Lower Body Dressing: +2 for physical assistance;Total assistance;Sitting/lateral leans       Toileting- Clothing Manipulation and Hygiene: Total assistance;Bed level               Vision Baseline Vision/History: Wears glasses Wears Glasses: At all times Patient Visual Report: No change from baseline       Perception     Praxis      Pertinent Vitals/Pain Pain Assessment: Faces Faces Pain Scale: Hurts whole lot Pain Location: R LE Pain Descriptors / Indicators: Aching;Grimacing;Guarding Pain Intervention(s): Monitored during session;Premedicated before session;Repositioned     Hand Dominance Right   Extremity/Trunk Assessment Upper Extremity Assessment Upper Extremity Assessment: Overall WFL for tasks assessed   Lower Extremity Assessment Lower Extremity Assessment: Defer to PT evaluation   Cervical / Trunk Assessment Cervical / Trunk Assessment: Other exceptions Cervical / Trunk Exceptions: increased body habitus, hx of back sx   Communication Communication Communication: No difficulties   Cognition Arousal/Alertness: Awake/alert Behavior During Therapy: WFL for tasks assessed/performed Overall Cognitive Status: Impaired/Different from baseline Area of Impairment: Memory;Problem solving                     Memory:  Decreased short-term memory       Problem Solving: Slow processing;Decreased initiation;Difficulty sequencing;Requires verbal cues General Comments: poor historian   General Comments       Exercises     Shoulder Instructions      Home Living Family/patient expects to be discharged to:: Private residence Living Arrangements:  Spouse/significant other;Children Available Help at Discharge: Family;Available 24 hours/day Type of Home: House Home Access: Ramped entrance     Home Layout: One level     Bathroom Shower/Tub: Occupational psychologist: Handicapped height     Home Equipment: Environmental consultant - 2 wheels;Cane - single point;Wheelchair - Education officer, community - power;Other (comment);Shower seat;Bedside commode (lift chair)          Prior Functioning/Environment Level of Independence: Needs assistance  Gait / Transfers Assistance Needed: could walk short distances to bathroom with cane ADL's / Homemaking Assistance Needed: assisted for bathing, dressing, toileting and all IADL   Comments: family is struggling to care for pt at home        OT Problem List: Decreased strength;Impaired balance (sitting and/or standing);Decreased activity tolerance;Decreased knowledge of use of DME or AE;Pain;Obesity;Decreased cognition      OT Treatment/Interventions: Self-care/ADL training;DME and/or AE instruction;Patient/family education;Balance training;Therapeutic activities    OT Goals(Current goals can be found in the care plan section) Acute Rehab OT Goals Patient Stated Goal: go to rehab, pain relief OT Goal Formulation: With patient Time For Goal Achievement: 09/16/20 Potential to Achieve Goals: Fair ADL Goals Pt Will Transfer to Toilet: with mod assist;ambulating;bedside commode  OT Frequency: Min 1X/week   Barriers to D/C:            Co-evaluation PT/OT/SLP Co-Evaluation/Treatment: Yes Reason for Co-Treatment: For patient/therapist safety   OT goals addressed during session: ADL's and self-care      AM-PAC OT "6 Clicks" Daily Activity     Outcome Measure Help from another person eating meals?: None Help from another person taking care of personal grooming?: A Little Help from another person toileting, which includes using toliet, bedpan, or urinal?: Total Help from another person bathing  (including washing, rinsing, drying)?: A Lot Help from another person to put on and taking off regular upper body clothing?: A Little Help from another person to put on and taking off regular lower body clothing?: Total 6 Click Score: 14   End of Session Equipment Utilized During Treatment: Rolling walker  Activity Tolerance: Patient limited by pain Patient left: in bed;with call bell/phone within reach;with family/visitor present  OT Visit Diagnosis: Unsteadiness on feet (R26.81);Pain                Time: 3646-8032 OT Time Calculation (min): 35 min Charges:  OT General Charges $OT Visit: 1 Visit OT Evaluation $OT Eval Moderate Complexity: 1 Mod  Nestor Lewandowsky, OTR/L Acute Rehabilitation Services Pager: [phone] Office: [phone]  Malka So 09/02/2020, 3:52 PM

## 2020-09-02 NOTE — Telephone Encounter (Signed)
09/02/20 @1035AM : Palliative care SW spoke with patients spouse per NP request for support and resources.   Wife shares that patient is currently in the hospital as of yesterday. Spouse states that patient became very weak and complained of leg pain. Spouse shares her caregiver fatigue and that she is in need of support for patient. Spouse shares that their son is supportive and assist daily but is becoming burnt out as well. Spouse shares that she does not feel she can no longer care for patient in the home. SW discussed VA benefits and Medicaid versus private caregiver services as well as hospice. Spouse shared that she has discussed VA benefits with [facility] staff and has been told that patient does not qualify for VA pension or compensation to assist with aid & attendance benefits. Patients social security income is too high to receive community Medicaid benefits. Spouse shared that she discussed hospice eligibility with palliative care NP in the past, patient did not meet the weight loss criteria to qualify for hospice at the time. Spouse shared that she will discuss her options with hospital SW as well and express her desire for patient to DC/transition to [facility]. SW encouraged spouse to request assistance with LTC Medicaid as well.   SW provided active and supportive listening during call and encouraged spouse to outreach SW should she have any further questions/concerns.

## 2020-09-02 NOTE — Care Management Obs Status (Addendum)
Dennis [name] NOTIFICATION   Patient Details  Name: Dennis Arps Batterson Sr. MRN: [id number] Date of Birth: 01-Aug-1945 Patient and wife wanting SNF. SW looking into [employer].  Started to discuss observation letter. Wife became upset. Stated she will appeal observation status.  Medicare Observation Status Notification Given:  Yes    Marilu Favre, RN 09/02/2020, 1:37 PM

## 2020-09-03 DIAGNOSIS — M25561 Pain in right knee: Secondary | ICD-10-CM | POA: Diagnosis not present

## 2020-09-03 DIAGNOSIS — G20 Parkinson's disease: Secondary | ICD-10-CM | POA: Diagnosis not present

## 2020-09-03 DIAGNOSIS — Z7189 Other specified counseling: Secondary | ICD-10-CM | POA: Diagnosis not present

## 2020-09-03 DIAGNOSIS — E2740 Unspecified adrenocortical insufficiency: Secondary | ICD-10-CM | POA: Diagnosis not present

## 2020-09-03 DIAGNOSIS — Z515 Encounter for palliative care: Secondary | ICD-10-CM | POA: Diagnosis not present

## 2020-09-03 DIAGNOSIS — I10 Essential (primary) hypertension: Secondary | ICD-10-CM

## 2020-09-03 DIAGNOSIS — R52 Pain, unspecified: Secondary | ICD-10-CM

## 2020-09-03 DIAGNOSIS — M5441 Lumbago with sciatica, right side: Secondary | ICD-10-CM

## 2020-09-03 DIAGNOSIS — M545 Low back pain, unspecified: Secondary | ICD-10-CM | POA: Diagnosis not present

## 2020-09-03 LAB — SYNOVIAL CELL COUNT + DIFF, W/ CRYSTALS
Crystals, Fluid: NONE SEEN
Eosinophils-Synovial: 0 % (ref 0–1)
Lymphocytes-Synovial Fld: 1 % (ref 0–20)
Monocyte-Macrophage-Synovial Fluid: 1 % — ABNORMAL LOW (ref 50–90)
Neutrophil, Synovial: 98 % — ABNORMAL HIGH (ref 0–25)
WBC, Synovial: 4150 /mm3 — ABNORMAL HIGH (ref 0–200)

## 2020-09-03 MED ORDER — FENTANYL 50 MCG/HR TD PT72
1.0000 | MEDICATED_PATCH | TRANSDERMAL | Status: DC
  Administered 2020-09-03 – 2020-09-09 (×3): 1 via TRANSDERMAL
  Filled 2020-09-03 (×3): qty 1

## 2020-09-03 MED ORDER — SENNA 8.6 MG PO TABS
2.0000 | ORAL_TABLET | Freq: Two times a day (BID) | ORAL | Status: DC
  Administered 2020-09-03 – 2020-09-10 (×14): 17.2 mg via ORAL
  Filled 2020-09-03 (×14): qty 2

## 2020-09-03 MED ORDER — BISACODYL 10 MG RE SUPP
10.0000 mg | Freq: Once | RECTAL | Status: AC
  Administered 2020-09-03: 10 mg via RECTAL
  Filled 2020-09-03: qty 1

## 2020-09-03 MED ORDER — POLYETHYLENE GLYCOL 3350 17 G PO PACK
17.0000 g | PACK | Freq: Every day | ORAL | Status: DC
  Administered 2020-09-03 – 2020-09-09 (×5): 17 g via ORAL
  Filled 2020-09-03 (×8): qty 1

## 2020-09-03 MED ORDER — APIXABAN 5 MG PO TABS
5.0000 mg | ORAL_TABLET | Freq: Two times a day (BID) | ORAL | Status: DC
  Administered 2020-09-03 – 2020-09-10 (×14): 5 mg via ORAL
  Filled 2020-09-03 (×14): qty 1

## 2020-09-03 MED ORDER — OXYCODONE HCL ER 15 MG PO T12A
30.0000 mg | EXTENDED_RELEASE_TABLET | Freq: Once | ORAL | Status: AC
  Administered 2020-09-03: 30 mg via ORAL
  Filled 2020-09-03: qty 2

## 2020-09-03 NOTE — Progress Notes (Addendum)
Palliative:  HPI: 75 y.o. male  with past medical history of diastolic heart failure, hypertension, paroxysmal atrial fibrillation on anticoagulation, OSA on CPAP, hypogonadotrophic hypogonadism, Parkinson's disease (wheelchair bound), Addison's disease, h/o testicular and prostate cancer, gout, chronic right knee pain, chronic pain syndrome admitted on 09/01/2020 with worsening right knee pain. Followed by outpatient palliative care with [facility] last visit documented 08/20/20 where he is documented to be DNR and nearing hospice eligibility and that patient and family open to hospice support at home. Orthopedics to see for knee pain.   I met today at [facility] bedside. His wife was not initially present but on her way. He was complaining of pain in right knee and down right leg. RN notified to give pain medication. Shortly after receiving pain medication (OxyIR) he was having some confusion with visual hallucinations and was aware of his confusion. This confusion began to clear throughout my visit. Long discussion with he and wife at bedside regarding pain management and plan to move forward. We decided on the pain plan as described below. He has had ongoing worsening chronic pain that has been impacting his functional status now complicated by an acute right knee pain that has caused significant decline. We all agree that even without the acute knee pain he needs adjustment for his chronic pain regimen as he has been titrated up his Xtampza ER over the past year or more and likely time for opioid rotation. We did briefly discuss the difficulty in prioritizing pain control vs functional status in some situations when patients worsen and for them to think and discuss about what is most important to them along this journey.   Wife is anxious about where to go from here. They worry about rehab with his acute pain in right knee but I do feel he could benefit from even focus on upper body strength to compensate and  assist with transfers. Discussed plan for applying for long term care Medicaid and to do this sooner than later and that this is typically retroactive but what individual facilities require in the interim before Medicaid is approved depends on the facility. CSW in to discuss and request beds at other facilities as [facility] has denied him a bed.   Exam: Alert, oriented but had confusion and visual hallucination briefly after pain medication given but then reoriented after some time but during my visit. No distress. Ongoing pain with neuropathy from right knee down leg and chronic pain with neuropathy down right leg as well. Moves all extremities although very limited ROM in lower extremities.   Plan:   Chronic pain from low back and right knee (difficult to differentiate symptoms from back vs knee): ? Spinal stimulator in place.  ? Previously no relief from gabapentin or Tylenol but this could give him some relief from his acute right knee pain even if not for his overall chronic pain. May consider adding in near future.  ? Fentanyl patch 50 mcg/hr 09/03/20. OxyCONTIN 40 mg every 12 hours (equivolent dosage to home dose Xtampza ER 36 mg every 12 hours) to be d/c. Ordered OxyCONTIN 30 mg for night 09/03/20 and then no more OxyCONTIN ordered. Palliative to reassess pain management in the morning. Monitor closely for efficacy and confusion.  ? Oxycodone-acetaminophen 10-325 mg every 4 hours. Patient reports taking 2-3 of these at a time when pain gets bad. Amy reports that he often has pills left over at end of the month.  ? Continue lidoderm patches to low back and right  knee.  ? Continue flexeril TID as needed. Consider scheduled dosing. ? Continue Cymbalta.  ? Consider consulting Dr. Sherley Bounds as wife mentions that he has previously mentioned was seems like potentially nerve ablation.   Bowel Regimen:  ? LBM 08/29/20.  ? Mag citrate given 09/02/20 without effect. Dulcolax suppository to be given  09/03/20.  ? Increasing senokot to 2 tabs twice daily 09/03/20.    [id number] 292 min  Vinie Sill, NP Palliative Medicine Team Pager [phone] (Please see [email or web address] for schedule) Team Phone [phone]    Greater than 50%  of this time was spent counseling and coordinating care related to the above assessment and plan

## 2020-09-03 NOTE — Progress Notes (Addendum)
Subjective:    Patient continues to have severe pain with movement of the right leg. States it is mainly at back of right knee and right heel.   Objective:  PE: VITALS:   Vitals:   09/02/20 0532 09/02/20 1452 09/02/20 2010 09/03/20 0547  BP: 126/80 101/84 115/83 137/88  Pulse: 73 91 75 75  Resp: 18 18 17 17   Temp: 97.6 F (36.4 C) 97.6 F (36.4 C) 98.4 F (36.9 C) 97.7 F (36.5 C)  TempSrc: Oral Oral Oral Oral  SpO2: 98% 97% 95% 98%  Weight:      Height:       General: Sitting up in bed, in no acute distress MSK: Right knee effusion. Approx 40-55 deg of AROM of knee. Dorsiflexion and Plantarflexion intact. Distal sensation intact. + DP pulse. TTP to palpation to posterior right knee. Mildly tender to achilles insertion. Thompson's negative.  LABS  Results for orders placed or performed during the hospital encounter of 09/01/20 (from the past 24 hour(s))  Synovial cell count + diff, w/ crystals     Status: Abnormal   Collection Time: 09/02/20  1:23 PM  Result Value Ref Range   Color, Synovial ORANGE (A) YELLOW   Appearance-Synovial CLOUDY (A) CLEAR   Crystals, Fluid NO CRYSTALS SEEN    WBC, Synovial 83 0 - 200 /cu mm   Neutrophil, Synovial 98 (H) 0 - 25 %   Lymphocytes-Synovial Fld 1 0 - 20 %   Monocyte-Macrophage-Synovial Fluid 1 (L) 50 - 90 %   Eosinophils-Synovial 0 0 - 1 %  Body fluid culture w Gram Stain     Status: None (Preliminary result)   Collection Time: 09/02/20  1:23 PM   Specimen: Body Fluid  Result Value Ref Range   Specimen Description FLUID KNEE RIGHT    Special Requests SYNOVIAL FLUID RIGHT KNEE    Gram Stain      ABUNDANT WBC PRESENT, PREDOMINANTLY PMN NO ORGANISMS SEEN Performed at [facility], [address], [place], [place] [address]    Culture PENDING    Report Status PENDING     DG Chest 2 View  Result Date: 09/01/2020 CLINICAL DATA:  Hypoxemia. EXAM: CHEST - 2 VIEW COMPARISON:  02/21/2019 FINDINGS: Hypoventilation.  Decreased lung volume with mild atelectasis in the bases. Negative for heart failure or pneumonia. No pleural effusion. Spinal cord stimulator midthoracic spine has been placed in the interval. Cardiac loop recorder has been removed. IMPRESSION: Hypoventilation with bibasilar atelectasis. Electronically Signed   By: Franchot Gallo M.D.   On: 09/01/2020 15:02   CT Head Wo Contrast  Result Date: 09/01/2020 CLINICAL DATA:  Previous history of stroke right hip pain EXAM: CT HEAD WITHOUT CONTRAST TECHNIQUE: Contiguous axial images were obtained from the base of the skull through the vertex without intravenous contrast. COMPARISON:  MRI 06/27/2017, 08/30/2017, CT brain 06/27/2017 FINDINGS: Brain: No acute territorial infarction, hemorrhage, or intracranial mass is visualized. Chronic lacunar infarcts within the left basal ganglia and white matter. Mild to moderate chronic small vessel ischemic change of the white matter. Moderate atrophy. Stable ventricle size. Vascular: No hyperdense vessels. Vertebral and carotid vascular calcification Skull: Normal. Negative for fracture or focal lesion. Sinuses/Orbits: Mucosal thickening in the sinuses Other: None IMPRESSION: 1. No CT evidence for acute intracranial abnormality. 2. Atrophy and chronic small vessel ischemic changes of the white matter. Chronic lacunar infarcts in the left basal ganglia and white matter. Electronically Signed   By: Madie Reno.D.  On: 09/01/2020 18:47   CT L-SPINE NO CHARGE  Result Date: 09/01/2020 CLINICAL DATA:  Sciatica and chronic pain to right hip and right knee, progressive. EXAM: CT LUMBAR SPINE WITHOUT CONTRAST TECHNIQUE: Multidetector CT imaging of the lumbar spine was performed without intravenous contrast administration. Multiplanar CT image reconstructions were also generated. COMPARISON:  MRI lumbar spine 02/22/2019. FINDINGS: Segmentation: Inferior-most fully formed intervertebral disc is labeled L5-S1. Alignment: No  substantial subluxation. Vertebrae: L2-L5 PLIF. Evidence of osseous fusion across L2-L3, L3-L4, and L4-L5. Vertebral body heights are maintained. No evidence of acute fracture. Paraspinal and other soft tissues: Calcific atherosclerosis of the aorta. Partially imaged, spinal cord stimulator which enters the canal at T11-T12 dorsally. Disc levels: At L2-L3 there is focal right subarticular bony spurring, which may affect the descending right L3 nerve root and was demonstrated on prior MRI. Multilevel facet hypertrophy. Limited evaluation of the canal given absence of contrast and streak artifact from fusion IMPRESSION: 1. No evidence of acute fracture or traumatic malalignment. 2. At L2-L3 there is focal right subarticular bony spurring, which may affect the descending right L3 nerve root and was demonstrated on prior MRI. 3. Limited evaluation of the canal due to streak artifact from hardware and absence of contrast. An MRI or CT myelogram could further characterize the canal/cord/foramina if clinically indicated. Electronically Signed   By: Margaretha Sheffield MD   On: 09/01/2020 18:45   CT Renal Stone Study  Result Date: 09/01/2020 CLINICAL DATA:  Right flank pain EXAM: CT ABDOMEN AND PELVIS WITHOUT CONTRAST TECHNIQUE: Multidetector CT imaging of the abdomen and pelvis was performed following the standard protocol without IV contrast. COMPARISON:  CT 02/08/2018 FINDINGS: Lower chest: Lung bases demonstrate no acute consolidation or effusion. Bilateral gynecomastia. Coronary vascular calcification. Hepatobiliary: No focal liver abnormality is seen. No gallstones, gallbladder wall thickening, or biliary dilatation. Pancreas: Unremarkable. No pancreatic ductal dilatation or surrounding inflammatory changes. Spleen: Normal in size without focal abnormality. Adrenals/Urinary Tract: Adrenal glands are unremarkable. Kidneys show no hydronephrosis. Negative for ureteral stone. Bladder is unremarkable Stomach/Bowel:  Stomach is within normal limits. Appendix appears normal. No evidence of bowel wall thickening, distention, or inflammatory changes. Vascular/Lymphatic: Mild aortic atherosclerosis. No aneurysm. No suspicious nodes Reproductive: Negative for mass. Other: Negative for free air or free fluid. Right greater than left fat containing inguinal hernias. Musculoskeletal: Orthopedic hardware L2 through L5, see separately dictated lumbar spine CT report. Ascending thoracic stimulator leads terminating at T7-T8. IMPRESSION: 1. Negative for hydronephrosis or ureteral stone. 2. No CT evidence for acute intra-abdominal or pelvic abnormality. Aortic Atherosclerosis (ICD10-I70.0). Electronically Signed   By: Donavan Foil M.D.   On: 09/01/2020 19:07   VAS [name] LOWER EXTREMITY VENOUS (DVT) (ONLY [facility] & [facility])  Result Date: 09/02/2020  Lower Venous DVT Study Indications: RLE pain.  Risk Factors: HX of arthritis and sciatica. Comparison Study: Last exam was on 07/16/18 - negative Performing Technologist: Rogelia Rohrer  Examination Guidelines: A complete evaluation includes B-mode imaging, spectral Doppler, color Doppler, and power Doppler as needed of all accessible portions of each vessel. Bilateral testing is considered an integral part of a complete examination. Limited examinations for reoccurring indications may be performed as noted. The reflux portion of the exam is performed with the patient in reverse Trendelenburg.  +---------+---------------+---------+-----------+----------+--------------+ RIGHT    CompressibilityPhasicitySpontaneityPropertiesThrombus Aging +---------+---------------+---------+-----------+----------+--------------+ CFV      Full           Yes      Yes                                 +---------+---------------+---------+-----------+----------+--------------+  SFJ      Full                                                         +---------+---------------+---------+-----------+----------+--------------+ FV Prox  Full           Yes      Yes                                 +---------+---------------+---------+-----------+----------+--------------+ FV Mid   Full           Yes      Yes                                 +---------+---------------+---------+-----------+----------+--------------+ FV DistalFull           Yes      Yes                                 +---------+---------------+---------+-----------+----------+--------------+ PFV      Full                                                        +---------+---------------+---------+-----------+----------+--------------+ POP      Full           Yes      Yes                                 +---------+---------------+---------+-----------+----------+--------------+ PTV      Full                                                        +---------+---------------+---------+-----------+----------+--------------+ PERO     Full                                                        +---------+---------------+---------+-----------+----------+--------------+   +----+---------------+---------+-----------+----------+--------------+ LEFTCompressibilityPhasicitySpontaneityPropertiesThrombus Aging +----+---------------+---------+-----------+----------+--------------+ CFV Full           Yes      Yes                                 +----+---------------+---------+-----------+----------+--------------+     Summary: RIGHT: - There is no evidence of deep vein thrombosis in the lower extremity. - There is no evidence of superficial venous thrombosis.  - No cystic structure found in the popliteal fossa.  LEFT: - No evidence of common femoral vein obstruction.  *See table(s) above for measurements and observations. Electronically signed by Deitra Mayo MD on 09/02/2020 at 5:58:03 AM.    Final  Assessment/Plan: Acute flare of right knee  osteoarthritis: - pain improved since yesterday however continues to have pain with ROM. - right knee aspiration 2/19 show no crystals, cultures continue to be negative for any organisms - repeat aspiration performed yesterday - WBC seen, so far no organisms, no crystals seen - continue home gout medications, though with no crystals seen on last aspiration - likely not gout as etiology - patient has known osteoarthritis in his right knee and has had multiple arthritic flares requiring a cortisone injection, performed cortisone injection yesterday.  - ok to WBAT RLE, up with PT as needed - no immediate need for surgical intervention, ok to resume anticoagulation  Follow - up plan: Spoke with patient and wife regarding follow up, he will plan to follow up with his orthopedist Dr. Michaelle Birks upon discharge. We are happy to see him at [facility] as well 2 weeks after surgery if they choose to follow up with [name].   Dispo: PT recommending SNF. TOC on board.  Contact information:   Weekdays 8-5 [address], [place] [phone] A fter hours and holidays please check [email or web address] for group call information for [facility]  Ventura Bruns 09/03/2020, 6:37 AM

## 2020-09-03 NOTE — Progress Notes (Signed)
PROGRESS NOTE    Dennis Hasting Sr.  EVO:[id number] DOB: 12-23-1945 DOA: 09/01/2020 PCP: [name], MD    Brief Narrative:  75 y.o. male with medical history significant of chronic diastolic CHF, HTN, hypogonadotrophic hypogonadism, Parkinson disease, paroxysmal atrial fibrillation on anticoagulation, hypothyroidism, gout, chronic right knee pain, chronic pain syndrome Pt presented with   worsening right knee pain.  Initially was seen on 19 February presented to emergency department with severe right knee pain unable to ambulate at all. Limited range of motion passively and actively. Knee tap was done at the time showed inflammatory changes Cultures still no growth.  Assessment & Plan:   Active Problems:   CAD (coronary artery disease)   Chronic diastolic CHF (congestive heart failure) (HCC)   Acquired hypothyroidism   Hypercholesterolemia   Morbid obesity (HCC)   Essential hypertension   Adrenal insufficiency (HCC)   Primary parkinsonism (HCC)   Paroxysmal atrial fibrillation ([place])   Hypopituitarism due to empty sella syndrome ([place])   Right knee pain   Present on Admission: . Right knee pain  -Non-infectious and no crystals seen on fluid analysis -Orthopedic following. Pt has known hx of osteoarthritic changes -Plan today for re-tap with injection of knee -PT/OT consulted with recs for SNF -This AM when seen with Palliative Care at bedside, pt was completely immobile and not tolerating even passive movement of RLE, secondary to marked and uncontrolled pain. Adjustment to long-acting analgesia per Palliative Care -Is following for possible SNF placement  . Primary parkinsonism ([place])  -chronic stable continue home medication -Therapy recs for SNF noted   . Paroxysmal atrial fibrillation (HCC)  -Anticoagulation was placed on hold anticoagulation at time of presentation in anticipation of knee aspiration -Per Orthopedic Surgery, OK to resume anticoagulation, will  resume  . Morbid obesity ([place])  -Recommend diet/lifestyle modification  . Hypopituitarism due to empty sella syndrome ([place]) resume home medications  . Hypercholesterolemia -chronic stable continue crest  . Essential hypertension  -BP remains stable and controlled at this time  . Chronic diastolic CHF (congestive heart failure) (HCC) continue Lasix and -Currently appears euvolemic  . CAD (coronary artery disease) -chronic continue statin currently no chest pain Does not tolerate beta-blocker  . Adrenal insufficiency ([place]) continue Cortef  . Acquired hypothyroidism continue home medication check TSH  History of chronic pain continue home medications as tolerated and cont to titrate to pt comfort -Appreciate Palliative Care input regarding analgesia recs above  Transient hypoxia -currently on room air no monitor for any evidence of hypoxia with pain medication administration.   DVT prophylaxis: SCD's Code Status: DNR Family Communication: Pt in room, family at bedside  Status is: Inpatient  Requires inpatient stay because: Ongoing active pain requiring inpatient pain management and Unsafe d/c plan  Dispo: The patient is from: Home              Anticipated d/c is to: SNF              Anticipated d/c date is: 2 days              Patient currently is not medically stable to d/c.   Difficult to place patient No       Consultants:   Orthopedic Surgery  Palliative Care  Procedures:     Antimicrobials: Anti-infectives (From admission, onward)   None      Subjective: Still with marked LE pain, but improved from yesterday  Objective: Vitals:   09/02/20 1452 09/02/20 2010 09/03/20 0547  09/03/20 1242  BP: 101/84 115/83 137/88 115/79  Pulse: 91 75 75 71  Resp: 18 17 17 18   Temp: 97.6 F (36.4 C) 98.4 F (36.9 C) 97.7 F (36.5 C) 98.1 F (36.7 C)  TempSrc: Oral Oral Oral Oral  SpO2: 97% 95% 98% 98%  Weight:      Height:        Intake/Output  Summary (Last 24 hours) at 09/03/2020 1656 Last data filed at 09/03/2020 1400 Gross per 24 hour  Intake 450 ml  Output 1300 ml  Net -850 ml   Filed Weights   09/02/20 0250  Weight: (!) 160 kg    Examination: General exam: Awake, laying in bed, in nad Respiratory system: Normal respiratory effort, no wheezing Cardiovascular system: regular rate, s1, s2 Gastrointestinal system: Soft, nondistended, positive BS Central nervous system: CN2-12 grossly intact, strength intact Extremities: Perfused, no clubbing Skin: Normal skin turgor, no notable skin lesions seen Psychiatry: Mood normal // no visual hallucinations   Data Reviewed: I have personally reviewed following labs and imaging studies  CBC: Recent Labs  Lab 08/29/20 1440 09/01/20 1352 09/02/20 0137  WBC 6.9 9.8 9.8  NEUTROABS 5.6  --  8.6*  HGB 16.0 15.6 14.1  HCT 49.0 48.0 44.4  MCV 83.6 85.0 84.3  PLT 132* 203 732   Basic Metabolic Panel: Recent Labs  Lab 08/29/20 1440 09/01/20 1352 09/02/20 0137  NA 133* 132* 131*  K 4.1 4.2 4.1  CL 94* 96* 96*  CO2 28 25 27   GLUCOSE 120* 142* 115*  BUN 19 23 21   CREATININE 1.12 1.17 0.98  CALCIUM 9.7 9.5 9.3  MG  --   --  2.1  PHOS  --   --  3.0   GFR: Estimated Creatinine Clearance: 109.9 mL/min (by C-G formula based on SCr of 0.98 mg/dL). Liver Function Tests: Recent Labs  Lab 09/02/20 0137  AST 13*  ALT 5  ALKPHOS 47  BILITOT 1.2  PROT 6.8  ALBUMIN 3.4*   No results for input(s): LIPASE, AMYLASE in the last 168 hours. No results for input(s): AMMONIA in the last 168 hours. Coagulation Profile: No results for input(s): INR, PROTIME in the last 168 hours. Cardiac Enzymes: Recent Labs  Lab 09/01/20 2047  CKTOTAL 22*   BNP (last 3 results) No results for input(s): PROBNP in the last 8760 hours. HbA1C: No results for input(s): HGBA1C in the last 72 hours. CBG: No results for input(s): GLUCAP in the last 168 hours. Lipid Profile: No results for  input(s): CHOL, HDL, LDLCALC, TRIG, CHOLHDL, LDLDIRECT in the last 72 hours. Thyroid Function Tests: Recent Labs    09/02/20 0137  TSH 1.778   Anemia Panel: No results for input(s): VITAMINB12, FOLATE, FERRITIN, TIBC, IRON, RETICCTPCT in the last 72 hours. Sepsis Labs: Recent Labs  Lab 09/01/20 2120 09/02/20 0137  LATICACIDVEN 0.9 0.9    Recent Results (from the past 240 hour(s))  Gram stain     Status: None   Collection Time: 08/29/20  7:32 PM   Specimen: KNEE; Synovial Fluid  Result Value Ref Range Status   Specimen Description KNEE  Final   Special Requests NONE  Final   Gram Stain   Final    WBC SEEN FEW NO ORGANISMS SEEN Performed at [facility] [address] [place], [place] [address]    Report Status 08/29/2020 FINAL  Final  Body fluid culture w Gram Stain     Status: None   Collection Time: 08/29/20  7:32 PM   Specimen: KNEE; Synovial Fluid  Result Value Ref Range Status   Specimen Description   Final    KNEE Performed at [facility] [address] [place], [place] [address]    Special Requests   Final    NONE Performed at [facility], [place] [address], [place], [place] [address]    Gram Stain   Final    FEW WBC PRESENT, PREDOMINANTLY PMN NO ORGANISMS SEEN    Culture   Final    NO GROWTH 3 DAYS Performed at [facility] [address] [place], [place] [address]    Report Status 09/02/2020 FINAL  Final  SARS CORONAVIRUS 2 (TAT 6-24 HRS) Nasopharyngeal Nasopharyngeal Swab     Status: None   Collection Time: 09/01/20  5:18 PM   Specimen: Nasopharyngeal Swab  Result Value Ref Range Status   SARS Coronavirus 2 NEGATIVE NEGATIVE Final    Comment: (NOTE) SARS-CoV-2 target nucleic acids are NOT DETECTED.  The SARS-CoV-2 RNA is generally detectable in upper and lower respiratory specimens during the acute phase of infection. Negative results do not preclude SARS-CoV-2 infection, do not  rule out co-infections with other pathogens, and should not be used as the sole basis for treatment or other patient management decisions. Negative results must be combined with clinical observations, patient history, and epidemiological information. The expected result is Negative.  Fact Sheet for Patients: SugarRoll.be  Fact Sheet for Healthcare Providers: https://www.woods-mathews.com/  This test is not yet approved or cleared by the [place] FDA and  has been authorized for detection and/or diagnosis of SARS-CoV-2 by FDA under an Emergency Use Authorization (EUA). This EUA will remain  in effect (meaning this test can be used) for the duration of the COVID-19 declaration under Se ction 564(b)(1) of the Act, 21 U.S.C. section 360bbb-3(b)(1), unless the authorization is terminated or revoked sooner.  Performed at [facility], [place] [address] [place], [place] [address]   Body fluid culture w Gram Stain     Status: None (Preliminary result)   Collection Time: 09/02/20  1:23 PM   Specimen: Body Fluid  Result Value Ref Range Status   Specimen Description FLUID KNEE RIGHT  Final   Special Requests SYNOVIAL FLUID RIGHT KNEE  Final   Gram Stain   Final    ABUNDANT WBC PRESENT, PREDOMINANTLY PMN NO ORGANISMS SEEN    Culture   Final    NO GROWTH < 24 HOURS Performed at [facility], [place] [address], [place], [place] [address]    Report Status PENDING  Incomplete     Radiology Studies: CT Head Wo Contrast  Result Date: 09/01/2020 CLINICAL DATA:  Previous history of stroke right hip pain EXAM: CT HEAD WITHOUT CONTRAST TECHNIQUE: Contiguous axial images were obtained from the base of the skull through the vertex without intravenous contrast. COMPARISON:  MRI 06/27/2017, 08/30/2017, CT brain 06/27/2017 FINDINGS: Brain: No acute territorial infarction, hemorrhage, or intracranial mass is visualized. Chronic lacunar infarcts  within the left basal ganglia and white matter. Mild to moderate chronic small vessel ischemic change of the white matter. Moderate atrophy. Stable ventricle size. Vascular: No hyperdense vessels. Vertebral and carotid vascular calcification Skull: Normal. Negative for fracture or focal lesion. Sinuses/Orbits: Mucosal thickening in the sinuses Other: None IMPRESSION: 1. No CT evidence for acute intracranial abnormality. 2. Atrophy and chronic small vessel ischemic changes of the white matter. Chronic lacunar infarcts in the left basal ganglia and  white matter. Electronically Signed   By: Donavan Foil M.D.   On: 09/01/2020 18:47   CT L-SPINE NO CHARGE  Result Date: 09/01/2020 CLINICAL DATA:  Sciatica and chronic pain to right hip and right knee, progressive. EXAM: CT LUMBAR SPINE WITHOUT CONTRAST TECHNIQUE: Multidetector CT imaging of the lumbar spine was performed without intravenous contrast administration. Multiplanar CT image reconstructions were also generated. COMPARISON:  MRI lumbar spine 02/22/2019. FINDINGS: Segmentation: Inferior-most fully formed intervertebral disc is labeled L5-S1. Alignment: No substantial subluxation. Vertebrae: L2-L5 PLIF. Evidence of osseous fusion across L2-L3, L3-L4, and L4-L5. Vertebral body heights are maintained. No evidence of acute fracture. Paraspinal and other soft tissues: Calcific atherosclerosis of the aorta. Partially imaged, spinal cord stimulator which enters the canal at T11-T12 dorsally. Disc levels: At L2-L3 there is focal right subarticular bony spurring, which may affect the descending right L3 nerve root and was demonstrated on prior MRI. Multilevel facet hypertrophy. Limited evaluation of the canal given absence of contrast and streak artifact from fusion IMPRESSION: 1. No evidence of acute fracture or traumatic malalignment. 2. At L2-L3 there is focal right subarticular bony spurring, which may affect the descending right L3 nerve root and was demonstrated  on prior MRI. 3. Limited evaluation of the canal due to streak artifact from hardware and absence of contrast. An MRI or CT myelogram could further characterize the canal/cord/foramina if clinically indicated. Electronically Signed   By: Margaretha Sheffield MD   On: 09/01/2020 18:45   CT Renal Stone Study  Result Date: 09/01/2020 CLINICAL DATA:  Right flank pain EXAM: CT ABDOMEN AND PELVIS WITHOUT CONTRAST TECHNIQUE: Multidetector CT imaging of the abdomen and pelvis was performed following the standard protocol without IV contrast. COMPARISON:  CT 02/08/2018 FINDINGS: Lower chest: Lung bases demonstrate no acute consolidation or effusion. Bilateral gynecomastia. Coronary vascular calcification. Hepatobiliary: No focal liver abnormality is seen. No gallstones, gallbladder wall thickening, or biliary dilatation. Pancreas: Unremarkable. No pancreatic ductal dilatation or surrounding inflammatory changes. Spleen: Normal in size without focal abnormality. Adrenals/Urinary Tract: Adrenal glands are unremarkable. Kidneys show no hydronephrosis. Negative for ureteral stone. Bladder is unremarkable Stomach/Bowel: Stomach is within normal limits. Appendix appears normal. No evidence of bowel wall thickening, distention, or inflammatory changes. Vascular/Lymphatic: Mild aortic atherosclerosis. No aneurysm. No suspicious nodes Reproductive: Negative for mass. Other: Negative for free air or free fluid. Right greater than left fat containing inguinal hernias. Musculoskeletal: Orthopedic hardware L2 through L5, see separately dictated lumbar spine CT report. Ascending thoracic stimulator leads terminating at T7-T8. IMPRESSION: 1. Negative for hydronephrosis or ureteral stone. 2. No CT evidence for acute intra-abdominal or pelvic abnormality. Aortic Atherosclerosis (ICD10-I70.0). Electronically Signed   By: Donavan Foil M.D.   On: 09/01/2020 19:07   VAS [name] LOWER EXTREMITY VENOUS (DVT) (ONLY [facility] & [facility])  Result Date:  09/02/2020  Lower Venous DVT Study Indications: RLE pain.  Risk Factors: HX of arthritis and sciatica. Comparison Study: Last exam was on 07/16/18 - negative Performing Technologist: Rogelia Rohrer  Examination Guidelines: A complete evaluation includes B-mode imaging, spectral Doppler, color Doppler, and power Doppler as needed of all accessible portions of each vessel. Bilateral testing is considered an integral part of a complete examination. Limited examinations for reoccurring indications may be performed as noted. The reflux portion of the exam is performed with the patient in reverse Trendelenburg.  +---------+---------------+---------+-----------+----------+--------------+ RIGHT    CompressibilityPhasicitySpontaneityPropertiesThrombus Aging +---------+---------------+---------+-----------+----------+--------------+ CFV      Full           Yes  Yes                                 +---------+---------------+---------+-----------+----------+--------------+ SFJ      Full                                                        +---------+---------------+---------+-----------+----------+--------------+ FV Prox  Full           Yes      Yes                                 +---------+---------------+---------+-----------+----------+--------------+ FV Mid   Full           Yes      Yes                                 +---------+---------------+---------+-----------+----------+--------------+ FV DistalFull           Yes      Yes                                 +---------+---------------+---------+-----------+----------+--------------+ PFV      Full                                                        +---------+---------------+---------+-----------+----------+--------------+ POP      Full           Yes      Yes                                 +---------+---------------+---------+-----------+----------+--------------+ PTV      Full                                                         +---------+---------------+---------+-----------+----------+--------------+ PERO     Full                                                        +---------+---------------+---------+-----------+----------+--------------+   +----+---------------+---------+-----------+----------+--------------+ LEFTCompressibilityPhasicitySpontaneityPropertiesThrombus Aging +----+---------------+---------+-----------+----------+--------------+ CFV Full           Yes      Yes                                 +----+---------------+---------+-----------+----------+--------------+     Summary: RIGHT: - There is no evidence of deep vein thrombosis in the lower extremity. - There is no evidence of superficial venous thrombosis.  - No cystic structure found in the popliteal fossa.  LEFT: - No evidence of common femoral vein obstruction.  *See table(s) above for measurements and observations. Electronically signed by Deitra Mayo MD on 09/02/2020 at 5:58:03 AM.    Final     Scheduled Meds: . allopurinol  450 mg Oral Daily  . bupivacaine  10 mL Infiltration Once  . carbidopa-levodopa  2 tablet Oral QID  . DULoxetine  60 mg Oral Daily  . fentaNYL  1 patch Transdermal Q72H  . furosemide  40 mg Oral Daily  . hydrocortisone  10 mg Oral Q supper  . hydrocortisone  20 mg Oral Daily  . levothyroxine  274 mcg Oral Q0600   And  . levothyroxine  137 mcg Oral Once per day on Thu  . lidocaine  2 patch Transdermal Q24H  . methylPREDNISolone acetate  40 mg Intra-articular Once  . oxyCODONE  30 mg Oral Once  . polyethylene glycol  17 g Oral Daily  . rosuvastatin  20 mg Oral Daily  . senna  2 tablet Oral BID  . sodium chloride flush  3 mL Intravenous Q12H  . topiramate  50 mg Oral BID   Continuous Infusions: . sodium chloride       LOS: 1 day   Marylu Lund, MD [facility] Pager On Amion  If 7PM-7AM, please contact night-coverage 09/03/2020, 4:56 PM

## 2020-09-03 NOTE — Plan of Care (Signed)
  Problem: Health Behavior/Discharge Planning: Goal: Ability to manage health-related needs will improve Outcome: Progressing   

## 2020-09-04 ENCOUNTER — Inpatient Hospital Stay (HOSPITAL_COMMUNITY): Payer: No Typology Code available for payment source

## 2020-09-04 DIAGNOSIS — E2740 Unspecified adrenocortical insufficiency: Secondary | ICD-10-CM | POA: Diagnosis not present

## 2020-09-04 DIAGNOSIS — M5441 Lumbago with sciatica, right side: Secondary | ICD-10-CM | POA: Diagnosis not present

## 2020-09-04 DIAGNOSIS — M79604 Pain in right leg: Secondary | ICD-10-CM | POA: Diagnosis not present

## 2020-09-04 DIAGNOSIS — M5442 Lumbago with sciatica, left side: Secondary | ICD-10-CM

## 2020-09-04 DIAGNOSIS — Z515 Encounter for palliative care: Secondary | ICD-10-CM | POA: Diagnosis not present

## 2020-09-04 NOTE — Progress Notes (Signed)
Palliative Medicine Inpatient Follow Up Note  HPI: 75 y.o.malewith past medical history of diastolic heart failure, hypertension, paroxysmal atrial fibrillation on anticoagulation, OSA on CPAP, hypogonadotrophic hypogonadism, Parkinson's disease (wheelchair bound), Addison's disease, h/o testicular and prostate cancer, gout, chronic right knee pain, chronic pain syndromeadmitted on 2/22/2022with worsening right knee pain.Followed by outpatient palliative care with [facility] last visit documented 08/20/20 where he is documented to be DNR and nearing hospice eligibility and that patient and family open to hospice support at home. Orthopedics to see for knee pain.  Today's Discussion (09/04/2020):  *Please note that this is a verbal dictation therefore any spelling or grammatical errors are due to the "[place] One" system interpretation.  Chart reviewed. Highest pain score in the past 24 hours was 5/10. Received last dose of OxyContin ER overnight. Received x1 PRN dose of Oxycodone IR yesterday.  I met with Dennis [name] this afternoon. He shares with me that he had just mobilized with physical therapy. We reviewed his present right knee and back pain. He endorses stability of both.   Dennis [name] shares with me his agent orange exposure and briefly the effects of this ion his health and overall life.   He shares with me optimism today. I told him that I would see him tomorrow though I am glad his pain is under better control.  Questions and concerns addressed   Objective Assessment: Vital Signs Vitals:   09/03/20 2138 09/04/20 0427  BP: (!) 141/78 125/87  Pulse: 66 66  Resp: 17 16  Temp: 97.9 F (36.6 C) (!) 97.5 F (36.4 C)  SpO2: 99% 100%    Intake/Output Summary (Last 24 hours) at 09/04/2020 1238 Last data filed at 09/04/2020 0900 Gross per 24 hour  Intake 730 ml  Output 1000 ml  Net -270 ml   Last Weight  Most recent update: 09/02/2020  2:51 AM   Weight  160 kg (352 lb  11.8 oz)             Gen:  Older Caucasian M in NAD HEENT: moist mucous membranes CV: Regular rate and rhythm, no murmurs rubs or gallops PULM: On RA ABD: soft/nontender  EXT: (+) pedal edema Neuro: Alert and oriented x4  SUMMARY OF RECOMMENDATIONS   DNAR/DNI  Symptoms:  Chronic pain from low back and right knee (difficult to differentiate symptoms from back vs knee): ? Spinal stimulator in place.  ? S/P cortisone injection to R knee on 2/23 with oral taper ? Previously no relief from gabapentin or Tylenol but this could give him some relief from his acute right knee pain even if not for his overall chronic pain. May consider adding in near future.  ? Fentanyl patch 50 mcg/hr 09/03/20 ? Oxycontin stopped ? Oxycodone-acetaminophen 5-325 mg every 1-2 Tabs PO 4 hours  ? Continue lidoderm patches to low back and right knee.  ? Continue flexeril TID as needed. Consider scheduled dosing. ? Continue Cymbalta.  ? Continue flexeril ? Consider consulting Dr. Sherley Bounds as wife mentions that he has previously mentioned was seems like potentially nerve ablation. ? Ongoing PT/OT support  ? Gout management per primary team  Bowel Regimen:  ? LBM 09/03/20 ? Miralax 17g Daily ? senokot to 2 tabs twice daily  Time Spent: 25 Greater than 50% of the time was spent in counseling and coordination of care ______________________________________________________________________________________ [place] Team Team Cell Phone: [phone] Please utilize secure chat with additional questions, if there is no response within 30  minutes please call the above phone number  Palliative Medicine Team providers are available by phone from 7am to 7pm daily and can be reached through the team cell phone.  Should this patient require assistance outside of these hours, please call the patient's attending physician.

## 2020-09-04 NOTE — Progress Notes (Signed)
PROGRESS NOTE    Dennis Perleberg Sr.  ZHG:[id number] DOB: 04/08/1946 DOA: 09/01/2020 PCP: [name], MD    Brief Narrative:  75 y.o. male with medical history significant of chronic diastolic CHF, HTN, hypogonadotrophic hypogonadism, Parkinson disease, paroxysmal atrial fibrillation on anticoagulation, hypothyroidism, gout, chronic right knee pain, chronic pain syndrome Pt presented with   worsening right knee pain.  Initially was seen on 19 February presented to emergency department with severe right knee pain unable to ambulate at all. Limited range of motion passively and actively. Knee tap was done at the time showed inflammatory changes Cultures still no growth.  Assessment & Plan:   Active Problems:   CAD (coronary artery disease)   Chronic diastolic CHF (congestive heart failure) (HCC)   Acquired hypothyroidism   Hypercholesterolemia   Morbid obesity (HCC)   Essential hypertension   Adrenal insufficiency (HCC)   Primary parkinsonism (HCC)   Paroxysmal atrial fibrillation ([place])   Hypopituitarism due to empty sella syndrome ([place])   Right knee pain   Present on Admission: . Right knee pain  -Non-infectious and no crystals seen on fluid analysis -Orthopedic following. Pt has known hx of osteoarthritic changes -Plan today for re-tap with injection of knee -PT/OT consulted with recs for SNF -This AM when seen with Palliative Care at bedside, pt was completely immobile and not tolerating even passive movement of RLE, secondary to marked and uncontrolled pain. -Appreciate assistance by Palliative Care regarding changes to long-acting analgesia. Seen with PT this AM. Encourage continued therapy  . Primary parkinsonism ([place])  -chronic stable continue home medication -Therapy recs for SNF noted   . Paroxysmal atrial fibrillation (HCC)  -Anticoagulation was placed on hold anticoagulation at time of presentation in anticipation of knee aspiration -Per Orthopedic  Surgery, OK to resume anticoagulation, will resume  . Morbid obesity ([place])  -Recommend diet/lifestyle modification  . Hypopituitarism due to empty sella syndrome ([place]) resume home medications  . Hypercholesterolemia -chronic stable continue crest  . Essential hypertension  -BP remains stable and controlled at this time  . Chronic diastolic CHF (congestive heart failure) (HCC) continue Lasix and -Currently appears euvolemic  . CAD (coronary artery disease) -chronic continue statin currently no chest pain Does not tolerate beta-blocker  . Adrenal insufficiency ([place]) continue Cortef  . Acquired hypothyroidism continue home medication check TSH  History of chronic pain continue home medications as tolerated and cont to titrate to pt comfort -Appreciate Palliative Care input regarding analgesia recs above  Transient hypoxia -currently on room air no monitor for any evidence of hypoxia with pain medication administration.   DVT prophylaxis: SCD's Code Status: DNR Family Communication: Pt in room, family at bedside  Status is: Inpatient  Requires inpatient stay because: Ongoing active pain requiring inpatient pain management and Unsafe d/c plan  Dispo: The patient is from: Home              Anticipated d/c is to: SNF              Anticipated d/c date is: 2 days              Patient currently is not medically stable to d/c.   Difficult to place patient No       Consultants:   Orthopedic Surgery  Palliative Care  Procedures:     Antimicrobials: Anti-infectives (From admission, onward)   None      Subjective: Remains with R knee/LE pain  Objective: Vitals:   09/03/20 0547 09/03/20 1242 09/03/20  2138 09/04/20 0427  BP: 137/88 115/79 (!) 141/78 125/87  Pulse: 75 71 66 66  Resp: 17 18 17 16   Temp: 97.7 F (36.5 C) 98.1 F (36.7 C) 97.9 F (36.6 C) (!) 97.5 F (36.4 C)  TempSrc: Oral Oral    SpO2: 98% 98% 99% 100%  Weight:      Height:         Intake/Output Summary (Last 24 hours) at 09/04/2020 1445 Last data filed at 09/04/2020 0900 Gross per 24 hour  Intake 480 ml  Output 1000 ml  Net -520 ml   Filed Weights   09/02/20 0250  Weight: (!) 160 kg    Examination: General exam: Conversant, in no acute distress Respiratory system: normal chest rise, clear, no audible wheezing Cardiovascular system: regular rhythm, s1-s2 Gastrointestinal system: Nondistended, nontender, pos BS Central nervous system: No seizures, no tremors Extremities: No cyanosis, no joint deformities, RLE pain Skin: No rashes, no pallor Psychiatry: Affect normal // no auditory hallucinations   Data Reviewed: I have personally reviewed following labs and imaging studies  CBC: Recent Labs  Lab 08/29/20 1440 09/01/20 1352 09/02/20 0137  WBC 6.9 9.8 9.8  NEUTROABS 5.6  --  8.6*  HGB 16.0 15.6 14.1  HCT 49.0 48.0 44.4  MCV 83.6 85.0 84.3  PLT 132* 203 161   Basic Metabolic Panel: Recent Labs  Lab 08/29/20 1440 09/01/20 1352 09/02/20 0137  NA 133* 132* 131*  K 4.1 4.2 4.1  CL 94* 96* 96*  CO2 28 25 27   GLUCOSE 120* 142* 115*  BUN 19 23 21   CREATININE 1.12 1.17 0.98  CALCIUM 9.7 9.5 9.3  MG  --   --  2.1  PHOS  --   --  3.0   GFR: Estimated Creatinine Clearance: 109.9 mL/min (by C-G formula based on SCr of 0.98 mg/dL). Liver Function Tests: Recent Labs  Lab 09/02/20 0137  AST 13*  ALT 5  ALKPHOS 47  BILITOT 1.2  PROT 6.8  ALBUMIN 3.4*   No results for input(s): LIPASE, AMYLASE in the last 168 hours. No results for input(s): AMMONIA in the last 168 hours. Coagulation Profile: No results for input(s): INR, PROTIME in the last 168 hours. Cardiac Enzymes: Recent Labs  Lab 09/01/20 2047  CKTOTAL 22*   BNP (last 3 results) No results for input(s): PROBNP in the last 8760 hours. HbA1C: No results for input(s): HGBA1C in the last 72 hours. CBG: No results for input(s): GLUCAP in the last 168 hours. Lipid Profile: No  results for input(s): CHOL, HDL, LDLCALC, TRIG, CHOLHDL, LDLDIRECT in the last 72 hours. Thyroid Function Tests: Recent Labs    09/02/20 0137  TSH 1.778   Anemia Panel: No results for input(s): VITAMINB12, FOLATE, FERRITIN, TIBC, IRON, RETICCTPCT in the last 72 hours. Sepsis Labs: Recent Labs  Lab 09/01/20 2120 09/02/20 0137  LATICACIDVEN 0.9 0.9    Recent Results (from the past 240 hour(s))  Gram stain     Status: None   Collection Time: 08/29/20  7:32 PM   Specimen: KNEE; Synovial Fluid  Result Value Ref Range Status   Specimen Description KNEE  Final   Special Requests NONE  Final   Gram Stain   Final    WBC SEEN FEW NO ORGANISMS SEEN Performed at [facility] [address] [place], [place] [address]    Report Status 08/29/2020 FINAL  Final  Body fluid culture w Gram Stain     Status: None   Collection  Time: 08/29/20  7:32 PM   Specimen: KNEE; Synovial Fluid  Result Value Ref Range Status   Specimen Description   Final    KNEE Performed at [facility], [place] [address] [place], [place] [address]    Special Requests   Final    NONE Performed at [facility], [place] [address], [place], [place] [address]    Gram Stain   Final    FEW WBC PRESENT, PREDOMINANTLY PMN NO ORGANISMS SEEN    Culture   Final    NO GROWTH 3 DAYS Performed at [facility] [address], , [place] [address]    Report Status 09/02/2020 FINAL  Final  SARS CORONAVIRUS 2 (TAT 6-24 HRS) Nasopharyngeal Nasopharyngeal Swab     Status: None   Collection Time: 09/01/20  5:18 PM   Specimen: Nasopharyngeal Swab  Result Value Ref Range Status   SARS Coronavirus 2 NEGATIVE NEGATIVE Final    Comment: (NOTE) SARS-CoV-2 target nucleic acids are NOT DETECTED.  The SARS-CoV-2 RNA is generally detectable in upper and lower respiratory specimens during the acute phase of infection. Negative results do not preclude SARS-CoV-2  infection, do not rule out co-infections with other pathogens, and should not be used as the sole basis for treatment or other patient management decisions. Negative results must be combined with clinical observations, patient history, and epidemiological information. The expected result is Negative.  Fact Sheet for Patients: SugarRoll.be  Fact Sheet for Healthcare Providers: https://www.woods-mathews.com/  This test is not yet approved or cleared by the [place] FDA and  has been authorized for detection and/or diagnosis of SARS-CoV-2 by FDA under an Emergency Use Authorization (EUA). This EUA will remain  in effect (meaning this test can be used) for the duration of the COVID-19 declaration under Se ction 564(b)(1) of the Act, 21 U.S.C. section 360bbb-3(b)(1), unless the authorization is terminated or revoked sooner.  Performed at [facility], [place] [address], [place], [place] [address]   Body fluid culture w Gram Stain     Status: None (Preliminary result)   Collection Time: 09/02/20  1:23 PM   Specimen: Body Fluid  Result Value Ref Range Status   Specimen Description FLUID KNEE RIGHT  Final   Special Requests SYNOVIAL FLUID RIGHT KNEE  Final   Gram Stain   Final    ABUNDANT WBC PRESENT, PREDOMINANTLY PMN NO ORGANISMS SEEN    Culture   Final    NO GROWTH 2 DAYS Performed at [facility], [address], [place],  [address]    Report Status PENDING  Incomplete     Radiology Studies: No results found.  Scheduled Meds: . allopurinol  450 mg Oral Daily  . apixaban  5 mg Oral BID  . bupivacaine  10 mL Infiltration Once  . carbidopa-levodopa  2 tablet Oral QID  . DULoxetine  60 mg Oral Daily  . fentaNYL  1 patch Transdermal Q72H  . furosemide  40 mg Oral Daily  . hydrocortisone  10 mg Oral Q supper  . hydrocortisone  20 mg Oral Daily  . levothyroxine  274 mcg Oral Q0600   And  . levothyroxine  137 mcg  Oral Once per day on Thu  . lidocaine  2 patch Transdermal Q24H  . methylPREDNISolone acetate  40 mg Intra-articular Once  . polyethylene glycol  17 g Oral Daily  . rosuvastatin  20 mg Oral Daily  . senna  2 tablet Oral  BID  . sodium chloride flush  3 mL Intravenous Q12H  . topiramate  50 mg Oral BID   Continuous Infusions: . sodium chloride       LOS: 2 days   Marylu Lund, MD [facility] Pager On Amion  If 7PM-7AM, please contact night-coverage 09/04/2020, 2:45 PM

## 2020-09-04 NOTE — TOC Progression Note (Signed)
Transition of Care (TOC) - Progression Note    Patient Details  Name: Dennis Rossa Jeschke Sr. MRN: [id number] Date of Birth: [birth year]  Transition of Care [facility]) CM/SW [place], [place] Phone Number: 09/04/2020, 3:22 PM  Clinical Narrative:     CSW spoke to [place] with the [place]. Pt is not VA service connected and will have to use his medicare for SNF. Since [facility] declined, pts wife would like for him to go to a facility in [place] since it is closer to their home. Wife requested [facility] but they are closed at this time. CSW is waiting to hear back from other facilities.   CSW is waiting call back from [employer].   Expected Discharge Plan: [place] Barriers to Discharge: Continued Medical Work up  Expected Discharge Plan and Services Expected Discharge Plan: [place] arrangements for the past 2 months: [place] Determinants of Health (SDOH) Interventions    Readmission Risk Interventions Readmission Risk Prevention Plan 02/24/2019  Transportation Screening Complete  PCP or Specialist Appt within 5-7 Days Complete  Home Care Screening Complete  Medication Review (RN CM) Complete  Some recent data might be hidden   Dennis [name], Dennis [name], [place] Social Worker [phone]

## 2020-09-04 NOTE — Progress Notes (Signed)
     Subjective:   Patient is still frustrated with severe pain but does feel like it is improving. Complaining of new ankle pain. States its mainly medial, worse with movement. Feels like his movement is improving.   Objective:  PE: VITALS:   Vitals:   09/03/20 0547 09/03/20 1242 09/03/20 2138 09/04/20 0427  BP: 137/88 115/79 (!) 141/78 125/87  Pulse: 75 71 66 66  Resp: 17 18 17 16   Temp: 97.7 F (36.5 C) 98.1 F (36.7 C) 97.9 F (36.6 C) (!) 97.5 F (36.4 C)  TempSrc: Oral Oral    SpO2: 98% 98% 99% 100%  Weight:      Height:        General: Sitting up in bed, in no acute distress MSK: Mild right knee effusion. Able to perform a straight leg raise. Approx 10-50 deg of AROM of knee. Dorsiflexion and Plantarflexion intact. Distal sensation intact. + DP pulse. TTP to palpation to posterior right knee. Mildly tender to medial malleolus.   LABS  No results found for this or any previous visit (from the past 24 hour(s)).  No results found.  Assessment/Plan: Acute flare of right knee osteoarthritis: - patient has known osteoarthritis in his right knee and has had multiple arthritic flares requiring a cortisone injection, performed cortisone injection 2/23 - making steady improvement with pain and ROM since cortisone injection  - right knee aspiration 2/19 show no crystals, cultures continue to be negative for any organisms - repeat aspiration performed 2/23 show no crystals, cultures continue to be negative for any organisms - ok to WBAT RLE, up with PT as needed - no immediate need for surgical intervention, ok to resume anticoagulation  Dispo: PT recommending SNF. TOC on board.   Contact information:   Weekdays 8-5 [address], [place] [phone] A fter hours and holidays please check [email or web address] for group call information for [facility]  Ventura Bruns 09/04/2020, 1:11 PM

## 2020-09-04 NOTE — Progress Notes (Signed)
This chaplain responded to PMT consult for spiritual care.  The Pt. Wife-Dennis [name] is at the Pt. Bedside.  The Pt. shares he is experiencing less pain today.    The Pt. and Dennis [name] freely share their appreciation of the team approach and clear communication in the Pt. Healthcare.  The chaplain understands the family's story is embedded with spirituality and tenacity. Dennis [name] shares advocacy and hopefulness for the Pt. and other veterans is a daily journey.    The family accepted the chaplain's invitation for prayer and F/U spiritual care.

## 2020-09-04 NOTE — Progress Notes (Signed)
Physical Therapy Treatment Patient Details Name: Dennis Riches Hubka Sr. MRN: [id number] DOB: [birth year] Today's Date: 09/04/2020    History of Present Illness Pt is a 75 year old man admitted on 09/01/20 with severe R knee pain and inability to ambulate. Underwent aspiration of knee on 09/02/20. PMH: Parkinsons Disease, CHF, Addison's Disease, PAF, gout, back surgery.    PT Comments    Patient progressing slowly towards PT goals. Tolerated bed mobility with Min guard assist, heavy use of rail and increased time to get to EOB. Requires Mod A of 2 to stand from EOB with cues for right foot placement, hand placement and use of momentum. Increased WB through [name]. Able to side step along side bed with Mod A of 2 for weight shifting, RW management and safety. Limited mainly by pain however pt pushing through. Reports pain is mostly radiating from hip into mid knee and down into foot. Continues to be appropriate for SNF. Will follow.   Follow Up Recommendations  SNF     Equipment Recommendations  None recommended by PT    Recommendations for Other Services       Precautions / Restrictions Precautions Precautions: Fall Restrictions Weight Bearing Restrictions: No    Mobility  Bed Mobility Overal bed mobility: Needs Assistance Bed Mobility: Supine to Sit;Sit to Supine     Supine to sit: Min guard;HOB elevated Sit to supine: Min guard;HOB elevated   General bed mobility comments: Increased time, heavy use of rail, increased pain but no assist needed.    Transfers Overall transfer level: Needs assistance Equipment used: Rolling walker (2 wheeled) Transfers: Sit to/from Stand Sit to Stand: +2 physical assistance;Mod assist;From elevated surface;Max assist         General transfer comment: Initially requires Max A of 2 and unable to clear bottom, performed again with pt pushing through end of bed rail to stand and able to stand with Mod A of 2 and elevated bed height. Flexed trunk  and cues for WB through BUEs.  Able to scoot along side bed with Min guard assist, limited right knee flexion AROM.  Ambulation/Gait Ambulation/Gait assistance: Mod assist;+2 safety/equipment Gait Distance (Feet): 6 Feet Assistive device: Rolling walker (2 wheeled) Gait Pattern/deviations: Trunk flexed;Wide base of support;Decreased weight shift to right Gait velocity: decreased   General Gait Details: Able to side step along side bed with Assist for RW management, balance and weight shifting. Limited by pain, Cues for breathing.   Stairs             Wheelchair Mobility    Modified Rankin (Stroke Patients Only)       Balance Overall balance assessment: Needs assistance Sitting-balance support: Feet supported;No upper extremity supported Sitting balance-Leahy Scale: Fair Sitting balance - Comments: supervision for safety. Worked on gaining knee flexion AROM sitting EOB.   Standing balance support: During functional activity Standing balance-Leahy Scale: Poor Standing balance comment: heavily reliant on UEs and Mod A for standing balance when trying to use urinal due to left lateral lean.                            Cognition Arousal/Alertness: Awake/alert Behavior During Therapy: WFL for tasks assessed/performed Overall Cognitive Status: Impaired/Different from baseline Area of Impairment: Memory;Problem solving                     Memory: Decreased short-term memory       Problem Solving: Slow processing;Decreased  initiation;Difficulty sequencing;Requires verbal cues General Comments: Per wife, (on phone during session), pt is confused and sort of out of it today.      Exercises      General Comments General comments (skin integrity, edema, etc.): Wife on the phone during session.      Pertinent Vitals/Pain Pain Assessment: Faces Faces Pain Scale: Hurts worst Pain Location: Rt LE with movement, knee flexion Pain Descriptors /  Indicators: Aching;Grimacing;Guarding;Discomfort;Tender Pain Intervention(s): Monitored during session;Repositioned;Limited activity within patient's tolerance;Patient requesting pain meds-RN notified    Home Living                      Prior Function            PT Goals (current goals can now be found in the care plan section) Progress towards PT goals: Progressing toward goals (slowly)    Frequency    Min 2X/week      PT Plan Current plan remains appropriate    Co-evaluation              AM-PAC PT "6 Clicks" Mobility   Outcome Measure  Help needed turning from your back to your side while in a flat bed without using bedrails?: A Little Help needed moving from lying on your back to sitting on the side of a flat bed without using bedrails?: A Little Help needed moving to and from a bed to a chair (including a wheelchair)?: A Lot Help needed standing up from a chair using your arms (e.g., wheelchair or bedside chair)?: A Lot Help needed to walk in hospital room?: A Lot Help needed climbing 3-5 steps with a railing? : Total 6 Click Score: 13    End of Session Equipment Utilized During Treatment: Gait belt Activity Tolerance: Patient limited by pain Patient left: in bed;with call bell/phone within reach;with bed alarm set Nurse Communication: Mobility status PT Visit Diagnosis: Unsteadiness on feet (R26.81);Muscle weakness (generalized) (M62.81);Other abnormalities of gait and mobility (R26.89)     Time: 1610-9604 PT Time Calculation (min) (ACUTE ONLY): 30 min  Charges:  $Therapeutic Activity: 23-37 mins                     Dennis [name], PT, DPT Acute Rehabilitation Services Pager [phone] Office [phone]       Dennis [name] 09/04/2020, 12:31 PM

## 2020-09-05 DIAGNOSIS — E2740 Unspecified adrenocortical insufficiency: Secondary | ICD-10-CM | POA: Diagnosis not present

## 2020-09-05 DIAGNOSIS — Z515 Encounter for palliative care: Secondary | ICD-10-CM | POA: Diagnosis not present

## 2020-09-05 DIAGNOSIS — Z66 Do not resuscitate: Secondary | ICD-10-CM

## 2020-09-05 DIAGNOSIS — K5903 Drug induced constipation: Secondary | ICD-10-CM | POA: Diagnosis not present

## 2020-09-05 DIAGNOSIS — I2510 Atherosclerotic heart disease of native coronary artery without angina pectoris: Secondary | ICD-10-CM | POA: Diagnosis not present

## 2020-09-05 DIAGNOSIS — Z7189 Other specified counseling: Secondary | ICD-10-CM | POA: Diagnosis not present

## 2020-09-05 DIAGNOSIS — I10 Essential (primary) hypertension: Secondary | ICD-10-CM | POA: Diagnosis not present

## 2020-09-05 LAB — BODY FLUID CULTURE W GRAM STAIN: Culture: NO GROWTH

## 2020-09-05 MED ORDER — BISACODYL 5 MG PO TBEC
10.0000 mg | DELAYED_RELEASE_TABLET | Freq: Once | ORAL | Status: AC
  Administered 2020-09-05: 10 mg via ORAL
  Filled 2020-09-05: qty 2

## 2020-09-05 NOTE — Progress Notes (Signed)
PROGRESS NOTE    Dennis Nanna Sr.  DJM:[id number] DOB: 08-29-1945 DOA: 09/01/2020 PCP: [name], MD    Brief Narrative:  75 y.o. male with medical history significant of chronic diastolic CHF, HTN, hypogonadotrophic hypogonadism, Parkinson disease, paroxysmal atrial fibrillation on anticoagulation, hypothyroidism, gout, chronic right knee pain, chronic pain syndrome Pt presented with   worsening right knee pain.  Initially was seen on 19 February presented to emergency department with severe right knee pain unable to ambulate at all. Limited range of motion passively and actively. Knee tap was done at the time showed inflammatory changes Cultures still no growth.  Assessment & Plan:   Active Problems:   CAD (coronary artery disease)   Chronic diastolic CHF (congestive heart failure) (HCC)   Acquired hypothyroidism   Hypercholesterolemia   Morbid obesity (HCC)   Essential hypertension   Adrenal insufficiency (HCC)   Primary parkinsonism (HCC)   Paroxysmal atrial fibrillation ([place])   Hypopituitarism due to empty sella syndrome ([place])   Right knee pain   Present on Admission: . Right knee pain  -Non-infectious and no crystals seen on fluid analysis -Orthopedic following. Pt has known hx of osteoarthritic changes -Plan today for re-tap with injection of knee -PT/OT consulted with recs for SNF -This AM when seen with Palliative Care at bedside, pt was completely immobile and not tolerating even passive movement of RLE, secondary to marked and uncontrolled pain. -Appreciate assistance by Palliative Care regarding changes to long-acting analgesia. Continue with PT as tolerated  . Primary parkinsonism ([place])  -chronic stable continue home medication -Therapy recs for SNF noted   . Paroxysmal atrial fibrillation (HCC)  -Anticoagulation was placed on hold anticoagulation at time of presentation in anticipation of knee aspiration -Per Orthopedic Surgery, OK to resume  anticoagulation, tolerating eliquis  . Morbid obesity ([place])  -Recommend diet/lifestyle modification  . Hypopituitarism due to empty sella syndrome ([place]) continue home medications  . Hypercholesterolemia -chronic stable continue crest  . Essential hypertension  -BP remains stable and controlled at this time  . Chronic diastolic CHF (congestive heart failure) (HCC) continue Lasix and -Currently appears euvolemic  . CAD (coronary artery disease) -chronic continue statin currently no chest pain Does not tolerate beta-blocker  . Adrenal insufficiency ([place]) continue Cortef  . Acquired hypothyroidism continue home medication check TSH  History of chronic pain continue home medications as tolerated and cont to titrate to pt comfort -Appreciate Palliative Care input regarding analgesia recs above  Transient hypoxia -currently on room air, stable   DVT prophylaxis: SCD's Code Status: DNR Family Communication: Pt in room, family currently not at bedside  Status is: Inpatient  Requires inpatient stay because: Ongoing active pain requiring inpatient pain management and Unsafe d/c plan  Dispo: The patient is from: Home              Anticipated d/c is to: SNF              Anticipated d/c date is: 2 days              Patient currently is not medically stable to d/c.   Difficult to place patient No       Consultants:   Orthopedic Surgery  Palliative Care  Procedures:     Antimicrobials: Anti-infectives (From admission, onward)   None      Subjective: Still with RLE pain, in good spirits this AM  Objective: Vitals:   09/04/20 2133 09/05/20 0533 09/05/20 0907 09/05/20 1357  BP: 124/75 Marland Kitchen)  130/93  117/74  Pulse: 67 67  80  Resp: 16 17 18 18   Temp: 98.1 F (36.7 C) 97.9 F (36.6 C)  98.9 F (37.2 C)  TempSrc: Oral Oral  Oral  SpO2: 96% 98%  97%  Weight:      Height:        Intake/Output Summary (Last 24 hours) at 09/05/2020 1448 Last data filed at  09/05/2020 1057 Gross per 24 hour  Intake 597 ml  Output 1800 ml  Net -1203 ml   Filed Weights   09/02/20 0250  Weight: (!) 160 kg    Examination: General exam: Awake, laying in bed, in nad Respiratory system: Normal respiratory effort, no wheezing Cardiovascular system: regular rate, s1, s2 Gastrointestinal system: Soft, nondistended, positive BS Central nervous system: CN2-12 grossly intact, strength intact Extremities: Perfused, no clubbing Skin: Normal skin turgor, no notable skin lesions seen Psychiatry: Mood normal // no visual hallucinations   Data Reviewed: I have personally reviewed following labs and imaging studies  CBC: Recent Labs  Lab 09/01/20 1352 09/02/20 0137  WBC 9.8 9.8  NEUTROABS  --  8.6*  HGB 15.6 14.1  HCT 48.0 44.4  MCV 85.0 84.3  PLT 203 825   Basic Metabolic Panel: Recent Labs  Lab 09/01/20 1352 09/02/20 0137  NA 132* 131*  K 4.2 4.1  CL 96* 96*  CO2 25 27  GLUCOSE 142* 115*  BUN 23 21  CREATININE 1.17 0.98  CALCIUM 9.5 9.3  MG  --  2.1  PHOS  --  3.0   GFR: Estimated Creatinine Clearance: 109.9 mL/min (by C-G formula based on SCr of 0.98 mg/dL). Liver Function Tests: Recent Labs  Lab 09/02/20 0137  AST 13*  ALT 5  ALKPHOS 47  BILITOT 1.2  PROT 6.8  ALBUMIN 3.4*   No results for input(s): LIPASE, AMYLASE in the last 168 hours. No results for input(s): AMMONIA in the last 168 hours. Coagulation Profile: No results for input(s): INR, PROTIME in the last 168 hours. Cardiac Enzymes: Recent Labs  Lab 09/01/20 2047  CKTOTAL 22*   BNP (last 3 results) No results for input(s): PROBNP in the last 8760 hours. HbA1C: No results for input(s): HGBA1C in the last 72 hours. CBG: No results for input(s): GLUCAP in the last 168 hours. Lipid Profile: No results for input(s): CHOL, HDL, LDLCALC, TRIG, CHOLHDL, LDLDIRECT in the last 72 hours. Thyroid Function Tests: No results for input(s): TSH, T4TOTAL, FREET4, T3FREE, THYROIDAB  in the last 72 hours. Anemia Panel: No results for input(s): VITAMINB12, FOLATE, FERRITIN, TIBC, IRON, RETICCTPCT in the last 72 hours. Sepsis Labs: Recent Labs  Lab 09/01/20 2120 09/02/20 0137  LATICACIDVEN 0.9 0.9    Recent Results (from the past 240 hour(s))  Gram stain     Status: None   Collection Time: 08/29/20  7:32 PM   Specimen: KNEE; Synovial Fluid  Result Value Ref Range Status   Specimen Description KNEE  Final   Special Requests NONE  Final   Gram Stain   Final    WBC SEEN FEW NO ORGANISMS SEEN Performed at [facility] [address] [place], [place] [address]    Report Status 08/29/2020 FINAL  Final  Body fluid culture w Gram Stain     Status: None   Collection Time: 08/29/20  7:32 PM   Specimen: KNEE; Synovial Fluid  Result Value Ref Range Status   Specimen Description   Final    KNEE Performed at [facility], [place] [address] [place], [place] [address]    Special Requests   Final    NONE Performed at [facility], [place] [address] [place], [place] [address]    Gram Stain   Final    FEW WBC PRESENT, PREDOMINANTLY PMN NO ORGANISMS SEEN    Culture   Final    NO GROWTH 3 DAYS Performed at [facility] [address] [place], [place] [address]    Report Status 09/02/2020 FINAL  Final  SARS CORONAVIRUS 2 (TAT 6-24 HRS) Nasopharyngeal Nasopharyngeal Swab     Status: None   Collection Time: 09/01/20  5:18 PM   Specimen: Nasopharyngeal Swab  Result Value Ref Range Status   SARS Coronavirus 2 NEGATIVE NEGATIVE Final    Comment: (NOTE) SARS-CoV-2 target nucleic acids are NOT DETECTED.  The SARS-CoV-2 RNA is generally detectable in upper and lower respiratory specimens during the acute phase of infection. Negative results do not preclude SARS-CoV-2 infection, do not rule out co-infections with other pathogens, and should not be used as the sole basis for treatment or other patient  management decisions. Negative results must be combined with clinical observations, patient history, and epidemiological information. The expected result is Negative.  Fact Sheet for Patients: SugarRoll.be  Fact Sheet for Healthcare Providers: https://www.woods-mathews.com/  This test is not yet approved or cleared by the [place] FDA and  has been authorized for detection and/or diagnosis of SARS-CoV-2 by FDA under an Emergency Use Authorization (EUA). This EUA will remain  in effect (meaning this test can be used) for the duration of the COVID-19 declaration under Se ction 564(b)(1) of the Act, 21 U.S.C. section 360bbb-3(b)(1), unless the authorization is terminated or revoked sooner.  Performed at [facility], [place] [address] [place],  [address]   Body fluid culture w Gram Stain     Status: None (Preliminary result)   Collection Time: 09/02/20  1:23 PM   Specimen: Body Fluid  Result Value Ref Range Status   Specimen Description FLUID KNEE RIGHT  Final   Special Requests SYNOVIAL FLUID RIGHT KNEE  Final   Gram Stain   Final    ABUNDANT WBC PRESENT, PREDOMINANTLY PMN NO ORGANISMS SEEN    Culture   Final    NO GROWTH 3 DAYS Performed at [facility], [place] [address] [place],  [address]    Report Status PENDING  Incomplete     Radiology Studies: DG Ankle 2 Views Right  Result Date: 09/04/2020 CLINICAL DATA:  Pain in the LATERAL side of the ankle. No known injury. EXAM: RIGHT ANKLE - 2 VIEW COMPARISON:  None. FINDINGS: Mild degenerative changes are identified at the MEDIAL and LATERAL malleolus. No acute fracture or subluxation. Small plantar calcaneal spur. IMPRESSION: No evidence for acute abnormality. Electronically Signed   By: Nolon Nations M.D.   On: 09/04/2020 16:43    Scheduled Meds: . allopurinol  450 mg Oral Daily  . apixaban  5 mg Oral BID  . bupivacaine  10 mL Infiltration Once  .  carbidopa-levodopa  2 tablet Oral QID  . DULoxetine  60 mg Oral Daily  . fentaNYL  1 patch Transdermal Q72H  . furosemide  40 mg Oral Daily  . hydrocortisone  10 mg Oral Q supper  . hydrocortisone  20 mg Oral Daily  . levothyroxine  274 mcg Oral Q0600   And  . levothyroxine  137 mcg Oral Once per day on Thu  .  lidocaine  2 patch Transdermal Q24H  . methylPREDNISolone acetate  40 mg Intra-articular Once  . polyethylene glycol  17 g Oral Daily  . rosuvastatin  20 mg Oral Daily  . senna  2 tablet Oral BID  . sodium chloride flush  3 mL Intravenous Q12H  . topiramate  50 mg Oral BID   Continuous Infusions: . sodium chloride       LOS: 3 days   Marylu Lund, MD [facility] Pager On Amion  If 7PM-7AM, please contact night-coverage 09/05/2020, 2:48 PM

## 2020-09-05 NOTE — Progress Notes (Signed)
Palliative Medicine Inpatient Follow Up Note  HPI: 75 y.o.malewith past medical history of diastolic heart failure, hypertension, paroxysmal atrial fibrillation on anticoagulation, OSA on CPAP, hypogonadotrophic hypogonadism, Parkinson's disease (wheelchair bound), Addison's disease, h/o testicular and prostate cancer, gout, chronic right knee pain, chronic pain syndromeadmitted on 2/22/2022with worsening right knee pain.Followed by outpatient palliative care with [facility] last visit documented 08/20/20 where he is documented to be DNR and nearing hospice eligibility and that patient and family open to hospice support at home. Orthopedics to see for knee pain.  Today's Discussion (09/05/2020):  *Please note that this is a verbal dictation therefore any spelling or grammatical errors are due to the "[place] One" system interpretation.  Chart reviewed. Highest pain score in the past 24 hours was 4/10. Received last dose of eceived x2 PRN dose of Oxycodone IR in the last 24 hrs.   I met with Dennis [name] this afternoon. He shares with me that he overall feels fair today. He does endorse some right ankle pain. He does feel that the fentanyl patch has been helpful for him. We briefly reviewed the orthopaedic plan for him. In regards to troubling symptoms Rhyse shares that he has not had a bowel movement since 2/24 and he normally goes more regularly. We discussed adding a stimulant laxative to help this.  I shared with Emiliano that I had not seen in his chart if he had ever completed a MOST form. He shares that he had not therefore I provided one for review and completion. We completed this with one another as below:  Cardiopulmonary Resuscitation: Do Not Attempt Resuscitation (DNR/No CPR)  Medical Interventions: Limited Additional Interventions: Use medical treatment, IV fluids and cardiac monitoring as indicated, DO NOT USE intubation or mechanical ventilation. May consider use of less  invasive airway support such as BiPAP or CPAP. Also provide comfort measures. Transfer to the hospital if indicated. Avoid intensive care.   Antibiotics: Determine use of limitation of antibiotics when infection occurs  IV Fluids: IV fluids if indicated  Feeding Tube: Feeding tube for a defined trial period   Questions and concerns addressed   Objective Assessment: Vital Signs Vitals:   09/05/20 0533 09/05/20 0907  BP: (!) 130/93   Pulse: 67   Resp: 17 18  Temp: 97.9 F (36.6 C)   SpO2: 98%     Intake/Output Summary (Last 24 hours) at 09/05/2020 1012 Last data filed at 09/05/2020 3557 Gross per 24 hour  Intake 597 ml  Output 1400 ml  Net -803 ml   Last Weight  Most recent update: 09/02/2020  2:51 AM   Weight  160 kg (352 lb 11.8 oz)             Gen:  Older Caucasian M in NAD HEENT: moist mucous membranes CV: Regular rate and rhythm, no murmurs rubs or gallops PULM: On RA ABD: soft/nontender  EXT: (+) pedal edema Neuro: Alert and oriented x4  SUMMARY OF RECOMMENDATIONS   DNAR/DNI  MOST Completed, paper copy placed onto the chart electric copy can be found in [employer]  DNR Form Completed, paper copy placed onto the chart electric copy can be found in [employer]   Symptoms:  Chronic pain from low back and right knee (difficult to differentiate symptoms from back vs knee): ? Spinal stimulator in place.  ? S/P cortisone injection to R knee on 2/23 with oral taper ? Previously no relief from gabapentin or Tylenol but this could give him some relief from his acute right  knee pain even if not for his overall chronic pain. May consider adding in near future.  ? Fentanyl patch 50 mcg/hr 09/03/20 ? Oxycontin stopped ? Oxycodone-acetaminophen 5-325 mg every 1-2 Tabs PO 4 hours  ? Continue lidoderm patches to low back and right knee.  ? Continue flexeril TID as needed. Consider scheduled dosing. ? Continue Cymbalta.  ? Continue flexeril ? Consider consulting Dr. Sherley Bounds as  wife mentions that he has previously mentioned was seems like potentially nerve ablation. ? Ongoing PT/OT support  ? Gout management per primary team  Bowel Regimen:  ? LBM 09/03/20 ? Miralax 17g Daily ? senokot to 2 tabs twice daily ? Bisacodyl supp 64m PR Qday PRN ? Bisacodyl 176mPO x1   Time Spent: 25 Greater than 50% of the time was spent in counseling and coordination of care ______________________________________________________________________________________ [facility] Team Cell Phone: [phone] utilize secure chat with additional questions, if there is no response within 30 minutes please call the above phone number  Palliative Medicine Team providers are available by phone from 7am to 7pm daily and can be reached through the team cell phone.  Should this patient require assistance outside of these hours, please call the patient's attending physician.

## 2020-09-05 NOTE — Progress Notes (Signed)
Pt +3 with gait belt to slide onto [facility] for BM this afternoon. +3 with gait belt and front wheel walker to turn and pivot back onto bed.  Right knee is causing 9/10 pain with movement today. Pt had 2 percoset today.

## 2020-09-06 DIAGNOSIS — I5032 Chronic diastolic (congestive) heart failure: Secondary | ICD-10-CM | POA: Diagnosis not present

## 2020-09-06 DIAGNOSIS — E2740 Unspecified adrenocortical insufficiency: Secondary | ICD-10-CM | POA: Diagnosis not present

## 2020-09-06 MED ORDER — COLCHICINE 0.6 MG PO TABS
0.6000 mg | ORAL_TABLET | Freq: Once | ORAL | Status: AC
  Administered 2020-09-06: 0.6 mg via ORAL
  Filled 2020-09-06: qty 1

## 2020-09-06 NOTE — Discharge Instructions (Signed)

## 2020-09-06 NOTE — Progress Notes (Signed)
PROGRESS NOTE    Dennis Klabunde Sr.  QPR:[id number] DOB: 24-Apr-1946 DOA: 09/01/2020 PCP: [name], MD    Brief Narrative:  75 y.o. male with medical history significant of chronic diastolic CHF, HTN, hypogonadotrophic hypogonadism, Parkinson disease, paroxysmal atrial fibrillation on anticoagulation, hypothyroidism, gout, chronic right knee pain, chronic pain syndrome Pt presented with   worsening right knee pain.  Initially was seen on 19 February presented to emergency department with severe right knee pain unable to ambulate at all. Limited range of motion passively and actively. Knee tap was done at the time showed inflammatory changes Cultures still no growth.  Assessment & Plan:   Active Problems:   CAD (coronary artery disease)   Chronic diastolic CHF (congestive heart failure) (HCC)   Acquired hypothyroidism   Hypercholesterolemia   Morbid obesity (HCC)   Essential hypertension   Adrenal insufficiency (HCC)   Primary parkinsonism (HCC)   Paroxysmal atrial fibrillation ([place])   Hypopituitarism due to empty sella syndrome ([place])   Right knee pain   Present on Admission: . Right knee pain  -Non-infectious and no crystals seen on fluid analysis -Orthopedic following. Pt has known hx of osteoarthritic changes -Plan today for re-tap with injection of knee -PT/OT consulted with recs for SNF -This AM when seen with Palliative Care at bedside, pt was completely immobile and not tolerating even passive movement of RLE, secondary to marked and uncontrolled pain. -Appreciate assistance by Palliative Care regarding changes to long-acting analgesia. Cont with PT as tolerated  . Primary parkinsonism ([place])  -chronic stable continue home medication -Therapy recs for SNF noted   . Paroxysmal atrial fibrillation (HCC)  -Anticoagulation was placed on hold anticoagulation at time of presentation in anticipation of knee aspiration -Per Orthopedic Surgery, OK to resume  anticoagulation, will resume  . Morbid obesity ([place])  -Recommend diet/lifestyle modification  . Hypopituitarism due to empty sella syndrome ([place]) resume home medications  . Hypercholesterolemia -chronic stable continue crest  . Essential hypertension  -BP remains stable and controlled at this time  . Chronic diastolic CHF (congestive heart failure) (HCC) continue Lasix and -Currently appears euvolemic  . CAD (coronary artery disease) -chronic continue statin currently no chest pain Does not tolerate beta-blocker  . Adrenal insufficiency ([place]) continue Cortef  . Acquired hypothyroidism continue home medication check TSH  History of chronic pain continue home medications as tolerated and cont to titrate to pt comfort -Appreciate Palliative Care input regarding analgesia recs above  Transient hypoxia -currently on room air no monitor for any evidence of hypoxia with pain medication administration.  Hx gout -R ankle with increased pain and swelling today -Will give trial of colchicine   DVT prophylaxis: SCD's Code Status: DNR Family Communication: Pt in room, family at bedside  Status is: Inpatient  Requires inpatient stay because: Ongoing active pain requiring inpatient pain management and Unsafe d/c plan  Dispo: The patient is from: Home              Anticipated d/c is to: SNF              Anticipated d/c date is: 2 days              Patient currently is not medically stable to d/c.   Difficult to place patient No   Consultants:   Orthopedic Surgery  Palliative Care  Procedures:     Antimicrobials: Anti-infectives (From admission, onward)   None      Subjective: Complaining of R ankle pain and  swelling  Objective: Vitals:   09/05/20 0907 09/05/20 1357 09/05/20 2039 09/06/20 0511  BP:  117/74 115/81 116/73  Pulse:  80 66 63  Resp: 18 18 15 18   Temp:  98.9 F (37.2 C) 97.6 F (36.4 C)   TempSrc:  Oral Oral   SpO2:  97% 98% 98%  Weight:       Height:        Intake/Output Summary (Last 24 hours) at 09/06/2020 1400 Last data filed at 09/06/2020 6948 Gross per 24 hour  Intake 470 ml  Output 950 ml  Net -480 ml   Filed Weights   09/02/20 0250  Weight: (!) 160 kg    Examination: General exam: Awake, laying in bed, in nad Respiratory system: Normal respiratory effort, no wheezing Cardiovascular system: regular rate, s1, s2 Gastrointestinal system: Soft, nondistended, positive BS Central nervous system: CN2-12 grossly intact, strength intact Extremities: Perfused, no clubbing, increased R ankle swelling pain Skin: Normal skin turgor, no notable skin lesions seen Psychiatry: Mood normal // no visual hallucinations   Data Reviewed: I have personally reviewed following labs and imaging studies  CBC: Recent Labs  Lab 09/01/20 1352 09/02/20 0137  WBC 9.8 9.8  NEUTROABS  --  8.6*  HGB 15.6 14.1  HCT 48.0 44.4  MCV 85.0 84.3  PLT 203 546   Basic Metabolic Panel: Recent Labs  Lab 09/01/20 1352 09/02/20 0137  NA 132* 131*  K 4.2 4.1  CL 96* 96*  CO2 25 27  GLUCOSE 142* 115*  BUN 23 21  CREATININE 1.17 0.98  CALCIUM 9.5 9.3  MG  --  2.1  PHOS  --  3.0   GFR: Estimated Creatinine Clearance: 109.9 mL/min (by C-G formula based on SCr of 0.98 mg/dL). Liver Function Tests: Recent Labs  Lab 09/02/20 0137  AST 13*  ALT 5  ALKPHOS 47  BILITOT 1.2  PROT 6.8  ALBUMIN 3.4*   No results for input(s): LIPASE, AMYLASE in the last 168 hours. No results for input(s): AMMONIA in the last 168 hours. Coagulation Profile: No results for input(s): INR, PROTIME in the last 168 hours. Cardiac Enzymes: Recent Labs  Lab 09/01/20 2047  CKTOTAL 22*   BNP (last 3 results) No results for input(s): PROBNP in the last 8760 hours. HbA1C: No results for input(s): HGBA1C in the last 72 hours. CBG: No results for input(s): GLUCAP in the last 168 hours. Lipid Profile: No results for input(s): CHOL, HDL, LDLCALC, TRIG,  CHOLHDL, LDLDIRECT in the last 72 hours. Thyroid Function Tests: No results for input(s): TSH, T4TOTAL, FREET4, T3FREE, THYROIDAB in the last 72 hours. Anemia Panel: No results for input(s): VITAMINB12, FOLATE, FERRITIN, TIBC, IRON, RETICCTPCT in the last 72 hours. Sepsis Labs: Recent Labs  Lab 09/01/20 2120 09/02/20 0137  LATICACIDVEN 0.9 0.9    Recent Results (from the past 240 hour(s))  Gram stain     Status: None   Collection Time: 08/29/20  7:32 PM   Specimen: KNEE; Synovial Fluid  Result Value Ref Range Status   Specimen Description KNEE  Final   Special Requests NONE  Final   Gram Stain   Final    WBC SEEN FEW NO ORGANISMS SEEN Performed at [facility] [address] [place], [place] [address]    Report Status 08/29/2020 FINAL  Final  Body fluid culture w Gram Stain     Status: None   Collection Time: 08/29/20  7:32 PM   Specimen: KNEE; Synovial Fluid  Result  Value Ref Range Status   Specimen Description   Final    KNEE Performed at [facility] [address], [place], [place] [address]    Special Requests   Final    NONE Performed at [facility], [place] [address] [place], [place] [address]    Gram Stain   Final    FEW WBC PRESENT, PREDOMINANTLY PMN NO ORGANISMS SEEN    Culture   Final    NO GROWTH 3 DAYS Performed at [facility] [address], [place], [place] [address]    Report Status 09/02/2020 FINAL  Final  SARS CORONAVIRUS 2 (TAT 6-24 HRS) Nasopharyngeal Nasopharyngeal Swab     Status: None   Collection Time: 09/01/20  5:18 PM   Specimen: Nasopharyngeal Swab  Result Value Ref Range Status   SARS Coronavirus 2 NEGATIVE NEGATIVE Final    Comment: (NOTE) SARS-CoV-2 target nucleic acids are NOT DETECTED.  The SARS-CoV-2 RNA is generally detectable in upper and lower respiratory specimens during the acute phase of infection. Negative results do not preclude SARS-CoV-2 infection, do  not rule out co-infections with other pathogens, and should not be used as the sole basis for treatment or other patient management decisions. Negative results must be combined with clinical observations, patient history, and epidemiological information. The expected result is Negative.  Fact Sheet for Patients: SugarRoll.be  Fact Sheet for Healthcare Providers: https://www.woods-mathews.com/  This test is not yet approved or cleared by the [place] FDA and  has been authorized for detection and/or diagnosis of SARS-CoV-2 by FDA under an Emergency Use Authorization (EUA). This EUA will remain  in effect (meaning this test can be used) for the duration of the COVID-19 declaration under Se ction 564(b)(1) of the Act, 21 U.S.C. section 360bbb-3(b)(1), unless the authorization is terminated or revoked sooner.  Performed at [facility], [place] [address], [place], [place] [address]   Body fluid culture w Gram Stain     Status: None   Collection Time: 09/02/20  1:23 PM   Specimen: Body Fluid  Result Value Ref Range Status   Specimen Description FLUID KNEE RIGHT  Final   Special Requests SYNOVIAL FLUID RIGHT KNEE  Final   Gram Stain   Final    ABUNDANT WBC PRESENT, PREDOMINANTLY PMN NO ORGANISMS SEEN    Culture   Final    NO GROWTH 3 DAYS Performed at [facility], [place] [address], [place], [place] [address]    Report Status 09/05/2020 FINAL  Final     Radiology Studies: DG Ankle 2 Views Right  Result Date: 09/04/2020 CLINICAL DATA:  Pain in the LATERAL side of the ankle. No known injury. EXAM: RIGHT ANKLE - 2 VIEW COMPARISON:  None. FINDINGS: Mild degenerative changes are identified at the MEDIAL and LATERAL malleolus. No acute fracture or subluxation. Small plantar calcaneal spur. IMPRESSION: No evidence for acute abnormality. Electronically Signed   By: Nolon Nations M.D.   On: 09/04/2020 16:43    Scheduled Meds: .  allopurinol  450 mg Oral Daily  . apixaban  5 mg Oral BID  . bupivacaine  10 mL Infiltration Once  . carbidopa-levodopa  2 tablet Oral QID  . DULoxetine  60 mg Oral Daily  . fentaNYL  1 patch Transdermal Q72H  . furosemide  40 mg Oral Daily  . hydrocortisone  10 mg Oral Q supper  . hydrocortisone  20 mg Oral Daily  . levothyroxine  274 mcg  Oral Q0600   And  . levothyroxine  137 mcg Oral Once per day on Thu  . lidocaine  2 patch Transdermal Q24H  . methylPREDNISolone acetate  40 mg Intra-articular Once  . polyethylene glycol  17 g Oral Daily  . rosuvastatin  20 mg Oral Daily  . senna  2 tablet Oral BID  . sodium chloride flush  3 mL Intravenous Q12H  . topiramate  50 mg Oral BID   Continuous Infusions: . sodium chloride       LOS: 4 days   Marylu Lund, MD [facility] Pager On Amion  If 7PM-7AM, please contact night-coverage 09/06/2020, 2:00 PM

## 2020-09-07 DIAGNOSIS — E039 Hypothyroidism, unspecified: Secondary | ICD-10-CM | POA: Diagnosis not present

## 2020-09-07 DIAGNOSIS — Z515 Encounter for palliative care: Secondary | ICD-10-CM | POA: Diagnosis not present

## 2020-09-07 DIAGNOSIS — R52 Pain, unspecified: Secondary | ICD-10-CM | POA: Diagnosis not present

## 2020-09-07 DIAGNOSIS — G20 Parkinson's disease: Secondary | ICD-10-CM | POA: Diagnosis not present

## 2020-09-07 NOTE — TOC Progression Note (Signed)
Transition of Care (TOC) - Progression Note    Patient Details  Name: Khyrie Masi Dannenberg Sr. MRN: [id number] Date of Birth: 10/02/1945  Transition of Care [facility] CM/SW [place], [place] Phone Number: 09/07/2020, 4:30 PM  Clinical Narrative:     CSW called [facility] to review pts referral.   Expected Discharge Plan: [place] Barriers to Discharge: Continued Medical Work up  Expected Discharge Plan and Services Expected Discharge Plan: [place] arrangements for the past 2 months: [place] Determinants of Health (SDOH) Interventions    Readmission Risk Interventions Readmission Risk Prevention Plan 02/24/2019  Transportation Screening Complete  PCP or Specialist Appt within 5-7 Days Complete  Home Care Screening Complete  Medication Review (RN CM) Complete  Some recent data might be hidden   Emeterio Reeve, Latanya Presser, [place] Social Worker [phone]

## 2020-09-07 NOTE — Progress Notes (Signed)
Palliative:   HPI: 75 y.o.malewith past medical history of diastolic heart failure, hypertension, paroxysmal atrial fibrillation on anticoagulation, OSA on CPAP, hypogonadotrophic hypogonadism, Parkinson's disease (wheelchair bound), Addison's disease, h/o testicular and prostate cancer, gout, chronic right knee pain, chronic pain syndromeadmitted on 2/22/2022with worsening right knee pain.Followed by outpatient palliative care with [facility] last visit documented 08/20/20 where he is documented to be DNR and nearing hospice eligibility and that patient and family open to hospice support at home. Orthopedics to see for knee pain.  I met today with Dennis [name] and he gets his wife and son via speaker phone. He is feeling much better and they are very pleased with his pain control. Fentanyl patch seems more effective for him vs his previous regimen. The more difficult pain is in his right knee when weight bearing and limiting his mobility. Unfortunately I am not sure there are many options if injection is not providing relief. He has also had gout pain right above right ankle but this is much improved today as well. Plan for out of bed today and asked RN and Dennis [name] to make note of tolerance and description of limiting pain with activity. Otherwise we are awaiting TOC as they are working on placement options which seem to be very limited at the time.   All questions/concerns addressed. Emotional support provided.   Exam: Alert, oriented, good spirits. No distress. Pain improved. Breathing regular, unlabored. Abd soft with LBM 09/05/20. Much improved movement and range of motion of RLE.   Plan:   Chronic pain from low back and right knee (difficult to differentiate symptoms from back vs knee): ? Spinal stimulator in place.  ? Previously no relief from gabapentin or Tylenol but this could give him some relief from his acute right knee pain even if not for his overall chronic pain. May consider adding in near  future.  ? Fentanyl patch 50 mcg/hr 09/03/20. ? Oxycodone-acetaminophen 10-325 mg every 4 hours.  ? Continue lidoderm patches to low back and right knee.  ? Continue flexeril TID as needed. Consider scheduled dosing if needed. ? Continue Cymbalta.  ? Consider consulting Dr. Sherley Bounds as wife mentions that he has previously mentioned was seems like potentially nerve ablation.   Bowel Regimen:  ? LBM 09/05/20.  ? Miralax daily. ? Senokot 2 tabs twice daily.  ? Dulcolax suppository daily as needed.    [place], NP Palliative Medicine Team Pager [phone] (Please see [email or web address] for schedule) Team Phone [phone]    Greater than 50%  of this time was spent counseling and coordinating care related to the above assessment and plan

## 2020-09-07 NOTE — Progress Notes (Addendum)
     Subjective: Patient very happy with pain control today. He states he was able to get up on his own to the bed and to the bathroom with walker only. Feel like he's making improvements on pain and movement. Patient states pain is mainly at lateral malleolus of ankle and anterior distal tibia.   Objective:  PE: VITALS:   Vitals:   09/06/20 1530 09/06/20 1943 09/07/20 0409 09/07/20 1728  BP: 135/90 112/89 (!) 128/98 116/83  Pulse: 93 87 68 74  Resp: 18 20 17 18   Temp: 98.3 F (36.8 C) 97.9 F (36.6 C) (!) 97.5 F (36.4 C) 98.3 F (36.8 C)  TempSrc: Oral   Oral  SpO2: 95% 95% 97% 99%  Weight:      Height:       General: Sitting up in bed, in no acute distress MSK: Able to perform a straight leg raise. Approx 20-60 deg of AROM of knee. Dorsiflexion and Plantarflexion intact. Distal sensation intact. + DP pulse. No TTP to any aspect of knee. Mildly tender to lateral malleolus. No TTP to anterior tibia.   LABS  No results found for this or any previous visit (from the past 24 hour(s)).  No results found.  Assessment/Plan: Acute flare of right knee osteoarthritis: - both aspirations came back negative for organisms or crystals - patient has known osteoarthritis in his right knee and has had multiple arthritic flares requiring a cortisone injection, performed cortisone injection 2/23 - making steady improvement with pain and ROM since cortisone injection  - ok to WBAT RLE, up with PT as needed  Right ankle pain: - No swelling or erythema of ankle today - repeat x-rays taken 09/04/20 show no bony abnormalities - patient started on round of colchicine over the weekend for pain and swelling patient feels like pain has improved since start of colchicine, continue home allopurinol  Dispo: PT recommending SNF. TOC on board.   Contact information:   Weekdays 8-5 Merlene [address], [place] [phone] A fter hours and holidays please check [email or web address] for group call information for [facility]  Ventura Bruns 09/07/2020, 6:11 PM

## 2020-09-07 NOTE — Progress Notes (Signed)
PROGRESS NOTE    Dennis Wendling Sr.  WVP:[id number] DOB: 23-Jan-1946 DOA: 09/01/2020 PCP: [name], MD   Chief Complaint  Patient presents with  . Knee Pain  Brief Narrative: 80 [name] with history of chronic diastolic CHF, hypertension, chronic hypogonadotrophic hypogonadism/Addison's disease, Parkinson's disease, PAF on anticoagulation, hypothyroidism, gout chronic right pain with osteoarthritis, chronic back pain with back stimulator x1 year presented with worsening right knee pain.  He was initially seen in the ED on 2/19 with severe right knee pain unable to ambulate at all, limited ROM.Knee tap was done at the time showed inflammatory changes. Cultures no growth.  Patient was seen by orthopedics. Seen by palliative care for pain management.  Subjective: Seen and examined this morning. Rt knee pain improving Back stimulator on. Went to restroom and did okay On RA, not in distress   Assessment & Plan:  Severe right knee pain acute flare secondary to osteoarthritis status post right knee tap on 2/19 no crystals culture negative and repeat aspiration and also cortisone injection  2/23- knee fluid culture no growth, no crystals seen.  Making steady improvement on range of motion.  Pain is relatively controlled seen by palliative care-on fentanyl patch, Cymbalta, opiates. History of gout no crystal IN KNEE.  Continue allopurinol Chronic back pain/chronic pain with the stimulator in place  PAF on Eliquis resumed after arthrocentesis once cleared by orthopedics  CAD Chronic diastolic CHF Hypertension No chest pain.  Blood pressure stable.  CHF compensated.  Continue Lasix and statin.  Hypopituitarism due to empty sella syndrome Chronic hypogonadotrophic hypogonadism Addison's disease/hypothyroidism Continue patient's hydrocortisone twice daily, Synthroid  Parkinson's disease: Continue Sinemet  Morbid obesity w/ BMI 41: Advise weight loss PCP follow-up    Nutrition: Diet Order            Diet regular Room service appropriate? Yes; Fluid consistency: Thin  Diet effective now                DVT prophylaxis: SCDs Start: 09/01/20 2123 eliquis Code Status:   Code Status: DNR  Family Communication: plan of care discussed with patient at bedside.  Status is: Inpatient Remains inpatient appropriate because:Unsafe d/c plan and Inpatient level of care appropriate due to severity of illness  Dispo: The patient is from: Home              Anticipated d/c is to: SNF              Patient currently is medically stable to d/c.   Difficult to place patient No Consultants:see note  Procedures:see note  Unresulted Labs (From admission, onward)         None    Culture/Microbiology    Component Value Date/Time   SDES FLUID KNEE RIGHT 09/02/2020 1323   SPECREQUEST SYNOVIAL FLUID RIGHT KNEE 09/02/2020 1323   CULT  09/02/2020 1323    NO GROWTH 3 DAYS Performed at [facility], [place] [address], [place], [place] [address]    REPTSTATUS 09/05/2020 FINAL 09/02/2020 1323    Other culture-see note  Medications: Scheduled Meds: . allopurinol  450 mg Oral Daily  . apixaban  5 mg Oral BID  . bupivacaine  10 mL Infiltration Once  . carbidopa-levodopa  2 tablet Oral QID  . DULoxetine  60 mg Oral Daily  . fentaNYL  1 patch Transdermal Q72H  . furosemide  40 mg Oral Daily  . hydrocortisone  10 mg Oral Q supper  . hydrocortisone  20 mg  Oral Daily  . levothyroxine  274 mcg Oral Q0600   And  . levothyroxine  137 mcg Oral Once per day on Thu  . lidocaine  2 patch Transdermal Q24H  . methylPREDNISolone acetate  40 mg Intra-articular Once  . polyethylene glycol  17 g Oral Daily  . rosuvastatin  20 mg Oral Daily  . senna  2 tablet Oral BID  . sodium chloride flush  3 mL Intravenous Q12H  . topiramate  50 mg Oral BID   Continuous Infusions: . sodium chloride      Antimicrobials: Anti-infectives (From admission, onward)   None      Objective: Vitals: Today's Vitals   09/06/20 1530 09/06/20 1943 09/06/20 2105 09/07/20 0409  BP: 135/90 112/89  (!) 128/98  Pulse: 93 87  68  Resp: 18 20  17   Temp: 98.3 F (36.8 C) 97.9 F (36.6 C)  (!) 97.5 F (36.4 C)  TempSrc: Oral     SpO2: 95% 95%  97%  Weight:      Height:      PainSc:   0-No pain     Intake/Output Summary (Last 24 hours) at 09/07/2020 0930 Last data filed at 09/07/2020 0723 Gross per 24 hour  Intake 480 ml  Output 1850 ml  Net -1370 ml   Filed Weights   09/02/20 0250  Weight: (!) 160 kg   Weight change:   Intake/Output from previous day: 02/27 0701 - 02/28 0700 In: 480 [P.O.:480] Out: 1750 [Urine:1750] Intake/Output this shift: Total I/O In: -  Out: 450 [Urine:450] Filed Weights   09/02/20 0250  Weight: (!) 160 kg   Examination: General exam: AAOx3, obese ,NAD, weak appearing. HEENT:Oral mucosa moist, Ear/Nose WNL grossly,dentition normal. Respiratory system: bilaterally clear,no wheezing or crackles,no use of accessory muscle, non tender. Cardiovascular system: S1 & S2 +, regular, No JVD. Gastrointestinal system: Abdomen soft, NT,ND, BS+. Nervous System:Alert, awake, moving extremities and grossly nonfocal Extremities:Her right knee is tender patch in place, no edema, distal peripheral pulses palpable.  Skin: No rashes,no icterus. MSK: Normal muscle bulk,tone, power   Data Reviewed: I have personally reviewed following labs and imaging studies CBC: Recent Labs  Lab 09/01/20 1352 09/02/20 0137  WBC 9.8 9.8  NEUTROABS  --  8.6*  HGB 15.6 14.1  HCT 48.0 44.4  MCV 85.0 84.3  PLT 203 308   Basic Metabolic Panel: Recent Labs  Lab 09/01/20 1352 09/02/20 0137  NA 132* 131*  K 4.2 4.1  CL 96* 96*  CO2 25 27  GLUCOSE 142* 115*  BUN 23 21  CREATININE 1.17 0.98  CALCIUM 9.5 9.3  MG  --  2.1  PHOS  --  3.0   GFR: Estimated Creatinine Clearance: 109.9 mL/min (by C-G formula based on SCr of 0.98 mg/dL). Liver Function  Tests: Recent Labs  Lab 09/02/20 0137  AST 13*  ALT 5  ALKPHOS 47  BILITOT 1.2  PROT 6.8  ALBUMIN 3.4*   No results for input(s): LIPASE, AMYLASE in the last 168 hours. No results for input(s): AMMONIA in the last 168 hours. Coagulation Profile: No results for input(s): INR, PROTIME in the last 168 hours. Cardiac Enzymes: Recent Labs  Lab 09/01/20 2047  CKTOTAL 22*   BNP (last 3 results) No results for input(s): PROBNP in the last 8760 hours. HbA1C: No results for input(s): HGBA1C in the last 72 hours. CBG: No results for input(s): GLUCAP in the last 168 hours. Lipid Profile: No results for input(s): CHOL, HDL, LDLCALC,  TRIG, CHOLHDL, LDLDIRECT in the last 72 hours. Thyroid Function Tests: No results for input(s): TSH, T4TOTAL, FREET4, T3FREE, THYROIDAB in the last 72 hours. Anemia Panel: No results for input(s): VITAMINB12, FOLATE, FERRITIN, TIBC, IRON, RETICCTPCT in the last 72 hours. Sepsis Labs: Recent Labs  Lab 09/01/20 2120 09/02/20 0137  LATICACIDVEN 0.9 0.9    Recent Results (from the past 240 hour(s))  Gram stain     Status: None   Collection Time: 08/29/20  7:32 PM   Specimen: KNEE; Synovial Fluid  Result Value Ref Range Status   Specimen Description KNEE  Final   Special Requests NONE  Final   Gram Stain   Final    WBC SEEN FEW NO ORGANISMS SEEN Performed at [facility] [address], [place], [place] [address]    Report Status 08/29/2020 FINAL  Final  Body fluid culture w Gram Stain     Status: None   Collection Time: 08/29/20  7:32 PM   Specimen: KNEE; Synovial Fluid  Result Value Ref Range Status   Specimen Description   Final    KNEE Performed at [facility] [address], [place], [place] [address]    Special Requests   Final    NONE Performed at [facility], [place] [address], [place], [place] [address]    Gram Stain   Final    FEW WBC PRESENT, PREDOMINANTLY PMN NO  ORGANISMS SEEN    Culture   Final    NO GROWTH 3 DAYS Performed at [facility] [address], [place], [place] [address]    Report Status 09/02/2020 FINAL  Final  SARS CORONAVIRUS 2 (TAT 6-24 HRS) Nasopharyngeal Nasopharyngeal Swab     Status: None   Collection Time: 09/01/20  5:18 PM   Specimen: Nasopharyngeal Swab  Result Value Ref Range Status   SARS Coronavirus 2 NEGATIVE NEGATIVE Final    Comment: (NOTE) SARS-CoV-2 target nucleic acids are NOT DETECTED.  The SARS-CoV-2 RNA is generally detectable in upper and lower respiratory specimens during the acute phase of infection. Negative results do not preclude SARS-CoV-2 infection, do not rule out co-infections with other pathogens, and should not be used as the sole basis for treatment or other patient management decisions. Negative results must be combined with clinical observations, patient history, and epidemiological information. The expected result is Negative.  Fact Sheet for Patients: SugarRoll.be  Fact Sheet for Healthcare Providers: https://www.woods-mathews.com/  This test is not yet approved or cleared by the [place] FDA and  has been authorized for detection and/or diagnosis of SARS-CoV-2 by FDA under an Emergency Use Authorization (EUA). This EUA will remain  in effect (meaning this test can be used) for the duration of the COVID-19 declaration under Se ction 564(b)(1) of the Act, 21 U.S.C. section 360bbb-3(b)(1), unless the authorization is terminated or revoked sooner.  Performed at [facility], [place] [address] [place], [place] [address]   Body fluid culture w Gram Stain     Status: None   Collection Time: 09/02/20  1:23 PM   Specimen: Body Fluid  Result Value Ref Range Status   Specimen Description FLUID KNEE RIGHT  Final   Special Requests SYNOVIAL FLUID RIGHT KNEE  Final   Gram Stain   Final    ABUNDANT WBC PRESENT, PREDOMINANTLY PMN NO  ORGANISMS SEEN    Culture   Final    NO GROWTH 3 DAYS Performed at [facility], [place]  [address] [place], [place] [address]    Report Status 09/05/2020 FINAL  Final     Radiology Studies: No results found.   LOS: 5 days   Antonieta Pert, MD [facility]  09/07/2020, 9:30 AM

## 2020-09-08 DIAGNOSIS — G8929 Other chronic pain: Secondary | ICD-10-CM | POA: Diagnosis not present

## 2020-09-08 DIAGNOSIS — Z515 Encounter for palliative care: Secondary | ICD-10-CM | POA: Diagnosis not present

## 2020-09-08 DIAGNOSIS — M5441 Lumbago with sciatica, right side: Secondary | ICD-10-CM | POA: Diagnosis not present

## 2020-09-08 DIAGNOSIS — M25561 Pain in right knee: Secondary | ICD-10-CM | POA: Diagnosis not present

## 2020-09-08 LAB — SARS CORONAVIRUS 2 (TAT 6-24 HRS): SARS Coronavirus 2: NEGATIVE

## 2020-09-08 NOTE — Progress Notes (Signed)
PROGRESS NOTE    Dennis Mcfadyen Sr.  EXB:[id number] DOB: July 20, 1945 DOA: 09/01/2020 PCP: [name], MD   Chief Complaint  Patient presents with  . Knee Pain  Brief Narrative: 62 [name] with history of chronic diastolic CHF, hypertension, chronic hypogonadotrophic hypogonadism/Addison's disease, Parkinson's disease, PAF on anticoagulation, hypothyroidism, gout chronic right pain with osteoarthritis, chronic back pain with back stimulator x1 year presented with worsening right knee pain.  He was initially seen in the ED on 2/19 with severe right knee pain unable to ambulate at all, limited ROM.Knee tap was done at the time showed inflammatory changes. Cultures no growth.  Patient was seen by orthopedics. Seen by palliative care for pain management. At this time patient pain is well controlled PT suggested SNF-looking into placement  Subjective: Seen this morning. Reports he went to the bathroom and came back pain is controlled. No new complaints.  Assessment & Plan:  Severe right knee pain, acute flare secondary to osteoarthritis S/P Rt knee tap on 2/19 no crystals culture negative and repeat aspiration and also cortisone injection  2/23- knee fluid culture no growth. Seen by palliative for pain management. At this time pain is well controlled cont on fentanyl patch 50 MCG, Cymbalta, lidocaine patch, opiates prn, muscle relaxant. Remains deconditioned and looking into SNF.  History of gout no crystal IN KNEE. On allopurinol  Chronic back pain/chronic pain with the stimulator in place. Pain management as above.  PAF continue on his Eliquis   CAD Chronic diastolic CHF Hypertension No chest pain. BP is well controlled, CHF is compensated. Continue home Lasix and statin.  Hypopituitarism due to empty sella syndrome Chronic hypogonadotrophic hypogonadism Addison's disease/hypothyroidism Remains a stable, will continue his home hydrocortisone twice daily,  Synthroid.  Parkinson's disease: On Sinemet  Morbid obesity w/ BMI 41: Advise weight loss PCP follow-up   Nutrition: Diet Order            Diet regular Room service appropriate? Yes; Fluid consistency: Thin  Diet effective now                DVT prophylaxis: SCDs Start: 09/01/20 2123 eliquis Code Status:   Code Status: DNR  Family Communication: plan of care discussed with patient at bedside. Self updating. Discussed with TOC Status is: Inpatient Remains inpatient appropriate because:Unsafe d/c plan and Inpatient level of care appropriate due to severity of illness  Dispo: The patient is from: Home              Anticipated d/c is to: SNF once bed available. COVID swab ordered              Patient currently is medically stable to d/c.   Difficult to place patient No Consultants:see note  Procedures:see note  Unresulted Labs (From admission, onward)          Start     Ordered   Unscheduled  SARS CORONAVIRUS 2 (TAT 6-24 HRS) Nasopharyngeal Nasopharyngeal Swab  Once,   R       Question Answer Comment  Is this test for diagnosis or screening Screening   Symptomatic for COVID-19 as defined by CDC No   Hospitalized for COVID-19 No   Admitted to ICU for COVID-19 No   Previously tested for COVID-19 Yes   Resident in a congregate (group) care setting Yes   Employed in healthcare setting No   Has patient completed COVID vaccination(s) (2 doses of Pfizer/Moderna 1 dose of [employer]) Unknown  09/08/20 1107        Culture/Microbiology    Component Value Date/Time   SDES FLUID KNEE RIGHT 09/02/2020 1323   SPECREQUEST SYNOVIAL FLUID RIGHT KNEE 09/02/2020 1323   CULT  09/02/2020 1323    NO GROWTH 3 DAYS Performed at [facility], [place] [address] [place], [place] [address]    REPTSTATUS 09/05/2020 FINAL 09/02/2020 1323    Other culture-see note  Medications: Scheduled Meds: . allopurinol  450 mg Oral Daily  . apixaban  5 mg Oral BID  . bupivacaine  10  mL Infiltration Once  . carbidopa-levodopa  2 tablet Oral QID  . DULoxetine  60 mg Oral Daily  . fentaNYL  1 patch Transdermal Q72H  . furosemide  40 mg Oral Daily  . hydrocortisone  10 mg Oral Q supper  . hydrocortisone  20 mg Oral Daily  . levothyroxine  274 mcg Oral Q0600   And  . levothyroxine  137 mcg Oral Once per day on Thu  . lidocaine  2 patch Transdermal Q24H  . methylPREDNISolone acetate  40 mg Intra-articular Once  . polyethylene glycol  17 g Oral Daily  . rosuvastatin  20 mg Oral Daily  . senna  2 tablet Oral BID  . sodium chloride flush  3 mL Intravenous Q12H  . topiramate  50 mg Oral BID   Continuous Infusions: . sodium chloride      Antimicrobials: Anti-infectives (From admission, onward)   None     Objective: Vitals: Today's Vitals   09/07/20 0943 09/07/20 1728 09/07/20 2318 09/08/20 0448  BP:  116/83  124/87  Pulse:  74  63  Resp:  18  18  Temp:  98.3 F (36.8 C)  97.7 F (36.5 C)  TempSrc:  Oral  Oral  SpO2:  99%  100%  Weight:      Height:      PainSc: 0-No pain  0-No pain     Intake/Output Summary (Last 24 hours) at 09/08/2020 1107 Last data filed at 09/08/2020 1101 Gross per 24 hour  Intake 2057 ml  Output 200 ml  Net 1857 ml   Filed Weights   09/02/20 0250  Weight: (!) 160 kg   Weight change:   Intake/Output from previous day: 02/28 0701 - 03/01 0700 In: 1790 [P.O.:1790] Out: 650 [Urine:650] Intake/Output this shift: Total I/O In: 687 [P.O.:687] Out: -  Filed Weights   09/02/20 0250  Weight: (!) 160 kg   Examination: General exam: AAOx3, obese, NAD, weak appearing. HEENT:Oral mucosa moist, Ear/Nose WNL grossly, dentition normal. Respiratory system: bilaterally clear,no wheezing or crackles,no use of accessory muscle Cardiovascular system: S1 & S2 +, No JVD,. Gastrointestinal system: Abdomen soft, NT,ND, BS+ Nervous System:Alert, awake, moving extremities and grossly nonfocal Extremities: Lidoderm patch in the right knee no  edema, distal peripheral pulses palpable.  Skin: No rashes,no icterus. MSK: Normal muscle bulk,tone, power  Data Reviewed: I have personally reviewed following labs and imaging studies CBC: Recent Labs  Lab 09/01/20 1352 09/02/20 0137  WBC 9.8 9.8  NEUTROABS  --  8.6*  HGB 15.6 14.1  HCT 48.0 44.4  MCV 85.0 84.3  PLT 203 326   Basic Metabolic Panel: Recent Labs  Lab 09/01/20 1352 09/02/20 0137  NA 132* 131*  K 4.2 4.1  CL 96* 96*  CO2 25 27  GLUCOSE 142* 115*  BUN 23 21  CREATININE 1.17 0.98  CALCIUM 9.5 9.3  MG  --  2.1  PHOS  --  3.0   GFR: Estimated Creatinine Clearance: 109.9 mL/min (by C-G formula based on SCr of 0.98 mg/dL). Liver Function Tests: Recent Labs  Lab 09/02/20 0137  AST 13*  ALT 5  ALKPHOS 47  BILITOT 1.2  PROT 6.8  ALBUMIN 3.4*   No results for input(s): LIPASE, AMYLASE in the last 168 hours. No results for input(s): AMMONIA in the last 168 hours. Coagulation Profile: No results for input(s): INR, PROTIME in the last 168 hours. Cardiac Enzymes: Recent Labs  Lab 09/01/20 2047  CKTOTAL 22*   BNP (last 3 results) No results for input(s): PROBNP in the last 8760 hours. HbA1C: No results for input(s): HGBA1C in the last 72 hours. CBG: No results for input(s): GLUCAP in the last 168 hours. Lipid Profile: No results for input(s): CHOL, HDL, LDLCALC, TRIG, CHOLHDL, LDLDIRECT in the last 72 hours. Thyroid Function Tests: No results for input(s): TSH, T4TOTAL, FREET4, T3FREE, THYROIDAB in the last 72 hours. Anemia Panel: No results for input(s): VITAMINB12, FOLATE, FERRITIN, TIBC, IRON, RETICCTPCT in the last 72 hours. Sepsis Labs: Recent Labs  Lab 09/01/20 2120 09/02/20 0137  LATICACIDVEN 0.9 0.9    Recent Results (from the past 240 hour(s))  Gram stain     Status: None   Collection Time: 08/29/20  7:32 PM   Specimen: KNEE; Synovial Fluid  Result Value Ref Range Status   Specimen Description KNEE  Final   Special Requests  NONE  Final   Gram Stain   Final    WBC SEEN FEW NO ORGANISMS SEEN Performed at [facility] [address], [place], [place] [address]    Report Status 08/29/2020 FINAL  Final  Body fluid culture w Gram Stain     Status: None   Collection Time: 08/29/20  7:32 PM   Specimen: KNEE; Synovial Fluid  Result Value Ref Range Status   Specimen Description   Final    KNEE Performed at [facility] [address], [place], [place] [address]    Special Requests   Final    NONE Performed at [facility], [place] [address] [place], [place] [address]    Gram Stain   Final    FEW WBC PRESENT, PREDOMINANTLY PMN NO ORGANISMS SEEN    Culture   Final    NO GROWTH 3 DAYS Performed at [facility] [address], [place], [place] [address]    Report Status 09/02/2020 FINAL  Final  SARS CORONAVIRUS 2 (TAT 6-24 HRS) Nasopharyngeal Nasopharyngeal Swab     Status: None   Collection Time: 09/01/20  5:18 PM   Specimen: Nasopharyngeal Swab  Result Value Ref Range Status   SARS Coronavirus 2 NEGATIVE NEGATIVE Final    Comment: (NOTE) SARS-CoV-2 target nucleic acids are NOT DETECTED.  The SARS-CoV-2 RNA is generally detectable in upper and lower respiratory specimens during the acute phase of infection. Negative results do not preclude SARS-CoV-2 infection, do not rule out co-infections with other pathogens, and should not be used as the sole basis for treatment or other patient management decisions. Negative results must be combined with clinical observations, patient history, and epidemiological information. The expected result is Negative.  Fact Sheet for Patients: SugarRoll.be  Fact Sheet for Healthcare Providers: https://www.woods-mathews.com/  This test is not yet approved or cleared by the [place] FDA and  has been authorized for detection and/or diagnosis of SARS-CoV-2 by FDA  under an Emergency Use Authorization (EUA). This EUA will remain  in  effect (meaning this test can be used) for the duration of the COVID-19 declaration under Se ction 564(b)(1) of the Act, 21 U.S.C. section 360bbb-3(b)(1), unless the authorization is terminated or revoked sooner.  Performed at [facility], [place] [address], [place], [place] [address]   Body fluid culture w Gram Stain     Status: None   Collection Time: 09/02/20  1:23 PM   Specimen: Body Fluid  Result Value Ref Range Status   Specimen Description FLUID KNEE RIGHT  Final   Special Requests SYNOVIAL FLUID RIGHT KNEE  Final   Gram Stain   Final    ABUNDANT WBC PRESENT, PREDOMINANTLY PMN NO ORGANISMS SEEN    Culture   Final    NO GROWTH 3 DAYS Performed at [facility], [place] [address], [place], [place] [address]    Report Status 09/05/2020 FINAL  Final     Radiology Studies: No results found.   LOS: 6 days   Antonieta Pert, MD [facility]  09/08/2020, 11:07 AM

## 2020-09-08 NOTE — TOC Progression Note (Signed)
Transition of Care (TOC) - Progression Note    Patient Details  Name: Dennis Houseworth Chisom Sr. MRN: [id number] Date of Birth: [birth year]  Transition of Care [facility]) CM/SW [place], [place] Phone Number: 09/08/2020, 3:48 PM  Clinical Narrative:     [facility] has accepted pt and can take him tomorrow. Covid test is pending.   Expected Discharge Plan: [place] Barriers to Discharge: Continued Medical Work up  Expected Discharge Plan and Services Expected Discharge Plan: [place] arrangements for the past 2 months: [place] Determinants of Health (SDOH) Interventions    Readmission Risk Interventions Readmission Risk Prevention Plan 02/24/2019  Transportation Screening Complete  PCP or Specialist Appt within 5-7 Days Complete  Home Care Screening Complete  Medication Review (RN CM) Complete  Some recent data might be hidden   Emeterio Reeve, Latanya Presser, [place] Social Worker [phone]

## 2020-09-08 NOTE — Plan of Care (Signed)

## 2020-09-08 NOTE — Progress Notes (Signed)
PT Cancellation Note  Patient Details Name: Dennis Rickett Tory Sr. MRN: [id number] DOB: 10-Sep-1945   Cancelled Treatment:    Reason Eval/Treat Not Completed: Other (comment).  Pt was not seen as he had had other therapy and declined to get up with PT.  Follow up with him as time and pt allow.   Ramond Dial 09/08/2020, 12:45 PM   Mee Hives, PT MS Acute Rehab Dept. Number: [place] and [place]

## 2020-09-08 NOTE — TOC Progression Note (Signed)
Transition of Care (TOC) - Progression Note    Patient Details  Name: Dennis Shimabukuro Recker Sr. MRN: [id number] Date of Birth: 01-30-1946  Transition of Care [name]) CM/SW [place], [place] Phone Number: 09/08/2020, 11:25 AM  Clinical Narrative:     Pts [name] called CSW this morning to inquire about bed offers. CSW explained that [employer] declined pt and did not give a reason as to why. Dennis [name] requested CSW to reach out to [facility], CSW left message. Dennis [name] states if [name] declines they will go with [employer].   [name] was also upset that pts VA is listed as primary. Dennis [name] states  Medicare A&B should be primary, then [name], [name]. CSW called admitting and spoke to [place] who stated that [place] has to be listed primary and medicare secondary. AARP 3rd.   Wife stated she called [name] and was told pt was declined due to [name] and requested CSW resubmit, which CSW did.   Currently waiting to hear back from [name] and [name].   Expected Discharge Plan: [name] Barriers to Discharge: Continued Medical Work up  Expected Discharge Plan and Services Expected Discharge Plan: [place] arrangements for the past 2 months: [place] Determinants of Health (SDOH) Interventions    Readmission Risk Interventions Readmission Risk Prevention Plan 02/24/2019  Transportation Screening Complete  PCP or Specialist Appt within 5-7 Days Complete  Home Care Screening Complete  Medication Review (RN CM) Complete  Some recent data might be hidden   Emeterio Reeve, Latanya Presser, [place] Social Worker [phone]

## 2020-09-08 NOTE — Progress Notes (Signed)
Palliative:  HPI:75 y.o.malewith past medical history of diastolic heart failure, hypertension, paroxysmal atrial fibrillation on anticoagulation, OSA on CPAP, hypogonadotrophic hypogonadism, Parkinson's disease (wheelchair bound), Addison's disease, h/o testicular and prostate cancer, gout, chronic right knee pain, chronic pain syndromeadmitted on 2/22/2022with worsening right knee pain.Followed by outpatient palliative care with [facility] last visit documented 08/20/20 where he is documented to be DNR and nearing hospice eligibility and that patient and family open to hospice support at home. Orthopedics to see for knee pain.  I met today with Mr. Huebert and he has his wife on speaker phone. Mr. Goens shares that he tolerated up and ambulating very well with some pain but pain was tolerable. This is a great improvement. We all agree there is no need for any further changes to his pain regimen at this time.   Wife, [name], shares frustration with placement and fear that the facilities denying him are not getting the accurate information regarding his payor source (should be Medicare not VA) and that this is why he is being denied. She has been in contact with [name] and they have told her that they have rehab beds available and they can likely accept him if it is under Medicare benefit but they do not take [place]. I will communicate with TOC their concerns regarding placement.   All questions/concerns addressed. Emotional support provided.   Exam: Alert, oriented, good spirits. No distress. Pain improved. Breathing regular, unlabored. Abd soft with LBM 09/08/20. Much improved movement and range of motion of RLE.   Plan:  Gout pain just above right ankle to right anterior distal tibia: Improved with great relief from colchicine.   Chronic pain from low back and right knee (difficult to differentiate symptoms from back vs knee): ? Spinal stimulatorin place. ? Previously no relief from  gabapentin or Tylenol. ? Fentanyl patch 50 mcg/hr 09/03/20. ? Oxycodone-acetaminophen 10-325 mg every 4 hours.  ? Continue lidoderm patches to low back and right knee.  ? Continue flexeril TID as needed.  ? Continue Cymbalta. ? Consider consulting Dr. Sherley Bounds as wife mentions that he has previously mentioned was seems like potentially nerve ablation.  Bowel Regimen:  ? LBM 09/08/20.  ? Miralax daily. ? Senokot 2 tabs twice daily.  ? Dulcolax suppository daily as needed.   15 min  Vinie Sill, NP Palliative Medicine Team Pager [phone] (Please see [email or web address] for schedule) Team Phone [phone]    Greater than 50%  of this time was spent counseling and coordinating care related to the above assessment and plan

## 2020-09-08 NOTE — Progress Notes (Signed)
Physical Therapy Treatment Patient Details Name: Dennis [name]. MRN: [id number] DOB: August 29, 1945 Today's Date: 09/08/2020    History of Present Illness Pt is a 75 year old man admitted on 09/01/20 with severe R knee pain and inability to ambulate. Underwent aspiration of knee on 09/02/20. PMH: Parkinsons Disease, CHF, Addison's Disease, PAF, gout, back surgery.    PT Comments    Pt was assisted to stand with impulsive tendencies of his to avoid any staff assist to stand or walk.  Pt is unsafe, unable to make good safety choices, but is also able to walk short trips with lesser help now.  Follow ing along with him to get him ready for rehab stay, and will need to increase endurance, increase static and dynamic balance as well as work on quality of gait.  Follow for acute goals of PT.   Follow Up Recommendations  SNF     Equipment Recommendations  None recommended by PT    Recommendations for Other Services       Precautions / Restrictions Precautions Precautions: Fall Precaution Comments: parkinsons, addisons Restrictions Weight Bearing Restrictions: No    Mobility  Bed Mobility Overal bed mobility: Needs Assistance Bed Mobility: Supine to Sit     Supine to sit: Min assist;Min guard          Transfers Overall transfer level: Needs assistance Equipment used: Rolling walker (2 wheeled) Transfers: Sit to/from Stand Sit to Stand: Min guard         General transfer comment: pt strongly refusing therapist to assist physically with transition, cues provided with close min guard of therapist no LOB.  Ambulation/Gait Ambulation/Gait assistance: Min assist Gait Distance (Feet): 20 Feet Assistive device: Rolling walker (2 wheeled) Gait Pattern/deviations: Wide base of support;Trunk flexed;Leaning posteriorly;Step-through pattern Gait velocity: decreased   General Gait Details: pt is unsafe to walk alone, cannot stand and chooses to pull up on walker, very  unsafe   Chief Strategy Officer    Modified Rankin (Stroke Patients Only)       Balance Overall balance assessment: Needs assistance Sitting-balance support: Feet supported Sitting balance-Leahy Scale: Fair     Standing balance support: Bilateral upper extremity supported;During functional activity Standing balance-Leahy Scale: Poor                              Cognition Arousal/Alertness: Awake/alert Behavior During Therapy: Impulsive Overall Cognitive Status: Impaired/Different from baseline Area of Impairment: Safety/judgement;Following commands                     Memory: Decreased recall of precautions;Decreased short-term memory Following Commands: Follows one step commands inconsistently;Follows one step commands with increased time Safety/Judgement: Decreased awareness of safety;Decreased awareness of deficits   Problem Solving: Slow processing;Requires verbal cues;Requires tactile cues General Comments: pt is demanding to PT that she not touch him during gait, which had to be discussed with pt as he cannot do this      Exercises      General Comments General comments (skin integrity, edema, etc.): pt was assisted to walk and was angry with ins company at the time, distracted and less safe      Pertinent Vitals/Pain Pain Assessment: No/denies pain    Home Living                      Prior  Function            PT Goals (current goals can now be found in the care plan section) Acute Rehab PT Goals Patient Stated Goal: go to rehab, pain relief Progress towards PT goals: Progressing toward goals    Frequency    Min 2X/week      PT Plan Current plan remains appropriate    Co-evaluation              AM-PAC PT "6 Clicks" Mobility   Outcome Measure  Help needed turning from your back to your side while in a flat bed without using bedrails?: A Little Help needed moving from lying on your  back to sitting on the side of a flat bed without using bedrails?: A Little Help needed moving to and from a bed to a chair (including a wheelchair)?: A Little Help needed standing up from a chair using your arms (e.g., wheelchair or bedside chair)?: A Lot Help needed to walk in hospital room?: A Little Help needed climbing 3-5 steps with a railing? : Total 6 Click Score: 15    End of Session Equipment Utilized During Treatment: Gait belt Activity Tolerance: Patient limited by fatigue;Treatment limited secondary to agitation Patient left: in chair;with call bell/phone within reach;with chair alarm set Nurse Communication: Mobility status PT Visit Diagnosis: Unsteadiness on feet (R26.81);Muscle weakness (generalized) (M62.81);Other abnormalities of gait and mobility (R26.89)     Time: 3825-0539 PT Time Calculation (min) (ACUTE ONLY): 18 min  Charges:  $Gait Training: 8-22 mins               Ramond Dial 09/08/2020, 6:13 PM Mee Hives, PT MS Acute Rehab Dept. Number: [place]

## 2020-09-08 NOTE — Progress Notes (Signed)
Occupational Therapy Treatment Patient Details Name: Dennis Taras Dray Sr. MRN: [id number] DOB: 06/09/1946 Today's Date: 09/08/2020    History of present illness Pt is a 75 year old man admitted on 09/01/20 with severe R knee pain and inability to ambulate. Underwent aspiration of knee on 09/02/20. PMH: Parkinsons Disease, CHF, Addison's Disease, PAF, gout, back surgery.   OT comments  Pt pleasant and agreeable to participation with session, demonstrating significantly improved overall performance. Repeatedly requesting OT to not "touch him/put hands on him" during transfers and in room mobility and refusing use of gait belt despite education and encouragement on benefit and safety concerns. Pt demo's ability to complete bed mobility supine>sitting with sup and heavy reliance on bed rails/controls of which he has at home, sit>standing and in room mobility with slowed speed and Min guard (close), and min A for return to supine. Limited tolerance for static standing on trial of ADL's, with review and education on modification and use of DME to maximize indep and safety. Ot will continue to follow acutely, with recommendations listed below.    Follow Up Recommendations  SNF;Supervision/Assistance - 24 hour    Equipment Recommendations  None recommended by OT    Recommendations for Other Services      Precautions / Restrictions Precautions Precautions: Fall Precaution Comments: parkinsons, addisons Restrictions Weight Bearing Restrictions: No       Mobility Bed Mobility Overal bed mobility: Needs Assistance Bed Mobility: Supine to Sit;Sit to Supine     Supine to sit: Min guard;HOB elevated Sit to supine: Min guard;HOB elevated   General bed mobility comments: increased time for sequencing, HOb elevated, heavy reliance on rails of preference to scooting to foot of bed.    Transfers       Sit to Stand: Min guard         General transfer comment: pt strongly refusing therapist  to assist physically with transition, cues provided with close min guard of therapist no LOB.    Balance Overall balance assessment: Needs assistance Sitting-balance support: Feet unsupported;Single extremity supported       Standing balance support: Bilateral upper extremity supported                               ADL either performed or assessed with clinical judgement   ADL Overall ADL's : Needs assistance/impaired                 Upper Body Dressing : Set up;Sitting   Lower Body Dressing: Moderate assistance;Sit to/from stand       Toileting- Water quality scientist and Hygiene: Maximal assistance               Vision       Perception     Praxis      Cognition Arousal/Alertness: Awake/alert Behavior During Therapy: WFL for tasks assessed/performed Overall Cognitive Status: Impaired/Different from baseline Area of Impairment: Safety/judgement                         Safety/Judgement: Decreased awareness of safety              Exercises     Shoulder Instructions       General Comments      Pertinent Vitals/ Pain       Pain Assessment: No/denies pain  Home Living  Prior Functioning/Environment              Frequency  Min 1X/week        Progress Toward Goals  OT Goals(current goals can now be found in the care plan section)  Progress towards OT goals: Progressing toward goals  Acute Rehab OT Goals Patient Stated Goal: go to rehab, pain relief OT Goal Formulation: With patient Time For Goal Achievement: 09/16/20 Potential to Achieve Goals: Good ADL Goals Pt Will Transfer to Toilet: with modified independence;ambulating;bedside commode  Plan Discharge plan remains appropriate    Co-evaluation                 AM-PAC OT "6 Clicks" Daily Activity     Outcome Measure   Help from another person eating meals?: None Help from another  person taking care of personal grooming?: A Little Help from another person toileting, which includes using toliet, bedpan, or urinal?: Total Help from another person bathing (including washing, rinsing, drying)?: A Lot Help from another person to put on and taking off regular upper body clothing?: A Little Help from another person to put on and taking off regular lower body clothing?: A Lot 6 Click Score: 15    End of Session Equipment Utilized During Treatment: Rolling walker  OT Visit Diagnosis: Unsteadiness on feet (R26.81);Pain   Activity Tolerance Patient limited by pain   Patient Left in bed;with call bell/phone within reach;with family/visitor present   Nurse Communication Mobility status    Time: 6468-0321 OT Time Calculation (min): 34 min  Charges: OT General Charges $OT Visit: 1 Visit OT Treatments $Self Care/Home Management : 23-37 mins  Pinchus Weckwerth OTR/L acute rehab services Office: [phone]  09/08/2020, 2:15 PM

## 2020-09-09 DIAGNOSIS — I2510 Atherosclerotic heart disease of native coronary artery without angina pectoris: Secondary | ICD-10-CM | POA: Diagnosis not present

## 2020-09-09 DIAGNOSIS — Z7189 Other specified counseling: Secondary | ICD-10-CM | POA: Diagnosis not present

## 2020-09-09 DIAGNOSIS — E2740 Unspecified adrenocortical insufficiency: Secondary | ICD-10-CM | POA: Diagnosis not present

## 2020-09-09 DIAGNOSIS — Z515 Encounter for palliative care: Secondary | ICD-10-CM | POA: Diagnosis not present

## 2020-09-09 DIAGNOSIS — G20 Parkinson's disease: Secondary | ICD-10-CM | POA: Diagnosis not present

## 2020-09-09 DIAGNOSIS — E039 Hypothyroidism, unspecified: Secondary | ICD-10-CM | POA: Diagnosis not present

## 2020-09-09 DIAGNOSIS — I10 Essential (primary) hypertension: Secondary | ICD-10-CM | POA: Diagnosis not present

## 2020-09-09 NOTE — Progress Notes (Addendum)
Palliative:  HPI:74 y.o.malewith past medical history of diastolic heart failure, hypertension, paroxysmal atrial fibrillation on anticoagulation, OSA on CPAP, hypogonadotrophic hypogonadism, Parkinson's disease (wheelchair bound), Addison's disease, h/o testicular and prostate cancer, gout, chronic right knee pain, chronic pain syndromeadmitted on 2/22/2022with worsening right knee pain.Followed by outpatient palliative care with [facility] last visit documented 08/20/20 where he is documented to be DNR and nearing hospice eligibility and that patient and family open to hospice support at home. Orthopedics to see for knee pain.  I spoke today with Dennis [name] and wife, Dennis [name] (via speaker phone). They are frustrated as there is an issue with rehab bed that is available but facility is requiring them to close a claim that has been open since 2013. Dennis [name] and Dennis [name] tell me that they were not even aware of any open insurance claims and are willing to do whatever needed to close this so we can move forward with going to SNF rehab. However, they also tell me that they were told that this could take up to 7-10 days to happen. Therefore they worry they have no other options except to return home. They are frustrated and upset regarding how stressful this process has been for them. Dennis [name] is frustrated that she is constantly fighting to get help for Dennis [name] and is always running into road blocks when she is only trying to advocate for her husband.   I met again with Dennis [name]. I updated that I spoke with CSW and Dr. Doristine Bosworth and that CSW plans to call Medicare to find out more information regarding why this process would be delayed. CSW shares that in her experience that claims could be closed and patients move to SNF within one days time. I have no background knowledge on this process personally. I encouraged him to just wait and let us see how this processes throughout the day. He does show me a letter he has drafted and signed  to signify that he has no claim open as [employer] told them there was no open claim so they do not know what else to do to close claim. CSW to follow up with Dennis [name]. Dennis [name] expresses concern for Dennis [name] and how this situation, caring for himself, and caring for her father.   I tried to reach out to [name] x 2 prior to leaving today but no answer. I did not leave voicemail as I am leaving for today. My colleague, Kathie Rhodes NP, will be available as needed over the next couple days.   All questions/concerns addressed. Emotional support provided. Difficult situation.   Exam: Alert, oriented. No distress. Poor spirits due to placement situation. Breathing regular, unlabored. Abd soft. LBM 09/08/20. Moves all extremities.   Plan:  Needs SNF rehab. No safe for return home.   Gout pain just above right ankle to right anterior distal tibia: Improved with great relief from colchicine.   Chronic pain from low back and right knee (difficult to differentiate symptoms from back vs knee): ? Spinal stimulatorin place. ? Previously no relief from gabapentin or Tylenol. ? Fentanyl patch 50 mcg/hr 09/03/20. ? Oxycodone-acetaminophen 10-325 mg every 4 hours.  ? Continue lidoderm patches to low back and right knee.  ? Continue flexeril TID as needed.  ? Continue Cymbalta. ? Consider consulting Dr. Sherley Bounds as wife mentions that he has previously mentioned was seems like potentially nerve ablation.  Bowel Regimen:  ? LBM 09/08/20. ? Miralax daily. ? Senokot 2 tabs twice daily. ? Dulcolax suppository daily as needed.  35 min  Vinie Sill, NP Palliative Medicine Team Pager [phone] (Please see [email or web address] for schedule) Team Phone [phone]    Greater than 50%  of this time was spent counseling and coordinating care related to the above assessment and plan

## 2020-09-09 NOTE — Consult Note (Signed)
   [facility] CM Inpatient Consult   09/09/2020  Dennis Weller Zwick Sr. 1945/12/30 [id number]  [facility] [ACO] Patient: Showing as Dennis [name]   Patient screened for hospitalization with noted to be active with community Palliative Care with [name].    Plan:  Continue to follow progress and disposition to assess for post hospital care management needs for barriers to transition of care.    For questions contact:   Natividad Brood, RN BSN [facility] Liaison  [phone] business mobile phone Toll free office [phone]  Fax number: [phone] [name]

## 2020-09-09 NOTE — Progress Notes (Signed)
PROGRESS NOTE    Dennis Demelo Sr.  Dennis [name]:[id number] DOB: May 15, 1946 DOA: 09/01/2020 PCP: Dennis Noe, MD   Brief Narrative:  5 [name] with history of chronic diastolic CHF, hypertension, chronic hypogonadotrophic hypogonadism/Addison's disease, Parkinson's disease, PAF on anticoagulation, hypothyroidism, gout chronic right pain with osteoarthritis, chronic back pain with back stimulator x1 year presented with worsening right knee pain.  He was initially seen in the ED on 2/19 with severe right knee pain unable to ambulate at all, limited ROM.Knee tap was done at the time showed inflammatory changes. Cultures no growth.  Patient was seen by orthopedics. Seen by palliative care for pain management. At this time patient pain is well controlled PT suggested SNF-looking into placement   Assessment & Plan:  Severe right knee pain: - secondary to osteoarthritis  -S/P Rt knee tap on 2/19 no crystals culture negative and repeat aspiration and also cortisone injection  2/23- knee fluid culture no growth.  -Seen by palliative for pain management. At this time pain is well controlled cont on fentanyl patch 50 MCG, Cymbalta, lidocaine patch, opiates prn, muscle relaxant.  -PT recommended SNF - orthopedic recommended weightbearing as tolerated, up with PT as needed.  Okay to discharge from orthopedic standpoint-appreciate help.  History of gout:  -no crystal in the knee. On allopurinol  Chronic back pain/chronic pain with the stimulator in place.  -Pain management as above.  PAF: Rate controlled - continue on his Eliquis   CAD Chronic diastolic CHF Hypertension -No chest pain. BP is well controlled, CHF is compensated. Continue home Lasix and statin.  Hypopituitarism due to empty sella syndrome Chronic hypogonadotrophic hypogonadism Addison's disease/hypothyroidism -Remains  stable, will continue his home hydrocortisone twice daily, Synthroid.  Parkinson's disease: On  Sinemet  Morbid obesity w/ BMI 41:  Diet modification/exercise and weight loss recommended  Disposition: Patient stable for the discharge.  TOC on board to help with the SNF placement.    DVT prophylaxis: Eliquis Code Status: DNR Family Communication:  None present at bedside.  Plan of care discussed with patient in length and he verbalized understanding and agreed with it. Disposition Plan: SNF  Consultants:   Orthopedic surgery  Procedures:   Right knee arthrocentesis  Antimicrobials:   None  Status is: Inpatient   Dispo: The patient is from: Home              Anticipated d/c is to: SNF              Patient currently is medically stable to d/c.   Difficult to place patient No         Subjective: Patient seen and examined.  Appears frustrated regarding his insurance issues (he has open insurance claim from 2013 that needs to be closed before any SNF can accept him) denies any right knee pain, nausea, vomiting, chest pain or shortness of breath.  Objective: Vitals:   09/08/20 0448 09/08/20 1409 09/08/20 1940 09/09/20 0451  BP: 124/87 117/73 125/80 123/85  Pulse: 63 93 (!) 103 64  Resp: 18 19 20 16   Temp: 97.7 F (36.5 C) 98.1 F (36.7 C) 97.8 F (36.6 C) 97.8 F (36.6 C)  TempSrc: Oral Oral Oral Oral  SpO2: 100% 98% 100% 100%  Weight:      Height:        Intake/Output Summary (Last 24 hours) at 09/09/2020 1517 Last data filed at 09/09/2020 0752 Gross per 24 hour  Intake 840 ml  Output 825 ml  Net 15 ml  Filed Weights   09/02/20 0250  Weight: (!) 160 kg    Examination:  General exam: Appears calm and comfortable, on room air, obese, communicating well Respiratory system: Clear to auscultation. Respiratory effort normal. Cardiovascular system: S1 & S2 heard, RRR. No JVD, murmurs, rubs, gallops or clicks. No pedal edema. Gastrointestinal system: Abdomen is nondistended, soft and nontender. No organomegaly or masses felt. Normal bowel sounds  heard. Central nervous system: Alert and oriented. No focal neurological deficits. Extremities: Symmetric 5 x 5 power. Skin: No rashes, lesions or ulcers Psychiatry: Judgement and insight appear normal. Mood & affect appropriate.    Data Reviewed: I have personally reviewed following labs and imaging studies  CBC: No results for input(s): WBC, NEUTROABS, HGB, HCT, MCV, PLT in the last 168 hours. Basic Metabolic Panel: No results for input(s): NA, K, CL, CO2, GLUCOSE, BUN, CREATININE, CALCIUM, MG, PHOS in the last 168 hours. GFR: Estimated Creatinine Clearance: 109.9 mL/min (by C-G formula based on SCr of 0.98 mg/dL). Liver Function Tests: No results for input(s): AST, ALT, ALKPHOS, BILITOT, PROT, ALBUMIN in the last 168 hours. No results for input(s): LIPASE, AMYLASE in the last 168 hours. No results for input(s): AMMONIA in the last 168 hours. Coagulation Profile: No results for input(s): INR, PROTIME in the last 168 hours. Cardiac Enzymes: No results for input(s): CKTOTAL, CKMB, CKMBINDEX, TROPONINI in the last 168 hours. BNP (last 3 results) No results for input(s): PROBNP in the last 8760 hours. HbA1C: No results for input(s): HGBA1C in the last 72 hours. CBG: No results for input(s): GLUCAP in the last 168 hours. Lipid Profile: No results for input(s): CHOL, HDL, LDLCALC, TRIG, CHOLHDL, LDLDIRECT in the last 72 hours. Thyroid Function Tests: No results for input(s): TSH, T4TOTAL, FREET4, T3FREE, THYROIDAB in the last 72 hours. Anemia Panel: No results for input(s): VITAMINB12, FOLATE, FERRITIN, TIBC, IRON, RETICCTPCT in the last 72 hours. Sepsis Labs: No results for input(s): PROCALCITON, LATICACIDVEN in the last 168 hours.  Recent Results (from the past 240 hour(s))  SARS CORONAVIRUS 2 (TAT 6-24 HRS) Nasopharyngeal Nasopharyngeal Swab     Status: None   Collection Time: 09/01/20  5:18 PM   Specimen: Nasopharyngeal Swab  Result Value Ref Range Status   SARS Coronavirus  2 NEGATIVE NEGATIVE Final    Comment: (NOTE) SARS-CoV-2 target nucleic acids are NOT DETECTED.  The SARS-CoV-2 RNA is generally detectable in upper and lower respiratory specimens during the acute phase of infection. Negative results do not preclude SARS-CoV-2 infection, do not rule out co-infections with other pathogens, and should not be used as the sole basis for treatment or other patient management decisions. Negative results must be combined with clinical observations, patient history, and epidemiological information. The expected result is Negative.  Fact Sheet for Patients: SugarRoll.be  Fact Sheet for Healthcare Providers: https://www.woods-mathews.com/  This test is not yet approved or cleared by the [place] FDA and  has been authorized for detection and/or diagnosis of SARS-CoV-2 by FDA under an Emergency Use Authorization (EUA). This EUA will remain  in effect (meaning this test can be used) for the duration of the COVID-19 declaration under Se ction 564(b)(1) of the Act, 21 U.S.C. section 360bbb-3(b)(1), unless the authorization is terminated or revoked sooner.  Performed at [facility], [place] [address], [place], [place] [address]   Body fluid culture w Gram Stain     Status: None   Collection Time: 09/02/20  1:23 PM   Specimen: Body Fluid  Result Value Ref  Range Status   Specimen Description FLUID KNEE RIGHT  Final   Special Requests SYNOVIAL FLUID RIGHT KNEE  Final   Gram Stain   Final    ABUNDANT WBC PRESENT, PREDOMINANTLY PMN NO ORGANISMS SEEN    Culture   Final    NO GROWTH 3 DAYS Performed at [facility], [address] [place], [place] [address]    Report Status 09/05/2020 FINAL  Final  SARS CORONAVIRUS 2 (TAT 6-24 HRS) Nasopharyngeal Nasopharyngeal Swab     Status: None   Collection Time: 09/08/20 11:43 AM   Specimen: Nasopharyngeal Swab  Result Value Ref Range Status   SARS Coronavirus 2  NEGATIVE NEGATIVE Final    Comment: (NOTE) SARS-CoV-2 target nucleic acids are NOT DETECTED.  The SARS-CoV-2 RNA is generally detectable in upper and lower respiratory specimens during the acute phase of infection. Negative results do not preclude SARS-CoV-2 infection, do not rule out co-infections with other pathogens, and should not be used as the sole basis for treatment or other patient management decisions. Negative results must be combined with clinical observations, patient history, and epidemiological information. The expected result is Negative.  Fact Sheet for Patients: SugarRoll.be  Fact Sheet for Healthcare Providers: https://www.woods-mathews.com/  This test is not yet approved or cleared by the [place] FDA and  has been authorized for detection and/or diagnosis of SARS-CoV-2 by FDA under an Emergency Use Authorization (EUA). This EUA will remain  in effect (meaning this test can be used) for the duration of the COVID-19 declaration under Se ction 564(b)(1) of the Act, 21 U.S.C. section 360bbb-3(b)(1), unless the authorization is terminated or revoked sooner.  Performed at [facility], [place] [address] [place], [place] [address]       Radiology Studies: No results found.  Scheduled Meds: . allopurinol  450 mg Oral Daily  . apixaban  5 mg Oral BID  . carbidopa-levodopa  2 tablet Oral QID  . DULoxetine  60 mg Oral Daily  . fentaNYL  1 patch Transdermal Q72H  . furosemide  40 mg Oral Daily  . hydrocortisone  10 mg Oral Q supper  . hydrocortisone  20 mg Oral Daily  . levothyroxine  274 mcg Oral Q0600   And  . levothyroxine  137 mcg Oral Once per day on Thu  . lidocaine  2 patch Transdermal Q24H  . polyethylene glycol  17 g Oral Daily  . rosuvastatin  20 mg Oral Daily  . senna  2 tablet Oral BID  . sodium chloride flush  3 mL Intravenous Q12H  . topiramate  50 mg Oral BID   Continuous Infusions: .  sodium chloride       LOS: 7 days   Time spent: 35 minutes   Baily Hovanec Loann Quill, MD [facility]  If 7PM-7AM, please contact night-coverage [email or web address] 09/09/2020, 3:17 PM

## 2020-09-09 NOTE — TOC Progression Note (Signed)
Transition of Care (TOC) - Progression Note    Patient Details  Name: Dennis Mcclenahan Shiffer Sr. MRN: [id number] Date of Birth: January 16, 1946  Transition of Care [facility]) CM/SW [place], [place] Phone Number: 09/09/2020, 11:35 AM  Clinical Narrative:     CSW received call from [facility] admission coordinator stating that pt has an open insurance claim from 2013 with [employer] that needs to be closed. Coordinator stated she explained this to the wife over the phone.   CSW spoke to pt and bedside, wife was not present.Pt stated that wife called [employer] and the was told that the claim was over 86 years old and has fallen off their radar. Pt stated wife called medicare and was told that it can take up to 10 days to close the claim. Pt states he is worried about having to go home and requested csw call wife.   CSW spoke to [place] via phone. [name] stated she has consulted a lawyer and believes it is unfair that [facility] is making her close a claim from 9 years ago when [employer] has no record of it. [name] states she has faxed paperwork to medicare. CSW explained that closing an insurance claim is common practice among most SNF's. [name] continued to state it was unfair. [name] inquired about [employer] CSW informed her that she has called [facility] and is waiting on a call back. Wife asked if pt will still DC today. CSW explained it is up to the MD.  CSW discussed case with Olga Coaster [facility] supervisor.   Expected Discharge Plan: [place] Barriers to Discharge: Continued Medical Work up  Expected Discharge Plan and Services Expected Discharge Plan: [place] arrangements for the past 2 months: [place] Determinants of Health (SDOH) Interventions    Readmission Risk Interventions Readmission Risk Prevention Plan 02/24/2019  Transportation Screening  Complete  PCP or Specialist Appt within 5-7 Days Complete  Home Care Screening Complete  Medication Review (RN CM) Complete  Some recent data might be hidden   Emeterio Reeve, Latanya Presser, [place] Social Worker [phone]

## 2020-09-09 NOTE — Progress Notes (Signed)
Physical Therapy Treatment Patient Details Name: Dennis Sanzo Meloche Sr. MRN: [id number] DOB: 1945/10/16 Today's Date: 09/09/2020    History of Present Illness Pt is a 75 year old man admitted on 09/01/20 with severe R knee pain and inability to ambulate. Underwent aspiration of knee on 09/02/20. PMH: Parkinsons Disease, CHF, Addison's Disease, PAF, gout, back surgery.    PT Comments    Pt supine in bed on arrival.  He was present with wife at the bedside and reports he was already seen by PT and OT today.  PTA performed tx this pm as he was agreeable.  He complains of pain in R ankle this session.  Performed APs and ankle circles this session to improve ROM.  Educated to perform when in bed to improve circulation and decrease pain.  PTA applied cold pack to R ankle this session.  Pt continues to benefit from snf as he presents with poor safety and balance this session.  LOB x 2 noted during gt and transfers.     Follow Up Recommendations  SNF     Equipment Recommendations  None recommended by PT    Recommendations for Other Services       Precautions / Restrictions Precautions Precautions: Fall Precaution Comments: parkinsons, addisons Restrictions Weight Bearing Restrictions: No    Mobility  Bed Mobility Overal bed mobility: Needs Assistance Bed Mobility: Supine to Sit;Sit to Supine     Supine to sit: Min guard     General bed mobility comments: Pt with increased time to move into sitting and pulled into long sitting with heavy reliance on rails.  Pt then scooting to edge of bed to move through opening where bed rail did not cover.  Pt when moving back to bed let go of RW and presents with poor balance and falling inot seated position on bed.    Transfers Overall transfer level: Needs assistance Equipment used: Rolling walker (2 wheeled) Transfers: Sit to/from Stand Sit to Stand: Min guard;From elevated surface         General transfer comment: Bed placed in elevated  position to improve ease.  He refused physical assistance to rise into standing.  Pt pulling on RW during sit to stand.  LOB returning to seated surface as he let go of RW and reaching for rail on bed falling into seated position.  Ambulation/Gait Ambulation/Gait assistance: Min guard;Min assist Gait Distance (Feet): 120 Feet Assistive device: Rolling walker (2 wheeled) Gait Pattern/deviations: Step-through pattern;Trunk flexed     General Gait Details: Cues for scap retraction and upper trunk control.  LOB x 1 when taking hands off RW to adjust mask.  He required cues to keep B hands on RW when device is moving and to stop completely if he needed to remove his hand for any reason.   Stairs             Wheelchair Mobility    Modified Rankin (Stroke Patients Only)       Balance Overall balance assessment: Needs assistance Sitting-balance support: Feet supported Sitting balance-Leahy Scale: Fair       Standing balance-Leahy Scale: Poor                              Cognition Arousal/Alertness: Awake/alert Behavior During Therapy: Impulsive Overall Cognitive Status: Impaired/Different from baseline Area of Impairment: Safety/judgement;Following commands;Problem solving  Following Commands: Follows one step commands inconsistently;Follows one step commands with increased time     Problem Solving: Slow processing;Requires verbal cues;Requires tactile cues General Comments: Pt with poor safety leaving device behind at times and presents with LOB when safety issues arrise.      Exercises General Exercises - Lower Extremity Ankle Circles/Pumps: AROM;Both;10 reps;Supine (x10 ankle circles this session as well.  Decreased ROM in R ankle.) Quad Sets: AROM;Both;10 reps;Supine Heel Slides: AROM;Both;10 reps;Supine;Limitations Heel Slides Limitations: decreased ROM on R LE. Hip ABduction/ADduction: AROM;Both;10 reps;Supine Straight  Leg Raises: AROM;Both;10 reps;Supine    General Comments        Pertinent Vitals/Pain Pain Assessment: 0-10 Pain Score: 2  Pain Location: R ankle and R knee with flexion. Pain Descriptors / Indicators: Aching;Grimacing;Guarding;Discomfort;Tender Pain Intervention(s): Monitored during session;Repositioned;Ice applied    Home Living                      Prior Function            PT Goals (current goals can now be found in the care plan section) Acute Rehab PT Goals Patient Stated Goal: go to rehab, pain relief Potential to Achieve Goals: Fair Progress towards PT goals: Progressing toward goals    Frequency    Min 2X/week      PT Plan Current plan remains appropriate    Co-evaluation              AM-PAC PT "6 Clicks" Mobility   Outcome Measure  Help needed turning from your back to your side while in a flat bed without using bedrails?: A Little Help needed moving from lying on your back to sitting on the side of a flat bed without using bedrails?: A Little Help needed moving to and from a bed to a chair (including a wheelchair)?: A Little Help needed standing up from a chair using your arms (e.g., wheelchair or bedside chair)?: A Lot Help needed to walk in hospital room?: A Little Help needed climbing 3-5 steps with a railing? : Total 6 Click Score: 15    End of Session Equipment Utilized During Treatment: Gait belt Activity Tolerance: Patient limited by fatigue;Treatment limited secondary to agitation Patient left: in chair;with call bell/phone within reach;with chair alarm set Nurse Communication: Mobility status PT Visit Diagnosis: Unsteadiness on feet (R26.81);Muscle weakness (generalized) (M62.81);Other abnormalities of gait and mobility (R26.89)     Time: 4235-3614 PT Time Calculation (min) (ACUTE ONLY): 29 min  Charges:  $Gait Training: 8-22 mins $Therapeutic Exercise: 8-22 mins                     Erasmo Leventhal , PTA Acute Rehabilitation  Services Pager [phone] Office [phone]     Aimee Eli Hose 09/09/2020, 5:59 PM

## 2020-09-09 NOTE — Plan of Care (Signed)

## 2020-09-09 NOTE — Progress Notes (Signed)
     Subjective: Patient happy with pain control and ambulation today but frustrated as he does not know what next steps will be regarding discharge. No pain at right knee today, mild pain with movement at right ankle.    Objective:  PE: VITALS:   Vitals:   09/08/20 0448 09/08/20 1409 09/08/20 1940 09/09/20 0451  BP: 124/87 117/73 125/80 123/85  Pulse: 63 93 (!) 103 64  Resp: 18 19 20 16   Temp: 97.7 F (36.5 C) 98.1 F (36.7 C) 97.8 F (36.6 C) 97.8 F (36.6 C)  TempSrc: Oral Oral Oral Oral  SpO2: 100% 98% 100% 100%  Weight:      Height:       General: Sitting up in bed, in no acute distress MSK: Able to perform a straight leg raise. Approx 5-70 deg of AROM of knee. Dorsiflexion and Plantarflexion intact. Distal sensation intact. + DP pulse. No TTP to any aspect of knee. Mildly tender to lateral malleolus of ankle. No TTP to anterior tibia. Mild edema at bilateral ankles.   LABS  No results found for this or any previous visit (from the past 24 hour(s)).  No results found.  Assessment/Plan: Acute flare of right knee osteoarthritis: - both aspirations came back negative for organisms or crystals - patient has known osteoarthritis in his right knee and has had multiple arthritic flares requiring a cortisone injection, performed cortisone injection 2/23 - making steady improvement with pain and ROM since cortisone injection  - ok to WBAT RLE, up with PT as needed  Right ankle pain, possible gout flare: - Mild edema at bilateral ankles - repeat x-rays taken 09/04/20 show no bony abnormalities - patient started on round of colchicine over the weekend for pain and swelling patient feels like pain has improved since start of colchicine, continue home allopurinol as well  Dispo: PT recommending SNF. TOC on board. Ok to discharge from ortho standpoint when bed found.   Contact information:   Weekdays 8-5 [address], [place] [phone] A fter hours and holidays please check  [email or web address] for group call information for [facility] [place] 09/09/2020, 12:50 PM

## 2020-09-10 DIAGNOSIS — M6259 Muscle wasting and atrophy, not elsewhere classified, multiple sites: Secondary | ICD-10-CM | POA: Diagnosis not present

## 2020-09-10 DIAGNOSIS — F329 Major depressive disorder, single episode, unspecified: Secondary | ICD-10-CM | POA: Diagnosis not present

## 2020-09-10 DIAGNOSIS — I4891 Unspecified atrial fibrillation: Secondary | ICD-10-CM | POA: Diagnosis not present

## 2020-09-10 DIAGNOSIS — I10 Essential (primary) hypertension: Secondary | ICD-10-CM | POA: Diagnosis not present

## 2020-09-10 DIAGNOSIS — E236 Other disorders of pituitary gland: Secondary | ICD-10-CM | POA: Diagnosis not present

## 2020-09-10 DIAGNOSIS — I5032 Chronic diastolic (congestive) heart failure: Secondary | ICD-10-CM | POA: Diagnosis not present

## 2020-09-10 DIAGNOSIS — I480 Paroxysmal atrial fibrillation: Secondary | ICD-10-CM

## 2020-09-10 DIAGNOSIS — M25561 Pain in right knee: Secondary | ICD-10-CM | POA: Diagnosis not present

## 2020-09-10 DIAGNOSIS — E038 Other specified hypothyroidism: Secondary | ICD-10-CM | POA: Diagnosis not present

## 2020-09-10 DIAGNOSIS — E2740 Unspecified adrenocortical insufficiency: Secondary | ICD-10-CM | POA: Diagnosis not present

## 2020-09-10 DIAGNOSIS — G20 Parkinson's disease: Secondary | ICD-10-CM | POA: Diagnosis not present

## 2020-09-10 DIAGNOSIS — I6789 Other cerebrovascular disease: Secondary | ICD-10-CM | POA: Diagnosis not present

## 2020-09-10 DIAGNOSIS — R269 Unspecified abnormalities of gait and mobility: Secondary | ICD-10-CM | POA: Diagnosis not present

## 2020-09-10 DIAGNOSIS — M1990 Unspecified osteoarthritis, unspecified site: Secondary | ICD-10-CM | POA: Diagnosis not present

## 2020-09-10 DIAGNOSIS — Z7401 Bed confinement status: Secondary | ICD-10-CM | POA: Diagnosis not present

## 2020-09-10 DIAGNOSIS — I2510 Atherosclerotic heart disease of native coronary artery without angina pectoris: Secondary | ICD-10-CM | POA: Diagnosis not present

## 2020-09-10 DIAGNOSIS — M2550 Pain in unspecified joint: Secondary | ICD-10-CM | POA: Diagnosis not present

## 2020-09-10 DIAGNOSIS — G4733 Obstructive sleep apnea (adult) (pediatric): Secondary | ICD-10-CM | POA: Diagnosis not present

## 2020-09-10 DIAGNOSIS — R41841 Cognitive communication deficit: Secondary | ICD-10-CM | POA: Diagnosis not present

## 2020-09-10 DIAGNOSIS — E230 Hypopituitarism: Secondary | ICD-10-CM

## 2020-09-10 DIAGNOSIS — M25461 Effusion, right knee: Secondary | ICD-10-CM | POA: Diagnosis not present

## 2020-09-10 DIAGNOSIS — R2689 Other abnormalities of gait and mobility: Secondary | ICD-10-CM | POA: Diagnosis not present

## 2020-09-10 DIAGNOSIS — I509 Heart failure, unspecified: Secondary | ICD-10-CM | POA: Diagnosis not present

## 2020-09-10 DIAGNOSIS — G8929 Other chronic pain: Secondary | ICD-10-CM | POA: Diagnosis not present

## 2020-09-10 DIAGNOSIS — E271 Primary adrenocortical insufficiency: Secondary | ICD-10-CM | POA: Diagnosis not present

## 2020-09-10 DIAGNOSIS — M6281 Muscle weakness (generalized): Secondary | ICD-10-CM | POA: Diagnosis not present

## 2020-09-10 DIAGNOSIS — M2391 Unspecified internal derangement of right knee: Secondary | ICD-10-CM | POA: Diagnosis not present

## 2020-09-10 DIAGNOSIS — E7800 Pure hypercholesterolemia, unspecified: Secondary | ICD-10-CM

## 2020-09-10 DIAGNOSIS — R2681 Unsteadiness on feet: Secondary | ICD-10-CM | POA: Diagnosis not present

## 2020-09-10 DIAGNOSIS — E039 Hypothyroidism, unspecified: Secondary | ICD-10-CM | POA: Diagnosis not present

## 2020-09-10 DIAGNOSIS — R0902 Hypoxemia: Secondary | ICD-10-CM | POA: Diagnosis not present

## 2020-09-10 MED ORDER — FENTANYL 50 MCG/HR TD PT72: 1.0000 | MEDICATED_PATCH | TRANSDERMAL | 0 refills | Status: DC

## 2020-09-10 MED ORDER — POLYETHYLENE GLYCOL 3350 17 G PO PACK: 17.0000 g | PACK | Freq: Every day | ORAL | 0 refills | Status: AC

## 2020-09-10 MED ORDER — SENNA 8.6 MG PO TABS: 1.0000 | ORAL_TABLET | Freq: Two times a day (BID) | ORAL | 0 refills | Status: AC

## 2020-09-10 NOTE — Progress Notes (Signed)
Palliative Medicine RN Note: Dennis [name] called the PMT office to ask about his fentanyl rx. He was able to locate it in his d/c packet, and he asked how he was supposed to fill it. I called SW Miranda to let her know about his questions.  Marjie Skiff Henslee, RN, BSN, [facility] Palliative Medicine Team 09/10/2020 2:52 PM Office [phone]

## 2020-09-10 NOTE — Discharge Summary (Signed)
Physician Discharge Summary  Dennis Duthie Holsclaw Sr. BTD:[id number] DOB: Dec 16, 1945 DOA: 09/01/2020  PCP: [name], MD  Admit date: 09/01/2020 Discharge date: 09/10/2020  Admitted From: Home Disposition:  [facility]  Recommendations for Outpatient Follow-up:  1. Follow up with PCP in 1-2 weeks 2. Follow-up with orthopedic surgery in 2 weeks  Home Health: None Equipment/Devices: None Discharge Condition: Stable CODE STATUS: DNR Diet recommendation: Heart healthy diet  Brief/Interim Summary: 75 y/o male with history of chronic diastolic CHF, hypertension, chronic hypogonadotrophic hypogonadism/Addison's disease, Parkinson's disease, PAF on anticoagulation, hypothyroidism, gout chronic right pain with osteoarthritis, chronic back pain with back stimulator x1 year presented with worsening right knee pain. He was initially seen in the ED on 2/19 with severe right knee pain unable to ambulate at all, limited ROM.Knee tap was done at the time showed inflammatory changes. Cultures no growth.  Orthopedic surgery and palliative care consulted.  Patient admitted for further evaluation and management.  Severe right knee pain: -secondary to osteoarthritis -S/P Rtknee tap on 2/19 no crystals culture negative and repeat aspiration and also cortisone injection 2/23- knee fluid culture no growth. -Seen by palliative for pain management. At this time pain is well controlled cont'd on fentanyl patch50 MCG, Cymbalta,lidocaine patch,opiatesprn,muscle relaxant. -PT recommended SNF -His symptoms has improved.  Orthopedic recommended weightbearing as tolerated, up with PT as needed.  Okay to discharge from orthopedic standpoint-appreciate help.  History of gout:  -no crystal in the knee. Continued allopurinol and colchicine  Chronic back pain/chronic painwith the stimulator in place. -Pain management as above.  PAF: Rate controlled -continued on hisEliquis   CAD Chronic diastolic  CHF Hypertension -No chest pain.BP is well controlled,CHF is compensated. Continued homeLasix and statin.  Hypopituitarism due to empty sella syndrome Chronic hypogonadotrophic hypogonadism Addison's disease/hypothyroidism -Remained  stable, continued his homehydrocortisone twice daily, Synthroid.  Parkinson's disease:Remained stable onSinemet  Morbid obesity w/ BMI 41: Diet modification/exercise and weight loss recommended  Patient stable for the discharge to nursing home today.  Follow-up with orthopedic surgery outpatient recommended.  Discharge Diagnoses:  Severe right knee pain secondary to osteoarthritis History of gout Chronic back pain/chronic pain syndrome Paroxysmal A. fib Coronary artery disease Chronic diastolic CHF Hypertension Hypopituitarism due to empty sella syndrome Chronic hypogonadotrophic hypogonadism Addison's disease/hypothyroidism Parkinson's disease Morbid obesity with BMI of 41  Discharge Instructions  Discharge Instructions    Discharge instructions   Complete by: As directed    Follow up with PCP in 1 week Follow up with orthopedic surgery as an outpatient   Increase activity slowly   Complete by: As directed      Allergies as of 09/10/2020      Reactions   Bee Venom Anaphylaxis   Shrimp [shellfish Allergy] Anaphylaxis, Other (See Comments)   "just the protein in the shrimp"   Stadol [butorphanol] Anaphylaxis, Shortness Of Breath, Other (See Comments)   Respiratory distress, couldn't breathe, cardiac arrest   Wasp Venom Anaphylaxis   Other Other (See Comments)   Protein supplement - unknown      Medication List    TAKE these medications   allopurinol 300 MG tablet Commonly known as: ZYLOPRIM Take 450 mg by mouth daily as needed.   apixaban 5 MG Tabs tablet Commonly known as: Eliquis Take 1 tablet (5 mg total) by mouth 2 (two) times daily.   carbidopa-levodopa 25-250 MG tablet Commonly known as: SINEMET IR TAKE 2  TABLETS BY MOUTH 4  TIMES DAILY What changed: See the new instructions.   colchicine  0.6 MG tablet TAKE 1 TABLET BY MOUTH TWICE DAILY What changed:   when to take this  reasons to take this   cyclobenzaprine 5 MG tablet Commonly known as: FLEXERIL Take 5 mg by mouth 3 (three) times daily as needed for muscle spasms.   DULoxetine 60 MG capsule Commonly known as: CYMBALTA Take 1 capsule (60 mg total) by mouth daily.   EPINEPHrine 0.3 mg/0.3 mL Soaj injection Commonly known as: EPI-PEN Inject 0.3 mg into the muscle as needed (allergic reaction). What changed:   when to take this  reasons to take this   fentaNYL 50 MCG/HR Commonly known as: Parkman 1 patch onto the skin every 3 (three) days. Start taking on: September 12, 2020   furosemide 40 MG tablet Commonly known as: LASIX TAKE 1 TABLET(40 MG) BY MOUTH DAILY What changed: See the new instructions.   hydrocortisone 10 MG tablet Commonly known as: CORTEF TAKE 2 TABLETS BY MOUTH EVERY MORNING AND 1 TABLET AT 5 PM EVERY DAY What changed:   how much to take  how to take this  when to take this  additional instructions   levothyroxine 137 MCG tablet Commonly known as: SYNTHROID TAKE 2 TABLETS BY MOUTH  DAILY BEFORE BREAKFAST AND TAKE 1 EXTRA TABLET WEEKLY What changed:   how much to take  how to take this  when to take this  additional instructions   lidocaine 5 % Commonly known as: LIDODERM APPLY 1 PATCH TO SKIN EVERY DAY. REMOVE AND DISCARD AFTER 12 HOURS OR AS DIRECTED BY MD What changed:   how much to take  how to take this  when to take this  additional instructions   multivitamin with minerals Tabs tablet Take 1 tablet by mouth daily.   NEEDLE (DISP) 22 G 22G X 1" Misc Use every 14 days   oxyCODONE-acetaminophen 10-325 MG tablet Commonly known as: PERCOCET Take 1 tablet by mouth every 4 (four) hours as needed for pain.   OxyCONTIN 20 mg 12 hr tablet Generic drug:  oxyCODONE Take 20 mg by mouth every 12 (twelve) hours.   polyethylene glycol 17 g packet Commonly known as: MIRALAX / GLYCOLAX Take 17 g by mouth daily. Start taking on: September 11, 2020   potassium chloride 10 MEQ tablet Commonly known as: KLOR-CON Take 1 tablet (10 mEq total) by mouth daily.   predniSONE 10 MG tablet Commonly known as: DELTASONE Take 2 tablets (20 mg total) by mouth daily.   rosuvastatin 20 MG tablet Commonly known as: CRESTOR Take 1 tablet (20 mg total) by mouth daily.   senna 8.6 MG Tabs tablet Commonly known as: SENOKOT Take 1 tablet (8.6 mg total) by mouth 2 (two) times daily.   SYRINGE 3CC/21GX1" 21G X 1" 3 ML Misc Use every 14 days   testosterone cypionate 200 MG/ML injection Commonly known as: Depo-Testosterone Inject 0.6 mLs (120 mg total) into the muscle every 14 (fourteen) days. What changed: how much to take   topiramate 50 MG tablet Commonly known as: TOPAMAX TAKE 1 TABLET BY MOUTH  TWICE DAILY   Xtampza ER 36 MG C12a Generic drug: oxyCODONE ER Take 1 capsule by mouth every 12 (twelve) hours.       Follow-up Information    Bashore, Gwenyth Allegra., MD. Schedule an appointment as soon as possible for a visit in 2 week(s).   Specialty: Sports Medicine Why: Follow up with Dr. Michaelle Birks or with Dr. Mardelle Matte at [facility] in 2 weeks  [name], MD Follow up in 1 week(s).   Specialty: Family Medicine Contact information: [place] [address] [phone]              Allergies  Allergen Reactions   Bee Venom Anaphylaxis   Shrimp [Shellfish Allergy] Anaphylaxis and Other (See Comments)    "just the protein in the shrimp"   Stadol [Butorphanol] Anaphylaxis, Shortness Of Breath and Other (See Comments)    Respiratory distress, couldn't breathe, cardiac arrest   Wasp Venom Anaphylaxis   Other Other (See Comments)    Protein supplement - unknown    Consultations:  Orthopedic  surgery  Palliative care   Procedures/Studies: DG Chest 2 View  Result Date: 09/01/2020 CLINICAL DATA:  Hypoxemia. EXAM: CHEST - 2 VIEW COMPARISON:  02/21/2019 FINDINGS: Hypoventilation. Decreased lung volume with mild atelectasis in the bases. Negative for heart failure or pneumonia. No pleural effusion. Spinal cord stimulator midthoracic spine has been placed in the interval. Cardiac loop recorder has been removed. IMPRESSION: Hypoventilation with bibasilar atelectasis. Electronically Signed   By: Franchot Gallo M.D.   On: 09/01/2020 15:02   DG Ankle 2 Views Right  Result Date: 09/04/2020 CLINICAL DATA:  Pain in the LATERAL side of the ankle. No known injury. EXAM: RIGHT ANKLE - 2 VIEW COMPARISON:  None. FINDINGS: Mild degenerative changes are identified at the MEDIAL and LATERAL malleolus. No acute fracture or subluxation. Small plantar calcaneal spur. IMPRESSION: No evidence for acute abnormality. Electronically Signed   By: Nolon Nations M.D.   On: 09/04/2020 16:43   CT Head Wo Contrast  Result Date: 09/01/2020 CLINICAL DATA:  Previous history of stroke right hip pain EXAM: CT HEAD WITHOUT CONTRAST TECHNIQUE: Contiguous axial images were obtained from the base of the skull through the vertex without intravenous contrast. COMPARISON:  MRI 06/27/2017, 08/30/2017, CT brain 06/27/2017 FINDINGS: Brain: No acute territorial infarction, hemorrhage, or intracranial mass is visualized. Chronic lacunar infarcts within the left basal ganglia and white matter. Mild to moderate chronic small vessel ischemic change of the white matter. Moderate atrophy. Stable ventricle size. Vascular: No hyperdense vessels. Vertebral and carotid vascular calcification Skull: Normal. Negative for fracture or focal lesion. Sinuses/Orbits: Mucosal thickening in the sinuses Other: None IMPRESSION: 1. No CT evidence for acute intracranial abnormality. 2. Atrophy and chronic small vessel ischemic changes of the white matter.  Chronic lacunar infarcts in the left basal ganglia and white matter. Electronically Signed   By: Donavan Foil M.D.   On: 09/01/2020 18:47   CT L-SPINE NO CHARGE  Result Date: 09/01/2020 CLINICAL DATA:  Sciatica and chronic pain to right hip and right knee, progressive. EXAM: CT LUMBAR SPINE WITHOUT CONTRAST TECHNIQUE: Multidetector CT imaging of the lumbar spine was performed without intravenous contrast administration. Multiplanar CT image reconstructions were also generated. COMPARISON:  MRI lumbar spine 02/22/2019. FINDINGS: Segmentation: Inferior-most fully formed intervertebral disc is labeled L5-S1. Alignment: No substantial subluxation. Vertebrae: L2-L5 PLIF. Evidence of osseous fusion across L2-L3, L3-L4, and L4-L5. Vertebral body heights are maintained. No evidence of acute fracture. Paraspinal and other soft tissues: Calcific atherosclerosis of the aorta. Partially imaged, spinal cord stimulator which enters the canal at T11-T12 dorsally. Disc levels: At L2-L3 there is focal right subarticular bony spurring, which may affect the descending right L3 nerve root and was demonstrated on prior MRI. Multilevel facet hypertrophy. Limited evaluation of the canal given absence of contrast and streak artifact from fusion IMPRESSION: 1. No evidence of acute fracture or traumatic  malalignment. 2. At L2-L3 there is focal right subarticular bony spurring, which may affect the descending right L3 nerve root and was demonstrated on prior MRI. 3. Limited evaluation of the canal due to streak artifact from hardware and absence of contrast. An MRI or CT myelogram could further characterize the canal/cord/foramina if clinically indicated. Electronically Signed   By: Margaretha Sheffield MD   On: 09/01/2020 18:45   DG Knee Complete 4 Views Right  Result Date: 08/29/2020 CLINICAL DATA:  Chronic right knee pain.  No known injury. EXAM: RIGHT KNEE - COMPLETE 4+ VIEW COMPARISON:  None. FINDINGS: There is no acute bony or  joint abnormality. Chondrocalcinosis is seen. Mild medial compartment joint space narrowing is present there is some osteophytosis about the knee. Small to moderate joint effusion. IMPRESSION: Mild to moderate appearing osteoarthritis. Chondrocalcinosis. Small to moderate joint effusion. Electronically Signed   By: Inge Rise M.D.   On: 08/29/2020 16:36   DG FLUORO GUIDED NEEDLE PLC ASPIRATION/INJECTION LOC  Result Date: 08/29/2020 CLINICAL DATA:  Swollen painful right knee. Clinical concern for joint infection. EXAM: RIGHT KNEE ASPIRATION UNDER FLUOROSCOPY COMPARISON:  Knee x-ray from earlier the same day. FLUOROSCOPY TIME:  Fluoroscopy Time:  0 minutes and 18 seconds. Radiation Exposure Index (if provided by the fluoroscopic device): 0.9 MGY Number of Acquired Spot Images: 0 PROCEDURE: Risk, benefits, and alternatives of the procedure were discussed with the patient. Emphasized risks included bleeding, infection, damage to adjacent organs, and inability to access the joint space. Patient was given an opportunity to ask questions and noted that he has had this procedure done multiple times previously. Written informed consent was obtained. Prior to beginning the procedure, a time-out was performed. Using fluoroscopic guidance, appropriate skin site was identified superolateral to the patella and then prepped with Betadine, draped in the usual sterile fashion, and infiltrated locally with buffered Lidocaine. The needle was advanced deep to the patella into the joint space. Clear yellowish fluid was aspirated. A total of approximately 85 cc joint fluid was aspirated during the procedure. The first 20 cc obtained were divided into 10 mm aliquots and sent to the laboratory for appropriate analysis. IMPRESSION: Technically successful right knee aspiration with approximately 85 cc of joint fluid removed. Patient stated that he felt better after the exam with less pain and improved ability to move his right  foot. No evidence for immediate postprocedure complications. Electronically Signed   By: Misty Stanley M.D.   On: 08/29/2020 19:51   CT Renal Stone Study  Result Date: 09/01/2020 CLINICAL DATA:  Right flank pain EXAM: CT ABDOMEN AND PELVIS WITHOUT CONTRAST TECHNIQUE: Multidetector CT imaging of the abdomen and pelvis was performed following the standard protocol without IV contrast. COMPARISON:  CT 02/08/2018 FINDINGS: Lower chest: Lung bases demonstrate no acute consolidation or effusion. Bilateral gynecomastia. Coronary vascular calcification. Hepatobiliary: No focal liver abnormality is seen. No gallstones, gallbladder wall thickening, or biliary dilatation. Pancreas: Unremarkable. No pancreatic ductal dilatation or surrounding inflammatory changes. Spleen: Normal in size without focal abnormality. Adrenals/Urinary Tract: Adrenal glands are unremarkable. Kidneys show no hydronephrosis. Negative for ureteral stone. Bladder is unremarkable Stomach/Bowel: Stomach is within normal limits. Appendix appears normal. No evidence of bowel wall thickening, distention, or inflammatory changes. Vascular/Lymphatic: Mild aortic atherosclerosis. No aneurysm. No suspicious nodes Reproductive: Negative for mass. Other: Negative for free air or free fluid. Right greater than left fat containing inguinal hernias. Musculoskeletal: Orthopedic hardware L2 through L5, see separately dictated lumbar spine CT report. Ascending thoracic stimulator leads  terminating at T7-T8. IMPRESSION: 1. Negative for hydronephrosis or ureteral stone. 2. No CT evidence for acute intra-abdominal or pelvic abnormality. Aortic Atherosclerosis (ICD10-I70.0). Electronically Signed   By: Donavan Foil M.D.   On: 09/01/2020 19:07   DG Hip Unilat W or Wo Pelvis 2-3 Views Right  Result Date: 08/29/2020 CLINICAL DATA:  Chronic right hip pain.  No known injury. EXAM: DG HIP (WITH OR WITHOUT PELVIS) 2-3V RIGHT COMPARISON:  None. FINDINGS: There is no acute  or focal bony or joint abnormality. The patient has left much worse than right appearing hip osteoarthritis. Postoperative change of lower lumbar fusion noted. IMPRESSION: No acute finding. Left much worse than right hip osteoarthritis. Electronically Signed   By: Inge Rise M.D.   On: 08/29/2020 16:34   VAS [name] LOWER EXTREMITY VENOUS (DVT) (ONLY [facility] & [facility])  Result Date: 09/02/2020  Lower Venous DVT Study Indications: RLE pain.  Risk Factors: HX of arthritis and sciatica. Comparison Study: Last exam was on 07/16/18 - negative Performing Technologist: Rogelia Rohrer  Examination Guidelines: A complete evaluation includes B-mode imaging, spectral Doppler, color Doppler, and power Doppler as needed of all accessible portions of each vessel. Bilateral testing is considered an integral part of a complete examination. Limited examinations for reoccurring indications may be performed as noted. The reflux portion of the exam is performed with the patient in reverse Trendelenburg.  +---------+---------------+---------+-----------+----------+--------------+  RIGHT     Compressibility Phasicity Spontaneity Properties Thrombus Aging  +---------+---------------+---------+-----------+----------+--------------+  CFV       Full            Yes       Yes                                    +---------+---------------+---------+-----------+----------+--------------+  SFJ       Full                                                             +---------+---------------+---------+-----------+----------+--------------+  FV Prox   Full            Yes       Yes                                    +---------+---------------+---------+-----------+----------+--------------+  FV Mid    Full            Yes       Yes                                    +---------+---------------+---------+-----------+----------+--------------+  FV Distal Full            Yes       Yes                                     +---------+---------------+---------+-----------+----------+--------------+  PFV       Full                                                             +---------+---------------+---------+-----------+----------+--------------+  POP       Full            Yes       Yes                                    +---------+---------------+---------+-----------+----------+--------------+  PTV       Full                                                             +---------+---------------+---------+-----------+----------+--------------+  PERO      Full                                                             +---------+---------------+---------+-----------+----------+--------------+   +----+---------------+---------+-----------+----------+--------------+  LEFT Compressibility Phasicity Spontaneity Properties Thrombus Aging  +----+---------------+---------+-----------+----------+--------------+  CFV  Full            Yes       Yes                                    +----+---------------+---------+-----------+----------+--------------+     Summary: RIGHT: - There is no evidence of deep vein thrombosis in the lower extremity. - There is no evidence of superficial venous thrombosis.  - No cystic structure found in the popliteal fossa.  LEFT: - No evidence of common femoral vein obstruction.  *See table(s) above for measurements and observations. Electronically signed by Deitra Mayo MD on 09/02/2020 at 5:58:03 AM.    Final       Subjective: Patient seen and examined.  Resting comfortably on the bed.  No new complaints.  Reports improvement in pain and swelling in right leg and foot.  No fever, chills, nausea, vomiting, chest pain or shortness of breath.  Stable for the discharge.  Discharge Exam: Vitals:   09/09/20 2051 09/10/20 0535  BP: 112/77 128/89  Pulse: 70 65  Resp: 16 17  Temp: 98.3 F (36.8 C) (!) 97.3 F (36.3 C)  SpO2: 98% 99%   Vitals:   09/09/20 0451 09/09/20 1835 09/09/20 2051 09/10/20  0535  BP: 123/85 117/80 112/77 128/89  Pulse: 64 84 70 65  Resp: 16 18 16 17   Temp: 97.8 F (36.6 C) 98.4 F (36.9 C) 98.3 F (36.8 C) (!) 97.3 F (36.3 C)  TempSrc: Oral  Oral Oral  SpO2: 100% 100% 98% 99%  Weight:      Height:        General: Pt is alert, awake, not in acute distress, obese, on room air, communicating well Cardiovascular: RRR, S1/S2 +, no rubs, no gallops Respiratory: CTA bilaterally, no wheezing, no rhonchi Abdominal: Soft, NT, ND, bowel sounds + Extremities: no edema, no cyanosis    The results of significant diagnostics from this hospitalization (including imaging, microbiology, ancillary and laboratory) are listed below for reference.     Microbiology: Recent Results (from the past 240 hour(s))  SARS CORONAVIRUS 2 (TAT 6-24 HRS) Nasopharyngeal Nasopharyngeal Swab  Status: None   Collection Time: 09/01/20  5:18 PM   Specimen: Nasopharyngeal Swab  Result Value Ref Range Status   SARS Coronavirus 2 NEGATIVE NEGATIVE Final    Comment: (NOTE) SARS-CoV-2 target nucleic acids are NOT DETECTED.  The SARS-CoV-2 RNA is generally detectable in upper and lower respiratory specimens during the acute phase of infection. Negative results do not preclude SARS-CoV-2 infection, do not rule out co-infections with other pathogens, and should not be used as the sole basis for treatment or other patient management decisions. Negative results must be combined with clinical observations, patient history, and epidemiological information. The expected result is Negative.  Fact Sheet for Patients: SugarRoll.be  Fact Sheet for Healthcare Providers: https://www.woods-mathews.com/  This test is not yet approved or cleared by the [place] FDA and  has been authorized for detection and/or diagnosis of SARS-CoV-2 by FDA under an Emergency Use Authorization (EUA). This EUA will remain  in effect (meaning this test can be used)  for the duration of the COVID-19 declaration under Se ction 564(b)(1) of the Act, 21 U.S.C. section 360bbb-3(b)(1), unless the authorization is terminated or revoked sooner.  Performed at [facility], [place] [address], [place], [place] [address]   Body fluid culture w Gram Stain     Status: None   Collection Time: 09/02/20  1:23 PM   Specimen: Body Fluid  Result Value Ref Range Status   Specimen Description FLUID KNEE RIGHT  Final   Special Requests SYNOVIAL FLUID RIGHT KNEE  Final   Gram Stain   Final    ABUNDANT WBC PRESENT, PREDOMINANTLY PMN NO ORGANISMS SEEN    Culture   Final    NO GROWTH 3 DAYS Performed at [facility], [place] [address], [place], [place] [address]    Report Status 09/05/2020 FINAL  Final  SARS CORONAVIRUS 2 (TAT 6-24 HRS) Nasopharyngeal Nasopharyngeal Swab     Status: None   Collection Time: 09/08/20 11:43 AM   Specimen: Nasopharyngeal Swab  Result Value Ref Range Status   SARS Coronavirus 2 NEGATIVE NEGATIVE Final    Comment: (NOTE) SARS-CoV-2 target nucleic acids are NOT DETECTED.  The SARS-CoV-2 RNA is generally detectable in upper and lower respiratory specimens during the acute phase of infection. Negative results do not preclude SARS-CoV-2 infection, do not rule out co-infections with other pathogens, and should not be used as the sole basis for treatment or other patient management decisions. Negative results must be combined with clinical observations, patient history, and epidemiological information. The expected result is Negative.  Fact Sheet for Patients: SugarRoll.be  Fact Sheet for Healthcare Providers: https://www.woods-mathews.com/  This test is not yet approved or cleared by the [place] FDA and  has been authorized for detection and/or diagnosis of SARS-CoV-2 by FDA under an Emergency Use Authorization (EUA). This EUA will remain  in effect (meaning this test can be used)  for the duration of the COVID-19 declaration under Se ction 564(b)(1) of the Act, 21 U.S.C. section 360bbb-3(b)(1), unless the authorization is terminated or revoked sooner.  Performed at [facility], [place] [address] [place], [place] [address]      Labs: BNP (last 3 results) No results for input(s): BNP in the last 8760 hours. Basic Metabolic Panel: No results for input(s): NA, K, CL, CO2, GLUCOSE, BUN, CREATININE, CALCIUM, MG, PHOS in the last 168 hours. Liver Function Tests: No results for input(s): AST, ALT, ALKPHOS, BILITOT, PROT, ALBUMIN in the last 168 hours. No results for  input(s): LIPASE, AMYLASE in the last 168 hours. No results for input(s): AMMONIA in the last 168 hours. CBC: No results for input(s): WBC, NEUTROABS, HGB, HCT, MCV, PLT in the last 168 hours. Cardiac Enzymes: No results for input(s): CKTOTAL, CKMB, CKMBINDEX, TROPONINI in the last 168 hours. BNP: Invalid input(s): POCBNP CBG: No results for input(s): GLUCAP in the last 168 hours. D-Dimer No results for input(s): DDIMER in the last 72 hours. Hgb A1c No results for input(s): HGBA1C in the last 72 hours. Lipid Profile No results for input(s): CHOL, HDL, LDLCALC, TRIG, CHOLHDL, LDLDIRECT in the last 72 hours. Thyroid function studies No results for input(s): TSH, T4TOTAL, T3FREE, THYROIDAB in the last 72 hours.  Invalid input(s): FREET3 Anemia work up No results for input(s): VITAMINB12, FOLATE, FERRITIN, TIBC, IRON, RETICCTPCT in the last 72 hours. Urinalysis    Component Value Date/Time   COLORURINE YELLOW 06/04/2019 1243   APPEARANCEUR CLEAR 06/04/2019 1243   APPEARANCEUR Clear 08/11/2012 2032   LABSPEC 1.025 06/04/2019 1243   LABSPEC 1.017 08/11/2012 2032   PHURINE 5.5 06/04/2019 1243   GLUCOSEU NEGATIVE 06/04/2019 1243   HGBUR NEGATIVE 06/04/2019 1243   BILIRUBINUR positive 04/15/2020 1059   BILIRUBINUR Negative 08/11/2012 2032   KETONESUR NEGATIVE 06/04/2019 1243   PROTEINUR  Positive (A) 04/15/2020 1059   PROTEINUR NEGATIVE 02/20/2018 1445   UROBILINOGEN 1.0 04/15/2020 1059   UROBILINOGEN 0.2 06/04/2019 1243   NITRITE positive 04/15/2020 1059   NITRITE NEGATIVE 06/04/2019 1243   LEUKOCYTESUR Large (3+) (A) 04/15/2020 1059   LEUKOCYTESUR SMALL (A) 06/04/2019 1243   LEUKOCYTESUR Trace 08/11/2012 2032   Sepsis Labs Invalid input(s): PROCALCITONIN,  WBC,  LACTICIDVEN Microbiology Recent Results (from the past 240 hour(s))  SARS CORONAVIRUS 2 (TAT 6-24 HRS) Nasopharyngeal Nasopharyngeal Swab     Status: None   Collection Time: 09/01/20  5:18 PM   Specimen: Nasopharyngeal Swab  Result Value Ref Range Status   SARS Coronavirus 2 NEGATIVE NEGATIVE Final    Comment: (NOTE) SARS-CoV-2 target nucleic acids are NOT DETECTED.  The SARS-CoV-2 RNA is generally detectable in upper and lower respiratory specimens during the acute phase of infection. Negative results do not preclude SARS-CoV-2 infection, do not rule out co-infections with other pathogens, and should not be used as the sole basis for treatment or other patient management decisions. Negative results must be combined with clinical observations, patient history, and epidemiological information. The expected result is Negative.  Fact Sheet for Patients: SugarRoll.be  Fact Sheet for Healthcare Providers: https://www.woods-mathews.com/  This test is not yet approved or cleared by the [place] FDA and  has been authorized for detection and/or diagnosis of SARS-CoV-2 by FDA under an Emergency Use Authorization (EUA). This EUA will remain  in effect (meaning this test can be used) for the duration of the COVID-19 declaration under Se ction 564(b)(1) of the Act, 21 U.S.C. section 360bbb-3(b)(1), unless the authorization is terminated or revoked sooner.  Performed at [facility], [place] [address] [place], [place] [address]   Body fluid culture w Gram  Stain     Status: None   Collection Time: 09/02/20  1:23 PM   Specimen: Body Fluid  Result Value Ref Range Status   Specimen Description FLUID KNEE RIGHT  Final   Special Requests SYNOVIAL FLUID RIGHT KNEE  Final   Gram Stain   Final    ABUNDANT WBC PRESENT, PREDOMINANTLY PMN NO ORGANISMS SEEN    Culture   Final    NO GROWTH 3 DAYS  Performed at [facility], [place] [address] [place], [place] [address]    Report Status 09/05/2020 FINAL  Final  SARS CORONAVIRUS 2 (TAT 6-24 HRS) Nasopharyngeal Nasopharyngeal Swab     Status: None   Collection Time: 09/08/20 11:43 AM   Specimen: Nasopharyngeal Swab  Result Value Ref Range Status   SARS Coronavirus 2 NEGATIVE NEGATIVE Final    Comment: (NOTE) SARS-CoV-2 target nucleic acids are NOT DETECTED.  The SARS-CoV-2 RNA is generally detectable in upper and lower respiratory specimens during the acute phase of infection. Negative results do not preclude SARS-CoV-2 infection, do not rule out co-infections with other pathogens, and should not be used as the sole basis for treatment or other patient management decisions. Negative results must be combined with clinical observations, patient history, and epidemiological information. The expected result is Negative.  Fact Sheet for Patients: SugarRoll.be  Fact Sheet for Healthcare Providers: https://www.woods-mathews.com/  This test is not yet approved or cleared by the [place] FDA and  has been authorized for detection and/or diagnosis of SARS-CoV-2 by FDA under an Emergency Use Authorization (EUA). This EUA will remain  in effect (meaning this test can be used) for the duration of the COVID-19 declaration under Se ction 564(b)(1) of the Act, 21 U.S.C. section 360bbb-3(b)(1), unless the authorization is terminated or revoked sooner.  Performed at [facility], [place] [address] [place], [place] [address]      Time coordinating  discharge: Over 30 minutes  SIGNED:   Mckinley Jewel, MD  [facility] 09/10/2020, 12:38 PM Pager   If 7PM-7AM, please contact night-coverage [email or web address]

## 2020-09-10 NOTE — TOC Transition Note (Signed)
Transition of Care [facility]) - CM/SW Discharge Note   Patient Details  Name: Dennis Verstraete Winzer Sr. MRN: [id number] Date of Birth: 06-15-1946  [facility]) CM/SW Contact:  Emeterio Reeve, [place] Phone Number: 09/10/2020, 2:24 PM   Clinical Narrative:     Pt will discharge to [facility] via ptar. Pt is covid negative. Pt and [name] have been notified. Ptar transportation has been requested.   Nurse to call report to [phone]. Please ask for [facility], pt will be going to room 1007.   TOC will sign off at this time.   Final next level of care: Skilled Nursing Facility Barriers to Discharge: Barriers Resolved   Patient Goals and CMS Choice Patient states their goals for this hospitalization and ongoing recovery are:: get better, not be in pain CMS Medicare.gov Compare Post Acute Care list provided to:: Patient Choice offered to / list presented to : Patient  Discharge Placement              Patient chooses bed at: [facility] Patient to be transferred to facility by: Ptar Name of family member notified: [name] Patient and family notified of of transfer: 09/10/20  Discharge Plan and Services                                     Social Determinants of Health ([place]) Interventions     Readmission Risk Interventions Readmission Risk Prevention Plan 02/24/2019  Transportation Screening Complete  PCP or Specialist Appt within 5-7 Days Complete  Home Care Screening Complete  Medication Review (RN CM) Complete  Some recent data might be hidden   Emeterio Reeve, Latanya Presser, [place] Social Worker [phone]

## 2020-09-10 NOTE — Progress Notes (Signed)
Patient to be discharged today. AVS, signed DNR and prescription provided to patient in packet per protocol. PIV discontinued. Awaiting PTAR.

## 2020-09-11 ENCOUNTER — Encounter: Payer: Self-pay | Admitting: Adult Health Nurse Practitioner

## 2020-09-11 ENCOUNTER — Telehealth: Payer: Self-pay

## 2020-09-11 NOTE — Telephone Encounter (Signed)
Transition Care Management Follow-up Telephone Call  Date of discharge and from where: 09/10/2020,   How have you been since you were released from the hospital? Patient is currently at [facility] receiving rehab. Has not been discharged home yet.  Any questions or concerns? No  Items Reviewed:  Did the pt receive and understand the discharge instructions provided? Yes   Medications obtained and verified? Yes   Other? No   Any new allergies since your discharge? No   Dietary orders reviewed? Yes  Do you have support at home? Patient is currently at [facility] receiving rehab. Has not been discharged home yet.  Home Care and Equipment/Supplies: Were home health services ordered? not applicable If so, what is the name of the agency?N/A Has the agency set up a time to come to the patient's home? not applicable Were any new equipment or medical supplies ordered?  No What is the name of the medical supply agency? N/A Were you able to get the supplies/equipment? not applicable Do you have any questions related to the use of the equipment or supplies? No  Functional Questionnaire: (I = Independent and D = Dependent) ADLs: I  Bathing/Dressing- I  Meal Prep- I  Eating- I  Maintaining continence- I  Transferring/Ambulation- I  Managing Meds- I  Follow up appointments reviewed:   PCP Hospital f/u appt confirmed? Patient is currently at [facility] receiving rehab. Has not been discharged home yet.   [facility] f/u appt confirmed? Patient is currently at [facility] receiving rehab. Has not been discharged home yet.   Are transportation arrangements needed? No   If their condition worsens, is the pt aware to call PCP or go to the Emergency Dept.? Yes  Was the patient provided with contact information for the PCP's office or ED? Yes  Was to pt encouraged to call back with questions or concerns? Yes

## 2020-09-14 ENCOUNTER — Telehealth: Payer: Self-pay

## 2020-09-14 ENCOUNTER — Encounter: Payer: Self-pay | Admitting: Adult Health Nurse Practitioner

## 2020-09-14 DIAGNOSIS — M25461 Effusion, right knee: Secondary | ICD-10-CM | POA: Diagnosis not present

## 2020-09-14 DIAGNOSIS — M6281 Muscle weakness (generalized): Secondary | ICD-10-CM | POA: Diagnosis not present

## 2020-09-14 DIAGNOSIS — E039 Hypothyroidism, unspecified: Secondary | ICD-10-CM | POA: Diagnosis not present

## 2020-09-14 DIAGNOSIS — I4891 Unspecified atrial fibrillation: Secondary | ICD-10-CM | POA: Diagnosis not present

## 2020-09-14 DIAGNOSIS — M1990 Unspecified osteoarthritis, unspecified site: Secondary | ICD-10-CM | POA: Diagnosis not present

## 2020-09-14 DIAGNOSIS — I5032 Chronic diastolic (congestive) heart failure: Secondary | ICD-10-CM | POA: Diagnosis not present

## 2020-09-14 DIAGNOSIS — G4733 Obstructive sleep apnea (adult) (pediatric): Secondary | ICD-10-CM | POA: Diagnosis not present

## 2020-09-14 DIAGNOSIS — F329 Major depressive disorder, single episode, unspecified: Secondary | ICD-10-CM | POA: Diagnosis not present

## 2020-09-14 DIAGNOSIS — R2681 Unsteadiness on feet: Secondary | ICD-10-CM | POA: Diagnosis not present

## 2020-09-14 DIAGNOSIS — G8929 Other chronic pain: Secondary | ICD-10-CM | POA: Diagnosis not present

## 2020-09-14 DIAGNOSIS — I509 Heart failure, unspecified: Secondary | ICD-10-CM | POA: Diagnosis not present

## 2020-09-14 DIAGNOSIS — I10 Essential (primary) hypertension: Secondary | ICD-10-CM | POA: Diagnosis not present

## 2020-09-14 NOTE — Telephone Encounter (Signed)
I called Dennis [name] back and she was pleasant. She stated she understands this is currently being worked on. She was just worried about his palliative care because they told her they are unable to see him without him having a pcp on file.I am unsure why they are not showing a pcp as [name] did transfer to Dr. Einar Pheasant in February. He is currently in a facility and is not due to be discharged until next week. I advised her that I will be meeting with providers this week to discuss getting him transferred to another provider in our office. I told her I will follow up with her by the end of the week with an update.

## 2020-09-14 NOTE — Telephone Encounter (Signed)
Patient and wife called in to see if office manager, Raquel Sarna, is available to discuss having Dr.Duncan taking over as primary care. Stated they are upset due to having been waiting over a month. Apologized greatly and stated message will be sent back. Please advise and call back at [phone]

## 2020-09-15 ENCOUNTER — Telehealth: Payer: Self-pay | Admitting: Adult Health Nurse Practitioner

## 2020-09-15 NOTE — Telephone Encounter (Signed)
Mandy Careers adviser) and I discussed with Dr. Diona Browner her taking on [name] and his wife as her patients. She has agreed to take them both on. I attempted to reach [facility] to get them scheduled for [facility] appointments. I left a voicemail asking her to call the office and ask for me.

## 2020-09-15 NOTE — Telephone Encounter (Signed)
This is late entry.  Spoke with wife yesterday, 09/14/20.  Wife updated me on her husband's status at facility.  Is interested in continuing palliative services at facility if possible.  Reached out to office to get this referral.  Did inform wife that we will need a new referral once he goes home as he does have new PCP. Tegan Britain K. Olena Heckle NP

## 2020-09-17 ENCOUNTER — Telehealth: Payer: Self-pay | Admitting: Adult Health Nurse Practitioner

## 2020-09-17 ENCOUNTER — Other Ambulatory Visit: Payer: Medicare Other | Admitting: Adult Health Nurse Practitioner

## 2020-09-17 NOTE — Telephone Encounter (Signed)
Spoke with patient and wife today.  He is doing so much better with his pain and progressing well with therapy that he is discharging home.  Have scheduled follow up appointment for 09/29/20 at 2:30.  Fentanyl 50 mcg Q 3 days was added for pain management.  Have concerns as pharmacy needs a week advanced notice to get the medication ordered.  Will order some prior to our visit if needed depending on how many he is prescribed upon discharge from facility. Zilah Villaflor K. Olena Heckle NP

## 2020-09-18 ENCOUNTER — Telehealth: Payer: Self-pay | Admitting: Adult Health Nurse Practitioner

## 2020-09-18 ENCOUNTER — Telehealth: Payer: Self-pay

## 2020-09-18 DIAGNOSIS — E038 Other specified hypothyroidism: Secondary | ICD-10-CM | POA: Diagnosis not present

## 2020-09-18 DIAGNOSIS — E236 Other disorders of pituitary gland: Secondary | ICD-10-CM | POA: Diagnosis not present

## 2020-09-18 DIAGNOSIS — E230 Hypopituitarism: Secondary | ICD-10-CM | POA: Diagnosis not present

## 2020-09-18 DIAGNOSIS — I6789 Other cerebrovascular disease: Secondary | ICD-10-CM | POA: Diagnosis not present

## 2020-09-18 DIAGNOSIS — G20 Parkinson's disease: Secondary | ICD-10-CM | POA: Diagnosis not present

## 2020-09-18 DIAGNOSIS — I480 Paroxysmal atrial fibrillation: Secondary | ICD-10-CM | POA: Diagnosis not present

## 2020-09-18 DIAGNOSIS — G8929 Other chronic pain: Secondary | ICD-10-CM | POA: Diagnosis not present

## 2020-09-18 DIAGNOSIS — E271 Primary adrenocortical insufficiency: Secondary | ICD-10-CM | POA: Diagnosis not present

## 2020-09-18 DIAGNOSIS — M2391 Unspecified internal derangement of right knee: Secondary | ICD-10-CM | POA: Diagnosis not present

## 2020-09-18 DIAGNOSIS — I5032 Chronic diastolic (congestive) heart failure: Secondary | ICD-10-CM | POA: Diagnosis not present

## 2020-09-18 NOTE — Telephone Encounter (Signed)
Spoke to [place] at [facility] and she took the verbal orders to continue palliative care at home for pt, per Dr. Einar Pheasant. Marzetta Board will pass this on to [place].

## 2020-09-18 NOTE — Telephone Encounter (Signed)
Denise from [facility] is requesting verbal orders to continue pallative care at home. Patient has been discharged from skilled care/rehabilitation. Call back number is [phone]

## 2020-09-18 NOTE — Telephone Encounter (Signed)
Returned patient's VM.  He updated on his discharge back home from SNF.  Have visit scheduled for 09/29/20 ro 2:30.  Montavious Wierzba K. Olena Heckle NP

## 2020-09-18 NOTE — Telephone Encounter (Signed)
Ok to continue care at home

## 2020-09-19 DIAGNOSIS — M109 Gout, unspecified: Secondary | ICD-10-CM | POA: Diagnosis not present

## 2020-09-19 DIAGNOSIS — E871 Hypo-osmolality and hyponatremia: Secondary | ICD-10-CM | POA: Diagnosis not present

## 2020-09-19 DIAGNOSIS — E271 Primary adrenocortical insufficiency: Secondary | ICD-10-CM | POA: Diagnosis not present

## 2020-09-19 DIAGNOSIS — I252 Old myocardial infarction: Secondary | ICD-10-CM | POA: Diagnosis not present

## 2020-09-19 DIAGNOSIS — I5032 Chronic diastolic (congestive) heart failure: Secondary | ICD-10-CM | POA: Diagnosis not present

## 2020-09-19 DIAGNOSIS — E039 Hypothyroidism, unspecified: Secondary | ICD-10-CM | POA: Diagnosis not present

## 2020-09-19 DIAGNOSIS — J42 Unspecified chronic bronchitis: Secondary | ICD-10-CM | POA: Diagnosis not present

## 2020-09-19 DIAGNOSIS — G4733 Obstructive sleep apnea (adult) (pediatric): Secondary | ICD-10-CM | POA: Diagnosis not present

## 2020-09-19 DIAGNOSIS — M47896 Other spondylosis, lumbar region: Secondary | ICD-10-CM | POA: Diagnosis not present

## 2020-09-19 DIAGNOSIS — Z87442 Personal history of urinary calculi: Secondary | ICD-10-CM | POA: Diagnosis not present

## 2020-09-19 DIAGNOSIS — Z9682 Presence of neurostimulator: Secondary | ICD-10-CM | POA: Diagnosis not present

## 2020-09-19 DIAGNOSIS — G894 Chronic pain syndrome: Secondary | ICD-10-CM | POA: Diagnosis not present

## 2020-09-19 DIAGNOSIS — I872 Venous insufficiency (chronic) (peripheral): Secondary | ICD-10-CM | POA: Diagnosis not present

## 2020-09-19 DIAGNOSIS — I2510 Atherosclerotic heart disease of native coronary artery without angina pectoris: Secondary | ICD-10-CM | POA: Diagnosis not present

## 2020-09-19 DIAGNOSIS — Z8673 Personal history of transient ischemic attack (TIA), and cerebral infarction without residual deficits: Secondary | ICD-10-CM | POA: Diagnosis not present

## 2020-09-19 DIAGNOSIS — E236 Other disorders of pituitary gland: Secondary | ICD-10-CM | POA: Diagnosis not present

## 2020-09-19 DIAGNOSIS — I480 Paroxysmal atrial fibrillation: Secondary | ICD-10-CM | POA: Diagnosis not present

## 2020-09-19 DIAGNOSIS — Z8546 Personal history of malignant neoplasm of prostate: Secondary | ICD-10-CM | POA: Diagnosis not present

## 2020-09-19 DIAGNOSIS — G20 Parkinson's disease: Secondary | ICD-10-CM | POA: Diagnosis not present

## 2020-09-19 DIAGNOSIS — M1711 Unilateral primary osteoarthritis, right knee: Secondary | ICD-10-CM | POA: Diagnosis not present

## 2020-09-19 DIAGNOSIS — Z6837 Body mass index (BMI) 37.0-37.9, adult: Secondary | ICD-10-CM | POA: Diagnosis not present

## 2020-09-19 DIAGNOSIS — E230 Hypopituitarism: Secondary | ICD-10-CM | POA: Diagnosis not present

## 2020-09-19 DIAGNOSIS — M2391 Unspecified internal derangement of right knee: Secondary | ICD-10-CM | POA: Diagnosis not present

## 2020-09-19 DIAGNOSIS — I110 Hypertensive heart disease with heart failure: Secondary | ICD-10-CM | POA: Diagnosis not present

## 2020-09-21 ENCOUNTER — Telehealth: Payer: Self-pay | Admitting: Family Medicine

## 2020-09-21 NOTE — Telephone Encounter (Signed)
OK for pt as requested

## 2020-09-21 NOTE — Telephone Encounter (Signed)
Left message on Gerald Stabs' secure VM, relaying verbal orders for PT 2 x a week for 3 weeks and 1 x a week for 5 weeks, Dr. Einar Pheasant.

## 2020-09-21 NOTE — Telephone Encounter (Signed)
Gerald Stabs is a physical therapist with [facility] . He is needing verbal orders for the patient for in home Physical therapy: 2 times for 3 week 1 time For 5 weeks

## 2020-09-22 DIAGNOSIS — G20 Parkinson's disease: Secondary | ICD-10-CM | POA: Diagnosis not present

## 2020-09-22 DIAGNOSIS — G894 Chronic pain syndrome: Secondary | ICD-10-CM | POA: Diagnosis not present

## 2020-09-22 DIAGNOSIS — M109 Gout, unspecified: Secondary | ICD-10-CM | POA: Diagnosis not present

## 2020-09-22 DIAGNOSIS — M2391 Unspecified internal derangement of right knee: Secondary | ICD-10-CM | POA: Diagnosis not present

## 2020-09-22 DIAGNOSIS — M47896 Other spondylosis, lumbar region: Secondary | ICD-10-CM | POA: Diagnosis not present

## 2020-09-22 DIAGNOSIS — M1711 Unilateral primary osteoarthritis, right knee: Secondary | ICD-10-CM | POA: Diagnosis not present

## 2020-09-24 ENCOUNTER — Encounter: Payer: Self-pay | Admitting: Family Medicine

## 2020-09-24 ENCOUNTER — Ambulatory Visit (INDEPENDENT_AMBULATORY_CARE_PROVIDER_SITE_OTHER): Payer: Medicare Other | Admitting: Family Medicine

## 2020-09-24 ENCOUNTER — Other Ambulatory Visit: Payer: Self-pay

## 2020-09-24 VITALS — BP 120/86 | HR 107 | Temp 98.0°F | Ht 77.0 in | Wt 326.2 lb

## 2020-09-24 DIAGNOSIS — I952 Hypotension due to drugs: Secondary | ICD-10-CM

## 2020-09-24 DIAGNOSIS — I5032 Chronic diastolic (congestive) heart failure: Secondary | ICD-10-CM | POA: Diagnosis not present

## 2020-09-24 DIAGNOSIS — G4733 Obstructive sleep apnea (adult) (pediatric): Secondary | ICD-10-CM

## 2020-09-24 DIAGNOSIS — M1711 Unilateral primary osteoarthritis, right knee: Secondary | ICD-10-CM | POA: Diagnosis not present

## 2020-09-24 DIAGNOSIS — M5442 Lumbago with sciatica, left side: Secondary | ICD-10-CM

## 2020-09-24 DIAGNOSIS — Z8673 Personal history of transient ischemic attack (TIA), and cerebral infarction without residual deficits: Secondary | ICD-10-CM | POA: Diagnosis not present

## 2020-09-24 DIAGNOSIS — I10 Essential (primary) hypertension: Secondary | ICD-10-CM

## 2020-09-24 DIAGNOSIS — G8929 Other chronic pain: Secondary | ICD-10-CM

## 2020-09-24 DIAGNOSIS — G894 Chronic pain syndrome: Secondary | ICD-10-CM | POA: Diagnosis not present

## 2020-09-24 DIAGNOSIS — M1A9XX1 Chronic gout, unspecified, with tophus (tophi): Secondary | ICD-10-CM | POA: Diagnosis not present

## 2020-09-24 DIAGNOSIS — I2510 Atherosclerotic heart disease of native coronary artery without angina pectoris: Secondary | ICD-10-CM | POA: Diagnosis not present

## 2020-09-24 DIAGNOSIS — E7800 Pure hypercholesterolemia, unspecified: Secondary | ICD-10-CM | POA: Diagnosis not present

## 2020-09-24 DIAGNOSIS — M47896 Other spondylosis, lumbar region: Secondary | ICD-10-CM | POA: Diagnosis not present

## 2020-09-24 DIAGNOSIS — G20 Parkinson's disease: Secondary | ICD-10-CM

## 2020-09-24 DIAGNOSIS — I480 Paroxysmal atrial fibrillation: Secondary | ICD-10-CM

## 2020-09-24 DIAGNOSIS — M159 Polyosteoarthritis, unspecified: Secondary | ICD-10-CM | POA: Diagnosis not present

## 2020-09-24 DIAGNOSIS — E230 Hypopituitarism: Secondary | ICD-10-CM

## 2020-09-24 DIAGNOSIS — E271 Primary adrenocortical insufficiency: Secondary | ICD-10-CM | POA: Diagnosis not present

## 2020-09-24 DIAGNOSIS — M109 Gout, unspecified: Secondary | ICD-10-CM | POA: Diagnosis not present

## 2020-09-24 DIAGNOSIS — F320 Major depressive disorder, single episode, mild: Secondary | ICD-10-CM

## 2020-09-24 DIAGNOSIS — M2391 Unspecified internal derangement of right knee: Secondary | ICD-10-CM | POA: Diagnosis not present

## 2020-09-24 DIAGNOSIS — Z87440 Personal history of urinary (tract) infections: Secondary | ICD-10-CM

## 2020-09-24 DIAGNOSIS — M5441 Lumbago with sciatica, right side: Secondary | ICD-10-CM

## 2020-09-24 DIAGNOSIS — Z8546 Personal history of malignant neoplasm of prostate: Secondary | ICD-10-CM

## 2020-09-24 NOTE — Assessment & Plan Note (Signed)
Now on no med due to improved with SE of Sinemet

## 2020-09-24 NOTE — Assessment & Plan Note (Signed)
No need for rate control. On Eliquis anticoagulation.

## 2020-09-24 NOTE — Assessment & Plan Note (Addendum)
He has very good improvement in pain  In last 2 weeks. Fentanyl has helped dramatically.  He was able to walk into the office today... has not walked in last year.

## 2020-09-24 NOTE — Assessment & Plan Note (Signed)
On testosterone replacement, followed by ENDO Dr. Dwyane Dee.

## 2020-09-24 NOTE — Progress Notes (Signed)
Patient ID: Dennis Fountain Sr., male    DOB: 09/24/1945, 75 y.o.   MRN: [id number]  This visit was conducted in person.  BP 120/86   Pulse (!) 107   Temp 98 F (36.7 C) (Temporal)   Ht 6\' 5"  (1.956 m)   Wt (!) 326 lb 4 oz (148 kg)   SpO2 96%   BMI 38.69 kg/m    Chief Complaint  Patient presents with  . Establish Care    TOC from D. Gessner    Subjective:   HPI: Dennis Alban Rogness Sr. is a 75 y.o. male presenting on 09/24/2020 for Establish Care (TOC from D. 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  He has been struggling for the past few years, had not been able to walk His previous PCP  Referred him to palliative care in early 2021 for  Parkinsonism due to Agent Orange exposure.  Palliative Care Contact is Hanley Ben, NP. They have been addressing his severe pain from knees and chronic low back: He is doing dramatically  better now on 50 mcg fentanyl patches every 3 days. He is on Xtampza a long acting oxycodone.  Has oxycodone to use as needed prn but has rarely had to use it. No constipation, no dizziness.  Currently in PT and OT.   Chemical Parkinson'r Dx in 2014.. due to [employer]. Followed at [facility] by Dr. Anna Genre.  He is on  Sinemet and  tompiramate.  Hypothyroid:: Stable control  On levothyroxine 137 mcg daily Lab Results  Component Value Date   TSH 1.778 09/02/2020     CHF/Paroxsysmal Afib, CAD, s/pNSTEMI with minor disease(2001)  Followed By Glennie Hawk Rate controlled, On lasix for swelling.. this controls it well.  On Eliquis anticoagulation Hx of multiple TIA/CVA.. first in 2007 TEE completed at that time with no evidence of intracardiac thrombus. Cardiac MRA with minimal carotid plaque. A loop recorder was placed ( removed in 2020) showing afib.  Sleep apnea: on CPAP.  Gout:  Negative for cystals on right knee tap.  On  allopurino and colchicine  Hypercholesterolemia:  Due for re-eval  On crestor 20 mg daily  HTN: Good control on no medication BP Readings from Last 3 Encounters:  09/24/20 120/86  09/10/20 102/79  08/29/20 (!) 145/109   MDD, well controlled on cymbalta. Chronic hypogonadotrophic hypogonadism/addison's disease, hypopituitary: On cortef 10 mg daily On testosterone replacement.   Dr. Dwyane Dee follbowing.  He was in hospital 09/01/2020 for right leg pain.   secondary to osteoarthritis -S/P Rtknee tap on 2/19 no crystals culture negative and repeat aspiration and also cortisone injection 2/23- knee fluid culture no growth. -Seen by palliative for pain management. At this time pain is well controlled cont'd on fentanyl patch50 MCG, Cymbalta,lidocaine patch,opiatesprn,muscle relaxant. -PT recommended SNF -His symptoms has improved.  Orthopedic recommended weightbearing as tolerated, up with PT as needed.    HX of prostate cancer,testicular cancer. Recurrent UTI.Marland Kitchen occurring monthly. Last OV 04/2020  likely secondary to Agent Orange.  He has also had a history of recurrent UTI... ECOli on culture in 04/2020, 07/2020.. treated with keflex and macrobid respectively.. multi drug resistant  Urine culture clear on 08/04/2020  Has urologist but has not seen in a while.  Patient Care Team: Jinny Sanders, MD as PCP - General (Family Medicine) Eustace Moore, MD as Consulting Physician (Neurosurgery) Cameron Sprang.  F., MD as Consulting Physician (Urology) Elayne Snare, MD as Consulting Physician (Endocrinology) Michaelle Birks Gwenyth Allegra., MD as Physician Assistant (Sports Medicine) Burnell Blanks, MD as Consulting Physician (Cardiology) Barbaraann Cao, OD as Referring Physician (Optometry) Lovena Neighbours, Conception Oms, MD as Consulting Physician (Urology) Berneta Sages, NP as Nurse Practitioner (Adult Health Nurse Practitioner) Amanda Pea, MD as Referring Physician  (Neurology)  . .[facility] past medical, surgical, family and social history reviewed and updated as indicated. Interim medical history since our last visit reviewed. Allergies and medications reviewed and updated. Outpatient Medications Prior to Visit  Medication Sig Dispense Refill  . allopurinol (ZYLOPRIM) 300 MG tablet Take 450 mg by mouth daily as needed.    Marland Kitchen apixaban (ELIQUIS) 5 MG TABS tablet Take 1 tablet (5 mg total) by mouth 2 (two) times daily. 180 tablet 3  . carbidopa-levodopa (SINEMET IR) 25-250 MG tablet TAKE 2 TABLETS BY MOUTH 4  TIMES DAILY 720 tablet 1  . colchicine 0.6 MG tablet Take 0.6 mg by mouth daily as needed.    . cyclobenzaprine (FLEXERIL) 5 MG tablet Take 5 mg by mouth 3 (three) times daily as needed for muscle spasms.    . DULoxetine (CYMBALTA) 60 MG capsule Take 1 capsule (60 mg total) by mouth daily. 90 capsule 3  . EPINEPHrine 0.3 mg/0.3 mL IJ SOAJ injection Inject 0.3 mg into the muscle as needed (allergic reaction). 2 each 1  . fentaNYL (DURAGESIC) 50 MCG/HR Place 1 patch onto the skin every 3 (three) days. 5 patch 0  . furosemide (LASIX) 40 MG tablet TAKE 1 TABLET(40 MG) BY MOUTH DAILY 90 tablet 0  . hydrocortisone (CORTEF) 10 MG tablet TAKE 2 TABLETS BY MOUTH EVERY MORNING AND 1 TABLET AT 5 PM EVERY DAY (Patient taking differently: Take 10-20 mg by mouth See admin instructions. Takes 2 tablets in the morning and 1 tablet in the evening.) 270 tablet 1  . levothyroxine (SYNTHROID) 137 MCG tablet TAKE 2 TABLETS BY MOUTH  DAILY BEFORE BREAKFAST AND TAKE 1 EXTRA TABLET WEEKLY    . lidocaine (LIDODERM) 5 % Place 1 patch onto the skin daily. Remove & Discard patch within 12 hours or as directed by MD    . Multiple Vitamin (MULTIVITAMIN WITH MINERALS) TABS tablet Take 1 tablet by mouth daily. 30 tablet 0  . NEEDLE, DISP, 22 G 22G X 1" MISC Use every 14 days 10 each 3  . oxyCODONE-acetaminophen (PERCOCET) 10-325 MG tablet Take 1 tablet by mouth every 4 (four) hours as  needed for pain.    . OXYCONTIN 20 MG 12 hr tablet Take 20 mg by mouth every 12 (twelve) hours.    . polyethylene glycol (MIRALAX / GLYCOLAX) 17 g packet Take 17 g by mouth daily. 14 each 0  . potassium chloride (K-DUR) 10 MEQ tablet Take 1 tablet (10 mEq total) by mouth daily. 90 tablet 3  . predniSONE (DELTASONE) 10 MG tablet Take 2 tablets (20 mg total) by mouth daily. 10 tablet 0  . rosuvastatin (CRESTOR) 20 MG tablet Take 1 tablet (20 mg total) by mouth daily. 90 tablet 3  . senna (SENOKOT) 8.6 MG TABS tablet Take 1 tablet (8.6 mg total) by mouth 2 (two) times daily. 60 tablet 0  . Syringe/Needle, Disp, (SYRINGE 3CC/21GX1") 21G X 1" 3 ML MISC Use every 14 days 6 each 3  . Testosterone Cypionate 200 MG/ML SOLN Inject 160 mg into the muscle every 14 (fourteen) days.    Marland Kitchen topiramate (TOPAMAX) 50  MG tablet TAKE 1 TABLET BY MOUTH  TWICE DAILY 180 tablet 3  . XTAMPZA ER 36 MG C12A Take 1 capsule by mouth every 12 (twelve) hours.    . colchicine 0.6 MG tablet TAKE 1 TABLET BY MOUTH TWICE DAILY (Patient taking differently: Take 0.6 mg by mouth daily as needed (gout).) 60 tablet 2  . levothyroxine (SYNTHROID) 137 MCG tablet TAKE 2 TABLETS BY MOUTH  DAILY BEFORE BREAKFAST AND TAKE 1 EXTRA TABLET WEEKLY (Patient taking differently: TAKE 2 TABLETS BY MOUTH  DAILY BEFORE BREAKFAST AND TAKE 1 EXTRA TABLET WEEKLY) 192 tablet 1  . lidocaine (LIDODERM) 5 % APPLY 1 PATCH TO SKIN EVERY DAY. REMOVE AND DISCARD AFTER 12 HOURS OR AS DIRECTED BY MD (Patient taking differently: Place 1 patch onto the skin daily.) 30 patch 2  . testosterone cypionate (DEPO-TESTOSTERONE) 200 MG/ML injection Inject 0.6 mLs (120 mg total) into the muscle every 14 (fourteen) days. (Patient taking differently: Inject 165 mg into the muscle every 14 (fourteen) days.) 10 mL 0   No facility-administered medications prior to visit.     Per HPI unless specifically indicated in ROS section below Review of Systems  Constitutional: Negative for  fatigue and fever.  HENT: Negative for ear pain.   Eyes: Negative for pain.  Respiratory: Negative for cough and shortness of breath.   Cardiovascular: Negative for chest pain, palpitations and leg swelling.  Gastrointestinal: Negative for abdominal pain.  Genitourinary: Negative for dysuria.  Musculoskeletal: Negative for arthralgias.  Neurological: Negative for syncope, light-headedness and headaches.  Psychiatric/Behavioral: Negative for dysphoric mood.   Objective:  BP 120/86   Pulse (!) 107   Temp 98 F (36.7 C) (Temporal)   Ht 6\' 5"  (1.956 m)   Wt (!) 326 lb 4 oz (148 kg)   SpO2 96%   BMI 38.69 kg/m   Wt Readings from Last 3 Encounters:  09/24/20 (!) 326 lb 4 oz (148 kg)  09/02/20 (!) 352 lb 11.8 oz (160 kg)  08/19/20 (!) 350 lb 8 oz (159 kg)      Physical Exam Constitutional:      Appearance: He is well-developed. He is obese.  HENT:     Head: Normocephalic.     Right Ear: Hearing normal.     Left Ear: Hearing normal.     Nose: Nose normal.     Mouth/Throat:     Mouth: Mucous membranes are moist.  Eyes:     Pupils: Pupils are equal, round, and reactive to light.  Neck:     Thyroid: No thyroid mass or thyromegaly.     Vascular: No carotid bruit.     Trachea: Trachea normal.  Cardiovascular:     Rate and Rhythm: Normal rate and regular rhythm.     Pulses: Normal pulses.     Heart sounds: Heart sounds not distant. No murmur heard. No friction rub. No gallop.      Comments: No peripheral edema Pulmonary:     Effort: Pulmonary effort is normal. No respiratory distress.     Breath sounds: Normal breath sounds.  Musculoskeletal:     Right lower leg: No edema.     Left lower leg: No edema.     Comments: Bilateral chronic venous changes.  Skin:    General: Skin is warm and dry.     Findings: No rash.  Neurological:     Mental Status: He is alert and oriented to person, place, and time.     Sensory: No sensory deficit.  Gait: Gait abnormal.  Psychiatric:         Speech: Speech normal.        Behavior: Behavior normal.        Thought Content: Thought content normal.       Results for orders placed or performed during the hospital encounter of 09/01/20  SARS CORONAVIRUS 2 (TAT 6-24 HRS) Nasopharyngeal Nasopharyngeal Swab   Specimen: Nasopharyngeal Swab  Result Value Ref Range   SARS Coronavirus 2 NEGATIVE NEGATIVE  Body fluid culture w Gram Stain   Specimen: Body Fluid  Result Value Ref Range   Specimen Description FLUID KNEE RIGHT    Special Requests SYNOVIAL FLUID RIGHT KNEE    Gram Stain      ABUNDANT WBC PRESENT, PREDOMINANTLY PMN NO ORGANISMS SEEN    Culture      NO GROWTH 3 DAYS Performed at [facility], [address], [place], [place] [address]    Report Status 09/05/2020 FINAL   SARS CORONAVIRUS 2 (TAT 6-24 HRS) Nasopharyngeal Nasopharyngeal Swab   Specimen: Nasopharyngeal Swab  Result Value Ref Range   SARS Coronavirus 2 NEGATIVE NEGATIVE  Basic metabolic panel  Result Value Ref Range   Sodium 132 (L) 135 - 145 mmol/L   Potassium 4.2 3.5 - 5.1 mmol/L   Chloride 96 (L) 98 - 111 mmol/L   CO2 25 22 - 32 mmol/L   Glucose, Bld 142 (H) 70 - 99 mg/dL   BUN 23 8 - 23 mg/dL   Creatinine, Ser 1.17 0.61 - 1.24 mg/dL   Calcium 9.5 8.9 - 10.3 mg/dL   GFR, Estimated >60 >60 mL/min   Anion gap 11 5 - 15  CBC  Result Value Ref Range   WBC 9.8 4.0 - 10.5 K/uL   RBC 5.65 4.22 - 5.81 MIL/uL   Hemoglobin 15.6 13.0 - 17.0 g/dL   HCT 48.0 39.0 - 52.0 %   MCV 85.0 80.0 - 100.0 fL   MCH 27.6 26.0 - 34.0 pg   MCHC 32.5 30.0 - 36.0 g/dL   RDW 13.7 11.5 - 15.5 %   Platelets 203 150 - 400 K/uL   nRBC 0.0 0.0 - 0.2 %  D-dimer, quantitative  Result Value Ref Range   D-Dimer, Quant 1.14 (H) 0.00 - 0.50 ug/mL-FEU  Lactic acid, plasma  Result Value Ref Range   Lactic Acid, Venous 0.9 0.5 - 1.9 mmol/L  Lactic acid, plasma  Result Value Ref Range   Lactic Acid, Venous 0.9 0.5 - 1.9 mmol/L  CK  Result Value Ref Range    Total CK 22 (L) 49 - 397 U/L  Creatinine, urine, random  Result Value Ref Range   Creatinine, Urine 166.61 mg/dL  Sodium, urine, random  Result Value Ref Range   Sodium, Ur 50 mmol/L  Osmolality, urine  Result Value Ref Range   Osmolality, Ur 823 300 - 900 mOsm/kg  Osmolality  Result Value Ref Range   Osmolality 290 275 - 295 mOsm/kg  Magnesium  Result Value Ref Range   Magnesium 2.1 1.7 - 2.4 mg/dL  Phosphorus  Result Value Ref Range   Phosphorus 3.0 2.5 - 4.6 mg/dL  CBC WITH DIFFERENTIAL  Result Value Ref Range   WBC 9.8 4.0 - 10.5 K/uL   RBC 5.27 4.22 - 5.81 MIL/uL   Hemoglobin 14.1 13.0 - 17.0 g/dL   HCT 44.4 39.0 - 52.0 %   MCV 84.3 80.0 - 100.0 fL   MCH 26.8 26.0 - 34.0 pg  MCHC 31.8 30.0 - 36.0 g/dL   RDW 13.6 11.5 - 15.5 %   Platelets 186 150 - 400 K/uL   nRBC 0.0 0.0 - 0.2 %   Neutrophils Relative % 88 %   Neutro Abs 8.6 (H) 1.7 - 7.7 K/uL   Lymphocytes Relative 7 %   Lymphs Abs 0.7 0.7 - 4.0 K/uL   Monocytes Relative 5 %   Monocytes Absolute 0.4 0.1 - 1.0 K/uL   Eosinophils Relative 0 %   Eosinophils Absolute 0.0 0.0 - 0.5 K/uL   Basophils Relative 0 %   Basophils Absolute 0.0 0.0 - 0.1 K/uL   Immature Granulocytes 0 %   Abs Immature Granulocytes 0.03 0.00 - 0.07 K/uL  TSH  Result Value Ref Range   TSH 1.778 0.350 - 4.500 uIU/mL  Comprehensive metabolic panel  Result Value Ref Range   Sodium 131 (L) 135 - 145 mmol/L   Potassium 4.1 3.5 - 5.1 mmol/L   Chloride 96 (L) 98 - 111 mmol/L   CO2 27 22 - 32 mmol/L   Glucose, Bld 115 (H) 70 - 99 mg/dL   BUN 21 8 - 23 mg/dL   Creatinine, Ser 0.98 0.61 - 1.24 mg/dL   Calcium 9.3 8.9 - 10.3 mg/dL   Total Protein 6.8 6.5 - 8.1 g/dL   Albumin 3.4 (L) 3.5 - 5.0 g/dL   AST 13 (L) 15 - 41 U/L   ALT 5 0 - 44 U/L   Alkaline Phosphatase 47 38 - 126 U/L   Total Bilirubin 1.2 0.3 - 1.2 mg/dL   GFR, Estimated >60 >60 mL/min   Anion gap 8 5 - 15  Synovial cell count + diff, w/ crystals  Result Value Ref Range    Color, Synovial ORANGE (A) YELLOW   Appearance-Synovial CLOUDY (A) CLEAR   Crystals, Fluid NO CRYSTALS SEEN    WBC, Synovial 4,150 (H) 0 - 200 /cu mm   Neutrophil, Synovial 98 (H) 0 - 25 %   Lymphocytes-Synovial Fld 1 0 - 20 %   Monocyte-Macrophage-Synovial Fluid 1 (L) 50 - 90 %   Eosinophils-Synovial 0 0 - 1 %  Troponin I (High Sensitivity)  Result Value Ref Range   Troponin I (High Sensitivity) 10 <18 ng/L  Troponin I (High Sensitivity)  Result Value Ref Range   Troponin I (High Sensitivity) 5 <18 ng/L    This visit occurred during the SARS-CoV-2 public health emergency.  Safety protocols were in place, including screening questions prior to the visit, additional usage of staff PPE, and extensive cleaning of exam room while observing appropriate contact time as indicated for disinfecting solutions.   COVID 19 screen:  No recent travel or known exposure to COVID19 The patient denies respiratory symptoms of COVID 19 at this time. The importance of social distancing was discussed today.   Assessment and Plan Problem List Items Addressed This Visit    Addison disease ([place])    Secondary to agent orange exposure.  Stable control on Cortef.  Followed by Dr. Dwyane Dee ENDO      CAD (coronary artery disease)     On statin, medical management  No anginal symptoms currently.      Chronic back pain     He has very good improvement in pain  In last 2 weeks. Fentanyl has helped dramatically.  He was able to walk into the office today... has not walked in last year.      Relevant Medications   hydrocortisone (CORTEF) 10 MG tablet  Chronic diastolic CHF (congestive heart failure) (HCC) - Primary    Euvolemic in office today on lasix 20 mg daily      Chronic tophaceous gout     Usually well controlled on allopurinol.. did have flare in right ankle after hospitalization.  Has had multiple removals of tophi from fingers.      Relevant Medications   colchicine 0.6 MG tablet    hydrocortisone (CORTEF) 10 MG tablet   Essential hypertension    Now on no med due to improved with SE of Sinemet      History of prostate cancer    Followed by urology.. Dr. Lovena Neighbours      History of recurrent UTIs   History of stroke    Hx of multiple TIA/CVA.. first in 2007 TEE completed at that time with no evidence of intracardiac thrombus. Cardiac MRA with minimal carotid plaque. A loop recorder was placed ( removed in 2020) showing afib as likely etiology.  Now on Eliquis.       Hypercholesterolemia    Due for re-eval on crestor.. will check prior to upcoming AM/CPX      Hypogonadotropic hypogonadism ([place])    On testosterone replacement, followed by ENDO Dr. Dwyane Dee.      Hypopituitarism due to empty sella syndrome ([place])    Followed by Dr. Dwyane Dee.      Hypotension due to drugs    BP issues resolved after low BPs from Sinemet.      Major depressive disorder, single episode, mild (HCC)    Improved control with improved pain.  On cymbalta 60 mg daily.      Morbid obesity ([place])    Encouraged exercise, weight loss, healthy eating habits.       Osteoarthritis, multiple sites    Osteoarthritis in hips , knee and ankles      Relevant Medications   colchicine 0.6 MG tablet   hydrocortisone (CORTEF) 10 MG tablet   Parkinson disease ([place]) for Agent Orange    Followed by Dr., Crissie Figures Neurology through [facility]. On Sinemet.      Paroxysmal atrial fibrillation (HCC)     No need for rate control. On Eliquis anticoagulation.      Sleep apnea    Stable on CPAP            Eliezer Lofts, MD

## 2020-09-24 NOTE — Assessment & Plan Note (Signed)
Stable on CPAP 

## 2020-09-24 NOTE — Assessment & Plan Note (Signed)
Improved control with improved pain.  On cymbalta 60 mg daily.

## 2020-09-24 NOTE — Assessment & Plan Note (Signed)
Secondary to agent orange exposure.  Stable control on Cortef.  Followed by Dr. Dwyane Dee ENDO

## 2020-09-24 NOTE — Assessment & Plan Note (Addendum)
On statin, medical management  No anginal symptoms currently.

## 2020-09-24 NOTE — Assessment & Plan Note (Signed)
Followed by urology.. Dr. Lovena Neighbours

## 2020-09-24 NOTE — Assessment & Plan Note (Signed)
Osteoarthritis in hips , knee and ankles

## 2020-09-24 NOTE — Assessment & Plan Note (Signed)
Euvolemic in office today on lasix 20 mg daily

## 2020-09-24 NOTE — Assessment & Plan Note (Signed)
Followed by Dr., Crissie Figures Neurology through [facility]. On Sinemet.

## 2020-09-24 NOTE — Assessment & Plan Note (Signed)
BP issues resolved after low BPs from Sinemet.

## 2020-09-24 NOTE — Assessment & Plan Note (Signed)
Due for re-eval on crestor.. will check prior to upcoming AM/CPX

## 2020-09-24 NOTE — Assessment & Plan Note (Addendum)
Usually well controlled on allopurinol.. did have flare in right ankle after hospitalization.  Has had multiple removals of tophi from fingers.

## 2020-09-24 NOTE — Assessment & Plan Note (Signed)
Hx of multiple TIA/CVA.. first in 2007 TEE completed at that time with no evidence of intracardiac thrombus. Cardiac MRA with minimal carotid plaque. A loop recorder was placed ( removed in 2020) showing afib as likely etiology.  Now on Eliquis.

## 2020-09-24 NOTE — Assessment & Plan Note (Signed)
Followed by Dr. Dwyane Dee.

## 2020-09-24 NOTE — Assessment & Plan Note (Signed)
Encouraged exercise, weight loss, healthy eating habits. ? ?

## 2020-09-25 DIAGNOSIS — M2391 Unspecified internal derangement of right knee: Secondary | ICD-10-CM | POA: Diagnosis not present

## 2020-09-25 DIAGNOSIS — M1711 Unilateral primary osteoarthritis, right knee: Secondary | ICD-10-CM | POA: Diagnosis not present

## 2020-09-25 DIAGNOSIS — G894 Chronic pain syndrome: Secondary | ICD-10-CM | POA: Diagnosis not present

## 2020-09-25 DIAGNOSIS — G20 Parkinson's disease: Secondary | ICD-10-CM | POA: Diagnosis not present

## 2020-09-25 DIAGNOSIS — M47896 Other spondylosis, lumbar region: Secondary | ICD-10-CM | POA: Diagnosis not present

## 2020-09-25 DIAGNOSIS — M109 Gout, unspecified: Secondary | ICD-10-CM | POA: Diagnosis not present

## 2020-09-28 DIAGNOSIS — M47896 Other spondylosis, lumbar region: Secondary | ICD-10-CM | POA: Diagnosis not present

## 2020-09-28 DIAGNOSIS — M1711 Unilateral primary osteoarthritis, right knee: Secondary | ICD-10-CM | POA: Diagnosis not present

## 2020-09-28 DIAGNOSIS — M109 Gout, unspecified: Secondary | ICD-10-CM | POA: Diagnosis not present

## 2020-09-28 DIAGNOSIS — M2391 Unspecified internal derangement of right knee: Secondary | ICD-10-CM | POA: Diagnosis not present

## 2020-09-28 DIAGNOSIS — G20 Parkinson's disease: Secondary | ICD-10-CM | POA: Diagnosis not present

## 2020-09-28 DIAGNOSIS — G894 Chronic pain syndrome: Secondary | ICD-10-CM | POA: Diagnosis not present

## 2020-09-29 ENCOUNTER — Other Ambulatory Visit: Payer: Medicare Other | Admitting: Adult Health Nurse Practitioner

## 2020-09-29 ENCOUNTER — Other Ambulatory Visit: Payer: Self-pay

## 2020-09-29 DIAGNOSIS — G20 Parkinson's disease: Secondary | ICD-10-CM | POA: Diagnosis not present

## 2020-09-29 DIAGNOSIS — M5442 Lumbago with sciatica, left side: Secondary | ICD-10-CM | POA: Diagnosis not present

## 2020-09-29 DIAGNOSIS — Z515 Encounter for palliative care: Secondary | ICD-10-CM

## 2020-09-29 DIAGNOSIS — M159 Polyosteoarthritis, unspecified: Secondary | ICD-10-CM | POA: Diagnosis not present

## 2020-09-29 DIAGNOSIS — G8929 Other chronic pain: Secondary | ICD-10-CM

## 2020-09-29 DIAGNOSIS — M5441 Lumbago with sciatica, right side: Secondary | ICD-10-CM | POA: Diagnosis not present

## 2020-09-29 DIAGNOSIS — R634 Abnormal weight loss: Secondary | ICD-10-CM

## 2020-09-29 NOTE — Progress Notes (Signed)
[facility] Consult Note Telephone: [phone]  Fax: [phone]  PATIENT NAME: Dennis Wadhwa Sr. DOB: [birth year] MRN: [id number]  PRIMARY CARE PROVIDER:   Jinny Sanders, MD  REFERRING PROVIDER:  [name], MD [place],  [place] [address]  RESPONSIBLE PARTY:   Primo Innis, wife [phone]  Chief complaint:Follow up palliative visit for complex medical decision making  RECOMMENDATIONS and PLAN: 1.Advanced care planning. Patient is DNR  2.  Chronic pain.  This is related to chronic low back pain and osteoarthritis of multiple joints.  Pain is well managed at this time with fentanyl 50 mcg every 3 days and OxyContin 20 mg twice daily.  He also has Percocet 7.5/325 mg every 8 hours as needed for breakthrough pain.  He has not been needing to use this.  Discussed reducing the OxyContin discussed reducing the OxyContin in hopes of possibly discontinuing this in the future.  3.  Parkinson's.  Patient has no new complaints of increased tremors or freezing episodes.  Stable at this time.  Continue follow-up and recommendations by neurology  4.  Weight loss.  Patient is continuing to have weight loss and decreased appetite.  Though it seems as if he is eating a little bit more with his meals.  We will continue to monitor this as he becomes more active he may lose more weight.  Also with increased activity his appetite may increase again.  Palliative will continue to monitor for symptom management/decline and make recommendations as needed.  Follow-up visit in 4 weeks.  Encouraged to call with any questions or concerns  HISTORY OF PRESENT ILLNESS:  Dennis Aumiller Teem Sr. is a 75 y.o. year old male with multiple medical problems including Parkinson's disease, CHF, Addison's disease, CVA, MI, sleep apnea, hypertension, hyperlipidemia, hypothyroidism, gout, history of testicular cancer and prostate cancer. Palliative Care  was asked to help address goals of care. Reviewed EMR including most recent labs and imaging.  Patient had hospital stay 2/22-09/10/20 for right knee pain secondary to his arthritis.  Patient had to have fluid drained off of his knee.  Cultures were negative for infection or crystals.  Patient's pain was managed by palliative services in the hospital.  This has been a huge help in managing his pain in an inpatient setting.  Patient was started on fentanyl 50 mcg/hr patch every 3 days.  Patient spent about 10 days in skilled nursing facility for short-term rehab.  Patient is doing much better and is able to walk and not have to use his wheelchair.  States that his pain is a 0.  He is on the fentanyl 50 mcg patch every 3 days and is using OxyContin 20 mg every 12 hours.  He is working with PT through advanced home health.  He was evaluated by home health OT and did not need this at this time.  Patient states that he has been having regular soft bowel movements.  He has not been taking any stool softener or laxatives daily for this.  States that he has been sleeping well throughout the night.  States that his appetite is still not the best.  Has been trying to eat more.  Has continued to lose more weight.  Was 350 pounds in February and is now 320 pounds.  Rest of 10 point ROS asked and negative  CODE STATUS: DNR  PPS: 40% HOSPICE ELIGIBILITY/DIAGNOSIS: TBD  PHYSICAL EXAM:  BP 142/82  HR 85  O2 99% on RA weight 320 pounds General: NAD, frail appearing Eyes: Sclera anicteric and noninjected with no discharge noted ENMT: moist mucous membranes Cardiovascular: regular rate and rhythm Pulmonary:lung sounds clear; normal respiratory effort Abdomen: soft, nontender,hypoactivebowel sounds Extremities: trace edema to bilateral feet, no joint deformities Skin: no rasheson exposed skin Neurological: Weakness otherwise nonfocal  PAST MEDICAL HISTORY:  Past Medical History:  Diagnosis Date  . Addison's  disease ([facility]) 01/2016  . Arthritis    low back - DDD  . Cataract 2019   corrected with surgery  . Cellulitis, scrotum 08/02/2014  . Chronic diastolic CHF (congestive heart failure) ([facility])    Echo 12/18: severe LVH, EF 60-65, Gr 1 DD // Echo 5/18: EF 65-70, Gr 1 DD  . Chronic lower back pain    "from [facility] 2007"  . Complication of anesthesia    Sometimes has N&V /w anesth.   . Coronary artery disease    NSTEMI >> LHC 9/01: prox and mid LAD 50-70; mRCA 40 >> med Rx // Nuc 8/13 [facility]):  no infarct or ischemia, EF 59  . Elevated PSA   . Epididymitis, left 08/04/2014  . History of chronic bronchitis   . History of gout   . History of stroke 01/22/2016   2004 - "right brain stem; no residual " // 2017  . Hypertension   . Hypocholesteremia   . Hypothyroidism   . Ileitis 11/18/2018  . Infection of skin due to methicillin resistant Staphylococcus aureus (MRSA) 12/28/2017  . Kidney stone   . Myocardial infarction [facility]) 2001   2001- cardiac cath., cardiac clearanece note dr Otho Perl 05-14-13 on chart, stress test results 02-21-12 on chart  . OSA on CPAP    cpap setting of 10  . Parkinson's disease ([facility])    stage 4   . Pneumonia 2000's and 2013  . PONV (postoperative nausea and vomiting)   . [facility] spotted fever   . Stroke (cerebrum) ([facility])   . Testicular cancer ([facility]) 2015    SOCIAL HX:  Social History   Tobacco Use  . Smoking status: Never Smoker  . Smokeless tobacco: Never Used  Substance Use Topics  . Alcohol use: Not Currently    ALLERGIES:  Allergies  Allergen Reactions  . Bee Venom Anaphylaxis  . Shrimp [Shellfish Allergy] Anaphylaxis and Other (See Comments)    "just the protein in the shrimp"  . Stadol [Butorphanol] Anaphylaxis, Shortness Of Breath and Other (See Comments)    Respiratory distress, couldn't breathe, cardiac arrest  . Wasp Venom Anaphylaxis  . Other Other (See Comments)    Protein supplement - unknown     PERTINENT MEDICATIONS:  Outpatient Encounter  Medications as of 09/29/2020  Medication Sig  . allopurinol (ZYLOPRIM) 300 MG tablet Take 450 mg by mouth daily as needed.  Marland Kitchen apixaban (ELIQUIS) 5 MG TABS tablet Take 1 tablet (5 mg total) by mouth 2 (two) times daily.  . carbidopa-levodopa (SINEMET IR) 25-250 MG tablet TAKE 2 TABLETS BY MOUTH 4  TIMES DAILY  . colchicine 0.6 MG tablet Take 0.6 mg by mouth daily as needed.  . cyclobenzaprine (FLEXERIL) 5 MG tablet Take 5 mg by mouth 3 (three) times daily as needed for muscle spasms.  . DULoxetine (CYMBALTA) 60 MG capsule Take 1 capsule (60 mg total) by mouth daily.  Marland Kitchen EPINEPHrine 0.3 mg/0.3 mL IJ SOAJ injection Inject 0.3 mg into the muscle as needed (allergic reaction).  . fentaNYL (DURAGESIC) 50 MCG/HR Place 1 patch onto the skin  every 3 (three) days.  . furosemide (LASIX) 40 MG tablet TAKE 1 TABLET(40 MG) BY MOUTH DAILY  . hydrocortisone (CORTEF) 10 MG tablet TAKE 1 to 2 TABLETS BY MOUTH EVERY MORNING AND 1 TABLET AT 5 PM EVERY DAY  . levothyroxine (SYNTHROID) 137 MCG tablet TAKE 2 TABLETS BY MOUTH  DAILY BEFORE BREAKFAST AND TAKE 1 EXTRA TABLET WEEKLY  . lidocaine (LIDODERM) 5 % Place 1 patch onto the skin daily. Remove & Discard patch within 12 hours or as directed by MD  . Multiple Vitamin (MULTIVITAMIN WITH MINERALS) TABS tablet Take 1 tablet by mouth daily.  Marland Kitchen NEEDLE, DISP, 22 G 22G X 1" MISC Use every 14 days  . oxyCODONE-acetaminophen (PERCOCET) 10-325 MG tablet Take 1 tablet by mouth every 4 (four) hours as needed for pain.  . OXYCONTIN 20 MG 12 hr tablet Take 20 mg by mouth every 12 (twelve) hours.  . polyethylene glycol (MIRALAX / GLYCOLAX) 17 g packet Take 17 g by mouth daily.  . potassium chloride (K-DUR) 10 MEQ tablet Take 1 tablet (10 mEq total) by mouth daily.  . predniSONE (DELTASONE) 10 MG tablet Take 2 tablets (20 mg total) by mouth daily.  . rosuvastatin (CRESTOR) 20 MG tablet Take 1 tablet (20 mg total) by mouth daily.  Marland Kitchen senna (SENOKOT) 8.6 MG TABS tablet Take 1 tablet  (8.6 mg total) by mouth 2 (two) times daily.  . Syringe/Needle, Disp, (SYRINGE 3CC/21GX1") 21G X 1" 3 ML MISC Use every 14 days  . Testosterone Cypionate 200 MG/ML SOLN Inject 120 mg into the muscle every 14 (fourteen) days.  Marland Kitchen topiramate (TOPAMAX) 50 MG tablet TAKE 1 TABLET BY MOUTH  TWICE DAILY  . XTAMPZA ER 36 MG C12A Take 1 capsule by mouth every 12 (twelve) hours.   No facility-administered encounter medications on file as of 09/29/2020.    PHYSICAL EXAM:   General: NAD, frail appearing, thin Cardiovascular: regular rate and rhythm Pulmonary: clear ant fields Abdomen: soft, nontender, + bowel sounds GU: no suprapubic tenderness Extremities: no edema, no joint deformities Skin: no rashes Neurological: Weakness but otherwise nonfocal  Honestie Kulik Jenetta Downer, NP

## 2020-09-30 DIAGNOSIS — G894 Chronic pain syndrome: Secondary | ICD-10-CM | POA: Diagnosis not present

## 2020-09-30 DIAGNOSIS — M109 Gout, unspecified: Secondary | ICD-10-CM | POA: Diagnosis not present

## 2020-09-30 DIAGNOSIS — M1711 Unilateral primary osteoarthritis, right knee: Secondary | ICD-10-CM | POA: Diagnosis not present

## 2020-09-30 DIAGNOSIS — M47896 Other spondylosis, lumbar region: Secondary | ICD-10-CM | POA: Diagnosis not present

## 2020-09-30 DIAGNOSIS — M2391 Unspecified internal derangement of right knee: Secondary | ICD-10-CM | POA: Diagnosis not present

## 2020-09-30 DIAGNOSIS — G20 Parkinson's disease: Secondary | ICD-10-CM | POA: Diagnosis not present

## 2020-10-01 ENCOUNTER — Telehealth: Payer: Self-pay | Admitting: Adult Health Nurse Practitioner

## 2020-10-01 NOTE — Telephone Encounter (Signed)
One late entry.  Spoke with patient yesterday.  He was having questions about starting lexapro for being "off the chain" since being back home.  Advised that he may be having side effects of too many opioids with fentanyl and Xtampza.  Advised to stop one dose a day of the Xtampza for 3-4 days and then stop the last dose so that he would be just taking the fentanyl.  He expressed understanding.   Today he called again and stated that yesterday he was vomiting and he felt really sick.  He has not vomited today.  His last dose of Xtampza was yesterday morning and he is still having good pain control.  He states that with the vomiting he did have a piece of strawberry cake that did not taste good.  Not sure what caused the vomiting but it could be what he ate. Will call patient in 4 days to follow up. Amy K. Olena Heckle NP

## 2020-10-02 DIAGNOSIS — M109 Gout, unspecified: Secondary | ICD-10-CM | POA: Diagnosis not present

## 2020-10-02 DIAGNOSIS — J42 Unspecified chronic bronchitis: Secondary | ICD-10-CM

## 2020-10-02 DIAGNOSIS — G4733 Obstructive sleep apnea (adult) (pediatric): Secondary | ICD-10-CM

## 2020-10-02 DIAGNOSIS — E039 Hypothyroidism, unspecified: Secondary | ICD-10-CM

## 2020-10-02 DIAGNOSIS — I5032 Chronic diastolic (congestive) heart failure: Secondary | ICD-10-CM | POA: Diagnosis not present

## 2020-10-02 DIAGNOSIS — I110 Hypertensive heart disease with heart failure: Secondary | ICD-10-CM | POA: Diagnosis not present

## 2020-10-02 DIAGNOSIS — Z9682 Presence of neurostimulator: Secondary | ICD-10-CM

## 2020-10-02 DIAGNOSIS — I252 Old myocardial infarction: Secondary | ICD-10-CM | POA: Diagnosis not present

## 2020-10-02 DIAGNOSIS — M2391 Unspecified internal derangement of right knee: Secondary | ICD-10-CM | POA: Diagnosis not present

## 2020-10-02 DIAGNOSIS — I2510 Atherosclerotic heart disease of native coronary artery without angina pectoris: Secondary | ICD-10-CM | POA: Diagnosis not present

## 2020-10-02 DIAGNOSIS — M1711 Unilateral primary osteoarthritis, right knee: Secondary | ICD-10-CM | POA: Diagnosis not present

## 2020-10-02 DIAGNOSIS — E871 Hypo-osmolality and hyponatremia: Secondary | ICD-10-CM

## 2020-10-02 DIAGNOSIS — Z8546 Personal history of malignant neoplasm of prostate: Secondary | ICD-10-CM

## 2020-10-02 DIAGNOSIS — Z6837 Body mass index (BMI) 37.0-37.9, adult: Secondary | ICD-10-CM

## 2020-10-02 DIAGNOSIS — I872 Venous insufficiency (chronic) (peripheral): Secondary | ICD-10-CM | POA: Diagnosis not present

## 2020-10-02 DIAGNOSIS — Z87442 Personal history of urinary calculi: Secondary | ICD-10-CM

## 2020-10-02 DIAGNOSIS — I480 Paroxysmal atrial fibrillation: Secondary | ICD-10-CM

## 2020-10-02 DIAGNOSIS — E236 Other disorders of pituitary gland: Secondary | ICD-10-CM

## 2020-10-02 DIAGNOSIS — E271 Primary adrenocortical insufficiency: Secondary | ICD-10-CM | POA: Diagnosis not present

## 2020-10-02 DIAGNOSIS — E230 Hypopituitarism: Secondary | ICD-10-CM

## 2020-10-02 DIAGNOSIS — Z8673 Personal history of transient ischemic attack (TIA), and cerebral infarction without residual deficits: Secondary | ICD-10-CM

## 2020-10-02 DIAGNOSIS — G894 Chronic pain syndrome: Secondary | ICD-10-CM | POA: Diagnosis not present

## 2020-10-02 DIAGNOSIS — G20 Parkinson's disease: Secondary | ICD-10-CM | POA: Diagnosis not present

## 2020-10-02 DIAGNOSIS — M47896 Other spondylosis, lumbar region: Secondary | ICD-10-CM | POA: Diagnosis not present

## 2020-10-06 ENCOUNTER — Telehealth: Payer: Self-pay | Admitting: Adult Health Nurse Practitioner

## 2020-10-06 NOTE — Telephone Encounter (Signed)
This is a late entry.  Talked with patient yesterday, 10/05/2020. Call patient to follow-up on how he was doing with decreasing his Xtampza ER since being started on fentanyl.  States that he is doing well wife and had concerns about his mood.  He states that he is fine.  Also spoke with wife who states that when he tried to decrease the [facility] ER that the pain did come back.  I have told her that he can continue the [facility] ER twice daily for pain control.  She does state that he has had more of a temper since coming home and is not sure why.  They are going to the beach for a few days and she is hoping that the change in scenery will help him.  Have encouraged her to reach out to his PCP and neurologist if he continues to have outbursts with his temper.  Do have follow-up visit in 4 weeks.  Encouraged to call with any questions or concerns. Fayola Meckes K.  Olena Heckle NP

## 2020-10-12 ENCOUNTER — Telehealth: Payer: Self-pay | Admitting: *Deleted

## 2020-10-12 NOTE — Telephone Encounter (Signed)
Tommy PT with [facility] left a voicemail stating that he was calling to report that patient missed 2 PT home visits last week. Konrad Dolores stated that patient was out of town at [employer] on vacation. Konrad Dolores stated that he wanted to doctor to be aware.

## 2020-10-12 NOTE — Telephone Encounter (Signed)
Noted  

## 2020-10-15 DIAGNOSIS — M47896 Other spondylosis, lumbar region: Secondary | ICD-10-CM | POA: Diagnosis not present

## 2020-10-15 DIAGNOSIS — G894 Chronic pain syndrome: Secondary | ICD-10-CM | POA: Diagnosis not present

## 2020-10-15 DIAGNOSIS — M1711 Unilateral primary osteoarthritis, right knee: Secondary | ICD-10-CM | POA: Diagnosis not present

## 2020-10-15 DIAGNOSIS — M2391 Unspecified internal derangement of right knee: Secondary | ICD-10-CM | POA: Diagnosis not present

## 2020-10-15 DIAGNOSIS — G20 Parkinson's disease: Secondary | ICD-10-CM | POA: Diagnosis not present

## 2020-10-15 DIAGNOSIS — M109 Gout, unspecified: Secondary | ICD-10-CM | POA: Diagnosis not present

## 2020-10-19 ENCOUNTER — Other Ambulatory Visit: Payer: Medicare Other | Admitting: Adult Health Nurse Practitioner

## 2020-10-19 ENCOUNTER — Other Ambulatory Visit: Payer: Self-pay

## 2020-10-19 DIAGNOSIS — I872 Venous insufficiency (chronic) (peripheral): Secondary | ICD-10-CM | POA: Diagnosis not present

## 2020-10-19 DIAGNOSIS — I480 Paroxysmal atrial fibrillation: Secondary | ICD-10-CM | POA: Diagnosis not present

## 2020-10-19 DIAGNOSIS — Z515 Encounter for palliative care: Secondary | ICD-10-CM

## 2020-10-19 DIAGNOSIS — I5032 Chronic diastolic (congestive) heart failure: Secondary | ICD-10-CM | POA: Diagnosis not present

## 2020-10-19 DIAGNOSIS — M5442 Lumbago with sciatica, left side: Secondary | ICD-10-CM | POA: Diagnosis not present

## 2020-10-19 DIAGNOSIS — M109 Gout, unspecified: Secondary | ICD-10-CM | POA: Diagnosis not present

## 2020-10-19 DIAGNOSIS — M2391 Unspecified internal derangement of right knee: Secondary | ICD-10-CM | POA: Diagnosis not present

## 2020-10-19 DIAGNOSIS — M159 Polyosteoarthritis, unspecified: Secondary | ICD-10-CM | POA: Diagnosis not present

## 2020-10-19 DIAGNOSIS — G20 Parkinson's disease: Secondary | ICD-10-CM | POA: Diagnosis not present

## 2020-10-19 DIAGNOSIS — G4733 Obstructive sleep apnea (adult) (pediatric): Secondary | ICD-10-CM | POA: Diagnosis not present

## 2020-10-19 DIAGNOSIS — E230 Hypopituitarism: Secondary | ICD-10-CM | POA: Diagnosis not present

## 2020-10-19 DIAGNOSIS — G8929 Other chronic pain: Secondary | ICD-10-CM

## 2020-10-19 DIAGNOSIS — M1711 Unilateral primary osteoarthritis, right knee: Secondary | ICD-10-CM | POA: Diagnosis not present

## 2020-10-19 DIAGNOSIS — I110 Hypertensive heart disease with heart failure: Secondary | ICD-10-CM | POA: Diagnosis not present

## 2020-10-19 DIAGNOSIS — M5441 Lumbago with sciatica, right side: Secondary | ICD-10-CM | POA: Diagnosis not present

## 2020-10-19 DIAGNOSIS — Z9682 Presence of neurostimulator: Secondary | ICD-10-CM | POA: Diagnosis not present

## 2020-10-19 DIAGNOSIS — M47896 Other spondylosis, lumbar region: Secondary | ICD-10-CM | POA: Diagnosis not present

## 2020-10-19 DIAGNOSIS — E271 Primary adrenocortical insufficiency: Secondary | ICD-10-CM | POA: Diagnosis not present

## 2020-10-19 DIAGNOSIS — G894 Chronic pain syndrome: Secondary | ICD-10-CM | POA: Diagnosis not present

## 2020-10-19 DIAGNOSIS — Z8673 Personal history of transient ischemic attack (TIA), and cerebral infarction without residual deficits: Secondary | ICD-10-CM | POA: Diagnosis not present

## 2020-10-19 DIAGNOSIS — I2510 Atherosclerotic heart disease of native coronary artery without angina pectoris: Secondary | ICD-10-CM | POA: Diagnosis not present

## 2020-10-19 DIAGNOSIS — Z8546 Personal history of malignant neoplasm of prostate: Secondary | ICD-10-CM | POA: Diagnosis not present

## 2020-10-19 DIAGNOSIS — J42 Unspecified chronic bronchitis: Secondary | ICD-10-CM | POA: Diagnosis not present

## 2020-10-19 DIAGNOSIS — E871 Hypo-osmolality and hyponatremia: Secondary | ICD-10-CM | POA: Diagnosis not present

## 2020-10-19 DIAGNOSIS — Z6837 Body mass index (BMI) 37.0-37.9, adult: Secondary | ICD-10-CM | POA: Diagnosis not present

## 2020-10-19 DIAGNOSIS — E236 Other disorders of pituitary gland: Secondary | ICD-10-CM | POA: Diagnosis not present

## 2020-10-19 DIAGNOSIS — I252 Old myocardial infarction: Secondary | ICD-10-CM | POA: Diagnosis not present

## 2020-10-19 DIAGNOSIS — Z87442 Personal history of urinary calculi: Secondary | ICD-10-CM | POA: Diagnosis not present

## 2020-10-19 DIAGNOSIS — E039 Hypothyroidism, unspecified: Secondary | ICD-10-CM | POA: Diagnosis not present

## 2020-10-19 NOTE — Progress Notes (Signed)
[facility] Consult Note Telephone: [phone]  Fax: [phone]    Date of encounter: 10/19/20 PATIENT NAME: Dennis [name] [place] [address]   [phone] (home)  DOB: 19-Feb-1946 MRN: [id number] PRIMARY CARE PROVIDER:    Jinny Sanders, MD,  [place] [address] [phone]  REFERRING PROVIDER:   Jinny Sanders, MD [address] [place],  [place] [address] [phone]  RESPONSIBLE PARTY:    Contact Information    Name Relation Home Work Mobile   Dennis [name],Dennis [name] Spouse [phone]  [phone]   Dennis [name],Dennis [name] [phone]        Due to the COVID-19 crisis, this visit was done via telemedicine from my office and it was initiated and consent by this patient and or family.  Originally had in person appointment scheduled for 10/27/20 but patient will be out of town and has agreed to telemedicine appointment today.  I connected with  Dennis Nations Govea Sr. on 10/19/20 by telemedicine and verified that I am speaking with the correct person using two identifiers.   I discussed the limitations of evaluation and management by telemedicine. The patient expressed understanding and agreed to proceed.   Palliative Care was asked to follow this patient by consultation request of  Jinny Sanders, MD to address advance care planning and complex medical decision making. This is a follow up visit.                                   ASSESSMENT AND PLAN / RECOMMENDATIONS:   Advance Care Planning/Goals of Care: Goals include to maximize quality of life and symptom management.   CODE STATUS: DNR  Symptom Management/Plan:  Pain related to multiple sources.  Attempted to decrease Xtampza and pain came back.  Keeping fentanyl 50 mcg/hr patch every 3 days and Xtampza 36 mg Q 12 hours and oxycodone 10 mg Q 4 hours PRN.  Sent in one month supply for each of these to [facility] on [address] in [place].  Checked [place] substance abuse data base with no concerns.    Constipation.  Gets good relief with daily Miralax.  Encouraged to continue this   Follow up Palliative Care Visit: Palliative care will continue to follow for complex medical decision making, advance care planning, and clarification of goals. Will call to schedule next appointment when patient gets back in town.  Encouraged to call with any questions or concerns.  I spent 40 minutes providing this consultation. More than 50% of the time in this consultation was spent in counseling and care coordination.   PPS: 50%  HOSPICE ELIGIBILITY/DIAGNOSIS: TBD  Chief Complaint: follow up palliative visit/pain  HISTORY OF PRESENT ILLNESS:  Dennis Testa Valencia Sr. is a 75 y.o. year old male  with Parkinson's disease, CHF, Addison's disease, CVA, MI, sleep apnea, hypertension, hyperlipidemia, hypothyroidism, gout, history of testicular cancer and prostate cancer. Patient states that he tried weaning off of the Xtampza  36 mg Q 12 hours but the pain was worsening and he started back on it.  He is having better mobility and is walking more with decreased pain.  Has been able to walk up and down steps. Continues to work with PT.  Wife does state that his mood has improved with better pain management.  He has had a couple days of nausea and vomiting.  Wife believes that this occurs when he takes his sinemet with food.  He does better when he takes the sinemet 30 minutes prior to eating per wife.  Encouraged to continue the sinemet 30 minutes prior to eating.  He states that he does get constipated if he does not take Miralax daily.  He has been taking the Miralax daily.  Patient reports he has not had any falls.  Rest of 10 ROS asked and negative, except what is in HPI.    History obtained from review of EMR, interview with familly and/or Dennis [name].   Physical Exam: Deferred due to telemedicine visit   Thank you for the opportunity to  participate in the care of Dennis [name].  The palliative care team will continue to follow. Please call our office at [phone] if we can be of additional assistance.   Amedio Bowlby Jenetta Downer, NP , DNP, AGPCNP-BC

## 2020-10-27 ENCOUNTER — Other Ambulatory Visit: Payer: Medicare Other | Admitting: Adult Health Nurse Practitioner

## 2020-11-03 ENCOUNTER — Telehealth: Payer: Self-pay | Admitting: Adult Health Nurse Practitioner

## 2020-11-03 NOTE — Telephone Encounter (Signed)
Called patient to schedule follow up visit.  Having to use more PRN oxycodone.  Scheduled follow up visit for 11/11/20 @ 1:30pm.  Encouraged to call with any questions or concerns. Zareah Hunzeker K. Olena Heckle NP

## 2020-11-04 ENCOUNTER — Other Ambulatory Visit: Payer: Medicare Other | Admitting: Adult Health Nurse Practitioner

## 2020-11-04 ENCOUNTER — Encounter: Payer: Self-pay | Admitting: Adult Health Nurse Practitioner

## 2020-11-04 ENCOUNTER — Other Ambulatory Visit: Payer: Self-pay

## 2020-11-04 DIAGNOSIS — G20 Parkinson's disease: Secondary | ICD-10-CM | POA: Diagnosis not present

## 2020-11-04 DIAGNOSIS — M47896 Other spondylosis, lumbar region: Secondary | ICD-10-CM | POA: Diagnosis not present

## 2020-11-04 DIAGNOSIS — M159 Polyosteoarthritis, unspecified: Secondary | ICD-10-CM | POA: Diagnosis not present

## 2020-11-04 DIAGNOSIS — G8929 Other chronic pain: Secondary | ICD-10-CM

## 2020-11-04 DIAGNOSIS — M2391 Unspecified internal derangement of right knee: Secondary | ICD-10-CM | POA: Diagnosis not present

## 2020-11-04 DIAGNOSIS — M109 Gout, unspecified: Secondary | ICD-10-CM | POA: Diagnosis not present

## 2020-11-04 DIAGNOSIS — M5442 Lumbago with sciatica, left side: Secondary | ICD-10-CM | POA: Diagnosis not present

## 2020-11-04 DIAGNOSIS — M1711 Unilateral primary osteoarthritis, right knee: Secondary | ICD-10-CM | POA: Diagnosis not present

## 2020-11-04 DIAGNOSIS — G894 Chronic pain syndrome: Secondary | ICD-10-CM | POA: Diagnosis not present

## 2020-11-04 DIAGNOSIS — M5441 Lumbago with sciatica, right side: Secondary | ICD-10-CM | POA: Diagnosis not present

## 2020-11-04 DIAGNOSIS — Z515 Encounter for palliative care: Secondary | ICD-10-CM | POA: Diagnosis not present

## 2020-11-04 NOTE — Progress Notes (Signed)
    [facility] Consult Note Telephone: [phone]  Fax: [phone]    Date of encounter: 11/04/20 PATIENT NAME: Dennis [name] [place] Sr. [place] [address]   [phone] (home)  DOB: 11-05-1945 MRN: [id number] PRIMARY CARE PROVIDER:    Jinny Sanders, MD,  [place] [address] [phone]  REFERRING PROVIDER:   Jinny Sanders, MD [address] [place],  [place] [address] [phone]  RESPONSIBLE PARTY:    Contact Information    Name Relation Home Work Mobile   Nagele,Cheryl Spouse [phone]  [phone]   Dillin,Tyre Pandora Leiter [phone]         Palliative Care was asked to follow this patient by consultation request of  Jinny Sanders, MD to address advance care planning and complex medical decision making. This is a follow up visit.  Due to the COVID-19 crisis, this telephone evaluation and treatment contact was done via telephone and it was initiated and consent by this patient and or family.                                   ASSESSMENT AND PLAN / RECOMMENDATIONS:   Advance Care Planning/Goals of Care: Goals include to maximize quality of life and symptom management.   CODE STATUS: DNR  Symptom Management/Plan:  Chronic pain: Increased fentanyl transdermal patch to 75 mcg/h every 72 hours.  Sent this to his pharmacy, [facility] on [address] in [place].  Have appointment next week for follow-up.   Follow up Palliative Care Visit: Palliative care will continue to follow for complex medical decision making, advance care planning, and clarification of goals. Return 1 week or prn. Encouraged to call with any questions or concerns  I spent 30 minutes providing this consultation. More than 50% of the time in this consultation was spent in counseling and care coordination.   PPS: 40%  HOSPICE ELIGIBILITY/DIAGNOSIS: TBD  Chief Complaint: follow up palliative  visit/pain  HISTORY OF PRESENT ILLNESS:  Dennis Tuazon Bukhari Sr. is a 75 y.o. year old male  with Parkinson's disease, CHF, Addison's disease, CVA, MI, sleep apnea, hypertension, hyperlipidemia, hypothyroidism, gout, history of testicular cancer and prostate cancer.  Patient is having increased pain.  States it is similar pain that he has had in the past with his back, hips, and knees.  States having sciatic pain.  States that PT would not work with him today due to his pain and blood pressure elevated at 140/96.  PT instructed him not to get up without assistance.  Patient denies any falls or trauma.  He has been having to use more of his as needed oxycodone  History obtained from review of EMR and interview with family and Mr. Wise.   Physical Exam: Deferred  Thank you for the opportunity to participate in the care of Mr. Taddei.  The palliative care team will continue to follow. Please call our office at [phone] if we can be of additional assistance.   Nury Nebergall Jenetta Downer, NP , DNP  This chart was dictated using voice recognition software. Despite best efforts to proofread, errors can occur which can change the documentation meaning.

## 2020-11-11 ENCOUNTER — Telehealth: Payer: Self-pay | Admitting: Family Medicine

## 2020-11-11 ENCOUNTER — Encounter: Payer: Self-pay | Admitting: Adult Health Nurse Practitioner

## 2020-11-11 ENCOUNTER — Other Ambulatory Visit: Payer: Self-pay

## 2020-11-11 ENCOUNTER — Other Ambulatory Visit: Payer: Medicare Other | Admitting: Adult Health Nurse Practitioner

## 2020-11-11 VITALS — BP 142/88 | HR 78

## 2020-11-11 DIAGNOSIS — R634 Abnormal weight loss: Secondary | ICD-10-CM

## 2020-11-11 DIAGNOSIS — G8929 Other chronic pain: Secondary | ICD-10-CM | POA: Diagnosis not present

## 2020-11-11 DIAGNOSIS — G20 Parkinson's disease: Secondary | ICD-10-CM

## 2020-11-11 DIAGNOSIS — M5442 Lumbago with sciatica, left side: Secondary | ICD-10-CM

## 2020-11-11 DIAGNOSIS — M159 Polyosteoarthritis, unspecified: Secondary | ICD-10-CM | POA: Diagnosis not present

## 2020-11-11 DIAGNOSIS — Z515 Encounter for palliative care: Secondary | ICD-10-CM

## 2020-11-11 DIAGNOSIS — M5441 Lumbago with sciatica, right side: Secondary | ICD-10-CM | POA: Diagnosis not present

## 2020-11-11 NOTE — Telephone Encounter (Signed)
Noted.  Medication list updated. FYI to Dr. Bedsole.  

## 2020-11-11 NOTE — Progress Notes (Signed)
[facility] Consult Note Telephone: [phone]  Fax: [phone]    Date of encounter: 11/11/20 PATIENT NAME: Nolon Nations Assad Sr. [place] [address]   [phone] (home)  DOB: 1945-09-13 MRN: [id number] PRIMARY CARE PROVIDER:    Jinny Sanders, MD,  [place] [address] [phone]  REFERRING PROVIDER:   Jinny Sanders, MD [address] [place],   [address] [phone]  RESPONSIBLE PARTY:    Contact Information    Name Relation Home Work Mobile   Kruk,Cheryl Spouse [phone]  [phone]   Graveline,Romir Pandora Leiter [phone]         I met face to face with patient and family in home. Palliative Care was asked to follow this patient by consultation request of  Jinny Sanders, MD to address advance care planning and complex medical decision making. This is a follow up visit.  Son present during visit today and wife joined visit via telephone.                                   ASSESSMENT AND PLAN / RECOMMENDATIONS:   Advance Care Planning/Goals of Care: Goals include to maximize quality of life and symptom management.   CODE STATUS: DNR  Symptom Management/Plan:  Chronic pain: This is related to chronic low back pain and osteoarthritis of multiple joints.  Patient started having increased pain and was using his breakthrough oxycodone more often.  Had increased fentanyl patch to 75 mcg/h every 3 days last week.  He is getting good relief with this increase. Continue current pain regimen.  Do have concerns that he gets good relief and then starts having uncontrolled pain after about 2 months.  Will consult with colleagues if having to continue go up on opioid doses to maintain adequate pain control.  Parkinson's:  Patient having increased tremors.  Encouraged to call neurologist and set up appointment.  He expresses that he will.    Weight loss:  Have encouraged smaller  more frequent meals and adding supplementation such as Boost.  He does occasionally supplement with Boost and protein bars.     Follow up Palliative Care Visit: Palliative care will continue to follow for complex medical decision making, advance care planning, and clarification of goals. Return 4 weeks or prn.  I spent 45 minutes providing this consultation. More than 50% of the time in this consultation was spent in counseling and care coordination.   PPS: 40%  HOSPICE ELIGIBILITY/DIAGNOSIS: TBD  Chief Complaint: Follow up palliative visit  HISTORY OF PRESENT ILLNESS:  Jacy Howat Batchelder Sr. is a 75 y.o. year old male  with Parkinson's disease, CHF, Addison's disease, CVA, MI, sleep apnea, hypertension, hyperlipidemia, hypothyroidism, gout, history of testicular cancer and prostate cancer.  Patient's pain is better relieved with the 75 mcg/h fentanyl patch.  Continues Extampza 36 mg twice daily.  Has only been needing his breakthrough oxycodone once or twice per day when needed.  Does state that his physical therapist does want him to use a cane for more stability.  Denies falls.  Patient does have a cold right now and is getting relief with DayQuil/NyQuil.  States that was at a family gathering and one of his nephews had a cold.  His wife and son have had a cold since this encounter and now he has it.  Patient is having  more tremors.  Denies freezing episodes or shuffling gait.  Has had to miss his last neurology appointment when he was hospitalized and then in short-term rehab.  States that he needs to call and reschedule an appointment.  Patient voices continuing to lose weight and decreased appetite states that 6 months ago his waist size was 56 inches and now he is wearing 48 waist.  History obtained from review of EMR and interview with family and Mr. Farnell.    PHYSICAL EXAM:  General: NAD, frail appearing Eyes: Sclera anicteric and noninjected with no discharge noted ENMT: moist mucous  membranes Cardiovascular: regular rate and rhythm Pulmonary:lung sounds clear; normal respiratory effort Abdomen: soft, nontender,hypoactivebowel sounds Extremities:traceedema to bilateral feet, no joint deformities Skin: no rasheson exposed skin Neurological: Weakness otherwise nonfocal  Thank you for the opportunity to participate in the care of Mr. Kuhlman.  The palliative care team will continue to follow. Please call our office at [phone] if we can be of additional assistance.   Adeli Frost Jenetta Downer, NP , DNP  This chart was dictated using voice recognition software. Despite best efforts to proofread, errors can occur which can change the documentation meaning.   COVID-19 PATIENT SCREENING TOOL Asked and negative response unless otherwise noted:   Have you had symptoms of covid, tested positive or been in contact with someone with symptoms/positive test in the past 5-10 days? negative

## 2020-11-11 NOTE — Telephone Encounter (Signed)
Mr. Dennis [name] called and stated that the Fentanyl dosage has changed to 75mg  and he is under hospice care for all pain medications. And the NP Amy Olena Heckle is the one prescribed the pain medications

## 2020-11-13 ENCOUNTER — Telehealth: Payer: Self-pay | Admitting: Family Medicine

## 2020-11-13 DIAGNOSIS — R259 Unspecified abnormal involuntary movements: Secondary | ICD-10-CM | POA: Diagnosis not present

## 2020-11-13 NOTE — Telephone Encounter (Signed)
Spoke to Talty at [facility] and gave verbal orders, per Dr Diona Browner, for PT to visit pt at home.

## 2020-11-13 NOTE — Telephone Encounter (Signed)
Tiffany called in and stated that they were suppose to come out to see him but he had a visit at the [facility] and denied for them to call out to the home.  The therapist wants to see him on 5/10. And they are needing orders to come out to the home.   Please advise

## 2020-11-13 NOTE — Telephone Encounter (Signed)
Okay to give PT orders as requested.

## 2020-11-17 ENCOUNTER — Telehealth: Payer: Self-pay

## 2020-11-17 DIAGNOSIS — M2391 Unspecified internal derangement of right knee: Secondary | ICD-10-CM | POA: Diagnosis not present

## 2020-11-17 DIAGNOSIS — M47896 Other spondylosis, lumbar region: Secondary | ICD-10-CM | POA: Diagnosis not present

## 2020-11-17 DIAGNOSIS — G20 Parkinson's disease: Secondary | ICD-10-CM | POA: Diagnosis not present

## 2020-11-17 DIAGNOSIS — M109 Gout, unspecified: Secondary | ICD-10-CM | POA: Diagnosis not present

## 2020-11-17 DIAGNOSIS — G894 Chronic pain syndrome: Secondary | ICD-10-CM | POA: Diagnosis not present

## 2020-11-17 DIAGNOSIS — M1711 Unilateral primary osteoarthritis, right knee: Secondary | ICD-10-CM | POA: Diagnosis not present

## 2020-11-17 NOTE — Telephone Encounter (Signed)
It would be best to review meds with his palliative care provider as they made many changes recently including with pain meds. Her number is in the chart.

## 2020-11-17 NOTE — Telephone Encounter (Signed)
Gerald Stabs PT with [facility] called requesting VO for PT 1 time a week for 8 weeks... please advise

## 2020-11-17 NOTE — Telephone Encounter (Signed)
Tiffany with [facility] said when opened [facility] services there were several meds with med discrepancies that include meds that are on the medication list sent to [facility] that the pt is not taking or meds that the pt has at home and taking but is not on med list. The med discrepancies include Hydrocortisone, miralax, senna,oxycodone with tylenol, xtampza,cyclobenzaprine,epinephrine, allopurinol, and primidone or mysoline (pt was taking primidone at home but is not on med list). Also has question about protein supplements being on allergy list. Tiffany request cb after reviewed by Dr Diona Browner.

## 2020-11-17 NOTE — Telephone Encounter (Signed)
Verbal orders given to [facility] for PT 1 x week for 8 weeks per Dr. Diona Browner.

## 2020-11-17 NOTE — Telephone Encounter (Signed)
Okay to give verbal orders as requested. 

## 2020-11-18 DIAGNOSIS — G894 Chronic pain syndrome: Secondary | ICD-10-CM | POA: Diagnosis not present

## 2020-11-18 DIAGNOSIS — E230 Hypopituitarism: Secondary | ICD-10-CM | POA: Diagnosis not present

## 2020-11-18 DIAGNOSIS — Z9682 Presence of neurostimulator: Secondary | ICD-10-CM | POA: Diagnosis not present

## 2020-11-18 DIAGNOSIS — E271 Primary adrenocortical insufficiency: Secondary | ICD-10-CM | POA: Diagnosis not present

## 2020-11-18 DIAGNOSIS — I110 Hypertensive heart disease with heart failure: Secondary | ICD-10-CM | POA: Diagnosis not present

## 2020-11-18 DIAGNOSIS — E236 Other disorders of pituitary gland: Secondary | ICD-10-CM | POA: Diagnosis not present

## 2020-11-18 DIAGNOSIS — M47896 Other spondylosis, lumbar region: Secondary | ICD-10-CM | POA: Diagnosis not present

## 2020-11-18 DIAGNOSIS — M1711 Unilateral primary osteoarthritis, right knee: Secondary | ICD-10-CM | POA: Diagnosis not present

## 2020-11-18 DIAGNOSIS — I5032 Chronic diastolic (congestive) heart failure: Secondary | ICD-10-CM | POA: Diagnosis not present

## 2020-11-18 DIAGNOSIS — G4733 Obstructive sleep apnea (adult) (pediatric): Secondary | ICD-10-CM | POA: Diagnosis not present

## 2020-11-18 DIAGNOSIS — I252 Old myocardial infarction: Secondary | ICD-10-CM | POA: Diagnosis not present

## 2020-11-18 DIAGNOSIS — J42 Unspecified chronic bronchitis: Secondary | ICD-10-CM | POA: Diagnosis not present

## 2020-11-18 DIAGNOSIS — E039 Hypothyroidism, unspecified: Secondary | ICD-10-CM | POA: Diagnosis not present

## 2020-11-18 DIAGNOSIS — F0280 Dementia in other diseases classified elsewhere without behavioral disturbance: Secondary | ICD-10-CM | POA: Diagnosis not present

## 2020-11-18 DIAGNOSIS — I480 Paroxysmal atrial fibrillation: Secondary | ICD-10-CM | POA: Diagnosis not present

## 2020-11-18 DIAGNOSIS — I2510 Atherosclerotic heart disease of native coronary artery without angina pectoris: Secondary | ICD-10-CM | POA: Diagnosis not present

## 2020-11-18 DIAGNOSIS — Z8673 Personal history of transient ischemic attack (TIA), and cerebral infarction without residual deficits: Secondary | ICD-10-CM | POA: Diagnosis not present

## 2020-11-18 DIAGNOSIS — M2391 Unspecified internal derangement of right knee: Secondary | ICD-10-CM | POA: Diagnosis not present

## 2020-11-18 DIAGNOSIS — M109 Gout, unspecified: Secondary | ICD-10-CM | POA: Diagnosis not present

## 2020-11-18 DIAGNOSIS — Z96652 Presence of left artificial knee joint: Secondary | ICD-10-CM | POA: Diagnosis not present

## 2020-11-18 DIAGNOSIS — Z6837 Body mass index (BMI) 37.0-37.9, adult: Secondary | ICD-10-CM | POA: Diagnosis not present

## 2020-11-18 DIAGNOSIS — G20 Parkinson's disease: Secondary | ICD-10-CM | POA: Diagnosis not present

## 2020-11-18 DIAGNOSIS — E871 Hypo-osmolality and hyponatremia: Secondary | ICD-10-CM | POA: Diagnosis not present

## 2020-11-18 DIAGNOSIS — I872 Venous insufficiency (chronic) (peripheral): Secondary | ICD-10-CM | POA: Diagnosis not present

## 2020-11-18 NOTE — Telephone Encounter (Signed)
Tiffany notified as instructed by telephone.  I provided Hanley Ben, NP name and number [phone]) for Palliative Care.

## 2020-11-20 DIAGNOSIS — Z6837 Body mass index (BMI) 37.0-37.9, adult: Secondary | ICD-10-CM

## 2020-11-20 DIAGNOSIS — F0280 Dementia in other diseases classified elsewhere without behavioral disturbance: Secondary | ICD-10-CM | POA: Diagnosis not present

## 2020-11-20 DIAGNOSIS — G20 Parkinson's disease: Secondary | ICD-10-CM | POA: Diagnosis not present

## 2020-11-20 DIAGNOSIS — Z9682 Presence of neurostimulator: Secondary | ICD-10-CM

## 2020-11-20 DIAGNOSIS — I110 Hypertensive heart disease with heart failure: Secondary | ICD-10-CM | POA: Diagnosis not present

## 2020-11-20 DIAGNOSIS — M1711 Unilateral primary osteoarthritis, right knee: Secondary | ICD-10-CM | POA: Diagnosis not present

## 2020-11-20 DIAGNOSIS — M47896 Other spondylosis, lumbar region: Secondary | ICD-10-CM | POA: Diagnosis not present

## 2020-11-20 DIAGNOSIS — Z8673 Personal history of transient ischemic attack (TIA), and cerebral infarction without residual deficits: Secondary | ICD-10-CM

## 2020-11-20 DIAGNOSIS — M2391 Unspecified internal derangement of right knee: Secondary | ICD-10-CM | POA: Diagnosis not present

## 2020-11-20 DIAGNOSIS — I2510 Atherosclerotic heart disease of native coronary artery without angina pectoris: Secondary | ICD-10-CM | POA: Diagnosis not present

## 2020-11-20 DIAGNOSIS — E236 Other disorders of pituitary gland: Secondary | ICD-10-CM

## 2020-11-20 DIAGNOSIS — E871 Hypo-osmolality and hyponatremia: Secondary | ICD-10-CM

## 2020-11-20 DIAGNOSIS — E039 Hypothyroidism, unspecified: Secondary | ICD-10-CM

## 2020-11-20 DIAGNOSIS — M109 Gout, unspecified: Secondary | ICD-10-CM | POA: Diagnosis not present

## 2020-11-20 DIAGNOSIS — J42 Unspecified chronic bronchitis: Secondary | ICD-10-CM

## 2020-11-20 DIAGNOSIS — I872 Venous insufficiency (chronic) (peripheral): Secondary | ICD-10-CM | POA: Diagnosis not present

## 2020-11-20 DIAGNOSIS — I252 Old myocardial infarction: Secondary | ICD-10-CM | POA: Diagnosis not present

## 2020-11-20 DIAGNOSIS — E230 Hypopituitarism: Secondary | ICD-10-CM

## 2020-11-20 DIAGNOSIS — G894 Chronic pain syndrome: Secondary | ICD-10-CM | POA: Diagnosis not present

## 2020-11-20 DIAGNOSIS — I5032 Chronic diastolic (congestive) heart failure: Secondary | ICD-10-CM | POA: Diagnosis not present

## 2020-11-20 DIAGNOSIS — E271 Primary adrenocortical insufficiency: Secondary | ICD-10-CM

## 2020-11-20 DIAGNOSIS — G4733 Obstructive sleep apnea (adult) (pediatric): Secondary | ICD-10-CM

## 2020-11-20 DIAGNOSIS — Z96652 Presence of left artificial knee joint: Secondary | ICD-10-CM

## 2020-11-20 DIAGNOSIS — I480 Paroxysmal atrial fibrillation: Secondary | ICD-10-CM

## 2020-11-25 ENCOUNTER — Encounter: Payer: Self-pay | Admitting: Adult Health Nurse Practitioner

## 2020-11-25 ENCOUNTER — Telehealth: Payer: Self-pay | Admitting: Adult Health Nurse Practitioner

## 2020-11-25 ENCOUNTER — Other Ambulatory Visit: Payer: Self-pay

## 2020-11-25 ENCOUNTER — Other Ambulatory Visit: Payer: Medicare Other | Admitting: Adult Health Nurse Practitioner

## 2020-11-25 VITALS — BP 144/72 | HR 83 | Wt 317.0 lb

## 2020-11-25 DIAGNOSIS — Z515 Encounter for palliative care: Secondary | ICD-10-CM

## 2020-11-25 DIAGNOSIS — M5442 Lumbago with sciatica, left side: Secondary | ICD-10-CM | POA: Diagnosis not present

## 2020-11-25 DIAGNOSIS — G20 Parkinson's disease: Secondary | ICD-10-CM

## 2020-11-25 DIAGNOSIS — M159 Polyosteoarthritis, unspecified: Secondary | ICD-10-CM | POA: Diagnosis not present

## 2020-11-25 DIAGNOSIS — E271 Primary adrenocortical insufficiency: Secondary | ICD-10-CM | POA: Diagnosis not present

## 2020-11-25 DIAGNOSIS — G8929 Other chronic pain: Secondary | ICD-10-CM

## 2020-11-25 DIAGNOSIS — M5441 Lumbago with sciatica, right side: Secondary | ICD-10-CM | POA: Diagnosis not present

## 2020-11-25 NOTE — Telephone Encounter (Signed)
This is late entry, spoke with patient yesterday, 11/24/20.  His pain is worsening again despite increasing fentanyl patch to 74mcg/hr Q 3days.  He still had some of his 50 mcg/hr patches left.  Instructed to take off the 75 mcg patch that is due to be changed and to put on 2 of the 50 mcg patches (192mcg/hr).  Set up appointment for 11/25/20 to reevaluate.   Zamire Whitehurst K. Olena Heckle NP

## 2020-11-25 NOTE — Progress Notes (Signed)
  [facility] Consult Note Telephone: [phone]  Fax: [phone]    Date of encounter: 11/25/20 PATIENT NAME: Dennis Alvin Malkiewicz Sr. [address] [place] [address]   [phone] (home)  DOB: 12/15/1945 MRN: [id number] PRIMARY CARE PROVIDER:    Bedsole,  E, MD,  [address] [place] [address] [phone]  REFERRING PROVIDER:   Bedsole,  E, MD [address] [place],  [place] [address] [phone]  RESPONSIBLE PARTY:    Contact Information    Name Relation Home Work Mobile   Irizarry,Cheryl Spouse [phone]  [phone]   Riebel,Damauri Son [phone]         I met face to face with patient and family in home. Palliative Care was asked to follow this patient by consultation request of  Bedsole,  E, MD to address advance care planning and complex medical decision making. This is a follow up visit.  Wife, son, and father in law present during visit today                                   ASSESSMENT AND PLAN / RECOMMENDATIONS:   Advance Care Planning/Goals of Care: Goals include to maximize quality of life and symptom management.   CODE STATUS: DNR  Symptom Management/Plan:  Chronic pain:  Multiple etiologies including low back pain with sciatica and OA of multiple joints.  The 110 mcg/hr Fentanyl patch is giving better pain relief but he does not like how it makes him feel "out of it."  Have discussed with colleagues ways to help with pain control without going up more on opioids.  He also uses flexeril, lidocaine patches, has back stimulator, and gets joint injections in knees.  Will reassess with a phone call at end of the week.  Believe there may be something undiagnosed that may be causing the worsening pain  Addison's Disease: Not sure if some of symptoms may be related to his Addison's.  Have encouraged him to schedule appointment with his endocrinologist as it has been about year since  his last appointment.  He expressed understanding  Parkinson's:  Recently seen by neurology.  His topiramate was discontinued and started on primidone.  Continue follow up and recommendations by neurology  Follow up Palliative Care Visit: Palliative care will continue to follow for complex medical decision making, advance care planning, and clarification of goals. Return 4 weeks or prn.  Encouraged to call with any questions or concerns  I spent 60 minutes providing this consultation. More than 50% of the time in this consultation was spent in counseling and care coordination.   PPS: 40%  HOSPICE ELIGIBILITY/DIAGNOSIS: TBD  Chief Complaint: follow up palliative visit  HISTORY OF PRESENT ILLNESS:  Dennis Alvin Cowie Sr. is a 75 y.o. year old male  with Parkinson's disease, CHF, Addison's disease, CVA, MI, sleep apnea, hypertension, hyperlipidemia, hypothyroidism, gout, history of testicular cancer and prostate cancer. Patient having increased pain in hips going down into his upper leg with more in the right.  He had called yesterday and had instructed to try 2 fentanyl 50 mcg/hr patches (100 mcg/hr).  States that this does give him better pain relief but the higher dose is making him feel "out of it."  He is feeling more nauseated this morning.  Did state that yesterday his right flank was hurting but that is better today. He states that   he is urinating large quantities. States urine is not darker in color.  Denies strange odor to urine, dysuria, hematuria, fever.  Patient is having increased tremors.  Goes from sitting to standing and takes a few steps very slowly, which is mostly due to pain.  Rest of 10 point ROS asked and negative except what is stated in HPI.  History obtained from review of EMR and interview with family and Dennis [name].     PHYSICAL EXAM:  General: NAD, frail appearing Eyes: Sclera anicteric and noninjected with no discharge noted ENMT: moist mucous  membranes Cardiovascular: regular rate and rhythm Pulmonary:lung sounds clear; normal respiratory effort Abdomen: soft, nontender,hypoactivebowel sounds GU: no CVA tenderness Extremities:traceedema to bilateral feet, no joint deformities Skin: no rasheson exposed skin Neurological:tremors of upper extremities noted today; otherwise nonfocal  Thank you for the opportunity to participate in the care of Dennis [name].  The palliative care team will continue to follow. Please call our office at [phone] if we can be of additional assistance.    K , NP , DNP  This chart was dictated using voice recognition software. Despite best efforts to proofread, errors can occur which can change the documentation meaning.   COVID-19 PATIENT SCREENING TOOL Asked and negative response unless otherwise noted:   Have you had symptoms of covid, tested positive or been in contact with someone with symptoms/positive test in the past 5-10 days? negative  

## 2020-11-27 DIAGNOSIS — F0280 Dementia in other diseases classified elsewhere without behavioral disturbance: Secondary | ICD-10-CM | POA: Diagnosis not present

## 2020-11-27 DIAGNOSIS — M1711 Unilateral primary osteoarthritis, right knee: Secondary | ICD-10-CM | POA: Diagnosis not present

## 2020-11-27 DIAGNOSIS — M2391 Unspecified internal derangement of right knee: Secondary | ICD-10-CM | POA: Diagnosis not present

## 2020-11-27 DIAGNOSIS — G20 Parkinson's disease: Secondary | ICD-10-CM | POA: Diagnosis not present

## 2020-11-27 DIAGNOSIS — M47896 Other spondylosis, lumbar region: Secondary | ICD-10-CM | POA: Diagnosis not present

## 2020-11-27 DIAGNOSIS — M109 Gout, unspecified: Secondary | ICD-10-CM | POA: Diagnosis not present

## 2020-11-30 ENCOUNTER — Other Ambulatory Visit: Payer: Medicare Other | Admitting: Adult Health Nurse Practitioner

## 2020-11-30 ENCOUNTER — Encounter: Payer: Self-pay | Admitting: Adult Health Nurse Practitioner

## 2020-11-30 DIAGNOSIS — M5442 Lumbago with sciatica, left side: Secondary | ICD-10-CM | POA: Diagnosis not present

## 2020-11-30 DIAGNOSIS — Z515 Encounter for palliative care: Secondary | ICD-10-CM | POA: Diagnosis not present

## 2020-11-30 DIAGNOSIS — M5441 Lumbago with sciatica, right side: Secondary | ICD-10-CM | POA: Diagnosis not present

## 2020-11-30 DIAGNOSIS — G8929 Other chronic pain: Secondary | ICD-10-CM

## 2020-11-30 DIAGNOSIS — M159 Polyosteoarthritis, unspecified: Secondary | ICD-10-CM

## 2020-11-30 NOTE — Progress Notes (Signed)
    [facility] Consult Note Telephone: [phone]  Fax: [phone]    Date of encounter: 11/30/20 PATIENT NAME: Dennis Nations Capobianco Sr. [place] [address]   [phone] (home)  DOB: 1945/12/07 MRN: [id number] PRIMARY CARE PROVIDER:    Jinny Sanders, MD,  [place] [address] [phone]  REFERRING PROVIDER:   Jinny Sanders, MD [address] [place],   [address] [phone]  RESPONSIBLE PARTY:    Contact Information    Name Relation Home Work Mobile   Travaglini,Cheryl Spouse [phone]  [phone]   Faniel,Krystal Pandora Leiter [phone]         I had phone conversation with patient. Palliative Care was asked to follow this patient by consultation request of  Jinny Sanders, MD to address advance care planning and complex medical decision making. This is a follow up visit.                                   ASSESSMENT AND PLAN / RECOMMENDATIONS:   Advance Care Planning/Goals of Care: Goals include to maximize quality of life and symptom management.   CODE STATUS: DNR  Symptom Management/Plan:  Chronic pain:  Multiple etiologies including low back pain with sciatica and OA of multiple joints.  Patient is getting better pain relief with the Fentanyl 54mcg/hr patch apply 2 patches Q 3 days. States that he only has 2 50 mcg patches left.  States that the increase is no longer making him feel "out of it."  Sent in escript to [facility] for Fentanyl patch 19mcg/hr apply one patch Q3 days.  Encouraged to make sure he sets up appointment with endocrinology   Follow up Palliative Care Visit: Palliative care will continue to follow for complex medical decision making, advance care planning, and clarification of goals. Return 3 weeks or prn.  I spent 25 minutes providing this consultation. More than 50% of the time in this consultation was spent in counseling and care  coordination.   PPS: 40%  HOSPICE ELIGIBILITY/DIAGNOSIS: TBD  Chief Complaint: follow palliative visit  HISTORY OF PRESENT ILLNESS:  Dennis Loughney Musial Sr. is a 75 y.o. year old male  with Parkinson's disease, CHF, Addison's disease, CVA, MI, sleep apnea, hypertension, hyperlipidemia, hypothyroidism, gout, history of testicular cancer and prostate cancer.   History obtained from review of EMR and interview with Dennis [name].   Physical Exam: Deferred   Thank you for the opportunity to participate in the care of Dennis [name].  The palliative care team will continue to follow. Please call our office at [phone] if we can be of additional assistance.   Lydon Vansickle Jenetta Downer, NP , DNP  This chart was dictated using voice recognition software. Despite best efforts to proofread, errors can occur which can change the documentation meaning.   COVID-19 PATIENT SCREENING TOOL Asked and negative response unless otherwise noted:   Have you had symptoms of covid, tested positive or been in contact with someone with symptoms/positive test in the past 5-10 days? negative

## 2020-12-01 ENCOUNTER — Other Ambulatory Visit: Payer: Self-pay

## 2020-12-02 DIAGNOSIS — F0280 Dementia in other diseases classified elsewhere without behavioral disturbance: Secondary | ICD-10-CM | POA: Diagnosis not present

## 2020-12-02 DIAGNOSIS — M2391 Unspecified internal derangement of right knee: Secondary | ICD-10-CM | POA: Diagnosis not present

## 2020-12-02 DIAGNOSIS — G20 Parkinson's disease: Secondary | ICD-10-CM | POA: Diagnosis not present

## 2020-12-02 DIAGNOSIS — M109 Gout, unspecified: Secondary | ICD-10-CM | POA: Diagnosis not present

## 2020-12-02 DIAGNOSIS — M47896 Other spondylosis, lumbar region: Secondary | ICD-10-CM | POA: Diagnosis not present

## 2020-12-02 DIAGNOSIS — M1711 Unilateral primary osteoarthritis, right knee: Secondary | ICD-10-CM | POA: Diagnosis not present

## 2020-12-04 ENCOUNTER — Other Ambulatory Visit: Payer: Medicare Other

## 2020-12-08 ENCOUNTER — Telehealth: Payer: Self-pay | Admitting: Family Medicine

## 2020-12-08 NOTE — Telephone Encounter (Signed)
Tiffany calling regarding pt. States that pt is out of town so he will be missing the pt and home health.

## 2020-12-09 ENCOUNTER — Telehealth: Payer: Self-pay | Admitting: Family Medicine

## 2020-12-09 ENCOUNTER — Telehealth: Payer: Medicare Other | Admitting: Endocrinology

## 2020-12-09 NOTE — Telephone Encounter (Signed)
Dennis [name] called in and wanted to report that he will have missed an appointment by Friday, [place] and something came up and they have to stay until the end of the week.

## 2020-12-09 NOTE — Telephone Encounter (Signed)
Home Health verbal orders-caller/Agency:  Kathaleen Bury / [facility]   Callback number:  [phone] Requesting OT/PT/Skilled nursing/Social Work/Speech: PT  Reason:  Patient missed one PT session  Frequency:

## 2020-12-10 NOTE — Telephone Encounter (Signed)
Noted  

## 2020-12-10 NOTE — Telephone Encounter (Signed)
Given verbal orders as requested for PT

## 2020-12-11 NOTE — Telephone Encounter (Signed)
Patient missed a PT session and they were only calling to inform us of this information no Verbal orders requested.

## 2020-12-14 NOTE — Telephone Encounter (Signed)
To PharmD and [name] for input on potential med interactions.

## 2020-12-15 DIAGNOSIS — G20 Parkinson's disease: Secondary | ICD-10-CM | POA: Diagnosis not present

## 2020-12-15 DIAGNOSIS — M2391 Unspecified internal derangement of right knee: Secondary | ICD-10-CM | POA: Diagnosis not present

## 2020-12-15 DIAGNOSIS — M1711 Unilateral primary osteoarthritis, right knee: Secondary | ICD-10-CM | POA: Diagnosis not present

## 2020-12-15 DIAGNOSIS — F0280 Dementia in other diseases classified elsewhere without behavioral disturbance: Secondary | ICD-10-CM | POA: Diagnosis not present

## 2020-12-15 DIAGNOSIS — M109 Gout, unspecified: Secondary | ICD-10-CM | POA: Diagnosis not present

## 2020-12-15 DIAGNOSIS — M47896 Other spondylosis, lumbar region: Secondary | ICD-10-CM | POA: Diagnosis not present

## 2020-12-17 ENCOUNTER — Other Ambulatory Visit: Payer: Self-pay

## 2020-12-17 ENCOUNTER — Other Ambulatory Visit (INDEPENDENT_AMBULATORY_CARE_PROVIDER_SITE_OTHER): Payer: Medicare Other

## 2020-12-17 DIAGNOSIS — E039 Hypothyroidism, unspecified: Secondary | ICD-10-CM

## 2020-12-17 DIAGNOSIS — E230 Hypopituitarism: Secondary | ICD-10-CM

## 2020-12-17 LAB — CBC
HCT: 45.4 % (ref 39.0–52.0)
Hemoglobin: 15 g/dL (ref 13.0–17.0)
MCHC: 33 g/dL (ref 30.0–36.0)
MCV: 83.9 fl (ref 78.0–100.0)
Platelets: 189 10*3/uL (ref 150.0–400.0)
RBC: 5.41 Mil/uL (ref 4.22–5.81)
RDW: 14.3 % (ref 11.5–15.5)
WBC: 11.6 10*3/uL — ABNORMAL HIGH (ref 4.0–10.5)

## 2020-12-17 LAB — BASIC METABOLIC PANEL
BUN: 20 mg/dL (ref 6–23)
CO2: 32 mEq/L (ref 19–32)
Calcium: 9.8 mg/dL (ref 8.4–10.5)
Chloride: 97 mEq/L (ref 96–112)
Creatinine, Ser: 1.03 mg/dL (ref 0.40–1.50)
GFR: 71.22 mL/min (ref >60.00)
Glucose, Bld: 107 mg/dL — ABNORMAL HIGH (ref 70–99)
Potassium: 3.7 mEq/L (ref 3.5–5.1)
Sodium: 136 mEq/L (ref 135–145)

## 2020-12-17 LAB — T4, FREE: Free T4: 1.68 ng/dL — ABNORMAL HIGH (ref 0.60–1.60)

## 2020-12-17 LAB — TSH: TSH: 0.03 u[IU]/mL — ABNORMAL LOW (ref 0.35–4.50)

## 2020-12-17 LAB — TESTOSTERONE: Testosterone: 372.51 ng/dL (ref 300.00–890.00)

## 2020-12-18 ENCOUNTER — Telehealth: Payer: Self-pay | Admitting: Pharmacist

## 2020-12-18 MED ORDER — ENOXAPARIN SODIUM 150 MG/ML IJ SOSY: 150.0000 mg | PREFILLED_SYRINGE | Freq: Two times a day (BID) | INTRAMUSCULAR | 1 refills | Status: DC

## 2020-12-18 MED ORDER — WARFARIN SODIUM 5 MG PO TABS: 5.0000 mg | ORAL_TABLET | Freq: Every day | ORAL | 0 refills | Status: DC

## 2020-12-18 NOTE — Telephone Encounter (Signed)
I spoke with patient - he stopped taking Eliqiuis because he said he was told he couldn't be on both. I did explain that it is safer for him to be on both together than no blood thinner at all. But that we would be switching him to warfarin tonight. Made him aware that more frequent monitoring would be needed. He has had several CVA/TIA therefore I feel a bridge is indicated. Patient has done injections before and is comfortable with them. Reviewed that he will inject into his abdomen every 12 hours. Stay 1-2 in away from belly button, rotate sites and do not flick bubble out.  Start warfarin and lovenox tonight. Rx sent to pharmacy. Will check INR in coumadin clinic on Tuesday 6/14 @2 :30. Will start warfarin 5mg  daily. He might need higher dose due to primidone interaction, will adjust Tuesday if he is low.

## 2020-12-21 ENCOUNTER — Other Ambulatory Visit: Payer: Self-pay

## 2020-12-21 ENCOUNTER — Other Ambulatory Visit: Payer: Medicare Other | Admitting: Adult Health Nurse Practitioner

## 2020-12-21 ENCOUNTER — Encounter: Payer: Self-pay | Admitting: Adult Health Nurse Practitioner

## 2020-12-21 VITALS — BP 138/80 | HR 99 | Temp 97.0°F

## 2020-12-21 DIAGNOSIS — G8929 Other chronic pain: Secondary | ICD-10-CM

## 2020-12-21 DIAGNOSIS — Z515 Encounter for palliative care: Secondary | ICD-10-CM | POA: Diagnosis not present

## 2020-12-21 DIAGNOSIS — M5442 Lumbago with sciatica, left side: Secondary | ICD-10-CM | POA: Diagnosis not present

## 2020-12-21 DIAGNOSIS — Z7901 Long term (current) use of anticoagulants: Secondary | ICD-10-CM

## 2020-12-21 DIAGNOSIS — M159 Polyosteoarthritis, unspecified: Secondary | ICD-10-CM

## 2020-12-21 DIAGNOSIS — R7989 Other specified abnormal findings of blood chemistry: Secondary | ICD-10-CM | POA: Diagnosis not present

## 2020-12-21 DIAGNOSIS — M5441 Lumbago with sciatica, right side: Secondary | ICD-10-CM | POA: Diagnosis not present

## 2020-12-21 NOTE — Progress Notes (Signed)
[facility] Consult Note Telephone: [phone]  Fax: [phone]    Date of encounter: 12/21/20 PATIENT NAME: Dennis Nations Emert Sr. [name]   [phone] (home)  DOB: 1946-04-05 MRN: [id number] PRIMARY CARE PROVIDER:    Jinny Sanders, MD,  [place] [address] [phone]  REFERRING PROVIDER:   Jinny Sanders, MD [address] [place],  [place] [address] [phone]  RESPONSIBLE PARTY:    Contact Information     Name Relation Home Work Mobile   Dennis [name] Spouse [phone]  [phone]   Dennis [name] Pandora Leiter [phone]          I met face to face with patient and family in home. Palliative Care was asked to follow this patient by consultation request of  Dennis Sanders, MD to address advance care planning and complex medical decision making. This is a follow up visit.  Wife, son, father in law present during visit today                                   ASSESSMENT AND PLAN / RECOMMENDATIONS:   Advance Care Planning/Goals of Care: Goals include to maximize quality of life and symptom management.  CODE STATUS: DNR  Symptom Management/Plan:  Chronic pain:  Multiple etiologies including low back pain with sciatica and OA of multiple joints.  Still getting relief with fentanyl 141mg/hr patch every 3 days  Chronic anticoagulation: patient has recently been changed from eliquis to warfarin as his primidone was contraindicated with the eliquis.  He has appointment with cardiology tomorrow for monitoring.  Has been getting lovenox shots for bridging  Abnormal TSH: Per recent lab work TSH 0.03 and free T4 of 1.68.  Has appointment with endocrinology tomorrow.  Continue follow up and recommendations by endocrinology   Follow up Palliative Care Visit: Palliative care will continue to follow for complex medical decision making, advance care planning, and  clarification of goals. Return 2 weeks or prn.  Encouraged to call with any questions or concerns  I spent 45 minutes providing this consultation. More than 50% of the time in this consultation was spent in counseling and care coordination.  PPS: 40%  HOSPICE ELIGIBILITY/DIAGNOSIS: TBD  Chief Complaint: follow up palliative visit  HISTORY OF PRESENT ILLNESS:  Dennis RadfordCox Sr. is a 75y.o. year old male  with Parkinson's disease, CHF, Addison's disease, CVA, MI, sleep apnea, hypertension, hyperlipidemia, hypothyroidism, gout, history of testicular cancer and prostate cancer . Patient has been having good pain relief with current regimen of Fentanyl 1080m/hr patch Q 3days and Xtampza 36 mg BID.  Has oxycodone 10 mg every4 hours PRN for breakthrough pain and has been rarely taking this.  Also uses flexeril 5 mg every 8 hours PRN.  Today he is having nausea  with increased shakiness and wife and son state that he gets this about twice a week and that it lasts about 40-60 minutes and goes away.  Wife believes it may be due to sugar spikes and son has not noticed any patterns.  Patient does not have a history of diabetes but does have a family history of diabetes with his mother, sister, and niece all having diabetes.  By the end of our visit he is starting to feel better and is eating an apple.  Patient has just been transitioned from eliquis  to warfarin due to primidone reducing effectiveness of eliquis.  Patient's appetite is slowly improving.  States not having a BM in 2 days and encouraged to take Miralax.   History obtained from review of EMR and interview with family and Dennis [name].  I reviewed available labs, medications and related documents from the EMR.  Records reviewed and summarized above.    PHYSICAL EXAM:    General: NAD, frail appearing Eyes:  Sclera anicteric and noninjected with no discharge noted ENMT: moist mucous membranes Cardiovascular: regular rate and rhythm Pulmonary:  lung sounds clear; normal respiratory effort Abdomen: soft, nontender, hypoactive bowel sounds Extremities: trace edema to bilateral feet, no joint deformities Skin: no rashes on exposed skin; does have bruising noted to arms duet to starting warfarin Neurological: tremors of upper extremities noted today; otherwise nonfocal   Thank you for the opportunity to participate in the care of Dennis [name].  The palliative care team will continue to follow. Please call our office at [phone] if we can be of additional assistance.   Dennis Gamel Jenetta Downer, NP , DNP  This chart was dictated using voice recognition software.  Despite best efforts to proofread,  errors can occur which can change the documentation meaning.   COVID-19 PATIENT SCREENING TOOL Asked and negative response unless otherwise noted:   Have you had symptoms of covid, tested positive or been in contact with someone with symptoms/positive test in the past 5-10 days?
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
## 2020-12-22 ENCOUNTER — Ambulatory Visit (INDEPENDENT_AMBULATORY_CARE_PROVIDER_SITE_OTHER): Payer: Medicare Other

## 2020-12-22 ENCOUNTER — Other Ambulatory Visit: Payer: Self-pay

## 2020-12-22 ENCOUNTER — Encounter: Payer: Self-pay | Admitting: Endocrinology

## 2020-12-22 ENCOUNTER — Telehealth (INDEPENDENT_AMBULATORY_CARE_PROVIDER_SITE_OTHER): Payer: Medicare Other | Admitting: Endocrinology

## 2020-12-22 VITALS — BP 130/80 | Ht 77.0 in | Wt 317.0 lb

## 2020-12-22 DIAGNOSIS — E039 Hypothyroidism, unspecified: Secondary | ICD-10-CM

## 2020-12-22 DIAGNOSIS — E230 Hypopituitarism: Secondary | ICD-10-CM | POA: Diagnosis not present

## 2020-12-22 DIAGNOSIS — I480 Paroxysmal atrial fibrillation: Secondary | ICD-10-CM | POA: Diagnosis not present

## 2020-12-22 DIAGNOSIS — I2510 Atherosclerotic heart disease of native coronary artery without angina pectoris: Secondary | ICD-10-CM

## 2020-12-22 DIAGNOSIS — Z7901 Long term (current) use of anticoagulants: Secondary | ICD-10-CM

## 2020-12-22 LAB — POCT INR: INR: 1.3 — AB (ref 2.0–3.0)

## 2020-12-22 NOTE — Patient Instructions (Addendum)
Description   Take 2 tablets today and tomorrow, then start taking 1.5 tablets daily.  Continue on Lovenox injections twice daily.  Recheck on Friday.  Pt is on Primidone, Warfarin interaction.  [facility] [phone], Main number [phone]     A full discussion of the nature of anticoagulants has been carried out.  A benefit risk analysis has been presented to the patient, so that they understand the justification for choosing anticoagulation at this time. The need for frequent and regular monitoring, precise dosage adjustment and compliance is stressed.  Side effects of potential bleeding are discussed.  The patient should avoid any OTC items containing aspirin or ibuprofen, and should avoid great swings in general diet.  Avoid alcohol consumption.  Call if any signs of abnormal bleeding.

## 2020-12-25 ENCOUNTER — Other Ambulatory Visit: Payer: Self-pay

## 2020-12-25 ENCOUNTER — Ambulatory Visit (INDEPENDENT_AMBULATORY_CARE_PROVIDER_SITE_OTHER): Payer: Medicare Other | Admitting: Pharmacist

## 2020-12-25 DIAGNOSIS — Z7901 Long term (current) use of anticoagulants: Secondary | ICD-10-CM

## 2020-12-25 DIAGNOSIS — I480 Paroxysmal atrial fibrillation: Secondary | ICD-10-CM | POA: Diagnosis not present

## 2020-12-25 LAB — POCT INR: INR: 1.7 — AB (ref 2.0–3.0)

## 2020-12-25 NOTE — Patient Instructions (Signed)
Take 2.5 tablets today and 2 tablets tomorrow, then start taking 1.5 tablets daily.  Continue on Lovenox injections twice daily.  Recheck on Monday. [facility] Clinic [phone], Main number [phone]

## 2020-12-28 ENCOUNTER — Other Ambulatory Visit: Payer: Self-pay

## 2020-12-28 ENCOUNTER — Ambulatory Visit (INDEPENDENT_AMBULATORY_CARE_PROVIDER_SITE_OTHER): Payer: Medicare Other

## 2020-12-28 DIAGNOSIS — Z7901 Long term (current) use of anticoagulants: Secondary | ICD-10-CM

## 2020-12-28 DIAGNOSIS — I480 Paroxysmal atrial fibrillation: Secondary | ICD-10-CM | POA: Diagnosis not present

## 2020-12-28 LAB — POCT INR: INR: 2.8 (ref 2.0–3.0)

## 2020-12-28 NOTE — Patient Instructions (Signed)
Description   Stop taking Lovenox injections.  Start taking 1.5 tablets daily except 2 tablets on Mondays and Fridays.  Recheck in 1 week.  Pt is on Primidone, Warfarin interaction.  [facility] [phone], Main number [phone]

## 2020-12-29 ENCOUNTER — Telehealth: Payer: Self-pay | Admitting: Adult Health Nurse Practitioner

## 2020-12-29 NOTE — Telephone Encounter (Signed)
Returned patient's VM.  He received notification from his pharmacy for the refills on Fentanyl patch 100 mcg/hr Q 3 days, Xtampz 36 mg BID, and flexeril 5 mg Q 8 hrs PRN for muscle spasms. Also needing a refill on oxycodone/apap/ 10/325 mg Q 4hrs PRN for pain.  All scripts sent to [facility] on [address] in [place].  [place] substance abuse data base checked. Nature Kueker K. Olena Heckle NP

## 2021-01-01 DIAGNOSIS — M7061 Trochanteric bursitis, right hip: Secondary | ICD-10-CM | POA: Diagnosis not present

## 2021-01-04 ENCOUNTER — Ambulatory Visit (INDEPENDENT_AMBULATORY_CARE_PROVIDER_SITE_OTHER): Payer: Medicare Other | Admitting: *Deleted

## 2021-01-04 ENCOUNTER — Other Ambulatory Visit: Payer: Self-pay | Admitting: Pharmacist

## 2021-01-04 ENCOUNTER — Other Ambulatory Visit: Payer: Self-pay

## 2021-01-04 DIAGNOSIS — Z7901 Long term (current) use of anticoagulants: Secondary | ICD-10-CM

## 2021-01-04 DIAGNOSIS — I480 Paroxysmal atrial fibrillation: Secondary | ICD-10-CM | POA: Diagnosis not present

## 2021-01-04 LAB — POCT INR: INR: 1.7 — AB (ref 2.0–3.0)

## 2021-01-04 MED ORDER — WARFARIN SODIUM 5 MG PO TABS: ORAL_TABLET | ORAL | 1 refills | Status: DC

## 2021-01-04 NOTE — Telephone Encounter (Signed)
Rx sent in earlier today

## 2021-01-04 NOTE — Patient Instructions (Signed)
Description   Today take 2.5 tablets then start taking 1.5 tablets daily except 2 tablets on Mondays, Wednesdays, and Fridays.  Recheck in 1 week.  Pt is on Primidone, Warfarin interaction.  [facility] [phone], Main number [phone]

## 2021-01-07 ENCOUNTER — Other Ambulatory Visit: Payer: Medicare Other | Admitting: Adult Health Nurse Practitioner

## 2021-01-07 ENCOUNTER — Other Ambulatory Visit: Payer: Self-pay

## 2021-01-07 ENCOUNTER — Encounter: Payer: Self-pay | Admitting: Adult Health Nurse Practitioner

## 2021-01-07 VITALS — BP 160/90 | HR 96

## 2021-01-07 DIAGNOSIS — M5442 Lumbago with sciatica, left side: Secondary | ICD-10-CM | POA: Diagnosis not present

## 2021-01-07 DIAGNOSIS — R3915 Urgency of urination: Secondary | ICD-10-CM | POA: Diagnosis not present

## 2021-01-07 DIAGNOSIS — F320 Major depressive disorder, single episode, mild: Secondary | ICD-10-CM

## 2021-01-07 DIAGNOSIS — G8929 Other chronic pain: Secondary | ICD-10-CM

## 2021-01-07 DIAGNOSIS — M159 Polyosteoarthritis, unspecified: Secondary | ICD-10-CM

## 2021-01-07 DIAGNOSIS — M5441 Lumbago with sciatica, right side: Secondary | ICD-10-CM | POA: Diagnosis not present

## 2021-01-07 DIAGNOSIS — Z515 Encounter for palliative care: Secondary | ICD-10-CM | POA: Diagnosis not present

## 2021-01-07 DIAGNOSIS — M519 Unspecified thoracic, thoracolumbar and lumbosacral intervertebral disc disorder: Secondary | ICD-10-CM | POA: Diagnosis not present

## 2021-01-07 NOTE — Progress Notes (Signed)
[facility] Consult Note Telephone: [phone]  Fax: [phone]    Date of encounter: 01/07/21 PATIENT NAME: Dennis Nations Ardelean Sr. [place] [address]   [phone] (home)  DOB: [birth year] MRN: [id number] PRIMARY CARE PROVIDER:    Jinny Sanders, MD,  [place] [address] [phone]  REFERRING PROVIDER:   Jinny Sanders, MD [address] [place],  [place] [address] [phone]  RESPONSIBLE PARTY:    Contact Information     Name Relation Home Work Mobile   Durrett,Cheryl Spouse [phone]  [phone]   Chaudhari,Kyvon Pandora Leiter [phone]          I met face to face with patient and family in home. Palliative Care was asked to follow this patient by consultation request of  Jinny Sanders, MD to address advance care planning and complex medical decision making. This is a follow up visit.  Wife, son, and father-in-law present during visit today                                   ASSESSMENT AND PLAN / RECOMMENDATIONS:   Advance Care Planning/Goals of Care: Goals include to maximize quality of life and symptom management.  CODE STATUS: DNR  Symptom Management/Plan:  Chronic pain: Patient did not pick of last refill of Xtampza ER due to being in donut hole and was going to be over $300 a month.  States that he has been doing well without it for almost a month now and has been getting relief with the as needed Percocet and 100 mcg/h fentanyl patch.  States that he would like to try to wean off of the fentanyl patch.  Have recommended using 75 mcg patch that he had left over before just stopping it altogether.  Did discuss that if his pain started to worsen with going down to the 75 mcg patch that would be best to stay on the 100 mcg/h fentanyl patch.  Depression: Family has noticed patient having increased depression with current medical situation and just feeling like no one wants to  address his health.  We will put in social work referral to help arrange proper mental health needs.  Urinary urgency: Patient having increased urgency and frequency with urination.  He does have history of prostate cancer with recurrence.  Have encouraged reaching out to urology to set up an appointment.   Follow up Palliative Care Visit: Palliative care will continue to follow for complex medical decision making, advance care planning, and clarification of goals. Return 2 weeks or prn.  I spent 60 minutes providing this consultation. More than 50% of the time in this consultation was spent in counseling and care coordination.   PPS: 40%  HOSPICE ELIGIBILITY/DIAGNOSIS: TBD  Chief Complaint: Follow-up palliative visit  HISTORY OF PRESENT ILLNESS:  Dennis Gasparro Bernales Sr. is a 75 y.o. year old male  with Parkinson's disease, CHF, Addison's disease, CVA, MI, sleep apnea, hypertension, hyperlipidemia, hypothyroidism, gout, history of testicular cancer and prostate cancer.  Patient has not picked up his last prescription of Xtampza due to being in the donut hole and it was can be over $300 a month for it.  Does state that he has not been using it for almost a month now and has been getting adequate pain relief with the as needed Percocet.  States that he has been  trying to use less of the Percocet as well.  States that he would like to try to get off of the fentanyl patch as well.  Have recommended tapering and not just stopping cold [place].  Patient has been having increased urgency and frequency with urination.  States that his urine is clear like pure water.  Denies dysuria, hematuria, fever.  Recent lab work did not show abnormal kidney function but family is concerned that with all his medications this may be having an effect on his kidneys.  Wife does state that he in the past has seen a urologist for his prostate cancer.  Patient at times does have functional incontinence in which she does not make it  to the bathroom in time.  Family does state that they have noticed that he has been depressed lately but has been going through situations which she feels as if the healthcare system is not helping him.  Today patient does appear depressed.  Rest of 10 point ROS asked and negative except what is stated in HPI.  History obtained from review of EMR and interview with family and Mr. Berthold.    PHYSICAL EXAM:    General: NAD, frail appearing Eyes:  Sclera anicteric and noninjected with no discharge noted ENMT: moist mucous membranes Cardiovascular: regular rate and rhythm Pulmonary: lung sounds clear; normal respiratory effort Abdomen: soft, nontender, hypoactive bowel sounds Extremities: no edema to bilateral feet, no joint deformities Skin: no rashes on exposed skin; does have bruising noted to arms due to starting warfarin Neurological: tremors of upper extremities noted today; otherwise nonfocal   Thank you for the opportunity to participate in the care of Dennis [name].  The palliative care team will continue to follow. Please call our office at [phone] if we can be of additional assistance.   Imani Fiebelkorn Jenetta Downer, NP , DNP  This chart was dictated using voice recognition software.  Despite best efforts to proofread,  errors can occur which can change the documentation meaning.   COVID-19 PATIENT SCREENING TOOL Asked and negative response unless otherwise noted:   Have you had symptoms of covid, tested positive or been in contact with someone with symptoms/positive test in the past 5-10 days?

## 2021-01-13 ENCOUNTER — Other Ambulatory Visit: Payer: Self-pay

## 2021-01-13 ENCOUNTER — Ambulatory Visit (INDEPENDENT_AMBULATORY_CARE_PROVIDER_SITE_OTHER): Payer: Medicare Other | Admitting: Pharmacist

## 2021-01-13 DIAGNOSIS — I480 Paroxysmal atrial fibrillation: Secondary | ICD-10-CM

## 2021-01-13 DIAGNOSIS — Z7901 Long term (current) use of anticoagulants: Secondary | ICD-10-CM | POA: Diagnosis not present

## 2021-01-13 LAB — POCT INR: INR: 2.2 (ref 2.0–3.0)

## 2021-01-13 NOTE — Patient Instructions (Addendum)
Description   Continue taking 1.5 tablets daily except 2 tablets on Sundays, Mondays, Wednesdays, and Fridays.  Recheck in 2 weeks.  Pt is on Primidone, Warfarin interaction.  [facility] [phone], Main number [phone]

## 2021-01-14 DIAGNOSIS — R350 Frequency of micturition: Secondary | ICD-10-CM | POA: Diagnosis not present

## 2021-01-14 DIAGNOSIS — N401 Enlarged prostate with lower urinary tract symptoms: Secondary | ICD-10-CM | POA: Diagnosis not present

## 2021-01-14 DIAGNOSIS — Z8546 Personal history of malignant neoplasm of prostate: Secondary | ICD-10-CM | POA: Diagnosis not present

## 2021-01-14 DIAGNOSIS — N3946 Mixed incontinence: Secondary | ICD-10-CM | POA: Diagnosis not present

## 2021-01-18 ENCOUNTER — Telehealth: Payer: Self-pay

## 2021-01-18 DIAGNOSIS — M7061 Trochanteric bursitis, right hip: Secondary | ICD-10-CM | POA: Diagnosis not present

## 2021-01-18 NOTE — Telephone Encounter (Signed)
01/18/21 @1115AM : Palliative care SW outreached patient per NP - A. Olena Heckle request, to address depression and possible mental health referral.    Call unsuccessful. SW left VM. Awaiting return call.

## 2021-01-19 ENCOUNTER — Other Ambulatory Visit: Payer: Self-pay

## 2021-01-19 ENCOUNTER — Other Ambulatory Visit: Payer: Medicare Other | Admitting: Student

## 2021-01-19 DIAGNOSIS — F339 Major depressive disorder, recurrent, unspecified: Secondary | ICD-10-CM | POA: Diagnosis not present

## 2021-01-19 DIAGNOSIS — G8929 Other chronic pain: Secondary | ICD-10-CM

## 2021-01-19 DIAGNOSIS — Z515 Encounter for palliative care: Secondary | ICD-10-CM

## 2021-01-19 DIAGNOSIS — M5441 Lumbago with sciatica, right side: Secondary | ICD-10-CM | POA: Diagnosis not present

## 2021-01-19 DIAGNOSIS — M5442 Lumbago with sciatica, left side: Secondary | ICD-10-CM | POA: Diagnosis not present

## 2021-01-19 DIAGNOSIS — R350 Frequency of micturition: Secondary | ICD-10-CM

## 2021-01-19 NOTE — Progress Notes (Signed)
[facility] Consult Note Telephone: [phone]  Fax: [phone]    Date of encounter: 01/19/21 PATIENT NAME: Dennis Nations Scheeler Sr. [place] [address]   [phone] (home)  DOB: [birth year] MRN: [id number] PRIMARY CARE PROVIDER:    Jinny Sanders, MD,  [place] [address] [phone]  REFERRING PROVIDER:   Jinny Sanders, MD [address] [place],  [place] [address] [phone]  RESPONSIBLE PARTY:    Contact Information     Name Relation Home Work Mobile   Forton,Cheryl Spouse [phone]  [phone]   Orourke,Gean Pandora Leiter [phone]          I met face to face with patient and family in the home Palliative Care was asked to follow this patient by consultation request of  Jinny Sanders, MD to address advance care planning and complex medical decision making. This is a follow up visit.                                   ASSESSMENT AND PLAN / RECOMMENDATIONS:   Advance Care Planning/Goals of Care: Goals include to maximize quality of life and symptom management. Our advance care planning conversation included a discussion about:    The value and importance of advance care planning  Experiences with loved ones who have been seriously ill or have died  Exploration of personal, cultural or spiritual beliefs that might influence medical decisions  Exploration of goals of care in the event of a sudden injury or illness  CODE STATUS: DNR  Symptom Management/Plan:  Pain-patient received steroid injection yesterday for right hip bursitis. He is to follow up in 2 weeks with ortho. Discussed continuing Fentanyl 100 mcg every 72 hours. Patient has oxycodone-acetaminophen 10/325 every 4 hours PRN, although he is taking usually once a day. We discussed taking it at least twice a day. If pain continues to worsen, we can look at increasing dose of Fentanyl patch. Refills to be sent for  Fentanyl and Flexeril PRN. Resume PT as tolerated.   Depression-patient currently receiving duloxetine 60 mg daily. Will monitor for better pain management prior to increasing dose as family reports increased agitation when his pain is not well managed, he is hurting.   Urinary frequency-patient has started samples of Myrbetriq. Education provided on medication. Urine culture pending per urology. Follow up with urology as scheduled.   Follow up Palliative Care Visit: Palliative care will continue to follow for complex medical decision making, advance care planning, and clarification of goals. Return in 4 weeks or prn.  I spent 60 minutes providing this consultation. More than 50% of the time in this consultation was spent in counseling and care coordination.   PPS: 40%  HOSPICE ELIGIBILITY/DIAGNOSIS: TBD  Chief Complaint: Palliative Medicine follow up visit.   HISTORY OF PRESENT ILLNESS:  Dennis Hosley Bhavsar Sr. is a 75 y.o. year old male  with chronic back pain, bilateral sciatica, OA, Parkinson's, Addison's disease, CAD, paroxysmal atrial fibrillation, diastolic HF, hypertension, gout, hx of TIA, CVA.  Patient resides at home with wife, son. He received steroid injection to right hip yesterday due to bursitis. He is receiving PT with [facility], but currently on hold due to recent injection. Patient had been c/o urinary frequency. He was seen by Urologist, Dr. Gilford Rile on Monday and received samples of Myrbetriq daily; he endorses some  improvement so far. Urine checked; culture is pending. He denies any other urinary complaints, no fever or chills. History of prostate and testicular cancer. He endorses a good appetite; no weight loss reported. Last INR 2.2. Patient has a stimulator for his back pain, sciatica. He states he is using 100 mcg patch; takes percocet usually once a day. Wife and son feel he waits too long to take and pain becomes severe. Family reports increased agitation when he is  hurting. Wife expresses some depression. Patient states his mood is stable. Last gout flare up 1 year ago. Uses cane for ambulation. No recent falls or injury.    History obtained from review of EMR, discussion with primary team, and interview with family, facility staff/caregiver and/or Dennis [name].  I reviewed available labs, medications, imaging, studies and related documents from the EMR.  Records reviewed and summarized above.   ROS  General: NAD EYES: denies vision changes ENMT: denies dysphagia Cardiovascular: denies chest pain, denies DOE Pulmonary: denies cough, denies increased SOB Abdomen: endorses good appetite, denies constipation, endorses continence of bowel GU: urinary frequency MSK: weakness Skin: denies rashes or wounds Neurological: denies insomnia Psych: Endorses stable mood Heme/lymph/immuno: denies bruises, abnormal bleeding  Physical Exam: Pulse-68, resp 16, sats 97% on room air Constitutional: NAD, grimaces in pain at times during visit General: frail appearing  EYES: anicteric sclera, lids intact, no discharge  ENMT: intact hearing, oral mucous membranes moist, dentition intact CV: S1S2, RRR, pedal LE edema Pulmonary: LCTA, no increased work of breathing, no cough, room air Abdomen: normo-active BS + 4 quadrants, soft and non tender GU: deferred MSK: moves all extremities, ambulatory with cane Skin: warm and dry, no rashes or wounds on visible skin, mild erythema to BLE Neuro: generalized weakness, tremor, A & O x 3 Psych: non-anxious affect, pleasant Hem/lymph/immuno: no widespread bruising   Thank you for the opportunity to participate in the care of Dennis [name].  The palliative care team will continue to follow. Please call our office at [phone] if we can be of additional assistance.   Ezekiel Slocumb, NP   COVID-19 PATIENT SCREENING TOOL Asked and negative response unless otherwise noted:   Have you had symptoms of covid, tested positive or been  in contact with someone with symptoms/positive test in the past 5-10 days? No

## 2021-01-20 ENCOUNTER — Telehealth: Payer: Self-pay

## 2021-01-20 NOTE — Telephone Encounter (Signed)
01/20/21 @11AM : Palliative care SW outreached patient, to address depression and possible mental health referral.  Patient shared that he has his good days and bad days, as normal, and shared this with NP originally, but does not feel he wants to explore counseling services at this time. SW validated patients feeling and stated understanding.   Patient is aware that he can outreach SW in the future should he choose to pursue counseling services.

## 2021-01-27 ENCOUNTER — Ambulatory Visit (INDEPENDENT_AMBULATORY_CARE_PROVIDER_SITE_OTHER): Payer: Medicare Other

## 2021-01-27 ENCOUNTER — Other Ambulatory Visit: Payer: Self-pay

## 2021-01-27 DIAGNOSIS — Z5181 Encounter for therapeutic drug level monitoring: Secondary | ICD-10-CM | POA: Diagnosis not present

## 2021-01-27 DIAGNOSIS — I480 Paroxysmal atrial fibrillation: Secondary | ICD-10-CM | POA: Diagnosis not present

## 2021-01-27 DIAGNOSIS — Z7901 Long term (current) use of anticoagulants: Secondary | ICD-10-CM

## 2021-01-27 LAB — POCT INR: INR: 1.9 — AB (ref 2.0–3.0)

## 2021-01-27 NOTE — Patient Instructions (Signed)
-   take 3 tablets warfarin tonight, then - START NEW DOSAGE of 2 tablets every day EXCEPT 3 days on [place] in 2 weeks.   Pt is on Primidone, Warfarin interaction.   [facility] [phone], Main number [phone]

## 2021-01-28 DIAGNOSIS — I480 Paroxysmal atrial fibrillation: Secondary | ICD-10-CM

## 2021-01-29 MED ORDER — APIXABAN 5 MG PO TABS: 5.0000 mg | ORAL_TABLET | Freq: Two times a day (BID) | ORAL | 1 refills | Status: AC

## 2021-02-04 ENCOUNTER — Telehealth: Payer: Self-pay | Admitting: Student

## 2021-02-04 NOTE — Telephone Encounter (Signed)
Received call from patient stating he was in severe pain and is requesting a prescription for ibuprofen '800mg'$  as he states this is the only thing that has been relieving his pain. He is having severe hip pain as well as back pain. He has used a previous prescription he had on hand. NP provided education on ibuprofen, side effects, such as increased risk for bleeding, GI bleed, etc, and his cardiac status. Patient and wife states cardiology is aware of him taking ibuprofen as he was recently switched back over to Eliquis from warfarin. Will send in script for Ibuprofen PRN. Patient to discuss risk vs. Benefits of continuing Eliquis with Cardiologist tomorrow.

## 2021-02-05 ENCOUNTER — Other Ambulatory Visit: Payer: Self-pay

## 2021-02-05 ENCOUNTER — Encounter: Payer: Self-pay | Admitting: Cardiovascular Disease

## 2021-02-05 ENCOUNTER — Ambulatory Visit (INDEPENDENT_AMBULATORY_CARE_PROVIDER_SITE_OTHER): Payer: Medicare Other | Admitting: Cardiovascular Disease

## 2021-02-05 VITALS — BP 162/86 | HR 87 | Ht 77.0 in | Wt 344.0 lb

## 2021-02-05 DIAGNOSIS — I480 Paroxysmal atrial fibrillation: Secondary | ICD-10-CM

## 2021-02-05 DIAGNOSIS — I2510 Atherosclerotic heart disease of native coronary artery without angina pectoris: Secondary | ICD-10-CM | POA: Diagnosis not present
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
## 2021-02-05 NOTE — Patient Instructions (Signed)
Medication Instructions:  Your physician recommends that you continue on your current medications as directed. Please refer to the Current Medication list given to you today.  *If you need a refill on your cardiac medications before your next appointment, please call your pharmacy*   Lab Work: NONE If you have labs (blood work) drawn today and your tests are completely normal, you will receive your results only by: [place] (if you have MyChart) OR A paper copy in the mail If you have any lab test that is abnormal or we need to change your treatment, we will call you to review the results.   Testing/Procedures: NONE   Follow-Up: At [facility], you and your health needs are our priority.  As part of our continuing mission to provide you with exceptional heart care, we have created designated Provider Care Teams.  These Care Teams include your primary Cardiologist (physician) and Advanced Practice Providers (APPs -  Physician Assistants and Nurse Practitioners) who all work together to provide you with the care you need, when you need it.  We recommend signing up for the patient portal called "MyChart".  Sign up information is provided on this After Visit Summary.  MyChart is used to connect with patients for Virtual Visits (Telemedicine).  Patients are able to view lab/test results, encounter notes, upcoming appointments, etc.  Non-urgent messages can be sent to your provider as well.   To learn more about what you can do with MyChart, go to [email or web address].    Your next appointment:   1 year(s)  The format for your next appointment:   In Person  Provider:   You may see Lauree Chandler, MD or one of the following Advanced Practice Providers on your designated Care Team:   Melina Copa, PA-C Ermalinda Barrios, PA-C

## 2021-02-09 ENCOUNTER — Other Ambulatory Visit: Payer: Self-pay | Admitting: Endocrinology

## 2021-02-12 ENCOUNTER — Other Ambulatory Visit: Payer: Self-pay

## 2021-02-12 ENCOUNTER — Other Ambulatory Visit: Payer: Medicare Other | Admitting: Student

## 2021-02-12 DIAGNOSIS — M5442 Lumbago with sciatica, left side: Secondary | ICD-10-CM | POA: Diagnosis not present

## 2021-02-12 DIAGNOSIS — F339 Major depressive disorder, recurrent, unspecified: Secondary | ICD-10-CM

## 2021-02-12 DIAGNOSIS — G8929 Other chronic pain: Secondary | ICD-10-CM | POA: Diagnosis not present

## 2021-02-12 DIAGNOSIS — Z515 Encounter for palliative care: Secondary | ICD-10-CM

## 2021-02-12 DIAGNOSIS — M5441 Lumbago with sciatica, right side: Secondary | ICD-10-CM | POA: Diagnosis not present

## 2021-02-12 NOTE — Progress Notes (Signed)
[place] Consult Note Telephone: [phone]  Fax: [phone]    Date of encounter: 02/12/21 PATIENT NAME: Dennis [name]   [phone] (home)  DOB: 1945-07-20 MRN: [id number] PRIMARY CARE PROVIDER:    Jinny Sanders, MD,  [place] [address] [phone]  REFERRING PROVIDER:   Jinny Sanders, MD [address] [place],  [place] [address] [phone]  RESPONSIBLE PARTY:    Contact Information     Name Relation Home Work Mobile   Knodel,Cheryl Spouse [phone]  [phone]   Frisby,Balraj Pandora Leiter [phone]          Due to the COVID-19 crisis, this home visit was done via telephone due to the patient's inability to connect via an audiovisual connection or their refusal to have an in-person visit. This  connection method was agreed to by the patient/family.                                       ASSESSMENT AND PLAN / RECOMMENDATIONS:   Advance Care Planning/Goals of Care: Goals include to maximize quality of life and symptom management. Discussed with hospice physician; patient does not meet criteria for hospice at this time.   CODE STATUS: DNR  Symptom Management/Plan:  Pain-Recommend starting Dilaudid 4 mg PO every 6 hours PRN. Stop oxycodone-acetaminophen. We discussed holding fentanyl patch for now; will add back in if fentanyl patch if pain is not managed with current regimen. Education provided on adverse effects. Continue ibuprofen and flexeril PRN. Will monitor for effectiveness. Follow up with Ortho as scheduled.  Depression-patient currently receiving duloxetine 60 mg daily. Will monitor for better pain management prior to increasing dose as family reports increased agitation when his pain is not well managed, he is hurting.  Follow up Palliative Care Visit: Palliative care will continue to follow for complex medical decision making,  advance care planning, and clarification of goals. Return in 2 weeks or prn.  I spent 60 minutes providing this consultation. More than 50% of the time in this consultation was spent in counseling and care coordination.   PPS: 40%  HOSPICE ELIGIBILITY/DIAGNOSIS: TBD  Chief Complaint: Palliative Medicine follow up visit.   HISTORY OF PRESENT ILLNESS:  Dennis Walczak Lepkowski Sr. is a 75 y.o. year old male  with chronic back pain, bilateral sciatica, OA, Parkinson's, Addison's disease, CAD, paroxysmal atrial fibrillation, diastolic HF, hypertension, gout, hx of TIA, CVA.   Patient reports worsening generalized pain, right hip pain. He states pain is unbearable. Patient is having worsening generalized pain, not getting relief with current regimen. He is currently receiving fentanyl patch 150mg every 72 hours, ibuprofen '800mg'$  every 8 hours, oxycodone 10/'325mg'$  every 4 hours prn, Flexeril prn, lidocaine patch to hip, he also has a back stimulator. Medications tried in the past were oxycodone, Xtampza, morphine with ineffectiveness. Dilaudid used in hospital with effectiveness.  He is declining functionally. 6 months ago, he was able to ambulate with cane ad lib. Now he is only able to take 10 steps or less and often requires assist x 2 to stand/transfer; tremor is also worsening. He was discharged yesterday from therapy due to not progressing. He would like to be managed in the home. Due to his pain, his appetite has declined. He is eating 25-75% of meals. His edema is worsening to  LE and abdomen. He has dropped from a size 4 xl to 3x, but not much change reflected in his weight likely due to fluid. Weight 344 pounds on 02/05/21. Depression has worsened due to constant pain.    History obtained from review of EMR, discussion with primary team, and interview with family, facility staff/caregiver and/or Dennis [name].  I reviewed available labs, medications, imaging, studies and related documents from the EMR.   Records reviewed and summarized above.   ROS  General: NAD EYES: denies vision changes ENMT: denies dysphagia Cardiovascular: denies chest pain Pulmonary: denies cough Abdomen: denies constipation, endorses continence of bowel GU: denies dysuria, endorses continence of urine MSK:  weakness,  no falls reported Skin: denies rashes or wounds Neurological: denies pain, denies insomnia Psych: Endorses depressed Heme/lymph/immuno: denies bruises, abnormal bleeding  Physical Exam:  PE deferred due to Tele health visit.    Thank you for the opportunity to participate in the care of Dennis [name].  The palliative care team will continue to follow. Please call our office at [phone] if we can be of additional assistance.   Ezekiel Slocumb, NP   COVID-19 PATIENT SCREENING TOOL Asked and negative response unless otherwise noted:   Have you had symptoms of covid, tested positive or been in contact with someone with symptoms/positive test in the past 5-10 days? No

## 2021-02-17 DIAGNOSIS — Z91030 Bee allergy status: Secondary | ICD-10-CM

## 2021-02-17 MED ORDER — EPINEPHRINE 0.3 MG/0.3ML IJ SOAJ: 0.3000 mg | INTRAMUSCULAR | 0 refills | Status: AC | PRN

## 2021-02-22 ENCOUNTER — Other Ambulatory Visit: Payer: Medicare Other | Admitting: Student

## 2021-02-22 ENCOUNTER — Other Ambulatory Visit: Payer: Self-pay

## 2021-02-22 DIAGNOSIS — Z515 Encounter for palliative care: Secondary | ICD-10-CM | POA: Diagnosis not present

## 2021-02-22 DIAGNOSIS — L304 Erythema intertrigo: Secondary | ICD-10-CM | POA: Diagnosis not present

## 2021-02-22 DIAGNOSIS — R52 Pain, unspecified: Secondary | ICD-10-CM

## 2021-02-22 NOTE — Progress Notes (Signed)
[facility] Consult Note Telephone: [phone]  Fax: [phone]    Date of encounter: 02/22/21 PATIENT NAME: Dennis Nations Riggenbach Sr. [place] [address]   [phone] (home)  DOB: [birth year] MRN: [id number] PRIMARY CARE PROVIDER:    Jinny Sanders, MD,  [place] [address] [phone]  REFERRING PROVIDER:   Jinny Sanders, MD [address] [place],  [place] [address] [phone]  RESPONSIBLE PARTY:    Contact Information     Name Relation Home Work Mobile   Lindahl,Cheryl Spouse [phone]  [phone]   Dugal,Tait Pandora Leiter [phone]          I met face to face with patient and family in the home. Palliative Care was asked to follow this patient by consultation request of  Jinny Sanders, MD to address advance care planning and complex medical decision making. This is a follow up visit.                                   ASSESSMENT AND PLAN / RECOMMENDATIONS:   Advance Care Planning/Goals of Care: Goals include to maximize quality of life and symptom management.   CODE STATUS: DNR  Symptom Management/Plan:  Pain-patient continues to wear Duragesic patch; has adjusted to dilaudid to every 4 hours and still not getting good pain relief. He has taken the oxycodone earlier today and expresses some relief. We discussed stopping the dilaudid since he feels this is not helping. Patient to restart oxycodone 10 mg every 4 hours PRN, continue Duragesic patch 100 mcg changed every 72 hours, continue ibuprofen 800 mg every 6 hours. Flexeril PRN, duloxetine 60 mg BID. Will follow up with patient and wife on Thursday to evaluate pain regimen. Discussed chronic pain, treatment options with hospice medical director.   Intertrigo-patient with rash under bilateral arms. Recommend cleanse area with soap, water, pat dry. Apply zeasorb powder BID x 14 days. Monitor for improvement.    Follow  up Palliative Care Visit: Palliative care will continue to follow for complex medical decision making, advance care planning, and clarification of goals. Will follow up within one week to discuss medication regimen, evaluate for effectiveness. Return in 4 weeks or prn.  I spent 60 minutes providing this consultation. More than 50% of the time in this consultation was spent in counseling and care coordination.   PPS: 40%  HOSPICE ELIGIBILITY/DIAGNOSIS: TBD  Chief Complaint: Palliative Medicine follow up visit.   HISTORY OF PRESENT ILLNESS:  Dennis Loeber Uno Sr. is a 75 y.o. year old male  with chronic back pain, bilateral sciatica, OA, Parkinson's, Addison's disease, CAD, paroxysmal atrial fibrillation, diastolic HF, hypertension, gout, hx of TIA, CVA.   Patient reports little improvement in pain since starting dilaudid. He did have occaisional moments where pain was somewhat better, then he returned back to severe pain. He reports hurting all over; pain is 9 or 10 all of the time. He states if his pain was down to 5 or 6, this would be manageable. He states he did take an oxycodone earlier today and received some relief. His appetite has continued to decline due to his pain.   History obtained from review of EMR, discussion with primary team, and interview with family, facility staff/caregiver and/or Mr. Putzier.  I reviewed available labs, medications, imaging, studies and related documents from the EMR.  Records  reviewed and summarized above.   ROS  General: NAD EYES: denies vision changes ENMT: denies dysphagia Cardiovascular: denies chest pain Pulmonary: denies cough, denies increased SOB Abdomen: denies constipation, endorses continence of bowel GU: denies dysuria, endorses continence of urine MSK: weakness,  no falls reported Skin: rash under bilateral arms Neurological: denies insomnia Psych: Endorses depressed mood; restarted duloxetine Heme/lymph/immuno: denies bruises, abnormal  bleeding  Physical Exam:  Constitutional: NAD General: frail appearing, obese EYES: anicteric sclera, lids intact, no discharge  ENMT: intact hearing, oral mucous membranes moist CV: S1S2, RRR, no LE edema Pulmonary: LCTA, no increased work of breathing, no cough, room air Abdomen: normo-active BS + 4 quadrants, soft and non tender GU: deferred MSK: moves all extremities, ambulatory Skin: warm and dry, mild erythema to bilateral legs, rash under bilateral arms, erythema present, no open areas Neuro: generalized weakness, A & O x 3 Psych: non-anxious affect Hem/lymph/immuno: no widespread bruising   Thank you for the opportunity to participate in the care of Dennis [name].  The palliative care team will continue to follow. Please call our office at [phone] if we can be of additional assistance.   Ezekiel Slocumb, NP   COVID-19 PATIENT SCREENING TOOL Asked and negative response unless otherwise noted:   Have you had symptoms of covid, tested positive or been in contact with someone with symptoms/positive test in the past 5-10 days? No

## 2021-02-25 ENCOUNTER — Telehealth: Payer: Self-pay | Admitting: Student

## 2021-02-26 NOTE — Telephone Encounter (Signed)
Palliative NP f/u on current pain regimen. Patient states his hip pain is better with taking the ibuprofen routinely. He also expresses getting relief with taking the oxycodone '20mg'$  PRN; continues with duragesic patch. He states patch is helping some, while his wife and son are not sure that patch is really helping. We discussed switching back to Oxycodone ER and prn oxycodone or trying methadone. Patient appears to have hyperalgesia as he has been trialed on multiple medications, but not getting good relief. Patient to continue current regimen for now, will provide literature and additional education on methadone, review his current medications/history for any contraindications.

## 2021-03-02 ENCOUNTER — Telehealth: Payer: Self-pay | Admitting: Family Medicine

## 2021-03-02 ENCOUNTER — Telehealth: Payer: Self-pay | Admitting: Student

## 2021-03-02 NOTE — Telephone Encounter (Signed)
Telephone call: 253pm. Palliative NP returned call to patient's wife to schedule follow up appointment to discuss treatment options. NP had sent wife literature on methadone. Wife is open to virtual visit tomorrow at 130pm. She also mentions considering another hospice to help get patient's pain managed.

## 2021-03-02 NOTE — Telephone Encounter (Signed)
Mrs. Dennis [name] [facility] due to his [name] stated that he is in really bad shape and he is crying in pain for the past year and wanted to bring in hospice.   And wanted to know if she could get a verbal due to they can get it started as soon as tomorrow and wanted to know about including office notes , and his med list. Wanted to know about continuing as attending physician  And it can be faxed [phone]

## 2021-03-02 NOTE — Telephone Encounter (Signed)
Okay to given verbal order for hospice.   I will be attending but appreciate any hospice MD recommendations  Send info as requested.

## 2021-03-03 ENCOUNTER — Other Ambulatory Visit: Payer: Medicare Other | Admitting: Student

## 2021-03-03 ENCOUNTER — Other Ambulatory Visit: Payer: Self-pay

## 2021-03-03 DIAGNOSIS — R52 Pain, unspecified: Secondary | ICD-10-CM

## 2021-03-03 DIAGNOSIS — Z515 Encounter for palliative care: Secondary | ICD-10-CM

## 2021-03-03 NOTE — Progress Notes (Signed)
[facility] Consult Note Telephone: [phone]  Fax: [phone]    Date of encounter: 03/03/21 1:52 PM PATIENT NAME: Dennis [name] [id number]   [phone] (home)  DOB: Oct 24, 1945 MRN: [id number] PRIMARY CARE PROVIDER:    Jinny Sanders, MD,  [place] [address] [phone]  REFERRING PROVIDER:   Jinny Sanders, MD [address] [place],  [place] [address] [phone]  RESPONSIBLE PARTY:    Contact Information     Name Relation Home Work Mobile   Jeanpaul,Cheryl Spouse [phone]  [phone]   Fortunato,Pruitt Pandora Leiter [phone]         Due to the COVID-19 crisis, this visit was done via telemedicine from my office and it was initiated and consent by this patient and or family.  I connected with  Dennis Nations Fill Sr. on 03/03/2021 by a video enabled telemedicine application and verified that I am speaking with the correct person using two identifiers.   I discussed the limitations of evaluation and management by telemedicine. The patient expressed understanding and agreed to proceed.                                    ASSESSMENT AND PLAN / RECOMMENDATIONS:   Advance Care Planning/Goals of Care: Goals include to maximize quality of life and symptom management.   Patient would like to limit hospitalizations, be managed in the home as best as possible. Education provided on palliative medicine vs. Hospice services. He has an appointment with another hospice agency to be evaluated, discuss hospice services.   Palliative NP reviewed recent changes and declines with hospice physicians. Given his recent declines, he would now be eligible for hospice evaluation. Message left for patient's wife; awaiting return call.   CODE STATUS: DNR  Symptom Management/Plan:  Pain-reviewed current interventions in place; patient not receiving good pain control. Education  provided on hyperalgesia. We discussed transitioning to methadone. Education and literature provided on methadone. Patient is open to trying methadone. He would like to discuss with another hospice tomorrow and will decide on plan going forward.  Follow up Palliative Care Visit: Palliative care will continue to follow for complex medical decision making, advance care planning, and clarification of goals. Return weeks or prn.  I spent 40 minutes providing this consultation. More than 50% of the time in this consultation was spent in counseling and care coordination.   PPS: 40%, weak  HOSPICE ELIGIBILITY/DIAGNOSIS: TBD  Chief Complaint: Palliative medicine follow up visit.   HISTORY OF PRESENT ILLNESS:  Dennis Palermo Palinkas Sr. is a 75 y.o. year old male  with chronic back pain, bilateral sciatica, OA, Parkinson's, Addison's disease, CAD, paroxysmal atrial fibrillation, diastolic HF, hypertension, gout, hx of TIA, CVA.   Patient continues to have severe pain in spite of current interventions for his pain. He does state that his hip bursitis has been better with taking ibuprofen routinely. He continues with Duragesic patch 100 mcg/hr changed every 3 days. He is now taking oxycodone 20 mg every 4 hours PRN. Patient has become more withdrawn, increased weakness. He is unable to tolerate standing for short periods of time. He becomes easily fatigued with any adl's such as bathing, dressing. He is napping more throughout the day. His appetite continues to decline; he is now eating 1 good meal a day. No recent weight  in past 2 weeks, although his clothes are fitting looser.   History obtained from review of EMR, discussion with primary team, and interview with family, facility staff/caregiver and/or Dennis [name].  I reviewed available labs, medications, imaging, studies and related documents from the EMR.  Records reviewed and summarized above.   ROS  General: NAD EYES: denies vision changes ENMT: denies  dysphagia Cardiovascular: denies chest pain Pulmonary: denies cough, SOB with exertion Abdomen: denies constipation, endorses continence of bowel GU: denies dysuria MSK: weakness,  no falls reported Skin: denies rashes or wounds Neurological: denies pain, denies insomnia Psych: depressed mood Heme/lymph/immuno: denies bruises, abnormal bleeding  Physical Exam  PE deferred due to this being a Telemedicine visit.   Thank you for the opportunity to participate in the care of [name].  The palliative care team will continue to follow. Please call our office at [phone] if we can be of additional assistance.   Ezekiel Slocumb, NP   COVID-19 PATIENT SCREENING TOOL Asked and negative response unless otherwise noted:   Have you had symptoms of covid, tested positive or been in contact with someone with symptoms/positive test in the past 5-10 days?  No

## 2021-03-03 NOTE — Telephone Encounter (Signed)
Estill Bamberg notified as instructed by telephone.  Last office note and medication list faxed to [phone].

## 2021-03-04 ENCOUNTER — Ambulatory Visit: Payer: Medicare Other | Admitting: Cardiovascular Disease

## 2021-03-04 DIAGNOSIS — E230 Hypopituitarism: Secondary | ICD-10-CM | POA: Diagnosis not present

## 2021-03-04 DIAGNOSIS — Z66 Do not resuscitate: Secondary | ICD-10-CM | POA: Diagnosis not present

## 2021-03-04 DIAGNOSIS — F320 Major depressive disorder, single episode, mild: Secondary | ICD-10-CM | POA: Diagnosis not present

## 2021-03-04 DIAGNOSIS — M5441 Lumbago with sciatica, right side: Secondary | ICD-10-CM | POA: Diagnosis not present

## 2021-03-04 DIAGNOSIS — I130 Hypertensive heart and chronic kidney disease with heart failure and stage 1 through stage 4 chronic kidney disease, or unspecified chronic kidney disease: Secondary | ICD-10-CM | POA: Diagnosis not present

## 2021-03-04 DIAGNOSIS — M5442 Lumbago with sciatica, left side: Secondary | ICD-10-CM | POA: Diagnosis not present

## 2021-03-04 DIAGNOSIS — G20 Parkinson's disease: Secondary | ICD-10-CM | POA: Diagnosis not present

## 2021-03-04 DIAGNOSIS — I5032 Chronic diastolic (congestive) heart failure: Secondary | ICD-10-CM | POA: Diagnosis not present

## 2021-03-04 DIAGNOSIS — I480 Paroxysmal atrial fibrillation: Secondary | ICD-10-CM | POA: Diagnosis not present

## 2021-03-06 DIAGNOSIS — E230 Hypopituitarism: Secondary | ICD-10-CM | POA: Diagnosis not present

## 2021-03-06 DIAGNOSIS — F320 Major depressive disorder, single episode, mild: Secondary | ICD-10-CM | POA: Diagnosis not present

## 2021-03-06 DIAGNOSIS — I480 Paroxysmal atrial fibrillation: Secondary | ICD-10-CM | POA: Diagnosis not present

## 2021-03-06 DIAGNOSIS — I130 Hypertensive heart and chronic kidney disease with heart failure and stage 1 through stage 4 chronic kidney disease, or unspecified chronic kidney disease: Secondary | ICD-10-CM | POA: Diagnosis not present

## 2021-03-06 DIAGNOSIS — G20 Parkinson's disease: Secondary | ICD-10-CM | POA: Diagnosis not present

## 2021-03-06 DIAGNOSIS — I5032 Chronic diastolic (congestive) heart failure: Secondary | ICD-10-CM | POA: Diagnosis not present

## 2021-03-08 DIAGNOSIS — G20 Parkinson's disease: Secondary | ICD-10-CM | POA: Diagnosis not present

## 2021-03-08 DIAGNOSIS — I480 Paroxysmal atrial fibrillation: Secondary | ICD-10-CM | POA: Diagnosis not present

## 2021-03-08 DIAGNOSIS — I130 Hypertensive heart and chronic kidney disease with heart failure and stage 1 through stage 4 chronic kidney disease, or unspecified chronic kidney disease: Secondary | ICD-10-CM | POA: Diagnosis not present

## 2021-03-08 DIAGNOSIS — I5032 Chronic diastolic (congestive) heart failure: Secondary | ICD-10-CM | POA: Diagnosis not present

## 2021-03-08 DIAGNOSIS — F320 Major depressive disorder, single episode, mild: Secondary | ICD-10-CM | POA: Diagnosis not present

## 2021-03-08 DIAGNOSIS — E230 Hypopituitarism: Secondary | ICD-10-CM | POA: Diagnosis not present

## 2021-03-10 DIAGNOSIS — I480 Paroxysmal atrial fibrillation: Secondary | ICD-10-CM | POA: Diagnosis not present

## 2021-03-10 DIAGNOSIS — G20 Parkinson's disease: Secondary | ICD-10-CM | POA: Diagnosis not present

## 2021-03-10 DIAGNOSIS — I5032 Chronic diastolic (congestive) heart failure: Secondary | ICD-10-CM | POA: Diagnosis not present

## 2021-03-10 DIAGNOSIS — I130 Hypertensive heart and chronic kidney disease with heart failure and stage 1 through stage 4 chronic kidney disease, or unspecified chronic kidney disease: Secondary | ICD-10-CM | POA: Diagnosis not present

## 2021-03-10 DIAGNOSIS — E230 Hypopituitarism: Secondary | ICD-10-CM | POA: Diagnosis not present

## 2021-03-10 DIAGNOSIS — F320 Major depressive disorder, single episode, mild: Secondary | ICD-10-CM | POA: Diagnosis not present

## 2021-03-11 DIAGNOSIS — Z66 Do not resuscitate: Secondary | ICD-10-CM | POA: Diagnosis not present

## 2021-03-11 DIAGNOSIS — I110 Hypertensive heart disease with heart failure: Secondary | ICD-10-CM | POA: Diagnosis not present

## 2021-03-11 DIAGNOSIS — M5442 Lumbago with sciatica, left side: Secondary | ICD-10-CM | POA: Diagnosis not present

## 2021-03-11 DIAGNOSIS — F320 Major depressive disorder, single episode, mild: Secondary | ICD-10-CM | POA: Diagnosis not present

## 2021-03-11 DIAGNOSIS — I5032 Chronic diastolic (congestive) heart failure: Secondary | ICD-10-CM | POA: Diagnosis not present

## 2021-03-11 DIAGNOSIS — M5441 Lumbago with sciatica, right side: Secondary | ICD-10-CM | POA: Diagnosis not present

## 2021-03-11 DIAGNOSIS — G20 Parkinson's disease: Secondary | ICD-10-CM | POA: Diagnosis not present

## 2021-03-11 DIAGNOSIS — I480 Paroxysmal atrial fibrillation: Secondary | ICD-10-CM | POA: Diagnosis not present

## 2021-03-11 DIAGNOSIS — E230 Hypopituitarism: Secondary | ICD-10-CM | POA: Diagnosis not present

## 2021-03-20 DIAGNOSIS — G20 Parkinson's disease: Secondary | ICD-10-CM | POA: Diagnosis not present

## 2021-03-20 DIAGNOSIS — I480 Paroxysmal atrial fibrillation: Secondary | ICD-10-CM | POA: Diagnosis not present

## 2021-03-20 DIAGNOSIS — I5032 Chronic diastolic (congestive) heart failure: Secondary | ICD-10-CM | POA: Diagnosis not present

## 2021-03-20 DIAGNOSIS — E230 Hypopituitarism: Secondary | ICD-10-CM | POA: Diagnosis not present

## 2021-03-20 DIAGNOSIS — I110 Hypertensive heart disease with heart failure: Secondary | ICD-10-CM | POA: Diagnosis not present

## 2021-03-20 DIAGNOSIS — F320 Major depressive disorder, single episode, mild: Secondary | ICD-10-CM | POA: Diagnosis not present

## 2021-03-22 DIAGNOSIS — G20 Parkinson's disease: Secondary | ICD-10-CM | POA: Diagnosis not present

## 2021-03-22 DIAGNOSIS — I110 Hypertensive heart disease with heart failure: Secondary | ICD-10-CM | POA: Diagnosis not present

## 2021-03-22 DIAGNOSIS — I5032 Chronic diastolic (congestive) heart failure: Secondary | ICD-10-CM | POA: Diagnosis not present

## 2021-03-22 DIAGNOSIS — I480 Paroxysmal atrial fibrillation: Secondary | ICD-10-CM | POA: Diagnosis not present

## 2021-03-22 DIAGNOSIS — F320 Major depressive disorder, single episode, mild: Secondary | ICD-10-CM | POA: Diagnosis not present

## 2021-03-22 DIAGNOSIS — E230 Hypopituitarism: Secondary | ICD-10-CM | POA: Diagnosis not present

## 2021-03-25 DIAGNOSIS — I5032 Chronic diastolic (congestive) heart failure: Secondary | ICD-10-CM | POA: Diagnosis not present

## 2021-03-25 DIAGNOSIS — F320 Major depressive disorder, single episode, mild: Secondary | ICD-10-CM | POA: Diagnosis not present

## 2021-03-25 DIAGNOSIS — I110 Hypertensive heart disease with heart failure: Secondary | ICD-10-CM | POA: Diagnosis not present

## 2021-03-25 DIAGNOSIS — E230 Hypopituitarism: Secondary | ICD-10-CM | POA: Diagnosis not present

## 2021-03-25 DIAGNOSIS — G20 Parkinson's disease: Secondary | ICD-10-CM | POA: Diagnosis not present

## 2021-03-25 DIAGNOSIS — I480 Paroxysmal atrial fibrillation: Secondary | ICD-10-CM | POA: Diagnosis not present

## 2021-03-27 DIAGNOSIS — I5032 Chronic diastolic (congestive) heart failure: Secondary | ICD-10-CM | POA: Diagnosis not present

## 2021-03-27 DIAGNOSIS — E230 Hypopituitarism: Secondary | ICD-10-CM | POA: Diagnosis not present

## 2021-03-27 DIAGNOSIS — I480 Paroxysmal atrial fibrillation: Secondary | ICD-10-CM | POA: Diagnosis not present

## 2021-03-27 DIAGNOSIS — I110 Hypertensive heart disease with heart failure: Secondary | ICD-10-CM | POA: Diagnosis not present

## 2021-03-27 DIAGNOSIS — G20 Parkinson's disease: Secondary | ICD-10-CM | POA: Diagnosis not present

## 2021-03-27 DIAGNOSIS — F320 Major depressive disorder, single episode, mild: Secondary | ICD-10-CM | POA: Diagnosis not present

## 2021-03-28 DIAGNOSIS — I480 Paroxysmal atrial fibrillation: Secondary | ICD-10-CM | POA: Diagnosis not present

## 2021-03-28 DIAGNOSIS — E230 Hypopituitarism: Secondary | ICD-10-CM | POA: Diagnosis not present

## 2021-03-28 DIAGNOSIS — I5032 Chronic diastolic (congestive) heart failure: Secondary | ICD-10-CM | POA: Diagnosis not present

## 2021-03-28 DIAGNOSIS — F320 Major depressive disorder, single episode, mild: Secondary | ICD-10-CM | POA: Diagnosis not present

## 2021-03-28 DIAGNOSIS — G20 Parkinson's disease: Secondary | ICD-10-CM | POA: Diagnosis not present

## 2021-03-28 DIAGNOSIS — I110 Hypertensive heart disease with heart failure: Secondary | ICD-10-CM | POA: Diagnosis not present

## 2021-03-29 DIAGNOSIS — I5032 Chronic diastolic (congestive) heart failure: Secondary | ICD-10-CM | POA: Diagnosis not present

## 2021-03-29 DIAGNOSIS — G20 Parkinson's disease: Secondary | ICD-10-CM | POA: Diagnosis not present

## 2021-03-29 DIAGNOSIS — F320 Major depressive disorder, single episode, mild: Secondary | ICD-10-CM | POA: Diagnosis not present

## 2021-03-29 DIAGNOSIS — I480 Paroxysmal atrial fibrillation: Secondary | ICD-10-CM | POA: Diagnosis not present

## 2021-03-29 DIAGNOSIS — E230 Hypopituitarism: Secondary | ICD-10-CM | POA: Diagnosis not present

## 2021-03-29 DIAGNOSIS — I110 Hypertensive heart disease with heart failure: Secondary | ICD-10-CM | POA: Diagnosis not present

## 2021-04-01 DIAGNOSIS — F320 Major depressive disorder, single episode, mild: Secondary | ICD-10-CM | POA: Diagnosis not present

## 2021-04-01 DIAGNOSIS — I5032 Chronic diastolic (congestive) heart failure: Secondary | ICD-10-CM | POA: Diagnosis not present

## 2021-04-01 DIAGNOSIS — E230 Hypopituitarism: Secondary | ICD-10-CM | POA: Diagnosis not present

## 2021-04-01 DIAGNOSIS — I110 Hypertensive heart disease with heart failure: Secondary | ICD-10-CM | POA: Diagnosis not present

## 2021-04-01 DIAGNOSIS — I480 Paroxysmal atrial fibrillation: Secondary | ICD-10-CM | POA: Diagnosis not present

## 2021-04-01 DIAGNOSIS — G20 Parkinson's disease: Secondary | ICD-10-CM | POA: Diagnosis not present

## 2021-04-07 DIAGNOSIS — I5032 Chronic diastolic (congestive) heart failure: Secondary | ICD-10-CM | POA: Diagnosis not present

## 2021-04-07 DIAGNOSIS — F320 Major depressive disorder, single episode, mild: Secondary | ICD-10-CM | POA: Diagnosis not present

## 2021-04-07 DIAGNOSIS — I110 Hypertensive heart disease with heart failure: Secondary | ICD-10-CM | POA: Diagnosis not present

## 2021-04-07 DIAGNOSIS — G20 Parkinson's disease: Secondary | ICD-10-CM | POA: Diagnosis not present

## 2021-04-07 DIAGNOSIS — I480 Paroxysmal atrial fibrillation: Secondary | ICD-10-CM | POA: Diagnosis not present

## 2021-04-07 DIAGNOSIS — E230 Hypopituitarism: Secondary | ICD-10-CM | POA: Diagnosis not present

## 2021-04-10 DIAGNOSIS — M5442 Lumbago with sciatica, left side: Secondary | ICD-10-CM | POA: Diagnosis not present

## 2021-04-10 DIAGNOSIS — F320 Major depressive disorder, single episode, mild: Secondary | ICD-10-CM | POA: Diagnosis not present

## 2021-04-10 DIAGNOSIS — Z66 Do not resuscitate: Secondary | ICD-10-CM | POA: Diagnosis not present

## 2021-04-10 DIAGNOSIS — E230 Hypopituitarism: Secondary | ICD-10-CM | POA: Diagnosis not present

## 2021-04-10 DIAGNOSIS — I5032 Chronic diastolic (congestive) heart failure: Secondary | ICD-10-CM | POA: Diagnosis not present

## 2021-04-10 DIAGNOSIS — I480 Paroxysmal atrial fibrillation: Secondary | ICD-10-CM | POA: Diagnosis not present

## 2021-04-10 DIAGNOSIS — I110 Hypertensive heart disease with heart failure: Secondary | ICD-10-CM | POA: Diagnosis not present

## 2021-04-10 DIAGNOSIS — G20 Parkinson's disease: Secondary | ICD-10-CM | POA: Diagnosis not present

## 2021-04-10 DIAGNOSIS — M5441 Lumbago with sciatica, right side: Secondary | ICD-10-CM | POA: Diagnosis not present

## 2021-04-14 DIAGNOSIS — I110 Hypertensive heart disease with heart failure: Secondary | ICD-10-CM | POA: Diagnosis not present

## 2021-04-14 DIAGNOSIS — G20 Parkinson's disease: Secondary | ICD-10-CM | POA: Diagnosis not present

## 2021-04-14 DIAGNOSIS — F320 Major depressive disorder, single episode, mild: Secondary | ICD-10-CM | POA: Diagnosis not present

## 2021-04-14 DIAGNOSIS — I480 Paroxysmal atrial fibrillation: Secondary | ICD-10-CM | POA: Diagnosis not present

## 2021-04-14 DIAGNOSIS — E230 Hypopituitarism: Secondary | ICD-10-CM | POA: Diagnosis not present

## 2021-04-14 DIAGNOSIS — I5032 Chronic diastolic (congestive) heart failure: Secondary | ICD-10-CM | POA: Diagnosis not present

## 2021-04-18 DIAGNOSIS — G20 Parkinson's disease: Secondary | ICD-10-CM | POA: Diagnosis not present

## 2021-04-18 DIAGNOSIS — E230 Hypopituitarism: Secondary | ICD-10-CM | POA: Diagnosis not present

## 2021-04-18 DIAGNOSIS — I5032 Chronic diastolic (congestive) heart failure: Secondary | ICD-10-CM | POA: Diagnosis not present

## 2021-04-18 DIAGNOSIS — I110 Hypertensive heart disease with heart failure: Secondary | ICD-10-CM | POA: Diagnosis not present

## 2021-04-18 DIAGNOSIS — F320 Major depressive disorder, single episode, mild: Secondary | ICD-10-CM | POA: Diagnosis not present

## 2021-04-18 DIAGNOSIS — I480 Paroxysmal atrial fibrillation: Secondary | ICD-10-CM | POA: Diagnosis not present

## 2021-04-20 DIAGNOSIS — G20 Parkinson's disease: Secondary | ICD-10-CM | POA: Diagnosis not present

## 2021-04-20 DIAGNOSIS — I110 Hypertensive heart disease with heart failure: Secondary | ICD-10-CM | POA: Diagnosis not present

## 2021-04-20 DIAGNOSIS — I5032 Chronic diastolic (congestive) heart failure: Secondary | ICD-10-CM | POA: Diagnosis not present

## 2021-04-20 DIAGNOSIS — I480 Paroxysmal atrial fibrillation: Secondary | ICD-10-CM | POA: Diagnosis not present

## 2021-04-20 DIAGNOSIS — F320 Major depressive disorder, single episode, mild: Secondary | ICD-10-CM | POA: Diagnosis not present

## 2021-04-20 DIAGNOSIS — E230 Hypopituitarism: Secondary | ICD-10-CM | POA: Diagnosis not present

## 2021-04-27 DIAGNOSIS — I110 Hypertensive heart disease with heart failure: Secondary | ICD-10-CM | POA: Diagnosis not present

## 2021-04-27 DIAGNOSIS — I480 Paroxysmal atrial fibrillation: Secondary | ICD-10-CM | POA: Diagnosis not present

## 2021-04-27 DIAGNOSIS — F320 Major depressive disorder, single episode, mild: Secondary | ICD-10-CM | POA: Diagnosis not present

## 2021-04-27 DIAGNOSIS — I5032 Chronic diastolic (congestive) heart failure: Secondary | ICD-10-CM | POA: Diagnosis not present

## 2021-04-27 DIAGNOSIS — G20 Parkinson's disease: Secondary | ICD-10-CM | POA: Diagnosis not present

## 2021-04-27 DIAGNOSIS — E230 Hypopituitarism: Secondary | ICD-10-CM | POA: Diagnosis not present

## 2021-04-29 ENCOUNTER — Other Ambulatory Visit: Payer: Self-pay | Admitting: Family Medicine

## 2021-04-29 DIAGNOSIS — G20 Parkinson's disease: Secondary | ICD-10-CM | POA: Diagnosis not present

## 2021-04-29 DIAGNOSIS — I480 Paroxysmal atrial fibrillation: Secondary | ICD-10-CM | POA: Diagnosis not present

## 2021-04-29 DIAGNOSIS — F320 Major depressive disorder, single episode, mild: Secondary | ICD-10-CM | POA: Diagnosis not present

## 2021-04-29 DIAGNOSIS — I5032 Chronic diastolic (congestive) heart failure: Secondary | ICD-10-CM | POA: Diagnosis not present

## 2021-04-29 DIAGNOSIS — E230 Hypopituitarism: Secondary | ICD-10-CM | POA: Diagnosis not present

## 2021-04-29 DIAGNOSIS — I110 Hypertensive heart disease with heart failure: Secondary | ICD-10-CM | POA: Diagnosis not present

## 2021-04-29 MED ORDER — FENTANYL 100 MCG/HR TD PT72: 1.0000 | MEDICATED_PATCH | TRANSDERMAL | 0 refills | Status: AC

## 2021-04-29 NOTE — Telephone Encounter (Signed)
Last office visit 09/24/2020 for establish care.  Patient currently under hospice care.

## 2021-04-29 NOTE — Telephone Encounter (Signed)
Dennis [name] with [facility] called in to see if pt can get a fill of fentanyl 50mg  patches. [address] [phone]. Call back for Dennis [name] is [phone]

## 2021-05-03 DIAGNOSIS — I5032 Chronic diastolic (congestive) heart failure: Secondary | ICD-10-CM | POA: Diagnosis not present

## 2021-05-03 DIAGNOSIS — E230 Hypopituitarism: Secondary | ICD-10-CM | POA: Diagnosis not present

## 2021-05-03 DIAGNOSIS — G20 Parkinson's disease: Secondary | ICD-10-CM | POA: Diagnosis not present

## 2021-05-03 DIAGNOSIS — I110 Hypertensive heart disease with heart failure: Secondary | ICD-10-CM | POA: Diagnosis not present

## 2021-05-03 DIAGNOSIS — I480 Paroxysmal atrial fibrillation: Secondary | ICD-10-CM | POA: Diagnosis not present

## 2021-05-03 DIAGNOSIS — F320 Major depressive disorder, single episode, mild: Secondary | ICD-10-CM | POA: Diagnosis not present

## 2021-05-07 DIAGNOSIS — N481 Balanitis: Secondary | ICD-10-CM | POA: Diagnosis not present

## 2021-05-07 DIAGNOSIS — R3915 Urgency of urination: Secondary | ICD-10-CM | POA: Diagnosis not present

## 2021-05-07 DIAGNOSIS — N3946 Mixed incontinence: Secondary | ICD-10-CM | POA: Diagnosis not present

## 2021-05-07 DIAGNOSIS — R350 Frequency of micturition: Secondary | ICD-10-CM | POA: Diagnosis not present

## 2021-05-11 DIAGNOSIS — F320 Major depressive disorder, single episode, mild: Secondary | ICD-10-CM | POA: Diagnosis not present

## 2021-05-11 DIAGNOSIS — M5441 Lumbago with sciatica, right side: Secondary | ICD-10-CM | POA: Diagnosis not present

## 2021-05-11 DIAGNOSIS — I5032 Chronic diastolic (congestive) heart failure: Secondary | ICD-10-CM | POA: Diagnosis not present

## 2021-05-11 DIAGNOSIS — M5442 Lumbago with sciatica, left side: Secondary | ICD-10-CM | POA: Diagnosis not present

## 2021-05-11 DIAGNOSIS — I480 Paroxysmal atrial fibrillation: Secondary | ICD-10-CM | POA: Diagnosis not present

## 2021-05-11 DIAGNOSIS — I110 Hypertensive heart disease with heart failure: Secondary | ICD-10-CM | POA: Diagnosis not present

## 2021-05-11 DIAGNOSIS — G20 Parkinson's disease: Secondary | ICD-10-CM | POA: Diagnosis not present

## 2021-05-11 DIAGNOSIS — Z66 Do not resuscitate: Secondary | ICD-10-CM | POA: Diagnosis not present

## 2021-05-11 DIAGNOSIS — E230 Hypopituitarism: Secondary | ICD-10-CM | POA: Diagnosis not present

## 2021-05-17 DIAGNOSIS — F320 Major depressive disorder, single episode, mild: Secondary | ICD-10-CM | POA: Diagnosis not present

## 2021-05-17 DIAGNOSIS — G20 Parkinson's disease: Secondary | ICD-10-CM | POA: Diagnosis not present

## 2021-05-17 DIAGNOSIS — I480 Paroxysmal atrial fibrillation: Secondary | ICD-10-CM | POA: Diagnosis not present

## 2021-05-17 DIAGNOSIS — I5032 Chronic diastolic (congestive) heart failure: Secondary | ICD-10-CM | POA: Diagnosis not present

## 2021-05-17 DIAGNOSIS — I110 Hypertensive heart disease with heart failure: Secondary | ICD-10-CM | POA: Diagnosis not present

## 2021-05-17 DIAGNOSIS — E230 Hypopituitarism: Secondary | ICD-10-CM | POA: Diagnosis not present

## 2021-05-20 DIAGNOSIS — I110 Hypertensive heart disease with heart failure: Secondary | ICD-10-CM | POA: Diagnosis not present

## 2021-05-20 DIAGNOSIS — I480 Paroxysmal atrial fibrillation: Secondary | ICD-10-CM | POA: Diagnosis not present

## 2021-05-20 DIAGNOSIS — I5032 Chronic diastolic (congestive) heart failure: Secondary | ICD-10-CM | POA: Diagnosis not present

## 2021-05-20 DIAGNOSIS — F320 Major depressive disorder, single episode, mild: Secondary | ICD-10-CM | POA: Diagnosis not present

## 2021-05-20 DIAGNOSIS — E230 Hypopituitarism: Secondary | ICD-10-CM | POA: Diagnosis not present

## 2021-05-20 DIAGNOSIS — G20 Parkinson's disease: Secondary | ICD-10-CM | POA: Diagnosis not present

## 2021-05-24 DIAGNOSIS — I110 Hypertensive heart disease with heart failure: Secondary | ICD-10-CM | POA: Diagnosis not present

## 2021-05-24 DIAGNOSIS — E230 Hypopituitarism: Secondary | ICD-10-CM | POA: Diagnosis not present

## 2021-05-24 DIAGNOSIS — I480 Paroxysmal atrial fibrillation: Secondary | ICD-10-CM | POA: Diagnosis not present

## 2021-05-24 DIAGNOSIS — G20 Parkinson's disease: Secondary | ICD-10-CM | POA: Diagnosis not present

## 2021-05-24 DIAGNOSIS — F320 Major depressive disorder, single episode, mild: Secondary | ICD-10-CM | POA: Diagnosis not present

## 2021-05-24 DIAGNOSIS — I5032 Chronic diastolic (congestive) heart failure: Secondary | ICD-10-CM | POA: Diagnosis not present

## 2021-05-27 DIAGNOSIS — I5032 Chronic diastolic (congestive) heart failure: Secondary | ICD-10-CM | POA: Diagnosis not present

## 2021-05-27 DIAGNOSIS — I110 Hypertensive heart disease with heart failure: Secondary | ICD-10-CM | POA: Diagnosis not present

## 2021-05-27 DIAGNOSIS — F320 Major depressive disorder, single episode, mild: Secondary | ICD-10-CM | POA: Diagnosis not present

## 2021-05-27 DIAGNOSIS — G20 Parkinson's disease: Secondary | ICD-10-CM | POA: Diagnosis not present

## 2021-05-27 DIAGNOSIS — E230 Hypopituitarism: Secondary | ICD-10-CM | POA: Diagnosis not present

## 2021-05-27 DIAGNOSIS — I480 Paroxysmal atrial fibrillation: Secondary | ICD-10-CM | POA: Diagnosis not present

## 2021-05-29 DIAGNOSIS — G20 Parkinson's disease: Secondary | ICD-10-CM | POA: Diagnosis not present

## 2021-05-29 DIAGNOSIS — I110 Hypertensive heart disease with heart failure: Secondary | ICD-10-CM | POA: Diagnosis not present

## 2021-05-29 DIAGNOSIS — F320 Major depressive disorder, single episode, mild: Secondary | ICD-10-CM | POA: Diagnosis not present

## 2021-05-29 DIAGNOSIS — I480 Paroxysmal atrial fibrillation: Secondary | ICD-10-CM | POA: Diagnosis not present

## 2021-05-29 DIAGNOSIS — I5032 Chronic diastolic (congestive) heart failure: Secondary | ICD-10-CM | POA: Diagnosis not present

## 2021-05-29 DIAGNOSIS — E230 Hypopituitarism: Secondary | ICD-10-CM | POA: Diagnosis not present

## 2021-06-01 DIAGNOSIS — I5032 Chronic diastolic (congestive) heart failure: Secondary | ICD-10-CM | POA: Diagnosis not present

## 2021-06-01 DIAGNOSIS — E230 Hypopituitarism: Secondary | ICD-10-CM | POA: Diagnosis not present

## 2021-06-01 DIAGNOSIS — I480 Paroxysmal atrial fibrillation: Secondary | ICD-10-CM | POA: Diagnosis not present

## 2021-06-01 DIAGNOSIS — G20 Parkinson's disease: Secondary | ICD-10-CM | POA: Diagnosis not present

## 2021-06-01 DIAGNOSIS — I110 Hypertensive heart disease with heart failure: Secondary | ICD-10-CM | POA: Diagnosis not present

## 2021-06-01 DIAGNOSIS — F320 Major depressive disorder, single episode, mild: Secondary | ICD-10-CM | POA: Diagnosis not present

## 2021-06-06 ENCOUNTER — Other Ambulatory Visit: Payer: Self-pay | Admitting: Endocrinology

## 2021-06-07 DIAGNOSIS — E230 Hypopituitarism: Secondary | ICD-10-CM | POA: Diagnosis not present

## 2021-06-07 DIAGNOSIS — F320 Major depressive disorder, single episode, mild: Secondary | ICD-10-CM | POA: Diagnosis not present

## 2021-06-07 DIAGNOSIS — I110 Hypertensive heart disease with heart failure: Secondary | ICD-10-CM | POA: Diagnosis not present

## 2021-06-07 DIAGNOSIS — I480 Paroxysmal atrial fibrillation: Secondary | ICD-10-CM | POA: Diagnosis not present

## 2021-06-07 DIAGNOSIS — I5032 Chronic diastolic (congestive) heart failure: Secondary | ICD-10-CM | POA: Diagnosis not present

## 2021-06-07 DIAGNOSIS — G20 Parkinson's disease: Secondary | ICD-10-CM | POA: Diagnosis not present

## 2021-06-09 DIAGNOSIS — F320 Major depressive disorder, single episode, mild: Secondary | ICD-10-CM | POA: Diagnosis not present

## 2021-06-09 DIAGNOSIS — E230 Hypopituitarism: Secondary | ICD-10-CM | POA: Diagnosis not present

## 2021-06-09 DIAGNOSIS — I110 Hypertensive heart disease with heart failure: Secondary | ICD-10-CM | POA: Diagnosis not present

## 2021-06-09 DIAGNOSIS — G20 Parkinson's disease: Secondary | ICD-10-CM | POA: Diagnosis not present

## 2021-06-09 DIAGNOSIS — I5032 Chronic diastolic (congestive) heart failure: Secondary | ICD-10-CM | POA: Diagnosis not present

## 2021-06-09 DIAGNOSIS — I480 Paroxysmal atrial fibrillation: Secondary | ICD-10-CM | POA: Diagnosis not present

## 2021-06-10 DIAGNOSIS — M5442 Lumbago with sciatica, left side: Secondary | ICD-10-CM | POA: Diagnosis not present

## 2021-06-10 DIAGNOSIS — I110 Hypertensive heart disease with heart failure: Secondary | ICD-10-CM | POA: Diagnosis not present

## 2021-06-10 DIAGNOSIS — Z8546 Personal history of malignant neoplasm of prostate: Secondary | ICD-10-CM | POA: Diagnosis not present

## 2021-06-10 DIAGNOSIS — I480 Paroxysmal atrial fibrillation: Secondary | ICD-10-CM | POA: Diagnosis not present

## 2021-06-10 DIAGNOSIS — G20 Parkinson's disease: Secondary | ICD-10-CM | POA: Diagnosis not present

## 2021-06-10 DIAGNOSIS — M5441 Lumbago with sciatica, right side: Secondary | ICD-10-CM | POA: Diagnosis not present

## 2021-06-10 DIAGNOSIS — E230 Hypopituitarism: Secondary | ICD-10-CM | POA: Diagnosis not present

## 2021-06-10 DIAGNOSIS — I5032 Chronic diastolic (congestive) heart failure: Secondary | ICD-10-CM | POA: Diagnosis not present

## 2021-06-10 DIAGNOSIS — E039 Hypothyroidism, unspecified: Secondary | ICD-10-CM | POA: Diagnosis not present

## 2021-06-10 DIAGNOSIS — F320 Major depressive disorder, single episode, mild: Secondary | ICD-10-CM | POA: Diagnosis not present

## 2021-06-10 DIAGNOSIS — Z66 Do not resuscitate: Secondary | ICD-10-CM | POA: Diagnosis not present

## 2021-06-11 DIAGNOSIS — I110 Hypertensive heart disease with heart failure: Secondary | ICD-10-CM | POA: Diagnosis not present

## 2021-06-11 DIAGNOSIS — I480 Paroxysmal atrial fibrillation: Secondary | ICD-10-CM | POA: Diagnosis not present

## 2021-06-11 DIAGNOSIS — F320 Major depressive disorder, single episode, mild: Secondary | ICD-10-CM | POA: Diagnosis not present

## 2021-06-11 DIAGNOSIS — I5032 Chronic diastolic (congestive) heart failure: Secondary | ICD-10-CM | POA: Diagnosis not present

## 2021-06-11 DIAGNOSIS — G20 Parkinson's disease: Secondary | ICD-10-CM | POA: Diagnosis not present

## 2021-06-11 DIAGNOSIS — E230 Hypopituitarism: Secondary | ICD-10-CM | POA: Diagnosis not present

## 2021-06-14 DIAGNOSIS — I480 Paroxysmal atrial fibrillation: Secondary | ICD-10-CM | POA: Diagnosis not present

## 2021-06-14 DIAGNOSIS — I5032 Chronic diastolic (congestive) heart failure: Secondary | ICD-10-CM | POA: Diagnosis not present

## 2021-06-14 DIAGNOSIS — F320 Major depressive disorder, single episode, mild: Secondary | ICD-10-CM | POA: Diagnosis not present

## 2021-06-14 DIAGNOSIS — I110 Hypertensive heart disease with heart failure: Secondary | ICD-10-CM | POA: Diagnosis not present

## 2021-06-14 DIAGNOSIS — G20 Parkinson's disease: Secondary | ICD-10-CM | POA: Diagnosis not present

## 2021-06-14 DIAGNOSIS — E230 Hypopituitarism: Secondary | ICD-10-CM | POA: Diagnosis not present

## 2021-06-17 DIAGNOSIS — I5032 Chronic diastolic (congestive) heart failure: Secondary | ICD-10-CM | POA: Diagnosis not present

## 2021-06-17 DIAGNOSIS — I110 Hypertensive heart disease with heart failure: Secondary | ICD-10-CM | POA: Diagnosis not present

## 2021-06-17 DIAGNOSIS — I480 Paroxysmal atrial fibrillation: Secondary | ICD-10-CM | POA: Diagnosis not present

## 2021-06-17 DIAGNOSIS — G20 Parkinson's disease: Secondary | ICD-10-CM | POA: Diagnosis not present

## 2021-06-17 DIAGNOSIS — F320 Major depressive disorder, single episode, mild: Secondary | ICD-10-CM | POA: Diagnosis not present

## 2021-06-17 DIAGNOSIS — E230 Hypopituitarism: Secondary | ICD-10-CM | POA: Diagnosis not present

## 2021-06-21 DIAGNOSIS — G20 Parkinson's disease: Secondary | ICD-10-CM | POA: Diagnosis not present

## 2021-06-21 DIAGNOSIS — I110 Hypertensive heart disease with heart failure: Secondary | ICD-10-CM | POA: Diagnosis not present

## 2021-06-21 DIAGNOSIS — E230 Hypopituitarism: Secondary | ICD-10-CM | POA: Diagnosis not present

## 2021-06-21 DIAGNOSIS — I480 Paroxysmal atrial fibrillation: Secondary | ICD-10-CM | POA: Diagnosis not present

## 2021-06-21 DIAGNOSIS — I5032 Chronic diastolic (congestive) heart failure: Secondary | ICD-10-CM | POA: Diagnosis not present

## 2021-06-21 DIAGNOSIS — F320 Major depressive disorder, single episode, mild: Secondary | ICD-10-CM | POA: Diagnosis not present

## 2021-06-24 DIAGNOSIS — G20 Parkinson's disease: Secondary | ICD-10-CM | POA: Diagnosis not present

## 2021-06-24 DIAGNOSIS — I480 Paroxysmal atrial fibrillation: Secondary | ICD-10-CM | POA: Diagnosis not present

## 2021-06-24 DIAGNOSIS — I110 Hypertensive heart disease with heart failure: Secondary | ICD-10-CM | POA: Diagnosis not present

## 2021-06-24 DIAGNOSIS — I5032 Chronic diastolic (congestive) heart failure: Secondary | ICD-10-CM | POA: Diagnosis not present

## 2021-06-24 DIAGNOSIS — F320 Major depressive disorder, single episode, mild: Secondary | ICD-10-CM | POA: Diagnosis not present

## 2021-06-24 DIAGNOSIS — E230 Hypopituitarism: Secondary | ICD-10-CM | POA: Diagnosis not present

## 2021-06-28 DIAGNOSIS — I480 Paroxysmal atrial fibrillation: Secondary | ICD-10-CM | POA: Diagnosis not present

## 2021-06-28 DIAGNOSIS — I5032 Chronic diastolic (congestive) heart failure: Secondary | ICD-10-CM | POA: Diagnosis not present

## 2021-06-28 DIAGNOSIS — G20 Parkinson's disease: Secondary | ICD-10-CM | POA: Diagnosis not present

## 2021-06-28 DIAGNOSIS — I110 Hypertensive heart disease with heart failure: Secondary | ICD-10-CM | POA: Diagnosis not present

## 2021-06-28 DIAGNOSIS — E230 Hypopituitarism: Secondary | ICD-10-CM | POA: Diagnosis not present

## 2021-06-28 DIAGNOSIS — F320 Major depressive disorder, single episode, mild: Secondary | ICD-10-CM | POA: Diagnosis not present

## 2021-07-01 DIAGNOSIS — G20 Parkinson's disease: Secondary | ICD-10-CM | POA: Diagnosis not present

## 2021-07-01 DIAGNOSIS — I110 Hypertensive heart disease with heart failure: Secondary | ICD-10-CM | POA: Diagnosis not present

## 2021-07-01 DIAGNOSIS — E230 Hypopituitarism: Secondary | ICD-10-CM | POA: Diagnosis not present

## 2021-07-01 DIAGNOSIS — I5032 Chronic diastolic (congestive) heart failure: Secondary | ICD-10-CM | POA: Diagnosis not present

## 2021-07-01 DIAGNOSIS — I480 Paroxysmal atrial fibrillation: Secondary | ICD-10-CM | POA: Diagnosis not present

## 2021-07-01 DIAGNOSIS — F320 Major depressive disorder, single episode, mild: Secondary | ICD-10-CM | POA: Diagnosis not present

## 2021-07-07 DIAGNOSIS — F320 Major depressive disorder, single episode, mild: Secondary | ICD-10-CM | POA: Diagnosis not present

## 2021-07-07 DIAGNOSIS — I480 Paroxysmal atrial fibrillation: Secondary | ICD-10-CM | POA: Diagnosis not present

## 2021-07-07 DIAGNOSIS — I110 Hypertensive heart disease with heart failure: Secondary | ICD-10-CM | POA: Diagnosis not present

## 2021-07-07 DIAGNOSIS — I5032 Chronic diastolic (congestive) heart failure: Secondary | ICD-10-CM | POA: Diagnosis not present

## 2021-07-07 DIAGNOSIS — E230 Hypopituitarism: Secondary | ICD-10-CM | POA: Diagnosis not present

## 2021-07-07 DIAGNOSIS — G20 Parkinson's disease: Secondary | ICD-10-CM | POA: Diagnosis not present

## 2021-07-11 DIAGNOSIS — G20 Parkinson's disease: Secondary | ICD-10-CM | POA: Diagnosis not present

## 2021-07-11 DIAGNOSIS — F320 Major depressive disorder, single episode, mild: Secondary | ICD-10-CM | POA: Diagnosis not present

## 2021-07-11 DIAGNOSIS — I5032 Chronic diastolic (congestive) heart failure: Secondary | ICD-10-CM | POA: Diagnosis not present

## 2021-07-11 DIAGNOSIS — Z8546 Personal history of malignant neoplasm of prostate: Secondary | ICD-10-CM | POA: Diagnosis not present

## 2021-07-11 DIAGNOSIS — M5442 Lumbago with sciatica, left side: Secondary | ICD-10-CM | POA: Diagnosis not present

## 2021-07-11 DIAGNOSIS — E039 Hypothyroidism, unspecified: Secondary | ICD-10-CM | POA: Diagnosis not present

## 2021-07-11 DIAGNOSIS — M5441 Lumbago with sciatica, right side: Secondary | ICD-10-CM | POA: Diagnosis not present

## 2021-07-11 DIAGNOSIS — I110 Hypertensive heart disease with heart failure: Secondary | ICD-10-CM | POA: Diagnosis not present

## 2021-07-11 DIAGNOSIS — E230 Hypopituitarism: Secondary | ICD-10-CM | POA: Diagnosis not present

## 2021-07-11 DIAGNOSIS — Z66 Do not resuscitate: Secondary | ICD-10-CM | POA: Diagnosis not present

## 2021-07-11 DIAGNOSIS — I480 Paroxysmal atrial fibrillation: Secondary | ICD-10-CM | POA: Diagnosis not present

## 2021-07-13 DIAGNOSIS — F320 Major depressive disorder, single episode, mild: Secondary | ICD-10-CM | POA: Diagnosis not present

## 2021-07-13 DIAGNOSIS — E230 Hypopituitarism: Secondary | ICD-10-CM | POA: Diagnosis not present

## 2021-07-13 DIAGNOSIS — I480 Paroxysmal atrial fibrillation: Secondary | ICD-10-CM | POA: Diagnosis not present

## 2021-07-13 DIAGNOSIS — I5032 Chronic diastolic (congestive) heart failure: Secondary | ICD-10-CM | POA: Diagnosis not present

## 2021-07-13 DIAGNOSIS — G20 Parkinson's disease: Secondary | ICD-10-CM | POA: Diagnosis not present

## 2021-07-13 DIAGNOSIS — I110 Hypertensive heart disease with heart failure: Secondary | ICD-10-CM | POA: Diagnosis not present

## 2021-07-16 DIAGNOSIS — E230 Hypopituitarism: Secondary | ICD-10-CM | POA: Diagnosis not present

## 2021-07-16 DIAGNOSIS — I110 Hypertensive heart disease with heart failure: Secondary | ICD-10-CM | POA: Diagnosis not present

## 2021-07-16 DIAGNOSIS — F320 Major depressive disorder, single episode, mild: Secondary | ICD-10-CM | POA: Diagnosis not present

## 2021-07-16 DIAGNOSIS — I480 Paroxysmal atrial fibrillation: Secondary | ICD-10-CM | POA: Diagnosis not present

## 2021-07-16 DIAGNOSIS — I5032 Chronic diastolic (congestive) heart failure: Secondary | ICD-10-CM | POA: Diagnosis not present

## 2021-07-16 DIAGNOSIS — G20 Parkinson's disease: Secondary | ICD-10-CM | POA: Diagnosis not present

## 2021-07-19 DIAGNOSIS — I480 Paroxysmal atrial fibrillation: Secondary | ICD-10-CM | POA: Diagnosis not present

## 2021-07-19 DIAGNOSIS — G20 Parkinson's disease: Secondary | ICD-10-CM | POA: Diagnosis not present

## 2021-07-19 DIAGNOSIS — E230 Hypopituitarism: Secondary | ICD-10-CM | POA: Diagnosis not present

## 2021-07-19 DIAGNOSIS — I5032 Chronic diastolic (congestive) heart failure: Secondary | ICD-10-CM | POA: Diagnosis not present

## 2021-07-19 DIAGNOSIS — F320 Major depressive disorder, single episode, mild: Secondary | ICD-10-CM | POA: Diagnosis not present

## 2021-07-19 DIAGNOSIS — I110 Hypertensive heart disease with heart failure: Secondary | ICD-10-CM | POA: Diagnosis not present

## 2021-07-24 DIAGNOSIS — I480 Paroxysmal atrial fibrillation: Secondary | ICD-10-CM | POA: Diagnosis not present

## 2021-07-24 DIAGNOSIS — E230 Hypopituitarism: Secondary | ICD-10-CM | POA: Diagnosis not present

## 2021-07-24 DIAGNOSIS — I5032 Chronic diastolic (congestive) heart failure: Secondary | ICD-10-CM | POA: Diagnosis not present

## 2021-07-24 DIAGNOSIS — I110 Hypertensive heart disease with heart failure: Secondary | ICD-10-CM | POA: Diagnosis not present

## 2021-07-24 DIAGNOSIS — G20 Parkinson's disease: Secondary | ICD-10-CM | POA: Diagnosis not present

## 2021-07-24 DIAGNOSIS — F320 Major depressive disorder, single episode, mild: Secondary | ICD-10-CM | POA: Diagnosis not present

## 2021-07-27 DIAGNOSIS — E230 Hypopituitarism: Secondary | ICD-10-CM | POA: Diagnosis not present

## 2021-07-27 DIAGNOSIS — F320 Major depressive disorder, single episode, mild: Secondary | ICD-10-CM | POA: Diagnosis not present

## 2021-07-27 DIAGNOSIS — I480 Paroxysmal atrial fibrillation: Secondary | ICD-10-CM | POA: Diagnosis not present

## 2021-07-27 DIAGNOSIS — I5032 Chronic diastolic (congestive) heart failure: Secondary | ICD-10-CM | POA: Diagnosis not present

## 2021-07-27 DIAGNOSIS — I110 Hypertensive heart disease with heart failure: Secondary | ICD-10-CM | POA: Diagnosis not present

## 2021-07-27 DIAGNOSIS — G20 Parkinson's disease: Secondary | ICD-10-CM | POA: Diagnosis not present

## 2021-07-30 DIAGNOSIS — I5032 Chronic diastolic (congestive) heart failure: Secondary | ICD-10-CM | POA: Diagnosis not present

## 2021-07-30 DIAGNOSIS — I110 Hypertensive heart disease with heart failure: Secondary | ICD-10-CM | POA: Diagnosis not present

## 2021-07-30 DIAGNOSIS — E230 Hypopituitarism: Secondary | ICD-10-CM | POA: Diagnosis not present

## 2021-07-30 DIAGNOSIS — I480 Paroxysmal atrial fibrillation: Secondary | ICD-10-CM | POA: Diagnosis not present

## 2021-07-30 DIAGNOSIS — G20 Parkinson's disease: Secondary | ICD-10-CM | POA: Diagnosis not present

## 2021-07-30 DIAGNOSIS — F320 Major depressive disorder, single episode, mild: Secondary | ICD-10-CM | POA: Diagnosis not present

## 2021-08-03 DIAGNOSIS — G20 Parkinson's disease: Secondary | ICD-10-CM | POA: Diagnosis not present

## 2021-08-03 DIAGNOSIS — F320 Major depressive disorder, single episode, mild: Secondary | ICD-10-CM | POA: Diagnosis not present

## 2021-08-03 DIAGNOSIS — I480 Paroxysmal atrial fibrillation: Secondary | ICD-10-CM | POA: Diagnosis not present

## 2021-08-03 DIAGNOSIS — E230 Hypopituitarism: Secondary | ICD-10-CM | POA: Diagnosis not present

## 2021-08-03 DIAGNOSIS — I110 Hypertensive heart disease with heart failure: Secondary | ICD-10-CM | POA: Diagnosis not present

## 2021-08-03 DIAGNOSIS — I5032 Chronic diastolic (congestive) heart failure: Secondary | ICD-10-CM | POA: Diagnosis not present

## 2021-08-07 ENCOUNTER — Other Ambulatory Visit: Payer: Self-pay | Admitting: Endocrinology

## 2021-08-07 DIAGNOSIS — F320 Major depressive disorder, single episode, mild: Secondary | ICD-10-CM | POA: Diagnosis not present

## 2021-08-07 DIAGNOSIS — G20 Parkinson's disease: Secondary | ICD-10-CM | POA: Diagnosis not present

## 2021-08-07 DIAGNOSIS — I480 Paroxysmal atrial fibrillation: Secondary | ICD-10-CM | POA: Diagnosis not present

## 2021-08-07 DIAGNOSIS — E230 Hypopituitarism: Secondary | ICD-10-CM | POA: Diagnosis not present

## 2021-08-07 DIAGNOSIS — I5032 Chronic diastolic (congestive) heart failure: Secondary | ICD-10-CM | POA: Diagnosis not present

## 2021-08-07 DIAGNOSIS — I110 Hypertensive heart disease with heart failure: Secondary | ICD-10-CM | POA: Diagnosis not present

## 2021-08-09 DIAGNOSIS — I5032 Chronic diastolic (congestive) heart failure: Secondary | ICD-10-CM | POA: Diagnosis not present

## 2021-08-09 DIAGNOSIS — I110 Hypertensive heart disease with heart failure: Secondary | ICD-10-CM | POA: Diagnosis not present

## 2021-08-09 DIAGNOSIS — E230 Hypopituitarism: Secondary | ICD-10-CM | POA: Diagnosis not present

## 2021-08-09 DIAGNOSIS — G20 Parkinson's disease: Secondary | ICD-10-CM | POA: Diagnosis not present

## 2021-08-09 DIAGNOSIS — I480 Paroxysmal atrial fibrillation: Secondary | ICD-10-CM | POA: Diagnosis not present

## 2021-08-09 DIAGNOSIS — F320 Major depressive disorder, single episode, mild: Secondary | ICD-10-CM | POA: Diagnosis not present

## 2021-08-11 DIAGNOSIS — I480 Paroxysmal atrial fibrillation: Secondary | ICD-10-CM | POA: Diagnosis not present

## 2021-08-11 DIAGNOSIS — M5442 Lumbago with sciatica, left side: Secondary | ICD-10-CM | POA: Diagnosis not present

## 2021-08-11 DIAGNOSIS — M5441 Lumbago with sciatica, right side: Secondary | ICD-10-CM | POA: Diagnosis not present

## 2021-08-11 DIAGNOSIS — I5032 Chronic diastolic (congestive) heart failure: Secondary | ICD-10-CM | POA: Diagnosis not present

## 2021-08-11 DIAGNOSIS — Z66 Do not resuscitate: Secondary | ICD-10-CM | POA: Diagnosis not present

## 2021-08-11 DIAGNOSIS — E230 Hypopituitarism: Secondary | ICD-10-CM | POA: Diagnosis not present

## 2021-08-11 DIAGNOSIS — I110 Hypertensive heart disease with heart failure: Secondary | ICD-10-CM | POA: Diagnosis not present

## 2021-08-11 DIAGNOSIS — E039 Hypothyroidism, unspecified: Secondary | ICD-10-CM | POA: Diagnosis not present

## 2021-08-11 DIAGNOSIS — Z8546 Personal history of malignant neoplasm of prostate: Secondary | ICD-10-CM | POA: Diagnosis not present

## 2021-08-11 DIAGNOSIS — F320 Major depressive disorder, single episode, mild: Secondary | ICD-10-CM | POA: Diagnosis not present

## 2021-08-11 DIAGNOSIS — G20 Parkinson's disease: Secondary | ICD-10-CM | POA: Diagnosis not present

## 2021-08-12 ENCOUNTER — Other Ambulatory Visit: Payer: Self-pay | Admitting: Endocrinology

## 2021-08-13 DIAGNOSIS — I110 Hypertensive heart disease with heart failure: Secondary | ICD-10-CM | POA: Diagnosis not present

## 2021-08-13 DIAGNOSIS — F320 Major depressive disorder, single episode, mild: Secondary | ICD-10-CM | POA: Diagnosis not present

## 2021-08-13 DIAGNOSIS — E230 Hypopituitarism: Secondary | ICD-10-CM | POA: Diagnosis not present

## 2021-08-13 DIAGNOSIS — I5032 Chronic diastolic (congestive) heart failure: Secondary | ICD-10-CM | POA: Diagnosis not present

## 2021-08-13 DIAGNOSIS — G20 Parkinson's disease: Secondary | ICD-10-CM | POA: Diagnosis not present

## 2021-08-13 DIAGNOSIS — I480 Paroxysmal atrial fibrillation: Secondary | ICD-10-CM | POA: Diagnosis not present

## 2021-08-16 DIAGNOSIS — I5032 Chronic diastolic (congestive) heart failure: Secondary | ICD-10-CM | POA: Diagnosis not present

## 2021-08-16 DIAGNOSIS — E230 Hypopituitarism: Secondary | ICD-10-CM | POA: Diagnosis not present

## 2021-08-16 DIAGNOSIS — I110 Hypertensive heart disease with heart failure: Secondary | ICD-10-CM | POA: Diagnosis not present

## 2021-08-16 DIAGNOSIS — F320 Major depressive disorder, single episode, mild: Secondary | ICD-10-CM | POA: Diagnosis not present

## 2021-08-16 DIAGNOSIS — I480 Paroxysmal atrial fibrillation: Secondary | ICD-10-CM | POA: Diagnosis not present

## 2021-08-16 DIAGNOSIS — G20 Parkinson's disease: Secondary | ICD-10-CM | POA: Diagnosis not present

## 2021-08-21 DIAGNOSIS — I5032 Chronic diastolic (congestive) heart failure: Secondary | ICD-10-CM | POA: Diagnosis not present

## 2021-08-21 DIAGNOSIS — E230 Hypopituitarism: Secondary | ICD-10-CM | POA: Diagnosis not present

## 2021-08-21 DIAGNOSIS — G20 Parkinson's disease: Secondary | ICD-10-CM | POA: Diagnosis not present

## 2021-08-21 DIAGNOSIS — F320 Major depressive disorder, single episode, mild: Secondary | ICD-10-CM | POA: Diagnosis not present

## 2021-08-21 DIAGNOSIS — I110 Hypertensive heart disease with heart failure: Secondary | ICD-10-CM | POA: Diagnosis not present

## 2021-08-21 DIAGNOSIS — I480 Paroxysmal atrial fibrillation: Secondary | ICD-10-CM | POA: Diagnosis not present

## 2021-08-23 DIAGNOSIS — G20 Parkinson's disease: Secondary | ICD-10-CM | POA: Diagnosis not present

## 2021-08-23 DIAGNOSIS — I480 Paroxysmal atrial fibrillation: Secondary | ICD-10-CM | POA: Diagnosis not present

## 2021-08-23 DIAGNOSIS — I5032 Chronic diastolic (congestive) heart failure: Secondary | ICD-10-CM | POA: Diagnosis not present

## 2021-08-23 DIAGNOSIS — I110 Hypertensive heart disease with heart failure: Secondary | ICD-10-CM | POA: Diagnosis not present

## 2021-08-23 DIAGNOSIS — E230 Hypopituitarism: Secondary | ICD-10-CM | POA: Diagnosis not present

## 2021-08-23 DIAGNOSIS — F320 Major depressive disorder, single episode, mild: Secondary | ICD-10-CM | POA: Diagnosis not present

## 2021-08-26 DIAGNOSIS — E230 Hypopituitarism: Secondary | ICD-10-CM | POA: Diagnosis not present

## 2021-08-26 DIAGNOSIS — I110 Hypertensive heart disease with heart failure: Secondary | ICD-10-CM | POA: Diagnosis not present

## 2021-08-26 DIAGNOSIS — G20 Parkinson's disease: Secondary | ICD-10-CM | POA: Diagnosis not present

## 2021-08-26 DIAGNOSIS — F320 Major depressive disorder, single episode, mild: Secondary | ICD-10-CM | POA: Diagnosis not present

## 2021-08-26 DIAGNOSIS — I480 Paroxysmal atrial fibrillation: Secondary | ICD-10-CM | POA: Diagnosis not present

## 2021-08-26 DIAGNOSIS — I5032 Chronic diastolic (congestive) heart failure: Secondary | ICD-10-CM | POA: Diagnosis not present

## 2021-09-02 DIAGNOSIS — I5032 Chronic diastolic (congestive) heart failure: Secondary | ICD-10-CM | POA: Diagnosis not present

## 2021-09-02 DIAGNOSIS — I480 Paroxysmal atrial fibrillation: Secondary | ICD-10-CM | POA: Diagnosis not present

## 2021-09-02 DIAGNOSIS — E230 Hypopituitarism: Secondary | ICD-10-CM | POA: Diagnosis not present

## 2021-09-02 DIAGNOSIS — I110 Hypertensive heart disease with heart failure: Secondary | ICD-10-CM | POA: Diagnosis not present

## 2021-09-02 DIAGNOSIS — G20 Parkinson's disease: Secondary | ICD-10-CM | POA: Diagnosis not present

## 2021-09-02 DIAGNOSIS — F320 Major depressive disorder, single episode, mild: Secondary | ICD-10-CM | POA: Diagnosis not present

## 2021-09-07 DIAGNOSIS — E230 Hypopituitarism: Secondary | ICD-10-CM | POA: Diagnosis not present

## 2021-09-07 DIAGNOSIS — I5032 Chronic diastolic (congestive) heart failure: Secondary | ICD-10-CM | POA: Diagnosis not present

## 2021-09-07 DIAGNOSIS — F320 Major depressive disorder, single episode, mild: Secondary | ICD-10-CM | POA: Diagnosis not present

## 2021-09-07 DIAGNOSIS — I480 Paroxysmal atrial fibrillation: Secondary | ICD-10-CM | POA: Diagnosis not present

## 2021-09-07 DIAGNOSIS — I110 Hypertensive heart disease with heart failure: Secondary | ICD-10-CM | POA: Diagnosis not present

## 2021-09-07 DIAGNOSIS — G20 Parkinson's disease: Secondary | ICD-10-CM | POA: Diagnosis not present

## 2021-09-08 DIAGNOSIS — Z66 Do not resuscitate: Secondary | ICD-10-CM | POA: Diagnosis not present

## 2021-09-08 DIAGNOSIS — F320 Major depressive disorder, single episode, mild: Secondary | ICD-10-CM | POA: Diagnosis not present

## 2021-09-08 DIAGNOSIS — M5442 Lumbago with sciatica, left side: Secondary | ICD-10-CM | POA: Diagnosis not present

## 2021-09-08 DIAGNOSIS — M5441 Lumbago with sciatica, right side: Secondary | ICD-10-CM | POA: Diagnosis not present

## 2021-09-08 DIAGNOSIS — I5032 Chronic diastolic (congestive) heart failure: Secondary | ICD-10-CM | POA: Diagnosis not present

## 2021-09-08 DIAGNOSIS — E230 Hypopituitarism: Secondary | ICD-10-CM | POA: Diagnosis not present

## 2021-09-08 DIAGNOSIS — G20 Parkinson's disease: Secondary | ICD-10-CM | POA: Diagnosis not present

## 2021-09-08 DIAGNOSIS — Z8546 Personal history of malignant neoplasm of prostate: Secondary | ICD-10-CM | POA: Diagnosis not present

## 2021-09-08 DIAGNOSIS — I110 Hypertensive heart disease with heart failure: Secondary | ICD-10-CM | POA: Diagnosis not present

## 2021-09-08 DIAGNOSIS — E039 Hypothyroidism, unspecified: Secondary | ICD-10-CM | POA: Diagnosis not present

## 2021-09-08 DIAGNOSIS — I480 Paroxysmal atrial fibrillation: Secondary | ICD-10-CM | POA: Diagnosis not present

## 2021-09-10 DIAGNOSIS — I5032 Chronic diastolic (congestive) heart failure: Secondary | ICD-10-CM | POA: Diagnosis not present

## 2021-09-10 DIAGNOSIS — I480 Paroxysmal atrial fibrillation: Secondary | ICD-10-CM | POA: Diagnosis not present

## 2021-09-10 DIAGNOSIS — I110 Hypertensive heart disease with heart failure: Secondary | ICD-10-CM | POA: Diagnosis not present

## 2021-09-10 DIAGNOSIS — F320 Major depressive disorder, single episode, mild: Secondary | ICD-10-CM | POA: Diagnosis not present

## 2021-09-10 DIAGNOSIS — E230 Hypopituitarism: Secondary | ICD-10-CM | POA: Diagnosis not present

## 2021-09-10 DIAGNOSIS — G20 Parkinson's disease: Secondary | ICD-10-CM | POA: Diagnosis not present

## 2021-09-13 DIAGNOSIS — I480 Paroxysmal atrial fibrillation: Secondary | ICD-10-CM | POA: Diagnosis not present

## 2021-09-13 DIAGNOSIS — F320 Major depressive disorder, single episode, mild: Secondary | ICD-10-CM | POA: Diagnosis not present

## 2021-09-13 DIAGNOSIS — I110 Hypertensive heart disease with heart failure: Secondary | ICD-10-CM | POA: Diagnosis not present

## 2021-09-13 DIAGNOSIS — E230 Hypopituitarism: Secondary | ICD-10-CM | POA: Diagnosis not present

## 2021-09-13 DIAGNOSIS — I5032 Chronic diastolic (congestive) heart failure: Secondary | ICD-10-CM | POA: Diagnosis not present

## 2021-09-13 DIAGNOSIS — G20 Parkinson's disease: Secondary | ICD-10-CM | POA: Diagnosis not present

## 2021-09-16 DIAGNOSIS — I480 Paroxysmal atrial fibrillation: Secondary | ICD-10-CM | POA: Diagnosis not present

## 2021-09-16 DIAGNOSIS — I5032 Chronic diastolic (congestive) heart failure: Secondary | ICD-10-CM | POA: Diagnosis not present

## 2021-09-16 DIAGNOSIS — F320 Major depressive disorder, single episode, mild: Secondary | ICD-10-CM | POA: Diagnosis not present

## 2021-09-16 DIAGNOSIS — G20 Parkinson's disease: Secondary | ICD-10-CM | POA: Diagnosis not present

## 2021-09-16 DIAGNOSIS — I110 Hypertensive heart disease with heart failure: Secondary | ICD-10-CM | POA: Diagnosis not present

## 2021-09-16 DIAGNOSIS — E230 Hypopituitarism: Secondary | ICD-10-CM | POA: Diagnosis not present

## 2021-09-21 DIAGNOSIS — Z20822 Contact with and (suspected) exposure to covid-19: Secondary | ICD-10-CM | POA: Diagnosis not present

## 2021-09-22 DIAGNOSIS — I480 Paroxysmal atrial fibrillation: Secondary | ICD-10-CM | POA: Diagnosis not present

## 2021-09-22 DIAGNOSIS — I5032 Chronic diastolic (congestive) heart failure: Secondary | ICD-10-CM | POA: Diagnosis not present

## 2021-09-22 DIAGNOSIS — E230 Hypopituitarism: Secondary | ICD-10-CM | POA: Diagnosis not present

## 2021-09-22 DIAGNOSIS — I110 Hypertensive heart disease with heart failure: Secondary | ICD-10-CM | POA: Diagnosis not present

## 2021-09-22 DIAGNOSIS — F320 Major depressive disorder, single episode, mild: Secondary | ICD-10-CM | POA: Diagnosis not present

## 2021-09-22 DIAGNOSIS — G20 Parkinson's disease: Secondary | ICD-10-CM | POA: Diagnosis not present

## 2021-09-24 DIAGNOSIS — F320 Major depressive disorder, single episode, mild: Secondary | ICD-10-CM | POA: Diagnosis not present

## 2021-09-24 DIAGNOSIS — E230 Hypopituitarism: Secondary | ICD-10-CM | POA: Diagnosis not present

## 2021-09-24 DIAGNOSIS — I110 Hypertensive heart disease with heart failure: Secondary | ICD-10-CM | POA: Diagnosis not present

## 2021-09-24 DIAGNOSIS — G20 Parkinson's disease: Secondary | ICD-10-CM | POA: Diagnosis not present

## 2021-09-24 DIAGNOSIS — I5032 Chronic diastolic (congestive) heart failure: Secondary | ICD-10-CM | POA: Diagnosis not present

## 2021-09-24 DIAGNOSIS — I480 Paroxysmal atrial fibrillation: Secondary | ICD-10-CM | POA: Diagnosis not present

## 2021-09-28 DIAGNOSIS — I480 Paroxysmal atrial fibrillation: Secondary | ICD-10-CM | POA: Diagnosis not present

## 2021-09-28 DIAGNOSIS — F320 Major depressive disorder, single episode, mild: Secondary | ICD-10-CM | POA: Diagnosis not present

## 2021-09-28 DIAGNOSIS — I5032 Chronic diastolic (congestive) heart failure: Secondary | ICD-10-CM | POA: Diagnosis not present

## 2021-09-28 DIAGNOSIS — G20 Parkinson's disease: Secondary | ICD-10-CM | POA: Diagnosis not present

## 2021-09-28 DIAGNOSIS — E230 Hypopituitarism: Secondary | ICD-10-CM | POA: Diagnosis not present

## 2021-09-28 DIAGNOSIS — I110 Hypertensive heart disease with heart failure: Secondary | ICD-10-CM | POA: Diagnosis not present

## 2021-10-01 DIAGNOSIS — I5032 Chronic diastolic (congestive) heart failure: Secondary | ICD-10-CM | POA: Diagnosis not present

## 2021-10-01 DIAGNOSIS — F320 Major depressive disorder, single episode, mild: Secondary | ICD-10-CM | POA: Diagnosis not present

## 2021-10-01 DIAGNOSIS — G20 Parkinson's disease: Secondary | ICD-10-CM | POA: Diagnosis not present

## 2021-10-01 DIAGNOSIS — E230 Hypopituitarism: Secondary | ICD-10-CM | POA: Diagnosis not present

## 2021-10-01 DIAGNOSIS — I480 Paroxysmal atrial fibrillation: Secondary | ICD-10-CM | POA: Diagnosis not present

## 2021-10-01 DIAGNOSIS — I110 Hypertensive heart disease with heart failure: Secondary | ICD-10-CM | POA: Diagnosis not present

## 2021-10-05 DIAGNOSIS — F320 Major depressive disorder, single episode, mild: Secondary | ICD-10-CM | POA: Diagnosis not present

## 2021-10-05 DIAGNOSIS — G20 Parkinson's disease: Secondary | ICD-10-CM | POA: Diagnosis not present

## 2021-10-05 DIAGNOSIS — E230 Hypopituitarism: Secondary | ICD-10-CM | POA: Diagnosis not present

## 2021-10-05 DIAGNOSIS — I5032 Chronic diastolic (congestive) heart failure: Secondary | ICD-10-CM | POA: Diagnosis not present

## 2021-10-05 DIAGNOSIS — I110 Hypertensive heart disease with heart failure: Secondary | ICD-10-CM | POA: Diagnosis not present

## 2021-10-05 DIAGNOSIS — I480 Paroxysmal atrial fibrillation: Secondary | ICD-10-CM | POA: Diagnosis not present

## 2021-10-07 DIAGNOSIS — I480 Paroxysmal atrial fibrillation: Secondary | ICD-10-CM | POA: Diagnosis not present

## 2021-10-07 DIAGNOSIS — I110 Hypertensive heart disease with heart failure: Secondary | ICD-10-CM | POA: Diagnosis not present

## 2021-10-07 DIAGNOSIS — G20 Parkinson's disease: Secondary | ICD-10-CM | POA: Diagnosis not present

## 2021-10-07 DIAGNOSIS — E230 Hypopituitarism: Secondary | ICD-10-CM | POA: Diagnosis not present

## 2021-10-07 DIAGNOSIS — F320 Major depressive disorder, single episode, mild: Secondary | ICD-10-CM | POA: Diagnosis not present

## 2021-10-07 DIAGNOSIS — I5032 Chronic diastolic (congestive) heart failure: Secondary | ICD-10-CM | POA: Diagnosis not present

## 2021-10-09 DIAGNOSIS — E039 Hypothyroidism, unspecified: Secondary | ICD-10-CM | POA: Diagnosis not present

## 2021-10-09 DIAGNOSIS — Z66 Do not resuscitate: Secondary | ICD-10-CM | POA: Diagnosis not present

## 2021-10-09 DIAGNOSIS — M5441 Lumbago with sciatica, right side: Secondary | ICD-10-CM | POA: Diagnosis not present

## 2021-10-09 DIAGNOSIS — G20 Parkinson's disease: Secondary | ICD-10-CM | POA: Diagnosis not present

## 2021-10-09 DIAGNOSIS — I5032 Chronic diastolic (congestive) heart failure: Secondary | ICD-10-CM | POA: Diagnosis not present

## 2021-10-09 DIAGNOSIS — E230 Hypopituitarism: Secondary | ICD-10-CM | POA: Diagnosis not present

## 2021-10-09 DIAGNOSIS — Z8546 Personal history of malignant neoplasm of prostate: Secondary | ICD-10-CM | POA: Diagnosis not present

## 2021-10-09 DIAGNOSIS — F320 Major depressive disorder, single episode, mild: Secondary | ICD-10-CM | POA: Diagnosis not present

## 2021-10-09 DIAGNOSIS — I480 Paroxysmal atrial fibrillation: Secondary | ICD-10-CM | POA: Diagnosis not present

## 2021-10-09 DIAGNOSIS — I110 Hypertensive heart disease with heart failure: Secondary | ICD-10-CM | POA: Diagnosis not present

## 2021-10-09 DIAGNOSIS — M5442 Lumbago with sciatica, left side: Secondary | ICD-10-CM | POA: Diagnosis not present

## 2021-10-12 DIAGNOSIS — E230 Hypopituitarism: Secondary | ICD-10-CM | POA: Diagnosis not present

## 2021-10-12 DIAGNOSIS — F320 Major depressive disorder, single episode, mild: Secondary | ICD-10-CM | POA: Diagnosis not present

## 2021-10-12 DIAGNOSIS — G20 Parkinson's disease: Secondary | ICD-10-CM | POA: Diagnosis not present

## 2021-10-12 DIAGNOSIS — I5032 Chronic diastolic (congestive) heart failure: Secondary | ICD-10-CM | POA: Diagnosis not present

## 2021-10-12 DIAGNOSIS — I110 Hypertensive heart disease with heart failure: Secondary | ICD-10-CM | POA: Diagnosis not present

## 2021-10-12 DIAGNOSIS — I480 Paroxysmal atrial fibrillation: Secondary | ICD-10-CM | POA: Diagnosis not present

## 2021-10-14 DIAGNOSIS — I5032 Chronic diastolic (congestive) heart failure: Secondary | ICD-10-CM | POA: Diagnosis not present

## 2021-10-14 DIAGNOSIS — F320 Major depressive disorder, single episode, mild: Secondary | ICD-10-CM | POA: Diagnosis not present

## 2021-10-14 DIAGNOSIS — I480 Paroxysmal atrial fibrillation: Secondary | ICD-10-CM | POA: Diagnosis not present

## 2021-10-14 DIAGNOSIS — G20 Parkinson's disease: Secondary | ICD-10-CM | POA: Diagnosis not present

## 2021-10-14 DIAGNOSIS — E230 Hypopituitarism: Secondary | ICD-10-CM | POA: Diagnosis not present

## 2021-10-14 DIAGNOSIS — I110 Hypertensive heart disease with heart failure: Secondary | ICD-10-CM | POA: Diagnosis not present

## 2021-10-27 DIAGNOSIS — I480 Paroxysmal atrial fibrillation: Secondary | ICD-10-CM | POA: Diagnosis not present

## 2021-10-27 DIAGNOSIS — F320 Major depressive disorder, single episode, mild: Secondary | ICD-10-CM | POA: Diagnosis not present

## 2021-10-27 DIAGNOSIS — E230 Hypopituitarism: Secondary | ICD-10-CM | POA: Diagnosis not present

## 2021-10-27 DIAGNOSIS — G20 Parkinson's disease: Secondary | ICD-10-CM | POA: Diagnosis not present

## 2021-10-27 DIAGNOSIS — I110 Hypertensive heart disease with heart failure: Secondary | ICD-10-CM | POA: Diagnosis not present

## 2021-10-27 DIAGNOSIS — I5032 Chronic diastolic (congestive) heart failure: Secondary | ICD-10-CM | POA: Diagnosis not present

## 2021-11-02 DIAGNOSIS — F320 Major depressive disorder, single episode, mild: Secondary | ICD-10-CM | POA: Diagnosis not present

## 2021-11-02 DIAGNOSIS — I480 Paroxysmal atrial fibrillation: Secondary | ICD-10-CM | POA: Diagnosis not present

## 2021-11-02 DIAGNOSIS — G20 Parkinson's disease: Secondary | ICD-10-CM | POA: Diagnosis not present

## 2021-11-02 DIAGNOSIS — E230 Hypopituitarism: Secondary | ICD-10-CM | POA: Diagnosis not present

## 2021-11-02 DIAGNOSIS — I110 Hypertensive heart disease with heart failure: Secondary | ICD-10-CM | POA: Diagnosis not present

## 2021-11-02 DIAGNOSIS — I5032 Chronic diastolic (congestive) heart failure: Secondary | ICD-10-CM | POA: Diagnosis not present

## 2021-11-04 DIAGNOSIS — E230 Hypopituitarism: Secondary | ICD-10-CM | POA: Diagnosis not present

## 2021-11-04 DIAGNOSIS — G20 Parkinson's disease: Secondary | ICD-10-CM | POA: Diagnosis not present

## 2021-11-04 DIAGNOSIS — I110 Hypertensive heart disease with heart failure: Secondary | ICD-10-CM | POA: Diagnosis not present

## 2021-11-04 DIAGNOSIS — I5032 Chronic diastolic (congestive) heart failure: Secondary | ICD-10-CM | POA: Diagnosis not present

## 2021-11-04 DIAGNOSIS — I480 Paroxysmal atrial fibrillation: Secondary | ICD-10-CM | POA: Diagnosis not present

## 2021-11-04 DIAGNOSIS — F320 Major depressive disorder, single episode, mild: Secondary | ICD-10-CM | POA: Diagnosis not present

## 2021-11-06 DIAGNOSIS — I5032 Chronic diastolic (congestive) heart failure: Secondary | ICD-10-CM | POA: Diagnosis not present

## 2021-11-06 DIAGNOSIS — I480 Paroxysmal atrial fibrillation: Secondary | ICD-10-CM | POA: Diagnosis not present

## 2021-11-06 DIAGNOSIS — I110 Hypertensive heart disease with heart failure: Secondary | ICD-10-CM | POA: Diagnosis not present

## 2021-11-06 DIAGNOSIS — F320 Major depressive disorder, single episode, mild: Secondary | ICD-10-CM | POA: Diagnosis not present

## 2021-11-06 DIAGNOSIS — E230 Hypopituitarism: Secondary | ICD-10-CM | POA: Diagnosis not present

## 2021-11-06 DIAGNOSIS — G20 Parkinson's disease: Secondary | ICD-10-CM | POA: Diagnosis not present

## 2021-11-08 DIAGNOSIS — G20 Parkinson's disease: Secondary | ICD-10-CM | POA: Diagnosis not present

## 2021-11-08 DIAGNOSIS — I5032 Chronic diastolic (congestive) heart failure: Secondary | ICD-10-CM | POA: Diagnosis not present

## 2021-11-08 DIAGNOSIS — Z66 Do not resuscitate: Secondary | ICD-10-CM | POA: Diagnosis not present

## 2021-11-08 DIAGNOSIS — M5441 Lumbago with sciatica, right side: Secondary | ICD-10-CM | POA: Diagnosis not present

## 2021-11-08 DIAGNOSIS — Z8546 Personal history of malignant neoplasm of prostate: Secondary | ICD-10-CM | POA: Diagnosis not present

## 2021-11-08 DIAGNOSIS — I110 Hypertensive heart disease with heart failure: Secondary | ICD-10-CM | POA: Diagnosis not present

## 2021-11-08 DIAGNOSIS — E039 Hypothyroidism, unspecified: Secondary | ICD-10-CM | POA: Diagnosis not present

## 2021-11-08 DIAGNOSIS — F320 Major depressive disorder, single episode, mild: Secondary | ICD-10-CM | POA: Diagnosis not present

## 2021-11-08 DIAGNOSIS — I480 Paroxysmal atrial fibrillation: Secondary | ICD-10-CM | POA: Diagnosis not present

## 2021-11-08 DIAGNOSIS — M5442 Lumbago with sciatica, left side: Secondary | ICD-10-CM | POA: Diagnosis not present

## 2021-11-08 DIAGNOSIS — E230 Hypopituitarism: Secondary | ICD-10-CM | POA: Diagnosis not present

## 2021-11-12 DIAGNOSIS — F320 Major depressive disorder, single episode, mild: Secondary | ICD-10-CM | POA: Diagnosis not present

## 2021-11-12 DIAGNOSIS — I480 Paroxysmal atrial fibrillation: Secondary | ICD-10-CM | POA: Diagnosis not present

## 2021-11-12 DIAGNOSIS — I5032 Chronic diastolic (congestive) heart failure: Secondary | ICD-10-CM | POA: Diagnosis not present

## 2021-11-12 DIAGNOSIS — G20 Parkinson's disease: Secondary | ICD-10-CM | POA: Diagnosis not present

## 2021-11-12 DIAGNOSIS — I110 Hypertensive heart disease with heart failure: Secondary | ICD-10-CM | POA: Diagnosis not present

## 2021-11-12 DIAGNOSIS — E230 Hypopituitarism: Secondary | ICD-10-CM | POA: Diagnosis not present

## 2021-11-15 DIAGNOSIS — Z20822 Contact with and (suspected) exposure to covid-19: Secondary | ICD-10-CM | POA: Diagnosis not present

## 2021-11-16 DIAGNOSIS — E230 Hypopituitarism: Secondary | ICD-10-CM | POA: Diagnosis not present

## 2021-11-16 DIAGNOSIS — I5032 Chronic diastolic (congestive) heart failure: Secondary | ICD-10-CM | POA: Diagnosis not present

## 2021-11-16 DIAGNOSIS — I110 Hypertensive heart disease with heart failure: Secondary | ICD-10-CM | POA: Diagnosis not present

## 2021-11-16 DIAGNOSIS — G20 Parkinson's disease: Secondary | ICD-10-CM | POA: Diagnosis not present

## 2021-11-16 DIAGNOSIS — F320 Major depressive disorder, single episode, mild: Secondary | ICD-10-CM | POA: Diagnosis not present

## 2021-11-16 DIAGNOSIS — I480 Paroxysmal atrial fibrillation: Secondary | ICD-10-CM | POA: Diagnosis not present

## 2021-11-19 DIAGNOSIS — I110 Hypertensive heart disease with heart failure: Secondary | ICD-10-CM | POA: Diagnosis not present

## 2021-11-19 DIAGNOSIS — E230 Hypopituitarism: Secondary | ICD-10-CM | POA: Diagnosis not present

## 2021-11-19 DIAGNOSIS — I5032 Chronic diastolic (congestive) heart failure: Secondary | ICD-10-CM | POA: Diagnosis not present

## 2021-11-19 DIAGNOSIS — G20 Parkinson's disease: Secondary | ICD-10-CM | POA: Diagnosis not present

## 2021-11-19 DIAGNOSIS — F320 Major depressive disorder, single episode, mild: Secondary | ICD-10-CM | POA: Diagnosis not present

## 2021-11-19 DIAGNOSIS — I480 Paroxysmal atrial fibrillation: Secondary | ICD-10-CM | POA: Diagnosis not present

## 2021-11-24 DIAGNOSIS — I5032 Chronic diastolic (congestive) heart failure: Secondary | ICD-10-CM | POA: Diagnosis not present

## 2021-11-24 DIAGNOSIS — I110 Hypertensive heart disease with heart failure: Secondary | ICD-10-CM | POA: Diagnosis not present

## 2021-11-24 DIAGNOSIS — E230 Hypopituitarism: Secondary | ICD-10-CM | POA: Diagnosis not present

## 2021-11-24 DIAGNOSIS — I480 Paroxysmal atrial fibrillation: Secondary | ICD-10-CM | POA: Diagnosis not present

## 2021-11-24 DIAGNOSIS — G20 Parkinson's disease: Secondary | ICD-10-CM | POA: Diagnosis not present

## 2021-11-24 DIAGNOSIS — F320 Major depressive disorder, single episode, mild: Secondary | ICD-10-CM | POA: Diagnosis not present

## 2021-11-26 DIAGNOSIS — I480 Paroxysmal atrial fibrillation: Secondary | ICD-10-CM | POA: Diagnosis not present

## 2021-11-26 DIAGNOSIS — E230 Hypopituitarism: Secondary | ICD-10-CM | POA: Diagnosis not present

## 2021-11-26 DIAGNOSIS — I110 Hypertensive heart disease with heart failure: Secondary | ICD-10-CM | POA: Diagnosis not present

## 2021-11-26 DIAGNOSIS — G20 Parkinson's disease: Secondary | ICD-10-CM | POA: Diagnosis not present

## 2021-11-26 DIAGNOSIS — I5032 Chronic diastolic (congestive) heart failure: Secondary | ICD-10-CM | POA: Diagnosis not present

## 2021-11-26 DIAGNOSIS — F320 Major depressive disorder, single episode, mild: Secondary | ICD-10-CM | POA: Diagnosis not present

## 2021-11-30 DIAGNOSIS — I480 Paroxysmal atrial fibrillation: Secondary | ICD-10-CM | POA: Diagnosis not present

## 2021-11-30 DIAGNOSIS — F320 Major depressive disorder, single episode, mild: Secondary | ICD-10-CM | POA: Diagnosis not present

## 2021-11-30 DIAGNOSIS — I110 Hypertensive heart disease with heart failure: Secondary | ICD-10-CM | POA: Diagnosis not present

## 2021-11-30 DIAGNOSIS — G20 Parkinson's disease: Secondary | ICD-10-CM | POA: Diagnosis not present

## 2021-11-30 DIAGNOSIS — E230 Hypopituitarism: Secondary | ICD-10-CM | POA: Diagnosis not present

## 2021-11-30 DIAGNOSIS — I5032 Chronic diastolic (congestive) heart failure: Secondary | ICD-10-CM | POA: Diagnosis not present

## 2021-12-02 DIAGNOSIS — E230 Hypopituitarism: Secondary | ICD-10-CM | POA: Diagnosis not present

## 2021-12-02 DIAGNOSIS — I5032 Chronic diastolic (congestive) heart failure: Secondary | ICD-10-CM | POA: Diagnosis not present

## 2021-12-02 DIAGNOSIS — I110 Hypertensive heart disease with heart failure: Secondary | ICD-10-CM | POA: Diagnosis not present

## 2021-12-02 DIAGNOSIS — G20 Parkinson's disease: Secondary | ICD-10-CM | POA: Diagnosis not present

## 2021-12-02 DIAGNOSIS — F320 Major depressive disorder, single episode, mild: Secondary | ICD-10-CM | POA: Diagnosis not present

## 2021-12-02 DIAGNOSIS — I480 Paroxysmal atrial fibrillation: Secondary | ICD-10-CM | POA: Diagnosis not present

## 2021-12-07 DIAGNOSIS — F320 Major depressive disorder, single episode, mild: Secondary | ICD-10-CM | POA: Diagnosis not present

## 2021-12-07 DIAGNOSIS — G20 Parkinson's disease: Secondary | ICD-10-CM | POA: Diagnosis not present

## 2021-12-07 DIAGNOSIS — I110 Hypertensive heart disease with heart failure: Secondary | ICD-10-CM | POA: Diagnosis not present

## 2021-12-07 DIAGNOSIS — I5032 Chronic diastolic (congestive) heart failure: Secondary | ICD-10-CM | POA: Diagnosis not present

## 2021-12-07 DIAGNOSIS — I480 Paroxysmal atrial fibrillation: Secondary | ICD-10-CM | POA: Diagnosis not present

## 2021-12-07 DIAGNOSIS — E230 Hypopituitarism: Secondary | ICD-10-CM | POA: Diagnosis not present

## 2021-12-08 DIAGNOSIS — G20 Parkinson's disease: Secondary | ICD-10-CM | POA: Diagnosis not present

## 2021-12-08 DIAGNOSIS — E230 Hypopituitarism: Secondary | ICD-10-CM | POA: Diagnosis not present

## 2021-12-08 DIAGNOSIS — I110 Hypertensive heart disease with heart failure: Secondary | ICD-10-CM | POA: Diagnosis not present

## 2021-12-08 DIAGNOSIS — I480 Paroxysmal atrial fibrillation: Secondary | ICD-10-CM | POA: Diagnosis not present

## 2021-12-08 DIAGNOSIS — I5032 Chronic diastolic (congestive) heart failure: Secondary | ICD-10-CM | POA: Diagnosis not present

## 2021-12-08 DIAGNOSIS — F320 Major depressive disorder, single episode, mild: Secondary | ICD-10-CM | POA: Diagnosis not present

## 2021-12-09 DIAGNOSIS — Z8546 Personal history of malignant neoplasm of prostate: Secondary | ICD-10-CM | POA: Diagnosis not present

## 2021-12-09 DIAGNOSIS — E039 Hypothyroidism, unspecified: Secondary | ICD-10-CM | POA: Diagnosis not present

## 2021-12-09 DIAGNOSIS — M5441 Lumbago with sciatica, right side: Secondary | ICD-10-CM | POA: Diagnosis not present

## 2021-12-09 DIAGNOSIS — E230 Hypopituitarism: Secondary | ICD-10-CM | POA: Diagnosis not present

## 2021-12-09 DIAGNOSIS — G20 Parkinson's disease: Secondary | ICD-10-CM | POA: Diagnosis not present

## 2021-12-09 DIAGNOSIS — F320 Major depressive disorder, single episode, mild: Secondary | ICD-10-CM | POA: Diagnosis not present

## 2021-12-09 DIAGNOSIS — I110 Hypertensive heart disease with heart failure: Secondary | ICD-10-CM | POA: Diagnosis not present

## 2021-12-09 DIAGNOSIS — M5442 Lumbago with sciatica, left side: Secondary | ICD-10-CM | POA: Diagnosis not present

## 2021-12-09 DIAGNOSIS — Z66 Do not resuscitate: Secondary | ICD-10-CM | POA: Diagnosis not present

## 2021-12-09 DIAGNOSIS — M1000 Idiopathic gout, unspecified site: Secondary | ICD-10-CM | POA: Diagnosis not present

## 2021-12-09 DIAGNOSIS — I5032 Chronic diastolic (congestive) heart failure: Secondary | ICD-10-CM | POA: Diagnosis not present

## 2021-12-09 DIAGNOSIS — I480 Paroxysmal atrial fibrillation: Secondary | ICD-10-CM | POA: Diagnosis not present

## 2021-12-13 DIAGNOSIS — I5032 Chronic diastolic (congestive) heart failure: Secondary | ICD-10-CM | POA: Diagnosis not present

## 2021-12-13 DIAGNOSIS — I480 Paroxysmal atrial fibrillation: Secondary | ICD-10-CM | POA: Diagnosis not present

## 2021-12-13 DIAGNOSIS — I110 Hypertensive heart disease with heart failure: Secondary | ICD-10-CM | POA: Diagnosis not present

## 2021-12-13 DIAGNOSIS — G20 Parkinson's disease: Secondary | ICD-10-CM | POA: Diagnosis not present

## 2021-12-13 DIAGNOSIS — F320 Major depressive disorder, single episode, mild: Secondary | ICD-10-CM | POA: Diagnosis not present

## 2021-12-13 DIAGNOSIS — E230 Hypopituitarism: Secondary | ICD-10-CM | POA: Diagnosis not present

## 2021-12-16 DIAGNOSIS — E230 Hypopituitarism: Secondary | ICD-10-CM | POA: Diagnosis not present

## 2021-12-16 DIAGNOSIS — F320 Major depressive disorder, single episode, mild: Secondary | ICD-10-CM | POA: Diagnosis not present

## 2021-12-16 DIAGNOSIS — I480 Paroxysmal atrial fibrillation: Secondary | ICD-10-CM | POA: Diagnosis not present

## 2021-12-16 DIAGNOSIS — I5032 Chronic diastolic (congestive) heart failure: Secondary | ICD-10-CM | POA: Diagnosis not present

## 2021-12-16 DIAGNOSIS — G20 Parkinson's disease: Secondary | ICD-10-CM | POA: Diagnosis not present

## 2021-12-16 DIAGNOSIS — I110 Hypertensive heart disease with heart failure: Secondary | ICD-10-CM | POA: Diagnosis not present

## 2021-12-20 DIAGNOSIS — I480 Paroxysmal atrial fibrillation: Secondary | ICD-10-CM | POA: Diagnosis not present

## 2021-12-20 DIAGNOSIS — G20 Parkinson's disease: Secondary | ICD-10-CM | POA: Diagnosis not present

## 2021-12-20 DIAGNOSIS — E230 Hypopituitarism: Secondary | ICD-10-CM | POA: Diagnosis not present

## 2021-12-20 DIAGNOSIS — I5032 Chronic diastolic (congestive) heart failure: Secondary | ICD-10-CM | POA: Diagnosis not present

## 2021-12-20 DIAGNOSIS — F320 Major depressive disorder, single episode, mild: Secondary | ICD-10-CM | POA: Diagnosis not present

## 2021-12-20 DIAGNOSIS — I110 Hypertensive heart disease with heart failure: Secondary | ICD-10-CM | POA: Diagnosis not present

## 2021-12-23 DIAGNOSIS — E230 Hypopituitarism: Secondary | ICD-10-CM | POA: Diagnosis not present

## 2021-12-23 DIAGNOSIS — G20 Parkinson's disease: Secondary | ICD-10-CM | POA: Diagnosis not present

## 2021-12-23 DIAGNOSIS — I5032 Chronic diastolic (congestive) heart failure: Secondary | ICD-10-CM | POA: Diagnosis not present

## 2021-12-23 DIAGNOSIS — I480 Paroxysmal atrial fibrillation: Secondary | ICD-10-CM | POA: Diagnosis not present

## 2021-12-23 DIAGNOSIS — F320 Major depressive disorder, single episode, mild: Secondary | ICD-10-CM | POA: Diagnosis not present

## 2021-12-23 DIAGNOSIS — I110 Hypertensive heart disease with heart failure: Secondary | ICD-10-CM | POA: Diagnosis not present

## 2021-12-30 DIAGNOSIS — F320 Major depressive disorder, single episode, mild: Secondary | ICD-10-CM | POA: Diagnosis not present

## 2021-12-30 DIAGNOSIS — G20 Parkinson's disease: Secondary | ICD-10-CM | POA: Diagnosis not present

## 2021-12-30 DIAGNOSIS — I5032 Chronic diastolic (congestive) heart failure: Secondary | ICD-10-CM | POA: Diagnosis not present

## 2021-12-30 DIAGNOSIS — I110 Hypertensive heart disease with heart failure: Secondary | ICD-10-CM | POA: Diagnosis not present

## 2021-12-30 DIAGNOSIS — E230 Hypopituitarism: Secondary | ICD-10-CM | POA: Diagnosis not present

## 2021-12-30 DIAGNOSIS — I480 Paroxysmal atrial fibrillation: Secondary | ICD-10-CM | POA: Diagnosis not present

## 2022-01-04 DIAGNOSIS — I480 Paroxysmal atrial fibrillation: Secondary | ICD-10-CM | POA: Diagnosis not present

## 2022-01-04 DIAGNOSIS — I5032 Chronic diastolic (congestive) heart failure: Secondary | ICD-10-CM | POA: Diagnosis not present

## 2022-01-04 DIAGNOSIS — G20 Parkinson's disease: Secondary | ICD-10-CM | POA: Diagnosis not present

## 2022-01-04 DIAGNOSIS — I110 Hypertensive heart disease with heart failure: Secondary | ICD-10-CM | POA: Diagnosis not present

## 2022-01-04 DIAGNOSIS — E230 Hypopituitarism: Secondary | ICD-10-CM | POA: Diagnosis not present

## 2022-01-04 DIAGNOSIS — F320 Major depressive disorder, single episode, mild: Secondary | ICD-10-CM | POA: Diagnosis not present

## 2022-01-06 DIAGNOSIS — F320 Major depressive disorder, single episode, mild: Secondary | ICD-10-CM | POA: Diagnosis not present

## 2022-01-06 DIAGNOSIS — E230 Hypopituitarism: Secondary | ICD-10-CM | POA: Diagnosis not present

## 2022-01-06 DIAGNOSIS — G20 Parkinson's disease: Secondary | ICD-10-CM | POA: Diagnosis not present

## 2022-01-06 DIAGNOSIS — I5032 Chronic diastolic (congestive) heart failure: Secondary | ICD-10-CM | POA: Diagnosis not present

## 2022-01-06 DIAGNOSIS — I110 Hypertensive heart disease with heart failure: Secondary | ICD-10-CM | POA: Diagnosis not present

## 2022-01-06 DIAGNOSIS — I480 Paroxysmal atrial fibrillation: Secondary | ICD-10-CM | POA: Diagnosis not present

## 2022-01-08 DIAGNOSIS — G20 Parkinson's disease: Secondary | ICD-10-CM | POA: Diagnosis not present

## 2022-01-08 DIAGNOSIS — M5441 Lumbago with sciatica, right side: Secondary | ICD-10-CM | POA: Diagnosis not present

## 2022-01-08 DIAGNOSIS — E230 Hypopituitarism: Secondary | ICD-10-CM | POA: Diagnosis not present

## 2022-01-08 DIAGNOSIS — I110 Hypertensive heart disease with heart failure: Secondary | ICD-10-CM | POA: Diagnosis not present

## 2022-01-08 DIAGNOSIS — E039 Hypothyroidism, unspecified: Secondary | ICD-10-CM | POA: Diagnosis not present

## 2022-01-08 DIAGNOSIS — F320 Major depressive disorder, single episode, mild: Secondary | ICD-10-CM | POA: Diagnosis not present

## 2022-01-08 DIAGNOSIS — I5032 Chronic diastolic (congestive) heart failure: Secondary | ICD-10-CM | POA: Diagnosis not present

## 2022-01-08 DIAGNOSIS — I480 Paroxysmal atrial fibrillation: Secondary | ICD-10-CM | POA: Diagnosis not present

## 2022-01-08 DIAGNOSIS — Z66 Do not resuscitate: Secondary | ICD-10-CM | POA: Diagnosis not present

## 2022-01-08 DIAGNOSIS — Z8546 Personal history of malignant neoplasm of prostate: Secondary | ICD-10-CM | POA: Diagnosis not present

## 2022-01-08 DIAGNOSIS — M1000 Idiopathic gout, unspecified site: Secondary | ICD-10-CM | POA: Diagnosis not present

## 2022-01-08 DIAGNOSIS — M5442 Lumbago with sciatica, left side: Secondary | ICD-10-CM | POA: Diagnosis not present

## 2022-01-10 DIAGNOSIS — G20 Parkinson's disease: Secondary | ICD-10-CM | POA: Diagnosis not present

## 2022-01-10 DIAGNOSIS — F320 Major depressive disorder, single episode, mild: Secondary | ICD-10-CM | POA: Diagnosis not present

## 2022-01-10 DIAGNOSIS — I110 Hypertensive heart disease with heart failure: Secondary | ICD-10-CM | POA: Diagnosis not present

## 2022-01-10 DIAGNOSIS — I5032 Chronic diastolic (congestive) heart failure: Secondary | ICD-10-CM | POA: Diagnosis not present

## 2022-01-10 DIAGNOSIS — E230 Hypopituitarism: Secondary | ICD-10-CM | POA: Diagnosis not present

## 2022-01-10 DIAGNOSIS — I480 Paroxysmal atrial fibrillation: Secondary | ICD-10-CM | POA: Diagnosis not present

## 2022-01-13 DIAGNOSIS — G20 Parkinson's disease: Secondary | ICD-10-CM | POA: Diagnosis not present

## 2022-01-13 DIAGNOSIS — E230 Hypopituitarism: Secondary | ICD-10-CM | POA: Diagnosis not present

## 2022-01-13 DIAGNOSIS — I5032 Chronic diastolic (congestive) heart failure: Secondary | ICD-10-CM | POA: Diagnosis not present

## 2022-01-13 DIAGNOSIS — F320 Major depressive disorder, single episode, mild: Secondary | ICD-10-CM | POA: Diagnosis not present

## 2022-01-13 DIAGNOSIS — I480 Paroxysmal atrial fibrillation: Secondary | ICD-10-CM | POA: Diagnosis not present

## 2022-01-13 DIAGNOSIS — I110 Hypertensive heart disease with heart failure: Secondary | ICD-10-CM | POA: Diagnosis not present

## 2022-01-18 DIAGNOSIS — I5032 Chronic diastolic (congestive) heart failure: Secondary | ICD-10-CM | POA: Diagnosis not present

## 2022-01-18 DIAGNOSIS — F320 Major depressive disorder, single episode, mild: Secondary | ICD-10-CM | POA: Diagnosis not present

## 2022-01-18 DIAGNOSIS — E230 Hypopituitarism: Secondary | ICD-10-CM | POA: Diagnosis not present

## 2022-01-18 DIAGNOSIS — I480 Paroxysmal atrial fibrillation: Secondary | ICD-10-CM | POA: Diagnosis not present

## 2022-01-18 DIAGNOSIS — I110 Hypertensive heart disease with heart failure: Secondary | ICD-10-CM | POA: Diagnosis not present

## 2022-01-18 DIAGNOSIS — G20 Parkinson's disease: Secondary | ICD-10-CM | POA: Diagnosis not present

## 2022-01-20 DIAGNOSIS — I110 Hypertensive heart disease with heart failure: Secondary | ICD-10-CM | POA: Diagnosis not present

## 2022-01-20 DIAGNOSIS — F320 Major depressive disorder, single episode, mild: Secondary | ICD-10-CM | POA: Diagnosis not present

## 2022-01-20 DIAGNOSIS — E230 Hypopituitarism: Secondary | ICD-10-CM | POA: Diagnosis not present

## 2022-01-20 DIAGNOSIS — I480 Paroxysmal atrial fibrillation: Secondary | ICD-10-CM | POA: Diagnosis not present

## 2022-01-20 DIAGNOSIS — G20 Parkinson's disease: Secondary | ICD-10-CM | POA: Diagnosis not present

## 2022-01-20 DIAGNOSIS — I5032 Chronic diastolic (congestive) heart failure: Secondary | ICD-10-CM | POA: Diagnosis not present

## 2022-01-25 DIAGNOSIS — I480 Paroxysmal atrial fibrillation: Secondary | ICD-10-CM | POA: Diagnosis not present

## 2022-01-25 DIAGNOSIS — I5032 Chronic diastolic (congestive) heart failure: Secondary | ICD-10-CM | POA: Diagnosis not present

## 2022-01-25 DIAGNOSIS — I110 Hypertensive heart disease with heart failure: Secondary | ICD-10-CM | POA: Diagnosis not present

## 2022-01-25 DIAGNOSIS — F320 Major depressive disorder, single episode, mild: Secondary | ICD-10-CM | POA: Diagnosis not present

## 2022-01-25 DIAGNOSIS — G20 Parkinson's disease: Secondary | ICD-10-CM | POA: Diagnosis not present

## 2022-01-25 DIAGNOSIS — E230 Hypopituitarism: Secondary | ICD-10-CM | POA: Diagnosis not present

## 2022-01-28 DIAGNOSIS — F320 Major depressive disorder, single episode, mild: Secondary | ICD-10-CM | POA: Diagnosis not present

## 2022-01-28 DIAGNOSIS — I110 Hypertensive heart disease with heart failure: Secondary | ICD-10-CM | POA: Diagnosis not present

## 2022-01-28 DIAGNOSIS — I480 Paroxysmal atrial fibrillation: Secondary | ICD-10-CM | POA: Diagnosis not present

## 2022-01-28 DIAGNOSIS — G20 Parkinson's disease: Secondary | ICD-10-CM | POA: Diagnosis not present

## 2022-01-28 DIAGNOSIS — I5032 Chronic diastolic (congestive) heart failure: Secondary | ICD-10-CM | POA: Diagnosis not present

## 2022-01-28 DIAGNOSIS — E230 Hypopituitarism: Secondary | ICD-10-CM | POA: Diagnosis not present

## 2022-01-29 DIAGNOSIS — I110 Hypertensive heart disease with heart failure: Secondary | ICD-10-CM | POA: Diagnosis not present

## 2022-01-29 DIAGNOSIS — G20 Parkinson's disease: Secondary | ICD-10-CM | POA: Diagnosis not present

## 2022-01-29 DIAGNOSIS — I5032 Chronic diastolic (congestive) heart failure: Secondary | ICD-10-CM | POA: Diagnosis not present

## 2022-01-29 DIAGNOSIS — F320 Major depressive disorder, single episode, mild: Secondary | ICD-10-CM | POA: Diagnosis not present

## 2022-01-29 DIAGNOSIS — I480 Paroxysmal atrial fibrillation: Secondary | ICD-10-CM | POA: Diagnosis not present

## 2022-01-29 DIAGNOSIS — E230 Hypopituitarism: Secondary | ICD-10-CM | POA: Diagnosis not present

## 2022-02-02 DIAGNOSIS — I110 Hypertensive heart disease with heart failure: Secondary | ICD-10-CM | POA: Diagnosis not present

## 2022-02-02 DIAGNOSIS — G20 Parkinson's disease: Secondary | ICD-10-CM | POA: Diagnosis not present

## 2022-02-02 DIAGNOSIS — F320 Major depressive disorder, single episode, mild: Secondary | ICD-10-CM | POA: Diagnosis not present

## 2022-02-02 DIAGNOSIS — E230 Hypopituitarism: Secondary | ICD-10-CM | POA: Diagnosis not present

## 2022-02-02 DIAGNOSIS — I5032 Chronic diastolic (congestive) heart failure: Secondary | ICD-10-CM | POA: Diagnosis not present

## 2022-02-02 DIAGNOSIS — I480 Paroxysmal atrial fibrillation: Secondary | ICD-10-CM | POA: Diagnosis not present

## 2022-02-04 DIAGNOSIS — G20 Parkinson's disease: Secondary | ICD-10-CM | POA: Diagnosis not present

## 2022-02-04 DIAGNOSIS — I5032 Chronic diastolic (congestive) heart failure: Secondary | ICD-10-CM | POA: Diagnosis not present

## 2022-02-04 DIAGNOSIS — E230 Hypopituitarism: Secondary | ICD-10-CM | POA: Diagnosis not present

## 2022-02-04 DIAGNOSIS — I480 Paroxysmal atrial fibrillation: Secondary | ICD-10-CM | POA: Diagnosis not present

## 2022-02-04 DIAGNOSIS — I110 Hypertensive heart disease with heart failure: Secondary | ICD-10-CM | POA: Diagnosis not present

## 2022-02-04 DIAGNOSIS — F320 Major depressive disorder, single episode, mild: Secondary | ICD-10-CM | POA: Diagnosis not present

## 2022-02-06 DIAGNOSIS — E230 Hypopituitarism: Secondary | ICD-10-CM | POA: Diagnosis not present

## 2022-02-06 DIAGNOSIS — I5032 Chronic diastolic (congestive) heart failure: Secondary | ICD-10-CM | POA: Diagnosis not present

## 2022-02-06 DIAGNOSIS — I110 Hypertensive heart disease with heart failure: Secondary | ICD-10-CM | POA: Diagnosis not present

## 2022-02-06 DIAGNOSIS — G20 Parkinson's disease: Secondary | ICD-10-CM | POA: Diagnosis not present

## 2022-02-06 DIAGNOSIS — I480 Paroxysmal atrial fibrillation: Secondary | ICD-10-CM | POA: Diagnosis not present

## 2022-02-06 DIAGNOSIS — F320 Major depressive disorder, single episode, mild: Secondary | ICD-10-CM | POA: Diagnosis not present

## 2022-02-08 DIAGNOSIS — M5442 Lumbago with sciatica, left side: Secondary | ICD-10-CM | POA: Diagnosis not present

## 2022-02-08 DIAGNOSIS — E039 Hypothyroidism, unspecified: Secondary | ICD-10-CM | POA: Diagnosis not present

## 2022-02-08 DIAGNOSIS — M1000 Idiopathic gout, unspecified site: Secondary | ICD-10-CM | POA: Diagnosis not present

## 2022-02-08 DIAGNOSIS — E230 Hypopituitarism: Secondary | ICD-10-CM | POA: Diagnosis not present

## 2022-02-08 DIAGNOSIS — I110 Hypertensive heart disease with heart failure: Secondary | ICD-10-CM | POA: Diagnosis not present

## 2022-02-08 DIAGNOSIS — G20 Parkinson's disease: Secondary | ICD-10-CM | POA: Diagnosis not present

## 2022-02-08 DIAGNOSIS — I480 Paroxysmal atrial fibrillation: Secondary | ICD-10-CM | POA: Diagnosis not present

## 2022-02-08 DIAGNOSIS — F320 Major depressive disorder, single episode, mild: Secondary | ICD-10-CM | POA: Diagnosis not present

## 2022-02-08 DIAGNOSIS — I5032 Chronic diastolic (congestive) heart failure: Secondary | ICD-10-CM | POA: Diagnosis not present

## 2022-02-08 DIAGNOSIS — Z8546 Personal history of malignant neoplasm of prostate: Secondary | ICD-10-CM | POA: Diagnosis not present

## 2022-02-08 DIAGNOSIS — M5441 Lumbago with sciatica, right side: Secondary | ICD-10-CM | POA: Diagnosis not present

## 2022-02-08 DIAGNOSIS — Z66 Do not resuscitate: Secondary | ICD-10-CM | POA: Diagnosis not present

## 2022-02-12 DIAGNOSIS — I110 Hypertensive heart disease with heart failure: Secondary | ICD-10-CM | POA: Diagnosis not present

## 2022-02-12 DIAGNOSIS — E230 Hypopituitarism: Secondary | ICD-10-CM | POA: Diagnosis not present

## 2022-02-12 DIAGNOSIS — I480 Paroxysmal atrial fibrillation: Secondary | ICD-10-CM | POA: Diagnosis not present

## 2022-02-12 DIAGNOSIS — F320 Major depressive disorder, single episode, mild: Secondary | ICD-10-CM | POA: Diagnosis not present

## 2022-02-12 DIAGNOSIS — I5032 Chronic diastolic (congestive) heart failure: Secondary | ICD-10-CM | POA: Diagnosis not present

## 2022-02-12 DIAGNOSIS — G20 Parkinson's disease: Secondary | ICD-10-CM | POA: Diagnosis not present

## 2022-02-16 DIAGNOSIS — I480 Paroxysmal atrial fibrillation: Secondary | ICD-10-CM | POA: Diagnosis not present

## 2022-02-16 DIAGNOSIS — I5032 Chronic diastolic (congestive) heart failure: Secondary | ICD-10-CM | POA: Diagnosis not present

## 2022-02-16 DIAGNOSIS — E230 Hypopituitarism: Secondary | ICD-10-CM | POA: Diagnosis not present

## 2022-02-16 DIAGNOSIS — I110 Hypertensive heart disease with heart failure: Secondary | ICD-10-CM | POA: Diagnosis not present

## 2022-02-16 DIAGNOSIS — F320 Major depressive disorder, single episode, mild: Secondary | ICD-10-CM | POA: Diagnosis not present

## 2022-02-16 DIAGNOSIS — G20 Parkinson's disease: Secondary | ICD-10-CM | POA: Diagnosis not present

## 2022-02-22 DIAGNOSIS — I480 Paroxysmal atrial fibrillation: Secondary | ICD-10-CM | POA: Diagnosis not present

## 2022-02-22 DIAGNOSIS — G20 Parkinson's disease: Secondary | ICD-10-CM | POA: Diagnosis not present

## 2022-02-22 DIAGNOSIS — F320 Major depressive disorder, single episode, mild: Secondary | ICD-10-CM | POA: Diagnosis not present

## 2022-02-22 DIAGNOSIS — I110 Hypertensive heart disease with heart failure: Secondary | ICD-10-CM | POA: Diagnosis not present

## 2022-02-22 DIAGNOSIS — I5032 Chronic diastolic (congestive) heart failure: Secondary | ICD-10-CM | POA: Diagnosis not present

## 2022-02-22 DIAGNOSIS — E230 Hypopituitarism: Secondary | ICD-10-CM | POA: Diagnosis not present

## 2022-02-25 DIAGNOSIS — F320 Major depressive disorder, single episode, mild: Secondary | ICD-10-CM | POA: Diagnosis not present

## 2022-02-25 DIAGNOSIS — I480 Paroxysmal atrial fibrillation: Secondary | ICD-10-CM | POA: Diagnosis not present

## 2022-02-25 DIAGNOSIS — G20 Parkinson's disease: Secondary | ICD-10-CM | POA: Diagnosis not present

## 2022-02-25 DIAGNOSIS — I110 Hypertensive heart disease with heart failure: Secondary | ICD-10-CM | POA: Diagnosis not present

## 2022-02-25 DIAGNOSIS — I5032 Chronic diastolic (congestive) heart failure: Secondary | ICD-10-CM | POA: Diagnosis not present

## 2022-02-25 DIAGNOSIS — E230 Hypopituitarism: Secondary | ICD-10-CM | POA: Diagnosis not present

## 2022-03-08 DIAGNOSIS — F320 Major depressive disorder, single episode, mild: Secondary | ICD-10-CM | POA: Diagnosis not present

## 2022-03-08 DIAGNOSIS — E230 Hypopituitarism: Secondary | ICD-10-CM | POA: Diagnosis not present

## 2022-03-08 DIAGNOSIS — I110 Hypertensive heart disease with heart failure: Secondary | ICD-10-CM | POA: Diagnosis not present

## 2022-03-08 DIAGNOSIS — I480 Paroxysmal atrial fibrillation: Secondary | ICD-10-CM | POA: Diagnosis not present

## 2022-03-08 DIAGNOSIS — G20 Parkinson's disease: Secondary | ICD-10-CM | POA: Diagnosis not present

## 2022-03-08 DIAGNOSIS — I5032 Chronic diastolic (congestive) heart failure: Secondary | ICD-10-CM | POA: Diagnosis not present

## 2022-03-10 DIAGNOSIS — I110 Hypertensive heart disease with heart failure: Secondary | ICD-10-CM | POA: Diagnosis not present

## 2022-03-10 DIAGNOSIS — I480 Paroxysmal atrial fibrillation: Secondary | ICD-10-CM | POA: Diagnosis not present

## 2022-03-10 DIAGNOSIS — I5032 Chronic diastolic (congestive) heart failure: Secondary | ICD-10-CM | POA: Diagnosis not present

## 2022-03-10 DIAGNOSIS — G20 Parkinson's disease: Secondary | ICD-10-CM | POA: Diagnosis not present

## 2022-03-10 DIAGNOSIS — F320 Major depressive disorder, single episode, mild: Secondary | ICD-10-CM | POA: Diagnosis not present

## 2022-03-10 DIAGNOSIS — E230 Hypopituitarism: Secondary | ICD-10-CM | POA: Diagnosis not present

## 2022-03-11 DIAGNOSIS — I480 Paroxysmal atrial fibrillation: Secondary | ICD-10-CM | POA: Diagnosis not present

## 2022-03-11 DIAGNOSIS — M5441 Lumbago with sciatica, right side: Secondary | ICD-10-CM | POA: Diagnosis not present

## 2022-03-11 DIAGNOSIS — M5442 Lumbago with sciatica, left side: Secondary | ICD-10-CM | POA: Diagnosis not present

## 2022-03-11 DIAGNOSIS — M1000 Idiopathic gout, unspecified site: Secondary | ICD-10-CM | POA: Diagnosis not present

## 2022-03-11 DIAGNOSIS — E039 Hypothyroidism, unspecified: Secondary | ICD-10-CM | POA: Diagnosis not present

## 2022-03-11 DIAGNOSIS — G20 Parkinson's disease: Secondary | ICD-10-CM | POA: Diagnosis not present

## 2022-03-11 DIAGNOSIS — I5032 Chronic diastolic (congestive) heart failure: Secondary | ICD-10-CM | POA: Diagnosis not present

## 2022-03-11 DIAGNOSIS — F320 Major depressive disorder, single episode, mild: Secondary | ICD-10-CM | POA: Diagnosis not present

## 2022-03-11 DIAGNOSIS — E230 Hypopituitarism: Secondary | ICD-10-CM | POA: Diagnosis not present

## 2022-03-11 DIAGNOSIS — I110 Hypertensive heart disease with heart failure: Secondary | ICD-10-CM | POA: Diagnosis not present

## 2022-03-11 DIAGNOSIS — Z8546 Personal history of malignant neoplasm of prostate: Secondary | ICD-10-CM | POA: Diagnosis not present

## 2022-03-11 DIAGNOSIS — Z66 Do not resuscitate: Secondary | ICD-10-CM | POA: Diagnosis not present

## 2022-03-15 DIAGNOSIS — I110 Hypertensive heart disease with heart failure: Secondary | ICD-10-CM | POA: Diagnosis not present

## 2022-03-15 DIAGNOSIS — I480 Paroxysmal atrial fibrillation: Secondary | ICD-10-CM | POA: Diagnosis not present

## 2022-03-15 DIAGNOSIS — E230 Hypopituitarism: Secondary | ICD-10-CM | POA: Diagnosis not present

## 2022-03-15 DIAGNOSIS — F320 Major depressive disorder, single episode, mild: Secondary | ICD-10-CM | POA: Diagnosis not present

## 2022-03-15 DIAGNOSIS — G20 Parkinson's disease: Secondary | ICD-10-CM | POA: Diagnosis not present

## 2022-03-15 DIAGNOSIS — I5032 Chronic diastolic (congestive) heart failure: Secondary | ICD-10-CM | POA: Diagnosis not present

## 2022-03-17 DIAGNOSIS — I110 Hypertensive heart disease with heart failure: Secondary | ICD-10-CM | POA: Diagnosis not present

## 2022-03-17 DIAGNOSIS — I5032 Chronic diastolic (congestive) heart failure: Secondary | ICD-10-CM | POA: Diagnosis not present

## 2022-03-17 DIAGNOSIS — G20 Parkinson's disease: Secondary | ICD-10-CM | POA: Diagnosis not present

## 2022-03-17 DIAGNOSIS — E230 Hypopituitarism: Secondary | ICD-10-CM | POA: Diagnosis not present

## 2022-03-17 DIAGNOSIS — F320 Major depressive disorder, single episode, mild: Secondary | ICD-10-CM | POA: Diagnosis not present

## 2022-03-17 DIAGNOSIS — I480 Paroxysmal atrial fibrillation: Secondary | ICD-10-CM | POA: Diagnosis not present

## 2022-03-21 DIAGNOSIS — G20 Parkinson's disease: Secondary | ICD-10-CM | POA: Diagnosis not present

## 2022-03-21 DIAGNOSIS — I5032 Chronic diastolic (congestive) heart failure: Secondary | ICD-10-CM | POA: Diagnosis not present

## 2022-03-21 DIAGNOSIS — I110 Hypertensive heart disease with heart failure: Secondary | ICD-10-CM | POA: Diagnosis not present

## 2022-03-21 DIAGNOSIS — E230 Hypopituitarism: Secondary | ICD-10-CM | POA: Diagnosis not present

## 2022-03-21 DIAGNOSIS — I480 Paroxysmal atrial fibrillation: Secondary | ICD-10-CM | POA: Diagnosis not present

## 2022-03-21 DIAGNOSIS — F320 Major depressive disorder, single episode, mild: Secondary | ICD-10-CM | POA: Diagnosis not present

## 2022-03-22 DIAGNOSIS — F320 Major depressive disorder, single episode, mild: Secondary | ICD-10-CM | POA: Diagnosis not present

## 2022-03-22 DIAGNOSIS — I110 Hypertensive heart disease with heart failure: Secondary | ICD-10-CM | POA: Diagnosis not present

## 2022-03-22 DIAGNOSIS — I5032 Chronic diastolic (congestive) heart failure: Secondary | ICD-10-CM | POA: Diagnosis not present

## 2022-03-22 DIAGNOSIS — G20 Parkinson's disease: Secondary | ICD-10-CM | POA: Diagnosis not present

## 2022-03-22 DIAGNOSIS — E230 Hypopituitarism: Secondary | ICD-10-CM | POA: Diagnosis not present

## 2022-03-22 DIAGNOSIS — I480 Paroxysmal atrial fibrillation: Secondary | ICD-10-CM | POA: Diagnosis not present

## 2022-03-24 DIAGNOSIS — I5032 Chronic diastolic (congestive) heart failure: Secondary | ICD-10-CM | POA: Diagnosis not present

## 2022-03-24 DIAGNOSIS — I110 Hypertensive heart disease with heart failure: Secondary | ICD-10-CM | POA: Diagnosis not present

## 2022-03-24 DIAGNOSIS — I480 Paroxysmal atrial fibrillation: Secondary | ICD-10-CM | POA: Diagnosis not present

## 2022-03-24 DIAGNOSIS — E230 Hypopituitarism: Secondary | ICD-10-CM | POA: Diagnosis not present

## 2022-03-24 DIAGNOSIS — G20 Parkinson's disease: Secondary | ICD-10-CM | POA: Diagnosis not present

## 2022-03-24 DIAGNOSIS — F320 Major depressive disorder, single episode, mild: Secondary | ICD-10-CM | POA: Diagnosis not present

## 2022-03-26 DIAGNOSIS — G20 Parkinson's disease: Secondary | ICD-10-CM | POA: Diagnosis not present

## 2022-03-26 DIAGNOSIS — I110 Hypertensive heart disease with heart failure: Secondary | ICD-10-CM | POA: Diagnosis not present

## 2022-03-26 DIAGNOSIS — I5032 Chronic diastolic (congestive) heart failure: Secondary | ICD-10-CM | POA: Diagnosis not present

## 2022-03-26 DIAGNOSIS — E230 Hypopituitarism: Secondary | ICD-10-CM | POA: Diagnosis not present

## 2022-03-26 DIAGNOSIS — I480 Paroxysmal atrial fibrillation: Secondary | ICD-10-CM | POA: Diagnosis not present

## 2022-03-26 DIAGNOSIS — F320 Major depressive disorder, single episode, mild: Secondary | ICD-10-CM | POA: Diagnosis not present

## 2022-03-27 DIAGNOSIS — I5032 Chronic diastolic (congestive) heart failure: Secondary | ICD-10-CM | POA: Diagnosis not present

## 2022-03-27 DIAGNOSIS — F320 Major depressive disorder, single episode, mild: Secondary | ICD-10-CM | POA: Diagnosis not present

## 2022-03-27 DIAGNOSIS — I110 Hypertensive heart disease with heart failure: Secondary | ICD-10-CM | POA: Diagnosis not present

## 2022-03-27 DIAGNOSIS — I480 Paroxysmal atrial fibrillation: Secondary | ICD-10-CM | POA: Diagnosis not present

## 2022-03-27 DIAGNOSIS — E230 Hypopituitarism: Secondary | ICD-10-CM | POA: Diagnosis not present

## 2022-03-27 DIAGNOSIS — G20 Parkinson's disease: Secondary | ICD-10-CM | POA: Diagnosis not present

## 2022-03-28 DIAGNOSIS — I5032 Chronic diastolic (congestive) heart failure: Secondary | ICD-10-CM | POA: Diagnosis not present

## 2022-03-28 DIAGNOSIS — I110 Hypertensive heart disease with heart failure: Secondary | ICD-10-CM | POA: Diagnosis not present

## 2022-03-28 DIAGNOSIS — E230 Hypopituitarism: Secondary | ICD-10-CM | POA: Diagnosis not present

## 2022-03-28 DIAGNOSIS — G20 Parkinson's disease: Secondary | ICD-10-CM | POA: Diagnosis not present

## 2022-03-28 DIAGNOSIS — F320 Major depressive disorder, single episode, mild: Secondary | ICD-10-CM | POA: Diagnosis not present

## 2022-03-28 DIAGNOSIS — I480 Paroxysmal atrial fibrillation: Secondary | ICD-10-CM | POA: Diagnosis not present

## 2022-03-29 DIAGNOSIS — E230 Hypopituitarism: Secondary | ICD-10-CM | POA: Diagnosis not present

## 2022-03-29 DIAGNOSIS — I5032 Chronic diastolic (congestive) heart failure: Secondary | ICD-10-CM | POA: Diagnosis not present

## 2022-03-29 DIAGNOSIS — I110 Hypertensive heart disease with heart failure: Secondary | ICD-10-CM | POA: Diagnosis not present

## 2022-03-29 DIAGNOSIS — I480 Paroxysmal atrial fibrillation: Secondary | ICD-10-CM | POA: Diagnosis not present

## 2022-03-29 DIAGNOSIS — G20 Parkinson's disease: Secondary | ICD-10-CM | POA: Diagnosis not present

## 2022-03-29 DIAGNOSIS — F320 Major depressive disorder, single episode, mild: Secondary | ICD-10-CM | POA: Diagnosis not present

## 2022-03-30 DIAGNOSIS — F320 Major depressive disorder, single episode, mild: Secondary | ICD-10-CM | POA: Diagnosis not present

## 2022-03-30 DIAGNOSIS — I480 Paroxysmal atrial fibrillation: Secondary | ICD-10-CM | POA: Diagnosis not present

## 2022-03-30 DIAGNOSIS — G20 Parkinson's disease: Secondary | ICD-10-CM | POA: Diagnosis not present

## 2022-03-30 DIAGNOSIS — I5032 Chronic diastolic (congestive) heart failure: Secondary | ICD-10-CM | POA: Diagnosis not present

## 2022-03-30 DIAGNOSIS — I110 Hypertensive heart disease with heart failure: Secondary | ICD-10-CM | POA: Diagnosis not present

## 2022-03-30 DIAGNOSIS — E230 Hypopituitarism: Secondary | ICD-10-CM | POA: Diagnosis not present

## 2022-03-31 DIAGNOSIS — I480 Paroxysmal atrial fibrillation: Secondary | ICD-10-CM | POA: Diagnosis not present

## 2022-03-31 DIAGNOSIS — I5032 Chronic diastolic (congestive) heart failure: Secondary | ICD-10-CM | POA: Diagnosis not present

## 2022-03-31 DIAGNOSIS — E230 Hypopituitarism: Secondary | ICD-10-CM | POA: Diagnosis not present

## 2022-03-31 DIAGNOSIS — G20 Parkinson's disease: Secondary | ICD-10-CM | POA: Diagnosis not present

## 2022-03-31 DIAGNOSIS — F320 Major depressive disorder, single episode, mild: Secondary | ICD-10-CM | POA: Diagnosis not present

## 2022-03-31 DIAGNOSIS — I110 Hypertensive heart disease with heart failure: Secondary | ICD-10-CM | POA: Diagnosis not present

## 2022-04-01 DIAGNOSIS — F320 Major depressive disorder, single episode, mild: Secondary | ICD-10-CM | POA: Diagnosis not present

## 2022-04-01 DIAGNOSIS — E230 Hypopituitarism: Secondary | ICD-10-CM | POA: Diagnosis not present

## 2022-04-01 DIAGNOSIS — I5032 Chronic diastolic (congestive) heart failure: Secondary | ICD-10-CM | POA: Diagnosis not present

## 2022-04-01 DIAGNOSIS — I480 Paroxysmal atrial fibrillation: Secondary | ICD-10-CM | POA: Diagnosis not present

## 2022-04-01 DIAGNOSIS — G20 Parkinson's disease: Secondary | ICD-10-CM | POA: Diagnosis not present

## 2022-04-01 DIAGNOSIS — I110 Hypertensive heart disease with heart failure: Secondary | ICD-10-CM | POA: Diagnosis not present

## 2022-04-02 DIAGNOSIS — I480 Paroxysmal atrial fibrillation: Secondary | ICD-10-CM | POA: Diagnosis not present

## 2022-04-02 DIAGNOSIS — E230 Hypopituitarism: Secondary | ICD-10-CM | POA: Diagnosis not present

## 2022-04-02 DIAGNOSIS — G20 Parkinson's disease: Secondary | ICD-10-CM | POA: Diagnosis not present

## 2022-04-02 DIAGNOSIS — I110 Hypertensive heart disease with heart failure: Secondary | ICD-10-CM | POA: Diagnosis not present

## 2022-04-02 DIAGNOSIS — F320 Major depressive disorder, single episode, mild: Secondary | ICD-10-CM | POA: Diagnosis not present

## 2022-04-02 DIAGNOSIS — I5032 Chronic diastolic (congestive) heart failure: Secondary | ICD-10-CM | POA: Diagnosis not present

## 2022-04-03 DIAGNOSIS — G20 Parkinson's disease: Secondary | ICD-10-CM | POA: Diagnosis not present

## 2022-04-03 DIAGNOSIS — I110 Hypertensive heart disease with heart failure: Secondary | ICD-10-CM | POA: Diagnosis not present

## 2022-04-03 DIAGNOSIS — F320 Major depressive disorder, single episode, mild: Secondary | ICD-10-CM | POA: Diagnosis not present

## 2022-04-03 DIAGNOSIS — I5032 Chronic diastolic (congestive) heart failure: Secondary | ICD-10-CM | POA: Diagnosis not present

## 2022-04-03 DIAGNOSIS — I480 Paroxysmal atrial fibrillation: Secondary | ICD-10-CM | POA: Diagnosis not present

## 2022-04-03 DIAGNOSIS — E230 Hypopituitarism: Secondary | ICD-10-CM | POA: Diagnosis not present

## 2022-04-04 DIAGNOSIS — I5032 Chronic diastolic (congestive) heart failure: Secondary | ICD-10-CM | POA: Diagnosis not present

## 2022-04-04 DIAGNOSIS — I110 Hypertensive heart disease with heart failure: Secondary | ICD-10-CM | POA: Diagnosis not present

## 2022-04-04 DIAGNOSIS — G20 Parkinson's disease: Secondary | ICD-10-CM | POA: Diagnosis not present

## 2022-04-04 DIAGNOSIS — F320 Major depressive disorder, single episode, mild: Secondary | ICD-10-CM | POA: Diagnosis not present

## 2022-04-04 DIAGNOSIS — I480 Paroxysmal atrial fibrillation: Secondary | ICD-10-CM | POA: Diagnosis not present

## 2022-04-04 DIAGNOSIS — E230 Hypopituitarism: Secondary | ICD-10-CM | POA: Diagnosis not present

## 2022-04-05 DIAGNOSIS — I110 Hypertensive heart disease with heart failure: Secondary | ICD-10-CM | POA: Diagnosis not present

## 2022-04-05 DIAGNOSIS — E230 Hypopituitarism: Secondary | ICD-10-CM | POA: Diagnosis not present

## 2022-04-05 DIAGNOSIS — I480 Paroxysmal atrial fibrillation: Secondary | ICD-10-CM | POA: Diagnosis not present

## 2022-04-05 DIAGNOSIS — F320 Major depressive disorder, single episode, mild: Secondary | ICD-10-CM | POA: Diagnosis not present

## 2022-04-05 DIAGNOSIS — G20 Parkinson's disease: Secondary | ICD-10-CM | POA: Diagnosis not present

## 2022-04-05 DIAGNOSIS — I5032 Chronic diastolic (congestive) heart failure: Secondary | ICD-10-CM | POA: Diagnosis not present

## 2022-04-06 DIAGNOSIS — E230 Hypopituitarism: Secondary | ICD-10-CM | POA: Diagnosis not present

## 2022-04-06 DIAGNOSIS — F320 Major depressive disorder, single episode, mild: Secondary | ICD-10-CM | POA: Diagnosis not present

## 2022-04-06 DIAGNOSIS — I110 Hypertensive heart disease with heart failure: Secondary | ICD-10-CM | POA: Diagnosis not present

## 2022-04-06 DIAGNOSIS — I5032 Chronic diastolic (congestive) heart failure: Secondary | ICD-10-CM | POA: Diagnosis not present

## 2022-04-06 DIAGNOSIS — I480 Paroxysmal atrial fibrillation: Secondary | ICD-10-CM | POA: Diagnosis not present

## 2022-04-06 DIAGNOSIS — G20 Parkinson's disease: Secondary | ICD-10-CM | POA: Diagnosis not present

## 2022-04-07 DIAGNOSIS — G20 Parkinson's disease: Secondary | ICD-10-CM | POA: Diagnosis not present

## 2022-04-07 DIAGNOSIS — I5032 Chronic diastolic (congestive) heart failure: Secondary | ICD-10-CM | POA: Diagnosis not present

## 2022-04-07 DIAGNOSIS — F320 Major depressive disorder, single episode, mild: Secondary | ICD-10-CM | POA: Diagnosis not present

## 2022-04-07 DIAGNOSIS — I110 Hypertensive heart disease with heart failure: Secondary | ICD-10-CM | POA: Diagnosis not present

## 2022-04-07 DIAGNOSIS — I480 Paroxysmal atrial fibrillation: Secondary | ICD-10-CM | POA: Diagnosis not present

## 2022-04-07 DIAGNOSIS — E230 Hypopituitarism: Secondary | ICD-10-CM | POA: Diagnosis not present

## 2022-04-08 DIAGNOSIS — I5032 Chronic diastolic (congestive) heart failure: Secondary | ICD-10-CM | POA: Diagnosis not present

## 2022-04-08 DIAGNOSIS — G20 Parkinson's disease: Secondary | ICD-10-CM | POA: Diagnosis not present

## 2022-04-08 DIAGNOSIS — E230 Hypopituitarism: Secondary | ICD-10-CM | POA: Diagnosis not present

## 2022-04-08 DIAGNOSIS — I110 Hypertensive heart disease with heart failure: Secondary | ICD-10-CM | POA: Diagnosis not present

## 2022-04-08 DIAGNOSIS — F320 Major depressive disorder, single episode, mild: Secondary | ICD-10-CM | POA: Diagnosis not present

## 2022-04-08 DIAGNOSIS — I480 Paroxysmal atrial fibrillation: Secondary | ICD-10-CM | POA: Diagnosis not present

## 2022-04-09 DIAGNOSIS — G20 Parkinson's disease: Secondary | ICD-10-CM | POA: Diagnosis not present

## 2022-04-09 DIAGNOSIS — E230 Hypopituitarism: Secondary | ICD-10-CM | POA: Diagnosis not present

## 2022-04-09 DIAGNOSIS — I110 Hypertensive heart disease with heart failure: Secondary | ICD-10-CM | POA: Diagnosis not present

## 2022-04-09 DIAGNOSIS — I5032 Chronic diastolic (congestive) heart failure: Secondary | ICD-10-CM | POA: Diagnosis not present

## 2022-04-09 DIAGNOSIS — I480 Paroxysmal atrial fibrillation: Secondary | ICD-10-CM | POA: Diagnosis not present

## 2022-04-09 DIAGNOSIS — F320 Major depressive disorder, single episode, mild: Secondary | ICD-10-CM | POA: Diagnosis not present

## 2022-04-10 DEATH — deceased
